# Patient Record
Sex: Female | Born: 1980 | Race: White | Hispanic: No | Marital: Married | Smoking: Former smoker
Health system: Southern US, Community
[De-identification: ages and names within clinical notes are randomized; demographics above are authoritative.]

## PROBLEM LIST (undated history)

## (undated) DIAGNOSIS — D649 Anemia, unspecified: Secondary | ICD-10-CM

## (undated) DIAGNOSIS — I2699 Other pulmonary embolism without acute cor pulmonale: Secondary | ICD-10-CM

## (undated) DIAGNOSIS — K7460 Unspecified cirrhosis of liver: Secondary | ICD-10-CM

## (undated) DIAGNOSIS — Z9889 Other specified postprocedural states: Secondary | ICD-10-CM

## (undated) DIAGNOSIS — R112 Nausea with vomiting, unspecified: Secondary | ICD-10-CM

## (undated) DIAGNOSIS — I82409 Acute embolism and thrombosis of unspecified deep veins of unspecified lower extremity: Secondary | ICD-10-CM

## (undated) DIAGNOSIS — G629 Polyneuropathy, unspecified: Secondary | ICD-10-CM

## (undated) DIAGNOSIS — F410 Panic disorder [episodic paroxysmal anxiety] without agoraphobia: Secondary | ICD-10-CM

## (undated) DIAGNOSIS — E611 Iron deficiency: Secondary | ICD-10-CM

## (undated) HISTORY — PX: SINUS IRRIGATION: SHX2411

## (undated) HISTORY — PX: ABDOMINAL HYSTERECTOMY: SHX81

## (undated) HISTORY — PX: CHOLECYSTECTOMY: SHX55

## (undated) HISTORY — PX: OTHER SURGICAL HISTORY: SHX169

## (undated) HISTORY — PX: TONSILLECTOMY: SUR1361

## (undated) HISTORY — PX: TUBAL LIGATION: SHX77

## (undated) HISTORY — PX: NASAL SINUS SURGERY: SHX719

---

## 2007-04-07 IMAGING — CR DG CHEST 2V
2 series · 2 of 2 positions shown · non-contrast
Comparison: None

CLINICAL DATA: Short of breath, cough, posterior chest pain for 2
days, smoking history

CHEST - 2 VIEW

[view not recorded (1 of 2)]
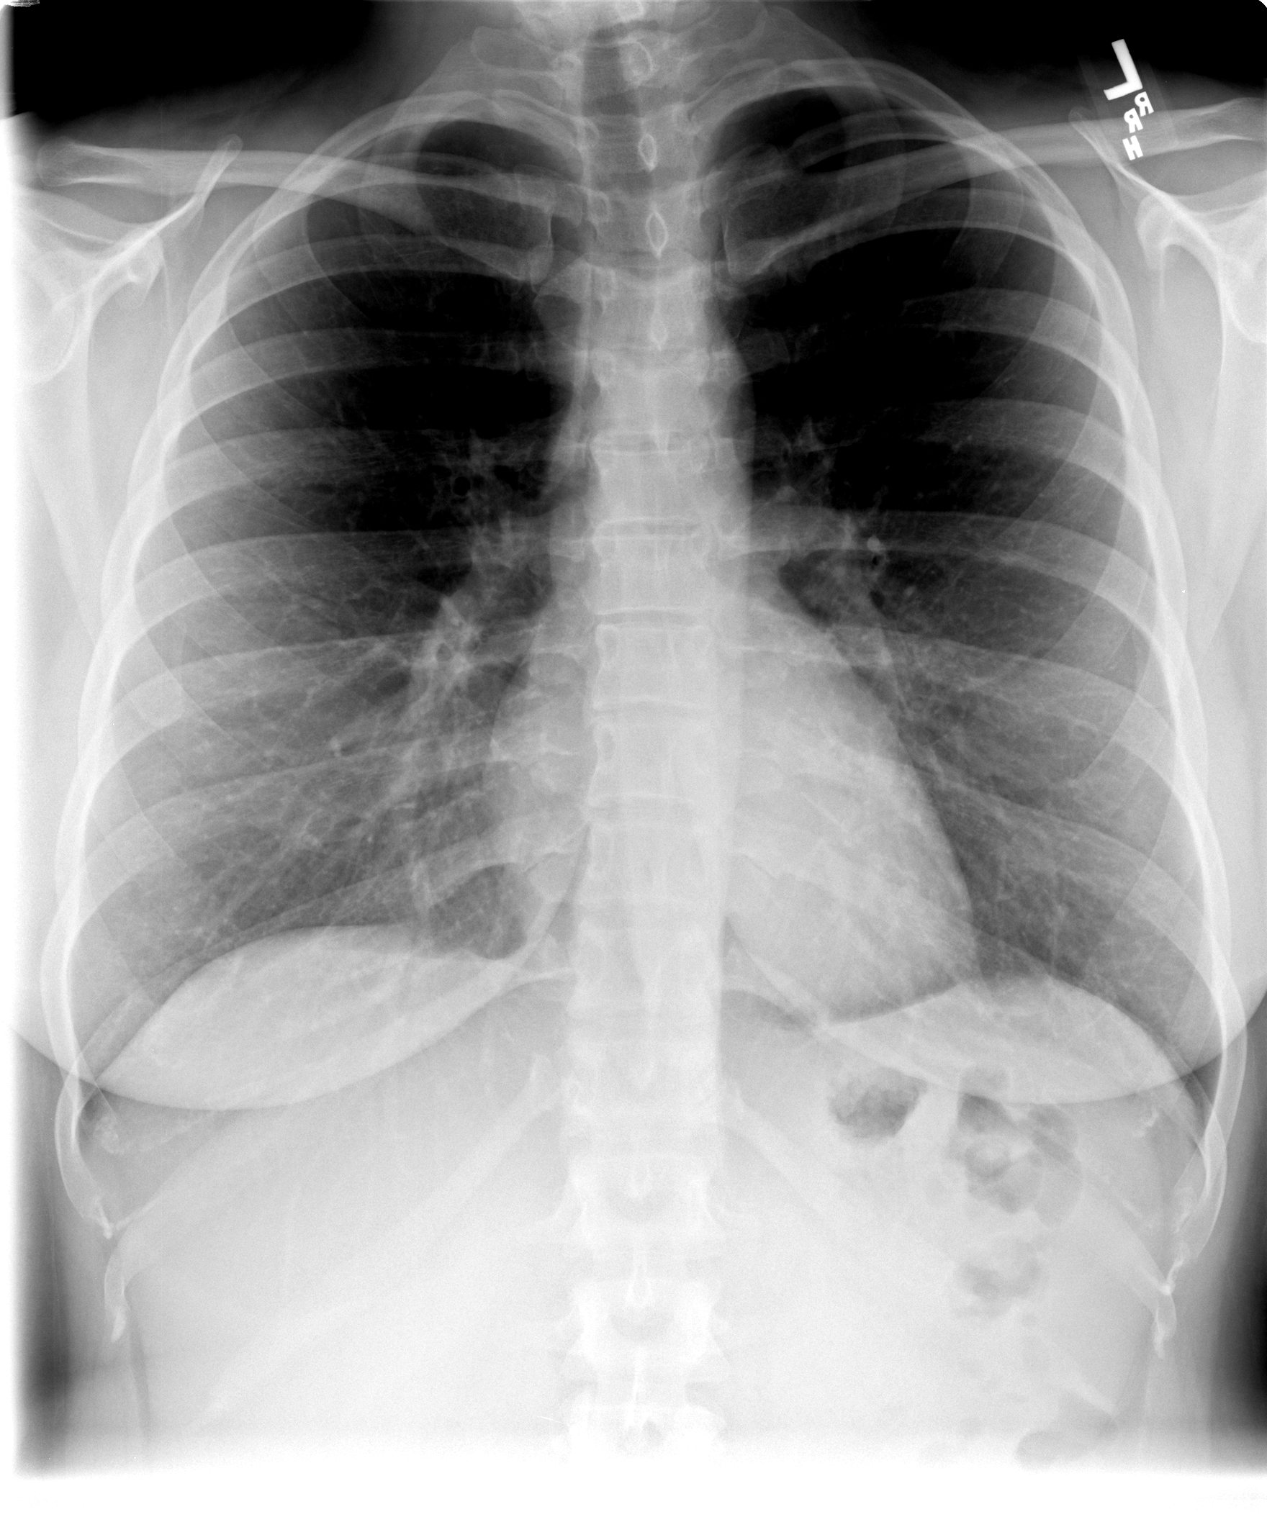

[view not recorded (2 of 2)]
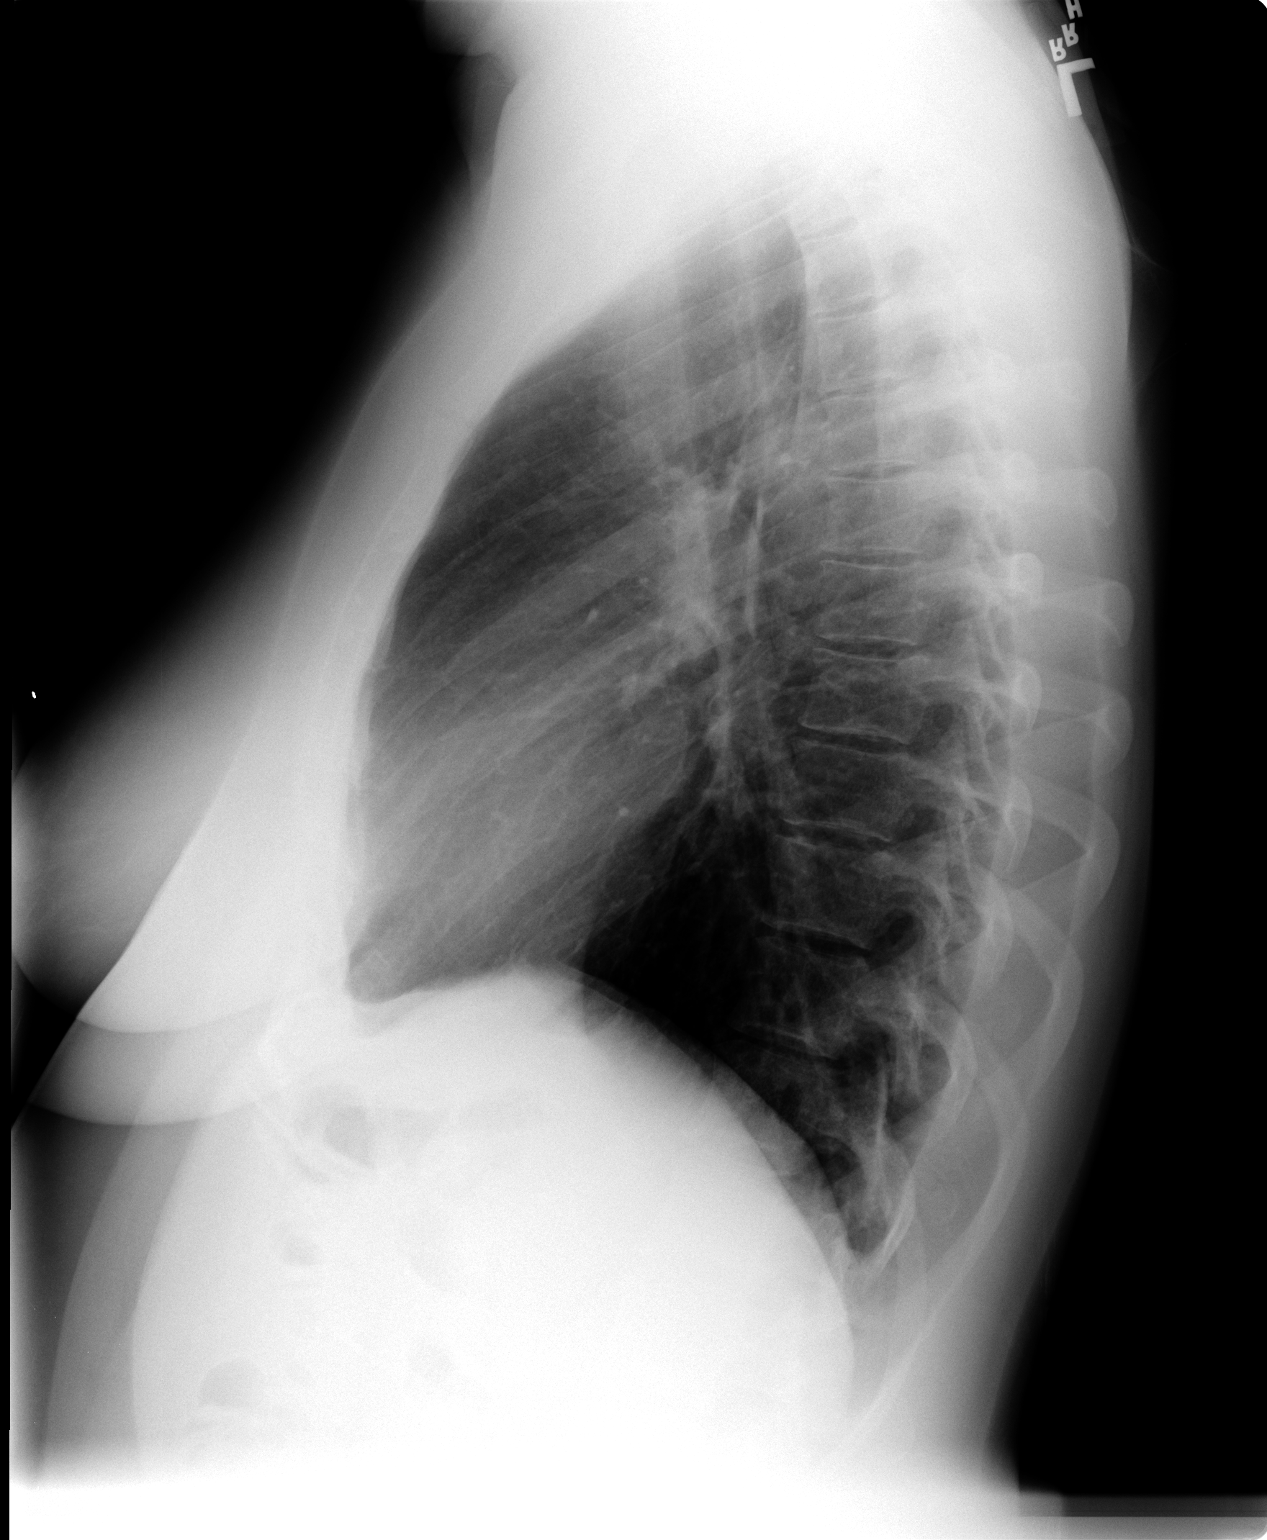

[2 of 2 positions shown; findings below may reference images not displayed]

FINDINGS: The lungs are clear.  The heart is within normal limits
in size.  No bony abnormality is seen.
IMPRESSION: No active lung disease.

## 2007-11-11 ENCOUNTER — Emergency Department (HOSPITAL_COMMUNITY): Admission: EM | Admit: 2007-11-11 | Discharge: 2007-11-11 | Payer: Self-pay | Admitting: Emergency Medicine

## 2008-07-26 ENCOUNTER — Emergency Department (HOSPITAL_COMMUNITY): Admission: EM | Admit: 2008-07-26 | Discharge: 2008-07-26 | Payer: Self-pay | Admitting: Emergency Medicine

## 2008-11-09 ENCOUNTER — Emergency Department (HOSPITAL_COMMUNITY): Admission: EM | Admit: 2008-11-09 | Discharge: 2008-11-10 | Payer: Self-pay | Admitting: Emergency Medicine
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
## 2008-11-23 ENCOUNTER — Ambulatory Visit (HOSPITAL_BASED_OUTPATIENT_CLINIC_OR_DEPARTMENT_OTHER): Admission: RE | Admit: 2008-11-23 | Discharge: 2008-11-23 | Payer: Self-pay | Admitting: Orthopedic Surgery

## 2008-11-29 ENCOUNTER — Encounter: Admission: RE | Admit: 2008-11-29 | Discharge: 2009-01-25 | Payer: Self-pay | Admitting: Orthopedic Surgery

## 2008-12-02 ENCOUNTER — Ambulatory Visit: Admission: RE | Admit: 2008-12-02 | Discharge: 2008-12-02 | Payer: Self-pay | Admitting: Orthopedic Surgery

## 2010-05-03 LAB — CBC
Hemoglobin: 16.1 g/dL — ABNORMAL HIGH (ref 12.0–15.0)
Platelets: 327 10*3/uL (ref 150–400)
RDW: 12.6 % (ref 11.5–15.5)

## 2010-05-03 LAB — DIFFERENTIAL
Basophils Absolute: 0.1 10*3/uL (ref 0.0–0.1)
Basophils Relative: 1 % (ref 0–1)
Eosinophils Absolute: 0.1 10*3/uL (ref 0.0–0.7)
Eosinophils Relative: 1 % (ref 0–5)
Lymphocytes Relative: 23 % (ref 12–46)
Lymphs Abs: 2.6 10*3/uL (ref 0.7–4.0)
Monocytes Absolute: 0.5 10*3/uL (ref 0.1–1.0)
Monocytes Relative: 5 % (ref 3–12)
Neutro Abs: 7.8 10*3/uL — ABNORMAL HIGH (ref 1.7–7.7)
Neutrophils Relative %: 71 % (ref 43–77)

## 2010-05-03 LAB — RAPID URINE DRUG SCREEN, HOSP PERFORMED
Amphetamines: NOT DETECTED
Barbiturates: NOT DETECTED
Benzodiazepines: POSITIVE — AB
Cocaine: NOT DETECTED
Opiates: POSITIVE — AB
Tetrahydrocannabinol: POSITIVE — AB

## 2010-05-03 LAB — COMPREHENSIVE METABOLIC PANEL
ALT: 26 U/L (ref 0–35)
AST: 32 U/L (ref 0–37)
Albumin: 4.9 g/dL (ref 3.5–5.2)
Alkaline Phosphatase: 51 U/L (ref 39–117)
BUN: 3 mg/dL — ABNORMAL LOW (ref 6–23)
CO2: 27 mEq/L (ref 19–32)
Calcium: 9.3 mg/dL (ref 8.4–10.5)
Chloride: 108 mEq/L (ref 96–112)
Creatinine, Ser: 0.67 mg/dL (ref 0.4–1.2)
GFR calc Af Amer: >60 mL/min (ref >60)
GFR calc non Af Amer: >60 mL/min (ref >60)
Glucose, Bld: 109 mg/dL — ABNORMAL HIGH (ref 70–99)
Potassium: 3.5 mEq/L (ref 3.5–5.1)
Sodium: 142 mEq/L (ref 135–145)
Total Bilirubin: 0.7 mg/dL (ref 0.3–1.2)
Total Protein: 7.7 g/dL (ref 6.0–8.3)

## 2010-05-03 LAB — URINALYSIS, ROUTINE W REFLEX MICROSCOPIC
Nitrite: NEGATIVE
Protein, ur: NEGATIVE mg/dL
Specific Gravity, Urine: 1.01 (ref 1.005–1.030)
pH: 7 (ref 5.0–8.0)

## 2010-05-03 LAB — POCT HEMOGLOBIN-HEMACUE: Hemoglobin: 14.4 g/dL (ref 12.0–15.0)

## 2010-05-03 LAB — PREGNANCY, URINE: Preg Test, Ur: NEGATIVE

## 2010-05-03 LAB — ETHANOL: Alcohol, Ethyl (B): 201 mg/dL — ABNORMAL HIGH (ref 0–10)

## 2010-05-07 LAB — RAPID STREP SCREEN (MED CTR MEBANE ONLY): Streptococcus, Group A Screen (Direct): NEGATIVE

## 2010-10-12 ENCOUNTER — Emergency Department (HOSPITAL_COMMUNITY)
Admission: EM | Admit: 2010-10-12 | Discharge: 2010-10-12 | Disposition: A | Discharge status: Home / Self Care | Payer: Medicaid Other | Attending: Emergency Medicine | Admitting: Emergency Medicine

## 2010-10-12 ENCOUNTER — Encounter: Payer: Self-pay | Admitting: Emergency Medicine

## 2010-10-12 DIAGNOSIS — F17200 Nicotine dependence, unspecified, uncomplicated: Secondary | ICD-10-CM | POA: Insufficient documentation

## 2010-10-12 DIAGNOSIS — J40 Bronchitis, not specified as acute or chronic: Secondary | ICD-10-CM | POA: Insufficient documentation

## 2010-10-12 MED ORDER — AZITHROMYCIN 250 MG PO TABS: 250.0000 mg | ORAL_TABLET | Freq: Every day | ORAL | Status: AC

## 2010-10-12 NOTE — ED Notes (Signed)
Patient c/o cough, congestion, irritated throat, and generalized body aches that started yesterday afternoon. Denies any fevers. Nonproductive cough. Per patient yellow-greenish nasal drainage.

## 2010-10-12 NOTE — ED Notes (Signed)
C/o nasal congestion, scratchy throat and body aches onset yesterday

## 2010-10-12 NOTE — ED Provider Notes (Signed)
History   Scribed for Benny Lennert, MD, the patient was seen in room APA19/APA19. This chart was scribed by Clarita Crane. This patient's care was started at 1:36PM.  CSN: [id number] Arrival date & time: 10/12/2010 12:02 PM  Chief Complaint  Patient presents with  . Cough    congestion, generalized body aches   HPI Erica Banks is a 30 y.o. female who presents to the Emergency Department complaining of productive cough with green sputum onset this morning and persistent since with associated nasal congestion and chest tightness which is worse with coughing. Notes cough is aggravated and relieved by nothing. Patient also notes onset of weakness, myalgia and sore throat yesterday which is worse today. Denies sinus pressure, HA, n/v/d, abdominal pain.   HPI ELEMENTS:  Onset: this morning Duration: persistent since onset  Timing: constant  Quality: productive of green sputum   Modifying factors: aggravated and relieved by nothing  Context:  as above  Associated symptoms: +weakness, chest tightness, nasal congestion, myalgia, sore throat  Denies sinus pressure, HA, n/v/d, abdominal pain.   PAST MEDICAL HISTORY:  History reviewed. No pertinent past medical history.  PAST SURGICAL HISTORY:  Past Surgical History  Procedure Date  . Sinus irrigation   . Tonsillectomy   . Tubal ligation   . Nasal sinus surgery     FAMILY HISTORY:  Family History  Problem Relation Age of Onset  . Diabetes Mother   . Hypertension Mother   . COPD Mother   . Diabetes Sister   . Hypertension Sister   . Cancer Other      SOCIAL HISTORY: History   Social History  . Marital Status: Married    Spouse Name: N/A    Number of Children: N/A  . Years of Education: N/A   Social History Main Topics  . Smoking status: Current Everyday Smoker -- 1.0 packs/day for 12 years    Types: Cigarettes  . Smokeless tobacco: None  . Alcohol Use: 0.0 oz/week    4-5 Cans of beer per week  . Drug Use: No  .  Sexually Active: Yes    Birth Control/ Protection: Surgical   Other Topics Concern  . None   Social History Narrative  . None    OB History    Grav Para Term Preterm Abortions TAB SAB Ect Mult Living   2 2 2       2       Review of Systems  Constitutional: Negative for fatigue.  HENT: Positive for congestion and sore throat. Negative for sinus pressure and ear discharge.   Eyes: Negative for discharge.  Respiratory: Positive for cough and chest tightness.   Cardiovascular: Negative for chest pain.  Gastrointestinal: Negative for abdominal pain and diarrhea.  Genitourinary: Negative for frequency and hematuria.  Musculoskeletal: Positive for myalgias. Negative for back pain.  Skin: Negative for rash.  Neurological: Positive for weakness. Negative for seizures and headaches.  Hematological: Negative.   Psychiatric/Behavioral: Negative for hallucinations.    Allergies  Demerol  Home Medications  No current outpatient prescriptions on file.  Physical Exam    BP 124/70  Pulse 84  Temp(Src) 98.6 F (37 C) (Oral)  Resp 18  Ht 5\' 6"  (1.676 m)  Wt 176 lb (79.833 kg)  BMI 28.41 kg/m2  SpO2 100%  LMP 10/12/2010  Physical Exam  Nursing note and vitals reviewed. Constitutional: She is oriented to person, place, and time. She appears well-developed and well-nourished. No distress.  HENT:  Head: Normocephalic  and atraumatic.  Mouth/Throat: Oropharynx is clear and moist.       No sinus tenderness.   Eyes: Conjunctivae and EOM are normal. No scleral icterus.  Neck: Neck supple. No thyromegaly present.  Cardiovascular: Normal rate, regular rhythm and intact distal pulses.  Exam reveals no gallop and no friction rub.   No murmur heard. Pulmonary/Chest: Effort normal and breath sounds normal. She has no wheezes. She has no rales. She exhibits no tenderness.  Abdominal: Soft. Bowel sounds are normal. She exhibits no distension. There is no tenderness. There is no rebound.    Musculoskeletal: Normal range of motion.  Lymphadenopathy:    She has no cervical adenopathy.  Neurological: She is alert and oriented to person, place, and time.  Skin: Skin is warm and dry.  Psychiatric: She has a normal mood and affect. Her behavior is normal.    ED Course  Procedures  OTHER DATA REVIEWED: Nursing notes, vital signs, and past medical records reviewed. Lab results reviewed and considered Imaging results reviewed and considered  DIAGNOSTIC STUDIES: Oxygen Saturation is 100% on room air, normal by my interpretation.    LABS / RADIOLOGY: Results for orders placed during the hospital encounter of 11/23/08  POCT HEMOGLOBIN-HEMACUE      Component Value Range   Hemoglobin 14.4  12.0 - 15.0 (g/dL)   No results found.   ED COURSE / COORDINATION OF CARE: No orders of the defined types were placed in this encounter.    MDM:    PLAN: Discharge Home The patient is to return the emergency department if there is any worsening of symptoms. I have reviewed the discharge instructions with the patient/family  CONDITION ON DISCHARGE: Good  MEDICATIONS GIVEN IN THE E.D. Medications - No data to display    The chart was scribed for me under my direct supervision.  I personally performed the history, physical, and medical decision making and all procedures in the evaluation of this patient.Benny Lennert, MD 10/12/10 1346

## 2011-01-04 ENCOUNTER — Emergency Department (HOSPITAL_COMMUNITY)
Admission: EM | Admit: 2011-01-04 | Discharge: 2011-01-04 | Disposition: A | Discharge status: Home / Self Care | Payer: Medicaid Other | Attending: Emergency Medicine | Admitting: Emergency Medicine

## 2011-01-04 ENCOUNTER — Encounter (HOSPITAL_COMMUNITY): Payer: Self-pay

## 2011-01-04 DIAGNOSIS — IMO0002 Reserved for concepts with insufficient information to code with codable children: Secondary | ICD-10-CM | POA: Insufficient documentation

## 2011-01-04 DIAGNOSIS — L02411 Cutaneous abscess of right axilla: Secondary | ICD-10-CM

## 2011-01-04 DIAGNOSIS — F17200 Nicotine dependence, unspecified, uncomplicated: Secondary | ICD-10-CM | POA: Insufficient documentation

## 2011-01-04 MED ORDER — DOXYCYCLINE HYCLATE 100 MG PO TABS: 100.0000 mg | ORAL_TABLET | Freq: Two times a day (BID) | ORAL | Status: AC

## 2011-01-04 MED ORDER — LIDOCAINE-EPINEPHRINE (PF) 1 %-1:200000 IJ SOLN
INTRAMUSCULAR | Status: AC
  Administered 2011-01-04: 23:00:00
  Filled 2011-01-04: qty 10

## 2011-01-04 NOTE — ED Provider Notes (Signed)
History     CSN: [id number] Arrival date & time: 01/04/2011  8:38 PM   First MD Initiated Contact with Patient 01/04/11 2107      Chief Complaint  Patient presents with  . Wound Check   HPI: Pt is a 30 year old female who has had a right axilla cyst for several months.  She was seen by her regular physician and told just to watch it.  She was also told that if it got bigger or started getting red to come to the ER.  For the past two days she has been having increasing swelling in the right axilla along with some redness.  She denies any systemic symptoms, fevers/chills, emesis, abd pain, or limitation of motion.  History reviewed. No pertinent past medical history.  Past Surgical History  Procedure Date  . Sinus irrigation   . Tonsillectomy   . Tubal ligation   . Nasal sinus surgery     Family History  Problem Relation Age of Onset  . Diabetes Mother   . Hypertension Mother   . COPD Mother   . Diabetes Sister   . Hypertension Sister   . Cancer Other     History  Substance Use Topics  . Smoking status: Current Everyday Smoker -- 1.0 packs/day for 12 years    Types: Cigarettes  . Smokeless tobacco: Not on file  . Alcohol Use: 0.0 oz/week    4-5 Cans of beer per week     occ    OB History    Grav Para Term Preterm Abortions TAB SAB Ect Mult Living   2 2 2       2       Review of Systems  Constitutional: Negative.   HENT: Negative.   Eyes: Negative.   Respiratory: Negative.   Cardiovascular: Negative.   Gastrointestinal: Negative.   Genitourinary: Negative.   Musculoskeletal:       Right axillary pain per HPI  Neurological: Negative.   Hematological: Negative.     Allergies  Demerol  Home Medications  No current outpatient prescriptions on file.  BP 139/80  Pulse 73  Temp(Src) 98.7 F (37.1 C) (Oral)  Resp 20  Ht 5\' 6"  (1.676 m)  Wt 185 lb (83.915 kg)  BMI 29.86 kg/m2  SpO2 100%  LMP 12/23/2010  Physical Exam  Constitutional: She is oriented  to person, place, and time. She appears well-developed and well-nourished. No distress.  HENT:  Head: Normocephalic and atraumatic.  Eyes: Conjunctivae and EOM are normal.  Neck: Normal range of motion. Neck supple.  Cardiovascular: Normal rate and normal heart sounds.   Pulmonary/Chest: Effort normal and breath sounds normal.  Abdominal: Soft. Bowel sounds are normal.  Musculoskeletal: Normal range of motion.       Right axilla has an area of fluctuance approximately 1x2cm.  There is some redness surrounding the area along with several palpable lymph nodes.  No streaking up arm.  No limitation in movement.  Neurological: She is alert and oriented to person, place, and time.  Skin: Skin is warm and dry.    ED Course  INCISION AND DRAINAGE Date/Time: 01/04/2011 11:00 PM Performed by: Majel Homer Authorized by: Majel Homer Consent: Verbal consent obtained. Written consent not obtained. Risks and benefits: risks, benefits and alternatives were discussed Consent given by: patient Patient understanding: patient states understanding of the procedure being performed Patient consent: the patient's understanding of the procedure matches consent given Procedure consent: procedure consent matches procedure scheduled Relevant documents:  relevant documents present and verified Test results: test results available and properly labeled Site marked: the operative site was marked Imaging studies: imaging studies not available Patient identity confirmed: verbally with patient and arm band Time out: Immediately prior to procedure a "time out" was called to verify the correct patient, procedure, equipment, support staff and site/side marked as required. Type: abscess Location: right axilla. Anesthesia: local infiltration Local anesthetic: lidocaine 1% with epinephrine Anesthetic total: 5 ml Patient sedated: no Scalpel size: 11 Needle gauge: 22 Incision type: single straight Drainage:  purulent Drainage amount: moderate Wound treatment: wound left open Packing material: 1/4 in gauze Patient tolerance: Patient tolerated the procedure well with no immediate complications.   (including critical care time)  Labs Reviewed - No data to display No results found.   No diagnosis found.    MDM  I+D of right axillary abscess.  Wound packed.  Will send home with oral Abx for 10 days.        Majel Homer, MD 01/04/11 [phone]

## 2011-01-04 NOTE — ED Provider Notes (Signed)
  I performed a history and physical examination of Erica Banks and discussed her management with Dr. Louanne Belton.  I agree with the history, physical, assessment, and plan of care, with the following exceptions: None  I was present for the following procedures: None Time Spent in Critical Care of the patient: None Time spent in discussions with the patient and family: 5 min.  [name], MD 01/04/11 [phone]

## 2011-01-04 NOTE — ED Notes (Signed)
abcess to right axilla x6 months, more painful and swollen x2 days, has been told needed surgical removal

## 2011-01-04 NOTE — ED Provider Notes (Deleted)
History     CSN: [id number] Arrival date & time: 01/04/2011  8:38 PM   First MD Initiated Contact with Patient 01/04/11 2107      Chief Complaint  Patient presents with  . Wound Check    (Consider location/radiation/quality/duration/timing/severity/associated sxs/prior treatment) HPI  History reviewed. No pertinent past medical history.  Past Surgical History  Procedure Date  . Sinus irrigation   . Tonsillectomy   . Tubal ligation   . Nasal sinus surgery     Family History  Problem Relation Age of Onset  . Diabetes Mother   . Hypertension Mother   . COPD Mother   . Diabetes Sister   . Hypertension Sister   . Cancer Other     History  Substance Use Topics  . Smoking status: Current Everyday Smoker -- 1.0 packs/day for 12 years    Types: Cigarettes  . Smokeless tobacco: Not on file  . Alcohol Use: 0.0 oz/week    4-5 Cans of beer per week     occ    OB History    Grav Para Term Preterm Abortions TAB SAB Ect Mult Living   2 2 2       2       Review of Systems  Allergies  Demerol  Home Medications  No current outpatient prescriptions on file.  BP 139/80  Pulse 73  Temp(Src) 98.7 F (37.1 C) (Oral)  Resp 20  Ht 5\' 6"  (1.676 m)  Wt 185 lb (83.915 kg)  BMI 29.86 kg/m2  SpO2 100%  LMP 12/23/2010  Physical Exam  ED Course  Procedures (including critical care time)  Labs Reviewed - No data to display No results found.   No diagnosis found.    MDM          Felisa Bonier, MD 01/04/11 [phone]

## 2011-01-04 NOTE — ED Notes (Signed)
abcess rt axilla, present 6 mos.  Seen by MD in [place] 2 weeks ago and told that "maybe it would go away"  Pt says area is getting larger and says she feels there are more areas of abscess there.  Alert, NAD

## 2011-01-04 NOTE — ED Notes (Signed)
I and D to rt axilla by Dr Evelena Asa

## 2011-07-22 ENCOUNTER — Encounter (HOSPITAL_COMMUNITY): Payer: Self-pay

## 2011-07-22 ENCOUNTER — Emergency Department (HOSPITAL_COMMUNITY): Payer: Medicaid Other

## 2011-07-22 ENCOUNTER — Emergency Department (HOSPITAL_COMMUNITY)
Admission: EM | Admit: 2011-07-22 | Discharge: 2011-07-22 | Disposition: A | Discharge status: Home / Self Care | Payer: Medicaid Other | Attending: Emergency Medicine | Admitting: Emergency Medicine

## 2011-07-22 DIAGNOSIS — B9789 Other viral agents as the cause of diseases classified elsewhere: Secondary | ICD-10-CM

## 2011-07-22 DIAGNOSIS — J069 Acute upper respiratory infection, unspecified: Secondary | ICD-10-CM | POA: Insufficient documentation

## 2011-07-22 DIAGNOSIS — F17200 Nicotine dependence, unspecified, uncomplicated: Secondary | ICD-10-CM | POA: Insufficient documentation

## 2011-07-22 IMAGING — CR DG CHEST 2V
2 series · 2 of 2 positions shown · non-contrast
Comparison: Chest x-ray [DATE].

CLINICAL DATA: Chest pain, cough and chills.

CHEST - 2 VIEW

[view not recorded (1 of 2)]
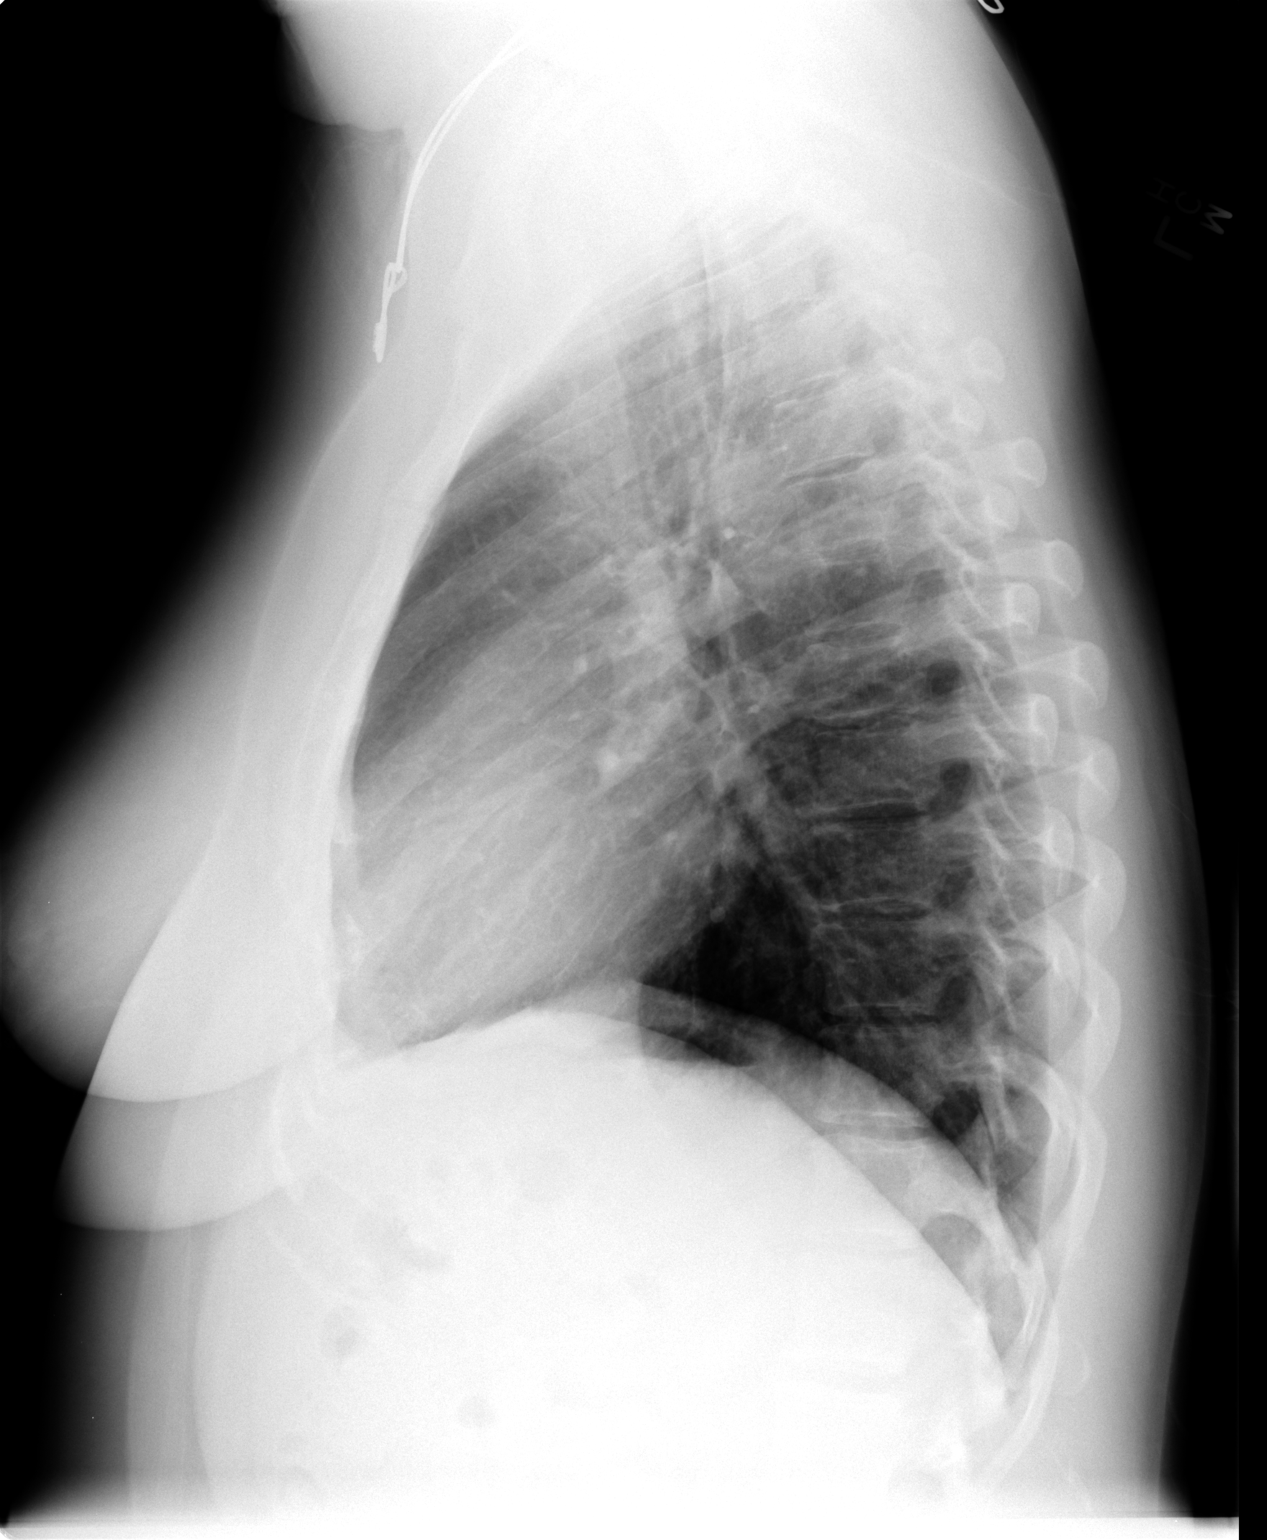

[view not recorded (2 of 2)]
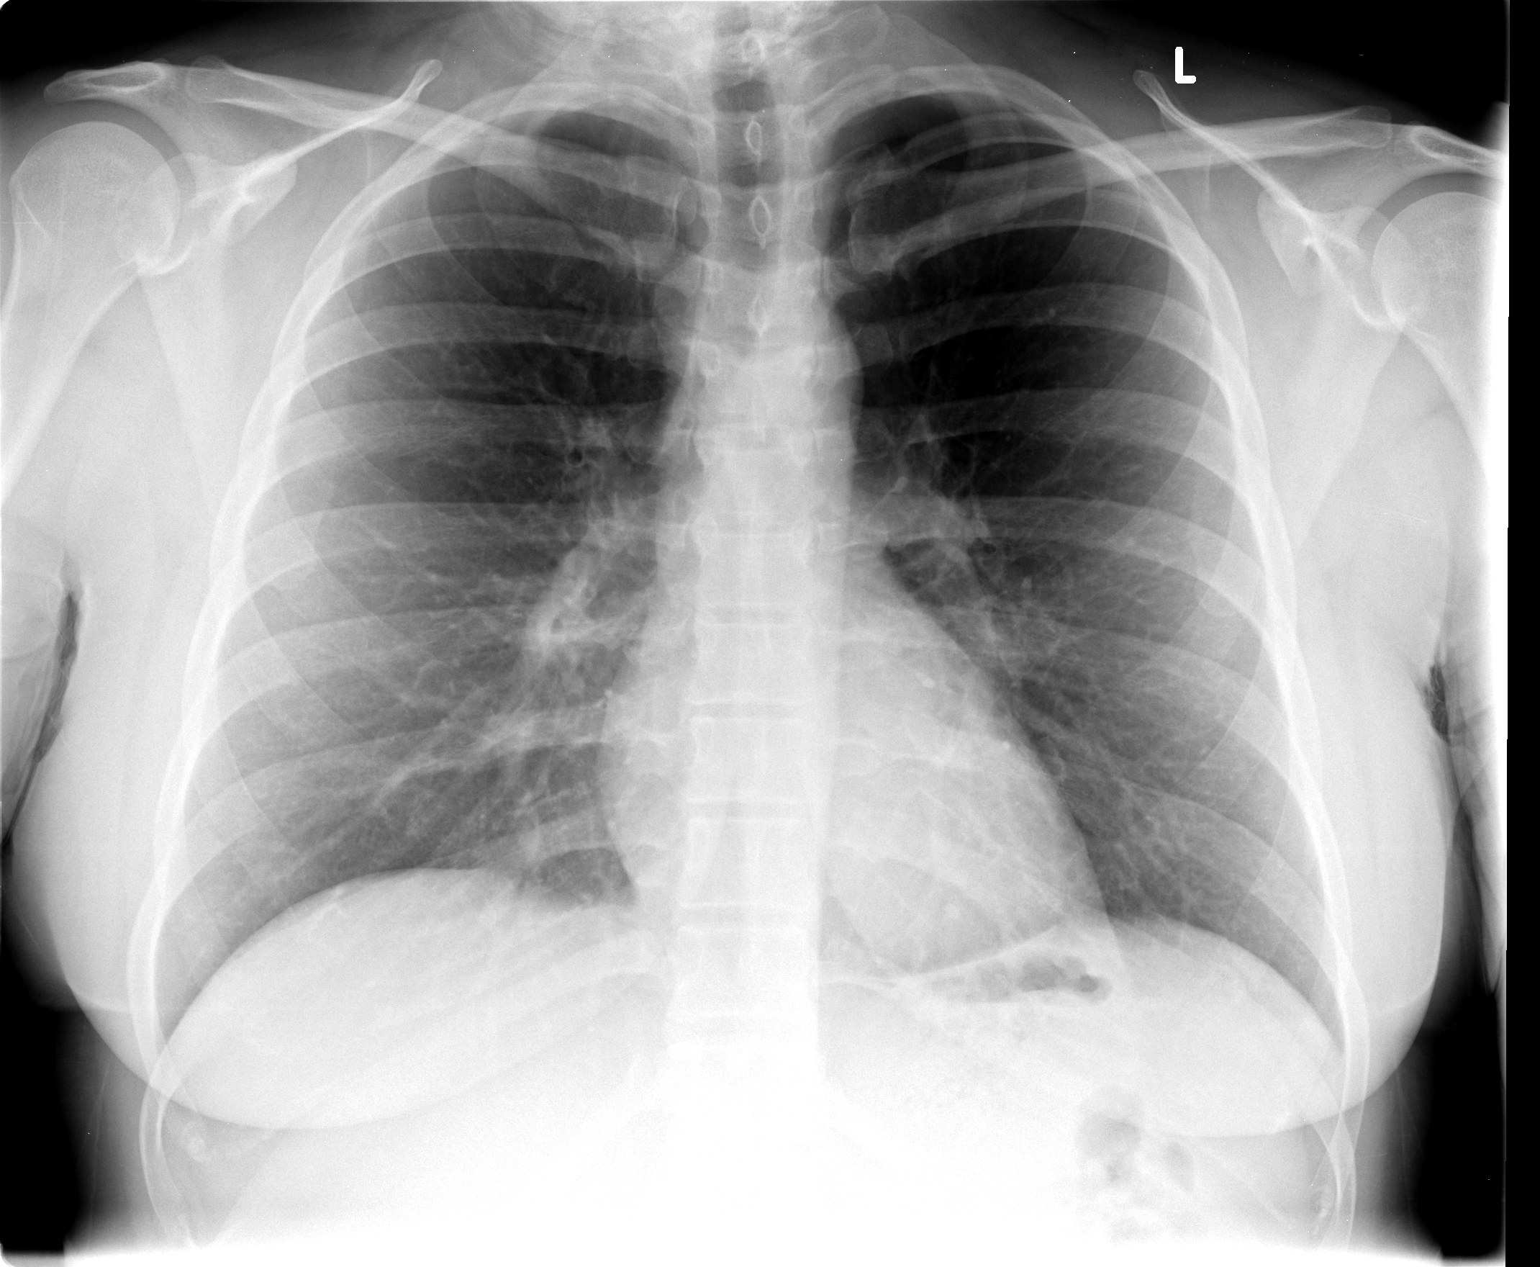

[2 of 2 positions shown; findings below may reference images not displayed]

FINDINGS: Lung volumes are normal.  No consolidative airspace
disease.  No pleural effusions.  No pneumothorax.  No pulmonary
nodule or mass noted.  Pulmonary vasculature and the
cardiomediastinal silhouette are within normal limits.
IMPRESSION: 1. No radiographic evidence of acute cardiopulmonary disease.

## 2011-07-22 MED ORDER — NAPROXEN 500 MG PO TABS: 500.0000 mg | ORAL_TABLET | Freq: Two times a day (BID) | ORAL | Status: DC

## 2011-07-22 MED ORDER — ALBUTEROL SULFATE HFA 108 (90 BASE) MCG/ACT IN AERS: 1.0000 | INHALATION_SPRAY | Freq: Four times a day (QID) | RESPIRATORY_TRACT | Status: DC | PRN

## 2011-07-22 NOTE — ED Notes (Signed)
Pt c/o couch, fever, chills, and body aches that started the previous friday

## 2011-07-22 NOTE — ED Provider Notes (Signed)
History  This chart was scribed for Shelda Jakes, MD by Bennett Scrape. This patient was seen in room APA19/APA19 and the patient's care was started at 7:18AM.  CSN: [id number]  Arrival date & time 07/22/11  0706   First MD Initiated Contact with Patient 07/22/11 [phone]      Chief Complaint  Patient presents with  . Cough  . Chills    Patient is a 31 y.o. female presenting with cough. The history is provided by the patient. No language interpreter was used.  Cough This is a new problem. The current episode started more than 2 days ago. The problem occurs every few minutes. The problem has been gradually worsening. The cough is non-productive. The fever has been present for 1 to 2 days. Associated symptoms include chills, headaches and myalgias. Pertinent negatives include no chest pain, no sore throat and no shortness of breath. She has tried nothing for the symptoms. She is a smoker. Her past medical history is significant for pneumonia.    Erica Banks is a 31 y.o. female who presents to the Emergency Department complaining of 3 days of gradual onset, gradually worsening, constant non-productive cough with associated chills, fever, HA and myalgias. Fever was unmeasured at home. Pt does not have a fever in the ED. She reports that the myalgias are worse with deep breathing and movement. She denies taking OTC medications at home to improve symptoms. She denies having sick contacts with similar symptoms. She reports experiencing prior episodes of similar symptoms when she was diagnosed with PNA. She denies visual disturbance, rash, dysuria, sore throat, nausea, emesis and diarrhea as associated symptoms. She does not have a h/o chronic medical conditions. She is a current everyday smoker and occasional alcohol user.  Dr. Loney Hering is PCP.  History reviewed. No pertinent past medical history.  Past Surgical History  Procedure Date  . Sinus irrigation   . Tonsillectomy   . Tubal  ligation   . Nasal sinus surgery     Family History  Problem Relation Age of Onset  . Diabetes Mother   . Hypertension Mother   . COPD Mother   . Diabetes Sister   . Hypertension Sister   . Cancer Other     History  Substance Use Topics  . Smoking status: Current Everyday Smoker -- 1.0 packs/day for 12 years    Types: Cigarettes  . Smokeless tobacco: Not on file  . Alcohol Use: 0.0 oz/week    4-5 Cans of beer per week     occ    OB History    Grav Para Term Preterm Abortions TAB SAB Ect Mult Living   2 2 2       2       Review of Systems  Constitutional: Positive for fever and chills.  HENT: Negative for congestion, sore throat, neck pain and sinus pressure.   Eyes: Negative for visual disturbance.  Respiratory: Positive for cough. Negative for shortness of breath.   Cardiovascular: Negative for chest pain.  Gastrointestinal: Negative for nausea, vomiting, abdominal pain and diarrhea.  Genitourinary: Negative for dysuria.  Musculoskeletal: Positive for myalgias.  Skin: Negative for rash.  Neurological: Positive for headaches.  Hematological: Does not bruise/bleed easily.  Psychiatric/Behavioral: Negative for confusion.    Allergies  Demerol  Home Medications   Current Outpatient Rx  Name Route Sig Dispense Refill  . ALBUTEROL SULFATE HFA 108 (90 BASE) MCG/ACT IN AERS Inhalation Inhale 1-2 puffs into the lungs every 6 (six) hours  as needed for wheezing. 1 Inhaler 0  . NAPROXEN 500 MG PO TABS Oral Take 1 tablet (500 mg total) by mouth 2 (two) times daily. 14 tablet 0    Triage Vitals: BP 131/78  Pulse 86  Temp 98.2 F (36.8 C) (Oral)  Resp 16  Ht 5\' 6"  (1.676 m)  Wt 170 lb (77.111 kg)  BMI 27.44 kg/m2  SpO2 98%  Physical Exam  Nursing note and vitals reviewed. Constitutional: She is oriented to person, place, and time. She appears well-developed and well-nourished. No distress.  HENT:  Head: Normocephalic and atraumatic.  Mouth/Throat: Oropharynx is  clear and moist.  Eyes: EOM are normal.  Neck: Neck supple. No tracheal deviation present.  Cardiovascular: Normal rate and regular rhythm.   No murmur heard. Pulmonary/Chest: Effort normal and breath sounds normal. No respiratory distress. She has no wheezes.  Abdominal: Soft. Bowel sounds are normal. There is no tenderness.  Musculoskeletal: Normal range of motion.       Pt moves all extremities   Lymphadenopathy:    She has no cervical adenopathy.  Neurological: She is alert and oriented to person, place, and time.  Skin: Skin is warm and dry.  Psychiatric: She has a normal mood and affect. Her behavior is normal.    ED Course  Procedures (including critical care time)  DIAGNOSTIC STUDIES: Oxygen Saturation is 98% on room air, normal by my interpretation.    COORDINATION OF CARE: 7:48AM-Discussed treatment plan which includes a chest x-ray with pt and pt agreed to plan.   Labs Reviewed - No data to display Dg Chest 2 View  07/22/2011  *RADIOLOGY REPORT*  Clinical Data: Chest pain, cough and chills.  CHEST - 2 VIEW  Comparison: Chest x-ray 11/10/2010.  Findings: Lung volumes are normal.  No consolidative airspace disease.  No pleural effusions.  No pneumothorax.  No pulmonary nodule or mass noted.  Pulmonary vasculature and the cardiomediastinal silhouette are within normal limits.  IMPRESSION: 1. No radiographic evidence of acute cardiopulmonary disease.  Original Report Authenticated By: Florencia Reasons, M.D.     1. Viral respiratory illness       MDM  Chest x-ray negative for pneumonia suspect a viral upper respiratory infection. Will treat patient with albuterol inhaler and Naprosyn.   I personally performed the services described in this documentation, which was scribed in my presence. The recorded information has been reviewed and considered.       Shelda Jakes, MD 07/22/11 [phone]

## 2011-07-22 NOTE — Discharge Instructions (Signed)
Cough, Adult  A cough is a reflex. It helps you clear your throat and airways. A cough can help heal your body. A cough can last 2 or 3 weeks (acute) or may last more than 8 weeks (chronic). Some common causes of a cough can include an infection, allergy, or a cold. HOME CARE  Only take medicine as told by your doctor.   If given, take your medicines (antibiotics) as told. Finish them even if you start to feel better.   Use a cold steam vaporizer or humidier in your home. This can help loosen thick spit (secretions).   Sleep so you are almost sitting up (semi-upright). Use pillows to do this. This helps reduce coughing.   Rest as needed.   Stop smoking if you smoke.  GET HELP RIGHT AWAY IF:  You have yellowish-white fluid (pus) in your thick spit.   Your cough gets worse.   Your medicine does not reduce coughing, and you are losing sleep.   You cough up blood.   You have trouble breathing.   Your pain gets worse and medicine does not help.   You have a fever.  MAKE SURE YOU:   Understand these instructions.   Will watch your condition.   Will get help right away if you are not doing well or get worse.  Document Released: 09/27/2010 Document Revised: 01/03/2011 Document Reviewed: 09/27/2010 [facility] Patient Information 2012 [place], [place].  Return for new or worse symptoms. Take anti-inflammatories for the bodyaches. Albuterol inhaler will help you with your cough. Followup with your regular Dr. If not improved in a couple days.

## 2012-03-05 ENCOUNTER — Emergency Department (HOSPITAL_COMMUNITY)
Admission: EM | Admit: 2012-03-05 | Discharge: 2012-03-05 | Disposition: A | Discharge status: Home / Self Care | Payer: MEDICAID | Attending: Emergency Medicine | Admitting: Emergency Medicine

## 2012-03-05 ENCOUNTER — Encounter (HOSPITAL_COMMUNITY): Payer: Self-pay | Admitting: *Deleted

## 2012-03-05 DIAGNOSIS — Z3202 Encounter for pregnancy test, result negative: Secondary | ICD-10-CM | POA: Insufficient documentation

## 2012-03-05 DIAGNOSIS — F1010 Alcohol abuse, uncomplicated: Secondary | ICD-10-CM

## 2012-03-05 DIAGNOSIS — F1210 Cannabis abuse, uncomplicated: Secondary | ICD-10-CM | POA: Insufficient documentation

## 2012-03-05 DIAGNOSIS — F17200 Nicotine dependence, unspecified, uncomplicated: Secondary | ICD-10-CM | POA: Insufficient documentation

## 2012-03-05 DIAGNOSIS — R1110 Vomiting, unspecified: Secondary | ICD-10-CM | POA: Insufficient documentation

## 2012-03-05 LAB — RAPID URINE DRUG SCREEN, HOSP PERFORMED
Barbiturates: NOT DETECTED
Cocaine: POSITIVE — AB
Tetrahydrocannabinol: NOT DETECTED

## 2012-03-05 LAB — CBC WITH DIFFERENTIAL/PLATELET
Basophils Relative: 0 % (ref 0–1)
Eosinophils Absolute: 0 10*3/uL (ref 0.0–0.7)
Eosinophils Relative: 0 % (ref 0–5)
HCT: 43.8 % (ref 36.0–46.0)
MCH: 32.8 pg (ref 26.0–34.0)
Neutrophils Relative %: 65 % (ref 43–77)

## 2012-03-05 LAB — URINALYSIS, ROUTINE W REFLEX MICROSCOPIC
Ketones, ur: NEGATIVE mg/dL
Nitrite: NEGATIVE
Urobilinogen, UA: 0.2 mg/dL (ref 0.0–1.0)

## 2012-03-05 LAB — BASIC METABOLIC PANEL
CO2: 25 mEq/L (ref 19–32)
Calcium: 9.7 mg/dL (ref 8.4–10.5)
GFR calc non Af Amer: >90 mL/min (ref >90)
Potassium: 3.3 mEq/L — ABNORMAL LOW (ref 3.5–5.1)
Sodium: 140 mEq/L (ref 135–145)

## 2012-03-05 LAB — URINE MICROSCOPIC-ADD ON

## 2012-03-05 MED ORDER — IBUPROFEN 400 MG PO TABS: 600.0000 mg | ORAL_TABLET | Freq: Three times a day (TID) | ORAL | Status: DC | PRN

## 2012-03-05 MED ORDER — LORAZEPAM 1 MG PO TABS
2.0000 mg | ORAL_TABLET | Freq: Once | ORAL | Status: AC
  Administered 2012-03-05: 2 mg via ORAL
  Filled 2012-03-05: qty 2

## 2012-03-05 MED ORDER — NICOTINE 21 MG/24HR TD PT24
21.0000 mg | MEDICATED_PATCH | Freq: Every day | TRANSDERMAL | Status: DC
  Administered 2012-03-05: 21 mg via TRANSDERMAL
  Filled 2012-03-05: qty 1

## 2012-03-05 MED ORDER — ONDANSETRON HCL 4 MG PO TABS: 4.0000 mg | ORAL_TABLET | Freq: Three times a day (TID) | ORAL | Status: DC | PRN

## 2012-03-05 MED ORDER — LORAZEPAM 1 MG PO TABS
2.0000 mg | ORAL_TABLET | ORAL | Status: DC | PRN
  Administered 2012-03-05 (×2): 2 mg via ORAL
  Filled 2012-03-05 (×2): qty 2

## 2012-03-05 NOTE — BH Assessment (Addendum)
Assessment Note   Erica Banks is an 32 y.o. female.   Presented voluntarily to the ED for assistance with alcohol detox. Pt states that she has been drinking since age 32 and is now drinking a 12-pack to a case of beer and has been drinking like this for the past 4 years. She can go a day without drinking if she has a Xanax, otherwise she feels extremely shaky inside and anxious.  She has smoked cocaine-laced marijuana during the past 2 weeks at a party, but does particularly like cocaine or marijuana. Denies feeling SI right now, but does admit to having thoughts of SI over the past week because of her inability to stop drinking. 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 She started using marijuana at age 28, and uses it only occasionally. Her first use of cocaine use was at age 68 and she rarely uses it. She admits to drinking 2 beers this morning and 2 Michelob Lemonades. She is cooperative and vested in wanting to be detoxed from alcohol and staying off of it.  During this writers assessment pt was drowsy because she had received 2 Ativan in the ED.  Returned to verify some information and she would open her eyes to being shaken, but would not become alert enough to answer questions. Reported same to Dr. Bebe Shaggy.  Pt has been accepted to [facility] for inpatient detox.   Axis I: Substance Induced Mood Disorder Axis II: Deferred Axis III: History reviewed. No pertinent past medical history. Axis IV: problems related to social environment Axis V: 41-50 serious symptoms  Past Medical History: History reviewed. No pertinent past medical history.  Past Surgical History  Procedure Date  . Sinus irrigation   . Tonsillectomy   . Tubal ligation   . Nasal sinus surgery     Family History:  Family History  Problem Relation Age of Onset  . Diabetes Mother   . Hypertension Mother   . COPD Mother   . Diabetes Sister   . Hypertension Sister   . Cancer Other     Social  History:  reports that she has been smoking Cigarettes.  She has a 12 pack-year smoking history. She does not have any smokeless tobacco history on file. She reports that she drinks alcohol. She reports that she uses illicit drugs (Marijuana).  Additional Social History:     CIWA: CIWA-Ar BP: 127/89 mmHg Pulse Rate: 94  Nausea and Vomiting: no nausea and no vomiting Tactile Disturbances: very mild itching, pins and needles, burning or numbness Tremor: two Auditory Disturbances: very mild harshness or ability to frighten Paroxysmal Sweats: barely perceptible sweating, palms moist Visual Disturbances: not present Anxiety: moderately anxious, or guarded, so anxiety is inferred Headache, Fullness in Head: very mild Agitation: two Orientation and Clouding of Sensorium: oriented and can do serial additions CIWA-Ar Total: 12  COWS:    Allergies:  Allergies  Allergen Reactions  . Demerol Anaphylaxis    Per patient cardiac arrest    Home Medications:  (Not in a hospital admission)  OB/GYN Status:  Patient's last menstrual period was 03/04/2012.  General Assessment Data Location of Assessment: [facility] ACT Assessment: Yes Living Arrangements: Spouse/significant other;Children Can pt return to current living arrangement?: Yes Admission Status: Voluntary Is patient capable of signing voluntary admission?: Yes Transfer from: Home Referral Source: Self/Family/Friend  Education Status Is patient currently in school?: No  Risk to self Suicidal Ideation: No Suicidal Intent: No Is patient at risk  for suicide?: Yes (Had some thoughts this week, but denies it today.) Suicidal Plan?: No What has been your use of drugs/alcohol within the last 12 months?: ETOH today Previous Attempts/Gestures: Yes How many times?: 1  Intentional Self Injurious Behavior: None Family Suicide History: No Recent stressful life event(s): Other (Comment) (none noted) Persecutory voices/beliefs?:  No Depression: Yes Depression Symptoms: Despondent;Tearfulness;Loss of interest in usual pleasures Substance abuse history and/or treatment for substance abuse?: Yes Suicide prevention information given to non-admitted patients: Not applicable  Risk to Others Homicidal Ideation: No Thoughts of Harm to Others: No Current Homicidal Intent: No Current Homicidal Plan: No Access to Homicidal Means: No  Psychosis Hallucinations: None noted Delusions: None noted  Mental Status Report Appear/Hygiene: Body odor (alcohol) Eye Contact: Fair (sleepy after receiving 2 Ativan in the ED) Motor Activity: Freedom of movement Speech: Logical/coherent Level of Consciousness: Drowsy Mood: Depressed Affect: Blunted Anxiety Level: Minimal Thought Processes: Coherent;Relevant Judgement: Unimpaired Orientation: Person;Place;Time;Situation Obsessive Compulsive Thoughts/Behaviors: Moderate  Cognitive Functioning Concentration: Decreased Memory: Recent Intact;Remote Intact IQ: Average Insight: Fair Impulse Control: Poor Appetite: Good Sleep: No Change Total Hours of Sleep: 9  Vegetative Symptoms: None  ADLScreening [facility] Assessment Services) Patient's cognitive ability adequate to safely complete daily activities?: Yes Patient able to express need for assistance with ADLs?: Yes Independently performs ADLs?: Yes (appropriate for developmental age)  Abuse/Neglect [facility]) Physical Abuse: Denies Verbal Abuse: Denies Sexual Abuse: Denies  Prior Inpatient Therapy Prior Inpatient Therapy: Yes Prior Therapy Dates: 2004 Prior Therapy Facilty/Provider(s): [facility] Reason for Treatment: OD     ADL Screening (condition at time of admission) Patient's cognitive ability adequate to safely complete daily activities?: Yes Patient able to express need for assistance with ADLs?: Yes Independently performs ADLs?: Yes (appropriate for developmental age)       Abuse/Neglect Assessment  (Assessment to be complete while patient is alone) Physical Abuse: Denies Verbal Abuse: Denies Sexual Abuse: Denies Values / Beliefs Cultural Requests During Hospitalization: None Spiritual Requests During Hospitalization: None        Additional Information 1:1 In Past 12 Months?: No CIRT Risk: No Elopement Risk: No Does patient have medical clearance?: Yes     Disposition:  Disposition Disposition of Patient: Inpatient treatment program Type of inpatient treatment program: Adult  On Site Evaluation by:   Reviewed with Physician:     Erica Banks 03/05/2012 3:14 PM

## 2012-03-05 NOTE — BHH Counselor (Signed)
Erica Banks submitted Pt for admission to [facility]. Thurman Coyer, [facility] confirmed bed availability. Verne Spurr, PA reviewed clinical information and accepted Pt to the service of H. Daleen Bo, MD, room [id number].  Harlin Rain Patsy [place], LPC, [facility] Assessment Counselor

## 2012-03-05 NOTE — ED Notes (Signed)
Pt was wanded by security.  

## 2012-03-05 NOTE — ED Notes (Signed)
Wants help to  Stop drinking alcohol.  Last drank 1030 am.  Has been taking "nerve pills" also

## 2012-03-05 NOTE — ED Notes (Signed)
Pt left w/ family going to [facility]. [facility] called & advised pt was on the way.

## 2012-03-05 NOTE — ED Notes (Signed)
Norberta Keens [phone] (mom)

## 2012-03-05 NOTE — ED Provider Notes (Signed)
History   This chart was scribed for Joya Gaskins, MD, by Frederik Pear, ER scribe. The patient was seen in room [facility]19/[facility]19 and the patient's care was started at 1333.    CSN: [id number]  Arrival date & time 03/05/12  1223   First MD Initiated Contact with Patient 03/05/12 1333      Chief Complaint  Patient presents with  . V70.1    Patient is a 32 y.o. female presenting with intoxication. The history is provided by the patient. No language interpreter was used.  Alcohol Intoxication This is a chronic problem. The current episode started 3 to 5 hours ago. The problem occurs daily. The problem has been gradually worsening. Treatments tried: xanax. The treatment provided mild relief.    Erica Banks is a 32 y.o. female who presents to the Emergency Department complaining of wanting detox from alcohol. She reports that she is a current, every day drink to the past 4 years and on average drinks 18 beers and 2-3 malt beverages a day. She reports that her last drink was at 1030 and she consumed 3 beers and 2 malt beverages. She denies every trying to stop drinking before. She reports associated emesis that began this morning. She denies any emesis, diarrhea, or seizures. She denies any current SI or HI, but reports previously having SI and having a plan. She reports that her withdrawal symptoms are relieved by taking xanax. She also states that she smoked a joint last week that was laced with cocaine.  She has a family h/o of alcoholism. Drinking improves her symptoms Rest worsens her symptoms  PMH - none  Past Surgical History  Procedure Date  . Sinus irrigation   . Tonsillectomy   . Tubal ligation   . Nasal sinus surgery     Family History  Problem Relation Age of Onset  . Diabetes Mother   . Hypertension Mother   . COPD Mother   . Diabetes Sister   . Hypertension Sister   . Cancer Other     History  Substance Use Topics  . Smoking status: Current Every Day Smoker --  1.0 packs/day for 12 years    Types: Cigarettes  . Smokeless tobacco: Not on file  . Alcohol Use: 0.0 oz/week    4-5 Cans of beer per week     Comment: occ    OB History    Grav Para Term Preterm Abortions TAB SAB Ect Mult Living   2 2 2       2       Review of Systems  Gastrointestinal: Positive for vomiting.  Psychiatric/Behavioral: Negative for suicidal ideas.  All other systems reviewed and are negative.    Allergies  Demerol  Home Medications   Current Outpatient Rx  Name  Route  Sig  Dispense  Refill  . ALBUTEROL SULFATE HFA 108 (90 BASE) MCG/ACT IN AERS   Inhalation   Inhale 1-2 puffs into the lungs every 6 (six) hours as needed for wheezing.   1 Inhaler   0   . NAPROXEN 500 MG PO TABS   Oral   Take 1 tablet (500 mg total) by mouth 2 (two) times daily.   14 tablet   0     BP 127/89  Pulse 94  Temp 98 F (36.7 C) (Oral)  Resp 20  Ht 5\' 6"  (1.676 m)  Wt 176 lb (79.833 kg)  BMI 28.41 kg/m2  SpO2 100%  LMP 03/04/2012  Physical Exam CONSTITUTIONAL:  Well developed/well nourished HEAD AND FACE: Normocephalic/atraumatic EYES: EOMI/PERRL ENMT: Mucous membranes dry NECK: supple no meningeal signs SPINE:entire spine nontender CV: S1/S2 noted, no murmurs/rubs/gallops noted LUNGS: Lungs are clear to auscultation bilaterally, no apparent distress ABDOMEN: soft, nontender, no rebound or guarding GU:no cva tenderness NEURO: Pt is awake/alert, moves all extremitiesx4 EXTREMITIES: pulses normal, full ROM SKIN: warm, color normal PSYCH: anxious ED Course  Procedures   DIAGNOSTIC STUDIES: Oxygen Saturation is 100% on room air, normal by my interpretation.    COORDINATION OF CARE:  13:38- Discussed planned course of treatment with the patient, including blood work, UA, Ativan, and consulting the ACT team,  who is agreeable at this time.  13:45- Medication Orders- Lorazepam (Ativan) tablet 2 mg- once.  2:26 PM- D/w ACT, will see patient for evaluation  and management.  Pt denied active SI to me but told nursing that she would overdose.  She is at high risk for alcohol withdrawal, will start ativan.  Results for orders placed during the hospital encounter of 03/05/12  CBC WITH DIFFERENTIAL      Component Value Range   WBC 11.2 (*) 4.0 - 10.5 K/uL   RBC 4.69  3.87 - 5.11 MIL/uL   Hemoglobin 15.4 (*) 12.0 - 15.0 g/dL   HCT 40.9  81.1 - 91.4 %   MCV 93.4  78.0 - 100.0 fL   MCH 32.8  26.0 - 34.0 pg   MCHC 35.2  30.0 - 36.0 g/dL   RDW 78.2  95.6 - 21.3 %   Platelets 354  150 - 400 K/uL   Neutrophils Relative 65  43 - 77 %   Neutro Abs 7.3  1.7 - 7.7 K/uL   Lymphocytes Relative 28  12 - 46 %   Lymphs Abs 3.1  0.7 - 4.0 K/uL   Monocytes Relative 7  3 - 12 %   Monocytes Absolute 0.8  0.1 - 1.0 K/uL   Eosinophils Relative 0  0 - 5 %   Eosinophils Absolute 0.0  0.0 - 0.7 K/uL   Basophils Relative 0  0 - 1 %   Basophils Absolute 0.1  0.0 - 0.1 K/uL  BASIC METABOLIC PANEL      Component Value Range   Sodium 140  135 - 145 mEq/L   Potassium 3.3 (*) 3.5 - 5.1 mEq/L   Chloride 101  96 - 112 mEq/L   CO2 25  19 - 32 mEq/L   Glucose, Bld 86  70 - 99 mg/dL   BUN 3 (*) 6 - 23 mg/dL   Creatinine, Ser 0.86  0.50 - 1.10 mg/dL   Calcium 9.7  8.4 - 57.8 mg/dL   GFR calc non Af Amer >90  >90 mL/min   GFR calc Af Amer >90  >90 mL/min  ETHANOL      Component Value Range   Alcohol, Ethyl (B) 84 (*) 0 - 11 mg/dL  URINALYSIS, ROUTINE W REFLEX MICROSCOPIC      Component Value Range   Color, Urine YELLOW  YELLOW   APPearance CLEAR  CLEAR   Specific Gravity, Urine 1.010  1.005 - 1.030   pH 6.5  5.0 - 8.0   Glucose, UA NEGATIVE  NEGATIVE mg/dL   Hgb urine dipstick SMALL (*) NEGATIVE   Bilirubin Urine NEGATIVE  NEGATIVE   Ketones, ur NEGATIVE  NEGATIVE mg/dL   Protein, ur NEGATIVE  NEGATIVE mg/dL   Urobilinogen, UA 0.2  0.0 - 1.0 mg/dL   Nitrite NEGATIVE  NEGATIVE  Leukocytes, UA TRACE (*) NEGATIVE  URINE RAPID DRUG SCREEN (HOSP PERFORMED)       Component Value Range   Opiates NONE DETECTED  NONE DETECTED   Cocaine POSITIVE (*) NONE DETECTED   Benzodiazepines NONE DETECTED  NONE DETECTED   Amphetamines NONE DETECTED  NONE DETECTED   Tetrahydrocannabinol NONE DETECTED  NONE DETECTED   Barbiturates NONE DETECTED  NONE DETECTED  POCT PREGNANCY, URINE      Component Value Range   Preg Test, Ur NEGATIVE  NEGATIVE  URINE MICROSCOPIC-ADD ON      Component Value Range   Squamous Epithelial / LPF MANY (*) RARE   WBC, UA 11-20  <3 WBC/hpf   RBC / HPF 0-2  <3 RBC/hpf   Bacteria, UA RARE  RARE      Labs Reviewed  CBC WITH DIFFERENTIAL  BASIC METABOLIC PANEL  ETHANOL  URINALYSIS, ROUTINE W REFLEX MICROSCOPIC  URINE RAPID DRUG SCREEN (HOSP PERFORMED)      MDM  Nursing notes including past medical history and social history reviewed and considered in documentation Labs/vital reviewed and considered    I personally performed the services described in this documentation, which was scribed in my presence. The recorded information has been reviewed and is accurate.           Joya Gaskins, MD 03/05/12 1534

## 2012-03-05 NOTE — ED Notes (Signed)
Patient wanting detox from ETOH. Last drink 1030 today. Patient reports her "normal" day with drinking is 2-3 "Locos" and 12 pack of beer. Patient with passive thoughts of suicide, stating she would overdose. Patient cooperative at present, tearful. Has been admitted before for SI/detox at [facility].

## 2013-04-10 ENCOUNTER — Emergency Department (HOSPITAL_COMMUNITY): Payer: Medicaid Other

## 2013-04-10 ENCOUNTER — Encounter (HOSPITAL_COMMUNITY): Payer: Self-pay | Admitting: Emergency Medicine

## 2013-04-10 ENCOUNTER — Emergency Department (HOSPITAL_COMMUNITY)
Admission: EM | Admit: 2013-04-10 | Discharge: 2013-04-10 | Disposition: A | Discharge status: Home / Self Care | Payer: Medicaid Other | Attending: Emergency Medicine | Admitting: Emergency Medicine

## 2013-04-10 DIAGNOSIS — B9689 Other specified bacterial agents as the cause of diseases classified elsewhere: Secondary | ICD-10-CM

## 2013-04-10 DIAGNOSIS — R509 Fever, unspecified: Secondary | ICD-10-CM | POA: Insufficient documentation

## 2013-04-10 DIAGNOSIS — M545 Low back pain, unspecified: Secondary | ICD-10-CM | POA: Insufficient documentation

## 2013-04-10 DIAGNOSIS — Z3202 Encounter for pregnancy test, result negative: Secondary | ICD-10-CM | POA: Insufficient documentation

## 2013-04-10 DIAGNOSIS — R109 Unspecified abdominal pain: Secondary | ICD-10-CM

## 2013-04-10 DIAGNOSIS — N949 Unspecified condition associated with female genital organs and menstrual cycle: Secondary | ICD-10-CM | POA: Insufficient documentation

## 2013-04-10 DIAGNOSIS — N760 Acute vaginitis: Secondary | ICD-10-CM | POA: Insufficient documentation

## 2013-04-10 DIAGNOSIS — F17200 Nicotine dependence, unspecified, uncomplicated: Secondary | ICD-10-CM | POA: Insufficient documentation

## 2013-04-10 DIAGNOSIS — N939 Abnormal uterine and vaginal bleeding, unspecified: Secondary | ICD-10-CM

## 2013-04-10 DIAGNOSIS — Z9089 Acquired absence of other organs: Secondary | ICD-10-CM | POA: Insufficient documentation

## 2013-04-10 DIAGNOSIS — A499 Bacterial infection, unspecified: Secondary | ICD-10-CM | POA: Insufficient documentation

## 2013-04-10 DIAGNOSIS — R1031 Right lower quadrant pain: Secondary | ICD-10-CM | POA: Insufficient documentation

## 2013-04-10 DIAGNOSIS — R112 Nausea with vomiting, unspecified: Secondary | ICD-10-CM | POA: Insufficient documentation

## 2013-04-10 DIAGNOSIS — Z79899 Other long term (current) drug therapy: Secondary | ICD-10-CM | POA: Insufficient documentation

## 2013-04-10 DIAGNOSIS — F410 Panic disorder [episodic paroxysmal anxiety] without agoraphobia: Secondary | ICD-10-CM | POA: Insufficient documentation

## 2013-04-10 HISTORY — DX: Panic disorder (episodic paroxysmal anxiety): F41.0

## 2013-04-10 LAB — CBC WITH DIFFERENTIAL/PLATELET
BASOS ABS: 0 10*3/uL (ref 0.0–0.1)
Basophils Relative: 0 % (ref 0–1)
EOS ABS: 0.1 10*3/uL (ref 0.0–0.7)
EOS PCT: 1 % (ref 0–5)
HCT: 42.7 % (ref 36.0–46.0)
Hemoglobin: 14.8 g/dL (ref 12.0–15.0)
LYMPHS ABS: 1.8 10*3/uL (ref 0.7–4.0)
Lymphocytes Relative: 16 % (ref 12–46)
MCH: 31.5 pg (ref 26.0–34.0)
MCHC: 34.7 g/dL (ref 30.0–36.0)
MCV: 90.9 fL (ref 78.0–100.0)
Monocytes Absolute: 0.8 10*3/uL (ref 0.1–1.0)
Monocytes Relative: 7 % (ref 3–12)
NEUTROS PCT: 75 % (ref 43–77)
Neutro Abs: 8.5 10*3/uL — ABNORMAL HIGH (ref 1.7–7.7)
PLATELETS: 340 10*3/uL (ref 150–400)
RBC: 4.7 MIL/uL (ref 3.87–5.11)
RDW: 12.8 % (ref 11.5–15.5)
WBC: 11.3 10*3/uL — AB (ref 4.0–10.5)

## 2013-04-10 LAB — COMPREHENSIVE METABOLIC PANEL
ALBUMIN: 4 g/dL (ref 3.5–5.2)
ALK PHOS: 83 U/L (ref 39–117)
ALT: 40 U/L — AB (ref 0–35)
AST: 30 U/L (ref 0–37)
BUN: 7 mg/dL (ref 6–23)
CALCIUM: 9.2 mg/dL (ref 8.4–10.5)
CO2: 27 mEq/L (ref 19–32)
Chloride: 103 mEq/L (ref 96–112)
Creatinine, Ser: 0.85 mg/dL (ref 0.50–1.10)
GFR calc Af Amer: >90 mL/min (ref >90)
GFR calc non Af Amer: >90 mL/min (ref >90)
Glucose, Bld: 120 mg/dL — ABNORMAL HIGH (ref 70–99)
POTASSIUM: 3.7 meq/L (ref 3.7–5.3)
SODIUM: 139 meq/L (ref 137–147)
TOTAL PROTEIN: 7.5 g/dL (ref 6.0–8.3)
Total Bilirubin: 0.6 mg/dL (ref 0.3–1.2)

## 2013-04-10 LAB — URINALYSIS, ROUTINE W REFLEX MICROSCOPIC
BILIRUBIN URINE: NEGATIVE
Glucose, UA: NEGATIVE mg/dL
Hgb urine dipstick: NEGATIVE
KETONES UR: NEGATIVE mg/dL
LEUKOCYTES UA: NEGATIVE
NITRITE: NEGATIVE
PH: 5.5 (ref 5.0–8.0)
Protein, ur: NEGATIVE mg/dL
UROBILINOGEN UA: 0.2 mg/dL (ref 0.0–1.0)

## 2013-04-10 LAB — PREGNANCY, URINE: Preg Test, Ur: NEGATIVE

## 2013-04-10 LAB — WET PREP, GENITAL
Trich, Wet Prep: NONE SEEN
Yeast Wet Prep HPF POC: NONE SEEN

## 2013-04-10 LAB — LIPASE, BLOOD: LIPASE: 26 U/L (ref 11–59)

## 2013-04-10 IMAGING — CT CT ABD-PELV W/ CM
2 of 4 series · 16 of 46 positions shown, 18 images · IV contrast (Omnipaque 300)
Comparison: None.

CLINICAL DATA: Right lower quadrant pain since this morning

EXAM:
CT ABDOMEN AND PELVIS WITH CONTRAST
TECHNIQUE: Multidetector CT imaging of the abdomen and pelvis was performed
using the standard protocol following bolus administration of
intravenous contrast.
CONTRAST:  50mL OMNIPAQUE IOHEXOL 300 MG/ML SOLN, 100mL OMNIPAQUE
IOHEXOL 300 MG/ML SOLN

[Series 2: abd_pel_with 5.0 b40f · axial · 0.73mm/px · z∈[-480,-50]mm · 13 of 96 slices shown, 15 images]
[im 5/96  soft-tissue]
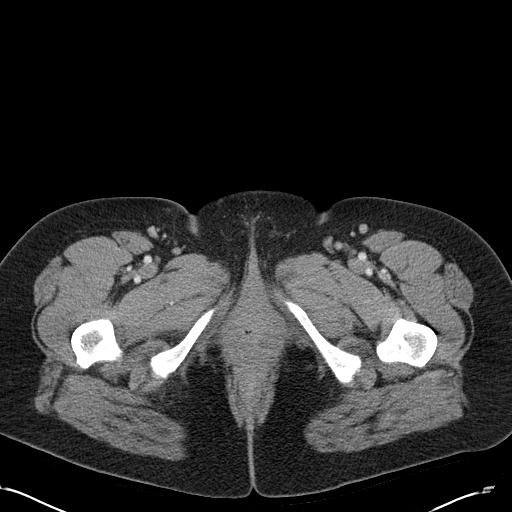
[im 5/96  bone]
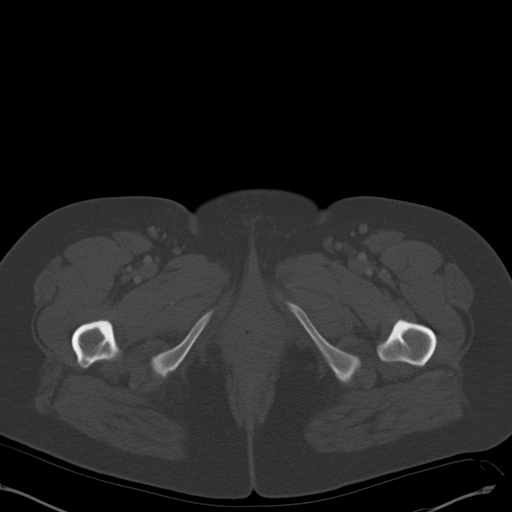
[im 13/96  soft-tissue]
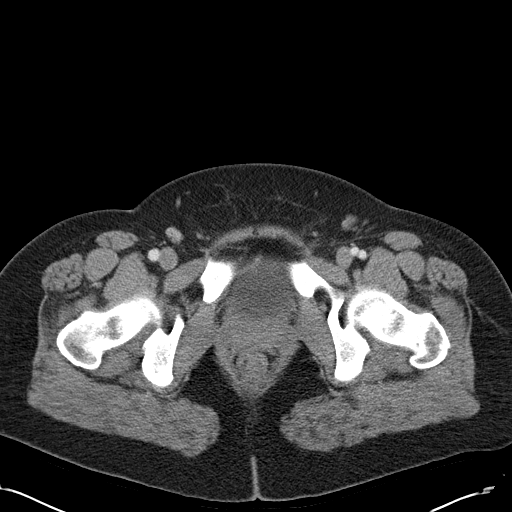
[im 22/96  soft-tissue]
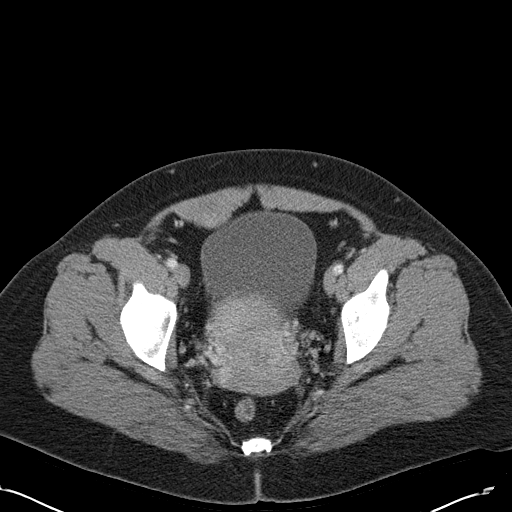
[im 26/96  soft-tissue]
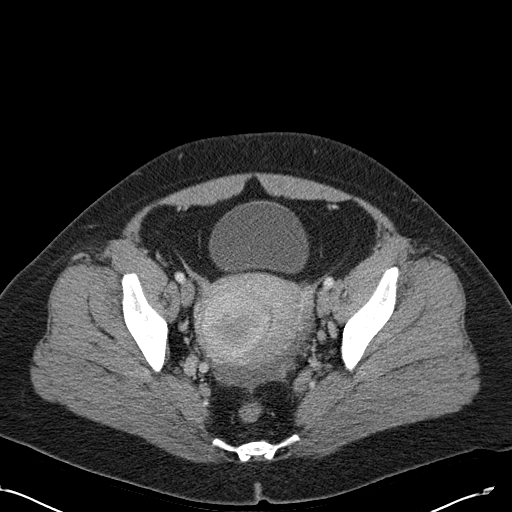
[im 35/96  soft-tissue]
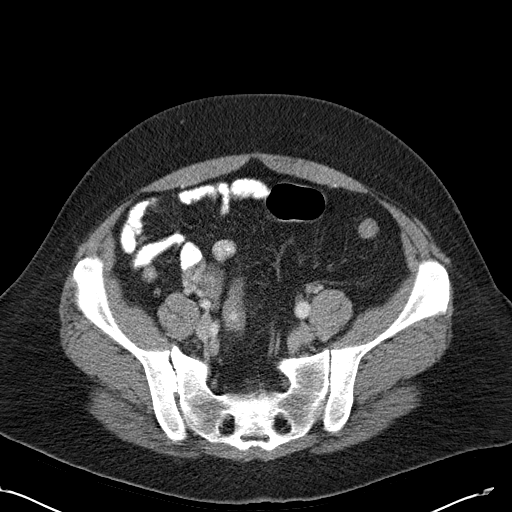
[im 39/96  soft-tissue]
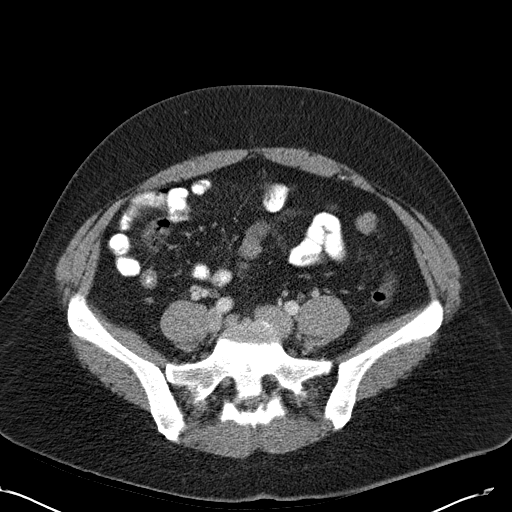
[im 48/96  soft-tissue]
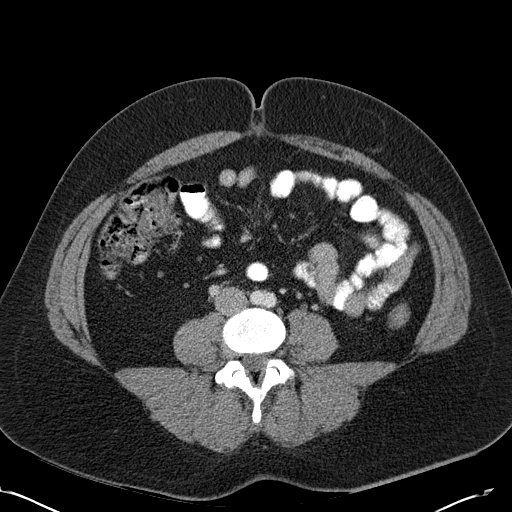
[im 57/96  soft-tissue]
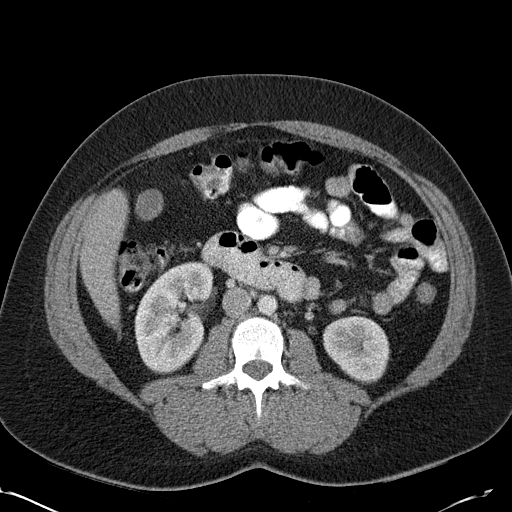
[im 61/96  soft-tissue]
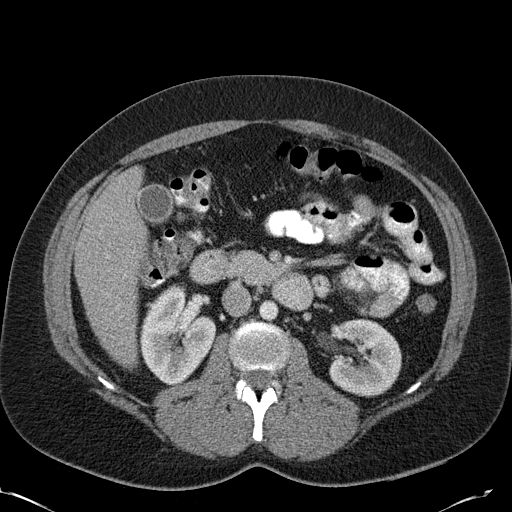
[im 61/96  bone]
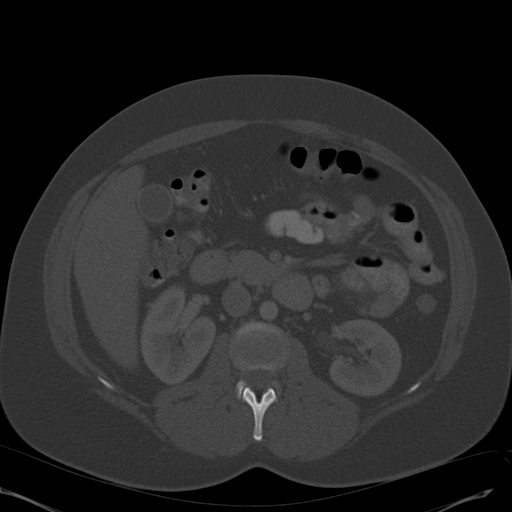
[im 70/96  soft-tissue]
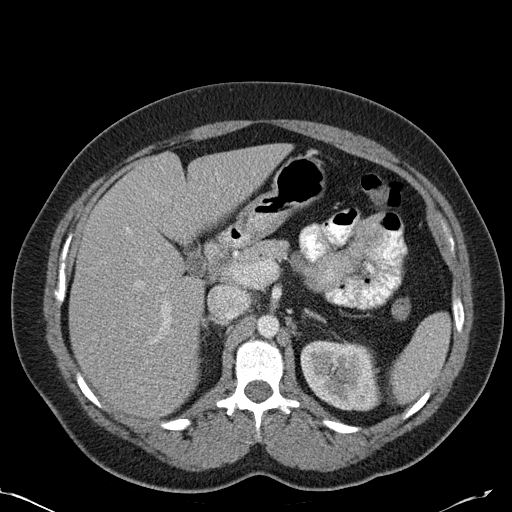
[im 74/96  soft-tissue]
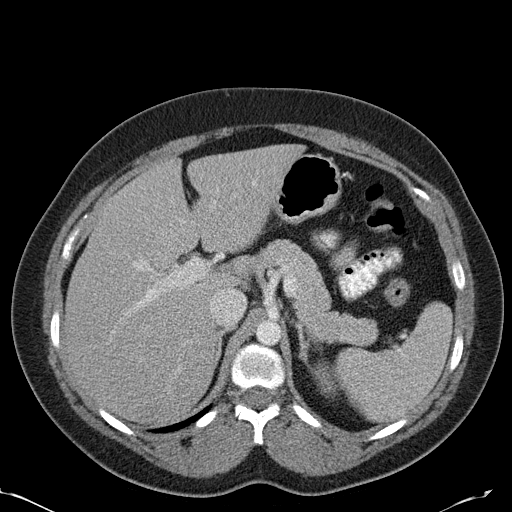
[im 83/96  soft-tissue]
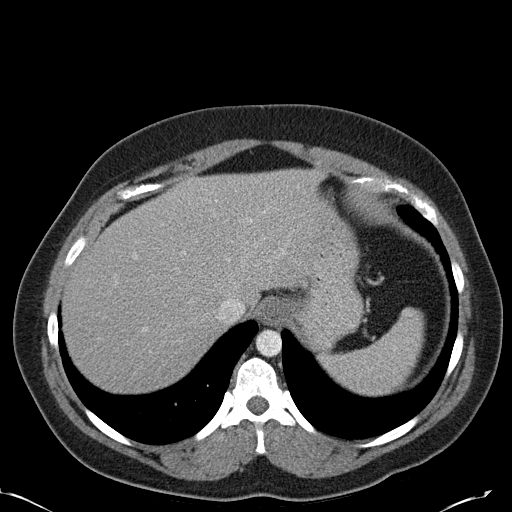
[im 91/96  soft-tissue]
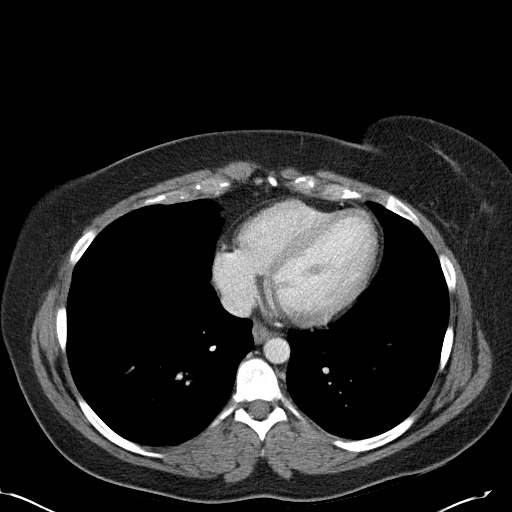

[Series 4: abd_pel_with 3.0 spo cor · coronal · 0.85mm/px · 3 of 108 slices shown]
[im 36/108  soft-tissue]
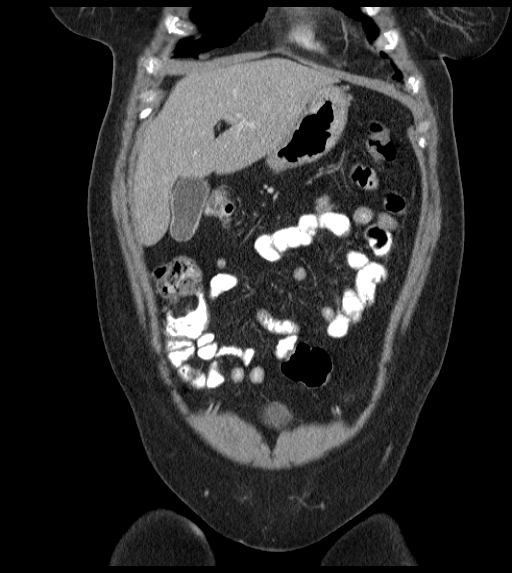
[im 48/108  soft-tissue]
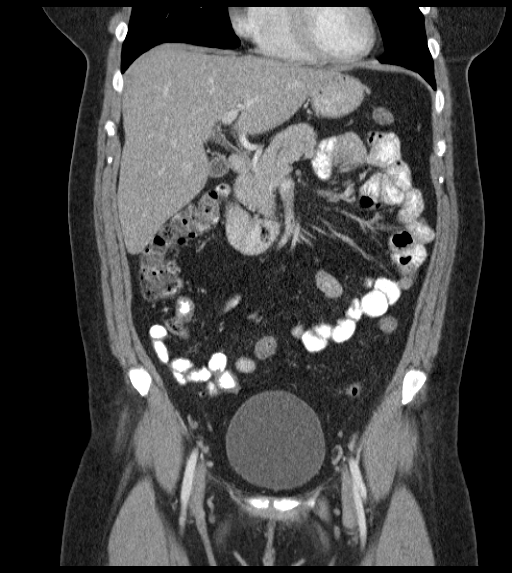
[im 60/108  soft-tissue]
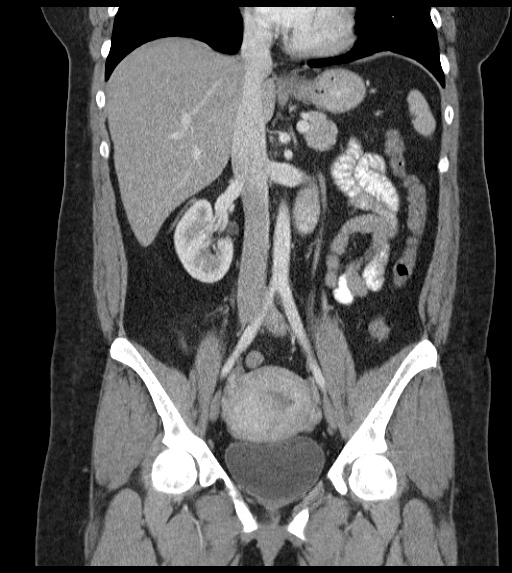

[16 of 46 positions shown; findings below may reference images not displayed]

FINDINGS: Lung bases are clear.

Gallbladder is mildly contracted which may account for concentric
hyperdensity at the fundus without radiopaque gallstone. Hepatic
hypodensity suggests steatosis. Kidneys, adrenal glands, spleen,
pancreas, and adrenal glands are unremarkable. No free air or
lymphadenopathy.

There is short segmental terminal ileal wall thickening to a maximal
thickness of approximately 5 mm, with trace surrounding stranding in
the right lower quadrant. The appendix is specifically normal, image
56. Moderate free pelvic fluid is noted. A few prominent left
inguinal nodes are identified but these are nonspecific and at upper
limits of normal for size. Bladder is unremarkable. 4.8 cm probable
right intramural/ submucosal fibroid noted. Ovaries are
unremarkable. No colonic wall thickening or focal segmental
dilatation. No evidence of bowel obstruction, surrounding fluid
collection or enteroenteric fistula.

No acute osseous abnormality.
IMPRESSION: Short segmental terminal ileal wall thickening with a configuration
most typical for Crohn's disease. Other inflammatory bowel disease,
inflammatory enteritis, or infections such as Yersinia can appear
similar.

The appendix specifically appears unremarkable.

## 2013-04-10 MED ORDER — IOHEXOL 300 MG/ML  SOLN
50.0000 mL | Freq: Once | INTRAMUSCULAR | Status: AC | PRN
  Administered 2013-04-10: 50 mL via ORAL

## 2013-04-10 MED ORDER — HYDROCODONE-ACETAMINOPHEN 5-325 MG PO TABS: 2.0000 | ORAL_TABLET | ORAL | Status: DC | PRN

## 2013-04-10 MED ORDER — SODIUM CHLORIDE 0.9 % IV BOLUS (SEPSIS)
1000.0000 mL | Freq: Once | INTRAVENOUS | Status: AC
  Administered 2013-04-10: 1000 mL via INTRAVENOUS

## 2013-04-10 MED ORDER — ONDANSETRON HCL 4 MG/2ML IJ SOLN
4.0000 mg | Freq: Once | INTRAMUSCULAR | Status: AC
  Administered 2013-04-10: 4 mg via INTRAVENOUS
  Filled 2013-04-10: qty 2

## 2013-04-10 MED ORDER — METRONIDAZOLE 500 MG PO TABS: 500.0000 mg | ORAL_TABLET | Freq: Two times a day (BID) | ORAL | Status: DC

## 2013-04-10 MED ORDER — MORPHINE SULFATE 4 MG/ML IJ SOLN
4.0000 mg | Freq: Once | INTRAMUSCULAR | Status: AC
  Administered 2013-04-10: 4 mg via INTRAVENOUS
  Filled 2013-04-10: qty 1

## 2013-04-10 MED ORDER — ONDANSETRON HCL 4 MG PO TABS: 4.0000 mg | ORAL_TABLET | Freq: Four times a day (QID) | ORAL | Status: DC

## 2013-04-10 MED ORDER — IOHEXOL 300 MG/ML  SOLN
100.0000 mL | Freq: Once | INTRAMUSCULAR | Status: AC | PRN
  Administered 2013-04-10: 100 mL via INTRAVENOUS

## 2013-04-10 NOTE — ED Provider Notes (Addendum)
CSN: [id number]     Arrival date & time 04/10/13  1303 History   This chart was scribed for non-physician practitioner working with Glynn OctaveStephen Pam Vanalstine, MD by Ashley JacobsBrittany Andrews, ED scribe. This patient was seen in room APA05/APA05 and the patient's care was started at 1:39 PM. First MD Initiated Contact with Patient 04/10/13 1328     Chief Complaint  Patient presents with  . Dysmenorrhea     (Consider location/radiation/quality/duration/timing/severity/associated sxs/prior Treatment) The history is provided by the patient and medical records. No language interpreter was used.   HPI Comments: Erica Banks is a 33 y.o. female who presents to the Emergency Department complaining of severe dysmenorrhea onset of 4-5 hours ago. Pt mentions the pain is 10/10 severity and is worse than childbirth. She is three weeks early for her menstrual cycle. She did not try anything for pain. Pt mentions "balling up into a ball" makes the pain better. Pt vomited due to pain. She has the associated symptoms of lower back pain and severe right sided abdominal pain. Denies diarrhea. Nothing seems to resolve her symptoms.  Pt had fever, cold chills, head ache and diaphoresis onset of one week ago. She had a tubal ligation two years ago.  Pt still has denies and appendectomy and cholecystectomy.   Past Medical History  Diagnosis Date  . Panic attacks    Past Surgical History  Procedure Laterality Date  . Sinus irrigation    . Tonsillectomy    . Tubal ligation    . Nasal sinus surgery     Family History  Problem Relation Age of Onset  . Diabetes Mother   . Hypertension Mother   . COPD Mother   . Diabetes Sister   . Hypertension Sister   . Cancer Other    History  Substance Use Topics  . Smoking status: Current Every Day Smoker -- 1.00 packs/day for 12 years    Types: Cigarettes  . Smokeless tobacco: Not on file  . Alcohol Use: 0.0 oz/week    4-5 Cans of beer per week     Comment: occ   OB  History   Grav Para Term Preterm Abortions TAB SAB Ect Mult Living   2 2 2       2      Review of Systems  Constitutional: Positive for fever and chills.  Gastrointestinal: Positive for nausea, vomiting and abdominal pain. Negative for diarrhea.  Genitourinary: Positive for menstrual problem and pelvic pain.  Musculoskeletal: Positive for back pain.  All other systems reviewed and are negative.      Allergies  Demerol  Home Medications   Current Outpatient Rx  Name  Route  Sig  Dispense  Refill  . albuterol (PROVENTIL HFA;VENTOLIN HFA) 108 (90 BASE) MCG/ACT inhaler   Inhalation   Inhale 1 puff into the lungs every 6 (six) hours as needed for wheezing or shortness of breath.         . ALPRAZolam (XANAX) 0.5 MG tablet   Oral   Take 0.5 mg by mouth 2 (two) times daily.         Marland Kitchen. HYDROcodone-acetaminophen (NORCO/VICODIN) 5-325 MG per tablet   Oral   Take 2 tablets by mouth every 4 (four) hours as needed.   10 tablet   0   . metroNIDAZOLE (FLAGYL) 500 MG tablet   Oral   Take 1 tablet (500 mg total) by mouth 2 (two) times daily.   14 tablet   0   . ondansetron (ZOFRAN)  4 MG tablet   Oral   Take 1 tablet (4 mg total) by mouth every 6 (six) hours.   12 tablet   0    BP 123/74  Pulse 76  Temp(Src) 97.9 F (36.6 C) (Oral)  Resp 18  Ht 5\' 6"  (1.676 m)  Wt 180 lb (81.647 kg)  BMI 29.07 kg/m2  SpO2 100%  LMP 04/10/2013 Physical Exam  Nursing note and vitals reviewed. Constitutional: She is oriented to person, place, and time. She appears well-developed and well-nourished.  uncomfortable  HENT:  Head: Normocephalic and atraumatic.  Eyes: EOM are normal. Pupils are equal, round, and reactive to light.  Neck: Normal range of motion. Neck supple. No tracheal deviation present.  Cardiovascular: Normal rate.   Pulmonary/Chest: Effort normal. No respiratory distress.  Abdominal: Soft. She exhibits no distension. There is tenderness. There is guarding. There is no  rebound.  Suprapubic, RLQ pain     Genitourinary:  Normal external genitalia. Dark blood in the vaginal vault. No CMT. No adnexal tenderness. Chaperone present  Musculoskeletal: Normal range of motion. She exhibits no tenderness.  No CVA tenderness  Neurological: She is alert and oriented to person, place, and time.  Skin: Skin is warm and dry.  Psychiatric: She has a normal mood and affect. Her behavior is normal.    ED Course  Procedures (including critical care time) DIAGNOSTIC STUDIES: Oxygen Saturation is 97% on room air, normal by my interpretation.    COORDINATION OF CARE:  1:43 PM Discussed course of care with pt . Pt understands and agrees.    Labs Review Labs Reviewed  WET PREP, GENITAL - Abnormal; Notable for the following:    Clue Cells Wet Prep HPF POC FEW (*)    WBC, Wet Prep HPF POC FEW (*)    All other components within normal limits  URINALYSIS, ROUTINE W REFLEX MICROSCOPIC - Abnormal; Notable for the following:    Specific Gravity, Urine >1.030 (*)    All other components within normal limits  CBC WITH DIFFERENTIAL - Abnormal; Notable for the following:    WBC 11.3 (*)    Neutro Abs 8.5 (*)    All other components within normal limits  COMPREHENSIVE METABOLIC PANEL - Abnormal; Notable for the following:    Glucose, Bld 120 (*)    ALT 40 (*)    All other components within normal limits  GC/CHLAMYDIA PROBE AMP  PREGNANCY, URINE  LIPASE, BLOOD   Imaging Review Ct Abdomen Pelvis W Contrast  04/10/2013   CLINICAL DATA:  Right lower quadrant pain since this morning  EXAM: CT ABDOMEN AND PELVIS WITH CONTRAST  TECHNIQUE: Multidetector CT imaging of the abdomen and pelvis was performed using the standard protocol following bolus administration of intravenous contrast.  CONTRAST:  50mL OMNIPAQUE IOHEXOL 300 MG/ML SOLN, OMNIPAQUE IOHEXOL 300 MG/ML SOLN  COMPARISON:  None.  FINDINGS: Lung bases are clear.  Gallbladder is mildly contracted which may account  for concentric hyperdensity at the fundus without radiopaque gallstone. Hepatic hypodensity suggests steatosis. Kidneys, adrenal glands, spleen, pancreas, and adrenal glands are unremarkable. No free air or lymphadenopathy.  There is short segmental terminal ileal wall thickening to a maximal thickness of approximately 5 mm, with trace surrounding stranding in the right lower quadrant. The appendix is specifically normal, image 56. Moderate free pelvic fluid is noted. A few prominent left inguinal nodes are identified but these are nonspecific and at upper limits of normal for size. Bladder is unremarkable. 4.8 cm probable right  intramural/ submucosal fibroid noted. Ovaries are unremarkable. No colonic wall thickening or focal segmental dilatation. No evidence of bowel obstruction, surrounding fluid collection or enteroenteric fistula.  No acute osseous abnormality.  IMPRESSION: Short segmental terminal ileal wall thickening with a configuration most typical for Crohn's disease. Other inflammatory bowel disease, inflammatory enteritis, or infections such as Yersinia can appear similar.  The appendix specifically appears unremarkable.   Electronically Signed   By: Christiana Pellant M.D.   On: 04/10/2013 16:05     EKG Interpretation None      MDM   Final diagnoses:  Vaginal bleeding  Bacterial vaginosis  Abdominal pain   Acute onset of right-sided lower abdominal pain with vaginal bleeding that started this morning around 10 AM. Pain is constant, sharp and stabbing. Worse than typical menstrual cramps. No nausea or vomiting.  On exam she has significant right-sided lower abdominal pain Pain resolved after medications. On reassessment patient has no pain to palpation. Pelvic exam is benign. Doubt ovarian torsion.  Pregnancy test negative. Hemoglobin stable. Vitals stable.  Appendix is normal on CT. Patient's pain has resolved. CT results discussed with Dr. Jena Gauss of gastroenterology. There is  terminal ileal wall thickening. Patient has a family history of Crohn's disease in her grandmother. She denies any blood in her stool or diarrhea. Dr. Jena Gauss does not recommended antibiotics and will see patient in the office this week. He also recommends concurrent gynecology followup for vaginal bleeding and fibroid.  Tolerating PO in the ED and pain controlled at discharge.  BP 123/74  Pulse 76  Temp(Src) 97.9 F (36.6 C) (Oral)  Resp 18  Ht 5\' 6"  (1.676 m)  Wt 180 lb (81.647 kg)  BMI 29.07 kg/m2  SpO2 100%  LMP 04/10/2013    I personally performed the services described in this documentation, which was scribed in my presence. The recorded information has been reviewed and is accurate.   Glynn Octave, MD 04/10/13 1610  Glynn Octave, MD 04/10/13 [phone]

## 2013-04-10 NOTE — ED Notes (Signed)
Fleet Contras RN in with Dr Manus Gunning to per from pelvic

## 2013-04-10 NOTE — Discharge Instructions (Signed)
Uterine Fibroid Followup with the gynecologist regarding her vaginal bleeding and fibroid. Also followup with your stomach Dr. to evaluate her abdominal pain and risk for Crohn's disease. Return to ED if you develop worsening pain, bleeding, dizziness, lightheadedness or any other concerns. A uterine fibroid is a growth (tumor) that occurs in your uterus. This type of tumor is not cancerous and does not spread out of the uterus. You can have one or many fibroids. Fibroids can vary in size, weight, and where they grow in the uterus. Some can become quite large. Most fibroids do not require medical treatment, but some can cause pain or heavy bleeding during and between periods. CAUSES  A fibroid is the result of a single uterine cell that keeps growing (unregulated), which is different than most cells in the human body. Most cells have a control mechanism that keeps them from reproducing without control.  SIGNS AND SYMPTOMS   Bleeding.  Pelvic pain and pressure.  Bladder problems due to the size of the fibroid.  Infertility and miscarriages depending on the size and location of the fibroid. DIAGNOSIS  Uterine fibroids are diagnosed through a physical exam. Your health care provider may feel the lumpy tumors during a pelvic exam. Ultrasonography may be done to get information regarding size, location, and number of tumors.  TREATMENT   Your health care provider may recommend watchful waiting. This involves getting the fibroid checked by your health care provider to see if it grows or shrinks.   Hormone treatment or an intrauterine device (IUD) may be prescribed.   Surgery may be needed to remove the fibroids (myomectomy) or the uterus (hysterectomy). This depends on your situation. When fibroids interfere with fertility and a woman wants to become pregnant, a health care provider may recommend having the fibroids removed.  HOME CARE INSTRUCTIONS  Home care depends on how you were treated. In  general:   Keep all follow-up appointments with your health care provider.   Only take over-the-counter or prescription medicines as directed by your health care provider. If you were prescribed a hormone treatment, take the hormone medicines exactly as directed. Do not take aspirin. It can cause bleeding.   Talk to your health care provider about taking iron pills.  If your periods are troublesome but not so heavy, lie down with your feet raised slightly above your heart. Place cold packs on your lower abdomen.   If your periods are heavy, write down the number of pads or tampons you use per month. Bring this information to your health care provider.   Include green vegetables in your diet.  SEEK IMMEDIATE MEDICAL CARE IF:  You have pelvic pain or cramps not controlled with medicines.   You have a sudden increase in pelvic pain.   You have an increase in bleeding between and during periods.   You have excessive periods and soak tampons or pads in a half hour or less.  You feel lightheaded or have fainting episodes. Document Released: 01/12/2000 Document Revised: 11/04/2012 Document Reviewed: 08/13/2012 [facility] Patient Information 2014 [employer], [place].

## 2013-04-10 NOTE — ED Notes (Signed)
Pt sitting up in bed, talking on cell phone, given sprite per request, tolerating po fluids at present time

## 2013-04-10 NOTE — ED Notes (Signed)
Pt states she began her menstrual period this morning, 2 weeks early, with extreme pain and cramping to pelvic area. States vomited x 3 due to pain

## 2013-04-12 LAB — GC/CHLAMYDIA PROBE AMP
CT PROBE, AMP APTIMA: NEGATIVE
GC PROBE AMP APTIMA: NEGATIVE

## 2013-04-13 ENCOUNTER — Encounter: Payer: Self-pay | Admitting: *Deleted

## 2013-05-12 ENCOUNTER — Ambulatory Visit: Payer: Medicaid Other | Admitting: Gastroenterology

## 2013-06-08 ENCOUNTER — Encounter (INDEPENDENT_AMBULATORY_CARE_PROVIDER_SITE_OTHER): Payer: Self-pay

## 2013-06-08 ENCOUNTER — Ambulatory Visit (INDEPENDENT_AMBULATORY_CARE_PROVIDER_SITE_OTHER): Payer: Medicaid Other | Admitting: Gastroenterology

## 2013-06-08 ENCOUNTER — Encounter: Payer: Self-pay | Admitting: Gastroenterology

## 2013-06-08 ENCOUNTER — Other Ambulatory Visit: Payer: Self-pay | Admitting: Internal Medicine

## 2013-06-08 VITALS — BP 142/91 | HR 75 | Temp 98.4°F | Ht 66.0 in | Wt 198.6 lb

## 2013-06-08 DIAGNOSIS — K219 Gastro-esophageal reflux disease without esophagitis: Secondary | ICD-10-CM | POA: Insufficient documentation

## 2013-06-08 DIAGNOSIS — R9389 Abnormal findings on diagnostic imaging of other specified body structures: Secondary | ICD-10-CM

## 2013-06-08 DIAGNOSIS — R935 Abnormal findings on diagnostic imaging of other abdominal regions, including retroperitoneum: Secondary | ICD-10-CM | POA: Insufficient documentation

## 2013-06-08 MED ORDER — PEG-KCL-NACL-NASULF-NA ASC-C 100 G PO SOLR: 1.0000 | ORAL | Status: DC

## 2013-06-08 MED ORDER — PANTOPRAZOLE SODIUM 40 MG PO TBEC: 40.0000 mg | DELAYED_RELEASE_TABLET | Freq: Every day | ORAL | Status: DC

## 2013-06-08 NOTE — Progress Notes (Signed)
Primary Care Physician:  Ernestine ConradBLUTH, KIRK, MD Primary Gastroenterologist:  Dr. Jena Gaussourk   Chief Complaint  Patient presents with  . Abdominal Pain    HPI:   Erica Banks presents today at the request of the ED secondary to terminal ileal wall thickening on CT in March 2015. States throughout life hasn't used bathroom regularly. Would have intermittent days of skipping bowel movements but no real constipation. States has significant bloating with eating at times. Used to be on a reflux medication (Prilsoec) from PCP, which hasn't helped. Will drink a bottle of water and notes abdominal burning located in RLQ.  Zantac works well for her now. Can't eat sausage/pork, greasy foods, spaghetti without Zantac. Sausage really does a number on her with reflux. States in March woke up with severe RLQ pain, presented to the ED, CT performed. Intermittent RLQ pain but nothing as severe as March. Sometimes stool will be very loose but not multiple episodes. Sometimes postprandial component. No rectal bleeding. No N/V. No dysphagia. Tried/failed Prilosec. Reflux daily usually but has done well with Zantac.   Past Medical History  Diagnosis Date  . Panic attacks     Past Surgical History  Procedure Laterality Date  . Sinus irrigation    . Tonsillectomy    . Tubal ligation    . Nasal sinus surgery    . Nasal bone surgery      Current Outpatient Prescriptions  Medication Sig Dispense Refill  . albuterol (PROVENTIL HFA;VENTOLIN HFA) 108 (90 BASE) MCG/ACT inhaler Inhale 1 puff into the lungs every 6 (six) hours as needed for wheezing or shortness of breath.      . ALPRAZolam (XANAX) 0.5 MG tablet Take 0.5 mg by mouth 2 (two) times daily.      . ranitidine (ZANTAC) 150 MG capsule Take 150 mg by mouth as needed for heartburn.       No current facility-administered medications for this visit.    Allergies as of 06/08/2013 - Review Complete 06/08/2013  Allergen Reaction Noted  . Demerol Anaphylaxis  10/12/2010    Family History  Problem Relation Age of Onset  . Diabetes Mother   . Hypertension Mother   . COPD Mother   . Diabetes Sister   . Hypertension Sister   . Cancer Other   . Crohn's disease Maternal Grandmother   . Colon cancer Neg Hx     History   Social History  . Marital Status: Married    Spouse Name: N/A    Number of Children: N/A  . Years of Education: N/A   Occupational History  . Unemployed     wants to go to nursing school in fall 2015   Social History Main Topics  . Smoking status: Current Every Day Smoker -- 1.00 packs/day for 12 years    Types: Cigarettes  . Smokeless tobacco: Not on file  . Alcohol Use: 0.0 oz/week    4-5 Cans of beer per week     Comment: occ  . Drug Use: 1.00 per week    Special: Marijuana  . Sexual Activity: Yes    Birth Control/ Protection: Surgical   Other Topics Concern  . Not on file   Social History Narrative  . No narrative on file    Review of Systems: As mentioned in HPI  Physical Exam: BP 142/91  Pulse 75  Temp(Src) 98.4 F (36.9 C) (Oral)  Ht 5\' 6"  (1.676 m)  Wt 198 lb 9.6 oz (90.084 kg)  BMI 32.07 kg/m2  LMP 05/17/2013 General:   Alert and oriented. Pleasant and cooperative. Well-nourished and well-developed.  Head:  Normocephalic and atraumatic. Eyes:  Without icterus, sclera clear and conjunctiva pink.  Ears:  Normal auditory acuity. Nose:  No deformity, discharge,  or lesions. Mouth:  No deformity or lesions, oral mucosa pink.  Lungs:  Clear to auscultation bilaterally. No wheezes, rales, or rhonchi. No distress.  Heart:  S1, S2 present without murmurs appreciated.  Abdomen:  +BS, soft, non-tender and non-distended. No HSM noted. No guarding or rebound. No masses appreciated.  Msk:  Symmetrical without gross deformities. Normal posture. Extremities:  Without clubbing or edema. Neurologic:  Alert and  oriented x4;  grossly normal neurologically. Skin:  Intact without significant lesions or  rashes. Psych:  Alert and cooperative. Normal mood and affect.   CT WITH CONTRAST MARCH 2015 IMPRESSION:  Short segmental terminal ileal wall thickening with a configuration  most typical for Crohn's disease. Other inflammatory bowel disease,  inflammatory enteritis, or infections such as Yersinia can appear  similar.

## 2013-06-08 NOTE — Assessment & Plan Note (Signed)
33 year old female with intermittent RLQ discomfort and CT in March 2015 revealing short segment terminal ileal wall thickening with question of Crohn's disease, no significant diarrhea or rectal bleeding. Overall improved after ED visit in March 2015. Intermittent bloating with certain types of food (greasy, spicy). Needs lower GI evaluation via colonoscopy with terminal ileum biopsies to assess for possible IBD.   Proceed with TCS with TI biopsies with Dr. Jena Gauss in near future: the risks, benefits, and alternatives have been discussed with the patient in detail. The patient states understanding and desires to proceed. ALLERGIC TO DEMEROL  Phenergan 25 mg IV on call due to hx of marijuana

## 2013-06-08 NOTE — Patient Instructions (Signed)
I have sent Protonix to your pharmacy to take once each morning, 30 minutes before breakfast.   We have scheduled you for a colonoscopy with Dr. Jena Gauss in the near future. Further recommendations to follow.

## 2013-06-08 NOTE — Progress Notes (Signed)
Cc'd to pcp 

## 2013-06-08 NOTE — Assessment & Plan Note (Signed)
Without alarm features. Zantac daily. Will trial PPI. Tried/failed Prilosec. Start Protonix once daily.

## 2013-06-09 ENCOUNTER — Encounter (HOSPITAL_COMMUNITY): Payer: Self-pay | Admitting: Pharmacy Technician

## 2013-06-24 ENCOUNTER — Encounter (HOSPITAL_COMMUNITY)
Admission: RE | Disposition: A | Discharge status: Home / Self Care | Payer: Self-pay | Source: Ambulatory Visit | Attending: Internal Medicine

## 2013-06-24 ENCOUNTER — Ambulatory Visit (HOSPITAL_COMMUNITY)
Admission: RE | Admit: 2013-06-24 | Discharge: 2013-06-24 | Disposition: A | Discharge status: Home / Self Care | Payer: Medicaid Other | Source: Ambulatory Visit | Attending: Internal Medicine | Admitting: Internal Medicine

## 2013-06-24 ENCOUNTER — Encounter (HOSPITAL_COMMUNITY): Payer: Self-pay

## 2013-06-24 DIAGNOSIS — K644 Residual hemorrhoidal skin tags: Secondary | ICD-10-CM | POA: Insufficient documentation

## 2013-06-24 DIAGNOSIS — Z885 Allergy status to narcotic agent status: Secondary | ICD-10-CM | POA: Insufficient documentation

## 2013-06-24 DIAGNOSIS — Z79899 Other long term (current) drug therapy: Secondary | ICD-10-CM | POA: Insufficient documentation

## 2013-06-24 DIAGNOSIS — R9389 Abnormal findings on diagnostic imaging of other specified body structures: Secondary | ICD-10-CM

## 2013-06-24 DIAGNOSIS — Z800 Family history of malignant neoplasm of digestive organs: Secondary | ICD-10-CM | POA: Insufficient documentation

## 2013-06-24 DIAGNOSIS — F17200 Nicotine dependence, unspecified, uncomplicated: Secondary | ICD-10-CM | POA: Insufficient documentation

## 2013-06-24 DIAGNOSIS — R1031 Right lower quadrant pain: Secondary | ICD-10-CM | POA: Insufficient documentation

## 2013-06-24 DIAGNOSIS — R933 Abnormal findings on diagnostic imaging of other parts of digestive tract: Secondary | ICD-10-CM

## 2013-06-24 DIAGNOSIS — K219 Gastro-esophageal reflux disease without esophagitis: Secondary | ICD-10-CM | POA: Insufficient documentation

## 2013-06-24 HISTORY — PX: BIOPSY: SHX5522

## 2013-06-24 HISTORY — PX: COLONOSCOPY: SHX5424

## 2013-06-24 SURGERY — COLONOSCOPY
Anesthesia: Moderate Sedation

## 2013-06-24 MED ORDER — PROMETHAZINE HCL 25 MG/ML IJ SOLN
INTRAMUSCULAR | Status: AC
  Filled 2013-06-24: qty 1

## 2013-06-24 MED ORDER — MIDAZOLAM HCL 5 MG/5ML IJ SOLN
INTRAMUSCULAR | Status: DC | PRN
  Administered 2013-06-24 (×2): 2 mg via INTRAVENOUS
  Administered 2013-06-24 (×4): 1 mg via INTRAVENOUS
  Administered 2013-06-24: 2 mg via INTRAVENOUS
  Administered 2013-06-24: 1 mg via INTRAVENOUS
  Administered 2013-06-24 (×2): 2 mg via INTRAVENOUS

## 2013-06-24 MED ORDER — PROMETHAZINE HCL 25 MG/ML IJ SOLN
25.0000 mg | Freq: Once | INTRAMUSCULAR | Status: AC
  Administered 2013-06-24: 25 mg via INTRAVENOUS

## 2013-06-24 MED ORDER — FENTANYL CITRATE 0.05 MG/ML IJ SOLN
INTRAMUSCULAR | Status: AC
  Filled 2013-06-24: qty 4

## 2013-06-24 MED ORDER — SODIUM CHLORIDE 0.9 % IJ SOLN
INTRAMUSCULAR | Status: AC
  Filled 2013-06-24: qty 10

## 2013-06-24 MED ORDER — MIDAZOLAM HCL 5 MG/5ML IJ SOLN
INTRAMUSCULAR | Status: AC
  Filled 2013-06-24: qty 10

## 2013-06-24 MED ORDER — ONDANSETRON HCL 4 MG/2ML IJ SOLN
INTRAMUSCULAR | Status: DC | PRN
  Administered 2013-06-24: 4 mg via INTRAVENOUS

## 2013-06-24 MED ORDER — MIDAZOLAM HCL 5 MG/5ML IJ SOLN
INTRAMUSCULAR | Status: AC
  Filled 2013-06-24: qty 5

## 2013-06-24 MED ORDER — ONDANSETRON HCL 4 MG/2ML IJ SOLN
INTRAMUSCULAR | Status: AC
  Filled 2013-06-24: qty 2

## 2013-06-24 MED ORDER — STERILE WATER FOR IRRIGATION IR SOLN
Status: DC | PRN
  Administered 2013-06-24: 13:00:00

## 2013-06-24 MED ORDER — SODIUM CHLORIDE 0.9 % IV SOLN
INTRAVENOUS | Status: DC
  Administered 2013-06-24: 12:00:00 via INTRAVENOUS

## 2013-06-24 NOTE — Op Note (Signed)
[facility] [address] [place], [address]   COLONOSCOPY PROCEDURE REPORT  PATIENT: Erica Banks, Erica Banks  MR#:         [id number] BIRTHDATE: 10-Dec-1980 , 32  yrs. old GENDER: Female ENDOSCOPIST: R.  Roetta Sessions, MD FACP [facility] REFERRED BY:  Ernestine Conrad, M.D. PROCEDURE DATE:  06/24/2013 PROCEDURE:     Ileocolonoscopy with terminal ileal biopsy  INDICATIONS: Recent right lower quadrant abdominal pain and abnormal TI on CT scan  INFORMED CONSENT:  The risks, benefits, alternatives and imponderables including but not limited to bleeding, perforation as well as the possibility of a missed lesion have been reviewed.  The potential for biopsy, lesion removal, etc. have also been discussed.  Questions have been answered.  All parties agreeable. Please see the history and physical in the medical record for more information.  MEDICATIONS: Versed 15 mg IV in divided doses. Phenergan 12.5 mg IV  DESCRIPTION OF PROCEDURE:  After a digital rectal exam was performed, the EC-[id number] ([id number])  colonoscope was advanced from the anus through the rectum and colon to the area of the cecum, ileocecal valve and appendiceal orifice.  The cecum was deeply intubated.  These structures were well-seen and photographed for the record.  From the level of the cecum and ileocecal valve, the scope was slowly and cautiously withdrawn.  The mucosal surfaces were carefully surveyed utilizing scope tip deflection to facilitate fold flattening as needed.  The scope was pulled down into the rectum where a thorough examination including retroflexion was performed.    FINDINGS:  Adequate preparation. External hemorrhoids; rectum appeared normal. Normal colonic mucosa. The distal 5 cm of terminal ileal mucosa appeared normal. It was difficult to intubate the terminal ileum because of positioning. I eventually was able to intubate the ileum after changing the patient's position to right lateral  decubitus position.   The terminal ileum visualized appeared entirely normal.  THERAPEUTIC / DIAGNOSTIC MANEUVERS PERFORMED:  I elected to biopsy the terminal ileum for histologic study  COMPLICATIONS: none  CECAL WITHDRAWAL TIME:  16 minutes  IMPRESSION:  External hemorrhoids. Normal ileocolonoscopy-status post biopsy of the terminal ileum  RECOMMENDATIONS: Followup on pathology   _______________________________ eSigned:  R. Roetta Sessions, MD FACP [facility] 06/24/2013 2:27 PM   CC:    PATIENT NAME:  Lundon, Brannum MR#: [id number]

## 2013-06-24 NOTE — H&P (View-Only) (Signed)
Cc'd to pcp 

## 2013-06-24 NOTE — H&P (View-Only) (Signed)
    Primary Care Physician:  BLUTH, KIRK, MD Primary Gastroenterologist:  Dr. Rourk   Chief Complaint  Patient presents with  . Abdominal Pain    HPI:   Erica Banks presents today at the request of the ED secondary to terminal ileal wall thickening on CT in March 2015. States throughout life hasn't used bathroom regularly. Would have intermittent days of skipping bowel movements but no real constipation. States has significant bloating with eating at times. Used to be on a reflux medication (Prilsoec) from PCP, which hasn't helped. Will drink a bottle of water and notes abdominal burning located in RLQ.  Zantac works well for her now. Can't eat sausage/pork, greasy foods, spaghetti without Zantac. Sausage really does a number on her with reflux. States in March woke up with severe RLQ pain, presented to the ED, CT performed. Intermittent RLQ pain but nothing as severe as March. Sometimes stool will be very loose but not multiple episodes. Sometimes postprandial component. No rectal bleeding. No N/V. No dysphagia. Tried/failed Prilosec. Reflux daily usually but has done well with Zantac.   Past Medical History  Diagnosis Date  . Panic attacks     Past Surgical History  Procedure Laterality Date  . Sinus irrigation    . Tonsillectomy    . Tubal ligation    . Nasal sinus surgery    . Nasal bone surgery      Current Outpatient Prescriptions  Medication Sig Dispense Refill  . albuterol (PROVENTIL HFA;VENTOLIN HFA) 108 (90 BASE) MCG/ACT inhaler Inhale 1 puff into the lungs every 6 (six) hours as needed for wheezing or shortness of breath.      . ALPRAZolam (XANAX) 0.5 MG tablet Take 0.5 mg by mouth 2 (two) times daily.      . ranitidine (ZANTAC) 150 MG capsule Take 150 mg by mouth as needed for heartburn.       No current facility-administered medications for this visit.    Allergies as of 06/08/2013 - Review Complete 06/08/2013  Allergen Reaction Noted  . Demerol Anaphylaxis  10/12/2010    Family History  Problem Relation Age of Onset  . Diabetes Mother   . Hypertension Mother   . COPD Mother   . Diabetes Sister   . Hypertension Sister   . Cancer Other   . Crohn's disease Maternal Grandmother   . Colon cancer Neg Hx     History   Social History  . Marital Status: Married    Spouse Name: N/A    Number of Children: N/A  . Years of Education: N/A   Occupational History  . Unemployed     wants to go to nursing school in fall 2015   Social History Main Topics  . Smoking status: Current Every Day Smoker -- 1.00 packs/day for 12 years    Types: Cigarettes  . Smokeless tobacco: Not on file  . Alcohol Use: 0.0 oz/week    4-5 Cans of beer per week     Comment: occ  . Drug Use: 1.00 per week    Special: Marijuana  . Sexual Activity: Yes    Birth Control/ Protection: Surgical   Other Topics Concern  . Not on file   Social History Narrative  . No narrative on file    Review of Systems: As mentioned in HPI  Physical Exam: BP 142/91  Pulse 75  Temp(Src) 98.4 F (36.9 C) (Oral)  Ht 5' 6" (1.676 m)  Wt 198 lb 9.6 oz (90.084 kg)    BMI 32.07 kg/m2  LMP 05/17/2013 General:   Alert and oriented. Pleasant and cooperative. Well-nourished and well-developed.  Head:  Normocephalic and atraumatic. Eyes:  Without icterus, sclera clear and conjunctiva pink.  Ears:  Normal auditory acuity. Nose:  No deformity, discharge,  or lesions. Mouth:  No deformity or lesions, oral mucosa pink.  Lungs:  Clear to auscultation bilaterally. No wheezes, rales, or rhonchi. No distress.  Heart:  S1, S2 present without murmurs appreciated.  Abdomen:  +BS, soft, non-tender and non-distended. No HSM noted. No guarding or rebound. No masses appreciated.  Msk:  Symmetrical without gross deformities. Normal posture. Extremities:  Without clubbing or edema. Neurologic:  Alert and  oriented x4;  grossly normal neurologically. Skin:  Intact without significant lesions or  rashes. Psych:  Alert and cooperative. Normal mood and affect.   CT WITH CONTRAST MARCH 2015 IMPRESSION:  Short segmental terminal ileal wall thickening with a configuration  most typical for Crohn's disease. Other inflammatory bowel disease,  inflammatory enteritis, or infections such as Yersinia can appear  similar.

## 2013-06-24 NOTE — Interval H&P Note (Signed)
History and Physical Interval Note:  06/24/2013 1:27 PM  Erica Banks  has presented today for surgery, with the diagnosis of ABNORMAL CT  The various methods of treatment have been discussed with the patient and family. After consideration of risks, benefits and other options for treatment, the patient has consented to  Procedure(s) with comments: COLONOSCOPY (N/A) - 2:00-moved to 1200 Leigh Ann to notify pt BIOPSY TERMINAL ILEUM (N/A) as a surgical intervention .  The patient's history has been reviewed, patient examined, no change in status, stable for surgery.  I have reviewed the patient's chart and labs.  Questions were answered to the patient's satisfaction.     Gerrit Friends [name]

## 2013-06-24 NOTE — Interval H&P Note (Signed)
History and Physical Interval Note:  06/24/2013 1:27 PM  Erica Banks  has presented today for surgery, with the diagnosis of ABNORMAL CT  The various methods of treatment have been discussed with the patient and family. After consideration of risks, benefits and other options for treatment, the patient has consented to  Procedure(s) with comments: COLONOSCOPY (N/A) - 2:00-moved to 1200 Leigh Ann to notify pt BIOPSY TERMINAL ILEUM (N/A) as a surgical intervention .  The patient's history has been reviewed, patient examined, no change in status, stable for surgery.  I have reviewed the patient's chart and labs.  Questions were answered to the patient's satisfaction.      No change. Colonoscopy per plan.The risks, benefits, limitations, alternatives and imponderables have been reviewed with the patient. Questions have been answered. All parties are agreeable.    Gerrit Friends Fredrico Beedle

## 2013-06-24 NOTE — Discharge Instructions (Signed)
°  Colonoscopy Discharge Instructions  Read the instructions outlined below and refer to this sheet in the next few weeks. These discharge instructions provide you with general information on caring for yourself after you leave the hospital. Your doctor may also give you specific instructions. While your treatment has been planned according to the most current medical practices available, unavoidable complications occasionally occur. If you have any problems or questions after discharge, call Dr. Jena Gauss at [phone]. ACTIVITY  You may resume your regular activity, but move at a slower pace for the next 24 hours.   Take frequent rest periods for the next 24 hours.   Walking will help get rid of the air and reduce the bloated feeling in your belly (abdomen).   No driving for 24 hours (because of the medicine (anesthesia) used during the test).    Do not sign any important legal documents or operate any machinery for 24 hours (because of the anesthesia used during the test).  NUTRITION  Drink plenty of fluids.   You may resume your normal diet as instructed by your doctor.   Begin with a light meal and progress to your normal diet. Heavy or fried foods are harder to digest and may make you feel sick to your stomach (nauseated).   Avoid alcoholic beverages for 24 hours or as instructed.  MEDICATIONS  You may resume your normal medications unless your doctor tells you otherwise.  WHAT YOU CAN EXPECT TODAY  Some feelings of bloating in the abdomen.   Passage of more gas than usual.   Spotting of blood in your stool or on the toilet paper.  IF YOU HAD POLYPS REMOVED DURING THE COLONOSCOPY:  No aspirin products for 7 days or as instructed.   No alcohol for 7 days or as instructed.   Eat a soft diet for the next 24 hours.  FINDING OUT THE RESULTS OF YOUR TEST Not all test results are available during your visit. If your test results are not back during the visit, make an appointment  with your caregiver to find out the results. Do not assume everything is normal if you have not heard from your caregiver or the medical facility. It is important for you to follow up on all of your test results.  SEEK IMMEDIATE MEDICAL ATTENTION IF:  You have more than a spotting of blood in your stool.   Your belly is swollen (abdominal distention).   You are nauseated or vomiting.   You have a temperature over 101.   You have abdominal pain or discomfort that is severe or gets worse throughout the day.   Your colonoscopy and the end of your small intestine appeared normal.  Further recommendations to follow once I have the pathology report back for review.

## 2013-06-25 ENCOUNTER — Encounter (HOSPITAL_COMMUNITY): Payer: Self-pay | Admitting: Internal Medicine

## 2013-06-30 ENCOUNTER — Encounter: Payer: Self-pay | Admitting: Internal Medicine

## 2013-08-17 ENCOUNTER — Ambulatory Visit: Payer: Medicaid Other | Admitting: Gastroenterology

## 2013-09-24 ENCOUNTER — Ambulatory Visit: Payer: Medicaid Other | Admitting: Gastroenterology

## 2013-09-24 ENCOUNTER — Telehealth: Payer: Self-pay | Admitting: Gastroenterology

## 2013-09-24 NOTE — Telephone Encounter (Signed)
Pt was a no show

## 2015-01-01 ENCOUNTER — Inpatient Hospital Stay (HOSPITAL_COMMUNITY)
Admission: EM | Admit: 2015-01-01 | Discharge: 2015-01-02 | DRG: 812 | Disposition: A | Discharge status: Home / Self Care | Payer: Medicaid Other | Attending: Internal Medicine | Admitting: Internal Medicine

## 2015-01-01 ENCOUNTER — Encounter (HOSPITAL_COMMUNITY): Payer: Self-pay | Admitting: Emergency Medicine

## 2015-01-01 ENCOUNTER — Emergency Department (HOSPITAL_COMMUNITY): Payer: Medicaid Other

## 2015-01-01 DIAGNOSIS — A084 Viral intestinal infection, unspecified: Secondary | ICD-10-CM | POA: Diagnosis present

## 2015-01-01 DIAGNOSIS — Z825 Family history of asthma and other chronic lower respiratory diseases: Secondary | ICD-10-CM | POA: Diagnosis not present

## 2015-01-01 DIAGNOSIS — F17210 Nicotine dependence, cigarettes, uncomplicated: Secondary | ICD-10-CM | POA: Diagnosis present

## 2015-01-01 DIAGNOSIS — E876 Hypokalemia: Secondary | ICD-10-CM | POA: Diagnosis present

## 2015-01-01 DIAGNOSIS — K529 Noninfective gastroenteritis and colitis, unspecified: Secondary | ICD-10-CM

## 2015-01-01 DIAGNOSIS — K219 Gastro-esophageal reflux disease without esophagitis: Secondary | ICD-10-CM | POA: Diagnosis present

## 2015-01-01 DIAGNOSIS — D638 Anemia in other chronic diseases classified elsewhere: Secondary | ICD-10-CM | POA: Diagnosis present

## 2015-01-01 DIAGNOSIS — Z833 Family history of diabetes mellitus: Secondary | ICD-10-CM

## 2015-01-01 DIAGNOSIS — D500 Iron deficiency anemia secondary to blood loss (chronic): Principal | ICD-10-CM | POA: Diagnosis present

## 2015-01-01 DIAGNOSIS — R509 Fever, unspecified: Secondary | ICD-10-CM | POA: Diagnosis present

## 2015-01-01 DIAGNOSIS — D649 Anemia, unspecified: Secondary | ICD-10-CM | POA: Insufficient documentation

## 2015-01-01 DIAGNOSIS — Z8249 Family history of ischemic heart disease and other diseases of the circulatory system: Secondary | ICD-10-CM

## 2015-01-01 DIAGNOSIS — F410 Panic disorder [episodic paroxysmal anxiety] without agoraphobia: Secondary | ICD-10-CM | POA: Diagnosis present

## 2015-01-01 DIAGNOSIS — N920 Excessive and frequent menstruation with regular cycle: Secondary | ICD-10-CM | POA: Diagnosis present

## 2015-01-01 LAB — COMPREHENSIVE METABOLIC PANEL
ALBUMIN: 3.8 g/dL (ref 3.5–5.0)
ALK PHOS: 52 U/L (ref 38–126)
ALT: 21 U/L (ref 14–54)
AST: 22 U/L (ref 15–41)
Anion gap: 9 (ref 5–15)
BILIRUBIN TOTAL: 0.6 mg/dL (ref 0.3–1.2)
BUN: 5 mg/dL — AB (ref 6–20)
CO2: 24 mmol/L (ref 22–32)
Calcium: 9 mg/dL (ref 8.9–10.3)
Chloride: 101 mmol/L (ref 101–111)
Creatinine, Ser: 0.86 mg/dL (ref 0.44–1.00)
GFR calc Af Amer: >60 mL/min (ref >60)
GFR calc non Af Amer: >60 mL/min (ref >60)
GLUCOSE: 101 mg/dL — AB (ref 65–99)
Potassium: 2.8 mmol/L — ABNORMAL LOW (ref 3.5–5.1)
SODIUM: 134 mmol/L — AB (ref 135–145)
TOTAL PROTEIN: 7.6 g/dL (ref 6.5–8.1)

## 2015-01-01 LAB — RETICULOCYTES
RBC.: 3.75 MIL/uL — AB (ref 3.87–5.11)
Retic Count, Absolute: 78.8 10*3/uL (ref 19.0–186.0)
Retic Ct Pct: 2.1 % (ref 0.4–3.1)

## 2015-01-01 LAB — CBC WITH DIFFERENTIAL/PLATELET
BASOS PCT: 1 %
Basophils Absolute: 0.1 10*3/uL (ref 0.0–0.1)
EOS ABS: 0 10*3/uL (ref 0.0–0.7)
EOS PCT: 0 %
HCT: 27.3 % — ABNORMAL LOW (ref 36.0–46.0)
Hemoglobin: 7.6 g/dL — ABNORMAL LOW (ref 12.0–15.0)
Lymphocytes Relative: 17 %
Lymphs Abs: 1.9 10*3/uL (ref 0.7–4.0)
MCH: 19 pg — AB (ref 26.0–34.0)
MCHC: 27.8 g/dL — AB (ref 30.0–36.0)
MCV: 68.1 fL — ABNORMAL LOW (ref 78.0–100.0)
MONOS PCT: 7 %
Monocytes Absolute: 0.7 10*3/uL (ref 0.1–1.0)
NEUTROS ABS: 8.1 10*3/uL — AB (ref 1.7–7.7)
NEUTROS PCT: 75 %
PLATELETS: 379 10*3/uL (ref 150–400)
RBC: 4.01 MIL/uL (ref 3.87–5.11)
RDW: 16.6 % — AB (ref 11.5–15.5)
WBC: 10.7 10*3/uL — AB (ref 4.0–10.5)

## 2015-01-01 LAB — MAGNESIUM: MAGNESIUM: 1.6 mg/dL — AB (ref 1.7–2.4)

## 2015-01-01 LAB — PREPARE RBC (CROSSMATCH)

## 2015-01-01 LAB — ABO/RH: ABO/RH(D): O POS

## 2015-01-01 IMAGING — DX DG CHEST 2V
2 series · 2 of 2 positions shown · non-contrast
Comparison: Chest radiograph performed [DATE]

CLINICAL DATA: Acute onset of left upper back pain. Vomiting and
fever. Initial encounter.

EXAM:
CHEST  2 VIEW

[chest pa]
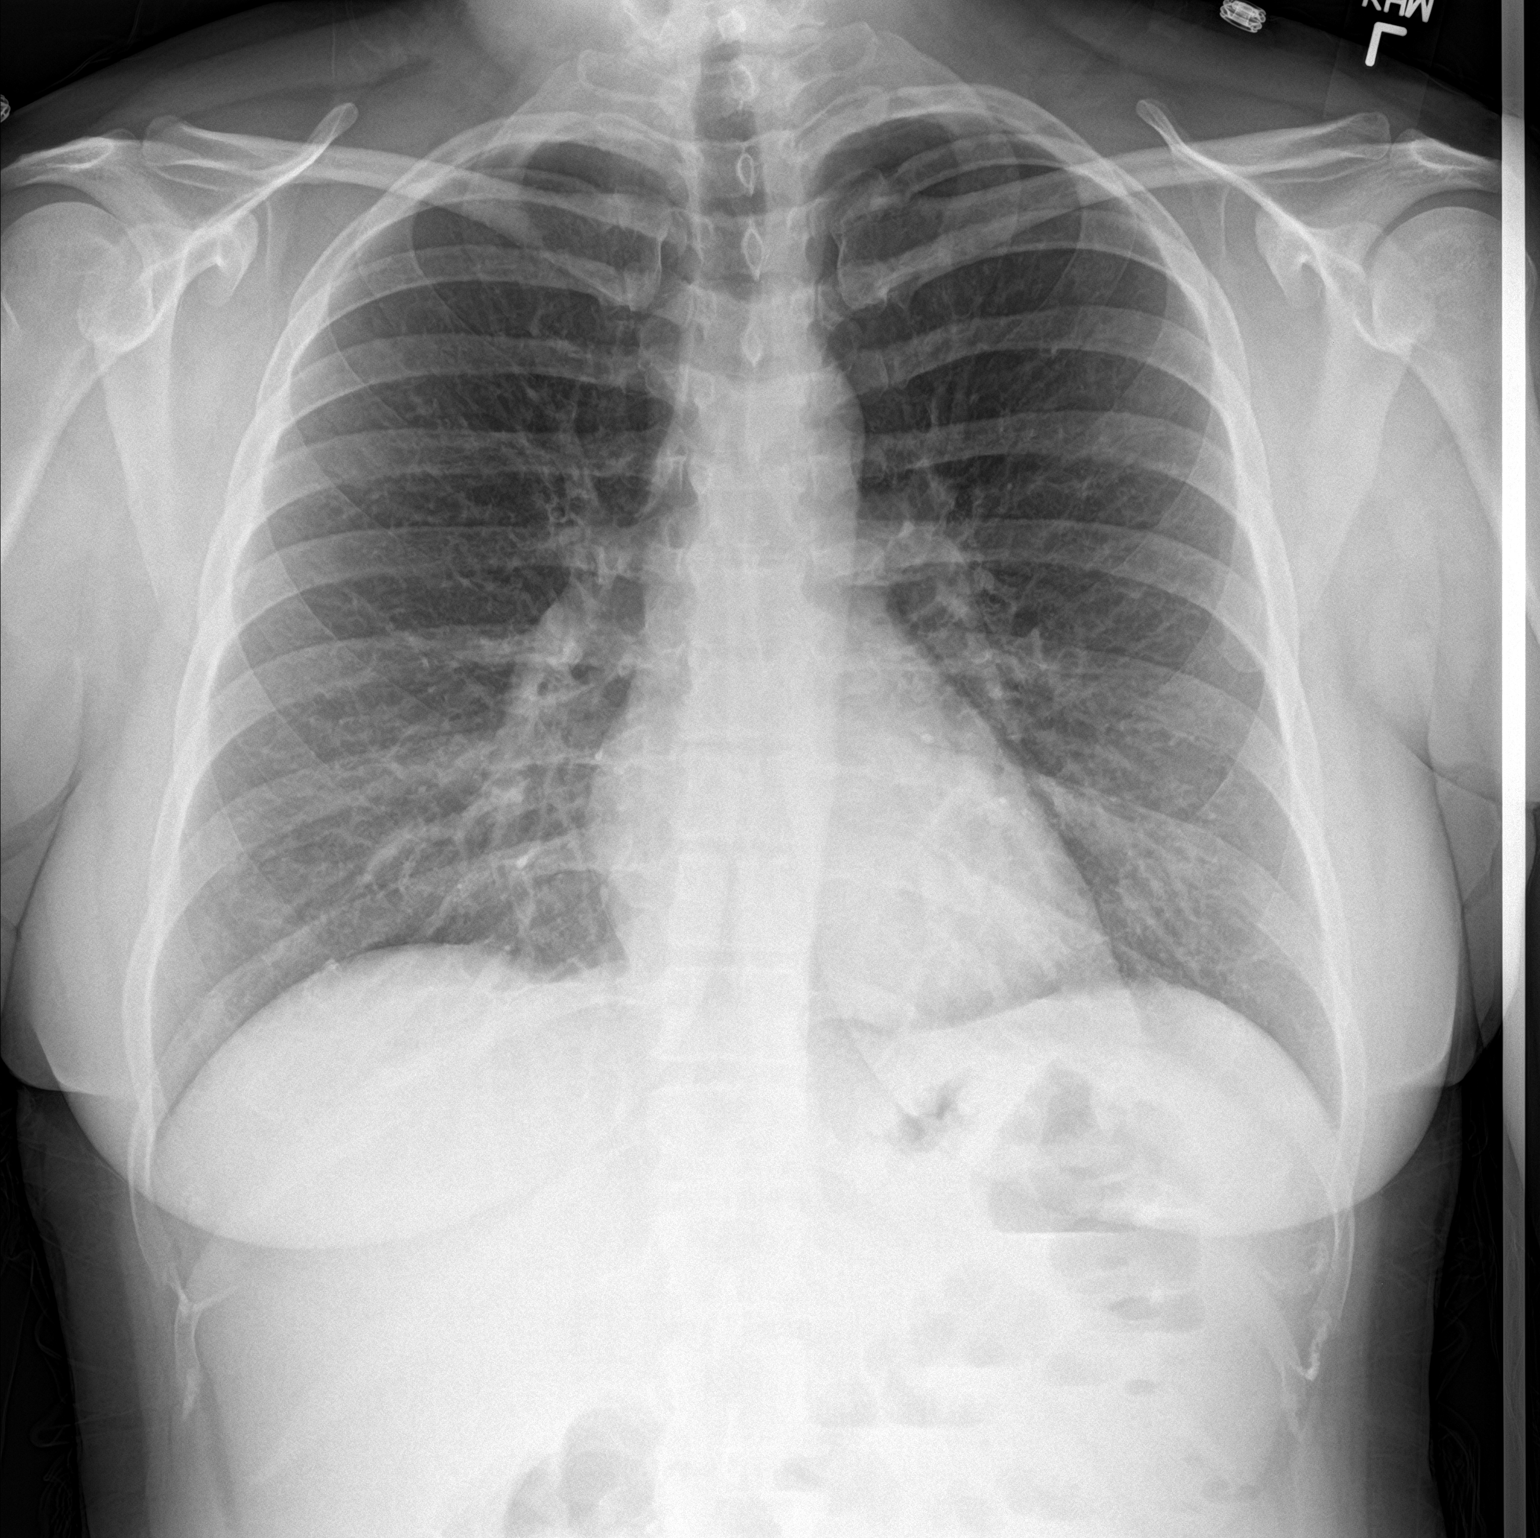

[chest lat]
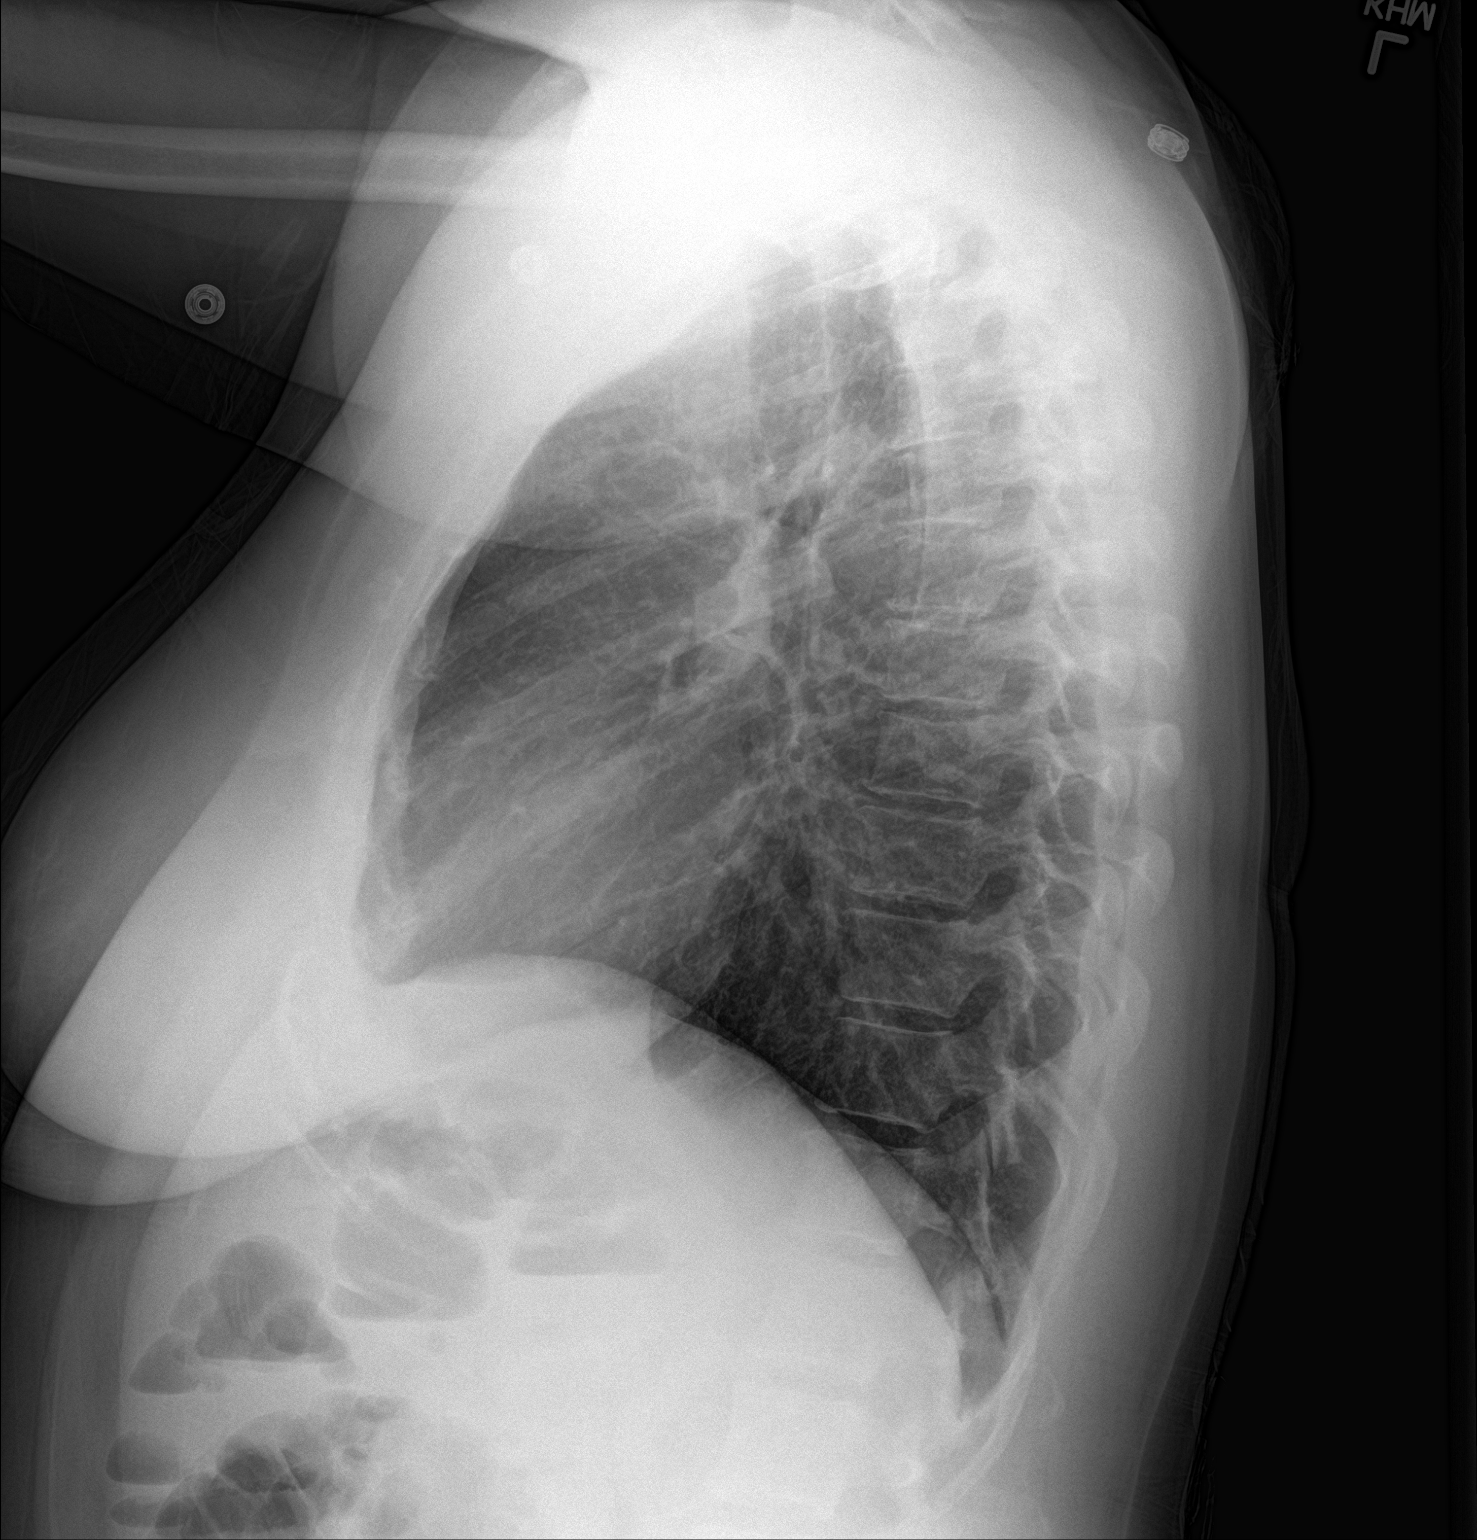

[2 of 2 positions shown; findings below may reference images not displayed]

FINDINGS: The lungs are well-aerated and clear. There is no evidence of focal
opacification, pleural effusion or pneumothorax.

The heart is normal in size; the mediastinal contour is within
normal limits. No acute osseous abnormalities are seen.
IMPRESSION: No acute cardiopulmonary process seen. No displaced rib fractures
identified.

## 2015-01-01 MED ORDER — ALUM & MAG HYDROXIDE-SIMETH 200-200-20 MG/5ML PO SUSP: 30.0000 mL | Freq: Four times a day (QID) | ORAL | Status: DC | PRN

## 2015-01-01 MED ORDER — SODIUM CHLORIDE 0.9 % IV SOLN
INTRAVENOUS | Status: DC
  Administered 2015-01-02: via INTRAVENOUS

## 2015-01-01 MED ORDER — SODIUM CHLORIDE 0.9 % IV SOLN
Freq: Once | INTRAVENOUS | Status: AC
Start: 2015-01-01 — End: 2015-01-02
  Administered 2015-01-02: 02:00:00 via INTRAVENOUS

## 2015-01-01 MED ORDER — OXYCODONE HCL 5 MG PO TABS: 5.0000 mg | ORAL_TABLET | ORAL | Status: DC | PRN

## 2015-01-01 MED ORDER — SODIUM CHLORIDE 0.9 % IV BOLUS (SEPSIS)
1000.0000 mL | Freq: Once | INTRAVENOUS | Status: AC
  Administered 2015-01-01: 1000 mL via INTRAVENOUS

## 2015-01-01 MED ORDER — FERROUS SULFATE 325 (65 FE) MG PO TABS: 325.0000 mg | ORAL_TABLET | Freq: Every day | ORAL | Status: DC

## 2015-01-01 MED ORDER — ONDANSETRON HCL 4 MG/2ML IJ SOLN
4.0000 mg | Freq: Once | INTRAMUSCULAR | Status: AC
  Administered 2015-01-01: 4 mg via INTRAVENOUS
  Filled 2015-01-01: qty 2

## 2015-01-01 MED ORDER — ACETAMINOPHEN 325 MG PO TABS
650.0000 mg | ORAL_TABLET | Freq: Four times a day (QID) | ORAL | Status: DC | PRN
  Administered 2015-01-02 (×2): 650 mg via ORAL
  Filled 2015-01-01 (×2): qty 2

## 2015-01-01 MED ORDER — ONDANSETRON HCL 4 MG/2ML IJ SOLN: 4.0000 mg | Freq: Four times a day (QID) | INTRAMUSCULAR | Status: DC | PRN

## 2015-01-01 MED ORDER — ACETAMINOPHEN 650 MG RE SUPP: 650.0000 mg | Freq: Four times a day (QID) | RECTAL | Status: DC | PRN

## 2015-01-01 MED ORDER — ONDANSETRON HCL 8 MG PO TABS: 8.0000 mg | ORAL_TABLET | ORAL | Status: DC | PRN

## 2015-01-01 MED ORDER — POTASSIUM CHLORIDE 10 MEQ/100ML IV SOLN
10.0000 meq | INTRAVENOUS | Status: AC
  Administered 2015-01-01 – 2015-01-02 (×3): 10 meq via INTRAVENOUS
  Filled 2015-01-01 (×3): qty 100

## 2015-01-01 MED ORDER — SODIUM CHLORIDE 0.9 % IV SOLN: INTRAVENOUS | Status: DC

## 2015-01-01 MED ORDER — HYDROMORPHONE HCL 1 MG/ML IJ SOLN: 0.5000 mg | INTRAMUSCULAR | Status: DC | PRN

## 2015-01-01 MED ORDER — PNEUMOCOCCAL VAC POLYVALENT 25 MCG/0.5ML IJ INJ
0.5000 mL | INJECTION | INTRAMUSCULAR | Status: AC
  Administered 2015-01-02: 0.5 mL via INTRAMUSCULAR
  Filled 2015-01-01: qty 0.5

## 2015-01-01 MED ORDER — POTASSIUM CHLORIDE CRYS ER 20 MEQ PO TBCR: 20.0000 meq | EXTENDED_RELEASE_TABLET | Freq: Two times a day (BID) | ORAL | Status: DC

## 2015-01-01 MED ORDER — ONDANSETRON HCL 4 MG PO TABS: 4.0000 mg | ORAL_TABLET | Freq: Four times a day (QID) | ORAL | Status: DC | PRN

## 2015-01-01 MED ORDER — SACCHAROMYCES BOULARDII 250 MG PO CAPS
250.0000 mg | ORAL_CAPSULE | Freq: Two times a day (BID) | ORAL | Status: DC
  Administered 2015-01-02 (×2): 250 mg via ORAL
  Filled 2015-01-01 (×4): qty 1

## 2015-01-01 MED ORDER — PANTOPRAZOLE SODIUM 40 MG IV SOLR
40.0000 mg | INTRAVENOUS | Status: DC
  Administered 2015-01-02: 40 mg via INTRAVENOUS
  Filled 2015-01-01: qty 40

## 2015-01-01 MED ORDER — POTASSIUM CHLORIDE CRYS ER 20 MEQ PO TBCR
40.0000 meq | EXTENDED_RELEASE_TABLET | Freq: Once | ORAL | Status: AC
  Administered 2015-01-01: 40 meq via ORAL
  Filled 2015-01-01: qty 2

## 2015-01-01 NOTE — ED Provider Notes (Signed)
CSN: [id number]     Arrival date & time 01/01/15  1636 History   First MD Initiated Contact with Patient 01/01/15 1656     Chief Complaint  Patient presents with  . Fever  . Emesis     (Consider location/radiation/quality/duration/timing/severity/associated sxs/prior Treatment) HPI......nausea, vomiting, diarrhea, fever for 3 days.  She reports a hx of heavy menstrual bleeding, but no black stool.  When she stands up, she gets dizzy and lightheaded. She has not been anemic in the past. Severity symptoms as moderate. Nothing makes symptoms better or worse. She has been prescribed Protonix and Zantac in the past  Past Medical History  Diagnosis Date  . Panic attacks    Past Surgical History  Procedure Laterality Date  . Sinus irrigation    . Tonsillectomy    . Tubal ligation    . Nasal sinus surgery    . Nasal bone surgery    . Colonoscopy N/A 06/24/2013    WUJ:[id number] hemorrhoids/otherwise normal   . Esophageal biopsy N/A 06/24/2013    Procedure: BIOPSY TERMINAL ILEUM;  Surgeon: Corbin Ade, MD;  Location: [facility];  Service: Endoscopy;  Laterality: N/A;   Family History  Problem Relation Age of Onset  . Diabetes Mother   . Hypertension Mother   . COPD Mother   . Diabetes Sister   . Hypertension Sister   . Cancer Other   . Crohn's disease Maternal Grandmother   . Colon cancer Neg Hx    Social History  Substance Use Topics  . Smoking status: Current Every Day Smoker -- 1.00 packs/day for 12 years    Types: Cigarettes  . Smokeless tobacco: Never Used  . Alcohol Use: 0.0 oz/week    4-5 Cans of beer per week     Comment: occ   OB History    Gravida Para Term Preterm AB TAB SAB Ectopic Multiple Living   Review of Systems  All other systems reviewed and are negative.     Allergies  Demerol  Home Medications   Prior to Admission medications   Medication Sig Start Date End Date Taking? Authorizing Provider  acetaminophen (TYLENOL) 500  MG tablet Take 500 mg by mouth every 6 (six) hours as needed for mild pain.   Yes Historical Provider, MD  pantoprazole (PROTONIX) 40 MG tablet Take 1 tablet (40 mg total) by mouth daily. 30 minutes before breakfast 06/08/13  Yes Nira Retort, NP  ALPRAZolam Prudy Feeler) 0.5 MG tablet Take 0.5 mg by mouth 2 (two) times daily as needed for anxiety or sleep.     Historical Provider, MD  ferrous sulfate 325 (65 FE) MG tablet Take 1 tablet (325 mg total) by mouth daily. 01/01/15   Donnetta Hutching, MD  ondansetron (ZOFRAN) 8 MG tablet Take 1 tablet (8 mg total) by mouth every 4 (four) hours as needed. 01/01/15   Donnetta Hutching, MD  potassium chloride SA (K-DUR,KLOR-CON) 20 MEQ tablet Take 1 tablet (20 mEq total) by mouth 2 (two) times daily. 01/01/15   Donnetta Hutching, MD  ranitidine (ZANTAC) 150 MG capsule Take 150 mg by mouth as needed for heartburn.    Historical Provider, MD   BP 119/70 mmHg  Pulse 106  Temp(Src) 100.6 F (38.1 C) (Oral)  Resp 16  Ht  (1.676 m)  Wt 185 lb (83.915 kg)  BMI 29.87 kg/m2  SpO2 99%  LMP 01/01/2015 Physical Exam  Constitutional:  She is oriented to person, place, and time. She appears well-developed and well-nourished.  HENT:  Head: Normocephalic and atraumatic.  Eyes: Conjunctivae and EOM are normal. Pupils are equal, round, and reactive to light.  Neck: Normal range of motion. Neck supple.  Cardiovascular: Normal rate and regular rhythm.   Pulmonary/Chest: Effort normal and breath sounds normal.  Abdominal: Soft. Bowel sounds are normal.  Genitourinary:  Rectal exam: No masses, heme-negative  Musculoskeletal: Normal range of motion.  Neurological: She is alert and oriented to person, place, and time.  Skin: Skin is warm and dry.  Psychiatric: She has a normal mood and affect. Her behavior is normal.  Nursing note and vitals reviewed.   ED Course  Procedures (including critical care time) Labs Review Labs Reviewed  CBC WITH DIFFERENTIAL/PLATELET - Abnormal; Notable for  the following:    WBC 10.7 (*)    Hemoglobin 7.6 (*)    HCT 27.3 (*)    MCV 68.1 (*)    MCH 19.0 (*)    MCHC 27.8 (*)    RDW 16.6 (*)    Neutro Abs 8.1 (*)    All other components within normal limits  COMPREHENSIVE METABOLIC PANEL - Abnormal; Notable for the following:    Sodium 134 (*)    Potassium 2.8 (*)    Glucose, Bld 101 (*)    BUN 5 (*)    All other components within normal limits  POC OCCULT BLOOD, ED    Imaging Review Dg Chest 2 View  01/01/2015  CLINICAL DATA:  Acute onset of left upper back pain. Vomiting and fever. Initial encounter. EXAM: CHEST  2 VIEW COMPARISON:  Chest radiograph performed 07/22/2011 FINDINGS: The lungs are well-aerated and clear. There is no evidence of focal opacification, pleural effusion or pneumothorax. The heart is normal in size; the mediastinal contour is within normal limits. No acute osseous abnormalities are seen. IMPRESSION: No acute cardiopulmonary process seen. No displaced rib fractures identified. Electronically Signed   By: Roanna Raider M.D.   On: 01/01/2015 18:21   I have personally reviewed and evaluated these images and lab results as part of my medical decision-making.   EKG Interpretation None      MDM   Final diagnoses:  Gastroenteritis  Anemia, unspecified anemia type  Hypokalemia   Her hemoglobin was normal in March 2015.   It is now 7.6.   She is lightheaded and dizzy upon standing.   Admit for transfusion and hydration.  Anemia secondary to ? heavy menstrual flow.      Donnetta Hutching, MD 01/01/15 2122

## 2015-01-01 NOTE — H&P (Signed)
[facility] Admission History and Physical       AREEJ TAYLER Banks:[id number] DOB: 09-02-1980 DOA: 01/01/2015  Referring physician: EDP PCP: Ernestine Conrad, MD  Specialists:   Chief Complaint: Fevers Chills and Nausea Vomiting and Diarrhea  HPI: Erica Banks is a 34 y.o. female with a history of GERD, and panic Attacks who presents to the ED with complaints of Fevers and Chills and Nausea Vomiting and Diarrhea x 3 days.  She denies any hematemsis, hematochezia or melena.  She does report having Lightheadedness and Dizziness today and feeling near syncopal.   She was evaluated in the ED and found to have a hemoglobin of 7.6, and previously the last recorded hemoglobin was    14.   An FOBT was done in the ED and was HEME Negative.  She was also found to have Hypokalemia ( K+ = 2.8).  She ws referred for admission.       Review of Systems:  Constitutional: No Weight Loss, No Weight Gain, Night Sweats, +Fevers, +Chills, Dizziness, Light Headedness, Fatigue, or Generalized Weakness HEENT: No Headaches, Difficulty Swallowing,Tooth/Dental Problems,Sore Throat,  No Sneezing, Rhinitis, Ear Ache, Nasal Congestion, or Post Nasal Drip,  Cardio-vascular:  No Chest pain, Orthopnea, PND, Edema in Lower Extremities, Anasarca, Dizziness, Palpitations  Resp: No Dyspnea, No DOE, No Productive Cough, No Non-Productive Cough, No Hemoptysis, No Wheezing.    GI: No Heartburn, Indigestion, +Abdominal Pain, +Nausea, +Vomiting, +Diarrhea, Constipation, Hematemesis, Hematochezia, Melena, Change in Bowel Habits,  Loss of Appetite  GU: No Dysuria, No Change in Color of Urine, No Urgency or Urinary Frequency, No Flank pain, +Menorrhagia.  Musculoskeletal: No Joint Pain or Swelling, No Decreased Range of Motion, No Back Pain.  Neurologic: No Syncope, No Seizures, Muscle Weakness, Paresthesia, Vision Disturbance or Loss, No Diplopia, No Vertigo, No Difficulty Walking,  Skin: No Rash or Lesions. Psych: No  Change in Mood or Affect, No Depression or Anxiety, No Memory loss, No Confusion, or Hallucinations   Past Medical History  Diagnosis Date  . Panic attacks        GERD   Past Surgical History  Procedure Laterality Date  . Sinus irrigation    . Tonsillectomy    . Tubal ligation    . Nasal sinus surgery    . Nasal bone surgery    . Colonoscopy N/A 06/24/2013    [id number] hemorrhoids/otherwise normal   . Esophageal biopsy N/A 06/24/2013    Procedure: BIOPSY TERMINAL ILEUM;  Surgeon: Corbin Ade, MD;  Location: [facility];  Service: Endoscopy;  Laterality: N/A;      Prior to Admission medications   Medication Sig Start Date End Date Taking? Authorizing Provider  acetaminophen (TYLENOL) 500 MG tablet Take 500 mg by mouth every 6 (six) hours as needed for mild pain.   Yes Historical Provider, MD  pantoprazole (PROTONIX) 40 MG tablet Take 1 tablet (40 mg total) by mouth daily. 30 minutes before breakfast 06/08/13  Yes Nira Retort, NP  ALPRAZolam Prudy Feeler) 0.5 MG tablet Take 0.5 mg by mouth 2 (two) times daily as needed for anxiety or sleep.     Historical Provider, MD  ferrous sulfate 325 (65 FE) MG tablet Take 1 tablet (325 mg total) by mouth daily. 01/01/15   Donnetta Hutching, MD  ondansetron (ZOFRAN) 8 MG tablet Take 1 tablet (8 mg total) by mouth every 4 (four) hours as needed. 01/01/15   Donnetta Hutching, MD  potassium chloride SA (K-DUR,KLOR-CON) 20 MEQ tablet Take 1 tablet (  20 mEq total) by mouth 2 (two) times daily. 01/01/15   Donnetta Hutching, MD  ranitidine (ZANTAC) 150 MG capsule Take 150 mg by mouth as needed for heartburn.    Historical Provider, MD     Allergies  Allergen Reactions  . Demerol Anaphylaxis    Per patient cardiac arrest    Social History:  reports that she has been smoking Cigarettes.  She has a 12 pack-year smoking history. She has never used smokeless tobacco. She reports that she drinks alcohol. She reports that she does not use illicit drugs.    Family History    Problem Relation Age of Onset  . Diabetes Mother   . Hypertension Mother   . COPD Mother   . Diabetes Sister   . Hypertension Sister   . Cancer Other   . Crohn's disease Maternal Grandmother   . Colon cancer Neg Hx        Physical Exam:  GEN:  Pleasant Overweight 34 y.o. Caucasian female examined and in no acute distress; cooperative with exam Filed Vitals:   01/01/15 1642 01/01/15 2059  BP: 119/70 129/75  Pulse: 106 87  Temp: 100.6 F (38.1 C)   TempSrc: Oral   Resp: 16 18  Height: 5\' 6"  (1.676 m)   Weight: 83.915 kg (185 lb)   SpO2: 99% 99%   Blood pressure 129/75, pulse 87, temperature 100.6 F (38.1 C), temperature source Oral, resp. rate 18, height 5\' 6"  (1.676 m), weight 83.915 kg (185 lb), last menstrual period 01/01/2015, SpO2 99 %. PSYCH: She is alert and oriented x4; does not appear anxious does not appear depressed; affect is normal HEENT: Normocephalic and Atraumatic, Mucous membranes pink; PERRLA; EOM intact; Fundi:  Benign;  No scleral icterus, Nares: Patent, Oropharynx: Clear, Fair Dentition,    Neck:  FROM, No Cervical Lymphadenopathy nor Thyromegaly or Carotid Bruit; No JVD; Breasts:: Not examined CHEST WALL: No tenderness CHEST: Normal respiration, clear to auscultation bilaterally HEART: Regular rate and rhythm; no murmurs rubs or gallops BACK: No kyphosis or scoliosis; No CVA tenderness ABDOMEN: Positive Bowel Sounds, Soft Non-Tender, No Rebound or Guarding; No Masses, No Organomegaly. Rectal Exam: Not done EXTREMITIES: No Cyanosis, Clubbing, or Edema; No Ulcerations. Genitalia: not examined PULSES: 2+ and symmetric SKIN: Normal hydration no rash or ulceration CNS:  Alert and Oriented x 4, No Focal Deficits Vascular: pulses palpable throughout    Labs on Admission:  Basic Metabolic Panel:  Recent Labs Lab 01/01/15 1710  NA 134*  K 2.8*  CL 101  CO2 24  GLUCOSE 101*  BUN 5*  CREATININE 0.86  CALCIUM 9.0   Liver Function  Tests:  Recent Labs Lab 01/01/15 1710  AST 22  ALT 21  ALKPHOS 52  BILITOT 0.6  PROT 7.6  ALBUMIN 3.8   No results for input(s): LIPASE, AMYLASE in the last 168 hours. No results for input(s): AMMONIA in the last 168 hours. CBC:  Recent Labs Lab 01/01/15 1710  WBC 10.7*  NEUTROABS 8.1*  HGB 7.6*  HCT 27.3*  MCV 68.1*  PLT 379   Cardiac Enzymes: No results for input(s): CKTOTAL, CKMB, CKMBINDEX, TROPONINI in the last 168 hours.  BNP (last 3 results) No results for input(s): BNP in the last 8760 hours.  ProBNP (last 3 results) No results for input(s): PROBNP in the last 8760 hours.  CBG: No results for input(s): GLUCAP in the last 168 hours.  Radiological Exams on Admission: Dg Chest 2 View  01/01/2015  CLINICAL DATA:  Acute  onset of left upper back pain. Vomiting and fever. Initial encounter. EXAM: CHEST  2 VIEW COMPARISON:  Chest radiograph performed 07/22/2011 FINDINGS: The lungs are well-aerated and clear. There is no evidence of focal opacification, pleural effusion or pneumothorax. The heart is normal in size; the mediastinal contour is within normal limits. No acute osseous abnormalities are seen. IMPRESSION: No acute cardiopulmonary process seen. No displaced rib fractures identified. Electronically Signed   By: Roanna Raider M.D.   On: 01/01/2015 18:21      Assessment/Plan:      34 y.o. female with  Principal Problem:   1.     Symptomatic anemia- FOBT : HEME Negative, Most Likely due to Menorrhagia   Transfuse    Monitor H/Hs   Anemia panel ordered   Active Problems:    2.    Hypokalemia- due to N+V+D.      Replete K+   Check Magnesium     3.    Acute gastroenteritis   Supportive Care   IVFs     4.    GERD (gastroesophageal reflux disease)   Protonix     5.    DVT Prophylaxis   SCDs    Code Status:     FULL CODE     Family Communication:   Family at Bedside   Disposition Plan:    Inpatient Status        Time spent:    69 Minutes       Ron Parker [facility] Pager [phone]   If 7AM -7PM Please Contact the Day Rounding Team MD for [facility]  If 7PM-7AM, Please Contact Night-Floor Coverage  [email or web address] Password [email or web address] 01/01/2015, 9:25 PM     ADDENDUM:   Patient was seen and examined on 01/01/2015

## 2015-01-01 NOTE — ED Notes (Signed)
Blood consent form signed by patient.

## 2015-01-01 NOTE — ED Notes (Signed)
Stool hemocult negative

## 2015-01-01 NOTE — Discharge Instructions (Signed)
You're very anemic. Hemoglobin was 7.6. You will need to get a primary care physician. Also your potassium is low. Prescriptions for iron, potassium, nausea medicine.  Increase fluids.

## 2015-01-01 NOTE — ED Notes (Signed)
Per patient started with fever Friday of 103. Saturday nausea, vomiting and diarrhea started with fevers 100. Patient denies any vomiting since Saturday but still reports diarrhea and fevers. Patient now states that she becomes dizzy with position changes.

## 2015-01-01 NOTE — ED Notes (Signed)
Report given to Tandra RN 

## 2015-01-02 DIAGNOSIS — K529 Noninfective gastroenteritis and colitis, unspecified: Secondary | ICD-10-CM

## 2015-01-02 DIAGNOSIS — E876 Hypokalemia: Secondary | ICD-10-CM

## 2015-01-02 DIAGNOSIS — D649 Anemia, unspecified: Secondary | ICD-10-CM

## 2015-01-02 DIAGNOSIS — D500 Iron deficiency anemia secondary to blood loss (chronic): Principal | ICD-10-CM

## 2015-01-02 LAB — BASIC METABOLIC PANEL
Anion gap: 7 (ref 5–15)
BUN: 5 mg/dL — AB (ref 6–20)
CALCIUM: 8.3 mg/dL — AB (ref 8.9–10.3)
CHLORIDE: 111 mmol/L (ref 101–111)
CO2: 20 mmol/L — AB (ref 22–32)
CREATININE: 0.72 mg/dL (ref 0.44–1.00)
GFR calc non Af Amer: >60 mL/min (ref >60)
GLUCOSE: 119 mg/dL — AB (ref 65–99)
Potassium: 3.4 mmol/L — ABNORMAL LOW (ref 3.5–5.1)
Sodium: 138 mmol/L (ref 135–145)

## 2015-01-02 LAB — HEMOGLOBIN AND HEMATOCRIT, BLOOD
HCT: 29 % — ABNORMAL LOW (ref 36.0–46.0)
HEMOGLOBIN: 8.4 g/dL — AB (ref 12.0–15.0)

## 2015-01-02 LAB — FERRITIN: FERRITIN: 10 ng/mL — AB (ref 11–307)

## 2015-01-02 LAB — CBC
HCT: 29.7 % — ABNORMAL LOW (ref 36.0–46.0)
Hemoglobin: 8.7 g/dL — ABNORMAL LOW (ref 12.0–15.0)
MCH: 21.2 pg — AB (ref 26.0–34.0)
MCHC: 29.3 g/dL — ABNORMAL LOW (ref 30.0–36.0)
MCV: 72.4 fL — AB (ref 78.0–100.0)
PLATELETS: 291 10*3/uL (ref 150–400)
RBC: 4.1 MIL/uL (ref 3.87–5.11)
RDW: 19.2 % — AB (ref 11.5–15.5)
WBC: 7.6 10*3/uL (ref 4.0–10.5)

## 2015-01-02 LAB — IRON AND TIBC
Iron: 10 ug/dL — ABNORMAL LOW (ref 28–170)
SATURATION RATIOS: 2 % — AB (ref 10.4–31.8)
TIBC: 455 ug/dL — AB (ref 250–450)
UIBC: 445 ug/dL

## 2015-01-02 LAB — OCCULT BLOOD, POC DEVICE: Fecal Occult Bld: NEGATIVE

## 2015-01-02 LAB — FOLATE: Folate: 8.8 ng/mL (ref >5.9)

## 2015-01-02 LAB — VITAMIN B12: Vitamin B-12: 932 pg/mL — ABNORMAL HIGH (ref 180–914)

## 2015-01-02 MED ORDER — MAGNESIUM SULFATE 2 GM/50ML IV SOLN
2.0000 g | Freq: Once | INTRAVENOUS | Status: AC
  Administered 2015-01-02: 2 g via INTRAVENOUS
  Filled 2015-01-02: qty 50

## 2015-01-02 MED ORDER — FERROUS GLUCONATE 324 (38 FE) MG PO TABS: 324.0000 mg | ORAL_TABLET | Freq: Two times a day (BID) | ORAL | Status: DC

## 2015-01-02 MED ORDER — FERROUS GLUCONATE 324 (38 FE) MG PO TABS
324.0000 mg | ORAL_TABLET | Freq: Two times a day (BID) | ORAL | Status: DC
  Administered 2015-01-02: 324 mg via ORAL
  Filled 2015-01-02 (×4): qty 1

## 2015-01-02 MED ORDER — POTASSIUM CHLORIDE CRYS ER 20 MEQ PO TBCR
40.0000 meq | EXTENDED_RELEASE_TABLET | Freq: Once | ORAL | Status: AC
  Administered 2015-01-02: 40 meq via ORAL
  Filled 2015-01-02: qty 2

## 2015-01-02 NOTE — Progress Notes (Signed)
Left room via wheelchair for discharge home in care of husband.  Out of work note given patient.  Stable at discharge.

## 2015-01-02 NOTE — Progress Notes (Signed)
Central telemetry notified patient being discharged home.  IV access removed.  Discharge instructions reviewed with patient, questions answered.

## 2015-01-02 NOTE — Discharge Summary (Addendum)
Physician Discharge Summary  Erica Banks ZOX:[id number] DOB: 06-05-1980 DOA: 01/01/2015  PCP: Ernestine Conrad, MD  Admit date: 01/01/2015 Discharge date: 01/02/2015  Time spent: 15 minutes  Recommendations for Outpatient Follow-up:  1. Follow up with PCP within 1 week for resolution of anemia and acute gastroenteritis.  2. Follow up with gynecology scheduled for 12/6 for further evaluation of menorrhagia     Discharge Diagnoses:  Principal Problem:   Symptomatic anemia Active Problems:   GERD (gastroesophageal reflux disease)   Anemia   Hypokalemia   Acute gastroenteritis  Discharge Condition: Improved   Diet recommendation: Heart healthy   Filed Weights   01/01/15 1642 01/01/15 2318  Weight: 83.915 kg (185 lb) 84.369 kg (186 lb)    History of present illness:  40 yof with past medical history of GERD presented with complaints of fever, chills, nausea, vomiting, and diarrhea persistent for 3 days. Evaluation in the ED revealed an Hgb of 7.4 and she was found to hypokalemic. Admitted for further treatment of anemia and hypokalemia.   Hospital Course:  Microcytic anemia likely related to chronic blood loss. Patient denies history of GI bleeding and stool for occult blood was negative. She does admit to menorrhagia. She was transfused 2units PRBC on admission with improvement in her symptoms. Will plan on discharging with iron supplements. Will also plan outpatient GYN referral.   Patient also had significant NVD likely related to viral gastroenteritis. Vomiting appears to have resolved and she continues to have some diarrhea. Will treat supportively with Imodium. Will discontinue PPI in light of diarrhea and continue treatment of GERD with H2 blockers  Hypokalemia, related to GI losses. This has been replaced.   Procedures:  None  Consultations:  None  Discharge Exam: Filed Vitals:   01/02/15 0632 01/02/15 1439  BP: 112/64 122/68  Pulse: 77 79  Temp: 98.6 F (37 C)  98.4 F (36.9 C)  Resp: 16 18    General: NAD, looks comfortable  Cardiovascular: RRR, S1, S2   Respiratory: clear bilaterally, No wheezing, rales or rhonchi  Abdomen: soft, non tender, no distention , bowel sounds normal  Musculoskeletal: No edema b/l   Discharge Instructions   Discharge Instructions    Diet - low sodium heart healthy    Complete by:  As directed      Increase activity slowly    Complete by:  As directed           Current Discharge Medication List    START taking these medications   Details  ferrous gluconate (FERGON) 324 MG tablet Take 1 tablet (324 mg total) by mouth 2 (two) times daily with a meal. Qty: 60 tablet, Refills: 3    ondansetron (ZOFRAN) 8 MG tablet Take 1 tablet (8 mg total) by mouth every 4 (four) hours as needed. Qty: 10 tablet, Refills: 0      CONTINUE these medications which have NOT CHANGED   Details  acetaminophen (TYLENOL) 500 MG tablet Take 500 mg by mouth every 6 (six) hours as needed for mild pain.    ALPRAZolam (XANAX) 0.5 MG tablet Take 0.5 mg by mouth 2 (two) times daily as needed for anxiety or sleep.     ranitidine (ZANTAC) 150 MG capsule Take 150 mg by mouth as needed for heartburn.      STOP taking these medications     pantoprazole (PROTONIX) 40 MG tablet        Allergies  Allergen Reactions  . Demerol Anaphylaxis  Per patient cardiac arrest   Follow-up Information    Follow up with Lazaro Arms, MD On 01/03/2015.   Specialties:  Obstetrics and Gynecology, Radiology   Why:  3:15pm   Contact information:   [address] [place] [address] [phone]       Follow up with Ernestine Conrad, MD. Schedule an appointment as soon as possible for a visit in 2 weeks.   Specialty:  Family Medicine   Why:  repeat CBC on follow up   Contact information:   [address] [place] [address] [phone]        The results of significant diagnostics from this hospitalization (including  imaging, microbiology, ancillary and laboratory) are listed below for reference.    Significant Diagnostic Studies: Dg Chest 2 View  01/01/2015  CLINICAL DATA:  Acute onset of left upper back pain. Vomiting and fever. Initial encounter. EXAM: CHEST  2 VIEW COMPARISON:  Chest radiograph performed 07/22/2011 FINDINGS: The lungs are well-aerated and clear. There is no evidence of focal opacification, pleural effusion or pneumothorax. The heart is normal in size; the mediastinal contour is within normal limits. No acute osseous abnormalities are seen. IMPRESSION: No acute cardiopulmonary process seen. No displaced rib fractures identified. Electronically Signed   By: Roanna Raider M.D.   On: 01/01/2015 18:21    Microbiology: No results found for this or any previous visit (from the past 240 hour(s)).   Labs: Basic Metabolic Panel:  Recent Labs Lab 01/01/15 1710 01/01/15 2140 01/02/15 0847  NA 134*  --  138  K 2.8*  --  3.4*  CL 101  --  111  CO2 24  --  20*  GLUCOSE 101*  --  119*  BUN 5*  --  5*  CREATININE 0.86  --  0.72  CALCIUM 9.0  --  8.3*  MG  --  1.6*  --    Liver Function Tests:  Recent Labs Lab 01/01/15 1710  AST 22  ALT 21  ALKPHOS 52  BILITOT 0.6  PROT 7.6  ALBUMIN 3.8  CBC:  Recent Labs Lab 01/01/15 1710 01/02/15 0847  WBC 10.7* 7.6  NEUTROABS 8.1*  --   HGB 7.6* 8.7*  HCT 27.3* 29.7*  MCV 68.1* 72.4*  PLT 379 291    Signed:  Erick Blinks, MD  [facility] 01/02/2015, 3:48 PM   By signing my name below, I, Zadie Cleverly, attest that this documentation has been prepared under the direction and in the presence of Erick Blinks, MD. Electronically signed: Zadie Cleverly, Scribe. 01/02/2015 11:20 am   I, Dr. Erick Blinks, personally performed the services described in this documentaiton. All medical record entries made by the scribe were at my direction and in my presence. I have reviewed the chart and agree that the record reflects  my personal performance and is accurate and complete  Erick Blinks, MD, 01/02/2015 3:48 PM

## 2015-01-03 ENCOUNTER — Encounter: Payer: Self-pay | Admitting: Obstetrics & Gynecology

## 2015-01-03 LAB — TYPE AND SCREEN
ABO/RH(D): O POS
ANTIBODY SCREEN: NEGATIVE
UNIT DIVISION: 0
Unit division: 0

## 2016-05-26 IMAGING — US US EXTREM LOW VENOUS*L*
1 series · 13 of 24 positions shown · non-contrast
Comparison: CT Abdomen and Pelvis [DATE]

CLINICAL DATA: 36-year-old female with left leg pain and edema for
several days. No known INJURY.



[Series 1: us extrem low venous*left* · 0.07mm/px · 13 of 35 slices shown]
[im 1/35]
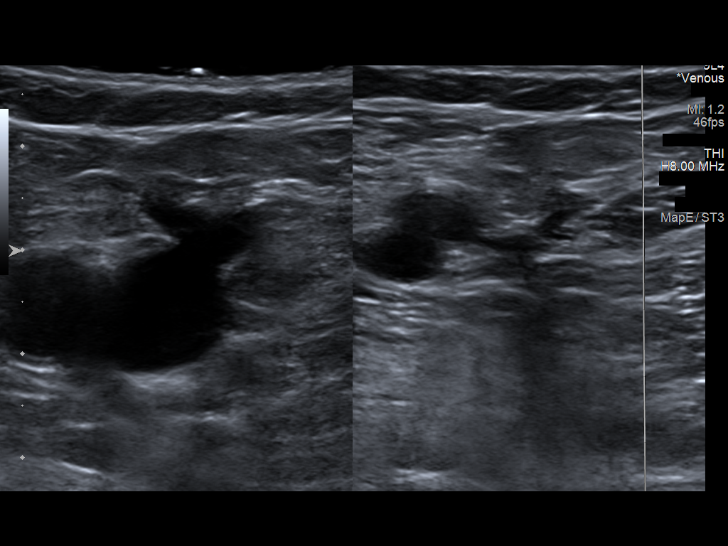
[im 3/35]
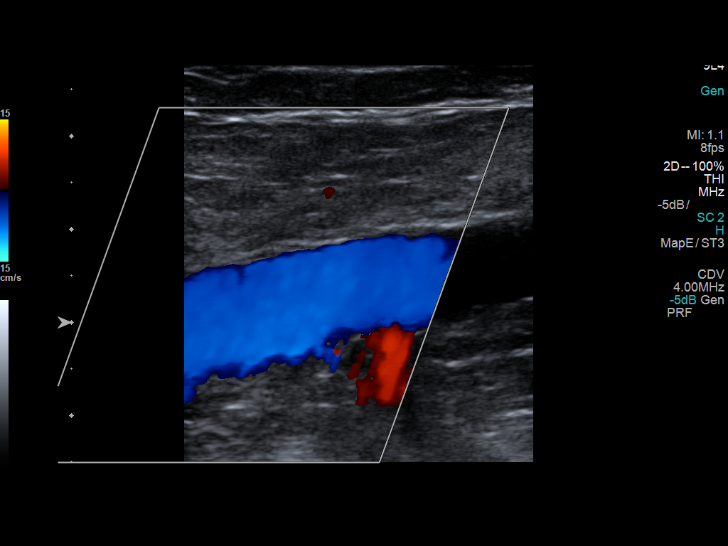
[im 6/35]
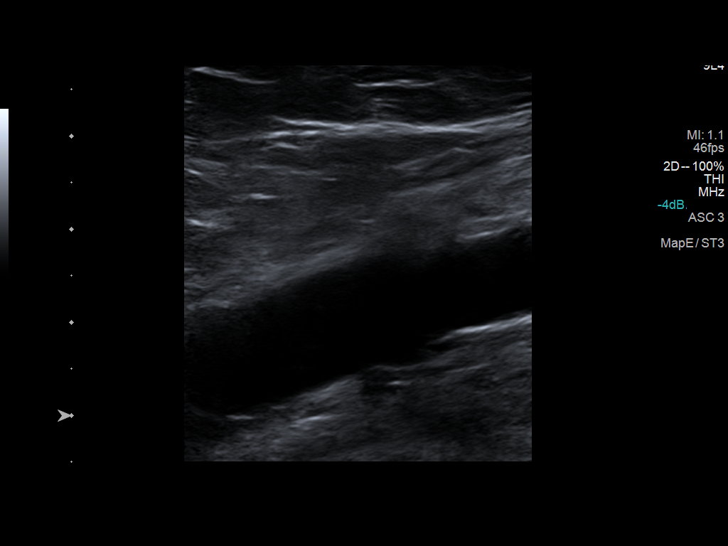
[im 9/35]
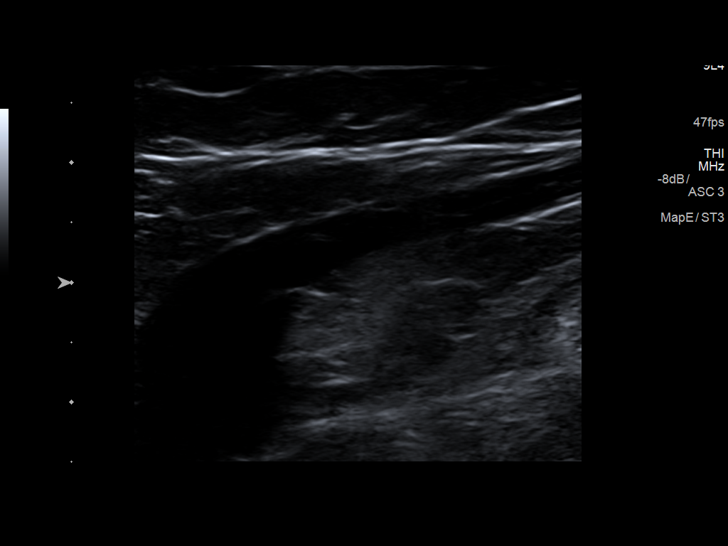
[im 12/35]
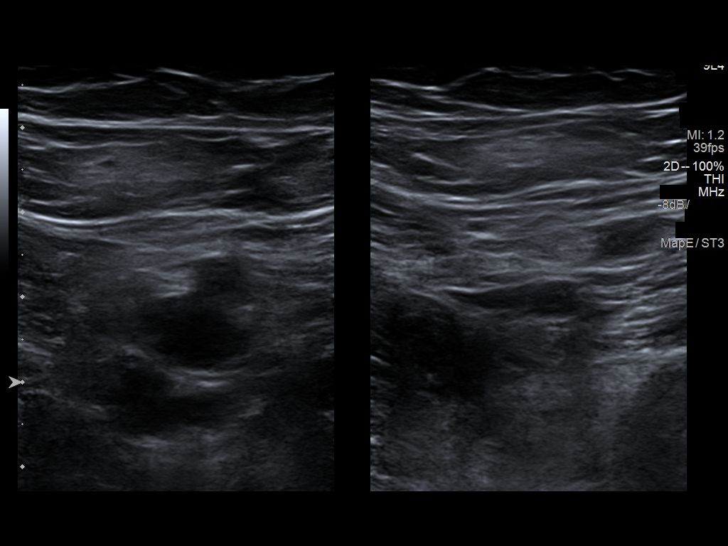
[im 15/35]
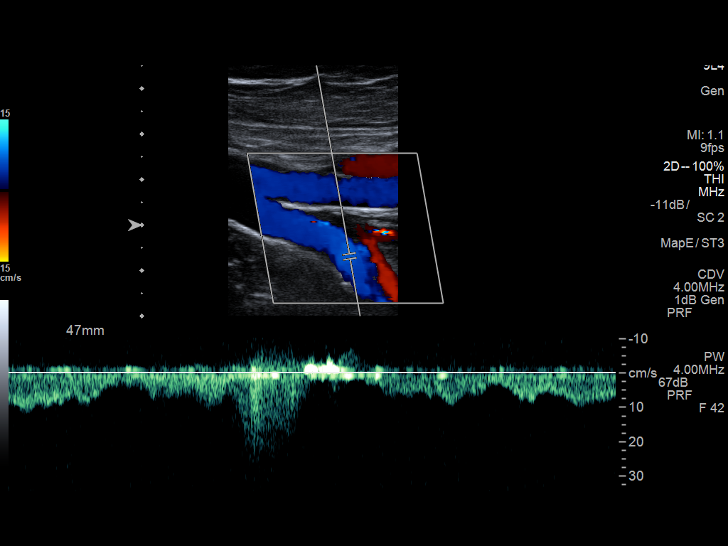
[im 18/35]
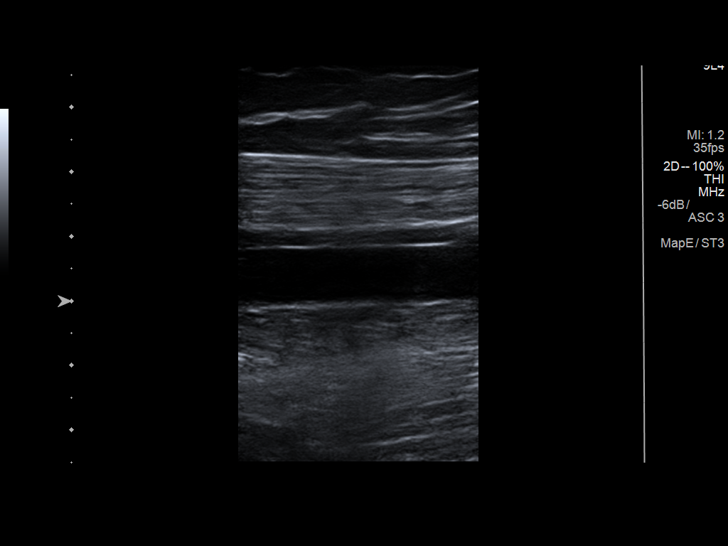
[im 20/35]
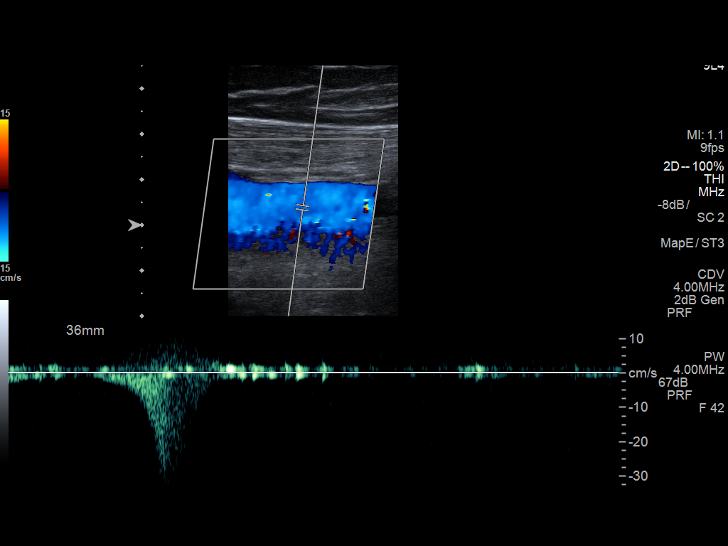
[im 23/35]
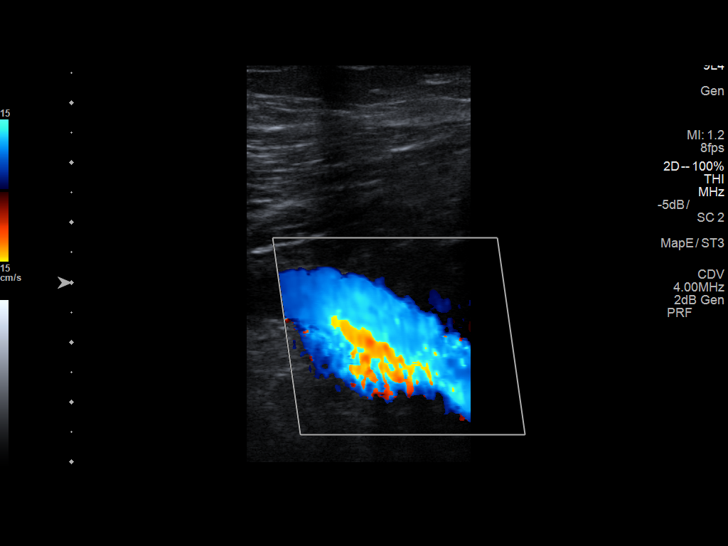
[im 26/35]
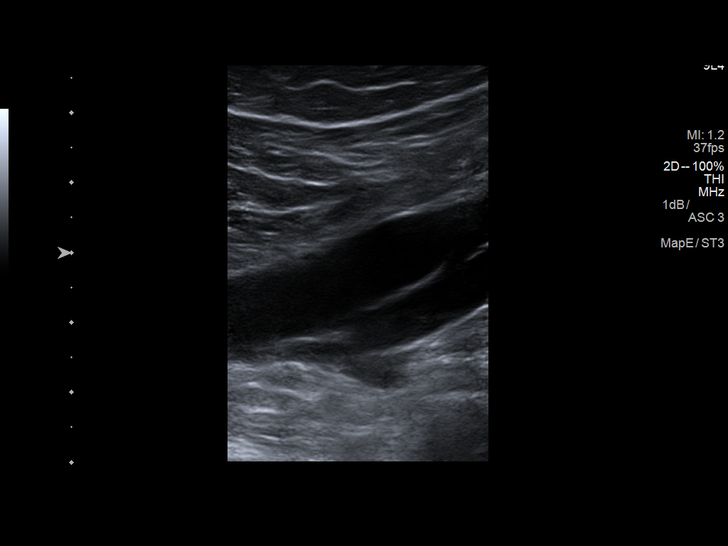
[im 29/35]
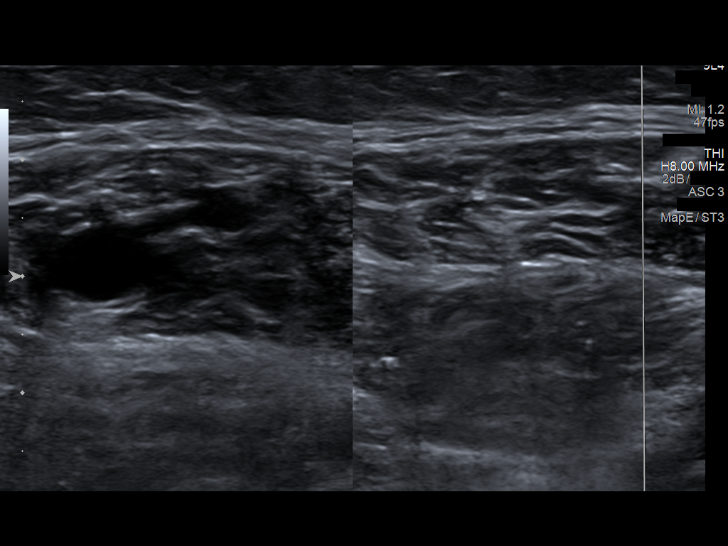
[im 32/35]
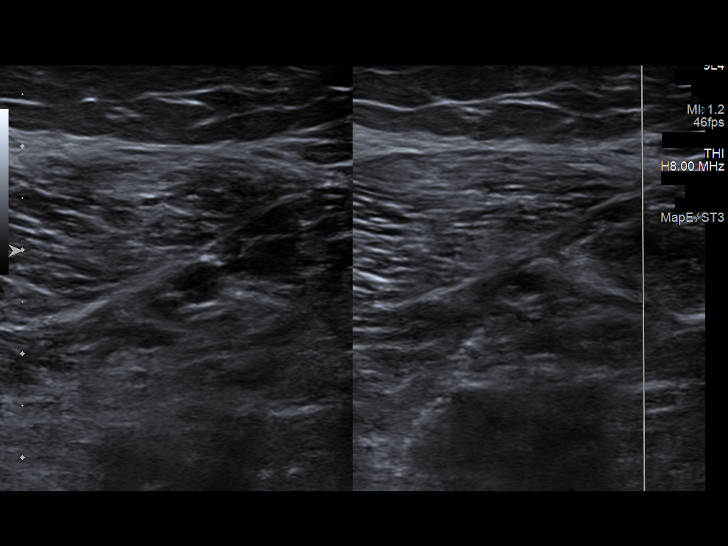
[im 35/35]
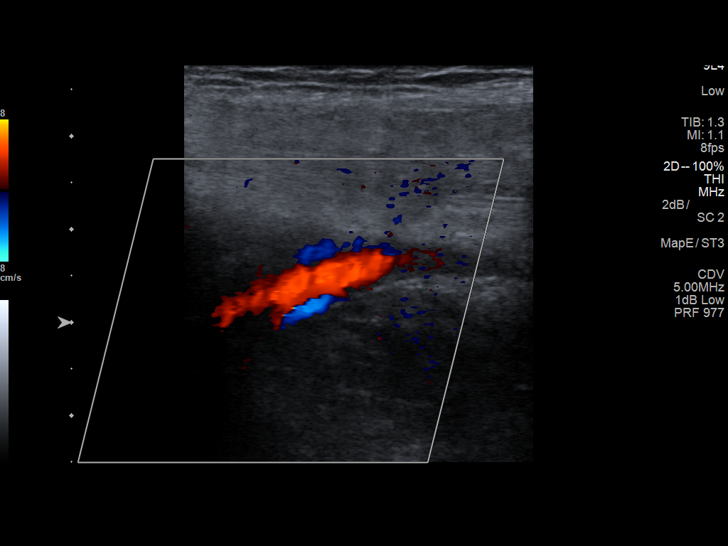

[13 of 24 positions shown; findings below may reference images not displayed]

FINDINGS: Contralateral Common Femoral Vein: Respiratory phasicity is normal
and symmetric with the symptomatic side. No evidence of thrombus.
Normal compressibility.

Common Femoral Vein: No evidence of thrombus. Normal
compressibility, respiratory phasicity and response to augmentation.

Saphenofemoral Junction: No evidence of thrombus. Normal
compressibility and flow on color Doppler imaging.

Profunda Femoral Vein: No evidence of thrombus. Normal
compressibility and flow on color Doppler imaging.

Femoral Vein: No evidence of thrombus. Normal compressibility,
respiratory phasicity and response to augmentation.

Popliteal Vein: No evidence of thrombus. Normal compressibility,
respiratory phasicity and response to augmentation.

Calf Veins: No evidence of thrombus. Normal compressibility and flow
on color Doppler imaging.

Superficial Great Saphenous Vein: No evidence of thrombus. Normal
compressibility.

Venous Reflux:  None.

Other Findings:  None.
IMPRESSION: No evidence of left lower extremity deep venous thrombosis.

## 2016-12-30 ENCOUNTER — Encounter (HOSPITAL_COMMUNITY): Payer: Self-pay | Admitting: Emergency Medicine

## 2016-12-30 ENCOUNTER — Other Ambulatory Visit: Payer: Self-pay

## 2016-12-30 ENCOUNTER — Emergency Department (HOSPITAL_COMMUNITY): Payer: Self-pay

## 2016-12-30 ENCOUNTER — Emergency Department (HOSPITAL_COMMUNITY)
Admission: EM | Admit: 2016-12-30 | Discharge: 2016-12-30 | Disposition: A | Discharge status: Home / Self Care | Payer: Self-pay | Attending: Emergency Medicine | Admitting: Emergency Medicine

## 2016-12-30 DIAGNOSIS — F17210 Nicotine dependence, cigarettes, uncomplicated: Secondary | ICD-10-CM | POA: Insufficient documentation

## 2016-12-30 DIAGNOSIS — E876 Hypokalemia: Secondary | ICD-10-CM | POA: Insufficient documentation

## 2016-12-30 DIAGNOSIS — Z79899 Other long term (current) drug therapy: Secondary | ICD-10-CM | POA: Insufficient documentation

## 2016-12-30 DIAGNOSIS — R52 Pain, unspecified: Secondary | ICD-10-CM

## 2016-12-30 DIAGNOSIS — D500 Iron deficiency anemia secondary to blood loss (chronic): Secondary | ICD-10-CM | POA: Insufficient documentation

## 2016-12-30 DIAGNOSIS — M79662 Pain in left lower leg: Secondary | ICD-10-CM | POA: Insufficient documentation

## 2016-12-30 LAB — COMPREHENSIVE METABOLIC PANEL
ALK PHOS: 59 U/L (ref 38–126)
ALT: 20 U/L (ref 14–54)
ANION GAP: 8 (ref 5–15)
AST: 27 U/L (ref 15–41)
Albumin: 3.9 g/dL (ref 3.5–5.0)
BILIRUBIN TOTAL: 0.5 mg/dL (ref 0.3–1.2)
BUN: <5 mg/dL — ABNORMAL LOW (ref 6–20)
CALCIUM: 8.9 mg/dL (ref 8.9–10.3)
CO2: 27 mmol/L (ref 22–32)
Chloride: 104 mmol/L (ref 101–111)
Creatinine, Ser: 0.6 mg/dL (ref 0.44–1.00)
GFR calc non Af Amer: >60 mL/min (ref >60)
Glucose, Bld: 107 mg/dL — ABNORMAL HIGH (ref 65–99)
Potassium: 2.9 mmol/L — ABNORMAL LOW (ref 3.5–5.1)
SODIUM: 139 mmol/L (ref 135–145)
TOTAL PROTEIN: 7.3 g/dL (ref 6.5–8.1)

## 2016-12-30 LAB — URINALYSIS, ROUTINE W REFLEX MICROSCOPIC
BILIRUBIN URINE: NEGATIVE
Glucose, UA: NEGATIVE mg/dL
Ketones, ur: NEGATIVE mg/dL
NITRITE: NEGATIVE
PH: 7 (ref 5.0–8.0)
Protein, ur: NEGATIVE mg/dL
SPECIFIC GRAVITY, URINE: 1.005 (ref 1.005–1.030)

## 2016-12-30 LAB — CBC WITH DIFFERENTIAL/PLATELET
Basophils Absolute: 0.1 10*3/uL (ref 0.0–0.1)
Basophils Relative: 1 %
EOS ABS: 0.1 10*3/uL (ref 0.0–0.7)
Eosinophils Relative: 1 %
HEMATOCRIT: 26.2 % — AB (ref 36.0–46.0)
HEMOGLOBIN: 6.8 g/dL — AB (ref 12.0–15.0)
LYMPHS ABS: 2.4 10*3/uL (ref 0.7–4.0)
Lymphocytes Relative: 25 %
MCH: 17.5 pg — AB (ref 26.0–34.0)
MCHC: 26 g/dL — AB (ref 30.0–36.0)
MCV: 67.5 fL — ABNORMAL LOW (ref 78.0–100.0)
MONO ABS: 0.6 10*3/uL (ref 0.1–1.0)
Monocytes Relative: 6 %
NEUTROS PCT: 67 %
Neutro Abs: 6.4 10*3/uL (ref 1.7–7.7)
Platelets: 386 10*3/uL (ref 150–400)
RBC: 3.88 MIL/uL (ref 3.87–5.11)
RDW: 18.3 % — ABNORMAL HIGH (ref 11.5–15.5)
WBC: 9.5 10*3/uL (ref 4.0–10.5)

## 2016-12-30 LAB — POC URINE PREG, ED: Preg Test, Ur: NEGATIVE

## 2016-12-30 LAB — POC OCCULT BLOOD, ED: Fecal Occult Bld: NEGATIVE

## 2016-12-30 IMAGING — DX DG CHEST 2V
2 series · 2 of 2 positions shown · non-contrast
Comparison: [DATE]

CLINICAL DATA: Worsening dyspnea with cough and clear phlegm.

EXAM:
CHEST  2 VIEW

[chest pa]
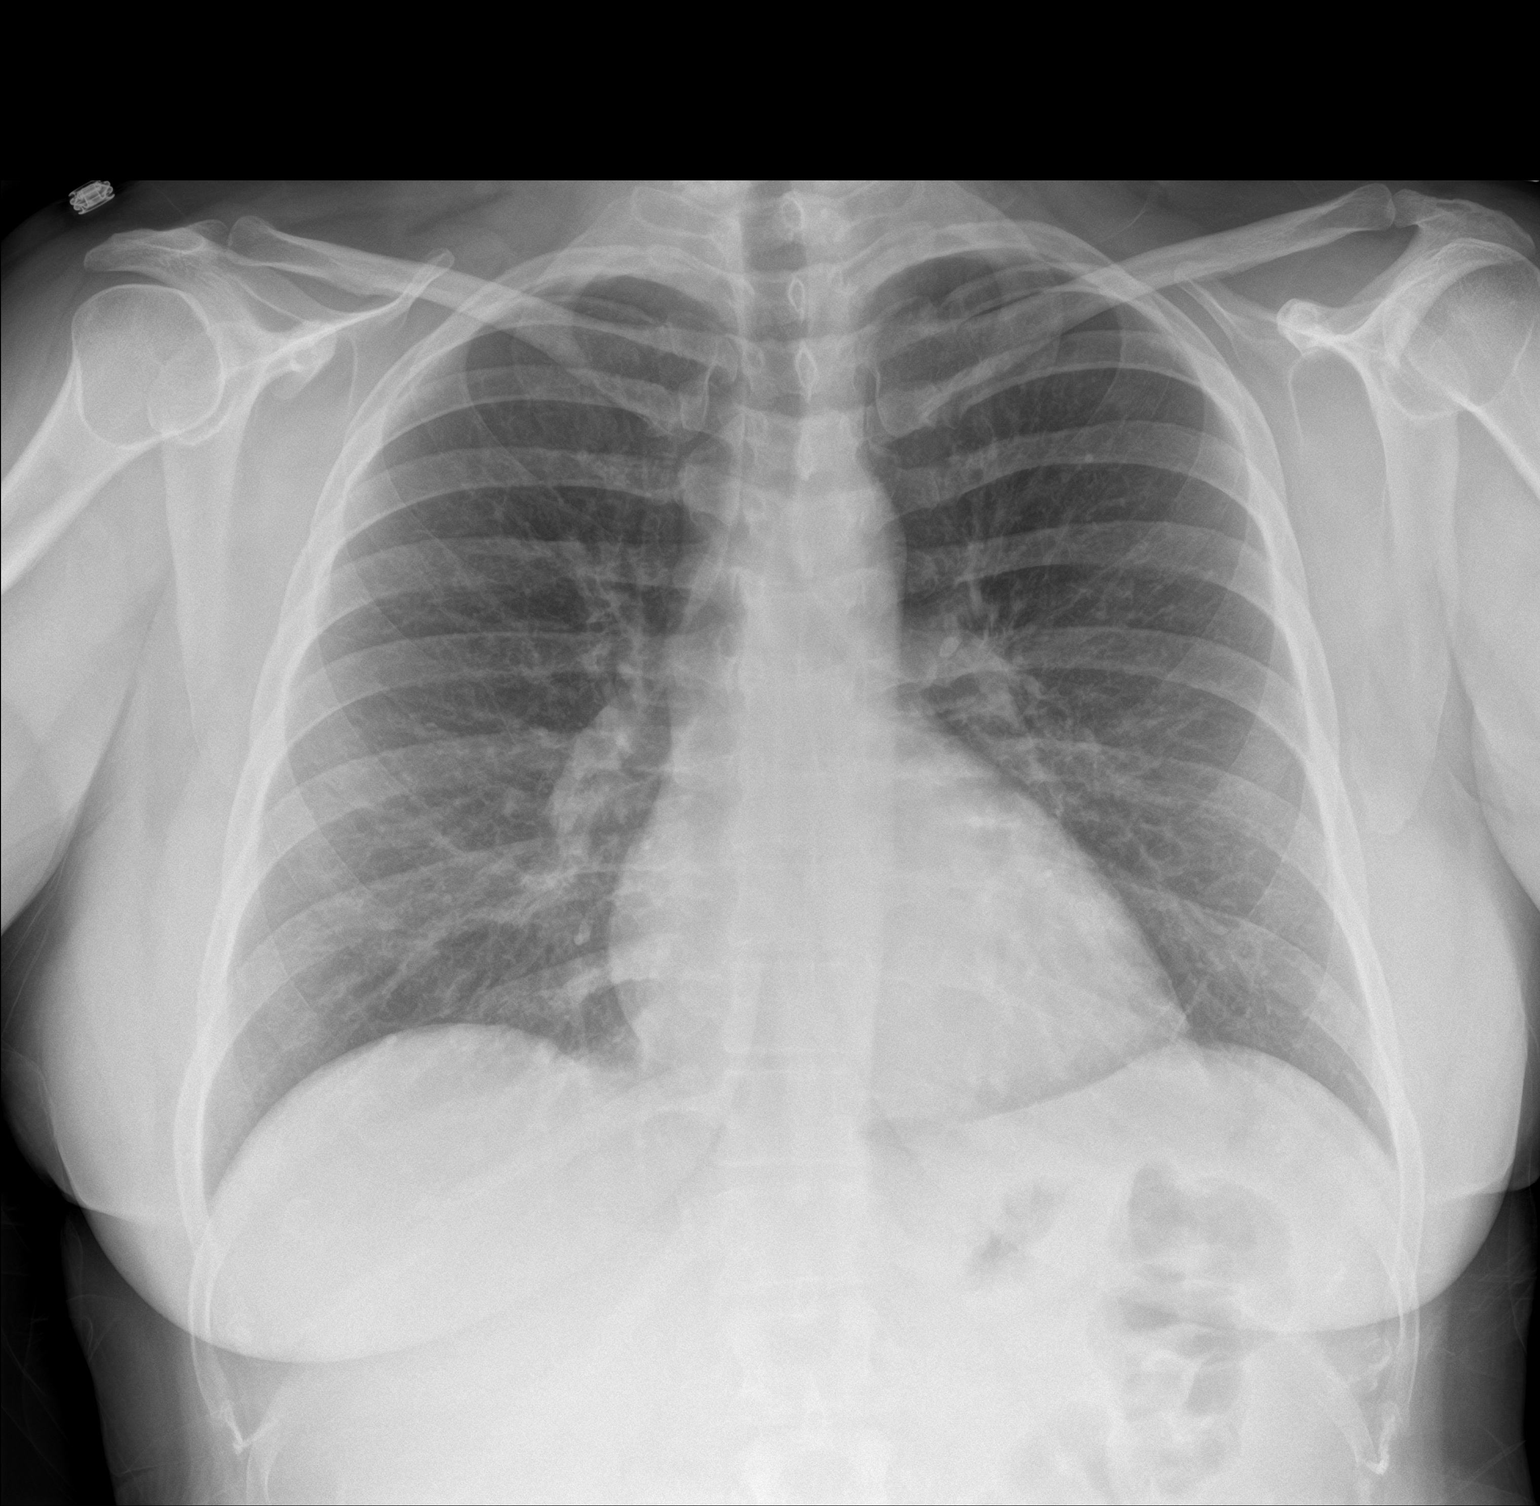

[chest lat]
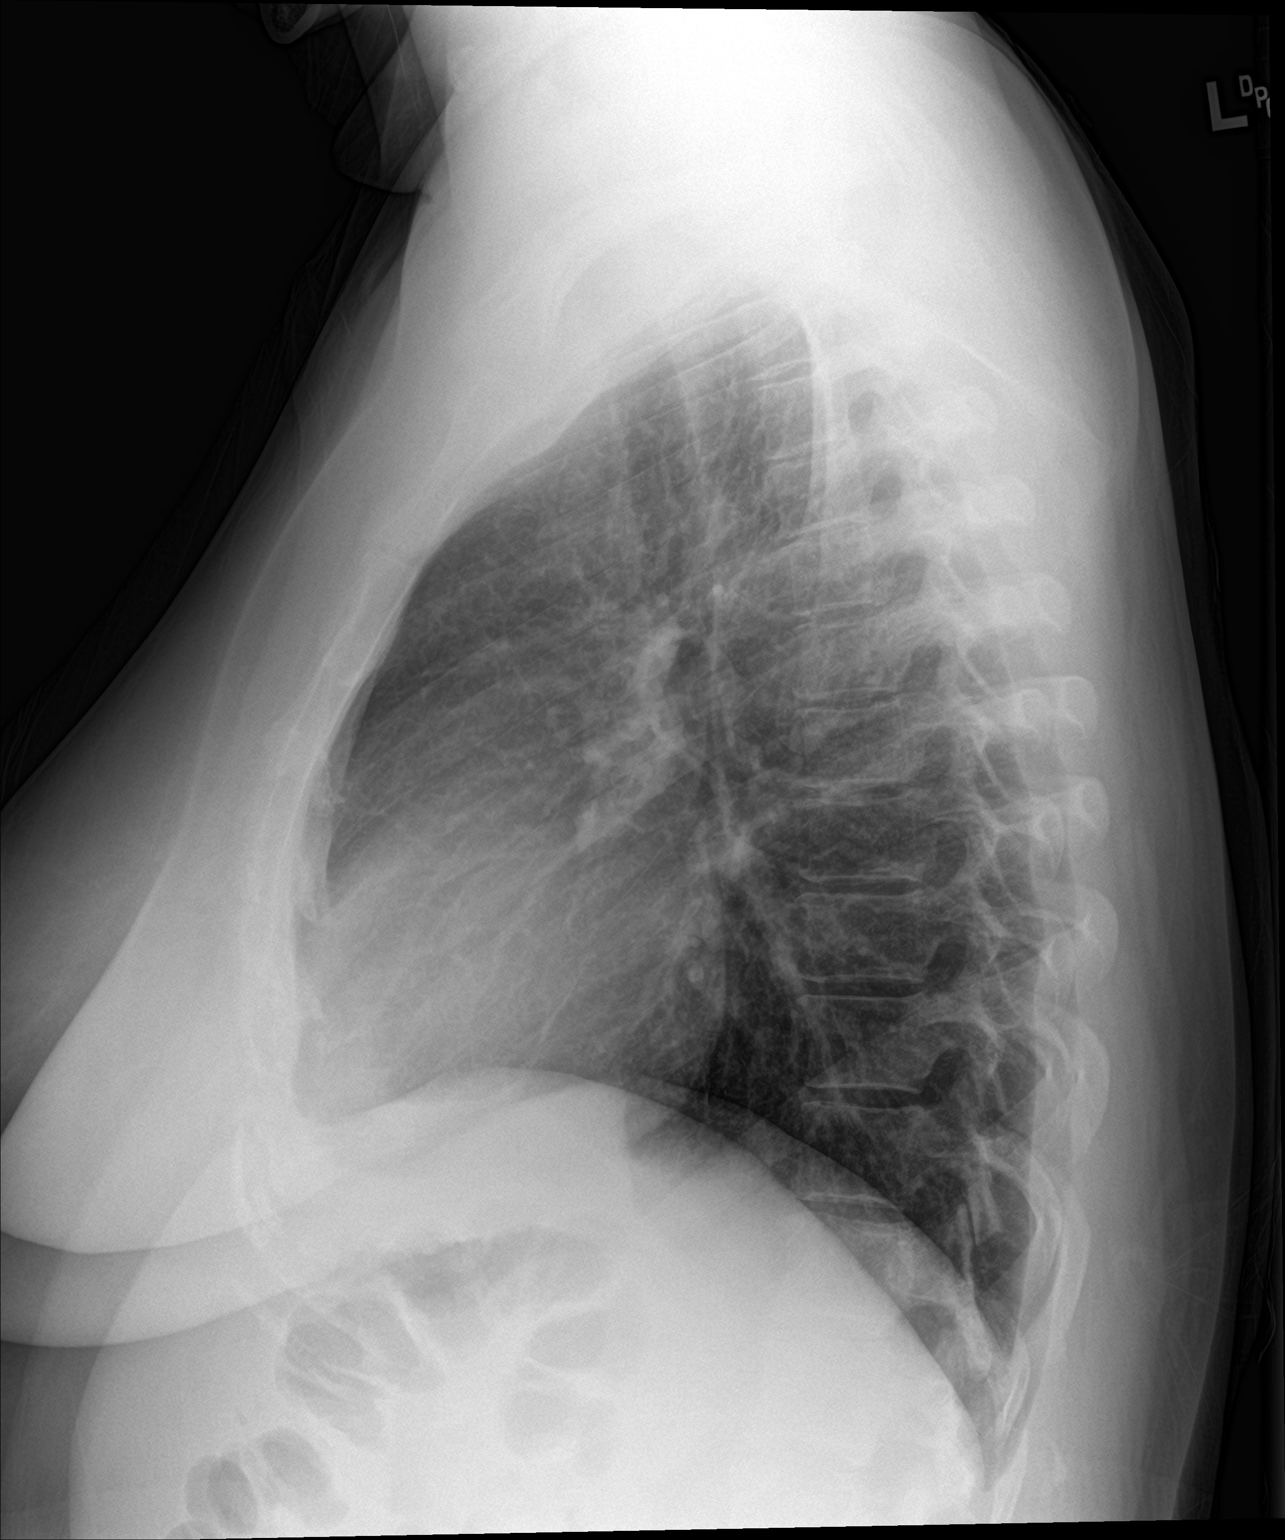

[2 of 2 positions shown; findings below may reference images not displayed]

FINDINGS: The heart size and mediastinal contours are within normal limits.
Both lungs are clear. The visualized skeletal structures are
unremarkable.
IMPRESSION: No active cardiopulmonary disease.

## 2016-12-30 MED ORDER — ONDANSETRON HCL 4 MG PO TABS
4.0000 mg | ORAL_TABLET | Freq: Once | ORAL | Status: AC
  Administered 2016-12-30: 4 mg via ORAL
  Filled 2016-12-30: qty 1

## 2016-12-30 MED ORDER — ACETAMINOPHEN 325 MG PO TABS
650.0000 mg | ORAL_TABLET | Freq: Once | ORAL | Status: AC
  Administered 2016-12-30: 650 mg via ORAL
  Filled 2016-12-30: qty 2

## 2016-12-30 MED ORDER — IBUPROFEN 800 MG PO TABS
800.0000 mg | ORAL_TABLET | Freq: Once | ORAL | Status: AC
  Administered 2016-12-30: 800 mg via ORAL
  Filled 2016-12-30: qty 1

## 2016-12-30 MED ORDER — CYCLOBENZAPRINE HCL 10 MG PO TABS: 10.0000 mg | ORAL_TABLET | Freq: Three times a day (TID) | ORAL | 0 refills | Status: DC

## 2016-12-30 MED ORDER — POTASSIUM CHLORIDE CRYS ER 20 MEQ PO TBCR
40.0000 meq | EXTENDED_RELEASE_TABLET | Freq: Once | ORAL | Status: AC
  Administered 2016-12-30: 40 meq via ORAL
  Filled 2016-12-30: qty 2

## 2016-12-30 MED ORDER — PROMETHAZINE HCL 25 MG/ML IJ SOLN: 12.5000 mg | Freq: Once | INTRAMUSCULAR | Status: DC

## 2016-12-30 NOTE — ED Notes (Signed)
CRITICAL VALUE ALERT  Critical Value:  Hgb 6.8  Date & Time Notied:  12/30/16  Provider Notified: Ivery QualeHobson Bryant, PA  Orders Received/Actions taken: PA will put necessary orders in.

## 2016-12-30 NOTE — ED Provider Notes (Signed)
[facility] EMERGENCY DEPARTMENT Provider Note   CSN: [id number] Arrival date & time: 12/30/16  1630     History   Chief Complaint Chief Complaint  Patient presents with  . Claudication    HPI Erica Banks is a 36 y.o. female.  Patient is a 36 year old female who presents to the emergency department with a complaint of pain in the left calf.  The patient states that over the last 5-7 days she has been having increasing pain in her left calf.  It goes behind her knee and into her thigh.  At times it causes her to have soreness and pain involving her ankle and foot.  The pain is better when she is walking around.  The pain is worse at night.  It wakes her up at times in the middle of the night.  There is she has not had any recent injury or trauma to the area.  Is been no recent operations.  She has a history of anemia and hypokalemia, but no other medical problems related to her lower extremity.  Patient states that she is anemic, she is on iron tablets.  She has not seen her primary physician for several months because her physician died.  She is concerned about her anemia status.  She denies recent nausea.  She denies dizziness.  She has not had any blood in her urine or in her stools.  She does state that she has heavy menstrual cycles, and has had this problem for quite some time. The patient also complains of problem with being short of breath especially when going up just a few steps of her porch.  She states she is a smoker, has not had any recent hospital admissions for lung problems, and is not had any operations or procedures involving her lungs.   The history is provided by the patient.    Past Medical History:  Diagnosis Date  . Panic attacks     Patient Active Problem List   Diagnosis Date Noted  . Anemia 01/01/2015  . Symptomatic anemia 01/01/2015  . Hypokalemia 01/01/2015  . Acute gastroenteritis 01/01/2015  . Abnormal CT of the abdomen 06/08/2013  . GERD  (gastroesophageal reflux disease) 06/08/2013    Past Surgical History:  Procedure Laterality Date  . BIOPSY N/A 06/24/2013   Procedure: BIOPSY TERMINAL ILEUM;  Surgeon: Corbin Adeobert M Rourk, MD;  Location: [facility];  Service: Endoscopy;  Laterality: N/A;  . COLONOSCOPY N/A 06/24/2013   JXB:[id number]:External hemorrhoids/otherwise normal   . nasal bone surgery    . NASAL SINUS SURGERY    . SINUS IRRIGATION    . TONSILLECTOMY    . TUBAL LIGATION      OB History    Gravida Para Term Preterm AB Living   2 2 2     2    SAB TAB Ectopic Multiple Live Births                   Home Medications    Prior to Admission medications   Medication Sig Start Date End Date Taking? Authorizing Provider  acetaminophen (TYLENOL) 500 MG tablet Take 500 mg by mouth every 6 (six) hours as needed for mild pain.    [provider]  ALPRAZolam Prudy Feeler(XANAX) 0.5 MG tablet Take 0.5 mg by mouth 2 (two) times daily as needed for anxiety or sleep.     [provider]  ferrous gluconate (FERGON) 324 MG tablet Take 1 tablet (324 mg total) by mouth 2 (  two) times daily with a meal. 01/02/15   Erick Blinks, MD  ondansetron (ZOFRAN) 8 MG tablet Take 1 tablet (8 mg total) by mouth every 4 (four) hours as needed. 01/01/15   Donnetta Hutching, MD  ranitidine (ZANTAC) 150 MG capsule Take 150 mg by mouth as needed for heartburn.    [provider]    Family History Family History  Problem Relation Age of Onset  . Diabetes Mother   . Hypertension Mother   . COPD Mother   . Diabetes Sister   . Hypertension Sister   . Cancer Other   . Crohn's disease Maternal Grandmother   . Colon cancer Neg Hx     Social History Social History   Tobacco Use  . Smoking status: Current Every Day Smoker    Packs/day: 1.00    Years: 12.00    Pack years: 12.00    Types: Cigarettes  . Smokeless tobacco: Never Used  Substance Use Topics  . Alcohol use: Yes    Alcohol/week: 0.0 oz    Types: 4 - 5 Cans of beer per week     Comment: occ  . Drug use: No     Allergies   Demerol   Review of Systems Review of Systems  Constitutional: Negative for activity change.       All ROS Neg except as noted in HPI  HENT: Negative for nosebleeds.   Eyes: Negative for photophobia and discharge.  Respiratory: Negative for cough, shortness of breath and wheezing.   Cardiovascular: Negative for chest pain and palpitations.  Gastrointestinal: Negative for abdominal pain and blood in stool.  Genitourinary: Positive for menstrual problem. Negative for dysuria, frequency and hematuria.  Musculoskeletal: Positive for myalgias. Negative for arthralgias, back pain and neck pain.  Skin: Negative.   Neurological: Negative for dizziness, seizures and speech difficulty.  Psychiatric/Behavioral: Negative for confusion and hallucinations.     Physical Exam Updated Vital Signs BP (!) 148/77 (BP Location: Left Arm)   Pulse 84   Temp 98.6 F (37 C) (Oral)   Resp 18   Ht 5\' 8"  (1.727 m)   Wt 86.2 kg (190 lb)   LMP 10/24/2016   SpO2 100%   BMI 28.89 kg/m   Physical Exam  Constitutional: She is oriented to person, place, and time. She appears well-developed and well-nourished.  Non-toxic appearance.  HENT:  Head: Normocephalic.  Right Ear: Tympanic membrane and external ear normal.  Left Ear: Tympanic membrane and external ear normal.  Eyes: EOM and lids are normal. Pupils are equal, round, and reactive to light.  Neck: Normal range of motion. Neck supple. Carotid bruit is not present.  Cardiovascular: Normal rate, regular rhythm, normal heart sounds, intact distal pulses and normal pulses.  Pulmonary/Chest: Breath sounds normal. No respiratory distress.  Abdominal: Soft. Bowel sounds are normal. There is no tenderness. There is no guarding.  Musculoskeletal: Normal range of motion.  There is pain involving the left calf.  There is pain of the posterior left thigh.  The dorsalis pedis pulses 2+.  The capillary refill is  less than 2 seconds.  No pain involving the right lower extremity.  There are no temperature changes, with the right compared to the left.  Lymphadenopathy:       Head (right side): No submandibular adenopathy present.       Head (left side): No submandibular adenopathy present.    She has no cervical adenopathy.  Neurological: She is alert and oriented to person, place, and  time. She has normal strength. No cranial nerve deficit or sensory deficit.  Skin: Skin is warm and dry.  Psychiatric: She has a normal mood and affect. Her speech is normal.  Nursing note and vitals reviewed.    ED Treatments / Results  Labs (all labs ordered are listed, but only abnormal results are displayed) Labs Reviewed  CBC WITH DIFFERENTIAL/PLATELET - Abnormal; Notable for the following components:      Result Value   Hemoglobin 6.8 (*)    HCT 26.2 (*)    MCV 67.5 (*)    MCH 17.5 (*)    MCHC 26.0 (*)    RDW 18.3 (*)    All other components within normal limits  URINALYSIS, ROUTINE W REFLEX MICROSCOPIC - Abnormal; Notable for the following components:   Color, Urine STRAW (*)    APPearance HAZY (*)    Hgb urine dipstick SMALL (*)    Leukocytes, UA SMALL (*)    Bacteria, UA RARE (*)    Squamous Epithelial / LPF 0-5 (*)    All other components within normal limits  COMPREHENSIVE METABOLIC PANEL  POC URINE PREG, ED    EKG  EKG Interpretation None       Radiology Dg Chest 2 View  Result Date: 12/30/2016 CLINICAL DATA:  Worsening dyspnea with cough and clear phlegm. EXAM: CHEST  2 VIEW COMPARISON:  01/01/2015 FINDINGS: The heart size and mediastinal contours are within normal limits. Both lungs are clear. The visualized skeletal structures are unremarkable. IMPRESSION: No active cardiopulmonary disease. Electronically Signed   By: Tollie Eth M.D.   On: 12/30/2016 19:43   US Venous Img Lower Unilateral Left  Result Date: 12/30/2016 CLINICAL DATA:  36 year old female with left leg pain and  edema for several days. No known INJURY. EXAM: LEFT LOWER EXTREMITY VENOUS DOPPLER ULTRASOUND TECHNIQUE: Gray-scale sonography with graded compression, as well as color Doppler and duplex ultrasound were performed to evaluate the lower extremity deep venous systems from the level of the common femoral vein and including the common femoral, femoral, profunda femoral, popliteal and calf veins including the posterior tibial, peroneal and gastrocnemius veins when visible. The superficial great saphenous vein was also interrogated. Spectral Doppler was utilized to evaluate flow at rest and with distal augmentation maneuvers in the common femoral, femoral and popliteal veins. COMPARISON:  CT Abdomen and Pelvis 04/10/2013 FINDINGS: Contralateral Common Femoral Vein: Respiratory phasicity is normal and symmetric with the symptomatic side. No evidence of thrombus. Normal compressibility. Common Femoral Vein: No evidence of thrombus. Normal compressibility, respiratory phasicity and response to augmentation. Saphenofemoral Junction: No evidence of thrombus. Normal compressibility and flow on color Doppler imaging. Profunda Femoral Vein: No evidence of thrombus. Normal compressibility and flow on color Doppler imaging. Femoral Vein: No evidence of thrombus. Normal compressibility, respiratory phasicity and response to augmentation. Popliteal Vein: No evidence of thrombus. Normal compressibility, respiratory phasicity and response to augmentation. Calf Veins: No evidence of thrombus. Normal compressibility and flow on color Doppler imaging. Superficial Great Saphenous Vein: No evidence of thrombus. Normal compressibility. Venous Reflux:  None. Other Findings:  None. IMPRESSION: No evidence of left lower extremity deep venous thrombosis. Electronically Signed   By: Odessa Fleming M.D.   On: 12/30/2016 17:39    Procedures Procedures (including critical care time)  Medications Ordered in ED Medications - No data to  display   Initial Impression / Assessment and Plan / ED Course  I have reviewed the triage vital signs and the nursing notes.  Pertinent  labs & imaging results that were available during my care of the patient were reviewed by me and considered in my medical decision making (see chart for details).       Final Clinical Impressions(s) / ED Diagnoses MDM Vital signs within normal limits.  Ultrasound of the lower extremity is negative for DVT.  Patient is ambulatory in the room.  She states the pain feels better when she is walking.  It is worse at night when she goes to bed.  Urine pregnancy test is negative.  Complete blood count shows the hemoglobin to be 6.8, which is low.  The hematocrit is also low at 26.2.  The MCV is low at 67.5 consistent with a microcytic anemia.  Conference of metabolic panel shows the potassium to be low at 2.9.  Hepatic function studies are all normal.  Urine analysis shows a small leukocyte esterase with 6-30 WBCs and rare bacteria.  A culture will be sent to the lab.  Patient states that she has shortness of breath.  A chest x-ray was obtained but is found to be negative.    Final diagnoses:  Pain    ED Discharge Orders    None       Ivery Quale, PA-C 12/30/16 2110    Ivery Quale, PA-C 12/30/16 2200    Doug Sou, MD 12/31/16 [phone]

## 2016-12-30 NOTE — ED Triage Notes (Signed)
Pt reports left calf pain that has extended to left thigh/posterior knee over last several days. Pt reports increased pain with ambulation and swelling of LLE. Pt denies any known injury.

## 2016-12-30 NOTE — Discharge Instructions (Signed)
Your ultrasound and Doppler were negative for deep vein thrombosis or blood clots in the leg.  Your potassium is low.  Please increase foods rich in potassium.  I have included some of these in your discharge instructions.  Your hemoglobin and hematocrit are also low.  In review of your records show that you were anemic the last time you had to be admitted to this facility.  Please consider using time release iron since you have complications with regular iron.  Please use foods high and rich in iron.  Please see the [facility] for assistance with obtaining a primary physician to help monitor your multiple medical issues. [facility]  [address] [place], [place] [address] [phone]  Services [facility] offers a variety of basic health services.  Services include but are not limited to: Blood pressure checks  Heart rate checks  Blood sugar checks  Urine analysis  Rapid strep tests  Pregnancy tests.  Health education and referrals  People needing more complex services will be directed to a physician online. Using these virtual visits, doctors can evaluate and prescribe medicine and treatments. There will be no medication on-site, though [facility] will help patients fill their prescriptions at little to no cost.   For More information please go to: [email or web address]

## 2016-12-30 NOTE — ED Provider Notes (Signed)
Complains right calf pain for the past 2 months.  Denies injury.  Pain is improved with walking worse with lying supine no other complaint.  No lightheadedness no shortness of breath.  Patient noted to be anemic.  She is instructed to take 2 iron tablets daily.  Takes iron every other day due to nausea .  History of heavy menses.  No chest pain no shortness of breath no lightheadedness.  Patient noted to be anemic.  Does not require transfusion.  Has long-standing microcytic anemia.  On exam alert no distress lungs clear to auscultation heart regular rate and rhythm left lower extremity no swelling no redness she is tender at the calf.  DP and PT pulses 2+.  Good capillary refill left leg pain is chronic.  No evidence of DVT.  No evidence of claudication   Doug SouJacubowitz, Mycah Mcdougall, MD 12/30/16 2111

## 2017-01-28 DIAGNOSIS — I2699 Other pulmonary embolism without acute cor pulmonale: Secondary | ICD-10-CM

## 2017-01-28 DIAGNOSIS — I82409 Acute embolism and thrombosis of unspecified deep veins of unspecified lower extremity: Secondary | ICD-10-CM

## 2017-01-28 HISTORY — DX: Other pulmonary embolism without acute cor pulmonale: I26.99

## 2017-01-28 HISTORY — DX: Acute embolism and thrombosis of unspecified deep veins of unspecified lower extremity: I82.409

## 2017-10-23 DIAGNOSIS — Z86711 Personal history of pulmonary embolism: Secondary | ICD-10-CM | POA: Insufficient documentation

## 2017-10-23 DIAGNOSIS — N920 Excessive and frequent menstruation with regular cycle: Secondary | ICD-10-CM | POA: Insufficient documentation

## 2018-02-03 DIAGNOSIS — N12 Tubulo-interstitial nephritis, not specified as acute or chronic: Secondary | ICD-10-CM | POA: Insufficient documentation

## 2018-03-04 DIAGNOSIS — K642 Third degree hemorrhoids: Secondary | ICD-10-CM | POA: Insufficient documentation

## 2018-08-17 DIAGNOSIS — K625 Hemorrhage of anus and rectum: Secondary | ICD-10-CM | POA: Insufficient documentation

## 2018-11-26 ENCOUNTER — Other Ambulatory Visit: Payer: Self-pay | Admitting: *Deleted

## 2018-11-26 DIAGNOSIS — Z20822 Contact with and (suspected) exposure to covid-19: Secondary | ICD-10-CM

## 2018-11-27 ENCOUNTER — Telehealth: Payer: Self-pay

## 2018-11-27 NOTE — Telephone Encounter (Signed)
Patient called and she was told her COVID-19 test had not resulted.  She will call later.

## 2018-11-28 LAB — NOVEL CORONAVIRUS, NAA: SARS-CoV-2, NAA: DETECTED — AB

## 2018-12-28 DIAGNOSIS — Z7901 Long term (current) use of anticoagulants: Secondary | ICD-10-CM | POA: Insufficient documentation

## 2019-07-19 ENCOUNTER — Encounter (HOSPITAL_COMMUNITY): Payer: Self-pay | Admitting: Emergency Medicine

## 2019-07-19 ENCOUNTER — Other Ambulatory Visit: Payer: Self-pay

## 2019-07-19 DIAGNOSIS — R112 Nausea with vomiting, unspecified: Secondary | ICD-10-CM | POA: Insufficient documentation

## 2019-07-19 DIAGNOSIS — R42 Dizziness and giddiness: Secondary | ICD-10-CM | POA: Insufficient documentation

## 2019-07-19 DIAGNOSIS — E872 Acidosis: Secondary | ICD-10-CM | POA: Insufficient documentation

## 2019-07-19 DIAGNOSIS — N390 Urinary tract infection, site not specified: Secondary | ICD-10-CM | POA: Diagnosis not present

## 2019-07-19 DIAGNOSIS — F17210 Nicotine dependence, cigarettes, uncomplicated: Secondary | ICD-10-CM | POA: Diagnosis not present

## 2019-07-19 DIAGNOSIS — E860 Dehydration: Secondary | ICD-10-CM | POA: Diagnosis not present

## 2019-07-19 DIAGNOSIS — E876 Hypokalemia: Secondary | ICD-10-CM | POA: Diagnosis not present

## 2019-07-19 DIAGNOSIS — K8020 Calculus of gallbladder without cholecystitis without obstruction: Secondary | ICD-10-CM | POA: Diagnosis not present

## 2019-07-19 DIAGNOSIS — Z20822 Contact with and (suspected) exposure to covid-19: Secondary | ICD-10-CM | POA: Diagnosis not present

## 2019-07-19 DIAGNOSIS — R1011 Right upper quadrant pain: Secondary | ICD-10-CM | POA: Diagnosis present

## 2019-07-19 LAB — BASIC METABOLIC PANEL
Anion gap: 17 — ABNORMAL HIGH (ref 5–15)
BUN: <5 mg/dL — ABNORMAL LOW (ref 6–20)
CO2: 19 mmol/L — ABNORMAL LOW (ref 22–32)
Calcium: 9.3 mg/dL (ref 8.9–10.3)
Chloride: 97 mmol/L — ABNORMAL LOW (ref 98–111)
Creatinine, Ser: 0.62 mg/dL (ref 0.44–1.00)
GFR calc Af Amer: >60 mL/min (ref >60)
GFR calc non Af Amer: >60 mL/min (ref >60)
Glucose, Bld: 122 mg/dL — ABNORMAL HIGH (ref 70–99)
Potassium: 2.9 mmol/L — ABNORMAL LOW (ref 3.5–5.1)
Sodium: 133 mmol/L — ABNORMAL LOW (ref 135–145)

## 2019-07-19 LAB — CBC
HCT: 42.7 % (ref 36.0–46.0)
Hemoglobin: 14.4 g/dL (ref 12.0–15.0)
MCH: 28.3 pg (ref 26.0–34.0)
MCHC: 33.7 g/dL (ref 30.0–36.0)
MCV: 84.1 fL (ref 80.0–100.0)
Platelets: 522 10*3/uL — ABNORMAL HIGH (ref 150–400)
RBC: 5.08 MIL/uL (ref 3.87–5.11)
RDW: 16 % — ABNORMAL HIGH (ref 11.5–15.5)
WBC: 8.3 10*3/uL (ref 4.0–10.5)
nRBC: 0 % (ref 0.0–0.2)

## 2019-07-19 LAB — LIPASE, BLOOD: Lipase: 24 U/L (ref 11–51)

## 2019-07-19 NOTE — ED Triage Notes (Signed)
Patient states nausea and vomiting x 2 weeks. Patient was seen at [facility] Thursday  for the same issue and treated for low potassium along with nausea and vomiting.

## 2019-07-19 NOTE — ED Notes (Signed)
Patient also treated for a UTI at [facility].

## 2019-07-20 ENCOUNTER — Telehealth: Payer: Self-pay | Admitting: Gastroenterology

## 2019-07-20 ENCOUNTER — Observation Stay (HOSPITAL_COMMUNITY): Payer: Medicaid Other

## 2019-07-20 ENCOUNTER — Observation Stay (HOSPITAL_COMMUNITY)
Admission: EM | Admit: 2019-07-20 | Discharge: 2019-07-20 | Disposition: A | Discharge status: Home / Self Care | Payer: Medicaid Other | Attending: Family Medicine | Admitting: Family Medicine

## 2019-07-20 ENCOUNTER — Encounter (HOSPITAL_COMMUNITY): Payer: Self-pay | Admitting: Emergency Medicine

## 2019-07-20 DIAGNOSIS — E872 Acidosis, unspecified: Secondary | ICD-10-CM

## 2019-07-20 DIAGNOSIS — N390 Urinary tract infection, site not specified: Secondary | ICD-10-CM

## 2019-07-20 DIAGNOSIS — K8000 Calculus of gallbladder with acute cholecystitis without obstruction: Secondary | ICD-10-CM | POA: Insufficient documentation

## 2019-07-20 DIAGNOSIS — R1011 Right upper quadrant pain: Secondary | ICD-10-CM

## 2019-07-20 DIAGNOSIS — R112 Nausea with vomiting, unspecified: Secondary | ICD-10-CM | POA: Diagnosis not present

## 2019-07-20 DIAGNOSIS — K8020 Calculus of gallbladder without cholecystitis without obstruction: Secondary | ICD-10-CM | POA: Diagnosis present

## 2019-07-20 DIAGNOSIS — E876 Hypokalemia: Secondary | ICD-10-CM | POA: Diagnosis present

## 2019-07-20 DIAGNOSIS — K219 Gastro-esophageal reflux disease without esophagitis: Secondary | ICD-10-CM | POA: Diagnosis present

## 2019-07-20 DIAGNOSIS — E860 Dehydration: Secondary | ICD-10-CM

## 2019-07-20 DIAGNOSIS — R9431 Abnormal electrocardiogram [ECG] [EKG]: Secondary | ICD-10-CM

## 2019-07-20 LAB — URINALYSIS, ROUTINE W REFLEX MICROSCOPIC
Bacteria, UA: NONE SEEN
Glucose, UA: NEGATIVE mg/dL
Hgb urine dipstick: NEGATIVE
Ketones, ur: 5 mg/dL — AB
Leukocytes,Ua: NEGATIVE
Nitrite: NEGATIVE
Protein, ur: 100 mg/dL — AB
Specific Gravity, Urine: 1.026 (ref 1.005–1.030)
Squamous Epithelial / LPF: >50 — ABNORMAL HIGH (ref 0–5)
pH: 5 (ref 5.0–8.0)

## 2019-07-20 LAB — HEPATIC FUNCTION PANEL
ALT: 102 U/L — ABNORMAL HIGH (ref 0–44)
AST: 172 U/L — ABNORMAL HIGH (ref 15–41)
Albumin: 3.4 g/dL — ABNORMAL LOW (ref 3.5–5.0)
Alkaline Phosphatase: 74 U/L (ref 38–126)
Bilirubin, Direct: 0.5 mg/dL — ABNORMAL HIGH (ref 0.0–0.2)
Indirect Bilirubin: 0.9 mg/dL (ref 0.3–0.9)
Total Bilirubin: 1.4 mg/dL — ABNORMAL HIGH (ref 0.3–1.2)
Total Protein: 6.8 g/dL (ref 6.5–8.1)

## 2019-07-20 LAB — MAGNESIUM: Magnesium: 1.5 mg/dL — ABNORMAL LOW (ref 1.7–2.4)

## 2019-07-20 LAB — SARS CORONAVIRUS 2 BY RT PCR (HOSPITAL ORDER, PERFORMED IN ~~LOC~~ HOSPITAL LAB): SARS Coronavirus 2: NEGATIVE

## 2019-07-20 LAB — LACTIC ACID, PLASMA
Lactic Acid, Venous: 2.2 mmol/L (ref 0.5–1.9)
Lactic Acid, Venous: 1.7 mmol/L (ref 0.5–1.9)

## 2019-07-20 IMAGING — US US ABDOMEN LIMITED
1 series · 14 of 25 positions shown · non-contrast
Comparison: None.

CLINICAL DATA: Right upper quadrant pain, nausea, vomiting for 2
weeks

EXAM:
ULTRASOUND ABDOMEN LIMITED RIGHT UPPER QUADRANT

[Series 1: us abdomen limited ruq · 14 of 64 slices shown]
[im 1/64]
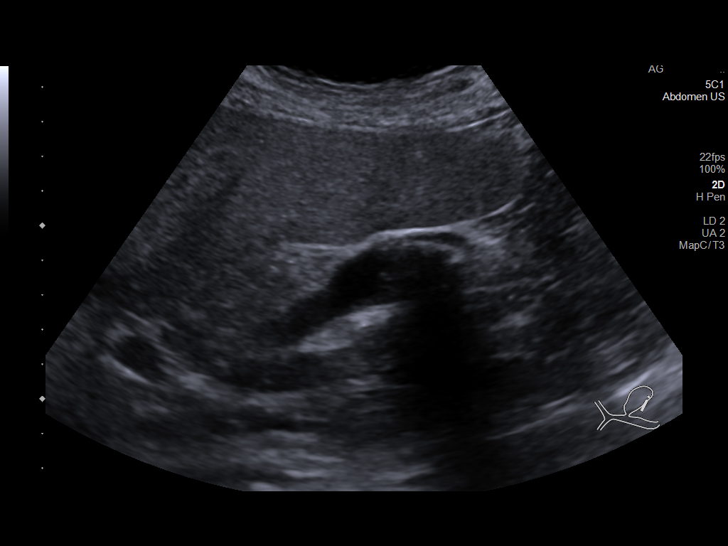
[im 6/64]
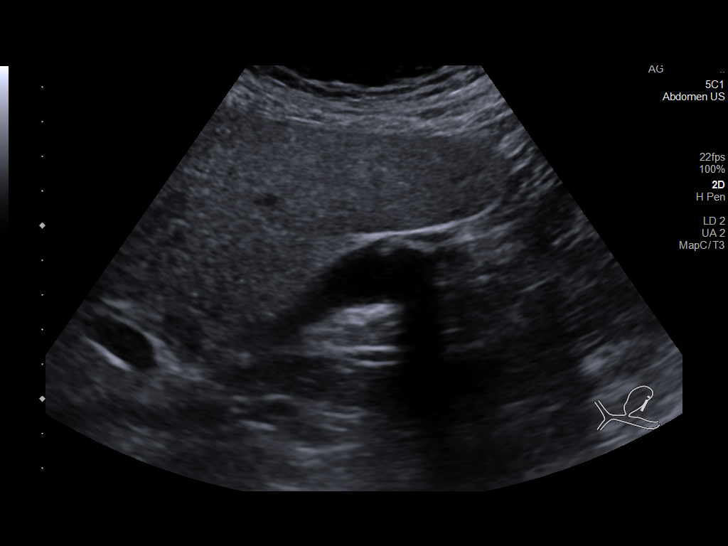
[im 11/64]
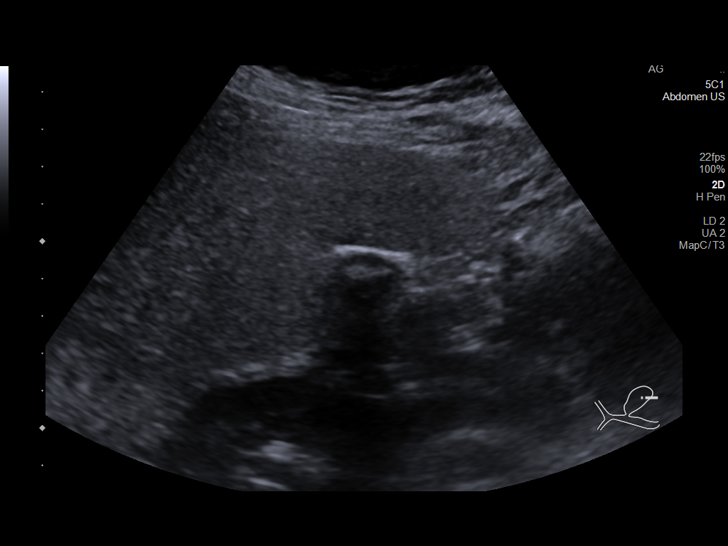
[im 16/64]
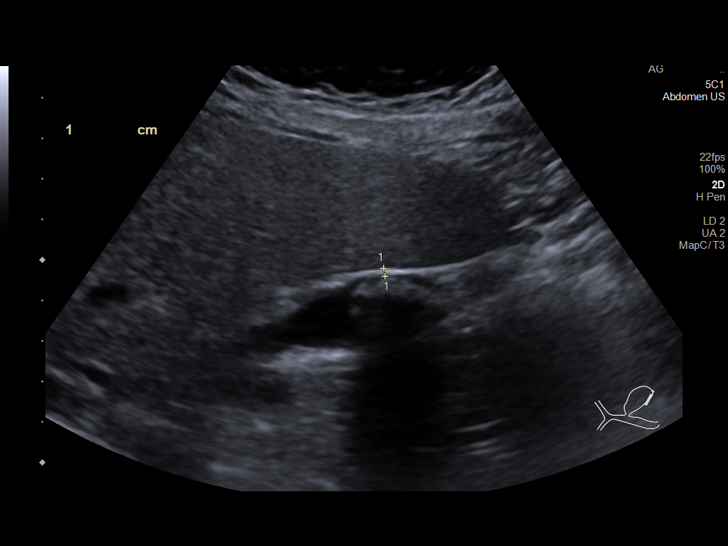
[im 22/64]
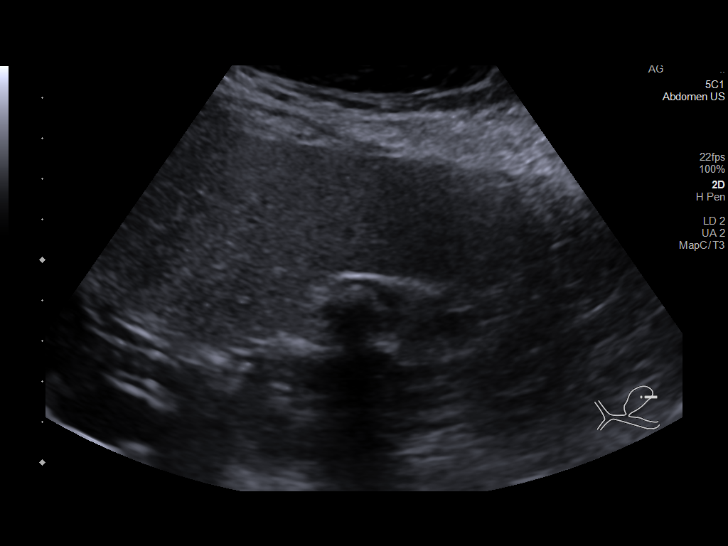
[im 24/64]
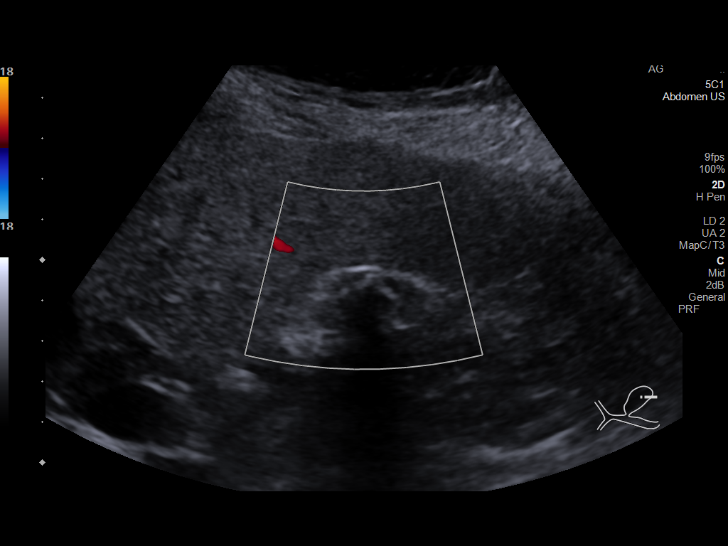
[im 29/64]
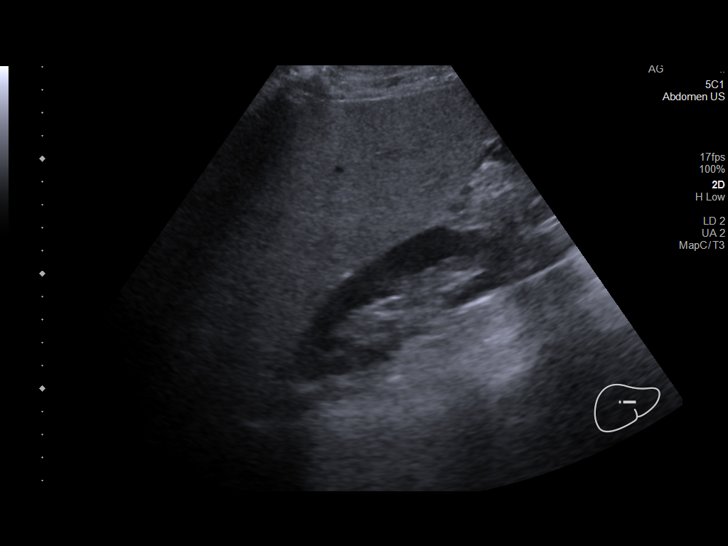
[im 35/64]
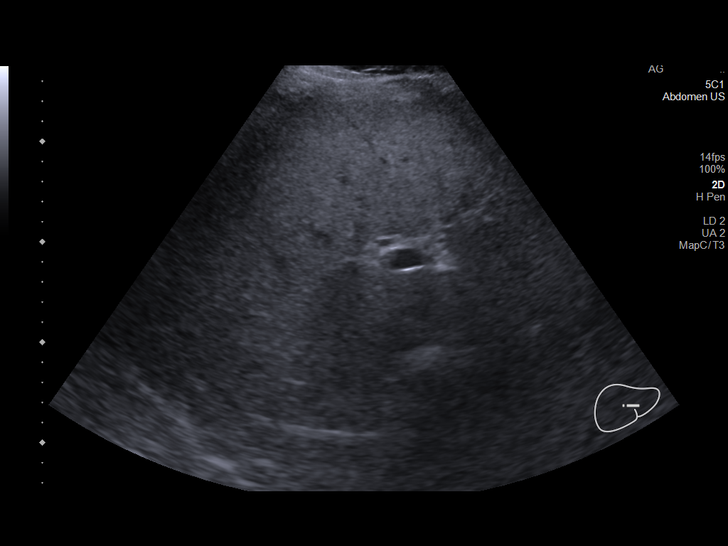
[im 40/64]
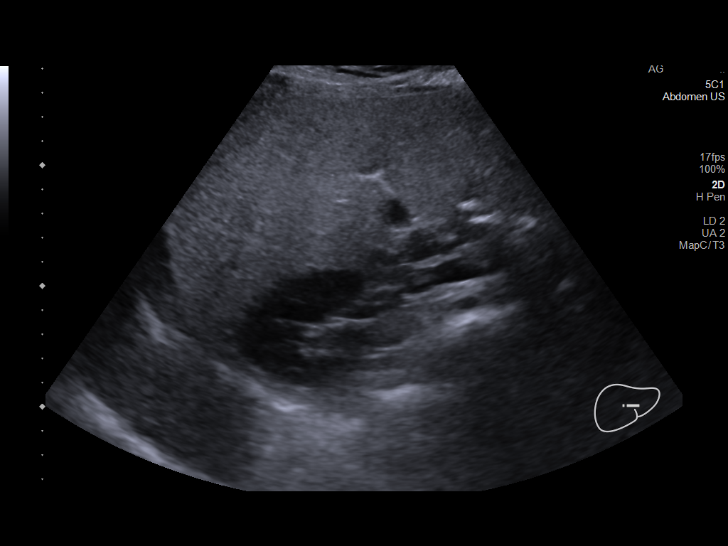
[im 43/64]
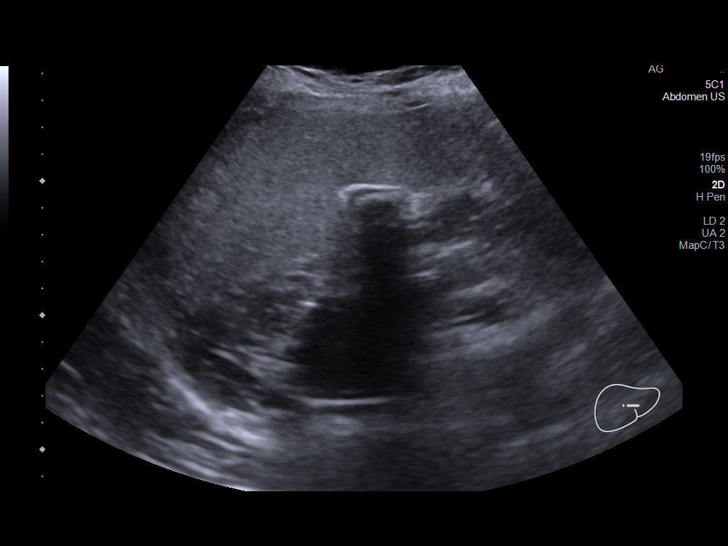
[im 48/64]
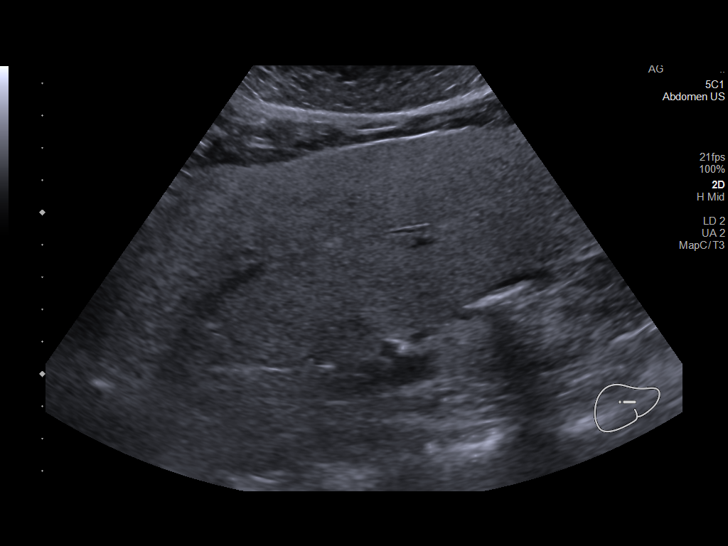
[im 53/64]
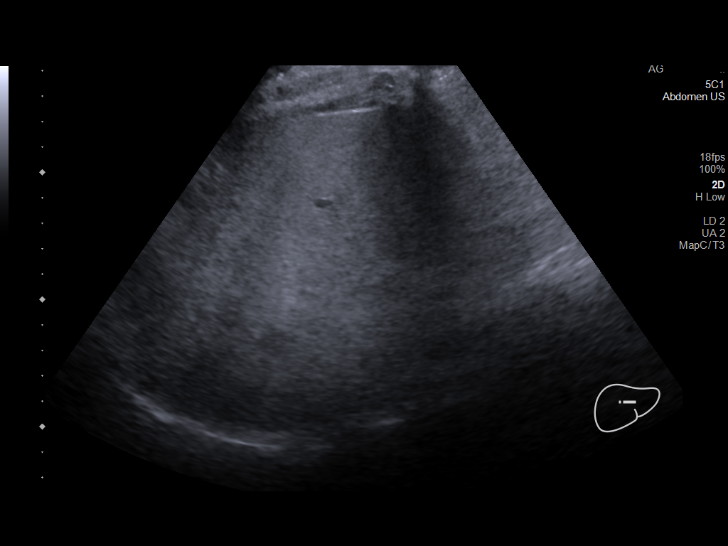
[im 58/64]
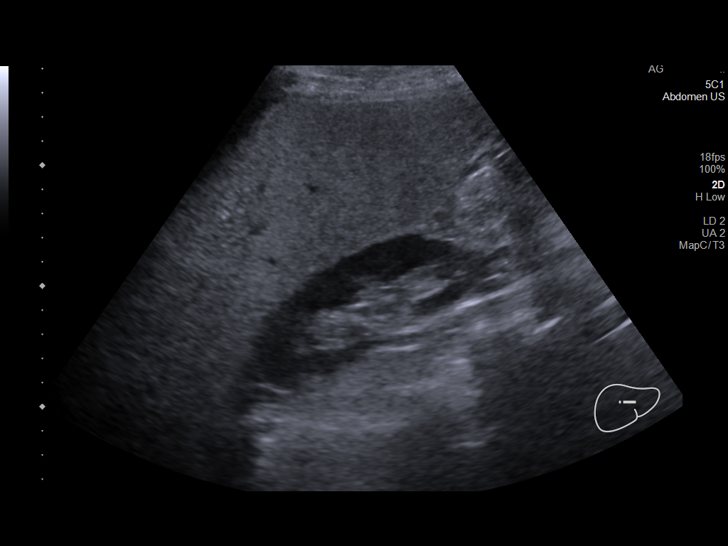
[im 64/64]
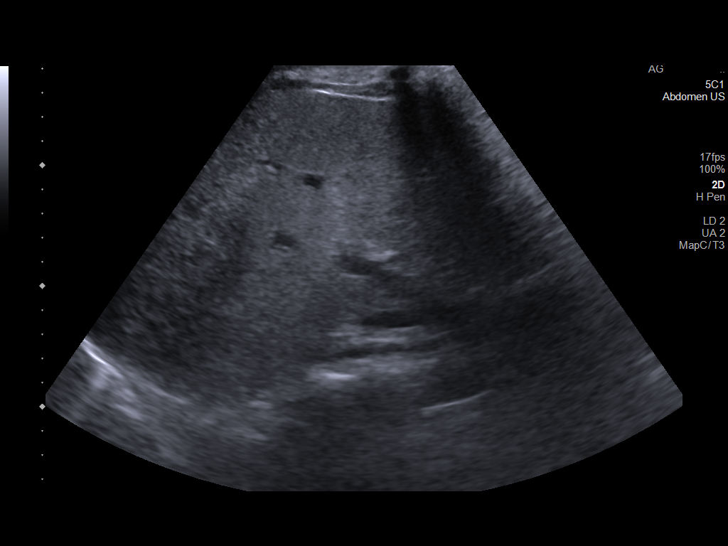

[14 of 25 positions shown; findings below may reference images not displayed]

FINDINGS: Gallbladder:

Cholelithiasis without gallbladder wall thickening or
pericholecystic fluid. Negative sonographic Murphy sign.

Common bile duct:

Diameter: 2.1 mm

Liver:

No focal lesion identified. Increased hepatic parenchymal
echogenicity. Portal vein is patent on color Doppler imaging with
normal direction of blood flow towards the liver.

Other: None.
IMPRESSION: 1. Cholelithiasis without sonographic evidence of acute
cholecystitis.
2. Increased hepatic echogenicity as can be seen with hepatic
steatosis.

## 2019-07-20 IMAGING — CT CT ABD-PELV W/ CM
2 of 4 series · 16 of 46 positions shown, 18 images · IV contrast (omnipaque)
Comparison: [DATE]

CLINICAL DATA: Fatigue and abdominal pain.

EXAM:
CT ABDOMEN AND PELVIS WITH CONTRAST
TECHNIQUE: Multidetector CT imaging of the abdomen and pelvis was performed
using the standard protocol following bolus administration of
intravenous contrast.
CONTRAST:  100mL OMNIPAQUE IOHEXOL 300 MG/ML  SOLN

[Series 2: axial st · axial · 0.71mm/px · z∈[-446,+19]mm · 13 of 103 slices shown, 15 images]
[im 5/103  soft-tissue]
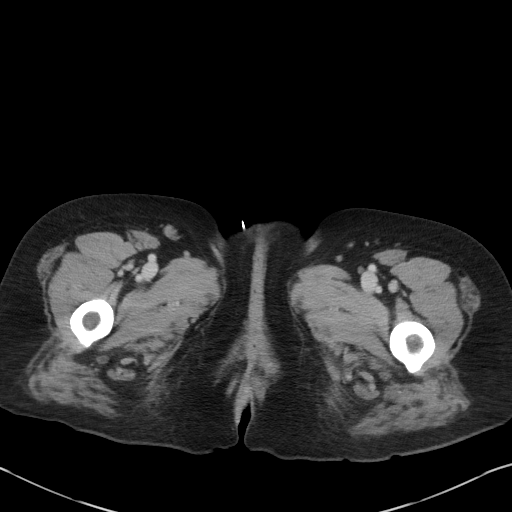
[im 5/103  bone]
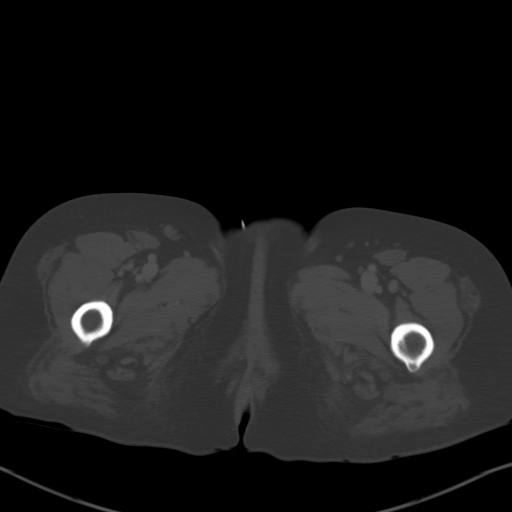
[im 13/103  soft-tissue]
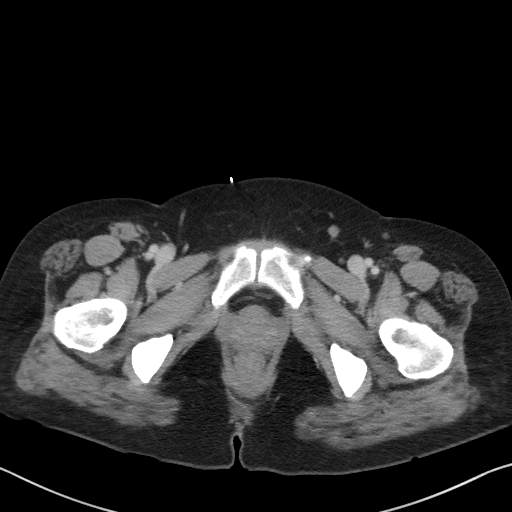
[im 21/103  soft-tissue]
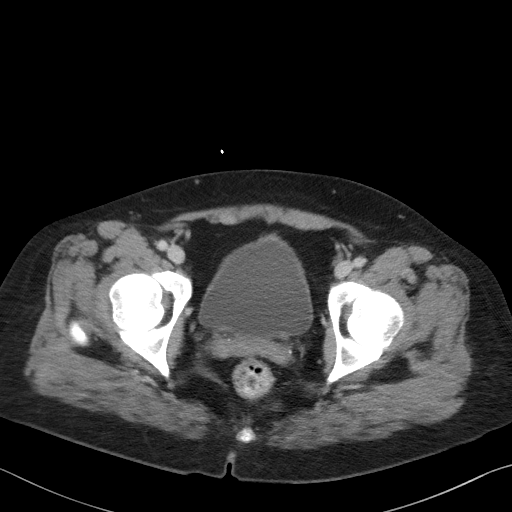
[im 29/103  soft-tissue]
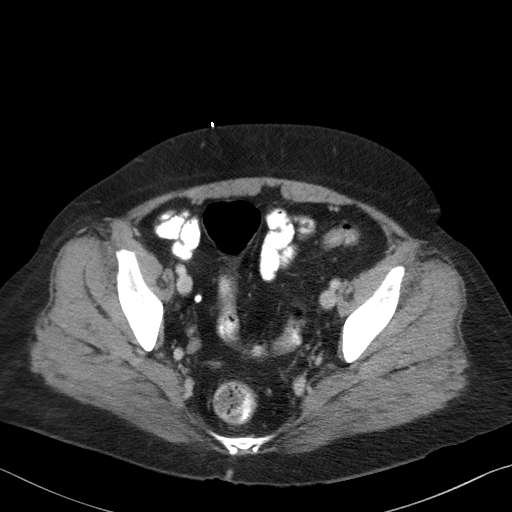
[im 37/103  soft-tissue]
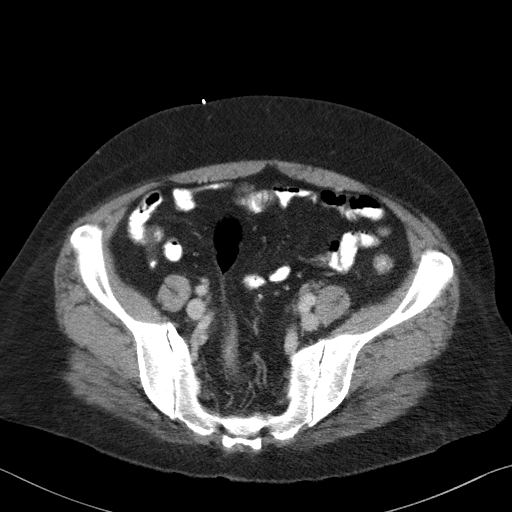
[im 45/103  soft-tissue]
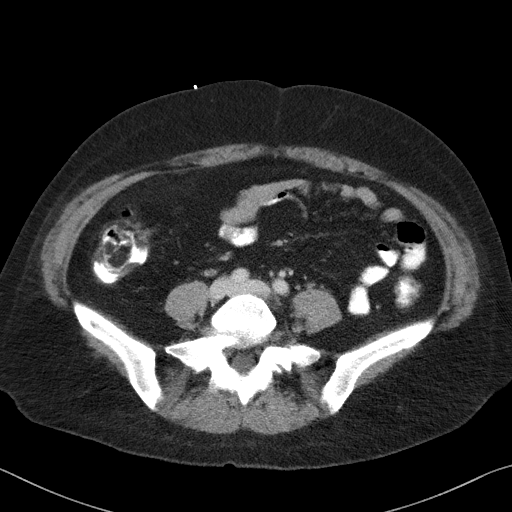
[im 54/103  soft-tissue]
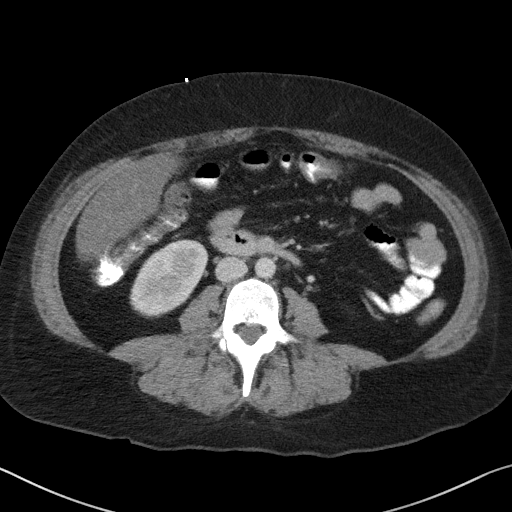
[im 58/103  soft-tissue]
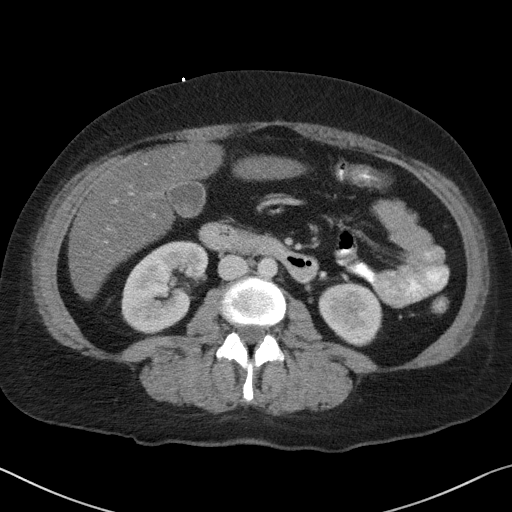
[im 66/103  soft-tissue]
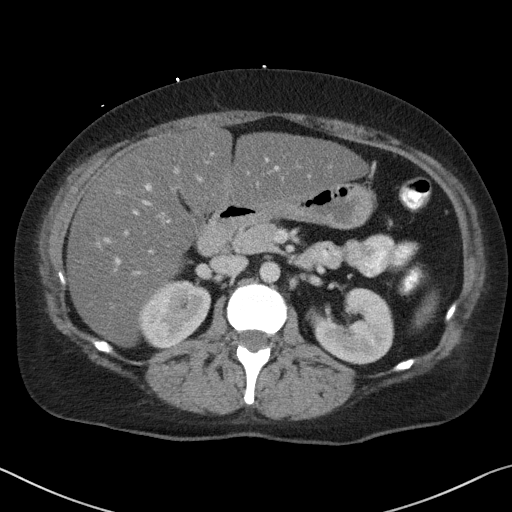
[im 66/103  bone]
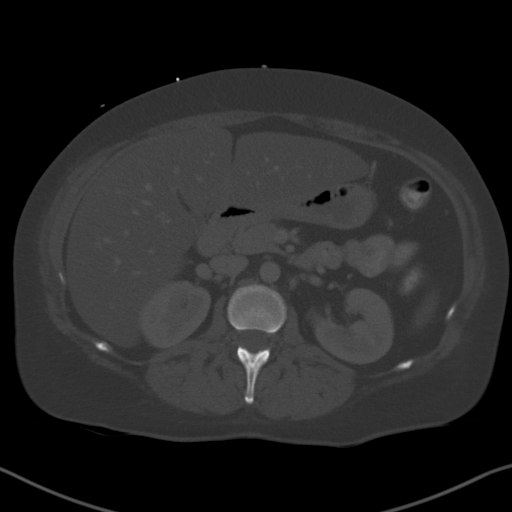
[im 74/103  soft-tissue]
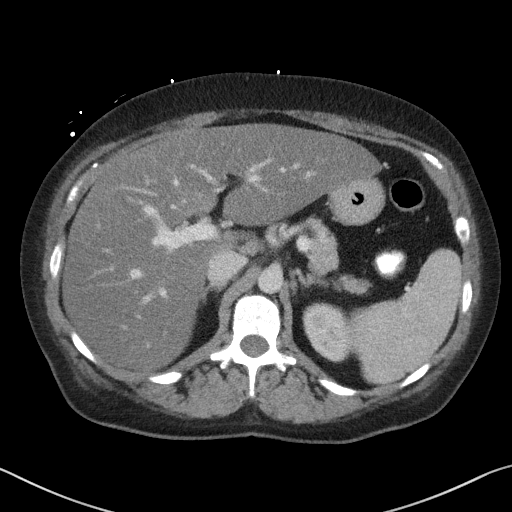
[im 82/103  soft-tissue]
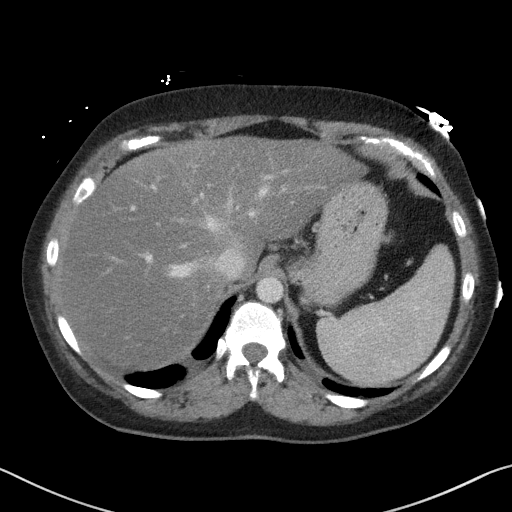
[im 90/103  soft-tissue]
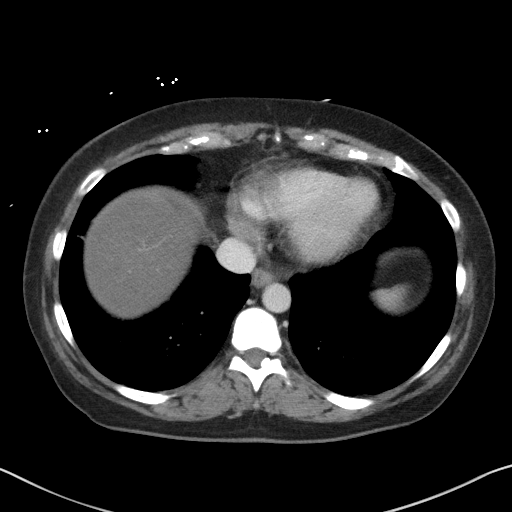
[im 98/103  soft-tissue]
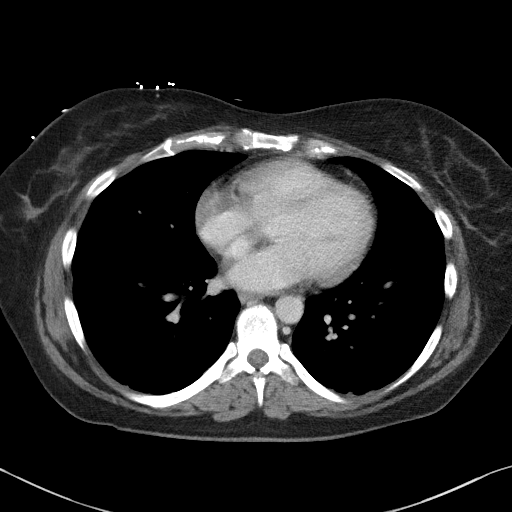

[Series 5: coronal st · coronal · 0.73mm/px · 3 of 106 slices shown]
[im 36/106  soft-tissue]
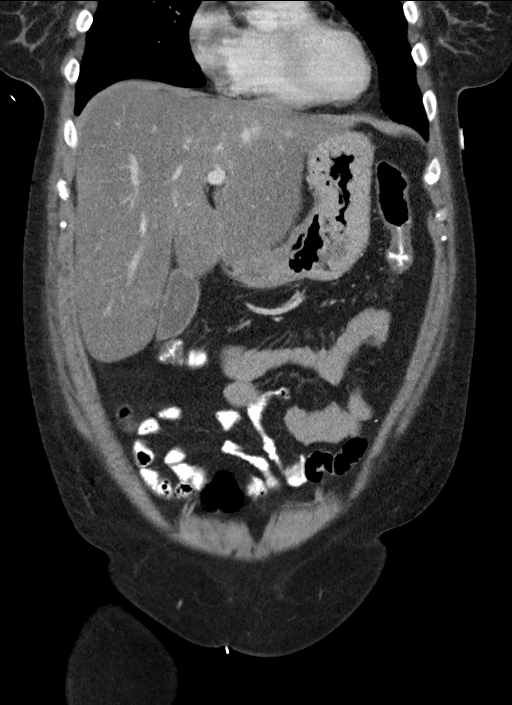
[im 47/106  soft-tissue]
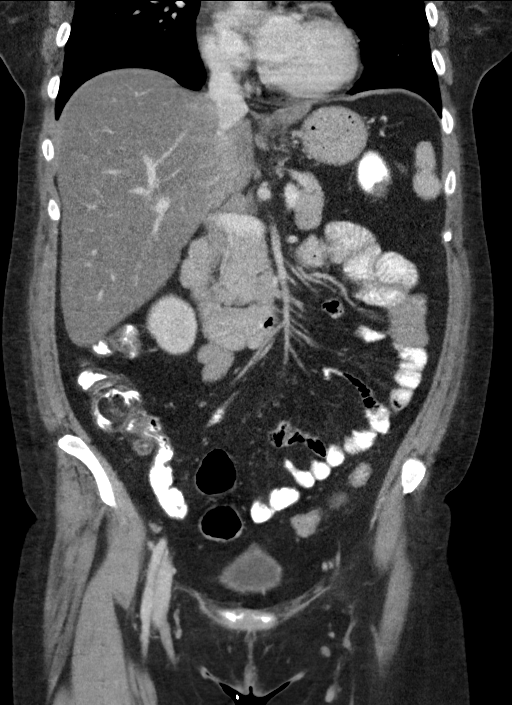
[im 59/106  soft-tissue]
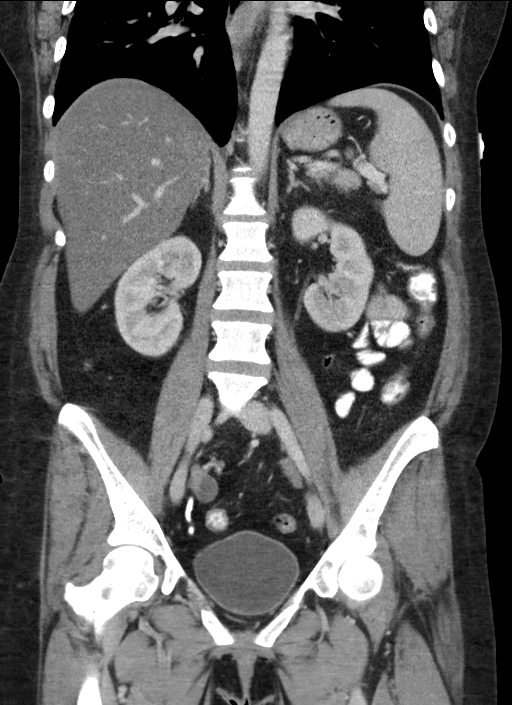

[16 of 46 positions shown; findings below may reference images not displayed]

FINDINGS: Lower chest: Very mild atelectasis is seen within the right lung
base.

Hepatobiliary: There is diffuse fatty infiltration of the liver
parenchyma. No focal liver abnormality is seen. Tiny ill-defined
gallstones are suspected without evidence of gallbladder wall
thickening or biliary dilatation.

Pancreas: Unremarkable. No pancreatic ductal dilatation or
surrounding inflammatory changes.

Spleen: Normal in size without focal abnormality.

Adrenals/Urinary Tract: Adrenal glands are unremarkable. Kidneys are
normal, without renal calculi, focal lesion, or hydronephrosis.
Bladder is unremarkable.

Stomach/Bowel: Stomach is within normal limits. Appendix appears
normal. No evidence of bowel wall thickening, distention, or
inflammatory changes.

Vascular/Lymphatic: No significant vascular findings are present. No
enlarged abdominal or pelvic lymph nodes.

Reproductive: Status post hysterectomy. Subcentimeter cysts are seen
within the bilateral adnexa.

Other: No abdominal wall hernia or abnormality. No abdominopelvic
ascites.

Musculoskeletal: No acute or significant osseous findings.
IMPRESSION: 1. Hepatic steatosis.
2. Cholelithiasis without evidence of acute cholecystitis.
3. Subcentimeter bilateral ovarian cysts, likely ovarian in origin.

## 2019-07-20 MED ORDER — CEFTRIAXONE SODIUM 2 G IJ SOLR
2.0000 g | Freq: Once | INTRAMUSCULAR | Status: AC
  Administered 2019-07-20: 2 g via INTRAVENOUS
  Filled 2019-07-20: qty 20

## 2019-07-20 MED ORDER — ONDANSETRON HCL 4 MG PO TABS: 4.0000 mg | ORAL_TABLET | Freq: Four times a day (QID) | ORAL | Status: DC | PRN

## 2019-07-20 MED ORDER — PROCHLORPERAZINE EDISYLATE 10 MG/2ML IJ SOLN
5.0000 mg | INTRAMUSCULAR | Status: DC | PRN
  Administered 2019-07-20: 5 mg via INTRAVENOUS
  Filled 2019-07-20: qty 2

## 2019-07-20 MED ORDER — POTASSIUM CHLORIDE IN NACL 40-0.9 MEQ/L-% IV SOLN
INTRAVENOUS | Status: DC
  Filled 2019-07-20 (×4): qty 1000

## 2019-07-20 MED ORDER — CYCLOBENZAPRINE HCL 10 MG PO TABS
10.0000 mg | ORAL_TABLET | Freq: Three times a day (TID) | ORAL | 0 refills | Status: DC
Start: 2019-07-20 — End: 2021-03-20

## 2019-07-20 MED ORDER — POTASSIUM CHLORIDE 10 MEQ/100ML IV SOLN
10.0000 meq | INTRAVENOUS | Status: AC
  Administered 2019-07-20 (×4): 10 meq via INTRAVENOUS
  Filled 2019-07-20 (×4): qty 100

## 2019-07-20 MED ORDER — SODIUM CHLORIDE 0.9 % IV BOLUS
1000.0000 mL | Freq: Once | INTRAVENOUS | Status: AC
  Administered 2019-07-20: 1000 mL via INTRAVENOUS

## 2019-07-20 MED ORDER — IOHEXOL 300 MG/ML  SOLN
100.0000 mL | Freq: Once | INTRAMUSCULAR | Status: AC | PRN
  Administered 2019-07-20: 100 mL via INTRAVENOUS

## 2019-07-20 MED ORDER — PANTOPRAZOLE SODIUM 40 MG PO TBEC: 40.0000 mg | DELAYED_RELEASE_TABLET | Freq: Two times a day (BID) | ORAL | 3 refills | Status: DC

## 2019-07-20 MED ORDER — MAGNESIUM SULFATE 2 GM/50ML IV SOLN
2.0000 g | Freq: Once | INTRAVENOUS | Status: AC
  Administered 2019-07-20: 2 g via INTRAVENOUS
  Filled 2019-07-20: qty 50

## 2019-07-20 MED ORDER — PANTOPRAZOLE SODIUM 40 MG IV SOLR
40.0000 mg | INTRAVENOUS | Status: DC
  Administered 2019-07-20: 40 mg via INTRAVENOUS
  Filled 2019-07-20: qty 40

## 2019-07-20 MED ORDER — ONDANSETRON 4 MG PO TBDP
4.0000 mg | ORAL_TABLET | Freq: Three times a day (TID) | ORAL | 0 refills | Status: DC | PRN
Start: 2019-07-20 — End: 2021-03-20

## 2019-07-20 MED ORDER — ONDANSETRON HCL 4 MG/2ML IJ SOLN: 4.0000 mg | Freq: Four times a day (QID) | INTRAMUSCULAR | Status: DC | PRN

## 2019-07-20 MED ORDER — IOHEXOL 9 MG/ML PO SOLN
ORAL | Status: AC
  Filled 2019-07-20: qty 1000

## 2019-07-20 MED ORDER — PROMETHAZINE HCL 25 MG/ML IJ SOLN
25.0000 mg | Freq: Once | INTRAMUSCULAR | Status: AC
  Administered 2019-07-20: 25 mg via INTRAMUSCULAR
  Filled 2019-07-20: qty 1

## 2019-07-20 NOTE — Progress Notes (Signed)
Ate all of sandwich and no nausea or vomiting.  Did have diarhhea .  IV removed and discharge instructions reviewed.  To F/U with GI outpatient

## 2019-07-20 NOTE — ED Notes (Signed)
Pt reports N/V x 2 weeks   hse reports she has been seen at [facility] for same given fluids and potassium and comes here today for same   In past, she reports she has been dx'd w UTI  She also reports she has been told in past she had a gall bladder problem

## 2019-07-20 NOTE — ED Provider Notes (Addendum)
[facility] EMERGENCY DEPARTMENT Provider Note   CSN: [id number] Arrival date & time: 07/19/19  2058   Time seen 2:39 AM  History Chief Complaint  Patient presents with  . Emesis    Erica Banks is a 39 y.o. female.  HPI   Patient states she started having nausea and vomiting over 2 weeks ago.  She states she is vomiting 5-6 times a day without blood.  She denies abdominal pain but later during the course of her conversation she indicates she has pain in her right upper quadrant that feels like a rock sitting on her abdomen.  She denies diarrhea and then states maybe she had some mild diarrhea.  She has decreased appetite and is unable to eat or drink for the past 2 weeks.  She denies fever.  She states she has a dry mouth and decreased urinary output.  She states she feels dizzy and lightheaded especially when she stands up.  She was seen at [facility] on June 17 and was given IV fluids.  They diagnosed her for a urinary tract infection and gave her IV Rocephin and discharged her home with oral antibiotics which she has been unable to take.  She states she felt better when she left the ED however a few hours later she started feeling bad again.  She states she had a similar episode in April when she was admitted to the hospital for 2 days.  They diagnosed her with gallstones.  She was supposed to see a Careers adviser but did not.  She states she has lost 40 pounds since that admission.  She states Zofran never works for her nausea.  PCP Adam Phenix, MD  Past Medical History:  Diagnosis Date  . Panic attacks     Patient Active Problem List   Diagnosis Date Noted  . Intractable nausea and vomiting 07/20/2019  . Anemia 01/01/2015  . Symptomatic anemia 01/01/2015  . Hypokalemia 01/01/2015  . Acute gastroenteritis 01/01/2015  . Abnormal CT of the abdomen 06/08/2013  . GERD (gastroesophageal reflux disease) 06/08/2013    Past Surgical History:  Procedure Laterality Date  .  BIOPSY N/A 06/24/2013   Procedure: BIOPSY TERMINAL ILEUM;  Surgeon: Corbin Ade, MD;  [facility];  Service: Endoscopy;  Laterality: N/A;  . COLONOSCOPY N/A 06/24/2013   WUJ:[id number] hemorrhoids/otherwise normal   . nasal bone surgery    . NASAL SINUS SURGERY    . SINUS IRRIGATION    . TONSILLECTOMY    . TUBAL LIGATION       OB History    Gravida  2   Para  2   Term  2   Preterm      AB      Living  2     SAB      TAB      Ectopic      Multiple      Live Births              Family History  Problem Relation Age of Onset  . Diabetes Mother   . Hypertension Mother   . COPD Mother   . Diabetes Sister   . Hypertension Sister   . Cancer Other   . Crohn's disease Maternal Grandmother   . Colon cancer Neg Hx     Social History   Tobacco Use  . Smoking status: Current Every Day Smoker    Packs/day: 1.00    Years: 12.00    Pack  years: 12.00    Types: Cigarettes  . Smokeless tobacco: Never Used  Substance Use Topics  . Alcohol use: Yes    Alcohol/week: 4.0 - 5.0 standard drinks    Types: 4 - 5 Cans of beer per week    Comment: occ  . Drug use: No  lives at home Lives with spouse  Home Medications Prior to Admission medications   Medication Sig Start Date End Date Taking? Authorizing Provider  cyclobenzaprine (FLEXERIL) 10 MG tablet Take 1 tablet (10 mg total) by mouth 3 (three) times daily. 12/30/16   Ivery Quale, PA-C  ferrous gluconate (FERGON) 324 MG tablet Take 1 tablet (324 mg total) by mouth 2 (two) times daily with a meal. Patient taking differently: Take 324 mg by mouth every other day.  01/02/15   Erick Blinks, MD  pantoprazole (PROTONIX) 40 MG tablet Take 40 mg by mouth daily.    [provider]    Allergies    Demerol  Review of Systems   Review of Systems  All other systems reviewed and are negative.   Physical Exam Updated Vital Signs BP 128/87 (BP Location: Right Arm)   Pulse 82   Temp 98.4 F  (36.9 C) (Oral)   Resp 19   Ht 5\' 6"  (1.676 m)   Wt 72.6 kg   LMP  (LMP Unknown)   SpO2 100%   BMI 25.82 kg/m   Physical Exam Vitals and nursing note reviewed.  Constitutional:      Appearance: Normal appearance. She is normal weight.  HENT:     Head: Normocephalic and atraumatic.     Right Ear: External ear normal.     Left Ear: External ear normal.     Nose: Nose normal.     Mouth/Throat:     Mouth: Mucous membranes are dry.  Eyes:     Extraocular Movements: Extraocular movements intact.     Conjunctiva/sclera: Conjunctivae normal.     Pupils: Pupils are equal, round, and reactive to light.  Cardiovascular:     Rate and Rhythm: Normal rate and regular rhythm.     Pulses: Normal pulses.     Heart sounds: Normal heart sounds.  Pulmonary:     Effort: Pulmonary effort is normal. No respiratory distress.     Breath sounds: Normal breath sounds.  Abdominal:     General: Bowel sounds are normal.     Palpations: Abdomen is soft.     Tenderness: There is abdominal tenderness in the right lower quadrant.    Musculoskeletal:        General: Normal range of motion.     Cervical back: Normal range of motion.  Skin:    General: Skin is warm and dry.  Neurological:     General: No focal deficit present.     Mental Status: She is alert and oriented to person, place, and time.     Cranial Nerves: No cranial nerve deficit.  Psychiatric:        Mood and Affect: Affect is flat.        Speech: Speech is delayed.        Behavior: Behavior is slowed.     ED Results / Procedures / Treatments   Labs (all labs ordered are listed, but only abnormal results are displayed) Results for orders placed or performed during the hospital encounter of 07/20/19  CBC  Result Value Ref Range   WBC 8.3 4.0 - 10.5 K/uL   RBC 5.08 3.87 - 5.11  MIL/uL   Hemoglobin 14.4 12.0 - 15.0 g/dL   HCT 42.7 36 - 46 %   MCV 84.1 80.0 - 100.0 fL   MCH 28.3 26.0 - 34.0 pg   MCHC 33.7 30.0 - 36.0 g/dL   RDW  16.0 (H) 11.5 - 15.5 %   Platelets 522 (H) 150 - 400 K/uL   nRBC 0.0 0.0 - 0.2 %  Basic metabolic panel  Result Value Ref Range   Sodium 133 (L) 135 - 145 mmol/L   Potassium 2.9 (L) 3.5 - 5.1 mmol/L   Chloride 97 (L) 98 - 111 mmol/L   CO2 19 (L) 22 - 32 mmol/L   Glucose, Bld 122 (H) 70 - 99 mg/dL   BUN <5 (L) 6 - 20 mg/dL   Creatinine, Ser 0.62 0.44 - 1.00 mg/dL   Calcium 9.3 8.9 - 10.3 mg/dL   GFR calc non Af Amer >60 >60 mL/min   GFR calc Af Amer >60 >60 mL/min   Anion gap 17 (H) 5 - 15  Lipase, blood  Result Value Ref Range   Lipase 24 11 - 51 U/L  Urinalysis, Routine w reflex microscopic  Result Value Ref Range   Color, Urine AMBER (A) YELLOW   APPearance CLOUDY (A) CLEAR   Specific Gravity, Urine 1.026 1.005 - 1.030   pH 5.0 5.0 - 8.0   Glucose, UA NEGATIVE NEGATIVE mg/dL   Hgb urine dipstick NEGATIVE NEGATIVE   Bilirubin Urine MODERATE (A) NEGATIVE   Ketones, ur 5 (A) NEGATIVE mg/dL   Protein, ur 100 (A) NEGATIVE mg/dL   Nitrite NEGATIVE NEGATIVE   Leukocytes,Ua NEGATIVE NEGATIVE   RBC / HPF 0-5 0 - 5 RBC/hpf   WBC, UA 6-10 0 - 5 WBC/hpf   Bacteria, UA NONE SEEN NONE SEEN   Squamous Epithelial / LPF >50 (H) 0 - 5   Mucus PRESENT    Non Squamous Epithelial 0-5 (A) NONE SEEN  Lactic acid, plasma  Result Value Ref Range   Lactic Acid, Venous 2.2 (HH) 0.5 - 1.9 mmol/L  Hepatic function panel  Result Value Ref Range   Total Protein 6.8 6.5 - 8.1 g/dL   Albumin 3.4 (L) 3.5 - 5.0 g/dL   AST 172 (H) 15 - 41 U/L   ALT 102 (H) 0 - 44 U/L   Alkaline Phosphatase 74 38 - 126 U/L   Total Bilirubin 1.4 (H) 0.3 - 1.2 mg/dL   Bilirubin, Direct 0.5 (H) 0.0 - 0.2 mg/dL   Indirect Bilirubin 0.9 0.3 - 0.9 mg/dL   Laboratory interpretation all normal except hyponatremia, hypokalemia, low chloride, all consistent with dehydration, low bicarb with elevated anion gap, nonfasting hyperglycemia, contaminated urine but she does have positive urine bilirubin,    EKG EKG  Interpretation  Date/Time:  Tuesday July 20 2019 04:38:01 EDT Ventricular Rate:  85 PR Interval:    QRS Duration: 86 QT Interval:  460 QTC Calculation: 548 R Axis:   1 Text Interpretation: Sinus rhythm Nonspecific T abnormalities, anterior leads Prolonged QT interval No significant change since last tracing 09 Nov 2008 Confirmed by Rolland Porter [phone]) on 07/20/2019 4:54:48 AM   Radiology No results found.  Procedures Procedures (including critical care time)       Medications Ordered in ED Medications  cefTRIAXone (ROCEPHIN) 2 g in sodium chloride 0.9 % 100 mL IVPB (2 g Intravenous New Bag/Given 07/20/19 0430)  potassium chloride 10 mEq in 100 mL IVPB (has no administration in time range)  sodium chloride 0.9 %  bolus 1,000 mL (1,000 mLs Intravenous New Bag/Given 07/20/19 0350)  sodium chloride 0.9 % bolus 1,000 mL (0 mLs Intravenous Stopped 07/20/19 0349)  promethazine (PHENERGAN) injection 25 mg (25 mg Intramuscular Given 07/20/19 0308)    ED Course  I have reviewed the triage vital signs and the nursing notes.  Pertinent labs & imaging results that were available during my care of the patient were reviewed by me and considered in my medical decision making (see chart for details).    MDM Rules/Calculators/A&P                          Patient appears to be very dehydrated.  She states Zofran does not help with her nausea.  She was given Phenergan IM.  I went into the radiology files and was able to see her CT reports and ultrasound reports from [facility].  On April 22 she had an ultrasound showing multiple gallstones without cholecystitis.  On April 27 she had a CT of the abdomen and pelvis again showing gallstones without cholecystitis.  From what I can tell she has not had a nuclear medicine study done.  Her hypokalemia was treated with IV potassium for a total of 40 mEq.  Her lactic acidosis was treated with IV fluids.  Patient presents with 40 pound weight loss and inability  to eat or drink for over 2 weeks.  She has known gallstones and has right upper quadrant pain.  She has elevation of her LFTs tonight and she does have a metabolic acidosis and hypokalemia from her nausea and vomiting.  Her urine has bilirubin in it.  She also may have a urinary tract infection, however the sample was contaminated with lots of squamous cells.  She was given Rocephin last week and then discharged with an oral antibiotic that she has been unable to take due to vomiting.  She was given Rocephin 2 g IV which will cover her urinary tract infection and also cholecystitis.  I will talk to the hospitalist about admission.  4:24 AM Dr. Robb Matar, hospitalist will admit.  Final Clinical Impression(s) / ED Diagnoses Final diagnoses:  RUQ pain  Gallstones  Dehydration  Intractable vomiting with nausea, unspecified vomiting type  Lactic acidosis  Metabolic acidosis  Hypokalemia  Urinary tract infection without hematuria, site unspecified    Rx / DC Orders  Plan admission  Devoria Albe, MD, Concha Pyo, MD 07/20/19 9476    Devoria Albe, MD 07/20/19 [phone]

## 2019-07-20 NOTE — ED Notes (Signed)
Date and time results received: 07/20/19 0352 (use smartphrase ".now" to insert current time)  Test: lactic acid Critical Value: 2.2  Name of Provider Notified: dr Lynelle Doctor  Orders Received? Or Actions Taken?: Actions Taken: no orders received

## 2019-07-20 NOTE — Telephone Encounter (Signed)
Erica Banks, Dr. Mariea Clonts ([facility]) is requesting GI follow-up for patient. We did not see her here at the hospital. She is getting discharged. She needs OV for further evaluation of nausea/vomiting/weight loss. She would be a new patient.

## 2019-07-20 NOTE — Progress Notes (Signed)
Has had no diarrhea or bm since Sunday.  Since admission to floor earlier has had no vomiting.  Drank two bottles of contrast and was given compazine iv and did not vomit.  After returning from scan wanted to eat food that friend had brought earlier.

## 2019-07-20 NOTE — H&P (Signed)
4        History and Physical    Erica Banks WCH:[id number] DOB: [birth year] DOA: 07/20/2019  PCP: Adam Phenix, MD  Patient coming from: Home.  I have personally briefly reviewed patient's old medical records in [facility]  Chief Complaint: Abdominal pain, nausea and vomiting.  HPI: Erica Banks is a 39 y.o. female with medical history significant of panic attacks, hysterectomy due to bleeding fibroid and symptomatic anemia in 2020+ below past surgical history who is coming to the emergency department with complaints of a 2-week history of abdominal pain associated with nausea and multiple episodes of emesis every day.  She denies diarrhea, constipation, melena or hematochezia.  No flank pain, dysuria, frequency or hematuria.  She denies fever, chills, sore throat or rhinorrhea.  No wheezing, dyspnea or hemoptysis.  Denies chest pain, palpitations, diaphoresis, PND, orthopnea, but she mentions that last month she had transient lower extremity edema.  She denies polyuria, polydipsia, polyphagia or blurred vision.  The patient mentions that she was admitted for 2 days at [facility].  During this hospitalization she had a CT abdomen/pelvis with contrast done, which showed cholelithiasis.  ED Course: Initial vital signs were 98.9 F, pulse 102, respiration 18, blood pressure 120/72 mmHg and O2 sat 100% on room air.  The patient received 2000 mL of NS bolus, 25 mg of Phenergan IVPB and 2 g of ceftriaxone IVPB.  Her urinalysis had an amber color with cloudy appearance.  Moderate bilirubin.  Ketonuria 05 and proteinuria 100 mg/dL.  CBC shows white count 8.3, hemoglobin 14.4 g/dL and platelets 353.  Lipase was 24.  Sodium 133, potassium 2.9, chloride 97 and CO2 19 mmol/L.  Anion gap was 17.  Lactic acid 2.2 and then 1.7 mmol/L.  Renal function was normal.  Total protein 6.8, albumin 3.4 g/dL.  AST 172, ALT 102 and alkaline phosphatase 74.  Total bilirubin was 1.4 and direct bilirubin 0.5  mg/dL.  Review of Systems: As per HPI otherwise all other systems reviewed and are negative.  Past Medical History:  Diagnosis Date  . Panic attacks    Past Surgical History:  Procedure Laterality Date  . BIOPSY N/A 06/24/2013   Procedure: BIOPSY TERMINAL ILEUM;  Surgeon: Corbin Ade, MD;  Location[facility];  Service: Endoscopy;  Laterality: N/A;  . COLONOSCOPY N/A 06/24/2013   [id number] hemorrhoids/otherwise normal   . nasal bone surgery    . NASAL SINUS SURGERY    . SINUS IRRIGATION    . TONSILLECTOMY    . TUBAL LIGATION     Social History  reports that she has been smoking cigarettes. She has a 12.00 pack-year smoking history. She has never used smokeless tobacco. She reports current alcohol use of about 4.0 - 5.0 standard drinks of alcohol per week. She reports that she does not use drugs.  Allergies  Allergen Reactions  . Demerol Anaphylaxis    Per patient cardiac arrest   Family History  Problem Relation Age of Onset  . Diabetes Mother   . Hypertension Mother   . COPD Mother   . Diabetes Sister   . Hypertension Sister   . Cancer Other   . Crohn's disease Maternal Grandmother   . Colon cancer Neg Hx    Prior to Admission medications   Medication Sig Start Date End Date Taking? Authorizing Provider  cyclobenzaprine (FLEXERIL) 10 MG tablet Take 1 tablet (10 mg total) by mouth 3 (three) times daily. 12/30/16  Lily Kocher, PA-C  ferrous gluconate (FERGON) 324 MG tablet Take 1 tablet (324 mg total) by mouth 2 (two) times daily with a meal. Patient taking differently: Take 324 mg by mouth every other day.  01/02/15   Kathie Dike, MD  pantoprazole (PROTONIX) 40 MG tablet Take 40 mg by mouth daily.    [provider]   Physical Exam: Vitals:   07/20/19 0534 07/20/19 0535 07/20/19 0536 07/20/19 0537  BP:      Pulse: 94 92 89 91  Resp: 16 (!) 25 11 19   Temp:      TempSrc:      SpO2: 100% 100% 100% 100%  Weight:      Height:        Constitutional: NAD, calm, comfortable Eyes: PERRL, lids and conjunctivae normal ENMT: Mucous membranes are dry. Posterior pharynx clear of any exudate or lesions. Neck: normal, supple, no masses, no thyromegaly Respiratory: Decreased breath sounds in bases, otherwise clear to auscultation bilaterally, no wheezing, no crackles. Normal respiratory effort. No accessory muscle use.  Cardiovascular: Regular rate and rhythm, no murmurs / rubs / gallops. No extremity edema. 2+ pedal pulses. No carotid bruits.  Abdomen: Nondistended.  BS positive.  Soft, positive RUQ tenderness, no guarding or rebound, no masses palpated. No hepatosplenomegaly. Bowel sounds positive.  Musculoskeletal: no clubbing / cyanosis. Good ROM, no contractures. Normal muscle tone.  Skin: no rashes, lesions, ulcers on very limited dermatological examination. Neurologic: CN 2-12 grossly intact. Sensation intact, DTR normal. Strength 5/5 in all 4.  Psychiatric: Normal judgment and insight. Alert and oriented x 3. Normal mood.   Labs on Admission: I have personally reviewed following labs and imaging studies  CBC: Recent Labs  Lab 07/19/19 2209  WBC 8.3  HGB 14.4  HCT 42.7  MCV 84.1  PLT 865*   Basic Metabolic Panel: Recent Labs  Lab 07/19/19 2209  NA 133*  K 2.9*  CL 97*  CO2 19*  GLUCOSE 122*  BUN <5*  CREATININE 0.62  CALCIUM 9.3   GFR: Estimated Creatinine Clearance: 98.2 mL/min (by C-G formula based on SCr of 0.62 mg/dL).  Liver Function Tests: Recent Labs  Lab 07/19/19 2209  AST 172*  ALT 102*  ALKPHOS 74  BILITOT 1.4*  PROT 6.8  ALBUMIN 3.4*   Urine analysis:    Component Value Date/Time   COLORURINE AMBER (A) 07/19/2019 2115   APPEARANCEUR CLOUDY (A) 07/19/2019 2115   LABSPEC 1.026 07/19/2019 2115   PHURINE 5.0 07/19/2019 2115   GLUCOSEU NEGATIVE 07/19/2019 2115   HGBUR NEGATIVE 07/19/2019 2115   BILIRUBINUR MODERATE (A) 07/19/2019 2115   KETONESUR 5 (A) 07/19/2019 2115    PROTEINUR 100 (A) 07/19/2019 2115   UROBILINOGEN 0.2 04/10/2013 1432   NITRITE NEGATIVE 07/19/2019 2115   LEUKOCYTESUR NEGATIVE 07/19/2019 2115   Radiological Exams on Admission: No results found.  EKG: Independently reviewed.  Vent. rate 85 BPM PR interval * ms QRS duration 86 ms QT/QTc 460/548 ms P-R-T axes 36 1 -15 Sinus rhythm Nonspecific T abnormalities, anterior leads Prolonged QT interval  Assessment/Plan Principal Problem:   Intractable nausea and vomiting Observation/telemetry. Keep n.p.o. Continue IV fluids. Analgesics as needed. Antiemetics as needed. Protonix 40 mg IVP every 24 hours. Check RUQ ultrasound. Consult general surgery.  Active Problems:   Cholelithiasis Check RUQ ultrasound. Follow-up liver function test. Consult general surgery.    Hypokalemia Replacing. Follow-up potassium level.    Prolonged QT interval Correct hypokalemia. Magnesium was supplemented. Avoid medications that may prolong QT  interval.    GERD (gastroesophageal reflux disease) Protonix 40 mg IVP every 24 hours.   DVT prophylaxis: SCDs.   Code Status:   Full code. Family Communication: Disposition Plan:   Patient is from:  Home.  Anticipated DC to:  Home.  Anticipated DC date:  07/21/2019.  Anticipated DC barriers: Clinical improvement. Consults called:  Routine general surgery consult. Admission status:  Observation/telemetry.  Severity of Illness:  Medium to high severity.  Bobette Mo MD [facility]  How to contact the [facility] Attending or Consulting provider 7A - 7P or covering provider during after hours 7P -7A, for this patient?   1. Check the care team in [facility] and look for a) attending/consulting [facility] provider listed and b) the [facility] team listed 2. Log into [email or web address] and use 's universal password to access. If you do not have the password, please contact the hospital operator. 3. Locate the [facility] provider you are looking for under  [facility] and page to a number that you can be directly reached. 4. If you still have difficulty reaching the provider, please page the [facility] (Director on Call) for the Hospitalists listed on amion for assistance.  07/20/2019, 6:37 AM   This document was prepared using Dragon voice recognition software and may contain some unintended transcription errors.

## 2019-07-20 NOTE — Discharge Summary (Addendum)
Erica Banks, is a 39 y.o. female  DOB 24-Dec-1980  MRN [id number].  Admission date:  07/20/2019  Admitting Physician  Reubin Milan, MD  Discharge Date:  07/20/2019   Primary MD  Shelbie Ammons, MD  Recommendations for primary care physician for things to follow:   1)Avoid ibuprofen/Advil/Aleve/Motrin/Goody Powders/Naproxen/BC powders/Meloxicam/Diclofenac/Indomethacin and other Nonsteroidal anti-inflammatory medications as these will make you more likely to bleed and can cause stomach ulcers, can also cause Kidney problems.   2) complete abstinence from alcohol strongly advised  3) follow-up with gastroenterologist for possible upper endoscopy/EGD test possible HIDA scan strongly advised----you will get a call from the gastroenterology clinic in [place] within the next several days to set up your appointment  Admission Diagnosis  Dehydration [E86.0] Hypokalemia [E87.6] Lactic acidosis [D40.8] Metabolic acidosis [X44.8] Gallstones [K80.20] RUQ abdominal pain [R10.11] RUQ pain [R10.11] Intractable nausea and vomiting [R11.2] Urinary tract infection without hematuria, site unspecified [N39.0] Intractable vomiting with nausea, unspecified vomiting type [R11.2]   Discharge Diagnosis  Dehydration [E86.0] Hypokalemia [E87.6] Lactic acidosis [J85.6] Metabolic acidosis [D14.9] Gallstones [K80.20] RUQ abdominal pain [R10.11] RUQ pain [R10.11] Intractable nausea and vomiting [R11.2] Urinary tract infection without hematuria, site unspecified [N39.0] Intractable vomiting with nausea, unspecified vomiting type [R11.2]    Principal Problem:   Intractable nausea and vomiting Active Problems:   GERD (gastroesophageal reflux disease)   Hypokalemia   Cholelithiasis   Prolonged QT interval      Past Medical History:  Diagnosis Date  . Panic attacks     Past Surgical History:    Procedure Laterality Date  . BIOPSY N/A 06/24/2013   Procedure: BIOPSY TERMINAL ILEUM;  Surgeon: Daneil Dolin, MD;  [facility];  Service: Endoscopy;  Laterality: N/A;  . COLONOSCOPY N/A 06/24/2013   FWY:OVZCHYIF hemorrhoids/otherwise normal   . nasal bone surgery    . NASAL SINUS SURGERY    . SINUS IRRIGATION    . TONSILLECTOMY    . TUBAL LIGATION       HPI  from the history and physical done on the day of admission:    Chief Complaint: Abdominal pain, nausea and vomiting.  HPI: Erica Banks is a 39 y.o. female with medical history significant of panic attacks, hysterectomy due to bleeding fibroid and symptomatic anemia in 2020+ below past surgical history who is coming to the emergency department with complaints of a 2-week history of abdominal pain associated with nausea and multiple episodes of emesis every day.  She denies diarrhea, constipation, melena or hematochezia.  No flank pain, dysuria, frequency or hematuria.  She denies fever, chills, sore throat or rhinorrhea.  No wheezing, dyspnea or hemoptysis.  Denies chest pain, palpitations, diaphoresis, PND, orthopnea, but she mentions that last month she had transient lower extremity edema.  She denies polyuria, polydipsia, polyphagia or blurred vision.  The patient mentions that she was admitted for 2 days at [facility].  During this hospitalization she had a CT abdomen/pelvis with contrast done, which showed cholelithiasis.  ED Course:  Initial vital signs were 98.9 F, pulse 102, respiration 18, blood pressure 120/72 mmHg and O2 sat 100% on room air.  The patient received 2000 mL of NS bolus, 25 mg of Phenergan IVPB and 2 g of ceftriaxone IVPB.  Her urinalysis had an amber color with cloudy appearance.  Moderate bilirubin.  Ketonuria 05 and proteinuria 100 mg/dL.  CBC shows white count 8.3, hemoglobin 14.4 g/dL and platelets 580.  Lipase was 24.  Sodium 133, potassium 2.9, chloride 97 and CO2 19 mmol/L.  Anion gap was 17.   Lactic acid 2.2 and then 1.7 mmol/L.  Renal function was normal.  Total protein 6.8, albumin 3.4 g/dL.  AST 172, ALT 102 and alkaline phosphatase 74.  Total bilirubin was 1.4 and direct bilirubin 0.5 mg/dL.  Review of Systems: As per HPI otherwise all other systems reviewed and are negative.   Hospital Course:     1)Persistent Nausea and Vomiting----now resolved -Abdominal ultrasound with cholelithiasis without acute cholecystitis -CT abdomen and pelvis with contrast without acute findings -Curbside conversations with Dr. Lovell Sheehan the general surgeon -Patient with no further emesis, no diarrhea, epigastric discomfort has improved significantly, patient eating and drinking well patient desires to be discharged home -Discussed with GI service they will give patient outpatient GI appointment to discuss EGD and possible HIDA scan due to recurrent episodes of nausea and vomiting with concerns about weight loss --CBC WNL without leukocytosis, patient is afebrile, lipase is not elevated, =-Lactic acid improved with IV fluids  2)Hypokalemia/Hypomagnesemia with prolonged QT interval --- due to GI losses, replaced  3)Cholelithiasis without acute Cholecystitis--- please see #1 above  4)GERD--- Protonix as prescribed, GI follow-up as above #1  5) alcoholic hepatitis--- abdominal imaging suggesting fatty liver, LFTs elevated with AST higher than ALT consistent with alcoholic liver disease----complete abstinence from alcohol advised -OutPatient follow-up with GI advised  Discharge Condition: stable  Follow UP   Follow-up Information    Letta Median, PA-C. Schedule an appointment as soon as possible for a visit in 1 week(s).   Specialty: Gastroenterology Contact information: [address] [place] [address] [phone]               Consults obtained -curbside conversations with general surgeon and GI service  Diet and Activity recommendation:  As advised  Discharge  Instructions    Discharge Instructions    Call MD for:  difficulty breathing, headache or visual disturbances   Complete by: As directed    Call MD for:  persistant dizziness or light-headedness   Complete by: As directed    Call MD for:  persistant nausea and vomiting   Complete by: As directed    Call MD for:  severe uncontrolled pain   Complete by: As directed    Call MD for:  temperature >100.4   Complete by: As directed    Diet - low sodium heart healthy   Complete by: As directed    Discharge instructions   Complete by: As directed    1)Avoid ibuprofen/Advil/Aleve/Motrin/Goody Powders/Naproxen/BC powders/Meloxicam/Diclofenac/Indomethacin and other Nonsteroidal anti-inflammatory medications as these will make you more likely to bleed and can cause stomach ulcers, can also cause Kidney problems.   2) complete abstinence from alcohol strongly advised  3) follow-up with gastroenterologist for possible upper endoscopy/EGD test possible HIDA scan strongly advised----you will get a call from the gastroenterology clinic in [place] within the next several days to set up your appointment   Increase activity slowly   Complete by: As directed  Discharge Medications     Allergies as of 07/20/2019      Reactions   Demerol Anaphylaxis   Per patient cardiac arrest      Medication List    STOP taking these medications   ferrous gluconate 324 MG tablet Commonly known as: FERGON     TAKE these medications   cyclobenzaprine 10 MG tablet Commonly known as: FLEXERIL Take 1 tablet (10 mg total) by mouth 3 (three) times daily.   ondansetron 4 MG disintegrating tablet Commonly known as: Zofran ODT Take 1 tablet (4 mg total) by mouth every 8 (eight) hours as needed for nausea or vomiting.   pantoprazole 40 MG tablet Commonly known as: PROTONIX Take 1 tablet (40 mg total) by mouth 2 (two) times daily before a meal. What changed: when to take this       Major procedures  and Radiology Reports - PLEASE review detailed and final reports for all details, in brief -     CT ABDOMEN PELVIS W CONTRAST  Result Date: 07/20/2019 CLINICAL DATA:  Fatigue and abdominal pain. EXAM: CT ABDOMEN AND PELVIS WITH CONTRAST TECHNIQUE: Multidetector CT imaging of the abdomen and pelvis was performed using the standard protocol following bolus administration of intravenous contrast. CONTRAST:  OMNIPAQUE IOHEXOL 300 MG/ML  SOLN COMPARISON:  May 25, 2019 FINDINGS: Lower chest: Very mild atelectasis is seen within the right lung base. Hepatobiliary: There is diffuse fatty infiltration of the liver parenchyma. No focal liver abnormality is seen. Tiny ill-defined gallstones are suspected without evidence of gallbladder wall thickening or biliary dilatation. Pancreas: Unremarkable. No pancreatic ductal dilatation or surrounding inflammatory changes. Spleen: Normal in size without focal abnormality. Adrenals/Urinary Tract: Adrenal glands are unremarkable. Kidneys are normal, without renal calculi, focal lesion, or hydronephrosis. Bladder is unremarkable. Stomach/Bowel: Stomach is within normal limits. Appendix appears normal. No evidence of bowel wall thickening, distention, or inflammatory changes. Vascular/Lymphatic: No significant vascular findings are present. No enlarged abdominal or pelvic lymph nodes. Reproductive: Status post hysterectomy. Subcentimeter cysts are seen within the bilateral adnexa. Other: No abdominal wall hernia or abnormality. No abdominopelvic ascites. Musculoskeletal: No acute or significant osseous findings. IMPRESSION: 1. Hepatic steatosis. 2. Cholelithiasis without evidence of acute cholecystitis. 3. Subcentimeter bilateral ovarian cysts, likely ovarian in origin. Electronically Signed   By: Aram Candela M.D.   On: 07/20/2019 17:00   US Abdomen Limited RUQ  Result Date: 07/20/2019 CLINICAL DATA:  Right upper quadrant pain, nausea, vomiting for 2 weeks EXAM:  ULTRASOUND ABDOMEN LIMITED RIGHT UPPER QUADRANT COMPARISON:  None. FINDINGS: Gallbladder: Cholelithiasis without gallbladder wall thickening or pericholecystic fluid. Negative sonographic Murphy sign. Common bile duct: Diameter: 2.1 mm Liver: No focal lesion identified. Increased hepatic parenchymal echogenicity. Portal vein is patent on color Doppler imaging with normal direction of blood flow towards the liver. Other: None. IMPRESSION: 1. Cholelithiasis without sonographic evidence of acute cholecystitis. 2. Increased hepatic echogenicity as can be seen with hepatic steatosis. Electronically Signed   By: Elige Ko   On: 07/20/2019 09:00    Micro Results    Recent Results (from the past 240 hour(s))  SARS Coronavirus 2 by RT PCR (hospital order, performed in [facility] hospital lab) Nasopharyngeal Nasopharyngeal Swab     Status: None   Collection Time: 07/20/19  4:09 AM   Specimen: Nasopharyngeal Swab  Result Value Ref Range Status   SARS Coronavirus 2 NEGATIVE NEGATIVE Final    Comment: (NOTE) SARS-CoV-2 target nucleic acids are NOT DETECTED.  The SARS-CoV-2 RNA is  generally detectable in upper and lower respiratory specimens during the acute phase of infection. The lowest concentration of SARS-CoV-2 viral copies this assay can detect is 250 copies / mL. A negative result does not preclude SARS-CoV-2 infection and should not be used as the sole basis for treatment or other patient management decisions.  A negative result may occur with improper specimen collection / handling, submission of specimen other than nasopharyngeal swab, presence of viral mutation(s) within the areas targeted by this assay, and inadequate number of viral copies (<250 copies / mL). A negative result must be combined with clinical observations, patient history, and epidemiological information.  Fact Sheet for Patients:   BoilerBrush.com.cy  Fact Sheet for Healthcare  Providers: https://pope.com/  This test is not yet approved or  cleared by the [place] FDA and has been authorized for detection and/or diagnosis of SARS-CoV-2 by FDA under an Emergency Use Authorization (EUA).  This EUA will remain in effect (meaning this test can be used) for the duration of the COVID-19 declaration under Section 564(b)(1) of the Act, 21 U.S.C. section 360bbb-3(b)(1), unless the authorization is terminated or revoked sooner.  Performed at [facility], [address], [place], [place] [address]        Today   Subjective    Jeny Wisdom today has no new complaints -Please see progress note documented by RN confirming that patient is eating and drinking okay, no further emesis,          Patient has been seen and examined prior to discharge   Objective   Blood pressure 124/82, pulse 85, temperature 98.3 F (36.8 C), temperature source Oral, resp. rate 20, height 5\' 6"  (1.676 m), weight 72.6 kg, SpO2 100 %.  No intake or output data in the 24 hours ending 07/20/19 1821  Exam Gen:- Awake Alert, no acute distress  HEENT:- [place].AT, No sclera icterus Neck-Supple Neck,No JVD,.  Lungs-  CTAB , good air movement bilaterally  CV- S1, S2 normal, regular Abd-  +ve B.Sounds, Abd Soft, No significant epigastric or abdominal tenderness at this time Extremity/Skin:- No  edema,   good pulses Psych-affect is appropriate, oriented x3 Neuro-no new focal deficits, no tremors    Data Review   CBC w Diff:  Lab Results  Component Value Date   WBC 8.3 07/19/2019   HGB 14.4 07/19/2019   HCT 42.7 07/19/2019   PLT 522 (H) 07/19/2019   LYMPHOPCT 25 12/30/2016   MONOPCT 6 12/30/2016   EOSPCT 1 12/30/2016   BASOPCT 1 12/30/2016    CMP:  Lab Results  Component Value Date   NA 133 (L) 07/19/2019   K 2.9 (L) 07/19/2019   CL 97 (L) 07/19/2019   CO2 19 (L) 07/19/2019   BUN <5 (L) 07/19/2019   CREATININE 0.62 07/19/2019   PROT 6.8  07/19/2019   ALBUMIN 3.4 (L) 07/19/2019   BILITOT 1.4 (H) 07/19/2019   ALKPHOS 74 07/19/2019   AST 172 (H) 07/19/2019   ALT 102 (H) 07/19/2019  .   Total Discharge time is about 33 minutes  07/21/2019 M.D on 07/20/2019 at 6:21 PM  Go to [email or web address] -  for contact info  [facility] - Office  [phone]

## 2019-07-20 NOTE — Discharge Instructions (Signed)
1)Avoid ibuprofen/Advil/Aleve/Motrin/Goody Powders/Naproxen/BC powders/Meloxicam/Diclofenac/Indomethacin and other Nonsteroidal anti-inflammatory medications as these will make you more likely to bleed and can cause stomach ulcers, can also cause Kidney problems.   2) complete abstinence from alcohol strongly advised  3) follow-up with gastroenterologist for possible upper endoscopy/EGD test possible HIDA scan strongly advised----you will get a call from the gastroenterology clinic in [place] within the next several days to set up your appointment

## 2019-07-21 LAB — URINE CULTURE: Culture: >=100000 — AB

## 2019-07-23 ENCOUNTER — Encounter: Payer: Self-pay | Admitting: Internal Medicine

## 2019-09-16 NOTE — Progress Notes (Deleted)
Referring Provider: Shelbie Ammons, MD Primary Care Physician:  Shelbie Ammons, MD Primary GI Physician: Dr. Gala Romney  No chief complaint on file.   HPI:    Erica Banks is a 39 y.o. female presenting today for hospital follow-up at the request of Dr. Denton Brick.  History of GERD. Colonoscopy 06/24/2013 due to RLQ abdominal pain and abnormal TI on CT. external hemorrhoids, normal-appearing TI s/p biopsy (benign), normal-appearing colon.  Patient saw [facility] [place] in April 2021 for possible hemorrhoid banding.  Patient was admitted very briefly (17 hours) on 07/20/2019.  She presented to the emergency room with complaints of 2 weeks of abdominal pain with associated nausea and vomiting.  WBC, hemoglobin, lipase within normal limits.  Sodium low at 133, potassium low at 2.9, magnesium low 1.5.  Lactic acid 2.2 then 1.7.  Kidney function within normal limits.  AST 172, ALT 102, alk phos 74, total bilirubin 1.4.  Evidence of UTI on urine culture. Abdominal ultrasound with hepatic steatosis, cholelithiasis without cholecystitis. CT A/P with contrast which revealed cholelithiasis.  She was given IV fluids, potassium and magnesium replaced, antiemetics, and IV Protonix.  Symptoms improved significantly and patient desired to go home.  She was advised to follow-up with GI, continue PPI BID, and due to elevated LFTs with AST greater than ALT and history of alcohol use, she was advised to completely abstain from alcohol.  Today:      Update HFP, was she treated for UTI, question HIDA/EGD      Past Medical History:  Diagnosis Date   Panic attacks     Past Surgical History:  Procedure Laterality Date   BIOPSY N/A 06/24/2013   Procedure: BIOPSY TERMINAL ILEUM;  Surgeon: Daneil Dolin, MD;  [facility];  Service: Endoscopy;  Laterality: N/A;   COLONOSCOPY N/A 06/24/2013   LFY:[id number] hemorrhoids/otherwise normal    nasal bone surgery     NASAL SINUS SURGERY      SINUS IRRIGATION     TONSILLECTOMY     TUBAL LIGATION      Current Outpatient Medications  Medication Sig Dispense Refill   cyclobenzaprine (FLEXERIL) 10 MG tablet Take 1 tablet (10 mg total) by mouth 3 (three) times daily. 20 tablet 0   ondansetron (ZOFRAN ODT) 4 MG disintegrating tablet Take 1 tablet (4 mg total) by mouth every 8 (eight) hours as needed for nausea or vomiting. 20 tablet 0   pantoprazole (PROTONIX) 40 MG tablet Take 1 tablet (40 mg total) by mouth 2 (two) times daily before a meal. 60 tablet 3   No current facility-administered medications for this visit.    Allergies as of 09/17/2019 - Review Complete 07/20/2019  Allergen Reaction Noted   Demerol Anaphylaxis 10/12/2010    Family History  Problem Relation Age of Onset   Diabetes Mother    Hypertension Mother    COPD Mother    Diabetes Sister    Hypertension Sister    Cancer Other    Crohn's disease Maternal Grandmother    Colon cancer Neg Hx     Social History   Socioeconomic History   Marital status: Married    Spouse name: Not on file   Number of children: Not on file   Years of education: Not on file   Highest education level: Not on file  Occupational History   Occupation: Unemployed    Comment: wants to go to nursing school in fall 2015  Tobacco Use   Smoking status:  Current Every Day Smoker    Packs/day: 1.00    Years: 12.00    Pack years: 12.00    Types: Cigarettes   Smokeless tobacco: Never Used  Substance and Sexual Activity   Alcohol use: Yes    Alcohol/week: 4.0 - 5.0 standard drinks    Types: 4 - 5 Cans of beer per week    Comment: occ   Drug use: No   Sexual activity: Yes    Birth control/protection: Surgical  Other Topics Concern   Not on file  Social History Narrative   Not on file   Social Determinants of Health   Financial Resource Strain:    Difficulty of Paying Living Expenses: Not on file  Food Insecurity:    Worried About Paediatric nurse in the Last Year: Not on file   [employer] of Food in the Last Year: Not on file  Transportation Needs:    Lack of Transportation (Medical): Not on file   Lack of Transportation (Non-Medical): Not on file  Physical Activity:    Days of Exercise per Week: Not on file   Minutes of Exercise per Session: Not on file  Stress:    Feeling of Stress : Not on file  Social Connections:    Frequency of Communication with Friends and Family: Not on file   Frequency of Social Gatherings with Friends and Family: Not on file   Attends Religious Services: Not on file   Active Member of Clubs or Organizations: Not on file   Attends Archivist Meetings: Not on file   Marital Status: Not on file    Review of Systems: Gen: Denies fever, chills, anorexia. Denies fatigue, weakness, weight loss.  CV: Denies chest pain, palpitations, syncope, peripheral edema, and claudication. Resp: Denies dyspnea at rest, cough, wheezing, coughing up blood, and pleurisy. GI: Denies vomiting blood, jaundice, and fecal incontinence.   Denies dysphagia or odynophagia. Derm: Denies rash, itching, dry skin Psych: Denies depression, anxiety, memory loss, confusion. No homicidal or suicidal ideation.  Heme: Denies bruising, bleeding, and enlarged lymph nodes.  Physical Exam: LMP  (LMP Unknown)  General:   Alert and oriented. No distress noted. Pleasant and cooperative.  Head:  Normocephalic and atraumatic. Eyes:  Conjuctiva clear without scleral icterus. Mouth:  Oral mucosa pink and moist. Good dentition. No lesions. Heart:  S1, S2 present without murmurs appreciated. Lungs:  Clear to auscultation bilaterally. No wheezes, rales, or rhonchi. No distress.  Abdomen:  +BS, soft, non-tender and non-distended. No rebound or guarding. No HSM or masses noted. Msk:  Symmetrical without gross deformities. Normal posture. Extremities:  Without edema. Neurologic:  Alert and  oriented x4 Psych:  Alert  and cooperative. Normal mood and affect.

## 2019-09-17 ENCOUNTER — Ambulatory Visit: Payer: Medicaid Other | Admitting: Gastroenterology

## 2019-09-17 ENCOUNTER — Encounter: Payer: Self-pay | Admitting: Internal Medicine

## 2020-04-12 DIAGNOSIS — Z91199 Patient's noncompliance with other medical treatment and regimen due to unspecified reason: Secondary | ICD-10-CM | POA: Insufficient documentation

## 2020-08-05 DIAGNOSIS — R160 Hepatomegaly, not elsewhere classified: Secondary | ICD-10-CM | POA: Insufficient documentation

## 2020-08-18 ENCOUNTER — Other Ambulatory Visit: Payer: Self-pay

## 2020-08-18 ENCOUNTER — Emergency Department (HOSPITAL_COMMUNITY)
Admission: EM | Admit: 2020-08-18 | Discharge: 2020-08-18 | Disposition: A | Discharge status: Home / Self Care | Payer: Medicaid Other | Attending: Emergency Medicine | Admitting: Emergency Medicine

## 2020-08-18 ENCOUNTER — Encounter (HOSPITAL_COMMUNITY): Payer: Self-pay | Admitting: *Deleted

## 2020-08-18 DIAGNOSIS — F17210 Nicotine dependence, cigarettes, uncomplicated: Secondary | ICD-10-CM | POA: Insufficient documentation

## 2020-08-18 DIAGNOSIS — D509 Iron deficiency anemia, unspecified: Secondary | ICD-10-CM | POA: Insufficient documentation

## 2020-08-18 DIAGNOSIS — D649 Anemia, unspecified: Secondary | ICD-10-CM

## 2020-08-18 DIAGNOSIS — Y900 Blood alcohol level of less than 20 mg/100 ml: Secondary | ICD-10-CM | POA: Insufficient documentation

## 2020-08-18 DIAGNOSIS — E876 Hypokalemia: Secondary | ICD-10-CM | POA: Insufficient documentation

## 2020-08-18 DIAGNOSIS — Z20822 Contact with and (suspected) exposure to covid-19: Secondary | ICD-10-CM | POA: Diagnosis not present

## 2020-08-18 DIAGNOSIS — Z79899 Other long term (current) drug therapy: Secondary | ICD-10-CM | POA: Diagnosis not present

## 2020-08-18 DIAGNOSIS — R531 Weakness: Secondary | ICD-10-CM | POA: Diagnosis present

## 2020-08-18 DIAGNOSIS — E611 Iron deficiency: Secondary | ICD-10-CM

## 2020-08-18 HISTORY — DX: Iron deficiency: E61.1

## 2020-08-18 HISTORY — DX: Other pulmonary embolism without acute cor pulmonale: I26.99

## 2020-08-18 HISTORY — DX: Acute embolism and thrombosis of unspecified deep veins of unspecified lower extremity: I82.409

## 2020-08-18 LAB — URINALYSIS, ROUTINE W REFLEX MICROSCOPIC
Glucose, UA: NEGATIVE mg/dL
Hgb urine dipstick: NEGATIVE
Ketones, ur: 5 mg/dL — AB
Nitrite: POSITIVE — AB
Protein, ur: 100 mg/dL — AB
Specific Gravity, Urine: 1.032 — ABNORMAL HIGH (ref 1.005–1.030)
WBC, UA: >50 WBC/hpf — ABNORMAL HIGH (ref 0–5)
pH: 5 (ref 5.0–8.0)

## 2020-08-18 LAB — RAPID URINE DRUG SCREEN, HOSP PERFORMED
Amphetamines: NOT DETECTED
Barbiturates: NOT DETECTED
Benzodiazepines: NOT DETECTED
Cocaine: NOT DETECTED
Opiates: NOT DETECTED
Tetrahydrocannabinol: POSITIVE — AB

## 2020-08-18 LAB — ETHANOL: Alcohol, Ethyl (B): <10 mg/dL (ref <10)

## 2020-08-18 LAB — RESP PANEL BY RT-PCR (FLU A&B, COVID) ARPGX2
Influenza A by PCR: NEGATIVE
Influenza B by PCR: NEGATIVE
SARS Coronavirus 2 by RT PCR: NEGATIVE

## 2020-08-18 LAB — IRON AND TIBC
Iron: 6 ug/dL — ABNORMAL LOW (ref 28–170)
Saturation Ratios: 3 % — ABNORMAL LOW (ref 10.4–31.8)
TIBC: 220 ug/dL — ABNORMAL LOW (ref 250–450)
UIBC: 214 ug/dL

## 2020-08-18 LAB — COMPREHENSIVE METABOLIC PANEL
ALT: 37 U/L (ref 0–44)
AST: 106 U/L — ABNORMAL HIGH (ref 15–41)
Albumin: 2.6 g/dL — ABNORMAL LOW (ref 3.5–5.0)
Alkaline Phosphatase: 85 U/L (ref 38–126)
Anion gap: 13 (ref 5–15)
BUN: <5 mg/dL — ABNORMAL LOW (ref 6–20)
CO2: 23 mmol/L (ref 22–32)
Calcium: 8.5 mg/dL — ABNORMAL LOW (ref 8.9–10.3)
Chloride: 98 mmol/L (ref 98–111)
Creatinine, Ser: 0.57 mg/dL (ref 0.44–1.00)
GFR, Estimated: >60 mL/min (ref >60)
Glucose, Bld: 141 mg/dL — ABNORMAL HIGH (ref 70–99)
Potassium: 3 mmol/L — ABNORMAL LOW (ref 3.5–5.1)
Sodium: 134 mmol/L — ABNORMAL LOW (ref 135–145)
Total Bilirubin: 1.2 mg/dL (ref 0.3–1.2)
Total Protein: 6 g/dL — ABNORMAL LOW (ref 6.5–8.1)

## 2020-08-18 LAB — CBC WITH DIFFERENTIAL/PLATELET
Abs Immature Granulocytes: 0.02 10*3/uL (ref 0.00–0.07)
Basophils Absolute: 0 10*3/uL (ref 0.0–0.1)
Basophils Relative: 1 %
Eosinophils Absolute: 0 10*3/uL (ref 0.0–0.5)
Eosinophils Relative: 0 %
HCT: 28.2 % — ABNORMAL LOW (ref 36.0–46.0)
Hemoglobin: 9.1 g/dL — ABNORMAL LOW (ref 12.0–15.0)
Immature Granulocytes: 0 %
Lymphocytes Relative: 17 %
Lymphs Abs: 1.4 10*3/uL (ref 0.7–4.0)
MCH: 30.4 pg (ref 26.0–34.0)
MCHC: 32.3 g/dL (ref 30.0–36.0)
MCV: 94.3 fL (ref 80.0–100.0)
Monocytes Absolute: 0.6 10*3/uL (ref 0.1–1.0)
Monocytes Relative: 8 %
Neutro Abs: 5.9 10*3/uL (ref 1.7–7.7)
Neutrophils Relative %: 74 %
Platelets: 355 10*3/uL (ref 150–400)
RBC: 2.99 MIL/uL — ABNORMAL LOW (ref 3.87–5.11)
RDW: 19.2 % — ABNORMAL HIGH (ref 11.5–15.5)
WBC: 8 10*3/uL (ref 4.0–10.5)
nRBC: 0 % (ref 0.0–0.2)

## 2020-08-18 LAB — VITAMIN B12: Vitamin B-12: 238 pg/mL (ref 180–914)

## 2020-08-18 LAB — RETICULOCYTES
Immature Retic Fract: 26.3 % — ABNORMAL HIGH (ref 2.3–15.9)
RBC.: 2.85 MIL/uL — ABNORMAL LOW (ref 3.87–5.11)
Retic Count, Absolute: 79.5 10*3/uL (ref 19.0–186.0)
Retic Ct Pct: 2.8 % (ref 0.4–3.1)

## 2020-08-18 LAB — FOLATE: Folate: 4.3 ng/mL — ABNORMAL LOW (ref >5.9)

## 2020-08-18 LAB — MAGNESIUM: Magnesium: 1.4 mg/dL — ABNORMAL LOW (ref 1.7–2.4)

## 2020-08-18 LAB — FERRITIN: Ferritin: 11 ng/mL (ref 11–307)

## 2020-08-18 MED ORDER — MAGNESIUM OXIDE -MG SUPPLEMENT 200 MG PO TABS: 1.0000 | ORAL_TABLET | Freq: Two times a day (BID) | ORAL | 0 refills | Status: DC

## 2020-08-18 MED ORDER — LACTATED RINGERS IV BOLUS
3000.0000 mL | Freq: Once | INTRAVENOUS | Status: AC
  Administered 2020-08-18: 3000 mL via INTRAVENOUS

## 2020-08-18 MED ORDER — MAGNESIUM SULFATE 2 GM/50ML IV SOLN
2.0000 g | Freq: Once | INTRAVENOUS | Status: AC
  Administered 2020-08-18: 2 g via INTRAVENOUS
  Filled 2020-08-18: qty 50

## 2020-08-18 MED ORDER — POTASSIUM CHLORIDE CRYS ER 20 MEQ PO TBCR
40.0000 meq | EXTENDED_RELEASE_TABLET | Freq: Once | ORAL | Status: AC
  Administered 2020-08-18: 40 meq via ORAL
  Filled 2020-08-18: qty 2

## 2020-08-18 MED ORDER — POTASSIUM CHLORIDE ER 20 MEQ PO TBCR: 20.0000 meq | EXTENDED_RELEASE_TABLET | Freq: Two times a day (BID) | ORAL | 0 refills | Status: DC

## 2020-08-18 NOTE — ED Notes (Signed)
Dr. Wentz at bedside. 

## 2020-08-18 NOTE — ED Notes (Signed)
Pt removed bp cuff and pulse ox

## 2020-08-18 NOTE — Discharge Instructions (Addendum)
We sent prescriptions for potassium magnesium to your pharmacy.  Call your PCP for an appointment for follow-up on these deficiencies next week.  Call your hematologist to schedule iron infusion as soon as possible.

## 2020-08-18 NOTE — ED Triage Notes (Signed)
Pt c/o generalized weakness that started yesterday. Pt reports she had a lap chole a week and a half ago and has been weak since, but yesterday the weakness became severe. She reports she has been unable to get up out of bed and off the toilet. Denies fever, drainage from surgical sites, n/v.

## 2020-08-18 NOTE — ED Provider Notes (Signed)
[facility] EMERGENCY DEPARTMENT Provider Note   CSN: [id number] Arrival date & time: 08/18/20  0755     History Chief Complaint  Patient presents with   Weakness    Erica Banks is a 40 y.o. female.  HPI She presents for evaluation of generalized weakness characterized by "cannot get off the toilet, and my husband had to help me get out of bed."  She denies nausea, vomiting, diarrhea, anorexia, abdominal pain, focal weakness or paresthesia.  She had a cholecystectomy, 2 weeks ago for prolonged symptoms that resulted with "infected gallbladder."  She states that before the cholecystectomy she was "in bed for 1-1/2 months."  She apparently had persistent vomiting during this period of time.     Past Medical History:  Diagnosis Date   DVT (deep venous thrombosis) (HCC)    Low iron    Panic attacks    Pulmonary embolus [facility])     Patient Active Problem List   Diagnosis Date Noted   Intractable nausea and vomiting 07/20/2019   Cholelithiasis 07/20/2019   Prolonged QT interval 07/20/2019   Anemia 01/01/2015   Symptomatic anemia 01/01/2015   Hypokalemia 01/01/2015   Acute gastroenteritis 01/01/2015   Abnormal CT of the abdomen 06/08/2013   GERD (gastroesophageal reflux disease) 06/08/2013    Past Surgical History:  Procedure Laterality Date   ABDOMINAL HYSTERECTOMY     BIOPSY N/A 06/24/2013   Procedure: BIOPSY TERMINAL ILEUM;  Surgeon: Corbin Ade, MD;  Location: [facility] ENDO SUITE;  Service: Endoscopy;  Laterality: N/A;   CHOLECYSTECTOMY     COLONOSCOPY N/A 06/24/2013   GHW:[id number] hemorrhoids/otherwise normal    nasal bone surgery     NASAL SINUS SURGERY     SINUS IRRIGATION     TONSILLECTOMY     TUBAL LIGATION       OB History     Gravida  2   Para  2   Term  2   Preterm      AB      Living  2      SAB      IAB      Ectopic      Multiple      Live Births              Family History  Problem Relation Age of Onset   Diabetes  Mother    Hypertension Mother    COPD Mother    Diabetes Sister    Hypertension Sister    Cancer Other    Crohn's disease Maternal Grandmother    Colon cancer Neg Hx     Social History   Tobacco Use   Smoking status: Every Day    Packs/day: 1.00    Years: 12.00    Pack years: 12.00    Types: Cigarettes   Smokeless tobacco: Never  Vaping Use   Vaping Use: Never used  Substance Use Topics   Alcohol use: Yes    Alcohol/week: 4.0 - 5.0 standard drinks    Types: 4 - 5 Cans of beer per week    Comment: occ   Drug use: No    Home Medications Prior to Admission medications   Medication Sig Start Date End Date Taking? Authorizing Provider  Magnesium Oxide 200 MG TABS Take 1 tablet (200 mg total) by mouth 2 (two) times daily. 08/18/20  Yes Mancel Bale, MD  pantoprazole (PROTONIX) 40 MG tablet Take 1 tablet (40 mg total) by mouth 2 (two) times daily before  a meal. Patient taking differently: Take 40 mg by mouth daily. 07/20/19  Yes Emokpae, Courage, MD  Potassium Chloride ER 20 MEQ TBCR Take 20 mEq by mouth 2 (two) times daily. 08/18/20  Yes Mancel Bale, MD  cyclobenzaprine (FLEXERIL) 10 MG tablet Take 1 tablet (10 mg total) by mouth 3 (three) times daily. Patient not taking: No sig reported 07/20/19   Shon Hale, MD  ondansetron (ZOFRAN ODT) 4 MG disintegrating tablet Take 1 tablet (4 mg total) by mouth every 8 (eight) hours as needed for nausea or vomiting. Patient not taking: No sig reported 07/20/19   Shon Hale, MD    Allergies    Demerol  Review of Systems   Review of Systems  All other systems reviewed and are negative.  Physical Exam Updated Vital Signs BP 118/65   Pulse (!) 102   Temp 98 F (36.7 C) (Oral)   Resp 16   Ht 5\' 6"  (1.676 m)   Wt 77.1 kg   LMP  (LMP Unknown)   SpO2 100%   BMI 27.44 kg/m   Physical Exam Vitals and nursing note reviewed.  Constitutional:      Appearance: She is well-developed. She is not ill-appearing.  HENT:      Head: Normocephalic and atraumatic.     Right Ear: External ear normal.     Left Ear: External ear normal.  Eyes:     Conjunctiva/sclera: Conjunctivae normal.     Pupils: Pupils are equal, round, and reactive to light.  Neck:     Trachea: Phonation normal.  Cardiovascular:     Rate and Rhythm: Normal rate and regular rhythm.     Heart sounds: Normal heart sounds.  Pulmonary:     Effort: Pulmonary effort is normal.     Breath sounds: Normal breath sounds.  Abdominal:     General: There is no distension.     Palpations: Abdomen is soft.     Tenderness: There is no abdominal tenderness.     Comments: Well-healed laparoscopic scars of the upper abdomen.  No localized signs of infection or dehiscence.  Musculoskeletal:        General: Normal range of motion.     Cervical back: Normal range of motion and neck supple.     Comments: Patient able to sit up on stretcher, without assistance.  She has difficulty raising the left leg off the stretcher but is able to lift them up against gravity.  It appears that they are may be poor effort with this attempt.  She does not appear to be in pain with this effort.  Skin:    General: Skin is warm and dry.  Neurological:     Mental Status: She is alert and oriented to person, place, and time.     Cranial Nerves: No cranial nerve deficit.     Sensory: No sensory deficit.     Motor: No abnormal muscle tone.     Coordination: Coordination normal.  Psychiatric:        Mood and Affect: Mood normal.        Behavior: Behavior normal.        Thought Content: Thought content normal.        Judgment: Judgment normal.    ED Results / Procedures / Treatments   Labs (all labs ordered are listed, but only abnormal results are displayed) Labs Reviewed  COMPREHENSIVE METABOLIC PANEL - Abnormal; Notable for the following components:      Result Value  Sodium 134 (*)    Potassium 3.0 (*)    Glucose, Bld 141 (*)    BUN <5 (*)    Calcium 8.5 (*)    Total  Protein 6.0 (*)    Albumin 2.6 (*)    AST 106 (*)    All other components within normal limits  CBC WITH DIFFERENTIAL/PLATELET - Abnormal; Notable for the following components:   RBC 2.99 (*)    Hemoglobin 9.1 (*)    HCT 28.2 (*)    RDW 19.2 (*)    All other components within normal limits  URINALYSIS, ROUTINE W REFLEX MICROSCOPIC - Abnormal; Notable for the following components:   Color, Urine AMBER (*)    APPearance CLOUDY (*)    Specific Gravity, Urine 1.032 (*)    Bilirubin Urine MODERATE (*)    Ketones, ur 5 (*)    Protein, ur 100 (*)    Nitrite POSITIVE (*)    Leukocytes,Ua MODERATE (*)    WBC, UA >50 (*)    Bacteria, UA RARE (*)    Non Squamous Epithelial 0-5 (*)    All other components within normal limits  RAPID URINE DRUG SCREEN, HOSP PERFORMED - Abnormal; Notable for the following components:   Tetrahydrocannabinol POSITIVE (*)    All other components within normal limits  MAGNESIUM - Abnormal; Notable for the following components:   Magnesium 1.4 (*)    All other components within normal limits  FOLATE - Abnormal; Notable for the following components:   Folate 4.3 (*)    All other components within normal limits  IRON AND TIBC - Abnormal; Notable for the following components:   Iron 6 (*)    TIBC 220 (*)    Saturation Ratios 3 (*)    All other components within normal limits  RETICULOCYTES - Abnormal; Notable for the following components:   RBC. 2.85 (*)    Immature Retic Fract 26.3 (*)    All other components within normal limits  RESP PANEL BY RT-PCR (FLU A&B, COVID) ARPGX2  SARS CORONAVIRUS 2 (TAT 6-24 HRS)  ETHANOL  VITAMIN B12  FERRITIN    EKG None  Radiology No results found.  Procedures Procedures   Medications Ordered in ED Medications  lactated ringers bolus 3,000 mL (0 mLs Intravenous Stopped 08/18/20 1226)  potassium chloride SA (KLOR-CON) CR tablet 40 mEq (40 mEq Oral Given 08/18/20 1246)  magnesium sulfate IVPB 2 g 50 mL (0 g  Intravenous Stopped 08/18/20 1510)    ED Course  I have reviewed the triage vital signs and the nursing notes.  Pertinent labs & imaging results that were available during my care of the patient were reviewed by me and considered in my medical decision making (see chart for details).  Clinical Course as of 08/18/20 1832  Fri Aug 18, 2020  1410 Iron(!): 6 [EW]  1421 Patient was offered admission for treatment, with iron infusion treatments.  She prefers to go home.  She will follow-up with her hematologist on Monday for arrangements for outpatient iron infusion.  We will complete magnesium infusion and discharge. [EW]    Clinical Course User Index [EW] Mancel BaleWentz, Nuel Dejaynes, MD   MDM Rules/Calculators/A&P                            Patient Vitals for the past 24 hrs:  BP Temp Temp src Pulse Resp SpO2 Height Weight  08/18/20 1512 118/65 -- -- (!) 102 16 100 % -- --  08/18/20 1300 111/61 -- -- (!) 104 16 100 % -- --  08/18/20 1234 (!) 108/54 -- -- (!) 105 18 100 % -- --  08/18/20 1201 (!) 108/54 -- -- -- 18 -- -- --  08/18/20 1200 (!) 111/55 -- -- (!) 103 18 100 % -- --  08/18/20 1100 (!) 114/57 -- -- (!) 102 16 99 % -- --  08/18/20 1030 (!) 110/59 -- -- (!) 101 16 100 % -- --  08/18/20 0930 (!) 107/51 -- -- (!) 103 16 100 % -- --  08/18/20 0840 118/66 98 F (36.7 C) Oral (!) 102 18 100 % -- --  08/18/20 0836 -- -- -- -- -- -- 5\' 6"  (1.676 m) 77.1 kg    At time of discharge reevaluation with update and discussion. After initial assessment and treatment, an updated evaluation reveals she is comfortable has no further complaints.  Findings discussed and questions answered.   Medical Decision Making:  This patient is presenting for evaluation of general weakness, which does require a range of treatment options, and is a complaint that involves a high risk of morbidity and mortality. The differential diagnoses include worsening anemia, iron deficiency, acute infection,  metabolic disorder. I decided to review old records, and in summary middle-aged female presenting with nonspecific weakness, and history of iron deficiency requiring blood transfusions and iron infusions.  I did not require additional historical information from anyone.  Clinical Laboratory Tests Ordered, included CBC, Metabolic panel, and anemia panel, COVID test . Review indicates labs returned at time of discharge normal except iron low, TIBC low, saturation ratio low, folate low, RBC low, hemoglobin low, potassium low, sodium low, glucose high, calcium low, total protein low, albumin low, AST high, urinalysis abnormal.   Critical Interventions-clinical evaluation, laboratory testing, medication treatment, IV fluids, observation and reassessment  After These Interventions, the Patient was reevaluated and was found improved, still uncomfortable, but ready to go home.  She was offered admission for further evaluation.  She was treated for hypokalemia and hypomagnesemia as well as suspected volume depletion.  Urinalysis abnormal however she has no urinary tract infections.  This was not a catheter sample.  Expectant management is indicated for that.  Patient prefers to be discharged and follow-up with her hematologist for further care and treatment.  Doubt severe sepsis or impending vascular collapse.  Patient's weakness is likely secondary to iron deficiency which is moderately severe.  She has access to follow-up care and treatment by her hematologist.  CRITICAL CARE-yes Performed by: Mancel Bale  Nursing Notes Reviewed/ Care Coordinated Applicable Imaging Reviewed Interpretation of Laboratory Data incorporated into ED treatment  The patient appears reasonably screened and/or stabilized for discharge and I doubt any other medical condition or other [facility] requiring further screening, evaluation, or treatment in the ED at this time prior to discharge.  Plan: Home Medications-continue usual; Home  Treatments-gradual advance activity; return here if the recommended treatment, does not improve the symptoms; Recommended follow up-PCP, as needed.  Hematology for further care and treatment of anemia, and iron deficiency.     Final Clinical Impression(s) / ED Diagnoses Final diagnoses:  Weakness  Iron deficiency  Hypomagnesemia  Hypokalemia  Anemia, unspecified type    Rx / DC Orders ED Discharge Orders          Ordered    Potassium Chloride ER 20 MEQ TBCR  2 times daily        08/18/20 1521    Magnesium Oxide 200 MG  TABS  2 times daily        08/18/20 1521             Mancel Bale, MD 08/18/20 [phone]

## 2020-08-24 ENCOUNTER — Emergency Department (HOSPITAL_COMMUNITY)
Admission: EM | Admit: 2020-08-24 | Discharge: 2020-08-24 | Disposition: A | Discharge status: Home / Self Care | Payer: Medicaid Other | Attending: Emergency Medicine | Admitting: Emergency Medicine

## 2020-08-24 ENCOUNTER — Encounter (HOSPITAL_COMMUNITY): Payer: Self-pay | Admitting: *Deleted

## 2020-08-24 ENCOUNTER — Emergency Department (HOSPITAL_COMMUNITY): Payer: Medicaid Other

## 2020-08-24 ENCOUNTER — Other Ambulatory Visit: Payer: Self-pay

## 2020-08-24 DIAGNOSIS — F17210 Nicotine dependence, cigarettes, uncomplicated: Secondary | ICD-10-CM | POA: Diagnosis not present

## 2020-08-24 DIAGNOSIS — R531 Weakness: Secondary | ICD-10-CM | POA: Insufficient documentation

## 2020-08-24 DIAGNOSIS — R260 Ataxic gait: Secondary | ICD-10-CM | POA: Diagnosis not present

## 2020-08-24 DIAGNOSIS — R600 Localized edema: Secondary | ICD-10-CM | POA: Diagnosis not present

## 2020-08-24 DIAGNOSIS — N3000 Acute cystitis without hematuria: Secondary | ICD-10-CM

## 2020-08-24 DIAGNOSIS — R109 Unspecified abdominal pain: Secondary | ICD-10-CM | POA: Insufficient documentation

## 2020-08-24 LAB — CBC
HCT: 27.4 % — ABNORMAL LOW (ref 36.0–46.0)
Hemoglobin: 8.5 g/dL — ABNORMAL LOW (ref 12.0–15.0)
MCH: 30 pg (ref 26.0–34.0)
MCHC: 31 g/dL (ref 30.0–36.0)
MCV: 96.8 fL (ref 80.0–100.0)
Platelets: 435 10*3/uL — ABNORMAL HIGH (ref 150–400)
RBC: 2.83 MIL/uL — ABNORMAL LOW (ref 3.87–5.11)
RDW: 18.7 % — ABNORMAL HIGH (ref 11.5–15.5)
WBC: 6.2 10*3/uL (ref 4.0–10.5)
nRBC: 0 % (ref 0.0–0.2)

## 2020-08-24 LAB — BASIC METABOLIC PANEL
Anion gap: 8 (ref 5–15)
BUN: 7 mg/dL (ref 6–20)
CO2: 25 mmol/L (ref 22–32)
Calcium: 8.6 mg/dL — ABNORMAL LOW (ref 8.9–10.3)
Chloride: 104 mmol/L (ref 98–111)
Creatinine, Ser: 0.43 mg/dL — ABNORMAL LOW (ref 0.44–1.00)
GFR, Estimated: >60 mL/min (ref >60)
Glucose, Bld: 127 mg/dL — ABNORMAL HIGH (ref 70–99)
Potassium: 3.8 mmol/L (ref 3.5–5.1)
Sodium: 137 mmol/L (ref 135–145)

## 2020-08-24 LAB — URINALYSIS, ROUTINE W REFLEX MICROSCOPIC
Bilirubin Urine: NEGATIVE
Glucose, UA: NEGATIVE mg/dL
Hgb urine dipstick: NEGATIVE
Ketones, ur: NEGATIVE mg/dL
Nitrite: POSITIVE — AB
Protein, ur: 30 mg/dL — AB
Specific Gravity, Urine: 1.025 (ref 1.005–1.030)
pH: 5 (ref 5.0–8.0)

## 2020-08-24 LAB — HEPATIC FUNCTION PANEL
ALT: 36 U/L (ref 0–44)
AST: 128 U/L — ABNORMAL HIGH (ref 15–41)
Albumin: 2.7 g/dL — ABNORMAL LOW (ref 3.5–5.0)
Alkaline Phosphatase: 183 U/L — ABNORMAL HIGH (ref 38–126)
Bilirubin, Direct: 0.3 mg/dL — ABNORMAL HIGH (ref 0.0–0.2)
Indirect Bilirubin: 0.6 mg/dL (ref 0.3–0.9)
Total Bilirubin: 0.9 mg/dL (ref 0.3–1.2)
Total Protein: 6.7 g/dL (ref 6.5–8.1)

## 2020-08-24 LAB — TROPONIN I (HIGH SENSITIVITY)
Troponin I (High Sensitivity): 4 ng/L (ref <18)
Troponin I (High Sensitivity): 4 ng/L (ref <18)

## 2020-08-24 LAB — CK: Total CK: 18 U/L — ABNORMAL LOW (ref 38–234)

## 2020-08-24 LAB — MAGNESIUM: Magnesium: 1.9 mg/dL (ref 1.7–2.4)

## 2020-08-24 LAB — D-DIMER, QUANTITATIVE: D-Dimer, Quant: 0.86 ug/mL-FEU — ABNORMAL HIGH (ref 0.00–0.50)

## 2020-08-24 LAB — VITAMIN B12: Vitamin B-12: 257 pg/mL (ref 180–914)

## 2020-08-24 LAB — BRAIN NATRIURETIC PEPTIDE: B Natriuretic Peptide: 43 pg/mL (ref 0.0–100.0)

## 2020-08-24 IMAGING — US US EXTREM LOW VENOUS*R*
1 series · 13 of 24 positions shown · non-contrast
Comparison: None.

CLINICAL DATA: Right lower extremity pain and edema. History of DVT
and pulmonary embolism. Evaluate for acute or chronic DVT.



[Series 1: us extrem low venous*right* · 0.05mm/px · 13 of 39 slices shown]
[im 1/39]
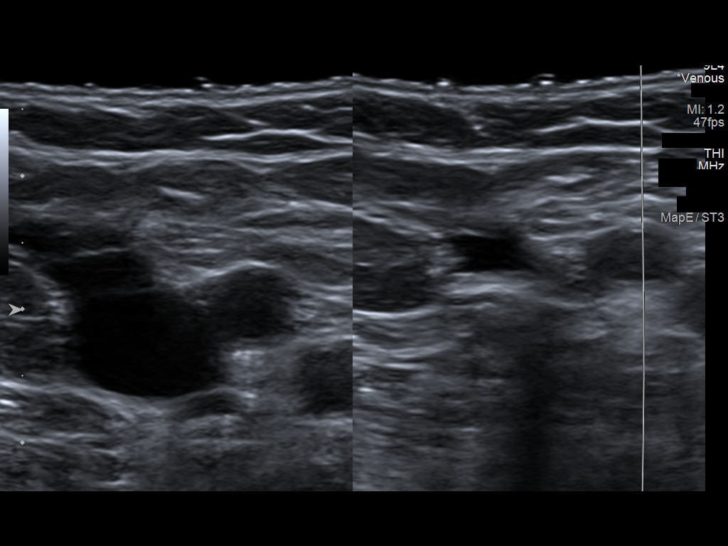
[im 4/39]
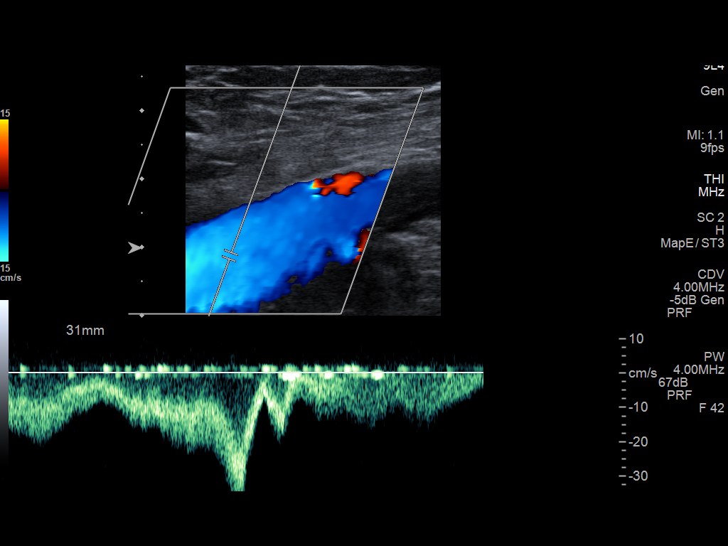
[im 7/39]
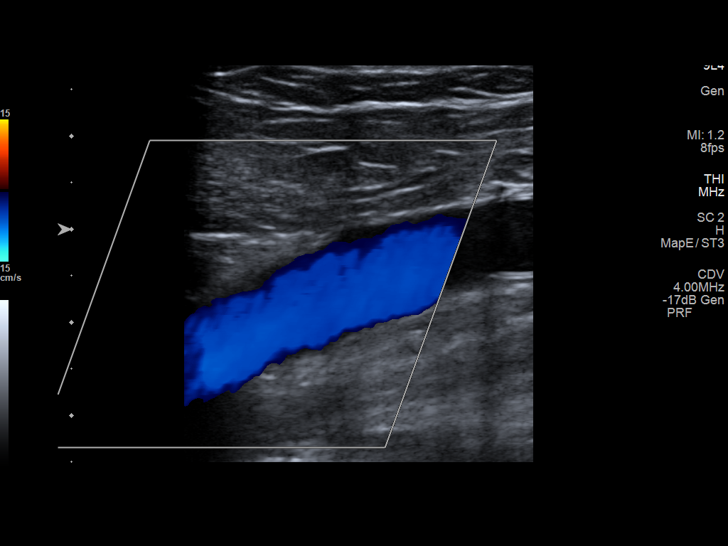
[im 10/39]
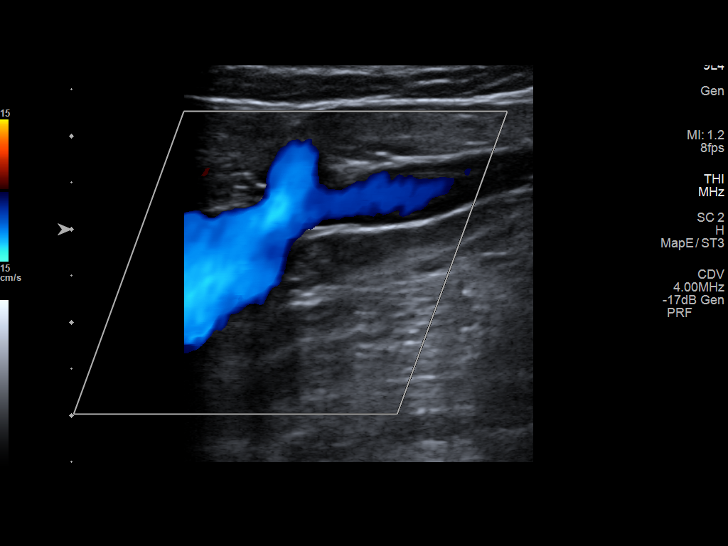
[im 14/39]
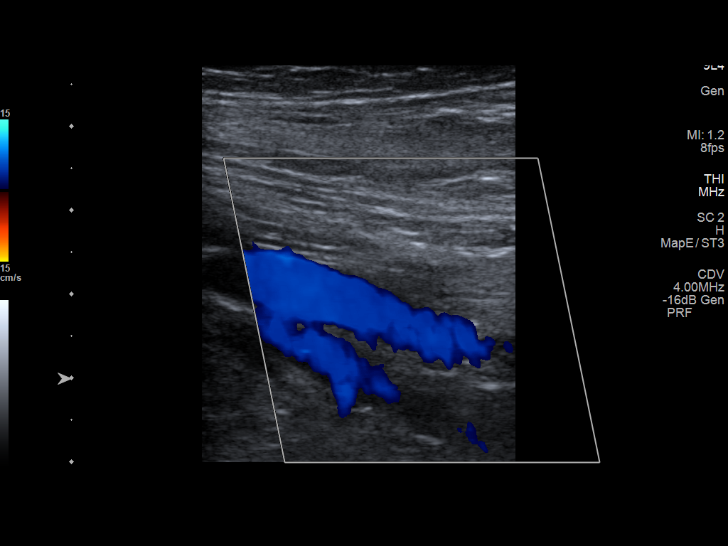
[im 17/39]
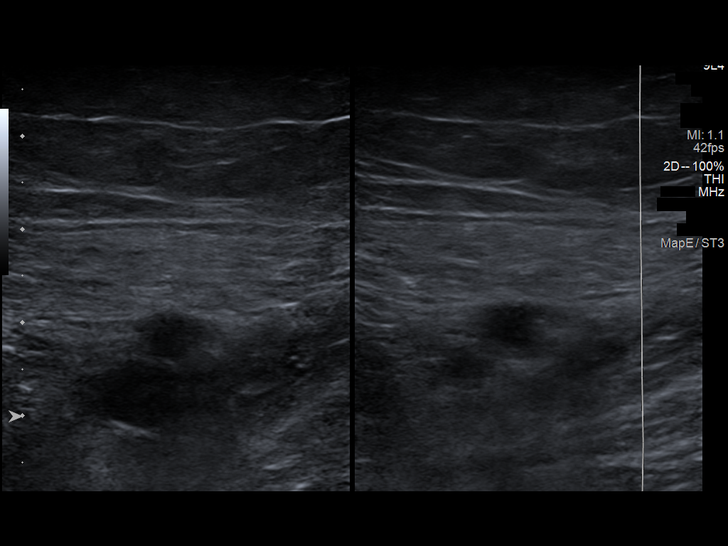
[im 20/39]
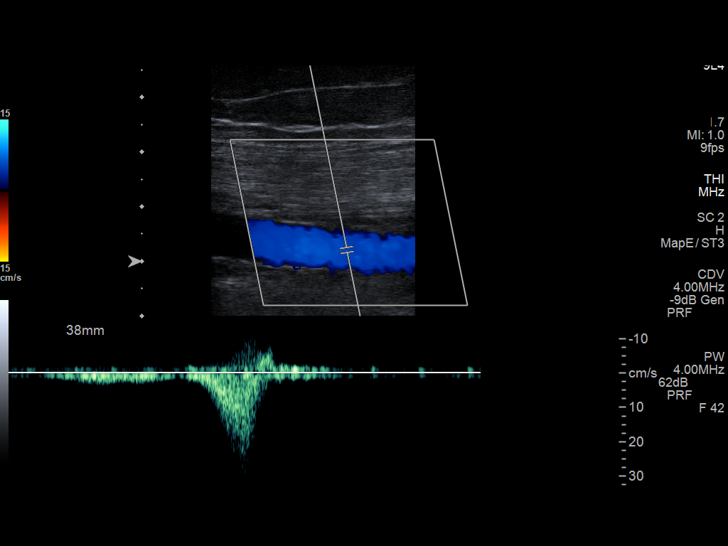
[im 22/39]
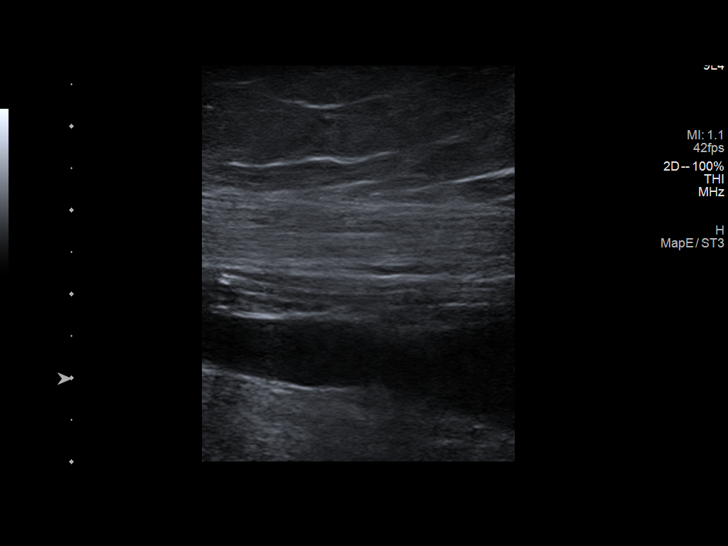
[im 25/39]
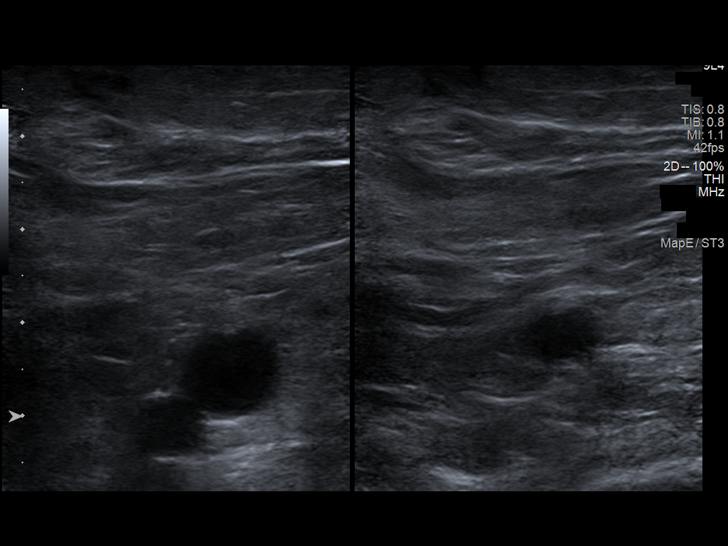
[im 29/39]
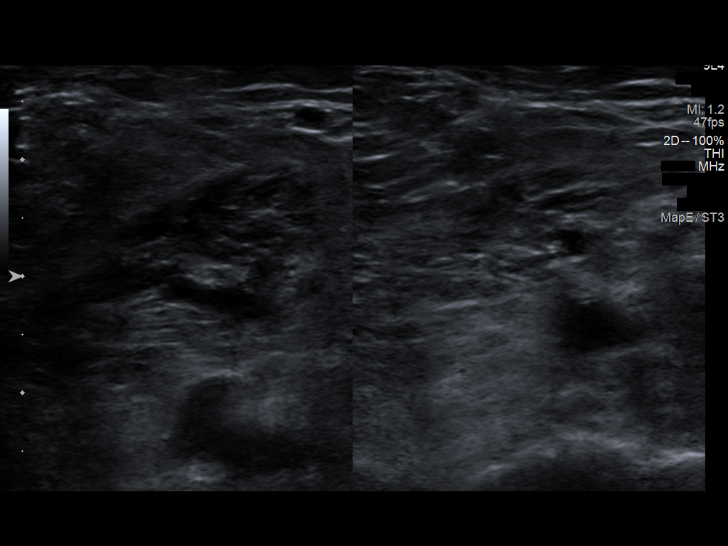
[im 32/39]
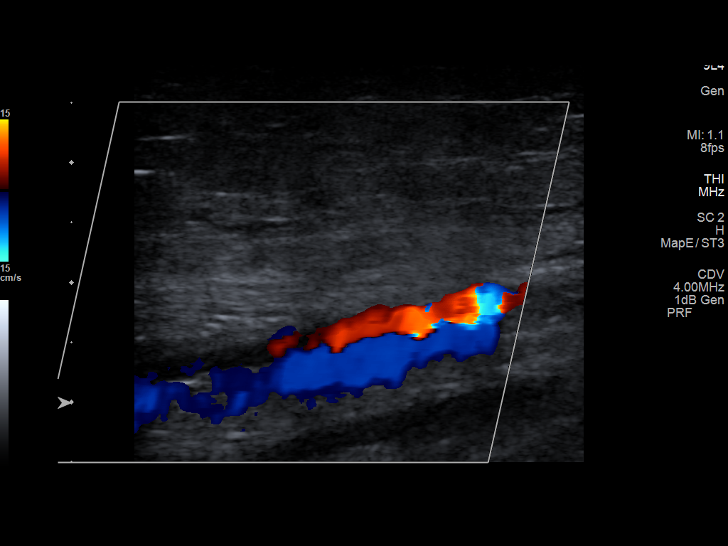
[im 35/39]
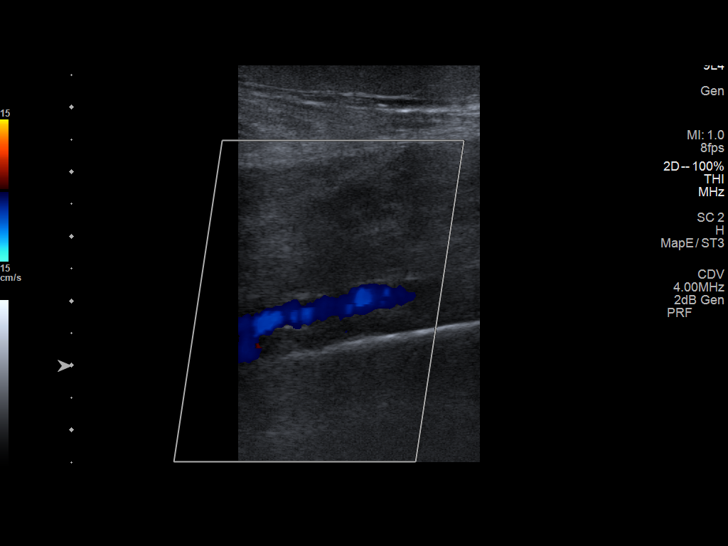
[im 39/39]
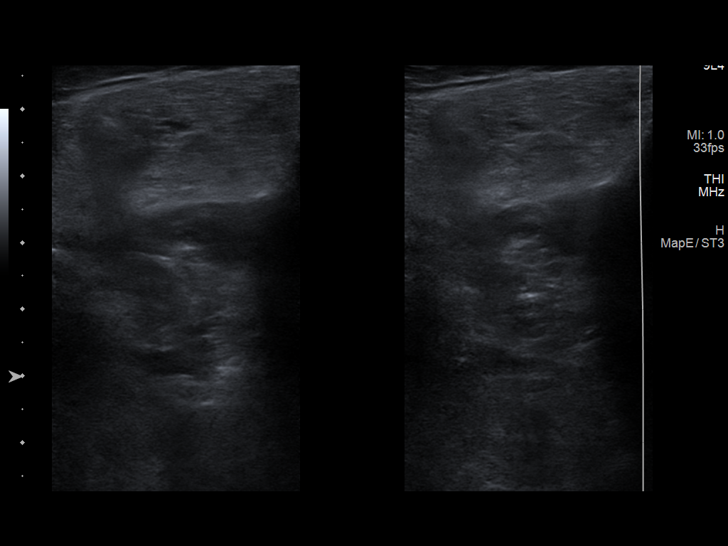

[13 of 24 positions shown; findings below may reference images not displayed]

FINDINGS: Contralateral Common Femoral Vein: Respiratory phasicity is normal
and symmetric with the symptomatic side. No evidence of thrombus.
Normal compressibility.

Common Femoral Vein: No evidence of thrombus. Normal
compressibility, respiratory phasicity and response to augmentation.

Saphenofemoral Junction: No evidence of thrombus. Normal
compressibility and flow on color Doppler imaging.

Profunda Femoral Vein: No evidence of thrombus. Normal
compressibility and flow on color Doppler imaging.

Femoral Vein: No evidence of thrombus. Normal compressibility,
respiratory phasicity and response to augmentation.

Popliteal Vein: No evidence of thrombus. Normal compressibility,
respiratory phasicity and response to augmentation.

Calf Veins: No evidence of thrombus. Normal compressibility and flow
on color Doppler imaging.

Superficial Great Saphenous Vein: No evidence of thrombus. Normal
compressibility.

Venous Reflux:  None.

Other Findings: There is a minimal amount of subcutaneous edema at
the level of the right calf.
IMPRESSION: No evidence of acute or chronic DVT within the right lower
extremity.

## 2020-08-24 IMAGING — MR MR HEAD W/O CM
11 of 12 series · 40 of 48 positions shown · non-contrast
Comparison: Maxillofacial CT [DATE].

CLINICAL DATA: Ataxia, nontraumatic, stroke excluded. Additional
history provided: Patient reports generalized weakness. Patient
reports laparoscopic cholecystectomy 1.5 weeks ago, weakness since
that time, weakness became severe yesterday.

EXAM:
MRI HEAD WITHOUT CONTRAST
TECHNIQUE: Multiplanar, multiecho pulse sequences of the brain and surrounding
structures were obtained without intravenous contrast.

[Series 5: DWI · axial · 4.0mm · 0.88mm/px · z∈[-104,+34]mm · 4 of 36 slices shown (1 of 6)]
[im 1/36]
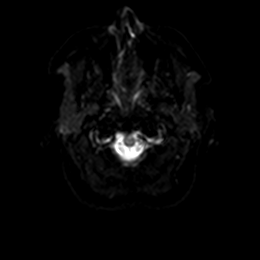
[im 12/36]
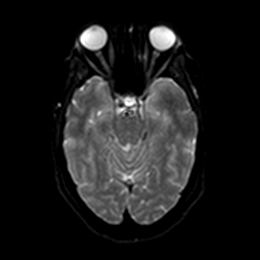
[im 24/36]
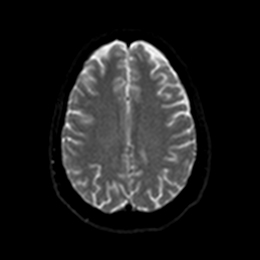
[im 36/36]
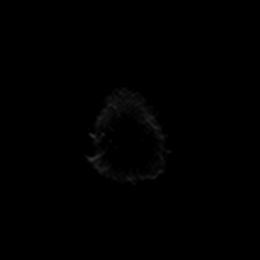

[Series 5: DWI · axial · 4.0mm · 0.88mm/px · z∈[-104,+34]mm · 4 of 36 slices shown (2 of 6)]
[im 1/36]
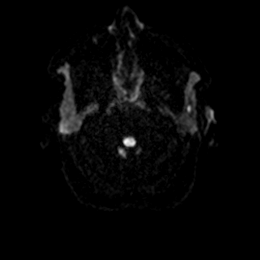
[im 12/36]
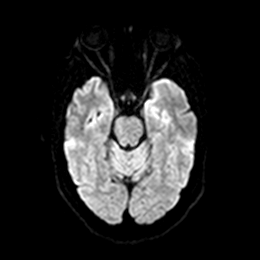
[im 24/36]
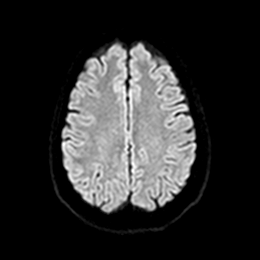
[im 36/36]
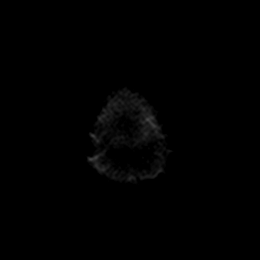

[Series 6: DWI · axial · 4.0mm · 0.88mm/px · z∈[-104,+34]mm · 4 of 36 slices shown (3 of 6)]
[im 1/36]
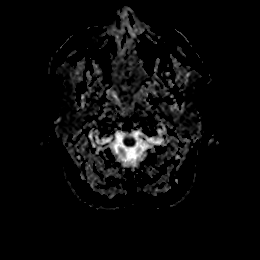
[im 12/36]
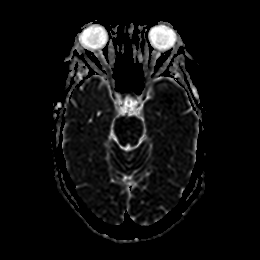
[im 24/36]
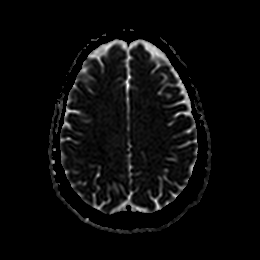
[im 36/36]
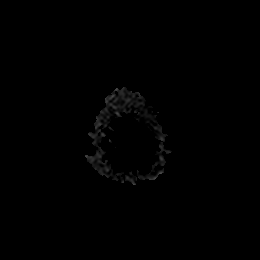

[Series 7: DWI · coronal · 5.0mm · 0.88mm/px · 3 of 28 slices shown (4 of 6)]
[im 1/28]
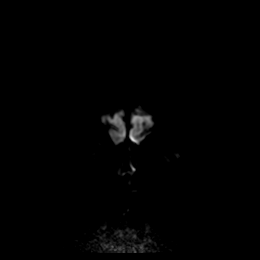
[im 14/28]
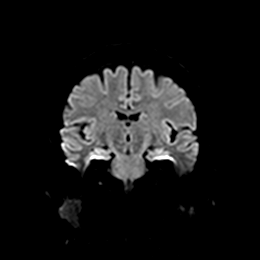
[im 28/28]
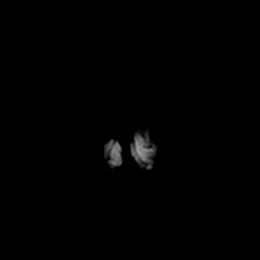

[Series 7: DWI · coronal · 5.0mm · 0.88mm/px · 4 of 28 slices shown (5 of 6)]
[im 1/28]
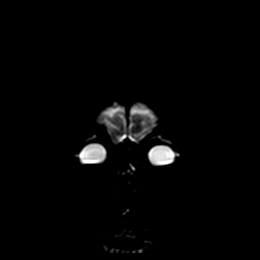
[im 10/28]
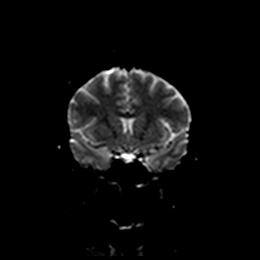
[im 19/28]
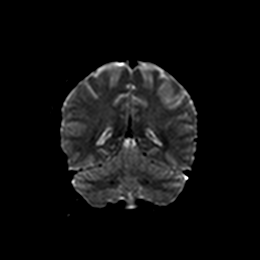
[im 28/28]
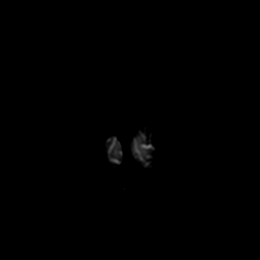

[Series 8: DWI · coronal · 5.0mm · 0.88mm/px · 4 of 28 slices shown (6 of 6)]
[im 1/28]
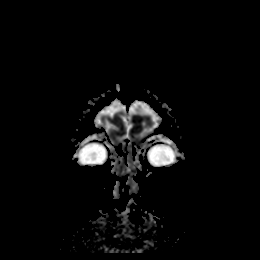
[im 10/28]
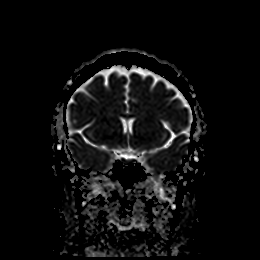
[im 19/28]
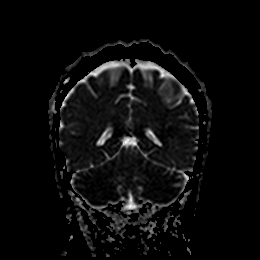
[im 28/28]
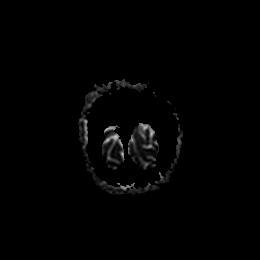

[Series 9: T1 · sagittal · 5.0mm · 0.94mm/px · 3 of 25 slices shown]
[im 1/25]
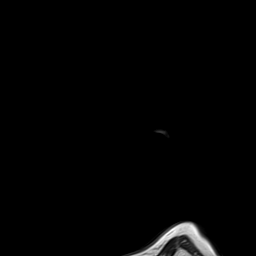
[im 13/25]
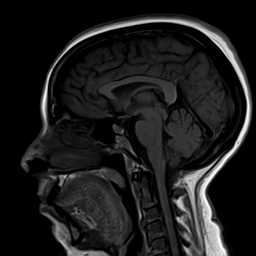
[im 25/25]
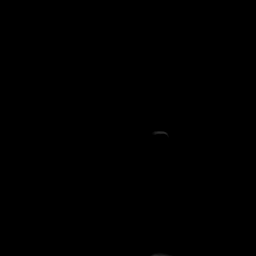

[Series 10: T2 · axial · 5.0mm · 0.72mm/px · z∈[-100,+31]mm · 3 of 20 slices shown (1 of 2)]
[im 1/20]
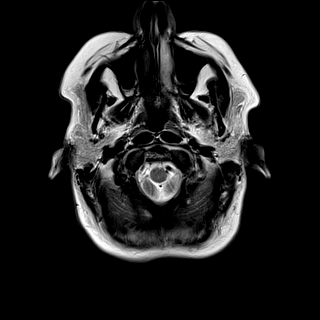
[im 10/20]
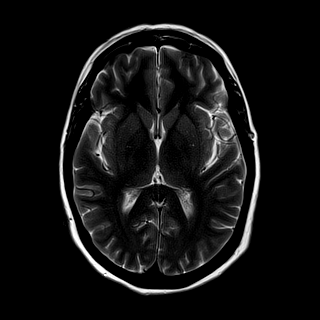
[im 20/20]
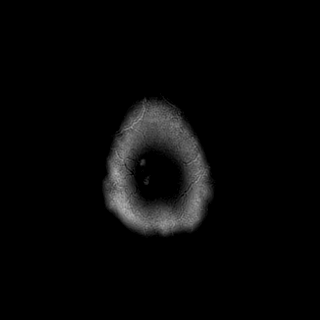

[Series 11: ax hemo · axial · 5.0mm · 0.86mm/px · z∈[-103,+39]mm · 3 of 25 slices shown]
[im 1/25]
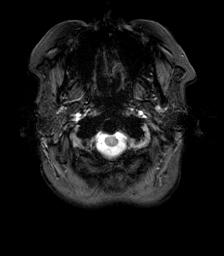
[im 13/25]
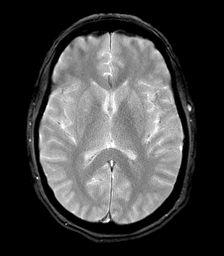
[im 25/25]
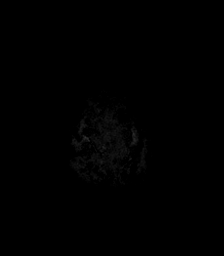

[Series 12: FLAIR · axial · 4.0mm · 0.43mm/px · z∈[-93,+29]mm · 4 of 32 slices shown]
[im 1/32]
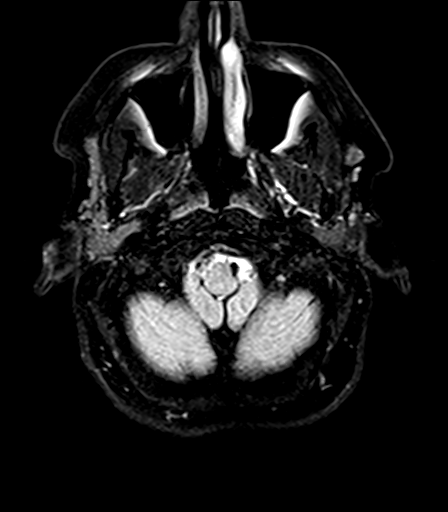
[im 11/32]
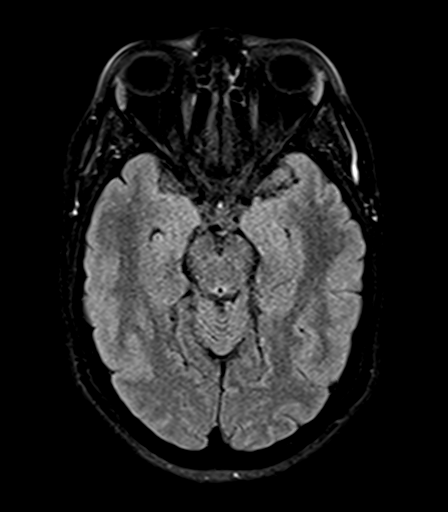
[im 21/32]
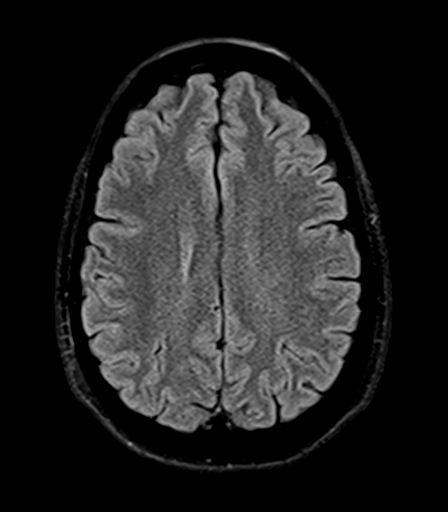
[im 32/32]
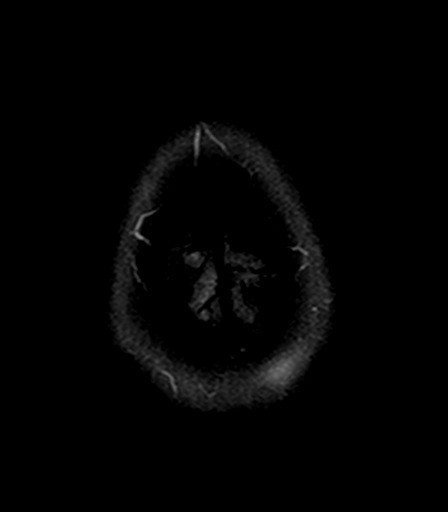

[Series 14: T2 · coronal · 5.0mm · 0.72mm/px · 4 of 28 slices shown (2 of 2)]
[im 1/28]
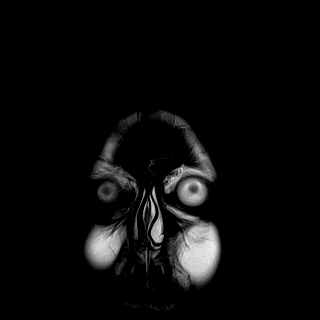
[im 10/28]
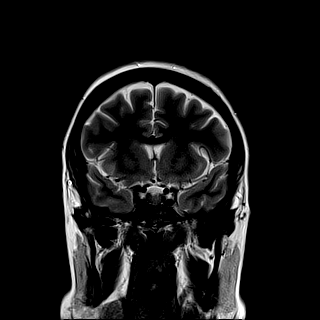
[im 19/28]
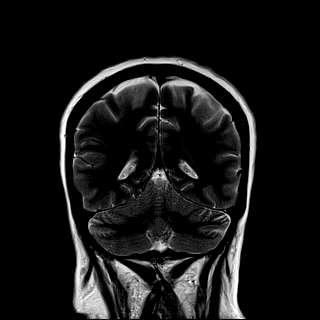
[im 28/28]
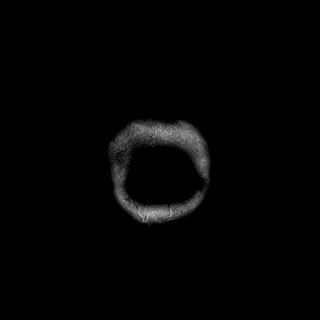

[40 of 48 positions shown; findings below may reference images not displayed]

FINDINGS: Brain:

Mild intermittent motion degradation.

Cerebral volume is normal.

No cortical encephalomalacia is identified. No significant cerebral
white matter disease.

There is no acute infarct.

No evidence of an intracranial mass.

No chronic intracranial blood products.

No extra-axial fluid collection.

No midline shift.

Vascular: Expected proximal arterial flow voids.

Skull and upper cervical spine: No focal marrow lesion. T1
hypointense marrow signal within the calvarium and visualized
cervical spine.

Sinuses/Orbits: Visualized orbits show no acute finding. Chronic
fracture deformity of the left orbital floor. No significant
paranasal sinus disease.
IMPRESSION: Unremarkable non-contrast MRI appearance of the brain. No evidence
of acute intracranial abnormality.

T1 hypointense marrow signal within the calvarium and visualized
cervical spine. While this finding can reflect a marrow infiltrative
process, the most common causes include chronic anemia, smoking and
obesity.

## 2020-08-24 MED ORDER — OXYCODONE-ACETAMINOPHEN 5-325 MG PO TABS
1.0000 | ORAL_TABLET | Freq: Once | ORAL | Status: AC
  Administered 2020-08-24: 1 via ORAL
  Filled 2020-08-24: qty 1

## 2020-08-24 MED ORDER — SODIUM CHLORIDE 0.9 % IV SOLN
1.0000 g | Freq: Once | INTRAVENOUS | Status: AC
  Administered 2020-08-24: 1 g via INTRAVENOUS
  Filled 2020-08-24: qty 10

## 2020-08-24 MED ORDER — CEPHALEXIN 500 MG PO CAPS: 500.0000 mg | ORAL_CAPSULE | Freq: Four times a day (QID) | ORAL | 0 refills | Status: DC

## 2020-08-24 MED ORDER — THIAMINE HCL 100 MG/ML IJ SOLN
100.0000 mg | Freq: Every day | INTRAMUSCULAR | Status: DC
  Administered 2020-08-24: 100 mg via INTRAVENOUS
  Filled 2020-08-24: qty 2

## 2020-08-24 MED ORDER — SODIUM CHLORIDE 0.9 % IV BOLUS
1000.0000 mL | Freq: Once | INTRAVENOUS | Status: AC
  Administered 2020-08-24: 1000 mL via INTRAVENOUS

## 2020-08-24 NOTE — Discharge Instructions (Addendum)
You have a UTI, the first dose of antibiotics was given in the ED.  Please take Keflex 4 times daily for the next 5 days.  This will clear it up.  Continue taking all of your other medications as prescribed.  I also recommend increasing the amount of protein in your diet as you did have some low protein in the ED visit.  As we discussed, this is likely due to to being weak in general.  You did have swelling in your legs.  Please use compression stockings, you can order these on [employer].  I also recommend ambulating throughout the day to help prevent fluid from building up.  Please schedule a follow-up appointment with your primary care doctor for next week.  I suspect you will need an echocardiogram done to evaluate your heart for possible alternative cause of the increase in swelling in your legs.  Please schedule an appointment for neurologic follow up.  They should be calling you to set up an appointment, but if you do not hear from them I want you to call them tomorrow morning just to make sure you have an appointment.  If things worsen, change, please return back to the ED for further evaluation as we discussed.  Home health will be calling you to set up time to visit your home for physical therapy. Please answer their call when you receive it.

## 2020-08-24 NOTE — ED Provider Notes (Signed)
[facility] EMERGENCY DEPARTMENT Provider Note   CSN: [id number] Arrival date & time: 08/24/20  1228     History No chief complaint on file.   Erica Banks is a 40 y.o. female.  HPI  Patient presents with weakness.  Weakness has been ongoing for 3 weeks, has been relatively constant.  Has been progressing recently including having issues getting off the toilet without assistance, having to walk with a walker, having difficulty use raising her self from bed.  She feels unsteady on her feet, she also endorses body aches that are significant and worse to her arms.  She was seen last week in the emergency department for and given fluids as well as electrolyte correction for hypomagnesia and hypokalemia.  She had her gallbladder removed 3 weeks ago and the surgery was apparently successful, but she has been feeling weak ever since.  She is not having any specific abdominal pain, more just endorsing generalized weakness since the procedure.  She also endorses swelling in her legs bilaterally, although it is worse on the right.  She is not having any chest pain or shortness of breath.  Past Medical History:  Diagnosis Date   DVT (deep venous thrombosis) (HCC)    Low iron    Panic attacks    Pulmonary embolus [facility])     Patient Active Problem List   Diagnosis Date Noted   Intractable nausea and vomiting 07/20/2019   Cholelithiasis 07/20/2019   Prolonged QT interval 07/20/2019   Anemia 01/01/2015   Symptomatic anemia 01/01/2015   Hypokalemia 01/01/2015   Acute gastroenteritis 01/01/2015   Abnormal CT of the abdomen 06/08/2013   GERD (gastroesophageal reflux disease) 06/08/2013    Past Surgical History:  Procedure Laterality Date   ABDOMINAL HYSTERECTOMY     BIOPSY N/A 06/24/2013   Procedure: BIOPSY TERMINAL ILEUM;  Surgeon: Corbin Ade, MD;  Location: [facility];  Service: Endoscopy;  Laterality: N/A;   CHOLECYSTECTOMY     COLONOSCOPY N/A 06/24/2013   [id number]  hemorrhoids/otherwise normal    nasal bone surgery     NASAL SINUS SURGERY     SINUS IRRIGATION     TONSILLECTOMY     TUBAL LIGATION       OB History     Gravida  2   Para  2   Term  2   Preterm      AB      Living  2      SAB      IAB      Ectopic      Multiple      Live Births              Family History  Problem Relation Age of Onset   Diabetes Mother    Hypertension Mother    COPD Mother    Diabetes Sister    Hypertension Sister    Cancer Other    Crohn's disease Maternal Grandmother    Colon cancer Neg Hx     Social History   Tobacco Use   Smoking status: Every Day    Packs/day: 1.00    Years: 12.00    Pack years: 12.00    Types: Cigarettes   Smokeless tobacco: Never  Vaping Use   Vaping Use: Never used  Substance Use Topics   Alcohol use: Yes    Alcohol/week: 4.0 - 5.0 standard drinks    Types: 4 - 5 Cans of beer per week    Comment:  occ   Drug use: No    Home Medications Prior to Admission medications   Medication Sig Start Date End Date Taking? Authorizing Provider  cyclobenzaprine (FLEXERIL) 10 MG tablet Take 1 tablet (10 mg total) by mouth 3 (three) times daily. Patient not taking: No sig reported 07/20/19   Shon HaleEmokpae, Courage, MD  Magnesium Oxide 200 MG TABS Take 1 tablet (200 mg total) by mouth 2 (two) times daily. 08/18/20   Mancel BaleWentz, Elliott, MD  ondansetron (ZOFRAN ODT) 4 MG disintegrating tablet Take 1 tablet (4 mg total) by mouth every 8 (eight) hours as needed for nausea or vomiting. Patient not taking: No sig reported 07/20/19   Shon HaleEmokpae, Courage, MD  pantoprazole (PROTONIX) 40 MG tablet Take 1 tablet (40 mg total) by mouth 2 (two) times daily before a meal. Patient taking differently: Take 40 mg by mouth daily. 07/20/19   Shon HaleEmokpae, Courage, MD  Potassium Chloride ER 20 MEQ TBCR Take 20 mEq by mouth 2 (two) times daily. 08/18/20   Mancel BaleWentz, Elliott, MD    Allergies    Demerol  Review of Systems   Review of Systems   Constitutional:  Negative for chills and fever.  HENT:  Negative for ear pain and sore throat.   Eyes:  Negative for pain and visual disturbance.  Respiratory:  Negative for cough, chest tightness and shortness of breath.   Cardiovascular:  Negative for chest pain and palpitations.  Gastrointestinal:  Positive for constipation. Negative for abdominal pain, diarrhea and vomiting.  Genitourinary:  Negative for dysuria and hematuria.  Musculoskeletal:  Positive for arthralgias and myalgias. Negative for back pain.  Skin:  Negative for color change and rash.  Neurological:  Positive for weakness and numbness. Negative for dizziness, seizures and syncope.  All other systems reviewed and are negative.  Physical Exam Updated Vital Signs BP (!) 121/92   Pulse (!) 105   Temp 97.8 F (36.6 C) (Oral)   Resp 20   Ht 5\' 6"  (1.676 m)   Wt 90 kg   LMP  (LMP Unknown)   SpO2 96%   BMI 32.02 kg/m   Physical Exam Vitals and nursing note reviewed. Exam conducted with a chaperone present.  Constitutional:      Appearance: Normal appearance.     Comments: Patient is tearful  HENT:     Head: Normocephalic and atraumatic.  Eyes:     General: No scleral icterus.       Right eye: No discharge.        Left eye: No discharge.     Extraocular Movements: Extraocular movements intact.     Pupils: Pupils are equal, round, and reactive to light.  Cardiovascular:     Rate and Rhythm: Normal rate and regular rhythm.     Pulses: Normal pulses.     Heart sounds: Normal heart sounds. No murmur heard.   No friction rub. No gallop.  Pulmonary:     Effort: Pulmonary effort is normal. No respiratory distress.     Breath sounds: Normal breath sounds.  Abdominal:     General: Abdomen is flat. Bowel sounds are normal. There is no distension.     Palpations: Abdomen is soft.     Tenderness: There is no abdominal tenderness.     Comments: No abdominal tenderness, surgical incisions appear well no surrounding  erythema or signs of dehiscence  Musculoskeletal:     Right lower leg: Edema present.     Left lower leg: Edema present.  Comments: Patient has 2+ pitting edema up to the knee.  Legs are roughly symmetric, she did have some tenderness to the posterior calf palpation to the right.  She does have objective weakness to the lower extremities, 3/5.  She is able to ambulate and raise, although there is some weakness.  Her upper extremities are strong (4/5), no significant differences between the right and light left sides or UE or LE  Skin:    General: Skin is warm and dry.     Coloration: Skin is not jaundiced.  Neurological:     Mental Status: She is alert. Mental status is at baseline.     Cranial Nerves: No cranial nerve deficit.     Sensory: No sensory deficit.     Motor: Weakness present.     Coordination: Coordination normal.     Comments: Cranial nerves III through XII are grossly intact.  Grip strength is equal bilaterally.  Lower extremity strength is weaker, although she has sensation grossly intact to both.  Lower extremity reflexes patellar and plantar are 1+.  Upper extremity weak reflexes are 2+ to the triceps and brachioradialis.    ED Results / Procedures / Treatments   Labs (all labs ordered are listed, but only abnormal results are displayed) Labs Reviewed  BASIC METABOLIC PANEL - Abnormal; Notable for the following components:      Result Value   Glucose, Bld 127 (*)    Creatinine, Ser 0.43 (*)    Calcium 8.6 (*)    All other components within normal limits  CBC - Abnormal; Notable for the following components:   RBC 2.83 (*)    Hemoglobin 8.5 (*)    HCT 27.4 (*)    RDW 18.7 (*)    Platelets 435 (*)    All other components within normal limits  URINALYSIS, ROUTINE W REFLEX MICROSCOPIC - Abnormal; Notable for the following components:   Color, Urine AMBER (*)    APPearance HAZY (*)    Protein, ur 30 (*)    Nitrite POSITIVE (*)    Leukocytes,Ua SMALL (*)     Bacteria, UA MANY (*)    All other components within normal limits  D-DIMER, QUANTITATIVE - Abnormal; Notable for the following components:   D-Dimer, Quant 0.86 (*)    All other components within normal limits  CK - Abnormal; Notable for the following components:   Total CK 18 (*)    All other components within normal limits  HEPATIC FUNCTION PANEL - Abnormal; Notable for the following components:   Albumin 2.7 (*)    AST 128 (*)    Alkaline Phosphatase 183 (*)    Bilirubin, Direct 0.3 (*)    All other components within normal limits  MAGNESIUM  BRAIN NATRIURETIC PEPTIDE  CBG MONITORING, ED  POC URINE PREG, ED  TROPONIN I (HIGH SENSITIVITY)    EKG None  Radiology US Venous Img Lower Unilateral Right  Result Date: 08/24/2020 CLINICAL DATA:  Right lower extremity pain and edema. History of DVT and pulmonary embolism. Evaluate for acute or chronic DVT. EXAM: RIGHT LOWER EXTREMITY VENOUS DOPPLER ULTRASOUND TECHNIQUE: Gray-scale sonography with graded compression, as well as color Doppler and duplex ultrasound were performed to evaluate the lower extremity deep venous systems from the level of the common femoral vein and including the common femoral, femoral, profunda femoral, popliteal and calf veins including the posterior tibial, peroneal and gastrocnemius veins when visible. The superficial great saphenous vein was also interrogated. Spectral Doppler was utilized to  evaluate flow at rest and with distal augmentation maneuvers in the common femoral, femoral and popliteal veins. COMPARISON:  None. FINDINGS: Contralateral Common Femoral Vein: Respiratory phasicity is normal and symmetric with the symptomatic side. No evidence of thrombus. Normal compressibility. Common Femoral Vein: No evidence of thrombus. Normal compressibility, respiratory phasicity and response to augmentation. Saphenofemoral Junction: No evidence of thrombus. Normal compressibility and flow on color Doppler imaging.  Profunda Femoral Vein: No evidence of thrombus. Normal compressibility and flow on color Doppler imaging. Femoral Vein: No evidence of thrombus. Normal compressibility, respiratory phasicity and response to augmentation. Popliteal Vein: No evidence of thrombus. Normal compressibility, respiratory phasicity and response to augmentation. Calf Veins: No evidence of thrombus. Normal compressibility and flow on color Doppler imaging. Superficial Great Saphenous Vein: No evidence of thrombus. Normal compressibility. Venous Reflux:  None. Other Findings: There is a minimal amount of subcutaneous edema at the level of the right calf. IMPRESSION: No evidence of acute or chronic DVT within the right lower extremity. Electronically Signed   By: Simonne Come M.D.   On: 08/24/2020 14:55    Procedures Procedures   Medications Ordered in ED Medications  oxyCODONE-acetaminophen (PERCOCET/ROXICET) 5-325 MG per tablet 1 tablet (has no administration in time range)  sodium chloride 0.9 % bolus 1,000 mL (has no administration in time range)    ED Course  I have reviewed the triage vital signs and the nursing notes.  Pertinent labs & imaging results that were available during my care of the patient were reviewed by me and considered in my medical decision making (see chart for details).  Clinical Course as of 08/24/20 1827  Thu Aug 24, 2020  1723 Urinalysis, Routine w reflex microscopic Urine, Clean Catch(!) Urine is notable for a urinary tract infection. [HS]  1723 MR BRAIN WO CONTRAST Does not explain sudden onset of ataxia, overall reassuring. [HS]  1724 Hemoglobin(!): 8.5 Patient is anemic, but not grossly decreased from her baseline or from 6 days ago.  She does not report any bloody stools or black tarry stool.  There is no abdominal pain on exam. [HS]  1748 Hepatic function panel(!) Low albumin, similar to 6 days ago.  [HS]  1749 Troponin I (High Sensitivity) Doubt ACS [HS]  1749 CK(!) Doubt Rhabdo  [HS]  1749 Basic metabolic panel(!) No electrolyte derangement  [HS]  1749 CBC(!) No leukocytosis, anemic but not grossly changed from recent baseline [HS]    Clinical Course User Index [HS] Theron Arista, PA-C   MDM Rules/Calculators/A&P                           Please see ED course for some interpretation of labs and imaging.  Ordered an MRI to assess for possible causes of the weakness and ataxia.  That was not revealing.  Ordered DVT study to assess for possible DVT given she has a history and the D-dimer was elevated.  Suspect the D-dimer elevation was due to surgery and the DVT study was negative.  I do not suspect PE given there is no chest pain or shortness of breath.  Do not think a CTA is needed at this time.  I am somewhat perplexed by her physical exam, specifically the weakness neurologically without explanation for the MRI.  I will consult with neurology and see if they have any recommendations.    She does have a UTI which we are treating with ceftriaxone outpatient Keflex.  We will do home health  assistance for general deconditioning from the surgery.    Suspect she will need physical therapy and assistance to regain strength.  Her protein is low we discussed dietary changes.  Decided in an ambulatory referral for home health aide.  Hopefully physical therapy will help her regain her strength, I suspect a lot of her weakness is due to decompensation from the procedure and laying around in bed.  She is somewhat anemic, advised taking iron supplements as she is already continuing to do.  Do not think this requires blood transfusions at this time, although it may be contributing to her weakness somewhat. Doubt GIB, suspect due to recent surgery. She was also hemoconcentrated last ED visit so I suspect this may contribute to the decrease from last week.   I consulted with S. 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  She also advised getting an MRI of the C-spine and T-spine as well as checking thiamine level and B12.  We discussed that MRI is not to be an option much longer [place], she is unsure if the patient needs to be admitted for this given that this is a curbside consult.  I will discuss with my attending and the patient about possible admission given MRI is not available at this time.    Engaged in shared decision-making with the patient.  She is adamant she does not want to be admitted to the hospital.  We discussed risks and benefits of staying versus leaving.  Patient is aware of the risk and verbalized understanding of strict return precautions.  I have ordered B12 and B1, and of the thiamine lab takes longer to result so we will be available hopefully by her neurology follow-up appointment.  I have given her an ambulatory referral as well as instructions to call and follow-up with them.  Very strict return precautions were discussed with the patient who verbalized understanding.  Patient will be discharged with strict return precautions.  Discussed HPI, physical exam and plan of care for this patient with attending Alona Bene. The attending physician evaluated this patient as part of a shared visit and agrees with plan of care.    Final Clinical Impression(s) / ED Diagnoses Final diagnoses:  None    Rx / DC Orders ED Discharge Orders     None        Theron Arista, Cordelia Poche 08/24/20 Tora Perches, MD 08/28/20 1257

## 2020-08-28 LAB — POC URINE PREG, ED: Preg Test, Ur: NEGATIVE

## 2020-08-29 LAB — VITAMIN B1: Vitamin B1 (Thiamine): 78 nmol/L (ref 66.5–200.0)

## 2020-09-14 ENCOUNTER — Encounter: Payer: Self-pay | Admitting: Neurology

## 2020-09-15 DIAGNOSIS — Z7189 Other specified counseling: Secondary | ICD-10-CM | POA: Insufficient documentation

## 2020-09-15 DIAGNOSIS — G621 Alcoholic polyneuropathy: Secondary | ICD-10-CM | POA: Insufficient documentation

## 2020-11-13 ENCOUNTER — Ambulatory Visit: Payer: Medicaid Other | Admitting: Neurology

## 2021-03-15 ENCOUNTER — Other Ambulatory Visit: Payer: Self-pay | Admitting: *Deleted

## 2021-03-15 DIAGNOSIS — K432 Incisional hernia without obstruction or gangrene: Secondary | ICD-10-CM

## 2021-03-20 ENCOUNTER — Encounter: Payer: Self-pay | Admitting: Surgery

## 2021-03-20 ENCOUNTER — Other Ambulatory Visit: Payer: Self-pay

## 2021-03-20 ENCOUNTER — Ambulatory Visit (INDEPENDENT_AMBULATORY_CARE_PROVIDER_SITE_OTHER): Payer: Medicaid Other | Admitting: Surgery

## 2021-03-20 VITALS — BP 143/76 | HR 101 | Temp 97.9°F | Resp 14 | Ht 66.0 in | Wt 155.0 lb

## 2021-03-20 DIAGNOSIS — K439 Ventral hernia without obstruction or gangrene: Secondary | ICD-10-CM | POA: Diagnosis not present

## 2021-03-20 NOTE — Progress Notes (Signed)
[facility] History and Physical  Reason for Referral: Incisional hernia Referring Physician: Stoney Bang, MD  Chief Complaint   New Patient (Initial Visit)     Erica Banks is a 41 y.o. female.  HPI: Patient presents for evaluation of incisional hernia.  She states that she underwent cholecystectomy in July 2022 at [facility], and that she was very sick at that time.  After her surgery, she did note an improvement in her abdominal symptoms.  About a month after her surgery, she noticed a lump at her epigastric incision site, and has noted that it is increased in size since that time.  She has pain associated with this hernia, and she feels that it is causing issues with her eating.  She has noted increased abdominal distention over the last month.  She does feel that when she belches or passes gas, that her abdominal distention improves.  She has been taking Gas-X, which has helped with her distention.  She has occasional nausea without episodes of vomiting.  She has been moving her bowels, but describes them as black and loose.  She does have a history of hemorrhoids.  Her surgical history is significant for laparoscopic cholecystectomy and hysterectomy.  She was previously on blood thinning medications for history of PE, however she is no longer taking those blood thinners.  She smokes half a pack per day, has occasional alcohol use, and occasionally will smoke marijuana.  Past Medical History:  Diagnosis Date   DVT (deep venous thrombosis) (HCC)    Low iron    Panic attacks    Pulmonary embolus [facility])     Past Surgical History:  Procedure Laterality Date   ABDOMINAL HYSTERECTOMY     BIOPSY N/A 06/24/2013   Procedure: BIOPSY TERMINAL ILEUM;  Surgeon: Daneil Dolin, MD;  Location[facility];  Service: Endoscopy;  Laterality: N/A;   CHOLECYSTECTOMY     COLONOSCOPY N/A 06/24/2013   [id number] hemorrhoids/otherwise normal    nasal bone surgery     NASAL SINUS  SURGERY     SINUS IRRIGATION     TONSILLECTOMY     TUBAL LIGATION      Family History  Problem Relation Age of Onset   Diabetes Mother    Hypertension Mother    COPD Mother    Diabetes Sister    Hypertension Sister    Cancer Other    Crohn's disease Maternal Grandmother    Colon cancer Neg Hx     Social History   Tobacco Use   Smoking status: Every Day    Packs/day: 1.00    Years: 12.00    Pack years: 12.00    Types: Cigarettes   Smokeless tobacco: Never  Vaping Use   Vaping Use: Never used  Substance Use Topics   Alcohol use: Yes    Alcohol/week: 4.0 - 5.0 standard drinks    Types: 4 - 5 Cans of beer per week    Comment: occ   Drug use: Yes    Types: Marijuana    Comment: occasional    Medications: I have reviewed the patient's current medications. Allergies as of 03/20/2021       Reactions   Demerol Anaphylaxis   Per patient cardiac arrest        Medication List        Accurate as of March 20, 2021  1:37 PM. If you have any questions, ask your nurse or doctor.  STOP taking these medications    cephALEXin 500 MG capsule Commonly known as: KEFLEX Stopped by: Chessica Audia A Lamont Glasscock, DO   cyclobenzaprine 10 MG tablet Commonly known as: FLEXERIL Stopped by: Wayburn Shaler A Advik Weatherspoon, DO   Magnesium Oxide 200 MG Tabs Stopped by: Louay Myrie A Kensleigh Gates, DO   ondansetron 4 MG disintegrating tablet Commonly known as: Zofran ODT Stopped by: Shean Gerding A Kynadi Dragos, DO   Potassium Chloride ER 20 MEQ Tbcr Stopped by: Verenis Nicosia A Analisse Randle, DO       TAKE these medications    gabapentin 300 MG capsule Commonly known as: NEURONTIN Take 300 mg by mouth 3 (three) times daily.   pantoprazole 40 MG tablet Commonly known as: PROTONIX Take 1 tablet (40 mg total) by mouth 2 (two) times daily before a meal. What changed: when to take this         ROS:  Constitutional: negative for chills, fatigue, and fevers Eyes: negative for  visual disturbance and pain Ears, nose, mouth, throat, and face: negative for ear drainage, sore throat, and sinus problems Respiratory: negative for cough, wheezing, and shortness of breath Cardiovascular: negative for chest pain and palpitations Gastrointestinal: positive for abdominal pain and nausea, negative for reflux symptoms and vomiting Genitourinary:negative for dysuria, frequency, and urinary retention Integument/breast: negative for dryness and rash Hematologic/lymphatic: negative for bleeding and lymphadenopathy Musculoskeletal:negative for back pain, neck pain, and joint pain Neurological: negative for dizziness, tremors, and numbness Endocrine: negative for temperature intolerance  Blood pressure (!) 143/76, pulse (!) 101, temperature 97.9 F (36.6 C), temperature source Other (Comment), resp. rate 14, height 5\' 6"  (1.676 m), weight 155 lb (70.3 kg), SpO2 98 %. Physical Exam Vitals reviewed.  Constitutional:      Appearance: Normal appearance.  HENT:     Head: Normocephalic and atraumatic.  Eyes:     Extraocular Movements: Extraocular movements intact.     Pupils: Pupils are equal, round, and reactive to light.  Cardiovascular:     Rate and Rhythm: Normal rate and regular rhythm.  Pulmonary:     Effort: Pulmonary effort is normal.     Breath sounds: Normal breath sounds.  Abdominal:     Comments: Abdomen soft, significantly distended with fluid wave, no percussion tenderness, nontender to palpation, epigastric incisional hernia defect palpable, soft and reducible, nontender, no rigidity, guarding, rebound tenderness  Musculoskeletal:        General: Normal range of motion.     Cervical back: Normal range of motion.  Skin:    General: Skin is warm and dry.  Neurological:     General: No focal deficit present.     Mental Status: She is alert and oriented to person, place, and time.  Psychiatric:        Mood and Affect: Mood normal.        Behavior: Behavior  normal.    Results: No results found for this or any previous visit (from the past 48 hour(s)).  No results found.   Assessment & Plan:  Erica Banks is a 41 y.o. female who presents for evaluation of her epigastric incisional hernia after laparoscopic cholecystectomy in July 2022.  -Patient denied any history of known liver disease or previous fluid in her abdomen, however her abdomen is significantly distended with fluid wave -Given her physical exam findings, I am concerned that she may have undiagnosed liver disease -Prior to any surgical intervention, will obtain CT abdomen and pelvis with IV and oral contrast to evaluate her epigastric hernia in addition  to evaluate liver and for possible ascites -We did discuss that she has an incisional hernia, which is currently soft and reducible.  We discussed surgical options the risk and benefits of open epigastric/incisional hernia repair with mesh placement, including but not limited to bleeding, infection, injury to surrounding structures, hernia recurrence, and need for additional procedure.  It was further discussed, that if she does have fluid in her abdomen, it puts her at increased risk for any surgical sites not healing, and hernia recurrence. -Given all of the above information, the patient agreed with plan to obtain additional imaging prior to surgical intervention -Will call patient after CT abdomen and pelvis performed to discuss results -Patient was advised to present more urgently if she develops worsening pain at her hernia site, incarceration of the hernia, overlying skin changes, nausea, vomiting, and obstipation  All questions were answered to the satisfaction of the patient.   Graciella Freer, DO [facility] [address] [place], [place] [address] [phone] (office)

## 2021-03-21 ENCOUNTER — Other Ambulatory Visit: Payer: Self-pay | Admitting: *Deleted

## 2021-03-21 ENCOUNTER — Ambulatory Visit (HOSPITAL_COMMUNITY)
Admission: RE | Admit: 2021-03-21 | Discharge: 2021-03-21 | Disposition: A | Discharge status: Home / Self Care | Payer: Medicaid Other | Source: Ambulatory Visit | Attending: Surgery | Admitting: Surgery

## 2021-03-21 DIAGNOSIS — K439 Ventral hernia without obstruction or gangrene: Secondary | ICD-10-CM | POA: Insufficient documentation

## 2021-03-21 DIAGNOSIS — K432 Incisional hernia without obstruction or gangrene: Secondary | ICD-10-CM | POA: Insufficient documentation

## 2021-03-21 IMAGING — CT CT ABD-PELV W/ CM
2 of 4 series · 15 of 46 positions shown, 17 images · IV contrast (Omnipaque or Isovue)
Comparison: [DATE]

CLINICAL DATA: Abdominal pain, epigastric pain, bloating, upper
abdominal discomfort for 2 months, prior hysterectomy,
cholecystectomy. Weight loss of 60 pounds in 5 weeks leading up to
gallbladder surgery. History GERD, smoking

EXAM:
CT ABDOMEN AND PELVIS WITH CONTRAST
TECHNIQUE: Multidetector CT imaging of the abdomen and pelvis was performed
using the standard protocol following bolus administration of
intravenous contrast.

[Series 2: axial st · axial · 0.85mm/px · z∈[+801,+1291]mm · 12 of 108 slices shown, 14 images]
[im 5/108  soft-tissue]
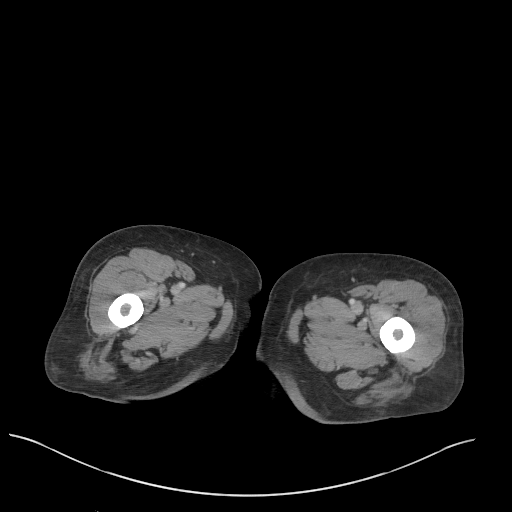
[im 5/108  bone]
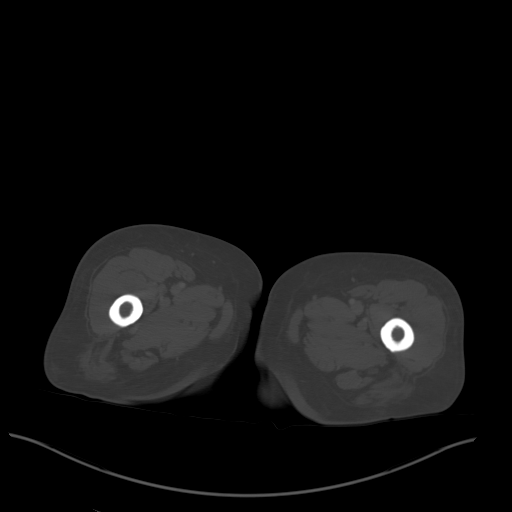
[im 14/108  soft-tissue]
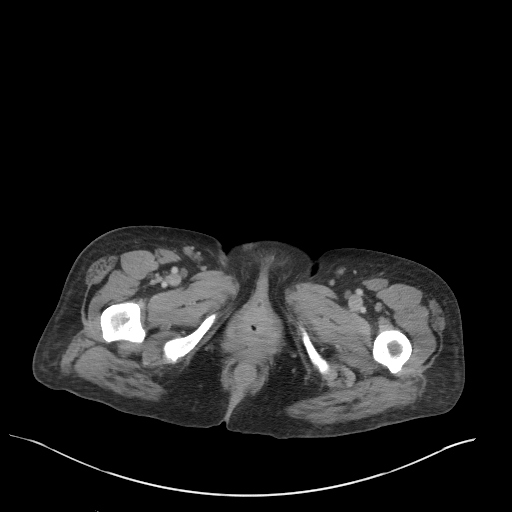
[im 24/108  soft-tissue]
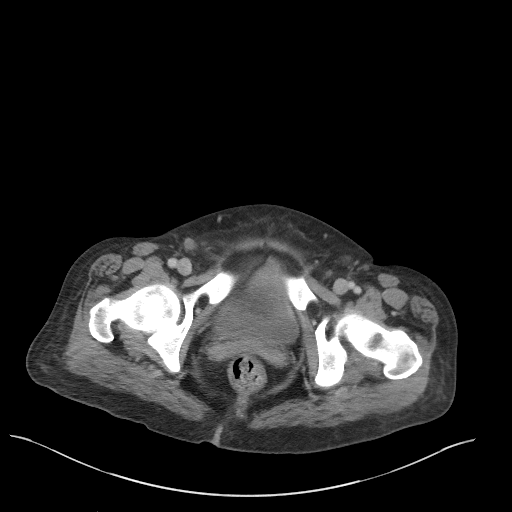
[im 33/108  soft-tissue]
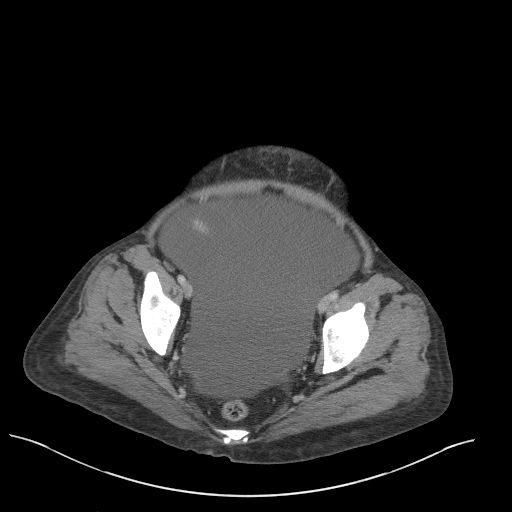
[im 42/108  soft-tissue]
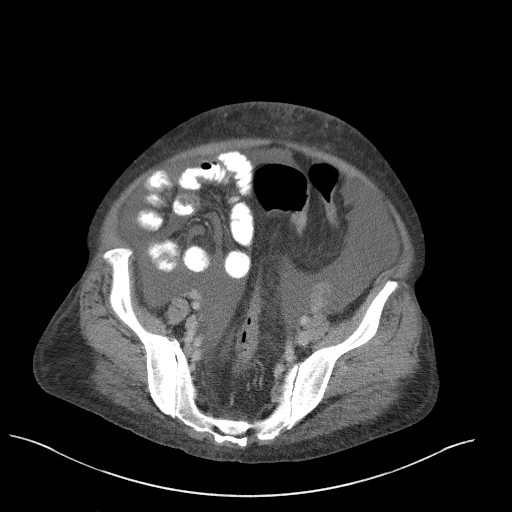
[im 52/108  soft-tissue]
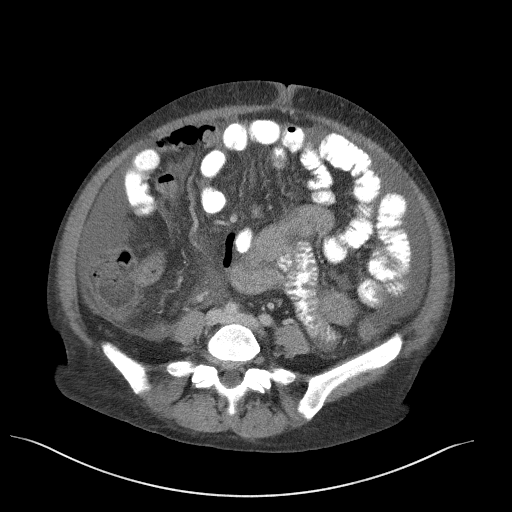
[im 56/108  soft-tissue]
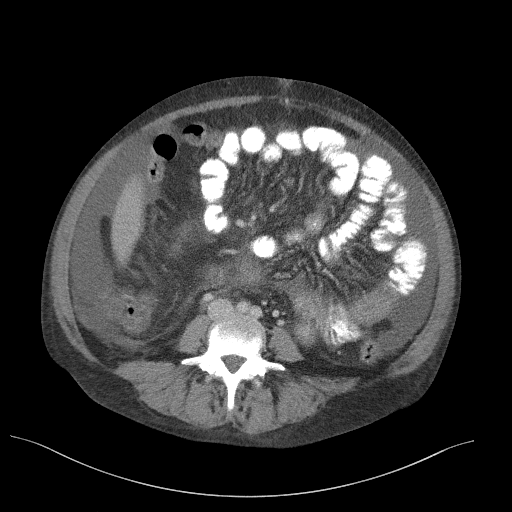
[im 66/108  soft-tissue]
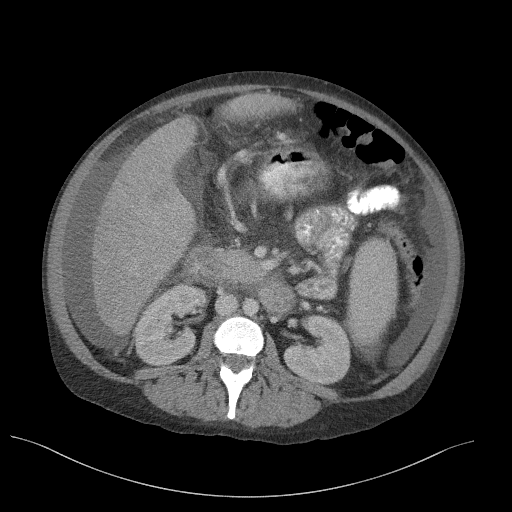
[im 75/108  soft-tissue]
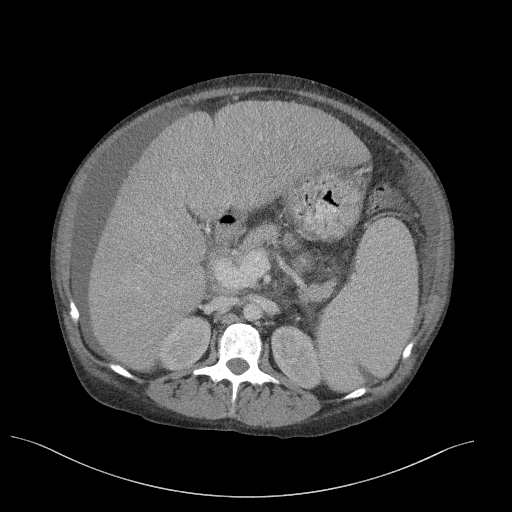
[im 75/108  bone]
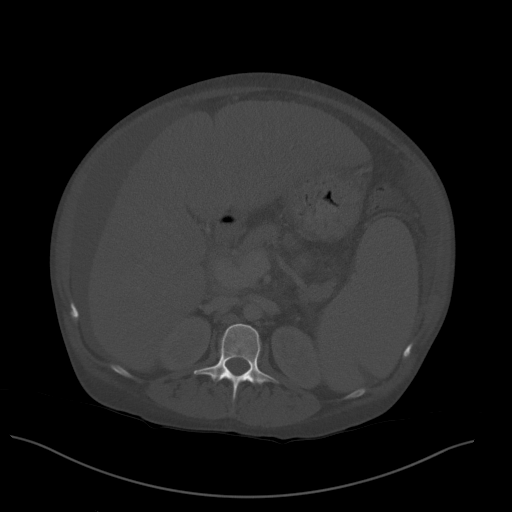
[im 84/108  soft-tissue]
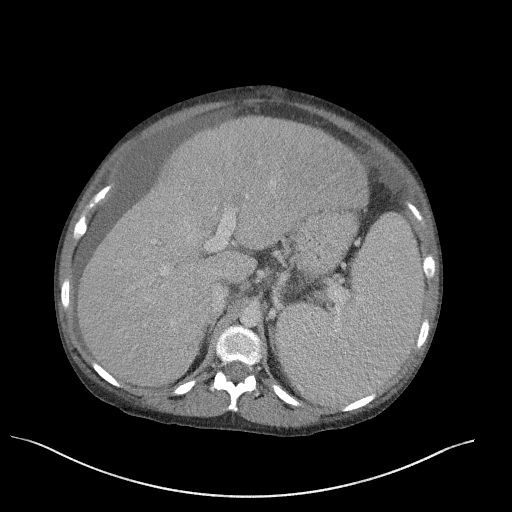
[im 94/108  soft-tissue]
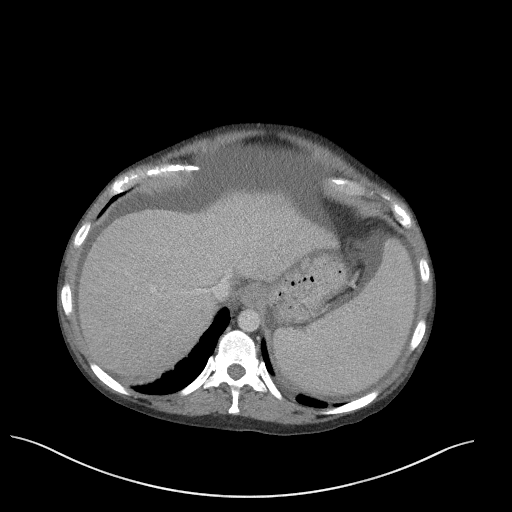
[im 103/108  soft-tissue]
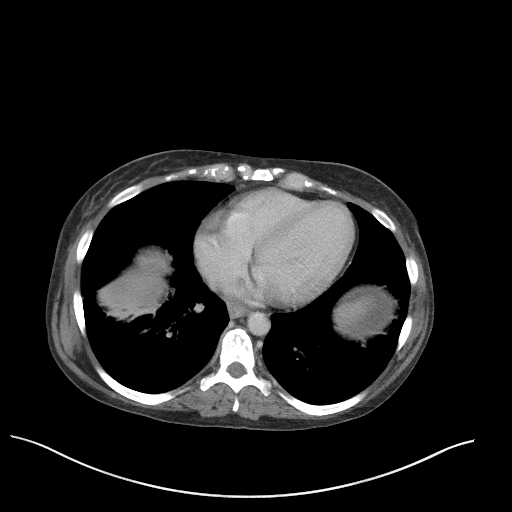

[Series 5: coronal st · coronal · 0.78mm/px · 3 of 117 slices shown]
[im 39/117  soft-tissue]
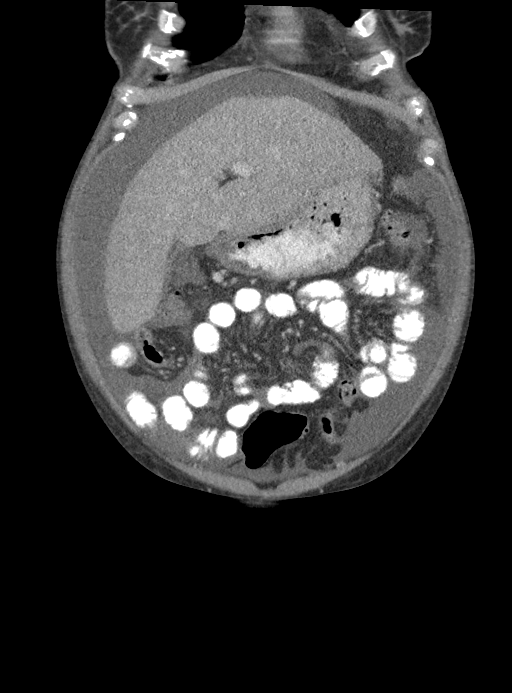
[im 52/117  soft-tissue]
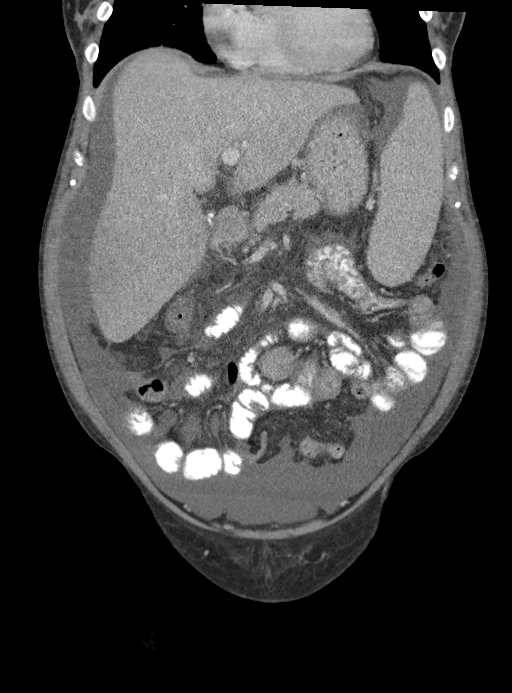
[im 65/117  soft-tissue]
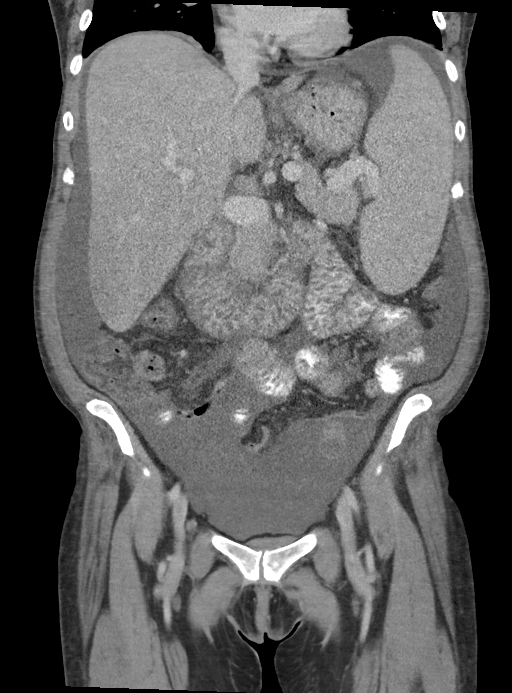

[15 of 46 positions shown; findings below may reference images not displayed]

RADIATION DOSE REDUCTION: This exam was performed according to the
departmental dose-optimization program which includes automated
exposure control, adjustment of the mA and/or kV according to
patient size and/or use of iterative reconstruction technique.

CONTRAST:  100mL OMNIPAQUE IOHEXOL 300 MG/ML SOLN IV. Dilute oral
contrast.
FINDINGS: Lower chest: Minimal bibasilar atelectasis

Hepatobiliary: Gallbladder surgically absent. Small hypervascular
focus RIGHT lobe liver inferiorly 6 mm diameter image 48, not seen
previously. Additionally, new foci of low attenuation are seen
medially RIGHT lobe 2.2 x 1.5 cm image 42 and 13 x 8 mm image 29.
Interval enlargement of lateral segment LEFT lobe liver.

Pancreas: Normal appearance

Spleen: Newly enlarged since previous study, measuring 17.5 x 6.9 x
19.8 cm (volume = [ZJ] cm^3). No focal abnormalities.

Adrenals/Urinary Tract: Adrenal glands, kidneys, ureters, and
bladder normal appearance

Stomach/Bowel: Normal appendix. Large and small bowel loops
unremarkable. Stomach normal appearance

Vascular/Lymphatic: Vascular structures patent.  No adenopathy.

Reproductive: Uterus surgically absent. Small cyst LEFT ovary 19 x
18 mm image 70. Small cyst RIGHT ovary 3.0 x 2.7 cm image 71. No
follow-up imaging recommended. Note: This recommendation does not
apply to premenarchal patients and to those with increased risk
(genetic, family history, elevated tumor markers or other high-risk
factors) of ovarian cancer. Reference: JACR [DATE]):[id number]

Other: Moderate ascites. No free air. Subcutaneous infiltration in
anterior abdominal wall subcutaneous fat as well as within
mesentery.

Musculoskeletal: Unremarkable
IMPRESSION: New enlargement of lateral segment LEFT lobe liver, moderate
ascites, and splenomegaly, findings highly suggestive of cirrhosis.

Nonspecific hypervascular and hypoechoic lesions within the RIGHT
lobe of the liver, new versus previous study, uncertain etiology;
follow-up non emergent characterization of these lesions by MR
imaging with and without contrast recommended to exclude tumor.

## 2021-03-21 MED ORDER — IOHEXOL 9 MG/ML PO SOLN
ORAL | Status: AC
  Filled 2021-03-21: qty 1000

## 2021-03-21 MED ORDER — IOHEXOL 300 MG/ML  SOLN
100.0000 mL | Freq: Once | INTRAMUSCULAR | Status: AC | PRN
  Administered 2021-03-21: 100 mL via INTRAVENOUS

## 2021-03-21 NOTE — Addendum Note (Signed)
Addended by: Phillips Odor on: 03/21/2021 02:32 PM   Modules accepted: Orders

## 2021-03-22 ENCOUNTER — Other Ambulatory Visit: Payer: Self-pay | Admitting: *Deleted

## 2021-03-22 DIAGNOSIS — R109 Unspecified abdominal pain: Secondary | ICD-10-CM

## 2021-03-22 DIAGNOSIS — R935 Abnormal findings on diagnostic imaging of other abdominal regions, including retroperitoneum: Secondary | ICD-10-CM

## 2021-03-23 ENCOUNTER — Telehealth: Payer: Self-pay | Admitting: Surgery

## 2021-03-23 DIAGNOSIS — K769 Liver disease, unspecified: Secondary | ICD-10-CM

## 2021-03-23 NOTE — Telephone Encounter (Signed)
Called to update the patient regarding the findings of her CT scan.  It was explained that she does have a moderate amount of ascites with other changes in her abdomen concerning for liver cirrhosis.  I further explained that I have reached out to one of my GI colleagues, who will see her in office, and will likely need to do more work-up to evaluate the extent of her liver disease.  It was further explained that until her liver disease is adequately controlled, it would be unsafe for me to perform hernia repair given that the repair will likely break down and not heal appropriately.  My office has sent the referral over to the GI office, and she will be called once her appointment has been scheduled.  All of her questions were answered to her expressed satisfaction.  Theophilus Kinds, DO [facility] [address] [place], [place] [address] [phone] (office)

## 2021-03-27 ENCOUNTER — Encounter (HOSPITAL_COMMUNITY): Payer: Self-pay | Admitting: Emergency Medicine

## 2021-03-27 ENCOUNTER — Inpatient Hospital Stay (HOSPITAL_COMMUNITY)
Admission: EM | Admit: 2021-03-27 | Discharge: 2021-03-30 | DRG: 432 | Disposition: A | Discharge status: Home / Self Care | Payer: Medicaid Other | Attending: Internal Medicine | Admitting: Internal Medicine

## 2021-03-27 ENCOUNTER — Inpatient Hospital Stay (HOSPITAL_COMMUNITY): Payer: Medicaid Other

## 2021-03-27 ENCOUNTER — Other Ambulatory Visit: Payer: Self-pay

## 2021-03-27 ENCOUNTER — Emergency Department (HOSPITAL_COMMUNITY): Payer: Medicaid Other

## 2021-03-27 DIAGNOSIS — Z8379 Family history of other diseases of the digestive system: Secondary | ICD-10-CM

## 2021-03-27 DIAGNOSIS — Z8249 Family history of ischemic heart disease and other diseases of the circulatory system: Secondary | ICD-10-CM

## 2021-03-27 DIAGNOSIS — Z825 Family history of asthma and other chronic lower respiratory diseases: Secondary | ICD-10-CM

## 2021-03-27 DIAGNOSIS — F419 Anxiety disorder, unspecified: Secondary | ICD-10-CM | POA: Diagnosis present

## 2021-03-27 DIAGNOSIS — Z86711 Personal history of pulmonary embolism: Secondary | ICD-10-CM

## 2021-03-27 DIAGNOSIS — E876 Hypokalemia: Secondary | ICD-10-CM | POA: Diagnosis present

## 2021-03-27 DIAGNOSIS — R161 Splenomegaly, not elsewhere classified: Secondary | ICD-10-CM | POA: Diagnosis present

## 2021-03-27 DIAGNOSIS — D62 Acute posthemorrhagic anemia: Secondary | ICD-10-CM | POA: Diagnosis present

## 2021-03-27 DIAGNOSIS — Z833 Family history of diabetes mellitus: Secondary | ICD-10-CM

## 2021-03-27 DIAGNOSIS — G629 Polyneuropathy, unspecified: Secondary | ICD-10-CM | POA: Diagnosis present

## 2021-03-27 DIAGNOSIS — D509 Iron deficiency anemia, unspecified: Secondary | ICD-10-CM | POA: Diagnosis present

## 2021-03-27 DIAGNOSIS — F1010 Alcohol abuse, uncomplicated: Secondary | ICD-10-CM | POA: Diagnosis present

## 2021-03-27 DIAGNOSIS — K7031 Alcoholic cirrhosis of liver with ascites: Secondary | ICD-10-CM | POA: Diagnosis present

## 2021-03-27 DIAGNOSIS — D649 Anemia, unspecified: Secondary | ICD-10-CM | POA: Diagnosis not present

## 2021-03-27 DIAGNOSIS — Z79899 Other long term (current) drug therapy: Secondary | ICD-10-CM

## 2021-03-27 DIAGNOSIS — K769 Liver disease, unspecified: Secondary | ICD-10-CM | POA: Diagnosis not present

## 2021-03-27 DIAGNOSIS — E538 Deficiency of other specified B group vitamins: Secondary | ICD-10-CM

## 2021-03-27 DIAGNOSIS — R188 Other ascites: Secondary | ICD-10-CM | POA: Diagnosis not present

## 2021-03-27 DIAGNOSIS — I8510 Secondary esophageal varices without bleeding: Secondary | ICD-10-CM | POA: Diagnosis not present

## 2021-03-27 DIAGNOSIS — Z9851 Tubal ligation status: Secondary | ICD-10-CM

## 2021-03-27 DIAGNOSIS — K644 Residual hemorrhoidal skin tags: Secondary | ICD-10-CM | POA: Diagnosis present

## 2021-03-27 DIAGNOSIS — K7460 Unspecified cirrhosis of liver: Secondary | ICD-10-CM | POA: Diagnosis not present

## 2021-03-27 DIAGNOSIS — K766 Portal hypertension: Secondary | ICD-10-CM | POA: Diagnosis present

## 2021-03-27 DIAGNOSIS — D508 Other iron deficiency anemias: Secondary | ICD-10-CM

## 2021-03-27 DIAGNOSIS — F410 Panic disorder [episodic paroxysmal anxiety] without agoraphobia: Secondary | ICD-10-CM | POA: Diagnosis present

## 2021-03-27 DIAGNOSIS — E871 Hypo-osmolality and hyponatremia: Secondary | ICD-10-CM | POA: Diagnosis present

## 2021-03-27 DIAGNOSIS — K3189 Other diseases of stomach and duodenum: Secondary | ICD-10-CM | POA: Diagnosis present

## 2021-03-27 DIAGNOSIS — Z86718 Personal history of other venous thrombosis and embolism: Secondary | ICD-10-CM

## 2021-03-27 DIAGNOSIS — K432 Incisional hernia without obstruction or gangrene: Secondary | ICD-10-CM | POA: Diagnosis present

## 2021-03-27 DIAGNOSIS — Z560 Unemployment, unspecified: Secondary | ICD-10-CM

## 2021-03-27 DIAGNOSIS — I8511 Secondary esophageal varices with bleeding: Secondary | ICD-10-CM | POA: Diagnosis present

## 2021-03-27 DIAGNOSIS — K7011 Alcoholic hepatitis with ascites: Secondary | ICD-10-CM

## 2021-03-27 DIAGNOSIS — Z20822 Contact with and (suspected) exposure to covid-19: Secondary | ICD-10-CM | POA: Diagnosis present

## 2021-03-27 DIAGNOSIS — K921 Melena: Secondary | ICD-10-CM | POA: Diagnosis not present

## 2021-03-27 DIAGNOSIS — F17210 Nicotine dependence, cigarettes, uncomplicated: Secondary | ICD-10-CM | POA: Diagnosis present

## 2021-03-27 DIAGNOSIS — K219 Gastro-esophageal reflux disease without esophagitis: Secondary | ICD-10-CM | POA: Diagnosis present

## 2021-03-27 DIAGNOSIS — Z888 Allergy status to other drugs, medicaments and biological substances status: Secondary | ICD-10-CM | POA: Diagnosis not present

## 2021-03-27 HISTORY — DX: Anemia, unspecified: D64.9

## 2021-03-27 HISTORY — DX: Polyneuropathy, unspecified: G62.9

## 2021-03-27 LAB — CBC WITH DIFFERENTIAL/PLATELET
Abs Immature Granulocytes: 0.02 10*3/uL (ref 0.00–0.07)
Basophils Absolute: 0 10*3/uL (ref 0.0–0.1)
Basophils Relative: 1 %
Eosinophils Absolute: 0.1 10*3/uL (ref 0.0–0.5)
Eosinophils Relative: 1 %
HCT: 21.3 % — ABNORMAL LOW (ref 36.0–46.0)
Hemoglobin: 6.5 g/dL — CL (ref 12.0–15.0)
Immature Granulocytes: 0 %
Lymphocytes Relative: 27 %
Lymphs Abs: 1.5 10*3/uL (ref 0.7–4.0)
MCH: 29.3 pg (ref 26.0–34.0)
MCHC: 30.5 g/dL (ref 30.0–36.0)
MCV: 95.9 fL (ref 80.0–100.0)
Monocytes Absolute: 0.5 10*3/uL (ref 0.1–1.0)
Monocytes Relative: 9 %
Neutro Abs: 3.3 10*3/uL (ref 1.7–7.7)
Neutrophils Relative %: 62 %
Platelets: 221 10*3/uL (ref 150–400)
RBC: 2.22 MIL/uL — ABNORMAL LOW (ref 3.87–5.11)
RDW: 16.8 % — ABNORMAL HIGH (ref 11.5–15.5)
WBC: 5.4 10*3/uL (ref 4.0–10.5)
nRBC: 0 % (ref 0.0–0.2)

## 2021-03-27 LAB — RETICULOCYTES
Immature Retic Fract: 20.6 % — ABNORMAL HIGH (ref 2.3–15.9)
RBC.: 2.18 MIL/uL — ABNORMAL LOW (ref 3.87–5.11)
Retic Count, Absolute: 88.3 10*3/uL (ref 19.0–186.0)
Retic Ct Pct: 4.1 % — ABNORMAL HIGH (ref 0.4–3.1)

## 2021-03-27 LAB — ALBUMIN, PLEURAL OR PERITONEAL FLUID: Albumin, Fluid: <1.5 g/dL

## 2021-03-27 LAB — COMPREHENSIVE METABOLIC PANEL
ALT: 15 U/L (ref 0–44)
AST: 83 U/L — ABNORMAL HIGH (ref 15–41)
Albumin: 2.2 g/dL — ABNORMAL LOW (ref 3.5–5.0)
Alkaline Phosphatase: 125 U/L (ref 38–126)
Anion gap: 9 (ref 5–15)
BUN: 6 mg/dL (ref 6–20)
CO2: 24 mmol/L (ref 22–32)
Calcium: 8 mg/dL — ABNORMAL LOW (ref 8.9–10.3)
Chloride: 100 mmol/L (ref 98–111)
Creatinine, Ser: 0.68 mg/dL (ref 0.44–1.00)
GFR, Estimated: >60 mL/min (ref >60)
Glucose, Bld: 114 mg/dL — ABNORMAL HIGH (ref 70–99)
Potassium: 3.1 mmol/L — ABNORMAL LOW (ref 3.5–5.1)
Sodium: 133 mmol/L — ABNORMAL LOW (ref 135–145)
Total Bilirubin: 3.6 mg/dL — ABNORMAL HIGH (ref 0.3–1.2)
Total Protein: 6.2 g/dL — ABNORMAL LOW (ref 6.5–8.1)

## 2021-03-27 LAB — PROTIME-INR
INR: 1.7 — ABNORMAL HIGH (ref 0.8–1.2)
Prothrombin Time: 20.1 seconds — ABNORMAL HIGH (ref 11.4–15.2)

## 2021-03-27 LAB — GLUCOSE, PLEURAL OR PERITONEAL FLUID: Glucose, Fluid: 118 mg/dL

## 2021-03-27 LAB — BODY FLUID CELL COUNT WITH DIFFERENTIAL
Eos, Fluid: 0 %
Lymphs, Fluid: 50 %
Monocyte-Macrophage-Serous Fluid: 49 % — ABNORMAL LOW (ref 50–90)
Neutrophil Count, Fluid: 1 % (ref 0–25)
Total Nucleated Cell Count, Fluid: 42 cu mm (ref 0–1000)

## 2021-03-27 LAB — PREPARE RBC (CROSSMATCH)

## 2021-03-27 LAB — PROTEIN, PLEURAL OR PERITONEAL FLUID: Total protein, fluid: <3 g/dL

## 2021-03-27 LAB — GRAM STAIN

## 2021-03-27 LAB — IRON AND TIBC
Iron: 27 ug/dL — ABNORMAL LOW (ref 28–170)
Saturation Ratios: 16 % (ref 10.4–31.8)
TIBC: 172 ug/dL — ABNORMAL LOW (ref 250–450)
UIBC: 145 ug/dL

## 2021-03-27 LAB — LACTATE DEHYDROGENASE, PLEURAL OR PERITONEAL FLUID: LD, Fluid: 17 U/L (ref 3–23)

## 2021-03-27 LAB — VITAMIN B12: Vitamin B-12: 1026 pg/mL — ABNORMAL HIGH (ref 180–914)

## 2021-03-27 LAB — RESP PANEL BY RT-PCR (FLU A&B, COVID) ARPGX2
Influenza A by PCR: NEGATIVE
Influenza B by PCR: NEGATIVE
SARS Coronavirus 2 by RT PCR: NEGATIVE

## 2021-03-27 LAB — FOLATE: Folate: 4.1 ng/mL — ABNORMAL LOW (ref >5.9)

## 2021-03-27 LAB — LIPASE, BLOOD: Lipase: 25 U/L (ref 11–51)

## 2021-03-27 LAB — POC OCCULT BLOOD, ED: Fecal Occult Bld: POSITIVE — AB

## 2021-03-27 LAB — FERRITIN: Ferritin: 8 ng/mL — ABNORMAL LOW (ref 11–307)

## 2021-03-27 LAB — HIV ANTIBODY (ROUTINE TESTING W REFLEX): HIV Screen 4th Generation wRfx: NONREACTIVE

## 2021-03-27 IMAGING — US US ABDOMEN COMPLETE
1 series · 14 of 25 positions shown · non-contrast
Comparison: CT [DATE]

CLINICAL DATA: Cirrhosis and ascites.

EXAM:
ABDOMEN ULTRASOUND COMPLETE

[Series 1: us abdomen complete · 14 of 135 slices shown]
[im 1/135]
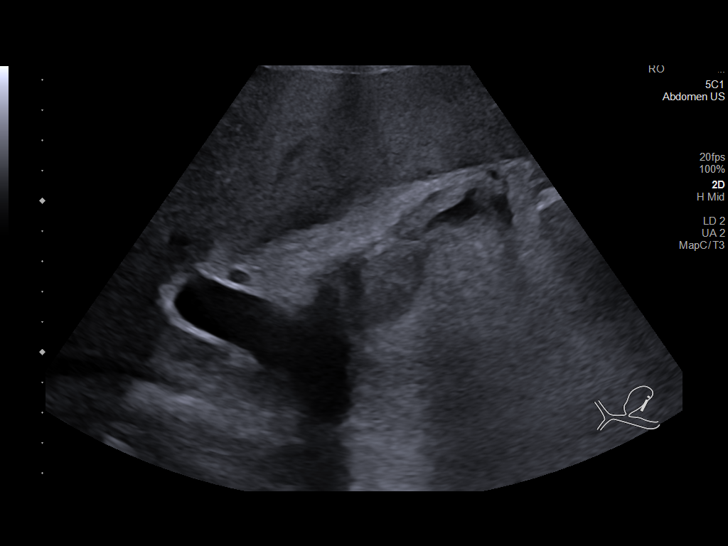
[im 12/135]
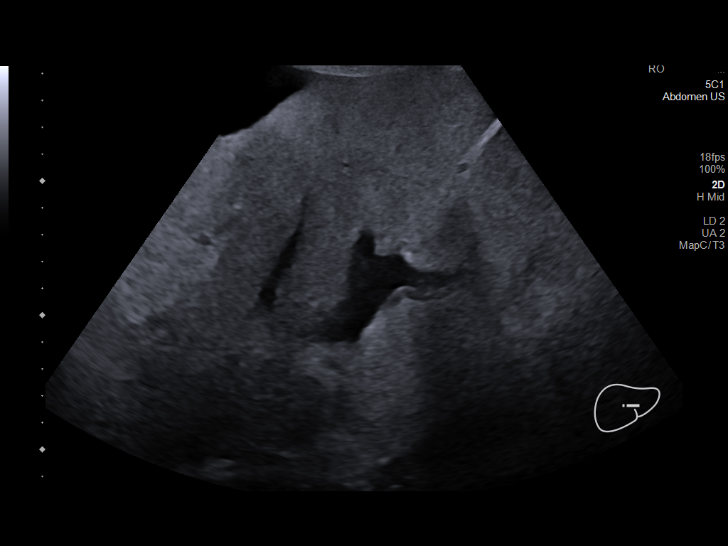
[im 23/135]
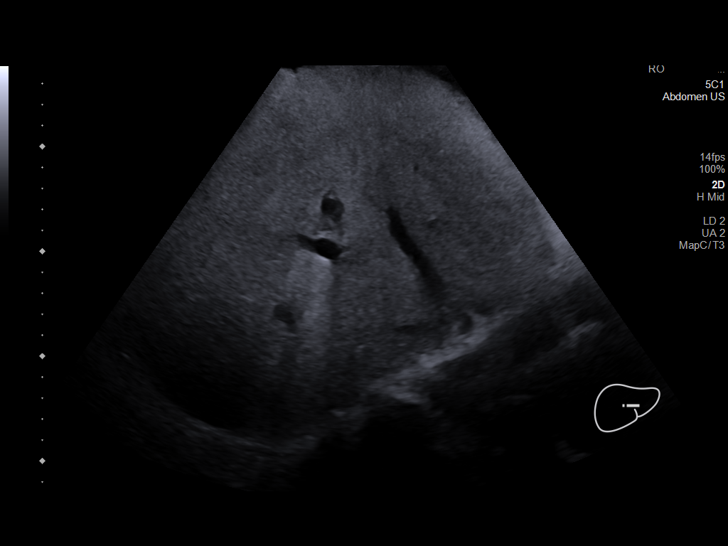
[im 34/135]
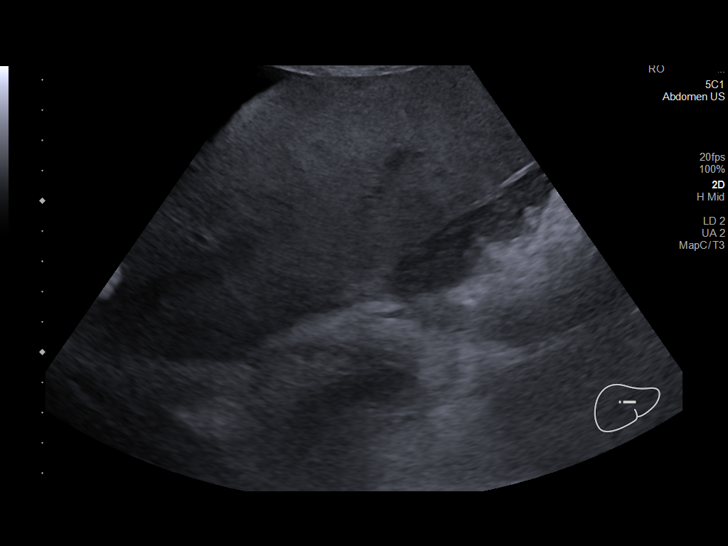
[im 45/135]
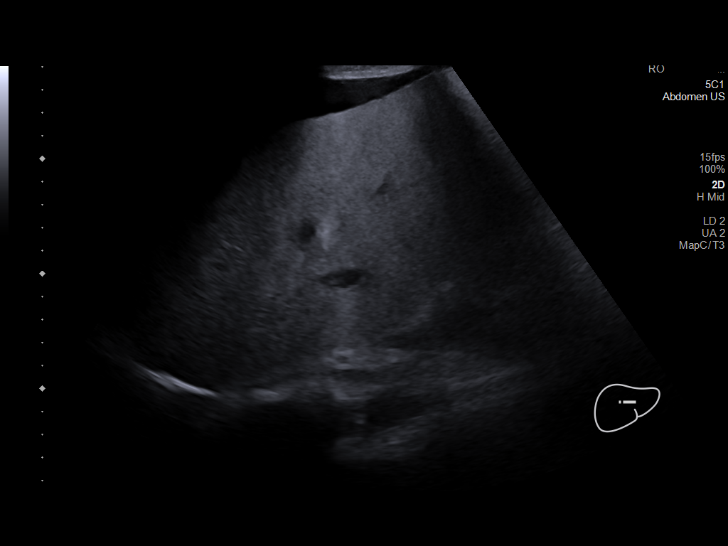
[im 51/135]
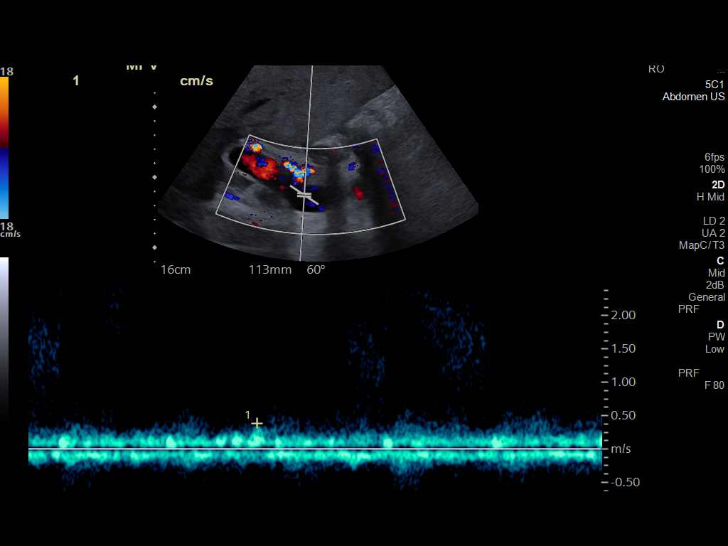
[im 62/135]
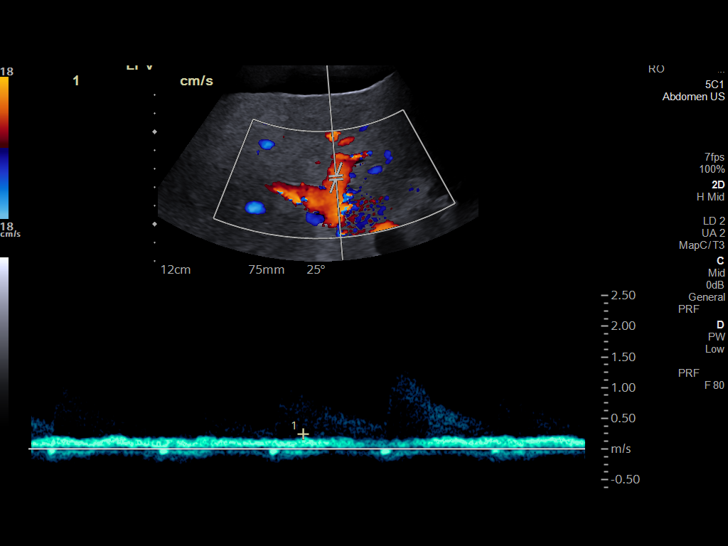
[im 73/135]
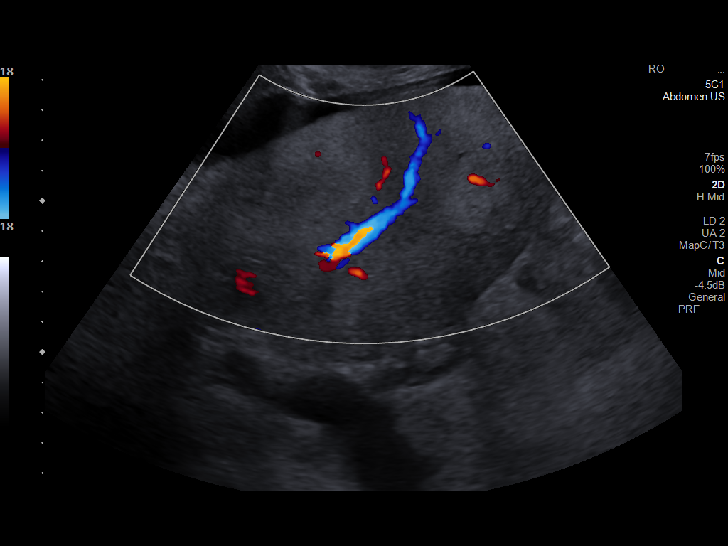
[im 84/135]
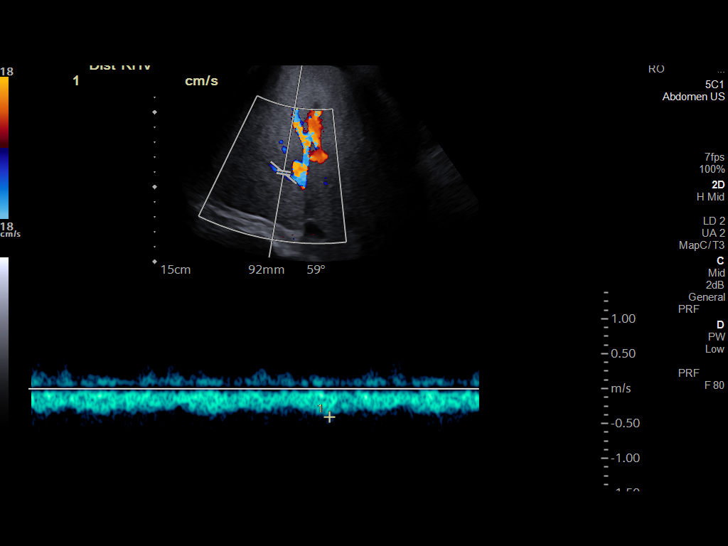
[im 90/135]
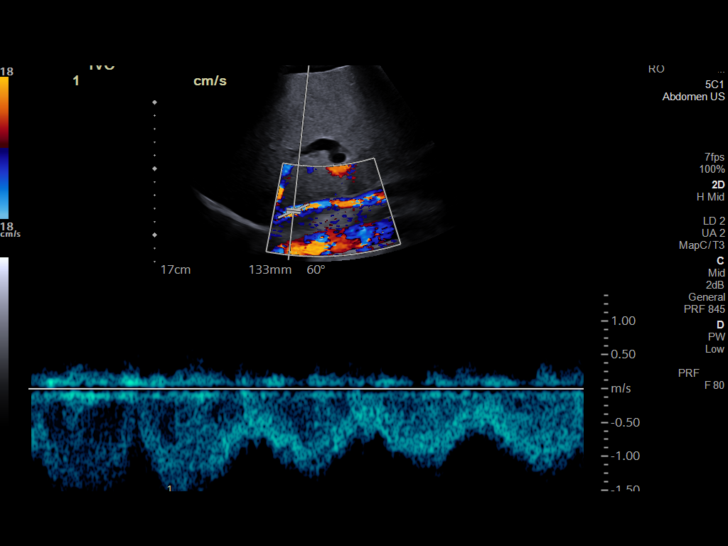
[im 101/135]
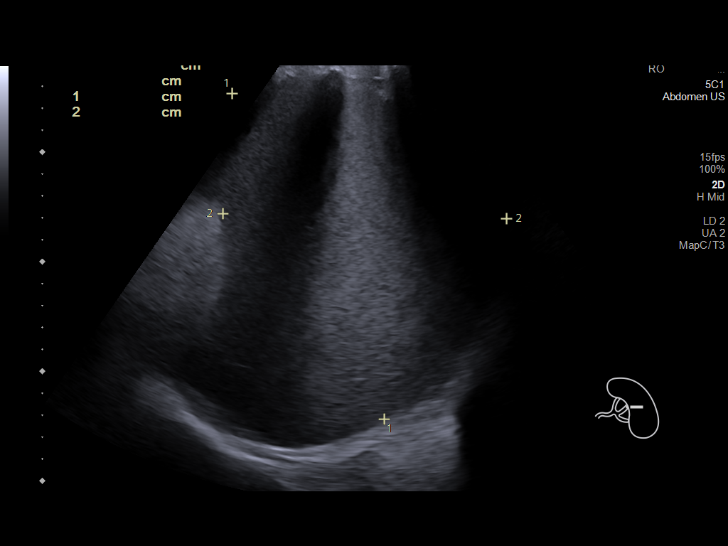
[im 112/135]
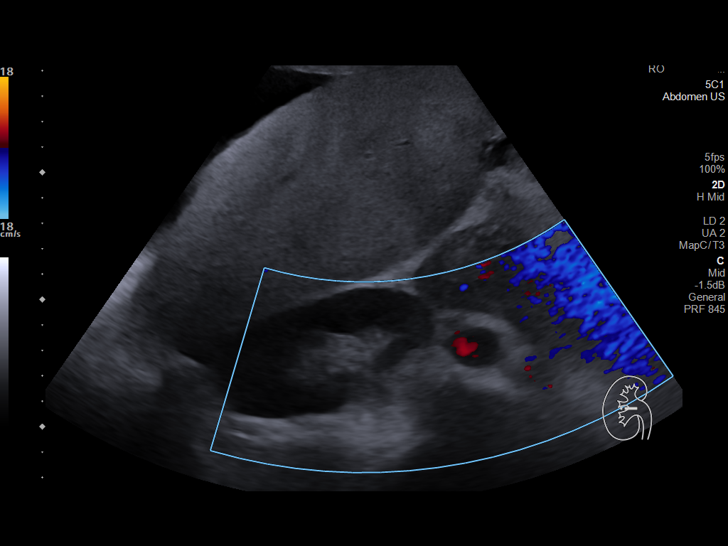
[im 123/135]
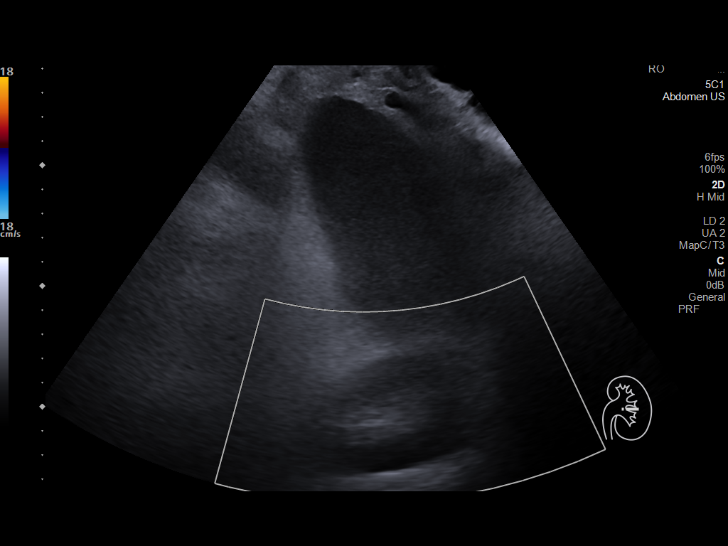
[im 135/135]
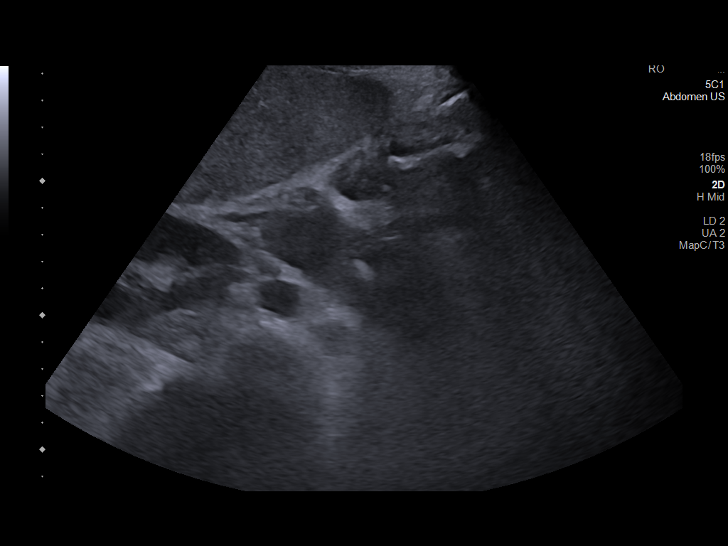

[14 of 25 positions shown; findings below may reference images not displayed]

FINDINGS: Gallbladder: Cholecystectomy.

Common bile duct: Diameter: 3 mm

Liver: Liver has a mildly nodular contour and suggestive for
cirrhosis. Small amount of perihepatic ascites. Increased
echogenicity in the liver. No discrete liver lesion identified.
Portal vein is patent on color Doppler imaging with normal direction
of blood flow towards the liver.

IVC: No abnormality visualized.

Pancreas: Limited evaluation

Spleen: Splenomegaly. Spleen measures 19.0 x 16.4 x 12.9, calculated
volume is [1F] mL.

Right Kidney: Length: 11.7 cm. Echogenicity within normal limits. No
mass or hydronephrosis visualized.

Left Kidney: Length: 10.7 cm. Echogenicity within normal limits. No
mass or hydronephrosis visualized.

Abdominal aorta: No aneurysm visualized.

Other findings: Perihepatic ascites.
IMPRESSION: 1. Cirrhosis. Liver has a nodular contour with increased
echogenicity. No discrete liver lesions but this could be better
characterized with MRI.
2. Ascites and splenomegaly. Findings are compatible with portal
hypertension.

## 2021-03-27 IMAGING — US US PARACENTESIS
1 series · 2 of 2 positions shown · non-contrast
Comparison: none

INDICATION: Ascites

[Series 1: us paracentesis · 0.21mm/px · 2 of 2 slices shown]
[im 1/2]
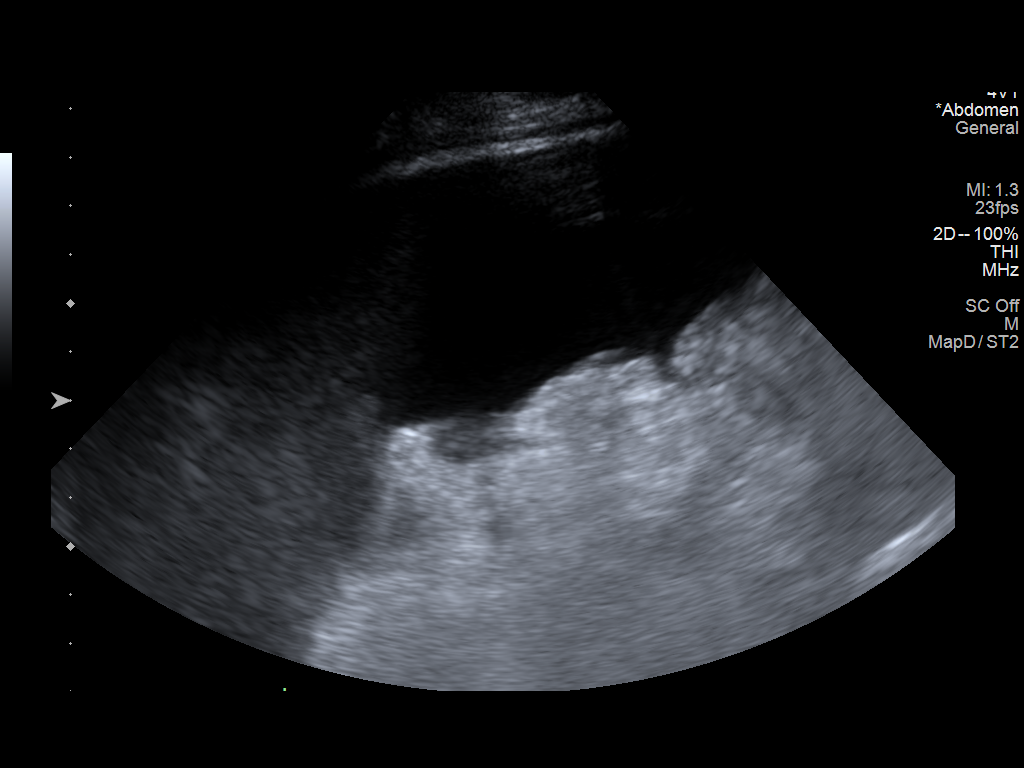
[im 2/2]
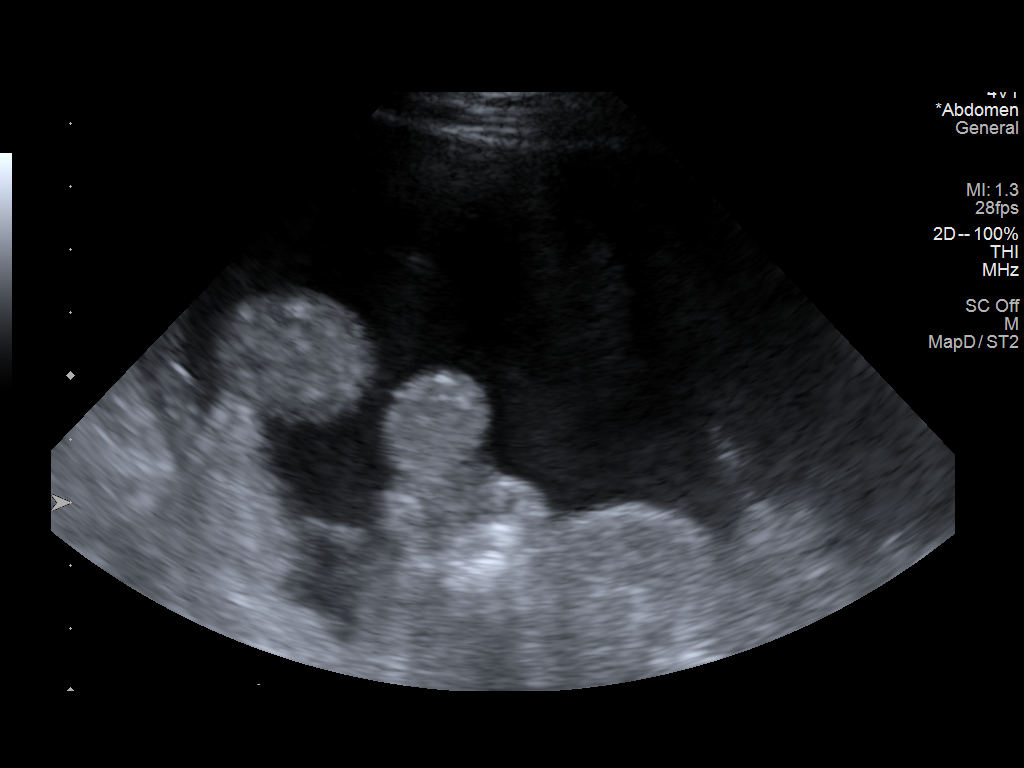

[2 of 2 positions shown; findings below may reference images not displayed]

EXAM:
ULTRASOUND GUIDED DIAGNOSTIC AND THERAPEUTIC PARACENTESIS

MEDICATIONS:
None.

COMPLICATIONS:
None immediate.

PROCEDURE:
Informed written consent was obtained from the patient after a
discussion of the risks, benefits and alternatives to treatment. A
timeout was performed prior to the initiation of the procedure.

Initial ultrasound scanning demonstrates a large amount of ascites
within the right lower abdominal quadrant. The right lower abdomen
was prepped and draped in the usual sterile fashion. 1% lidocaine
was used for local anesthesia.

Following this, a 5 French catheter was introduced. An ultrasound
image was saved for documentation purposes. The paracentesis was
performed. The catheter was removed and a dressing was applied. The
patient tolerated the procedure well without immediate post
procedural complication.
FINDINGS: A total of approximately 3.5 L of yellow ascitic fluid was removed.
Specimens were sent to laboratory for requested analysis.
IMPRESSION: Successful ultrasound-guided paracentesis yielding 3.5 liters of
peritoneal fluid.

## 2021-03-27 MED ORDER — ONDANSETRON HCL 4 MG PO TABS: 4.0000 mg | ORAL_TABLET | Freq: Four times a day (QID) | ORAL | Status: DC | PRN

## 2021-03-27 MED ORDER — ONDANSETRON HCL 4 MG/2ML IJ SOLN
4.0000 mg | Freq: Four times a day (QID) | INTRAMUSCULAR | Status: DC | PRN
Start: 2021-03-27 — End: 2021-03-30
  Administered 2021-03-27: 4 mg via INTRAVENOUS
  Filled 2021-03-27: qty 2

## 2021-03-27 MED ORDER — POTASSIUM CHLORIDE IN NACL 20-0.9 MEQ/L-% IV SOLN: INTRAVENOUS | Status: DC

## 2021-03-27 MED ORDER — HYDROCODONE-ACETAMINOPHEN 5-325 MG PO TABS
1.0000 | ORAL_TABLET | Freq: Four times a day (QID) | ORAL | Status: DC | PRN
  Administered 2021-03-27 – 2021-03-30 (×9): 1 via ORAL
  Filled 2021-03-27 (×9): qty 1

## 2021-03-27 MED ORDER — OCTREOTIDE LOAD VIA INFUSION
50.0000 ug | Freq: Once | INTRAVENOUS | Status: AC
  Administered 2021-03-27: 50 ug via INTRAVENOUS
  Filled 2021-03-27: qty 25

## 2021-03-27 MED ORDER — FOLIC ACID 1 MG PO TABS
1.0000 mg | ORAL_TABLET | Freq: Every day | ORAL | Status: DC
  Administered 2021-03-27 – 2021-03-28 (×2): 1 mg via ORAL
  Filled 2021-03-27 (×4): qty 1

## 2021-03-27 MED ORDER — ALBUMIN HUMAN 25 % IV SOLN
INTRAVENOUS | Status: AC
  Filled 2021-03-27: qty 150

## 2021-03-27 MED ORDER — ALBUMIN HUMAN 25 % IV SOLN
100.0000 g | Freq: Once | INTRAVENOUS | Status: AC
  Administered 2021-03-27: 100 g via INTRAVENOUS
  Filled 2021-03-27: qty 400

## 2021-03-27 MED ORDER — GABAPENTIN 300 MG PO CAPS
300.0000 mg | ORAL_CAPSULE | Freq: Three times a day (TID) | ORAL | Status: DC
  Administered 2021-03-27 – 2021-03-30 (×10): 300 mg via ORAL
  Filled 2021-03-27 (×11): qty 1

## 2021-03-27 MED ORDER — ACETAMINOPHEN 650 MG RE SUPP: 650.0000 mg | Freq: Four times a day (QID) | RECTAL | Status: DC | PRN

## 2021-03-27 MED ORDER — SODIUM CHLORIDE 0.9 % IV SOLN
10.0000 mL/h | Freq: Once | INTRAVENOUS | Status: AC
Start: 2021-03-27 — End: 2021-03-27
  Administered 2021-03-27: 10 mL/h via INTRAVENOUS

## 2021-03-27 MED ORDER — ENOXAPARIN SODIUM 40 MG/0.4ML IJ SOSY
40.0000 mg | PREFILLED_SYRINGE | INTRAMUSCULAR | Status: DC
  Administered 2021-03-27: 40 mg via SUBCUTANEOUS
  Filled 2021-03-27: qty 0.4

## 2021-03-27 MED ORDER — SODIUM CHLORIDE 0.9 % IV SOLN
50.0000 ug/h | INTRAVENOUS | Status: DC
  Administered 2021-03-27 – 2021-03-29 (×3): 50 ug/h via INTRAVENOUS
  Filled 2021-03-27 (×8): qty 1

## 2021-03-27 MED ORDER — ACETAMINOPHEN 325 MG PO TABS: 650.0000 mg | ORAL_TABLET | Freq: Four times a day (QID) | ORAL | Status: DC | PRN

## 2021-03-27 MED ORDER — SODIUM CHLORIDE 0.9 % IV SOLN
2.0000 g | INTRAVENOUS | Status: DC
  Administered 2021-03-28 – 2021-03-30 (×3): 2 g via INTRAVENOUS
  Filled 2021-03-27 (×3): qty 20

## 2021-03-27 MED ORDER — ALBUMIN HUMAN 25 % IV SOLN
INTRAVENOUS | Status: AC
  Filled 2021-03-27: qty 50

## 2021-03-27 MED ORDER — PROCHLORPERAZINE EDISYLATE 10 MG/2ML IJ SOLN
5.0000 mg | Freq: Four times a day (QID) | INTRAMUSCULAR | Status: DC | PRN
  Administered 2021-03-27: 5 mg via INTRAVENOUS
  Filled 2021-03-27: qty 2

## 2021-03-27 MED ORDER — ALBUMIN HUMAN 25 % IV SOLN
INTRAVENOUS | Status: AC
  Filled 2021-03-27: qty 200

## 2021-03-27 MED ORDER — POTASSIUM CHLORIDE CRYS ER 20 MEQ PO TBCR
40.0000 meq | EXTENDED_RELEASE_TABLET | Freq: Once | ORAL | Status: AC
  Administered 2021-03-27: 40 meq via ORAL
  Filled 2021-03-27: qty 2

## 2021-03-27 MED ORDER — PANTOPRAZOLE SODIUM 40 MG IV SOLR
40.0000 mg | Freq: Two times a day (BID) | INTRAVENOUS | Status: DC
  Administered 2021-03-27 – 2021-03-30 (×7): 40 mg via INTRAVENOUS
  Filled 2021-03-27 (×7): qty 10

## 2021-03-27 MED ORDER — SODIUM CHLORIDE 0.9 % IV SOLN
250.0000 mg | Freq: Once | INTRAVENOUS | Status: AC
  Administered 2021-03-27: 250 mg via INTRAVENOUS
  Filled 2021-03-27 (×2): qty 20

## 2021-03-27 MED ORDER — SODIUM CHLORIDE 0.9 % IV SOLN
1.0000 g | INTRAVENOUS | Status: DC
  Administered 2021-03-27: 1 g via INTRAVENOUS
  Filled 2021-03-27: qty 10

## 2021-03-27 NOTE — ED Notes (Signed)
Radiology RN in room with pt, taking pt to radiology for procedure then to room.  Receiving RN aware.

## 2021-03-27 NOTE — H&P (Signed)
History and Physical    Patient: Erica Banks ZDG:[id number] DOB: 1980-02-15 DOA: 03/27/2021 DOS: the patient was seen and examined on 03/27/2021 PCP: Neale Burly, MD  Patient coming from: Home  Chief Complaint:  Chief Complaint  Patient presents with   Ascites    HPI: Erica Banks is a 41 year old female with a history of alcohol abuse, iron deficiency anemia, and anxiety presenting with 3-week history of increasing abdominal distention and pain.  The patient states that she has a history of alcohol usage, but quit alcohol about 6 months prior to this admission.  She used to drink at least 2 bottles of wine on a daily basis for the 10 years.  She denies any illicit drugs.  Because of the abdominal pain and swelling, the patient states that she has been taking 8 ibuprofen on a daily basis.  She has had some subjective fevers and chills.  She states that she has had some melanotic stools as well as some hematochezia.  She feels the hematochezia may be related to her hemorrhoids.  She states that she receives iron infusions every 3 months.  Her last iron infusion was in November 2022.  She has intolerance to oral iron.  She has had some mild dyspnea on exertion but denies any chest pain, hematemesis, hemoptysis.  She denies any headache, neck pain, dysuria, hematuria. In the ED, patient was afebrile hemodynamically stable with oxygen saturation 98% on room air.  WBC 5.4, hemoglobin 6.5, platelets 221,000.  Sodium 133, potassium 3.1, bicarbonate 24, serum creatinine 0.68.  AST 83, ALT 15, alk phosphatase 125, total bilirubin 3.6, INR 1.7.  CT of the abdomen and pelvis on 03/21/2021 showed new enlargement of the left lobe of the liver.  There is moderate ascites and splenomegaly.  There is a nonspecific hypervascular hypoechoic lesion in the right lobe of the liver.  The patient was admitted for symptomatic anemia/acute blood loss anemia and decompensated liver cirrhosis.   Review of Systems:  As mentioned in the history of present illness. All other systems reviewed and are negative. Past Medical History:  Diagnosis Date   Anemia    DVT (deep venous thrombosis) (HCC)    Low iron    Neuropathy    Panic attacks    Pulmonary embolus [facility])    Past Surgical History:  Procedure Laterality Date   ABDOMINAL HYSTERECTOMY     BIOPSY N/A 06/24/2013   Procedure: BIOPSY TERMINAL ILEUM;  Surgeon: Daneil Dolin, MD;  Location: [facility]ITE;  Service: Endoscopy;  Laterality: N/A;   CHOLECYSTECTOMY     COLONOSCOPY N/A 06/24/2013   VZD:GLOVFIEP hemorrhoids/otherwise normal    nasal bone surgery     NASAL SINUS SURGERY     SINUS IRRIGATION     TONSILLECTOMY     TUBAL LIGATION     Social History:  reports that she has been smoking cigarettes. She has a 12.00 pack-year smoking history. She has never used smokeless tobacco. She reports that she does not currently use alcohol after a past usage of about 4.0 - 5.0 standard drinks per week. She reports current drug use. Drug: Marijuana.  Allergies  Allergen Reactions   Demerol Anaphylaxis    Per patient cardiac arrest    Family History  Problem Relation Age of Onset   Diabetes Mother    Hypertension Mother    COPD Mother    Diabetes Sister    Hypertension Sister    Cancer Other    Crohn's disease  Maternal Grandmother    Colon cancer Neg Hx     Prior to Admission medications   Medication Sig Start Date End Date Taking? Authorizing Provider  gabapentin (NEURONTIN) 300 MG capsule Take 300 mg by mouth 3 (three) times daily. 02/25/21   [provider]  pantoprazole (PROTONIX) 40 MG tablet Take 1 tablet (40 mg total) by mouth 2 (two) times daily before a meal. Patient taking differently: Take 40 mg by mouth daily. 07/20/19   Roxan Hockey, MD    Physical Exam: Vitals:   03/27/21 0730 03/27/21 0749 03/27/21 0805 03/27/21 0830  BP: 114/73 115/72 123/68 110/73  Pulse: 97 98 99 96  Resp: 13 18 16 16   Temp:  98.4 F (36.9  C) 98.3 F (36.8 C)   TempSrc:  Oral    SpO2: 93% 98% 97% 95%  Weight:      Height:       GENERAL:  A&O x 3, NAD, well developed, cooperative, follows commands HEENT: /AT, No thrush, No icterus, No oral ulcers Neck:  No neck mass, No meningismus, soft, supple CV: RRR, no S3, no S4, no rub, no JVD Lungs:  fine bibasilar crackles, no wheeze, no rhonchi, good air movement Abd: soft/diffusely tender +BS, +distended Ext: No edema, no lymphangitis, no cyanosis, no rashes Neuro:  CN II-XII intact, strength 4/5 in RUE, RLE, strength 4/5 LUE, LLE; sensation intact bilateral; no dysmetria; babinski equivocal   Data Reviewed: Data reviewed in history  Assessment and Plan: * Acute blood loss anemia- (present on admission) Baseline hemoglobin ~8 Presented with hemoglobin 6.5 Transfused 2 units PRBC Concerned about possible UGI bleed in addition to her hemorrhoidal bleed GI consult PPI twice daily  Folate deficiency Start folic acid  Iron deficiency anemia Patient has intolerance to oral iron Iron saturation 16% Ferritin 8 Nulecit x 1  Hyponatremia Secondary to liver cirrhosis Monitor with serial BMPs  Alcoholic cirrhosis of liver with ascites (HCC) Patient has decompensated liver cirrhosis Request paracentesis Sent for cell count culture Plan to start furosemide and spironolactone once stabilized  Hypokalemia- (present on admission) Replete Check magnesium       Advance Care Planning: FULL CODE  Consults: GI  Family Communication: none present  Severity of Illness: The appropriate patient status for this patient is INPATIENT. Inpatient status is judged to be reasonable and necessary in order to provide the required intensity of service to ensure the patient's safety. The patient's presenting symptoms, physical exam findings, and initial radiographic and laboratory data in the context of their chronic comorbidities is felt to place them at high risk for further  clinical deterioration. Furthermore, it is not anticipated that the patient will be medically stable for discharge from the hospital within 2 midnights of admission.   * I certify that at the point of admission it is my clinical judgment that the patient will require inpatient hospital care spanning beyond 2 midnights from the point of admission due to high intensity of service, high risk for further deterioration and high frequency of surveillance required.*  Author: Orson Eva, MD 03/27/2021 9:02 AM  For on call review [email or web address].

## 2021-03-27 NOTE — Assessment & Plan Note (Addendum)
Baseline hemoglobin ~8 Presented with hemoglobin 6.5 Transfused 3 units PRBC Concerned about possible UGI bleed in addition to her hemorrhoidal bleed GI consult PPI twice daily Posttransfusion CBC 7.7, continue to follow Patient to have EGD today Continue on octreotide for now

## 2021-03-27 NOTE — Assessment & Plan Note (Signed)
Secondary to liver cirrhosis Monitor with serial BMPs

## 2021-03-27 NOTE — Assessment & Plan Note (Addendum)
Replete Magnesium low, will be repleted

## 2021-03-27 NOTE — ED Provider Notes (Signed)
[facility] EMERGENCY DEPARTMENT Provider Note   CSN: [id number] Arrival date & time: 03/27/21  0442     History  Chief Complaint  Patient presents with   Ascites    Erica Banks is a 41 y.o. female.  Patient is a 41 year old female with history of recently diagnosed cirrhosis.  Patient presenting today with complaints of abdominal swelling.  This has been worsening over the past 2 weeks.  The distention is at the point where she is having difficulty breathing.  Her abdomen feels tight, distended, and very uncomfortable.  She denies any bowel or bladder complaints.  She denies any fevers or chills.  Patient does have a strong history of alcohol use, but has recently stopped drinking.  The history is provided by the patient.      Home Medications Prior to Admission medications   Medication Sig Start Date End Date Taking? Authorizing Provider  gabapentin (NEURONTIN) 300 MG capsule Take 300 mg by mouth 3 (three) times daily. 02/25/21   [provider]  pantoprazole (PROTONIX) 40 MG tablet Take 1 tablet (40 mg total) by mouth 2 (two) times daily before a meal. Patient taking differently: Take 40 mg by mouth daily. 07/20/19   Shon Hale, MD      Allergies    Demerol    Review of Systems   Review of Systems  All other systems reviewed and are negative.  Physical Exam Updated Vital Signs BP 130/67    Pulse (!) 102    Temp 97.9 F (36.6 C)    Resp (!) 22    Ht 5\' 6"  (1.676 m)    Wt 70.3 kg    LMP  (LMP Unknown)    SpO2 99%    BMI 25.01 kg/m  Physical Exam Vitals and nursing note reviewed.  Constitutional:      General: She is not in acute distress.    Appearance: She is well-developed. She is not diaphoretic.  HENT:     Head: Normocephalic and atraumatic.  Cardiovascular:     Rate and Rhythm: Normal rate and regular rhythm.     Heart sounds: No murmur heard.   No friction rub. No gallop.  Pulmonary:     Effort: Pulmonary effort is normal. No respiratory  distress.     Breath sounds: Normal breath sounds. No wheezing.  Abdominal:     General: Bowel sounds are normal. There is distension.     Palpations: Abdomen is soft.     Tenderness: There is abdominal tenderness. There is no guarding or rebound.     Comments: There is abdominal distention noted with positive fluid wave.  Musculoskeletal:        General: Normal range of motion.     Cervical back: Normal range of motion and neck supple.  Skin:    General: Skin is warm and dry.  Neurological:     General: No focal deficit present.     Mental Status: She is alert and oriented to person, place, and time.    ED Results / Procedures / Treatments   Labs (all labs ordered are listed, but only abnormal results are displayed) Labs Reviewed - No data to display  EKG None  Radiology No results found.  Procedures Procedures    Medications Ordered in ED Medications - No data to display  ED Course/ Medical Decision Making/ A&P  This patient presents to the ED for concern of abdominal distention, this involves an extensive number of treatment options, and is  a complaint that carries with it a high risk of complications and morbidity.  The differential diagnosis includes ascites, SBP   Co morbidities that complicate the patient evaluation  Recently diagnosed cirrhosis and history of alcohol abuse   Additional history obtained:  No additional history or outside records obtained   Lab Tests:  I Ordered, and personally interpreted labs.  The pertinent results include: CBC showing anemia with hemoglobin of 6.5.  She also has hypoalbuminemia and total bilirubin of 3.6.  INR is 1.7   Imaging Studies ordered:  I ordered imaging studies including ultrasound-guided paracentesis, the results of which are pending   Cardiac Monitoring:  The patient was maintained on a cardiac monitor.  I personally viewed and interpreted the cardiac monitored which showed an underlying rhythm of:  Sinus rhythm   Medicines ordered and prescription drug management:  No medications given I have reviewed the patients home medicines and have made adjustments as needed   Test Considered:  No other test considered   Critical Interventions:  Transfusion of 2 units packed red blood cells and ultrasound-guided paracentesis to be performed by radiology   Consultations Obtained:  I requested consultation with the hospitalist, Dr. Thomes Dinning,  and discussed lab and imaging findings as well as pertinent plan - they recommend: Paracentesis and blood transfusion, but feels as though patient can safely be discharged after these procedures   Problem List / ED Course:  Patient with recently diagnosed cirrhosis, now with large volume ascites.  She is having abdominal distention causing significant discomfort and and difficulty breathing.  On exam, she has fluid wave and some tenderness to palpation throughout. She has no fever and no white count and I doubt peritonitis, but samples will be obtained when paracentesis is performed. Patient will be transfused 2 units of packed red cells as her hemoglobin has dropped to 6.5. Care was discussed with Dr. Thomes Dinning from the hospitalist service.  He feels as though this transfusion and paracentesis can be performed as an ER patient and does not feel as though patient requires admission.  She will be observed here in the ER while receiving her blood and paracentesis.  If all goes well, patient will hopefully go home later this morning.    Reevaluation:  After the interventions noted above, I reevaluated the patient and found that they have : Stayed the same   Social Determinants of Health:  History of alcohol abuse, however reports having stopped drinking recently   Dispostion:  After consideration of the diagnostic results and the patients response to treatment, I feel that the patent would benefit from ultrasound paracentesis and blood  transfusion.  These procedures have been ordered and patient will be signed out to oncoming provider at shift change.  CRITICAL CARE Performed by: Geoffery Lyons Total critical care time: 40 minutes Critical care time was exclusive of separately billable procedures and treating other patients. Critical care was necessary to treat or prevent imminent or life-threatening deterioration. Critical care was time spent personally by me on the following activities: development of treatment plan with patient and/or surrogate as well as nursing, discussions with consultants, evaluation of patient's response to treatment, examination of patient, obtaining history from patient or surrogate, ordering and performing treatments and interventions, ordering and review of laboratory studies, ordering and review of radiographic studies, pulse oximetry and re-evaluation of patient's condition.   Final Clinical Impression(s) / ED Diagnoses Final diagnoses:  None    Rx / DC Orders ED Discharge Orders  None         Geoffery Lyons, MD 03/27/21 [id number]

## 2021-03-27 NOTE — Progress Notes (Signed)
I was called to admit this patient who presents to the emergency department due to abdominal distention and discomfort.  She was recently diagnosed with cirrhosis.  Recent CT done showed ascites which has since progressed and now presents with abdomen which feels tight, distended and uncomfortable.  Patient denies any history of alcohol use, though she recently quit drinking.  Hemoglobin was 6.5. After discussion with the ED physician, it was agreed that IR can be consulted from the ED and that patient could receive blood transfusion in the ED and possibly be discharged home provided that she continues to remain stable, otherwise, the ED physician will resend consult to the flow manager for [facility] admission.

## 2021-03-27 NOTE — ED Triage Notes (Signed)
Pt c/o ascites. States that she was sent for a CT abdomen on 2/22 before hernia repair. CT showed enlarged liver and slight ascites. Pt states she has been waiting for GI to call her after her referral. Pt states that swelling is worse and is starting to make her feel SOB.

## 2021-03-27 NOTE — ED Notes (Signed)
Admitting MD at bedside.

## 2021-03-27 NOTE — H&P (View-Only) (Signed)
_0 @   Referring Provider: [facility] hospitalist  Primary Care Physician:  Neale Burly, MD Primary Gastroenterologist:  Dr. Gala Romney  Date of Admission: 03/27/21 Date of Consultation: 03/27/21  Reason for Consultation: Anemia, melena, decompensated cirrhosis  HPI:  Erica Banks is a 41 y.o. year old female  with a history of alcohol abuse, iron deficiency anemia, and anxiety presenting with 3-week history of increasing abdominal distention and pain.  Also reported melanotic stools and some hematochezia.  ED course: Hemodynamically stable. WBC 5.4, hemoglobin 6.5, platelets 221,000.  Sodium 133, potassium 3.1, bicarbonate 24, serum creatinine 0.68.  AST 83, ALT 15, alk phosphatase 125, total bilirubin 3.6, INR 1.7.   *Prior CT A/P with contrast 03/21/2021 with new enlargement of lateral segment left lobe liver, moderate ascites, splenomegaly, findings highly suggestive of cirrhosis.  Nonspecific hypervascular and hypoechoic lesions within right lobe liver, new compared to prior study, recommended follow-up MRI to exclude tumor.  The patient was admitted for symptomatic anemia/acute blood loss anemia and decompensated liver cirrhosis.  2 units PRBCs ordered.  Started on IV PPI twice daily.  GI consulted for further evaluation and management.   Consult:  Abdominal swelling had been intermittent for the last 2-3 months, but became progressive over the last 3 weeks. Now associated with abdominal pain, hurts to walk and move around. Pain started over the last week. Cold chills, no fever. No nausea or vomiting. Has been taking 8 ibuprofen daily for abdominal pain for the last 1-2 weeks. No mental status changes.   Has had black stools for the last 2 weeks. Bowels move every couple of days since abdominal distension. Loose. Some bright red blood in the toilet water when she has a bowel movement when having to strain.  This is a chronic issue for her.  Reports external hemorrhoids for years.  Last BM  was 2 days ago. Has chronic reflux/heartburn, on Protonix 40 mg daily for years which keeps this well controlled. No dysphagia.   No known history of cirrhosis prior to this. Was told she has fatty liver in the past. Has had elevated liver enzymes.   Quit drinking alcohol about 6 months ago.  Used to drink at least 2 bottles of wine daily for the last 10 years.   Denies illicit drug use. Marijuana occasionally.  Acute hepatitis panel negative in July 2022.  She has had chronic anemia at least since 2017. Was having menorrhagia, but had hysterectomy in 2020. Has had blood transfusions, last was prior to her hysterectomy. Receives IV iron every 3-6 months, last infusion in November 2022.  Intolerant to oral iron.    Had some weight loss over the summer when she was sick from her gallbladder. Weight has been stable since then. Eating well.   TCS in 2015: External hemorrhoids. Normal ileocolonoscopy-status post biopsy of the terminal ileum.  Pathology was superficial intestinal mucosal fragments. Reports TCS since then.  No prior EGD.   Past Medical History:  Diagnosis Date   Anemia    DVT (deep venous thrombosis) (HCC)    Low iron    Neuropathy    Panic attacks    Pulmonary embolus [facility])     Past Surgical History:  Procedure Laterality Date   ABDOMINAL HYSTERECTOMY     BIOPSY N/A 06/24/2013   Procedure: BIOPSY TERMINAL ILEUM;  Surgeon: Daneil Dolin, MD;  Location: [facility];  Service: Endoscopy;  Laterality: N/A;   CHOLECYSTECTOMY     COLONOSCOPY N/A 06/24/2013   [id number]  hemorrhoids/otherwise normal    nasal bone surgery     NASAL SINUS SURGERY     SINUS IRRIGATION     TONSILLECTOMY     TUBAL LIGATION      Prior to Admission medications   Medication Sig Start Date End Date Taking? Authorizing Provider  gabapentin (NEURONTIN) 300 MG capsule Take 300 mg by mouth 3 (three) times daily. 02/25/21   [provider]  pantoprazole (PROTONIX) 40 MG tablet Take 1  tablet (40 mg total) by mouth 2 (two) times daily before a meal. Patient taking differently: Take 40 mg by mouth daily. 07/20/19   Roxan Hockey, MD    Current Facility-Administered Medications  Medication Dose Route Frequency Provider Last Rate Last Admin   0.9 %  sodium chloride infusion  10 mL/hr Intravenous Once Veryl Speak, MD       0.9 % NaCl with KCl 20 mEq/ L  infusion   Intravenous Continuous Tat, Shanon Brow, MD       acetaminophen (TYLENOL) tablet 650 mg  650 mg Oral Q6H PRN Tat, Shanon Brow, MD       Or   acetaminophen (TYLENOL) suppository 650 mg  650 mg Rectal Q6H PRN Tat, Shanon Brow, MD       enoxaparin (LOVENOX) injection 40 mg  40 mg Subcutaneous Q24H Tat, David, MD       ferric gluconate (FERRLECIT) 250 mg in sodium chloride 0.9 % 250 mL IVPB  250 mg Intravenous Once Tat, Shanon Brow, MD       folic acid (FOLVITE) tablet 1 mg  1 mg Oral Daily Tat, Shanon Brow, MD       gabapentin (NEURONTIN) capsule 300 mg  300 mg Oral TID Tat, Shanon Brow, MD       ondansetron [facility]) tablet 4 mg  4 mg Oral Q6H PRN Tat, Shanon Brow, MD       Or   ondansetron (ZOFRAN) injection 4 mg  4 mg Intravenous Q6H PRN Tat, Shanon Brow, MD       pantoprazole (PROTONIX) injection 40 mg  40 mg Intravenous Q12H Tat, Shanon Brow, MD       potassium chloride SA (KLOR-CON M) CR tablet 40 mEq  40 mEq Oral Once Tat, Shanon Brow, MD       prochlorperazine (COMPAZINE) injection 5 mg  5 mg Intravenous Q6H PRN Tat, Shanon Brow, MD       Current Outpatient Medications  Medication Sig Dispense Refill   gabapentin (NEURONTIN) 300 MG capsule Take 300 mg by mouth 3 (three) times daily.     pantoprazole (PROTONIX) 40 MG tablet Take 1 tablet (40 mg total) by mouth 2 (two) times daily before a meal. (Patient taking differently: Take 40 mg by mouth daily.) 60 tablet 3    Allergies as of 03/27/2021 - Review Complete 03/27/2021  Allergen Reaction Noted   Demerol Anaphylaxis 10/12/2010    Family History  Problem Relation Age of Onset   Diabetes Mother    Hypertension  Mother    COPD Mother    Diabetes Sister    Hypertension Sister    Cancer Other    Crohn's disease Maternal Grandmother    Colon cancer Neg Hx     Social History   Socioeconomic History   Marital status: Married    Spouse name: Not on file   Number of children: Not on file   Years of education: Not on file   Highest education level: Not on file  Occupational History   Occupation: Unemployed    Comment: wants to go  to nursing school in fall 2015  Tobacco Use   Smoking status: Every Day    Packs/day: 1.00    Years: 12.00    Pack years: 12.00    Types: Cigarettes   Smokeless tobacco: Never  Vaping Use   Vaping Use: Never used  Substance and Sexual Activity   Alcohol use: Not Currently    Alcohol/week: 4.0 - 5.0 standard drinks    Types: 4 - 5 Cans of beer per week    Comment: occ   Drug use: Yes    Types: Marijuana    Comment: occasional   Sexual activity: Yes    Birth control/protection: Surgical  Other Topics Concern   Not on file  Social History Narrative   Not on file   Social Determinants of Health   Financial Resource Strain: Not on file  Food Insecurity: Not on file  Transportation Needs: Not on file  Physical Activity: Not on file  Stress: Not on file  Social Connections: Not on file  Intimate Partner Violence: Not on file    Review of Systems: Gen: Denies presyncope or syncope. CV: Denies chest pain, heart palpitations. Resp: Admits to mild shortness of breath.  No cough. GI: See HPI GU : Denies urinary burning, urinary frequency, urinary incontinence.  MS: Denies joint pain. Derm: Denies rash. Psych: Denies depression, anxiety. Heme: See HPI  Physical Exam: Vital signs in last 24 hours: Temp:  [97.9 F (36.6 C)-98.4 F (36.9 C)] 98.3 F (36.8 C) (02/28 0805) Pulse Rate:  [95-102] 99 (02/28 0900) Resp:  [13-22] 20 (02/28 0900) BP: (105-130)/(67-75) 120/71 (02/28 0900) SpO2:  [91 %-99 %] 95 % (02/28 0900) Weight:  [70.3 kg] 70.3 kg  (02/28 0455)   General:   Alert,  Well-developed, well-nourished, pleasant and cooperative in NAD Head:  Normocephalic and atraumatic. Eyes:  Sclera clear, no icterus.   Conjunctiva pink. Ears:  Normal auditory acuity. Lungs:  Clear throughout to auscultation.   No wheezes, crackles, or rhonchi. No acute distress. Heart:  Regular rate and rhythm; no murmurs, clicks, rubs,  or gallops. Abdomen: Distended with tense ascites.  Mild generalized tenderness to palpation.  Difficult to assess for hepatosplenomegaly.  Small, soft, reducible hernia in the epigastric area.  Normal bowel sounds.  Without guarding, and without rebound.   Rectal: Significant external hemorrhoids.  No masses appreciated internally.  No melena.  Scant red blood on gloved exam finger.  Hemoccult positive. Msk:  Symmetrical without gross deformities. Normal posture. Extremities:  Without edema. Neurologic:  Alert and  oriented x4;  grossly normal neurologically. Skin:  Intact without significant lesions or rashes. Psych:  Normal mood and affect.  Intake/Output from previous day: No intake/output data recorded. Intake/Output this shift: No intake/output data recorded.  Lab Results: Recent Labs    03/27/21 0516  WBC 5.4  HGB 6.5*  HCT 21.3*  PLT 221   BMET Recent Labs    03/27/21 0516  NA 133*  K 3.1*  CL 100  CO2 24  GLUCOSE 114*  BUN 6  CREATININE 0.68  CALCIUM 8.0*   LFT Recent Labs    03/27/21 0516  PROT 6.2*  ALBUMIN 2.2*  AST 83*  ALT 15  ALKPHOS 125  BILITOT 3.6*   PT/INR Recent Labs    03/27/21 0516  LABPROT 20.1*  INR 1.7*   Impression: 41 year old female with history of alcohol abuse, chronic anemia receiving iron infusions per hematology, anxiety, who presented with 3-week history of increasing abdominal distention, abdominal  pain, and also reported melanotic stools and some hematochezia.  Recent CT on 03/21/2021 with findings highly suggestive of cirrhosis with moderate ascites  and splenomegaly, new enlargement of lateral segment left lobe liver.  Also with new, nonspecific hypervascular hypoechoic lesions within right lobe liver.  In the ED, she was found to have a hemoglobin of 6.5, elevated AST, bilirubin, INR, admitted with acute on chronic anemia and decompensated cirrhosis, GI consulted for further evaluation and management.  Decompensated Cirrhosis:  New diagnosis. Previously with hepatic steatosis on imaging in 2021. MELD Na 20.  Decompensated with tense ascites with associated abdominal pain.  No encephalopathy. Associated elevated AST (83) and bilirubin (3.6). Etiology not entirely clear.  She does have history of chronic alcohol abuse, but quit drinking about 6 months ago.  No history of IV or intranasal drug use. Recent CT 2/22 with nonspecific hypervascular hypoechoic lesions within right lobe of uncertain etiology with recommendations for MRI. No prior EGD. She will get paracentesis today with fluid analysis. Will need to start diuretics once anemia is corrected (addressed below) and soft BPs improve.  We will also complete additional serologic work-up to evaluate etiology of cirrhosis, check AFP, update [place] and obtain doppler study due to acute decompensation with ascites.  Ultimately, she will need MRI with liver protocol, possibly outpatient if AFP is normal.   Acute on chronic anemia:  Chronic IDA with hemoglobin typically in the 8-10 range, receiving IV iron with hematology.  Previously with significant menorrhagia s/p hysterectomy in 2020.  Hemoglobin 9.5 in January 2023, 6.5 on admission. 2 units PRBCs have been ordered. Reports black stools for the last 2 weeks, with last episode 2 days ago.  Also with chronic intermittent bright red blood per rectum in the setting of external hemorrhoids.  Has been taking 8 ibuprofen daily for abdominal pain for the last couple of weeks. Iron panel with ferritin 8, iron 27, saturation 16%.  Folate low at 4.1.  B12 elevated.   Last colonoscopy on file in 2015 with external hemorrhoids, normal TI.  No prior EGD.  She will need EGD to evaluate for esophageal varices/variceal bleed, gastric varices, portal gastropathy, PUD, gastritis, duodenitis.  Consider updating a colonoscopy if EGD is unrevealing.  We will start octreotide infusion, continue IV PPI twice daily, and start Rocephin for SBP prophylaxis.   Generalized abdominal pain:  Likely secondary to ascites, unable to rule out SBP. May also be influenced by possible gastritis, or PUD in the setting of dark stools and daily ibuprofen recently.    Plan: Agree with transfusing 2 units prbcs.  Continue to monitor H/H and transfuse for Hgb less than 7.  Needs EGD with propofol, likely tomorrow. Will discuss with Dr. Jenetta Downer.  Can consider updating colonoscopy if EGD is unrevealing.  US abdomen complete with liver doppler.  Octreotide drip Continue IV PPI BID Agree with paracentesis today. Follow-up on fluid analysis.  We will need to start diuretics.  Holding off for now in the setting of anemia and soft BP. Rocephin 1 g daily for SBP prophylaxis.  AFP, ANA, AMA, ASMA, immunoglobulins, acute hepatitis panel, ceruloplasmin. Will need MRI with liver protocol, possibly outpatient.  Continue folate supplementation.     LOS: 0 days    03/27/2021, 9:32 AM   Aliene Altes, PA-C [facility]

## 2021-03-27 NOTE — Procedures (Signed)
PreOperative Dx: ascites Postoperative Dx: ascites Procedure:   US guided paracentesis Radiologist:  Tyron Russell Anesthesia:  10 ml of1% lidocaine Specimen:  3.5 L of yellow ascitic fluid EBL:   < 1 ml Complications: none

## 2021-03-27 NOTE — Assessment & Plan Note (Signed)
Patient has intolerance to oral iron Iron saturation 16% Ferritin 8 Nulecit x 1

## 2021-03-27 NOTE — Progress Notes (Signed)
PT tolerated left sided paracentesis procedure well today and 3.2 Liters of clear yellow fluid removed with labs collected and sent for processing. PT verbalized understanding of post procedure instructions and transported to inpatient bed assignment at this time with NAD.

## 2021-03-27 NOTE — Assessment & Plan Note (Addendum)
Patient has decompensated liver cirrhosis She had paracentesis on 2/28 with removal of 3.5L fluid Culture has not shown any specific growth, although Gram stain does comment on gram-positive cocci She has been started empirically on ceftriaxone for SBP coverage, pending final culture results Now started on furosemide and aldactone Can consider starting NSBB Will consider having repeat paracentesis tomorrow if abd continues to have increased distention

## 2021-03-27 NOTE — Hospital Course (Signed)
41 year old female with a history of alcohol abuse, iron deficiency anemia, and anxiety presenting with 3-week history of increasing abdominal distention and pain.  The patient states that she has a history of alcohol usage, but quit alcohol about 6 months prior to this admission.  She used to drink at least 2 bottles of wine on a daily basis for the 10 years.  She denies any illicit drugs.  Because of the abdominal pain and swelling, the patient states that she has been taking 8 ibuprofen on a daily basis.  She has had some subjective fevers and chills.  She states that she has had some melanotic stools as well as some hematochezia.  She feels the hematochezia may be related to her hemorrhoids.  She states that she receives iron infusions every 3 months.  Her last iron infusion was in November 2022.  She has intolerance to oral iron.  She has had some mild dyspnea on exertion but denies any chest pain, hematemesis, hemoptysis.  She denies any headache, neck pain, dysuria, hematuria. In the ED, patient was afebrile hemodynamically stable with oxygen saturation 98% on room air.  WBC 5.4, hemoglobin 6.5, platelets 221,000.  Sodium 133, potassium 3.1, bicarbonate 24, serum creatinine 0.68.  AST 83, ALT 15, alk phosphatase 125, total bilirubin 3.6, INR 1.7.  CT of the abdomen and pelvis on 03/21/2021 showed new enlargement of the left lobe of the liver.  There is moderate ascites and splenomegaly.  There is a nonspecific hypervascular hypoechoic lesion in the right lobe of the liver.  The patient was admitted for symptomatic anemia/acute blood loss anemia and decompensated liver cirrhosis.

## 2021-03-27 NOTE — Assessment & Plan Note (Signed)
Start  folic acid

## 2021-03-27 NOTE — ED Notes (Signed)
Pt gone to US

## 2021-03-27 NOTE — Consult Note (Signed)
@LOGO@ ° ° °Referring Provider: [facility] hospital[facility]  °Primary Care Physician:  Hasanaj, Xaje A, MD °Primary Gastroenterologist:  Dr. Rourk ° °Date of Admission: 03/27/21 °Date of Consultation: 03/27/21 ° °Reason for Consultation: Anemia, melena, decompensated cirrhosis ° °HPI:  °Erica Banks is a 40 y.o. year old female  with a history of alcohol abuse, iron deficiency anemia, and anxiety presenting with 3-week history of increasing abdominal distention and pain.  Also reported melanotic stools and some hematochezia. ° °ED course: °Hemodynamically stable. °WBC 5.4, hemoglobin 6.5, platelets 221,000.  Sodium 133, potassium 3.1, bicarbonate 24, serum creatinine 0.68.  AST 83, ALT 15, alk phosphatase 125, total bilirubin 3.6, INR 1.7.   °*Prior CT A/P with contrast 03/21/2021 with new enlargement of lateral segment left lobe liver, moderate ascites, splenomegaly, findings highly suggestive of cirrhosis.  Nonspecific hypervascular and hypoechoic lesions within right lobe liver, new compared to prior study, recommended follow-up MRI to exclude tumor. ° °The patient was admitted for symptomatic anemia/acute blood loss anemia and decompensated liver cirrhosis.  2 units PRBCs ordered.  Started on IV PPI twice daily.  GI consulted for further evaluation and management.  ° °Consult:  °Abdominal swelling had been intermittent for the last 2-3 months, but became progressive over the last 3 weeks. Now associated with abdominal pain, hurts to walk and move around. Pain started over the last week. Cold chills, no fever. No nausea or vomiting. Has been taking 8 ibuprofen daily for abdominal pain for the last 1-2 weeks. No mental status changes.  ° °Has had black stools for the last 2 weeks. Bowels move every couple of days since abdominal distension. Loose. Some bright red blood in the toilet water when she has a bowel movement when having to strain.  This is a chronic issue for her.  Reports external hemorrhoids for years.  Last BM  was 2 days ago. Has chronic reflux/heartburn, on Protonix 40 mg daily for years which keeps this well controlled. No dysphagia.  ° °No known history of cirrhosis prior to this. Was told she has fatty liver in the past. Has had elevated liver enzymes.  ° °Quit drinking alcohol about 6 months ago.  Used to drink at least 2 bottles of wine daily for the last 10 years.   °Denies illicit drug use. Marijuana occasionally.  °Acute hepatitis panel negative in July 2022. ° °She has had chronic anemia at least since 2017. Was having menorrhagia, but had hysterectomy in 2020. Has had blood transfusions, last was prior to her hysterectomy. Receives IV iron every 3-6 months, last infusion in November 2022.  Intolerant to oral iron.   ° °Had some weight loss over the summer when she was sick from her gallbladder. Weight has been stable since then. Eating well.  ° °TCS in 2015: External hemorrhoids. Normal ileocolonoscopy-status post biopsy of the terminal ileum.  Pathology was superficial intestinal mucosal fragments. °Reports TCS since then.  °No prior EGD.  ° °Past Medical History:  °Diagnosis Date  ° Anemia   ° DVT (deep venous thrombosis) (HCC)   ° Low iron   ° Neuropathy   ° Panic attacks   ° Pulmonary embolus (HCC)   ° ° °Past Surgical History:  °Procedure Laterality Date  ° ABDOMINAL HYSTERECTOMY    ° BIOPSY N/A 06/24/2013  ° Procedure: BIOPSY TERMINAL ILEUM;  Surgeon: Robert M Rourk, MD;  Location: [facility];  Service: Endoscopy;  Laterality: N/A;  ° CHOLECYSTECTOMY    ° COLONOSCOPY N/A 06/24/2013  ° RMR:External   hemorrhoids/otherwise normal   ° nasal bone surgery    ° NASAL SINUS SURGERY    ° SINUS IRRIGATION    ° TONSILLECTOMY    ° TUBAL LIGATION    ° ° °Prior to Admission medications   °Medication Sig Start Date End Date Taking? Authorizing Provider  °gabapentin (NEURONTIN) 300 MG capsule Take 300 mg by mouth 3 (three) times daily. 02/25/21   [provider]  °pantoprazole (PROTONIX) 40 MG tablet Take 1  tablet (40 mg total) by mouth 2 (two) times daily before a meal. °Patient taking differently: Take 40 mg by mouth daily. 07/20/19   Emokpae, Courage, MD  ° ° °Current Facility-Administered Medications  °Medication Dose Route Frequency Provider Last Rate Last Admin  ° 0.9 %  sodium chloride infusion  10 mL/hr Intravenous Once Delo, Douglas, MD      ° 0.9 % NaCl with KCl 20 mEq/ L  infusion   Intravenous Continuous Tat, David, MD      ° acetaminophen (TYLENOL) tablet 650 mg  650 mg Oral Q6H PRN Tat, David, MD      ° Or  ° acetaminophen (TYLENOL) suppository 650 mg  650 mg Rectal Q6H PRN Tat, David, MD      ° enoxaparin (LOVENOX) injection 40 mg  40 mg Subcutaneous Q24H Tat, David, MD      ° ferric gluconate (FERRLECIT) 250 mg in sodium chloride 0.9 % 250 mL IVPB  250 mg Intravenous Once Tat, David, MD      ° folic acid (FOLVITE) tablet 1 mg  1 mg Oral Daily Tat, David, MD      ° gabapentin (NEURONTIN) capsule 300 mg  300 mg Oral TID Tat, David, MD      ° ondansetron (ZOFRAN) tablet 4 mg  4 mg Oral Q6H PRN Tat, David, MD      ° Or  ° ondansetron (ZOFRAN) injection 4 mg  4 mg Intravenous Q6H PRN Tat, David, MD      ° pantoprazole (PROTONIX) injection 40 mg  40 mg Intravenous Q12H Tat, David, MD      ° potassium chloride SA (KLOR-CON M) CR tablet 40 mEq  40 mEq Oral Once Tat, David, MD      ° prochlorperazine (COMPAZINE) injection 5 mg  5 mg Intravenous Q6H PRN Tat, David, MD      ° °Current Outpatient Medications  °Medication Sig Dispense Refill  ° gabapentin (NEURONTIN) 300 MG capsule Take 300 mg by mouth 3 (three) times daily.    ° pantoprazole (PROTONIX) 40 MG tablet Take 1 tablet (40 mg total) by mouth 2 (two) times daily before a meal. (Patient taking differently: Take 40 mg by mouth daily.) 60 tablet 3  ° ° °Allergies as of 03/27/2021 - Review Complete 03/27/2021  °Allergen Reaction Noted  ° Demerol Anaphylaxis 10/12/2010  ° ° °Family History  °Problem Relation Age of Onset  ° Diabetes Mother   ° Hypertension  Mother   ° COPD Mother   ° Diabetes Sister   ° Hypertension Sister   ° Cancer Other   ° Crohn's disease Maternal Grandmother   ° Colon cancer Neg Hx   ° ° °Social History  ° °Socioeconomic History  ° Marital status: Married  °  Spouse name: Not on file  ° Number of children: Not on file  ° Years of education: Not on file  ° Highest education level: Not on file  °Occupational History  ° Occupation: Unemployed  °  Comment: wants to go   to nursing school in fall 2015  °Tobacco Use  ° Smoking status: Every Day  °  Packs/day: 1.00  °  Years: 12.00  °  Pack years: 12.00  °  Types: Cigarettes  ° Smokeless tobacco: Never  °Vaping Use  ° Vaping Use: Never used  °Substance and Sexual Activity  ° Alcohol use: Not Currently  °  Alcohol/week: 4.0 - 5.0 standard drinks  °  Types: 4 - 5 Cans of beer per week  °  Comment: occ  ° Drug use: Yes  °  Types: Marijuana  °  Comment: occasional  ° Sexual activity: Yes  °  Birth control/protection: Surgical  °Other Topics Concern  ° Not on file  °Social History Narrative  ° Not on file  ° °Social Determinants of Health  ° °Financial Resource Strain: Not on file  °Food Insecurity: Not on file  °Transportation Needs: Not on file  °Physical Activity: Not on file  °Stress: Not on file  °Social Connections: Not on file  °Intimate Partner Violence: Not on file  ° ° °Review of Systems: °Gen: Denies presyncope or syncope. °CV: Denies chest pain, heart palpitations. °Resp: Admits to mild shortness of breath.  No cough. °GI: See HPI °GU : Denies urinary burning, urinary frequency, urinary incontinence.  °MS: Denies joint pain. °Derm: Denies rash. °Psych: Denies depression, anxiety. °Heme: See HPI ° °Physical Exam: °Vital signs in last 24 hours: °Temp:  [97.9 °F (36.6 °C)-98.4 °F (36.9 °C)] 98.3 °F (36.8 °C) (02/28 0805) °Pulse Rate:  [95-102] 99 (02/28 0900) °Resp:  [13-22] 20 (02/28 0900) °BP: (105-130)/(67-75) 120/71 (02/28 0900) °SpO2:  [91 %-99 %] 95 % (02/28 0900) °Weight:  [70.3 kg] 70.3 kg  (02/28 0455) °  °General:   Alert,  Well-developed, well-nourished, pleasant and cooperative in NAD °Head:  Normocephalic and atraumatic. °Eyes:  Sclera clear, no icterus.   Conjunctiva pink. °Ears:  Normal auditory acuity. °Lungs:  Clear throughout to auscultation.   No wheezes, crackles, or rhonchi. No acute distress. °Heart:  Regular rate and rhythm; no murmurs, clicks, rubs,  or gallops. °Abdomen: Distended with tense ascites.  Mild generalized tenderness to palpation.  Difficult to assess for hepatosplenomegaly.  Small, soft, reducible hernia in the epigastric area.  Normal bowel sounds.  Without guarding, and without rebound.   °Rectal: Significant external hemorrhoids.  No masses appreciated internally.  No melena.  Scant red blood on gloved exam finger.  Hemoccult positive. °Msk:  Symmetrical without gross deformities. Normal posture. °Extremities:  Without edema. °Neurologic:  Alert and  oriented x4;  grossly normal neurologically. °Skin:  Intact without significant lesions or rashes. °Psych:  Normal mood and affect. ° °Intake/Output from previous day: °No intake/output data recorded. °Intake/Output this shift: °No intake/output data recorded. ° °Lab Results: °Recent Labs  °  03/27/21 °0516  °WBC 5.4  °HGB 6.5*  °HCT 21.3*  °PLT 221  ° °BMET °Recent Labs  °  03/27/21 °0516  °NA 133*  °K 3.1*  °CL 100  °CO2 24  °GLUCOSE 114*  °BUN 6  °CREATININE 0.68  °CALCIUM 8.0*  ° °LFT °Recent Labs  °  03/27/21 °0516  °PROT 6.2*  °ALBUMIN 2.2*  °AST 83*  °ALT 15  °ALKPHOS 125  °BILITOT 3.6*  ° °PT/INR °Recent Labs  °  03/27/21 °0516  °LABPROT 20.1*  °INR 1.7*  ° °Impression: °40-year-old female with history of alcohol abuse, chronic anemia receiving iron infusions per hematology, anxiety, who presented with 3-week history of increasing abdominal distention, abdominal   pain, and also reported melanotic stools and some hematochezia.  Recent CT on 03/21/2021 with findings highly suggestive of cirrhosis with moderate ascites  and splenomegaly, new enlargement of lateral segment left lobe liver.  Also with new, nonspecific hypervascular hypoechoic lesions within right lobe liver.  In the ED, she was found to have a hemoglobin of 6.5, elevated AST, bilirubin, INR, admitted with acute on chronic anemia and decompensated cirrhosis, GI consulted for further evaluation and management. ° °Decompensated Cirrhosis:  °New diagnosis. Previously with hepatic steatosis on imaging in 2021. MELD Na 20.  Decompensated with tense ascites with associated abdominal pain.  No encephalopathy. Associated elevated AST (83) and bilirubin (3.6). Etiology not entirely clear.  She does have history of chronic alcohol abuse, but quit drinking about 6 months ago.  No history of IV or intranasal drug use. Recent CT 2/22 with nonspecific hypervascular hypoechoic lesions within right lobe of uncertain etiology with recommendations for MRI. No prior EGD. She will get paracentesis today with fluid analysis. Will need to start diuretics once anemia is corrected (addressed below) and soft BPs improve.  We will also complete additional serologic work-up to evaluate etiology of cirrhosis, check AFP, update US and obtain doppler study due to acute decompensation with ascites.  Ultimately, she will need MRI with liver protocol, possibly outpatient if AFP is normal.  ° °Acute on chronic anemia:  °Chronic IDA with hemoglobin typically in the 8-10 range, receiving IV iron with hematology.  Previously with significant menorrhagia s/p hysterectomy in 2020.  Hemoglobin 9.5 in January 2023, 6.5 on admission. 2 units PRBCs have been ordered. Reports black stools for the last 2 weeks, with last episode 2 days ago.  Also with chronic intermittent bright red blood per rectum in the setting of external hemorrhoids.  Has been taking 8 ibuprofen daily for abdominal pain for the last couple of weeks. Iron panel with ferritin 8, iron 27, saturation 16%.  Folate low at 4.1.  B12 elevated.   Last colonoscopy on file in 2015 with external hemorrhoids, normal TI.  No prior EGD. ° °She will need EGD to evaluate for esophageal varices/variceal bleed, gastric varices, portal gastropathy, PUD, gastritis, duodenitis.  Consider updating a colonoscopy if EGD is unrevealing.  We will start octreotide infusion, continue IV PPI twice daily, and start Rocephin for SBP prophylaxis.  ° °Generalized abdominal pain:  °Likely secondary to ascites, unable to rule out SBP. May also be influenced by possible gastritis, or PUD in the setting of dark stools and daily ibuprofen recently.  ° ° °Plan: °Agree with transfusing 2 units prbcs.  °Continue to monitor H/H and transfuse for Hgb less than 7.  °Needs EGD with propofol, likely tomorrow. Will discuss with Dr. Castaneda.  °Can consider updating colonoscopy if EGD is unrevealing.  °US abdomen complete with liver doppler.  °Octreotide drip °Continue IV PPI BID °Agree with paracentesis today. Follow-up on fluid analysis.  °We will need to start diuretics.  Holding off for now in the setting of anemia and soft BP. °Rocephin 1 g daily for SBP prophylaxis.  °AFP, ANA, AMA, ASMA, immunoglobulins, acute hepatitis panel, ceruloplasmin. °Will need MRI with liver protocol, possibly outpatient.  °Continue folate supplementation.  ° ° ° LOS: 0 days  ° ° 03/27/2021, 9:32 AM ° ° °Vedanshi Massaro, PA-C °[facility]   °

## 2021-03-28 ENCOUNTER — Encounter: Payer: Self-pay | Admitting: Internal Medicine

## 2021-03-28 ENCOUNTER — Inpatient Hospital Stay (HOSPITAL_COMMUNITY): Payer: Medicaid Other | Admitting: Anesthesiology

## 2021-03-28 ENCOUNTER — Encounter (HOSPITAL_COMMUNITY)
Admission: EM | Disposition: A | Discharge status: Home / Self Care | Payer: Self-pay | Source: Home / Self Care | Attending: Internal Medicine

## 2021-03-28 LAB — CBC
HCT: 22.3 % — ABNORMAL LOW (ref 36.0–46.0)
Hemoglobin: 6.9 g/dL — CL (ref 12.0–15.0)
MCH: 29.4 pg (ref 26.0–34.0)
MCHC: 30.9 g/dL (ref 30.0–36.0)
MCV: 94.9 fL (ref 80.0–100.0)
Platelets: 165 10*3/uL (ref 150–400)
RBC: 2.35 MIL/uL — ABNORMAL LOW (ref 3.87–5.11)
RDW: 17.2 % — ABNORMAL HIGH (ref 11.5–15.5)
WBC: 4.4 10*3/uL (ref 4.0–10.5)
nRBC: 0 % (ref 0.0–0.2)

## 2021-03-28 LAB — HEMOGLOBIN AND HEMATOCRIT, BLOOD
HCT: 28.3 % — ABNORMAL LOW (ref 36.0–46.0)
Hemoglobin: 8.7 g/dL — ABNORMAL LOW (ref 12.0–15.0)

## 2021-03-28 LAB — COMPREHENSIVE METABOLIC PANEL
ALT: 13 U/L (ref 0–44)
AST: 87 U/L — ABNORMAL HIGH (ref 15–41)
Albumin: 2.8 g/dL — ABNORMAL LOW (ref 3.5–5.0)
Alkaline Phosphatase: 83 U/L (ref 38–126)
Anion gap: 7 (ref 5–15)
BUN: <5 mg/dL — ABNORMAL LOW (ref 6–20)
CO2: 24 mmol/L (ref 22–32)
Calcium: 7.7 mg/dL — ABNORMAL LOW (ref 8.9–10.3)
Chloride: 102 mmol/L (ref 98–111)
Creatinine, Ser: 0.6 mg/dL (ref 0.44–1.00)
GFR, Estimated: >60 mL/min (ref >60)
Glucose, Bld: 125 mg/dL — ABNORMAL HIGH (ref 70–99)
Potassium: 4.2 mmol/L (ref 3.5–5.1)
Sodium: 133 mmol/L — ABNORMAL LOW (ref 135–145)
Total Bilirubin: 4.7 mg/dL — ABNORMAL HIGH (ref 0.3–1.2)
Total Protein: 5.4 g/dL — ABNORMAL LOW (ref 6.5–8.1)

## 2021-03-28 LAB — PREPARE RBC (CROSSMATCH)

## 2021-03-28 LAB — AFP TUMOR MARKER: AFP, Serum, Tumor Marker: 3.1 ng/mL (ref 0.0–6.4)

## 2021-03-28 LAB — MAGNESIUM: Magnesium: 1.6 mg/dL — ABNORMAL LOW (ref 1.7–2.4)

## 2021-03-28 LAB — PROTIME-INR
INR: 1.9 — ABNORMAL HIGH (ref 0.8–1.2)
Prothrombin Time: 22.1 seconds — ABNORMAL HIGH (ref 11.4–15.2)

## 2021-03-28 SURGERY — ESOPHAGOGASTRODUODENOSCOPY (EGD) WITH PROPOFOL
Anesthesia: Monitor Anesthesia Care

## 2021-03-28 MED ORDER — MAGNESIUM SULFATE 2 GM/50ML IV SOLN
2.0000 g | Freq: Once | INTRAVENOUS | Status: AC
  Administered 2021-03-28: 2 g via INTRAVENOUS
  Filled 2021-03-28: qty 50

## 2021-03-28 MED ORDER — SODIUM CHLORIDE 0.9% IV SOLUTION: Freq: Once | INTRAVENOUS | Status: DC

## 2021-03-28 NOTE — Progress Notes (Signed)
Subjective: States she is having some abdominal discomfort from the fluid/swelling to her abdomen, feels that ascites may be about 50% improved today vs when she arrives to hospital, very mild edema in LEs. Denies nausea or vomiting. Soft, very small BM this morning. She states that she has hx of bad hemorrhoids that tend to bleed at times. Feels that the rectal exam yesterday irritated her hemorrhoids as she notes some rectal pressure and discomfort. She does note some BRB in the toilet when she strains to have a BM. No melena   Objective: Vital signs in last 24 hours: Temp:  [97.5 F (36.4 C)-98.9 F (37.2 C)] 98.9 F (37.2 C) (03/01 0544) Pulse Rate:  [84-97] 97 (03/01 0544) Resp:  [16-20] 19 (03/01 0544) BP: (105-138)/(61-73) 112/73 (03/01 0544) SpO2:  [92 %-100 %] 92 % (03/01 0544) Weight:  [68.9 kg] 68.9 kg (02/28 1200)   General:   Alert and oriented, pleasant Head:  Normocephalic and atraumatic. Eyes:  sclera mildly icteric Mouth:  Without lesions, mucosa pink and moist.  Heart:  S1, S2 present, no murmurs noted.  Lungs: Clear to auscultation bilaterally, without wheezing, rales, or rhonchi.  Abdomen:  Bowel sounds present but hypoactive, abdomen remains very full but soft, no tautness. Ventral hernia noted. No rebound or guarding. No masses appreciated  Msk:  Symmetrical without gross deformities. Normal posture. Pulses:  Normal pulses noted. Extremities:  Without clubbing, mild non pitting edema to bilateral LEs Neurologic:  Alert and  oriented x4;  grossly normal neurologically. No asterixis Skin:  Warm and dry, intact without significant lesions. jaundice Psych:  Alert and cooperative. Normal mood, tearful at times when discussing her condition.  Intake/Output from previous day: 02/28 0701 - 03/01 0700 In: 1327.1 [P.O.:240; I.V.:314.7; Blood:772.3] Out: -  Intake/Output this shift: No intake/output data recorded.  Lab Results: Recent Labs    03/27/21 0516  03/28/21 1010  WBC 5.4 4.4  HGB 6.5* 6.9*  HCT 21.3* 22.3*  PLT 221 165   BMET Recent Labs    03/27/21 0516 03/28/21 1010  NA 133* 133*  K 3.1* 4.2  CL 100 102  CO2 24 24  GLUCOSE 114* 125*  BUN 6 <5*  CREATININE 0.68 0.60  CALCIUM 8.0* 7.7*   LFT Recent Labs    03/27/21 0516 03/28/21 1010  PROT 6.2* 5.4*  ALBUMIN 2.2* 2.8*  AST 83* 87*  ALT 15 13  ALKPHOS 125 83  BILITOT 3.6* 4.7*   PT/INR Recent Labs    03/27/21 0516  LABPROT 20.1*  INR 1.7*    Studies/Results: US Abdomen Complete  Result Date: 03/27/2021 CLINICAL DATA:  Cirrhosis and ascites. EXAM: ABDOMEN ULTRASOUND COMPLETE COMPARISON:  CT 03/21/2021 FINDINGS: Gallbladder: Cholecystectomy. Common bile duct: Diameter: 3 mm Liver: Liver has a mildly nodular contour and suggestive for cirrhosis. Small amount of perihepatic ascites. Increased echogenicity in the liver. No discrete liver lesion identified. Portal vein is patent on color Doppler imaging with normal direction of blood flow towards the liver. IVC: No abnormality visualized. Pancreas: Limited evaluation Spleen: Splenomegaly. Spleen measures 19.0 x 16.4 x 12.9, calculated volume is 2114 mL. Right Kidney: Length: 11.7 cm. Echogenicity within normal limits. No mass or hydronephrosis visualized. Left Kidney: Length: 10.7 cm. Echogenicity within normal limits. No mass or hydronephrosis visualized. Abdominal aorta: No aneurysm visualized. Other findings: Perihepatic ascites. IMPRESSION: 1. Cirrhosis. Liver has a nodular contour with increased echogenicity. No discrete liver lesions but this could be better characterized with MRI. 2. Ascites and splenomegaly. Findings  are compatible with portal hypertension. Electronically Signed   By: Richarda Overlie M.D.   On: 03/27/2021 13:46   US Paracentesis  Result Date: 03/27/2021 Ulyses Southward, MD     03/27/2021  1:25 PM PreOperative Dx: ascites Postoperative Dx: ascites Procedure:   US guided paracentesis Radiologist:  Tyron Russell  Anesthesia:  10 ml of1% lidocaine Specimen:  3.5 L of yellow ascitic fluid EBL:   < 1 ml Complications: none    US LIVER DOPPLER  Result Date: 03/27/2021 CLINICAL DATA:  Cirrhosis. EXAM: DUPLEX ULTRASOUND OF LIVER TECHNIQUE: Color and duplex Doppler ultrasound was performed to evaluate the hepatic in-flow and out-flow vessels. COMPARISON:  Abdominal ultrasound 03/27/2021 and CT abdomen 03/21/2021 FINDINGS: Liver: Nodular contour with increased echogenicity. Perihepatic ascites. No discrete liver lesion. Main Portal Vein size: 1.8 cm Portal Vein Velocities Main Prox:  38 cm/sec Main Mid: 48 cm/sec Main Dist:  49 cm/sec Right: 53 cm/sec Left: 27 cm/sec Hepatic Vein Velocities Right:  74 cm/sec Middle:  71 cm/sec Left:  82 cm/sec IVC: Present and patent with normal respiratory phasicity. Hepatic Artery Velocity:  252 cm/sec Splenic Vein Velocity:  23 cm/sec Spleen: 19 cm x 16.4 cm x 12.4 cm with a total volume of 2114 cm^3 (411 cm^3 is upper limit normal) Portal Vein Occlusion/Thrombus: No Splenic Vein Occlusion/Thrombus: No Ascites: Present Varices: None Normal hepatopetal flow in the portal veins. Normal hepatofugal flow in the hepatic veins. IMPRESSION: 1. Cirrhosis with evidence of portal hypertension demonstrated by ascites and splenomegaly. 2. Portal venous system is patent with normal direction of flow. Electronically Signed   By: Richarda Overlie M.D.   On: 03/27/2021 13:53    Assessment: Erica Banks is a 41 year old female with history of alcohol abuse, chronic anemia receiving iron infusions per hematology, anxiety, who presented with 3-week history of increasing abdominal distention, abdominal pain, and also reported melanotic stools and some hematochezia.  Recent CT on 03/21/2021 with findings highly suggestive of cirrhosis with moderate ascites and splenomegaly, new enlargement of lateral segment left lobe liver.  Also with new, nonspecific hypervascular hypoechoic lesions within right lobe liver.   In the ED, she was found to have a hemoglobin of 6.5, elevated AST, bilirubin, INR, admitted with acute on chronic anemia and decompensated cirrhosis, GI consulted for further evaluation and management.  Cirrhosis:  Previous heavy ETOH, wine atleast 5/7 days each week, maybe 2 regular sized bottles per day. Rare intake of liquor. Reports frequent ETOH over the past 10 years with last drink about 2.5 months ago. Denies IVDU. States that she began having swelling and discomfort in her abdomen about 3 weeks ago. She tells me that she did go through 2 entire bottles of tylenol over the summer due to pain in her abdomen after cholecystectomy. Unaware of presence of cirrhosis and reports being told she had fatty liver. 3.5 L of fluid removed during paracentesis yesterday with cell count 42, gram stain positive coccci, body fluid culture without growth on preliminary result. Acute Hep and AIH Serologies pending Recent CT 2/22 with nonspecific hypervascular hypoechoic lesions within right lobe of uncertain etiology Will need MRI liver protocol outpatient if AFP normal. MELD 20 yesterday, 21 today, though no updated INR, will order repeat.   Acute on chronic anemia: baseline hgb 8-10 range, receiving IV iron with hematology. Hgb 9.5 in January 2023, 6.5 on admission 2 units PRBCs yesterday. Hgb remains low today at 6.9. received 1 unit PRBC, 3 total this admission. endorses hx of hemorrhoids with some  BRB noted in the toilet upon straining. Also endorses pain and discomfort of hemorrhoids after rectal exam yesterday. Patient previously reported ibuprofen use for the past few weeks for abdominal pain. Last colonoscopy 2015 with external hemorrhoids, no previous EGD. EGD postponed today due to ongoing anemia, will plan to proceed tomorrow if hgb improves as she needs evaluation for esophageal varices, gastric varices, portal gastropathy, PUD, gastritis, duodenitis. Continued on octreotide infusion curently and PPI IV BID  as well as rocephin for SBP prophylaxis.   Abdominal pain: generalized, in presence of ascites. Could be influenced by possible gastritits, PUD, in setting of dark stools and previously reported Ibuprofen use.   Plan: EGD possible tomorrow if hgb improves Consider colonoscopy if EGD unremarkable Continue IV PPI BID Octreotide drip Rocephin 1 g daily for SBP prophylaxis MRI w liver protocol, likely outpatient Monitor H&H, transfuse <7 2g sodium diet today, NPO at midnight for EGD tmrw  Trend LFTs and INR daily Monitor for signs of HE   LOS: 1 day    03/28/2021, 11:40 AM   Erica Gaal L. Jeanmarie Hubert, MSN, APRN, AGNP-C Adult-Gerontology Nurse Practitioner [facility]

## 2021-03-28 NOTE — TOC Progression Note (Signed)
Transition of Care (TOC) - Progression Note  ? ? ?Patient Details  ?Name: Erica Banks ?MRN: [id number] ?Date of Birth: 01-24-1981 ? ?Transition of Care (TOC) CM/SW Contact  ?Salome Arnt, LCSW ?Phone Number: ?03/28/2021, 10:46 AM ? ?Clinical Narrative:   ?Transition of Care (TOC) Screening Note ? ? ?Patient Details  ?Name: Erica Banks ?Date of Birth: 1980/03/18 ? ? ?Transition of Care (TOC) CM/SW Contact:    ?Salome Arnt, LCSW ?Phone Number: ?03/28/2021, 10:46 AM ? ? ? ?Transition of Care Department [facility]) has reviewed patient and no TOC needs have been identified at this time. We will continue to monitor patient advancement through interdisciplinary progression rounds. If new patient transition needs arise, please place a TOC consult. ?  ? ? ? ?  ?Barriers to Discharge: Continued Medical Work up ? ?Expected Discharge Plan and Services ?  ?  ?  ?  ?  ?                ?  ?  ?  ?  ?  ?  ?  ?  ?  ?  ? ? ?Social Determinants of Health (SDOH) Interventions ?  ? ?Readmission Risk Interventions ?No flowsheet data found. ? ?

## 2021-03-28 NOTE — Plan of Care (Signed)

## 2021-03-28 NOTE — Progress Notes (Signed)
?Progress Note ? ? ?PatientSTACI Banks Banks DOB: 1980/11/19 DOA: 03/27/2021     1 ?DOS: the patient was seen and examined on 03/28/2021 ?  ?Brief hospital course: ?41 year old female with a history of alcohol abuse, iron deficiency anemia, and anxiety presenting with 3-week history of increasing abdominal distention and pain.  The patient states that she has a history of alcohol usage, but quit alcohol about 6 months prior to this admission.  She used to drink at least 2 bottles of wine on a daily basis for the 10 years.  She denies any illicit drugs.  Because of the abdominal pain and swelling, the patient states that she has been taking 8 ibuprofen on a daily basis.  She has had some subjective fevers and chills.  She states that she has had some melanotic stools as well as some hematochezia.  She feels the hematochezia may be related to her hemorrhoids.  She states that she receives iron infusions every 3 months.  Her last iron infusion was in November 2022.  She has intolerance to oral iron.  She has had some mild dyspnea on exertion but denies any chest pain, hematemesis, hemoptysis.  She denies any headache, neck pain, dysuria, hematuria. ?In the ED, patient was afebrile hemodynamically stable with oxygen saturation 98% on room air.  WBC 5.4, hemoglobin 6.5, platelets 221,000.  Sodium 133, potassium 3.1, bicarbonate 24, serum creatinine 0.68.  AST 83, ALT 15, alk phosphatase 125, total bilirubin 3.6, INR 1.7.  CT of the abdomen and pelvis on 03/21/2021 showed new enlargement of the left lobe of the liver.  There is moderate ascites and splenomegaly.  There is a nonspecific hypervascular hypoechoic lesion in the right lobe of the liver.  The patient was admitted for symptomatic anemia/acute blood loss anemia and decompensated liver cirrhosis. ? ?Assessment and Plan: ?* Acute blood loss anemia- (present on admission) ?Baseline hemoglobin ~8 ?Presented with hemoglobin 6.5 ?Transfused 2 units  PRBC ?Concerned about possible UGI bleed in addition to her hemorrhoidal bleed ?GI consult ?PPI twice daily ?Posttransfusion CBC remains low at 6.9. ?Transfuse 1/3 unit of PRBC on 3/1 ?Plans are for EGD on 3/2 ? ?Ascites ?Status post paracentesis on 2/28 with removal of 3.5 L of fluid ? ?Folate deficiency ?Start folic acid ? ?Iron deficiency anemia ?Patient has intolerance to oral iron ?Iron saturation 16% ?Ferritin 8 ?Nulecit x 1 ? ?Hyponatremia ?Secondary to liver cirrhosis ?Monitor with serial BMPs ? ?Alcoholic cirrhosis of liver with ascites ([place])- (present on admission) ?Patient has decompensated liver cirrhosis ?Request paracentesis ?Sent for cell count culture ?Culture has not shown any specific growth, although Gram stain does comment on gram-positive cocci ?She has been started empirically on ceftriaxone for SBP coverage, pending final culture results ?Plan to start furosemide and spironolactone once stabilized ? ?Hypokalemia- (present on admission) ?Replete ?Magnesium low, will be repleted ? ? ? ? ? ? ? ?Subjective: Continues to have some abdominal discomfort, but feels as though the swelling is better than it was on admission ? ?Physical Exam: ?Vitals:  ? 03/28/21 1245 03/28/21 1439 03/28/21 1454 03/28/21 2054  ?BP: 132/76 104/69 107/72 111/77  ?Pulse: 87 87 87 86  ?Resp: 12 15 16 14   ?Temp: 97.8 ?F (36.6 ?C) 97.8 ?F (36.6 ?C) 98 ?F (36.7 ?C) 98.4 ?F (36.9 ?C)  ?TempSrc: Oral Oral Oral   ?SpO2: 98% 99% 97% 100%  ?Weight:      ?Height:      ? ?General exam: Alert, awake, oriented x 3 ?  Respiratory system: Clear to auscultation. Respiratory effort normal. ?Cardiovascular system:RRR. No murmurs, rubs, gallops. ?Gastrointestinal system: Abdomen is diffusely tender distended, soft and nontender. No organomegaly or masses felt. Normal bowel sounds heard. ?Central nervous system: Alert and oriented. No focal neurological deficits. ?Extremities: No C/C/E, +pedal pulses ?Skin: No rashes, lesions or  ulcers ?Psychiatry: Judgement and insight appear normal. Mood & affect appropriate.  ? ? ?Data Reviewed: ? ?Reviewed CBC, chemistry and electrolytes ? ?Family Communication: Discussed with patient ? ?Disposition: ?Status is: Inpatient ?Remains inpatient appropriate because: Patient needs EGD for concern for upper GI bleeding, continue to monitor hemoglobin, continue antibiotics for possible SBP ? ? ? ? ? ? ? ? ? Planned Discharge Destination: Home ? ? ? ? ?Time spent: 35 minutes ? ?Author: ?Kathie Dike, MD ?03/28/2021 9:24 PM ? ?For on call review [email or web address].  ? ?

## 2021-03-28 NOTE — Assessment & Plan Note (Signed)
Status post paracentesis on 2/28 with removal of 3.5 L of fluid ?

## 2021-03-29 ENCOUNTER — Inpatient Hospital Stay (HOSPITAL_COMMUNITY): Payer: Medicaid Other | Admitting: Anesthesiology

## 2021-03-29 ENCOUNTER — Encounter (HOSPITAL_COMMUNITY)
Admission: EM | Disposition: A | Discharge status: Home / Self Care | Payer: Self-pay | Source: Home / Self Care | Attending: Internal Medicine

## 2021-03-29 ENCOUNTER — Telehealth: Payer: Self-pay | Admitting: Gastroenterology

## 2021-03-29 DIAGNOSIS — K3189 Other diseases of stomach and duodenum: Secondary | ICD-10-CM

## 2021-03-29 DIAGNOSIS — I8511 Secondary esophageal varices with bleeding: Secondary | ICD-10-CM

## 2021-03-29 DIAGNOSIS — K766 Portal hypertension: Secondary | ICD-10-CM

## 2021-03-29 DIAGNOSIS — I8510 Secondary esophageal varices without bleeding: Secondary | ICD-10-CM

## 2021-03-29 DIAGNOSIS — K921 Melena: Secondary | ICD-10-CM

## 2021-03-29 DIAGNOSIS — K7460 Unspecified cirrhosis of liver: Secondary | ICD-10-CM

## 2021-03-29 DIAGNOSIS — D62 Acute posthemorrhagic anemia: Secondary | ICD-10-CM

## 2021-03-29 HISTORY — PX: ESOPHAGOGASTRODUODENOSCOPY (EGD) WITH PROPOFOL: SHX5813

## 2021-03-29 LAB — HEPATITIS PANEL, ACUTE

## 2021-03-29 LAB — CBC
HCT: 25.9 % — ABNORMAL LOW (ref 36.0–46.0)
Hemoglobin: 7.7 g/dL — ABNORMAL LOW (ref 12.0–15.0)
MCH: 28.3 pg (ref 26.0–34.0)
MCHC: 29.7 g/dL — ABNORMAL LOW (ref 30.0–36.0)
MCV: 95.2 fL (ref 80.0–100.0)
Platelets: 165 10*3/uL (ref 150–400)
RBC: 2.72 MIL/uL — ABNORMAL LOW (ref 3.87–5.11)
RDW: 17 % — ABNORMAL HIGH (ref 11.5–15.5)
WBC: 4.8 10*3/uL (ref 4.0–10.5)
nRBC: 0 % (ref 0.0–0.2)

## 2021-03-29 LAB — BPAM RBC
Blood Product Expiration Date: 202304052359
Blood Product Expiration Date: 202304052359
Blood Product Expiration Date: 202304052359
ISSUE DATE / TIME: 202302280743
ISSUE DATE / TIME: 202302281327
ISSUE DATE / TIME: 202303011151
Unit Type and Rh: 5100
Unit Type and Rh: 5100
Unit Type and Rh: 5100

## 2021-03-29 LAB — IGG, IGA, IGM
IgA: 429 mg/dL — ABNORMAL HIGH (ref 87–352)
IgG (Immunoglobin G), Serum: 1175 mg/dL (ref 586–1602)
IgM (Immunoglobulin M), Srm: 85 mg/dL (ref 26–217)

## 2021-03-29 LAB — TYPE AND SCREEN
ABO/RH(D): O POS
Antibody Screen: NEGATIVE
Unit division: 0
Unit division: 0
Unit division: 0

## 2021-03-29 LAB — PROTIME-INR
INR: 1.8 — ABNORMAL HIGH (ref 0.8–1.2)
Prothrombin Time: 21 seconds — ABNORMAL HIGH (ref 11.4–15.2)

## 2021-03-29 LAB — MAGNESIUM: Magnesium: 1.8 mg/dL (ref 1.7–2.4)

## 2021-03-29 LAB — HEPATIC FUNCTION PANEL
ALT: 15 U/L (ref 0–44)
AST: 84 U/L — ABNORMAL HIGH (ref 15–41)
Albumin: 2.6 g/dL — ABNORMAL LOW (ref 3.5–5.0)
Alkaline Phosphatase: 90 U/L (ref 38–126)
Bilirubin, Direct: 1.7 mg/dL — ABNORMAL HIGH (ref 0.0–0.2)
Indirect Bilirubin: 2.2 mg/dL — ABNORMAL HIGH (ref 0.3–0.9)
Total Bilirubin: 3.9 mg/dL — ABNORMAL HIGH (ref 0.3–1.2)
Total Protein: 5.4 g/dL — ABNORMAL LOW (ref 6.5–8.1)

## 2021-03-29 LAB — BASIC METABOLIC PANEL
Anion gap: 7 (ref 5–15)
BUN: <5 mg/dL — ABNORMAL LOW (ref 6–20)
CO2: 24 mmol/L (ref 22–32)
Calcium: 7.7 mg/dL — ABNORMAL LOW (ref 8.9–10.3)
Chloride: 104 mmol/L (ref 98–111)
Creatinine, Ser: 0.57 mg/dL (ref 0.44–1.00)
GFR, Estimated: >60 mL/min (ref >60)
Glucose, Bld: 120 mg/dL — ABNORMAL HIGH (ref 70–99)
Potassium: 3.7 mmol/L (ref 3.5–5.1)
Sodium: 135 mmol/L (ref 135–145)

## 2021-03-29 LAB — CYTOLOGY - NON PAP

## 2021-03-29 LAB — HCV INTERPRETATION

## 2021-03-29 LAB — CERULOPLASMIN: Ceruloplasmin: 11.5 mg/dL — ABNORMAL LOW (ref 19.0–39.0)

## 2021-03-29 LAB — ANTI-SMOOTH MUSCLE ANTIBODY, IGG: F-Actin IgG: 9 Units (ref 0–19)

## 2021-03-29 LAB — MITOCHONDRIAL ANTIBODIES: Mitochondrial M2 Ab, IgG: <20 Units (ref 0.0–20.0)

## 2021-03-29 LAB — ANA W/REFLEX IF POSITIVE: Anti Nuclear Antibody (ANA): NEGATIVE

## 2021-03-29 SURGERY — ESOPHAGOGASTRODUODENOSCOPY (EGD) WITH PROPOFOL
Anesthesia: General

## 2021-03-29 MED ORDER — FUROSEMIDE 20 MG PO TABS
20.0000 mg | ORAL_TABLET | Freq: Every day | ORAL | Status: AC
  Administered 2021-03-29 – 2021-03-30 (×2): 20 mg via ORAL
  Filled 2021-03-29 (×2): qty 1

## 2021-03-29 MED ORDER — LIDOCAINE HCL (CARDIAC) PF 100 MG/5ML IV SOSY
PREFILLED_SYRINGE | INTRAVENOUS | Status: DC | PRN
  Administered 2021-03-29: 50 mg via INTRAVENOUS

## 2021-03-29 MED ORDER — MIDAZOLAM HCL 2 MG/2ML IJ SOLN
INTRAMUSCULAR | Status: AC
  Filled 2021-03-29: qty 2

## 2021-03-29 MED ORDER — POTASSIUM CHLORIDE CRYS ER 20 MEQ PO TBCR
40.0000 meq | EXTENDED_RELEASE_TABLET | Freq: Once | ORAL | Status: AC
  Administered 2021-03-29: 40 meq via ORAL
  Filled 2021-03-29: qty 2

## 2021-03-29 MED ORDER — SODIUM CHLORIDE 0.9 % IV SOLN
INTRAVENOUS | Status: DC
Start: 2021-03-29 — End: 2021-03-29

## 2021-03-29 MED ORDER — PHENYLEPHRINE 40 MCG/ML (10ML) SYRINGE FOR IV PUSH (FOR BLOOD PRESSURE SUPPORT)
PREFILLED_SYRINGE | INTRAVENOUS | Status: DC | PRN
  Administered 2021-03-29: 80 ug via INTRAVENOUS

## 2021-03-29 MED ORDER — LACTATED RINGERS IV SOLN: INTRAVENOUS | Status: DC

## 2021-03-29 MED ORDER — OXYCODONE HCL 5 MG PO TABS
5.0000 mg | ORAL_TABLET | Freq: Once | ORAL | Status: AC
  Administered 2021-03-29: 5 mg via ORAL
  Filled 2021-03-29: qty 1

## 2021-03-29 MED ORDER — SPIRONOLACTONE 25 MG PO TABS
50.0000 mg | ORAL_TABLET | Freq: Every day | ORAL | Status: AC
  Administered 2021-03-29 – 2021-03-30 (×2): 50 mg via ORAL
  Filled 2021-03-29 (×2): qty 2

## 2021-03-29 MED ORDER — PROPOFOL 10 MG/ML IV BOLUS
INTRAVENOUS | Status: DC | PRN
Start: 2021-03-29 — End: 2021-03-29
  Administered 2021-03-29: 100 mg via INTRAVENOUS

## 2021-03-29 MED ORDER — MIDAZOLAM HCL 2 MG/2ML IJ SOLN
INTRAMUSCULAR | Status: DC | PRN
  Administered 2021-03-29: 2 mg via INTRAVENOUS

## 2021-03-29 NOTE — Op Note (Signed)
[facility] ?Patient Name: Erica Banks ?Procedure Date: 03/29/2021 11:48 AM ?MRN: [id number] ?Date of Birth: [birth year] ?Attending MD: Elon Alas. Abbey Chatters , DO ?CSN: [id number] ?Age: 41 ?Admit Type: Inpatient ?Procedure:                Upper GI endoscopy ?Indications:              Acute post hemorrhagic anemia, Melena ?Providers:                Elon Alas. Abbey Chatters, DO, Hughie Closs RN, RN, Cathi Roan  ?                          Gloriann Loan, RN, Raphael Gibney, Technician ?Referring MD:              ?Medicines:                See the Anesthesia note for documentation of the  ?                          administered medications ?Complications:            No immediate complications. ?Estimated Blood Loss:     Estimated blood loss was minimal. ?Procedure:                Pre-Anesthesia Assessment: ?                          - The anesthesia plan was to use monitored  ?                          anesthesia care (MAC). ?                          After obtaining informed consent, the endoscope was  ?                          passed under direct vision. Throughout the  ?                          procedure, the patient's blood pressure, pulse, and  ?                          oxygen saturations were monitored continuously. The  ?                          GIF-H190 ([id number]) scope was introduced through the  ?                          mouth, and advanced to the second part of duodenum.  ?                          The upper GI endoscopy was accomplished without  ?                          difficulty. The patient tolerated the procedure  ?  well. ?Scope In: 12:03:31 PM ?Scope Out: 12:06:25 PM ?Total Procedure Duration: 0 hours 2 minutes 54 seconds  ?Findings: ?     Grade I, small (< 5 mm) varices were found in the lower third of the  ?     esophagus. ?     Portal hypertensive gastropathy was found in the entire examined  ?     stomach. Friable tissue with spontaneous ooze by passing scope. ?     The duodenal bulb,  first portion of the duodenum and second portion of  ?     the duodenum were normal. 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 ?                          - Portal hypertensive gastropathy. ?                          - Normal duodenal bulb, first portion of the  ?                          duodenum and second portion of the duodenum. ?                          - No specimens collected. ?Moderate Sedation: ?     Per Anesthesia Care ?Recommendation:           - Return patient to hospital ward for ongoing care. ?                          - Okay to DC cotreotide once bag empty. ?                          - Continue IV protonix 40 mg BID ?                          - Consider NSBB from primary ppx for varices. ?                          - GI bleed possibly due to friable PHG. Other  ?                          etiologies in small bowel and/or colon also  ?                          possible. Recommended colonoscopy. Patient would  ?                          like to hold off and do as outpatient if possible. ?                          - Okay to start on low sodium diet. ?Procedure Code(s):        --- Professional --- ?                          [phone], Esophagogastroduodenoscopy,  flexible,  ?                          transoral; diagnostic, including collection of  ?                          specimen(s) by brushing or washing, when performed  ?                          (separate procedure) ?Diagnosis Code(s):        --- Professional --- ?                          I85.00, Esophageal varices without bleeding ?                          K76.6, Portal hypertension ?                          K31.89, Other diseases of stomach and duodenum ?                          D62, Acute posthemorrhagic anemia ?                          K92.1, Melena (includes Hematochezia) ?CPT copyright 2019 American Medical Association. All rights reserved. ?The codes documented in this report are preliminary and upon coder  review may  ?be revised to meet current compliance requirements. ?Elon Alas. Abbey Chatters, DO ?Elon Alas. McFarland, DO ?03/29/2021 1:33:10 PM ?This report has been signed electronically. ?Number of Addenda: 0 ?

## 2021-03-29 NOTE — Telephone Encounter (Addendum)
Patient will need hospital follow up in 2-3 weeks with Baxter Hire NP. To schedule colonoscopy for IDA, follow up liver lesion with MRI, f/u cirrhosis, low ceruloplasmin (consider 24 hour urinary copper). ?

## 2021-03-29 NOTE — Anesthesia Procedure Notes (Signed)
Date/Time: 03/29/2021 12:05 PM ?Performed by: Orlie Dakin, CRNA ?Pre-anesthesia Checklist: Patient identified, Emergency Drugs available, Suction available and Patient being monitored ?Patient Re-evaluated:Patient Re-evaluated prior to induction ?Oxygen Delivery Method: Nasal cannula ?Induction Type: IV induction ?Placement Confirmation: positive ETCO2 ? ? ? ? ?

## 2021-03-29 NOTE — Anesthesia Postprocedure Evaluation (Signed)
Anesthesia Post Note ? ?Patient: CEONA CONTORNO ? ?Procedure(s) Performed: ESOPHAGOGASTRODUODENOSCOPY (EGD) WITH PROPOFOL ? ?Patient location during evaluation: PACU ?Anesthesia Type: General ?Level of consciousness: awake and alert and oriented ?Pain management: pain level controlled ?Vital Signs Assessment: post-procedure vital signs reviewed and stable ?Respiratory status: spontaneous breathing, nonlabored ventilation and respiratory function stable ?Cardiovascular status: blood pressure returned to baseline and stable ?Postop Assessment: no apparent nausea or vomiting ?Anesthetic complications: no ? ? ?No notable events documented. ? ? ?Last Vitals:  ?Vitals:  ? 03/29/21 1249 03/29/21 1327  ?BP: 124/83 122/85  ?Pulse:  77  ?Resp: 18 20  ?Temp: (!) 36.4 ?C 36.7 ?C  ?SpO2: 99% 100%  ?  ?Last Pain:  ?Vitals:  ? 03/29/21 1249  ?TempSrc:   ?PainSc: 0-No pain  ? ? ?  ?  ?  ?  ?  ?  ? ?Tariya Morrissette C Jamielyn Petrucci ? ? ? ? ?

## 2021-03-29 NOTE — Progress Notes (Signed)
?Progress Note ? ? ?PatientYOLINDA Banks TRR:[id number] DOB: September 01, 1980 DOA: 03/27/2021     2 ?DOS: the patient was seen and examined on 03/29/2021 ?  ?Brief hospital course: ?41 year old female with a history of alcohol abuse, iron deficiency anemia, and anxiety presenting with 3-week history of increasing abdominal distention and pain.  The patient states that she has a history of alcohol usage, but quit alcohol about 6 months prior to this admission.  She used to drink at least 2 bottles of wine on a daily basis for the 10 years.  She denies any illicit drugs.  Because of the abdominal pain and swelling, the patient states that she has been taking 8 ibuprofen on a daily basis.  She has had some subjective fevers and chills.  She states that she has had some melanotic stools as well as some hematochezia.  She feels the hematochezia may be related to her hemorrhoids.  She states that she receives iron infusions every 3 months.  Her last iron infusion was in November 2022.  She has intolerance to oral iron.  She has had some mild dyspnea on exertion but denies any chest pain, hematemesis, hemoptysis.  She denies any headache, neck pain, dysuria, hematuria. ?In the ED, patient was afebrile hemodynamically stable with oxygen saturation 98% on room air.  WBC 5.4, hemoglobin 6.5, platelets 221,000.  Sodium 133, potassium 3.1, bicarbonate 24, serum creatinine 0.68.  AST 83, ALT 15, alk phosphatase 125, total bilirubin 3.6, INR 1.7.  CT of the abdomen and pelvis on 03/21/2021 showed new enlargement of the left lobe of the liver.  There is moderate ascites and splenomegaly.  There is a nonspecific hypervascular hypoechoic lesion in the right lobe of the liver.  The patient was admitted for symptomatic anemia/acute blood loss anemia and decompensated liver cirrhosis. ? ?Assessment and Plan: ?* Acute blood loss anemia ?Baseline hemoglobin ~8 ?Presented with hemoglobin 6.5 ?Transfused 3 units PRBC ?Concerned about possible  UGI bleed in addition to her hemorrhoidal bleed ?GI consult ?PPI twice daily ?Posttransfusion CBC 7.7, continue to follow ?Patient to have EGD today ?Continue on octreotide for now ? ?Ascites ?Status post paracentesis on 2/28 with removal of 3.5 L of fluid ? ?Folate deficiency ?Start folic acid ? ?Iron deficiency anemia ?Patient has intolerance to oral iron ?Iron saturation 16% ?Ferritin 8 ?Nulecit x 1 ? ?Hyponatremia ?Secondary to liver cirrhosis ?Monitor with serial BMPs ? ?Alcoholic cirrhosis of liver with ascites ([place]) ?Patient has decompensated liver cirrhosis ?She had paracentesis on 2/28 with removal of 3.5L fluid ?Culture has not shown any specific growth, although Gram stain does comment on gram-positive cocci ?She has been started empirically on ceftriaxone for SBP coverage, pending final culture results ?Now started on furosemide and aldactone ?Can consider starting NSBB ?Will consider having repeat paracentesis tomorrow if abd continues to have increased distention ? ?Hypokalemia ?Replete ?Magnesium low, will be repleted ? ? ? ? ? ? ? ?Subjective: continues to have abdominal discomfort. Feels that her abdomen may be getting more distended ? ?Physical Exam: ?Vitals:  ? 03/29/21 1211 03/29/21 1215 03/29/21 1249 03/29/21 1327  ?BP: (!) 103/54 (!) 100/49 124/83 122/85  ?Pulse: 78 78  77  ?Resp: 16 17 18 20   ?Temp: 98.4 ?F (36.9 ?C)  (!) 97.5 ?F (36.4 ?C) 98.1 ?F (36.7 ?C)  ?TempSrc:      ?SpO2: 100% 100% 99% 100%  ?Weight:      ?Height:      ? ?General exam: Alert, awake, oriented x  3 ?Respiratory system: Clear to auscultation. Respiratory effort normal. ?Cardiovascular system:RRR. No murmurs, rubs, gallops. ?Gastrointestinal system: Abdomen is diffusely tender distended, soft and nontender. No organomegaly or masses felt. Normal bowel sounds heard. ?Central nervous system: Alert and oriented. No focal neurological deficits. ?Extremities: Does have lower extremity ?Skin: No rashes, lesions or  ulcers ?Psychiatry: Judgement and insight appear normal. Mood & affect appropriate.  ? ? ?Data Reviewed: ? ?Reviewed CBC, chemistry and electrolytes ? ?Family Communication: Discussed with patient ? ?Disposition: ?Status is: Inpatient ?Remains inpatient appropriate because: Follow-up to ensure stability ? ? ? ? ? ? ? ? Planned Discharge Destination: Home ? ? ? ? ?Time spent: 35 minutes ? ?Author: ?Kathie Dike, MD ?03/29/2021 8:28 PM ? ?For on call review [email or web address].  ? ?

## 2021-03-29 NOTE — Anesthesia Preprocedure Evaluation (Signed)
Anesthesia Evaluation  ?Patient identified by MRN, date of birth, ID band ?Patient awake ? ? ? ?Reviewed: ?Allergy & Precautions, NPO status , Patient's Chart, lab work & pertinent test results ? ?Airway ?Mallampati: II ? ?TM Distance: >3 FB ?Neck ROM: Full ? ? ? Dental ? ?(+) Dental Advisory Given, Teeth Intact ?  ?Pulmonary ?Current Smoker and Patient abstained from smoking., PE ?  ?+ rhonchi ? ? ? ? ? ? ? Cardiovascular ?negative cardio ROS ?Normal cardiovascular exam ?Rhythm:Regular Rate:Normal ? ?24-Aug-2020 14:02:32 [facility]-ED ROUTINE RECORD ?1980-03-02 (39 yr) ?Female Caucasian ?Test QJJ:HERDEYC mental status ?Vent. rate 106 BPM ?PR interval 112 ms ?QRS duration 68 ms ?QT/QTcB 362/480 ms ?P-R-T axes 16 -6 38 ?Sinus tachycardia ?Minimal voltage criteria for LVH, may be normal variant ( R in aVL ) ?Anterolateral infarct , age undetermined ?Abnormal ECG ?No acute changes ?No significant change since last tracing ?Confirmed by Derwood Kaplan [phone]) on 08/25/2020 4:25:20 PM ?  ?Neuro/Psych ?PSYCHIATRIC DISORDERS Anxiety negative neurological ROS ?   ? GI/Hepatic ?GERD  Medicated,(+) Cirrhosis  ? ascites ? substance abuse ? alcohol use and marijuana use,   ?Endo/Other  ?negative endocrine ROS ? Renal/GU ?negative Renal ROS  ?negative genitourinary ?  ?Musculoskeletal ?negative musculoskeletal ROS ?(+)  ? Abdominal ?  ?Peds ?negative pediatric ROS ?(+)  Hematology ? ?(+) Blood dyscrasia, anemia ,   ?Anesthesia Other Findings ? ? Reproductive/Obstetrics ?negative OB ROS ? ?  ? ? ? ? ? ? ? ? ? ? ? ? ? ?  ?  ? ? ? ? ? ? ? ?Anesthesia Physical ?Anesthesia Plan ? ?ASA: 4 ? ?Anesthesia Plan: General  ? ?Post-op Pain Management: Minimal or no pain anticipated  ? ?Induction: Intravenous ? ?PONV Risk Score and Plan: TIVA ? ?Airway Management Planned: Nasal Cannula and Natural Airway ? ?Additional Equipment:  ? ?Intra-op Plan:  ? ?Post-operative Plan: Possible Post-op  intubation/ventilation ? ?Informed Consent: I have reviewed the patients History and Physical, chart, labs and discussed the procedure including the risks, benefits and alternatives for the proposed anesthesia with the patient or authorized representative who has indicated his/her understanding and acceptance.  ? ? ? ?Dental advisory given ? ?Plan Discussed with: CRNA and Surgeon ? ?Anesthesia Plan Comments:   ? ? ? ? ? ?Anesthesia Quick Evaluation ? ?

## 2021-03-29 NOTE — Transfer of Care (Signed)
Immediate Anesthesia Transfer of Care Note ? ?Patient: Erica Banks ? ?Procedure(s) Performed: ESOPHAGOGASTRODUODENOSCOPY (EGD) WITH PROPOFOL ? ?Patient Location: PACU ? ?Anesthesia Type:General ? ?Level of Consciousness: drowsy ? ?Airway & Oxygen Therapy: Patient Spontanous Breathing and Patient connected to nasal cannula oxygen ? ?Post-op Assessment: Report given to RN and Post -op Vital signs reviewed and stable ? ?Post vital signs: Reviewed and stable ? ?Last Vitals:  ?Vitals Value Taken Time  ?BP    ?Temp    ?Pulse 77 03/29/21 1211  ?Resp 16 03/29/21 1211  ?SpO2 100 % 03/29/21 1211  ?Vitals shown include unvalidated device data. ? ?Last Pain:  ?Vitals:  ? 03/29/21 1158  ?TempSrc:   ?PainSc: 6   ?   ? ?Patients Stated Pain Goal: 5 (03/29/21 1112) ? ?Complications: No notable events documented. ?

## 2021-03-29 NOTE — Interval H&P Note (Signed)
History and Physical Interval Note: ? ?03/29/2021 ?11:27 AM ? ?Erica Banks  has presented today for surgery, with the diagnosis of ida, melena, decompensated cirrhosis.  The various methods of treatment have been discussed with the patient and family. After consideration of risks, benefits and other options for treatment, the patient has consented to  Procedure(s): ?ESOPHAGOGASTRODUODENOSCOPY (EGD) WITH PROPOFOL (N/A) as a surgical intervention.  The patient's history has been reviewed, patient examined, no change in status, stable for surgery.  I have reviewed the patient's chart and labs.  Questions were answered to the patient's satisfaction.   ? ? ?Lanelle Bal ? ? ?

## 2021-03-30 ENCOUNTER — Encounter (HOSPITAL_COMMUNITY): Payer: Self-pay | Admitting: Internal Medicine

## 2021-03-30 ENCOUNTER — Inpatient Hospital Stay (HOSPITAL_COMMUNITY): Payer: Medicaid Other

## 2021-03-30 DIAGNOSIS — K7460 Unspecified cirrhosis of liver: Secondary | ICD-10-CM

## 2021-03-30 DIAGNOSIS — R188 Other ascites: Secondary | ICD-10-CM

## 2021-03-30 DIAGNOSIS — K769 Liver disease, unspecified: Secondary | ICD-10-CM

## 2021-03-30 DIAGNOSIS — K7011 Alcoholic hepatitis with ascites: Secondary | ICD-10-CM

## 2021-03-30 LAB — BODY FLUID CELL COUNT WITH DIFFERENTIAL
Eos, Fluid: 0 %
Lymphs, Fluid: 31 %
Monocyte-Macrophage-Serous Fluid: 62 % (ref 50–90)
Neutrophil Count, Fluid: 7 % (ref 0–25)
Total Nucleated Cell Count, Fluid: 151 cu mm (ref 0–1000)

## 2021-03-30 LAB — GRAM STAIN

## 2021-03-30 LAB — COMPREHENSIVE METABOLIC PANEL
ALT: 14 U/L (ref 0–44)
AST: 73 U/L — ABNORMAL HIGH (ref 15–41)
Albumin: 2.5 g/dL — ABNORMAL LOW (ref 3.5–5.0)
Alkaline Phosphatase: 90 U/L (ref 38–126)
Anion gap: 5 (ref 5–15)
BUN: <5 mg/dL — ABNORMAL LOW (ref 6–20)
CO2: 26 mmol/L (ref 22–32)
Calcium: 8 mg/dL — ABNORMAL LOW (ref 8.9–10.3)
Chloride: 104 mmol/L (ref 98–111)
Creatinine, Ser: 0.61 mg/dL (ref 0.44–1.00)
GFR, Estimated: >60 mL/min (ref >60)
Glucose, Bld: 108 mg/dL — ABNORMAL HIGH (ref 70–99)
Potassium: 4.3 mmol/L (ref 3.5–5.1)
Sodium: 135 mmol/L (ref 135–145)
Total Bilirubin: 2.4 mg/dL — ABNORMAL HIGH (ref 0.3–1.2)
Total Protein: 5.4 g/dL — ABNORMAL LOW (ref 6.5–8.1)

## 2021-03-30 LAB — PROTIME-INR
INR: 1.6 — ABNORMAL HIGH (ref 0.8–1.2)
Prothrombin Time: 19.2 seconds — ABNORMAL HIGH (ref 11.4–15.2)

## 2021-03-30 LAB — CBC
HCT: 27.3 % — ABNORMAL LOW (ref 36.0–46.0)
Hemoglobin: 8.2 g/dL — ABNORMAL LOW (ref 12.0–15.0)
MCH: 29 pg (ref 26.0–34.0)
MCHC: 30 g/dL (ref 30.0–36.0)
MCV: 96.5 fL (ref 80.0–100.0)
Platelets: 158 10*3/uL (ref 150–400)
RBC: 2.83 MIL/uL — ABNORMAL LOW (ref 3.87–5.11)
RDW: 17 % — ABNORMAL HIGH (ref 11.5–15.5)
WBC: 5.5 10*3/uL (ref 4.0–10.5)
nRBC: 0 % (ref 0.0–0.2)

## 2021-03-30 IMAGING — US US PARACENTESIS
1 series · 2 of 2 positions shown · non-contrast
Comparison: none

INDICATION: Anemia, ascites,, cirrhosis

[Series 1: us paracentesis · 0.27mm/px · 2 of 2 slices shown]
[im 1/2]
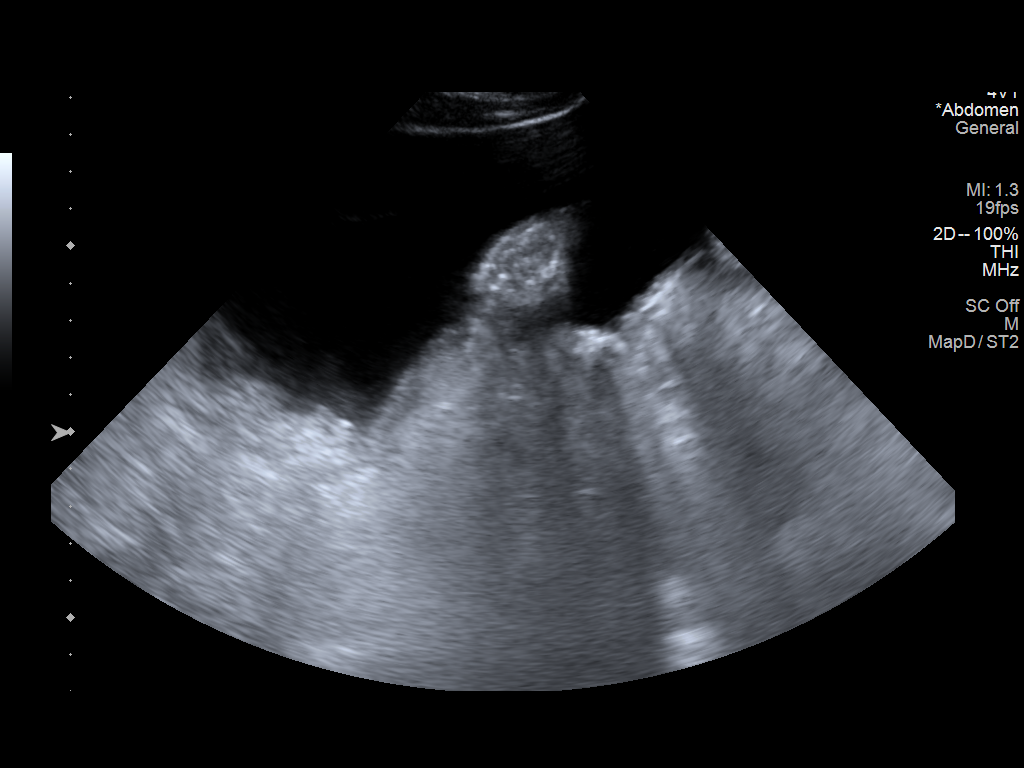
[im 2/2]
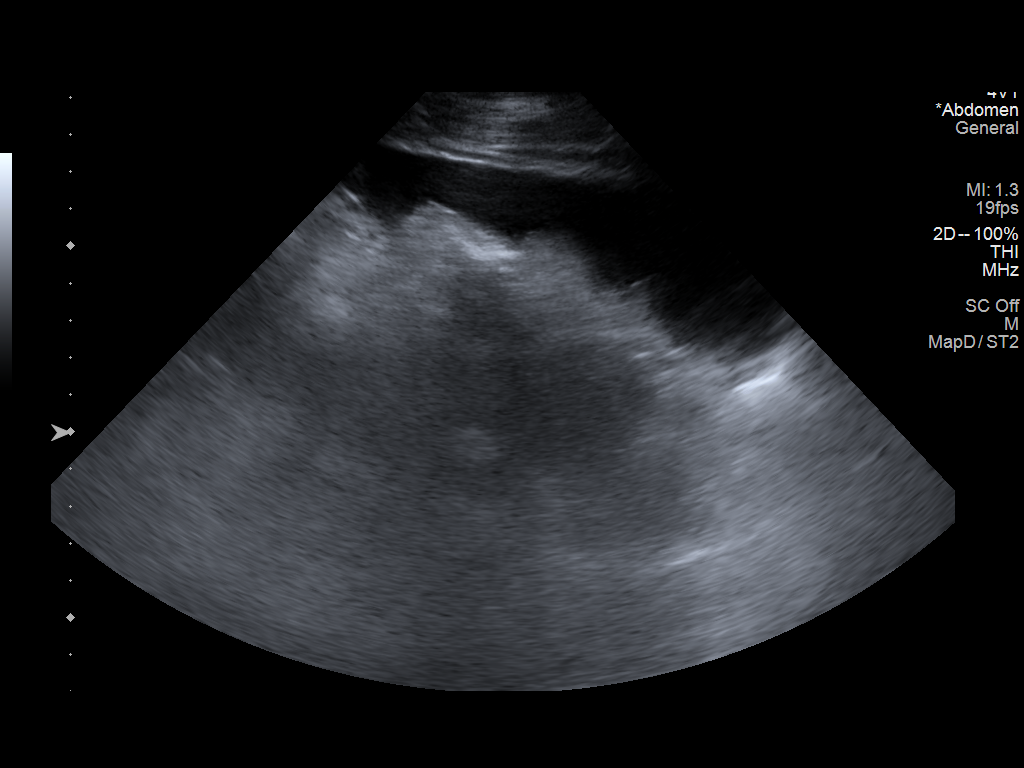

[2 of 2 positions shown; findings below may reference images not displayed]

EXAM:
ULTRASOUND GUIDED DIAGNOSTIC AND THERAPEUTIC PARACENTESIS

MEDICATIONS:
None.

COMPLICATIONS:
None immediate.

PROCEDURE:
Informed written consent was obtained from the patient after a
discussion of the risks, benefits and alternatives to treatment. A
timeout was performed prior to the initiation of the procedure.

Initial ultrasound scanning demonstrates a large amount of ascites
within the right lower abdominal quadrant. The right lower abdomen
was prepped and draped in the usual sterile fashion. 1% lidocaine
was used for local anesthesia.

Following this, a 5 French Yueh catheter was introduced. An
ultrasound image was saved for documentation purposes. The
paracentesis was performed. The catheter was removed and a dressing
was applied. The patient tolerated the procedure well without
immediate post procedural complication.
FINDINGS: A total of approximately 3.2 L of yellow fluid was removed. Samples
were sent to the laboratory as requested by the clinical team.
IMPRESSION: Successful ultrasound-guided paracentesis yielding 3.2 liters of
peritoneal fluid.

## 2021-03-30 MED ORDER — FUROSEMIDE 40 MG PO TABS: 40.0000 mg | ORAL_TABLET | Freq: Every day | ORAL | Status: DC

## 2021-03-30 MED ORDER — FUROSEMIDE 40 MG PO TABS: 40.0000 mg | ORAL_TABLET | Freq: Every day | ORAL | 1 refills | Status: DC

## 2021-03-30 MED ORDER — FUROSEMIDE 20 MG PO TABS
20.0000 mg | ORAL_TABLET | Freq: Once | ORAL | Status: AC
  Administered 2021-03-30: 20 mg via ORAL
  Filled 2021-03-30: qty 1

## 2021-03-30 MED ORDER — SPIRONOLACTONE 25 MG PO TABS: 100.0000 mg | ORAL_TABLET | Freq: Every day | ORAL | Status: DC

## 2021-03-30 MED ORDER — PANTOPRAZOLE SODIUM 40 MG PO TBEC: 40.0000 mg | DELAYED_RELEASE_TABLET | Freq: Two times a day (BID) | ORAL | 3 refills | Status: DC

## 2021-03-30 MED ORDER — TRAMADOL HCL 50 MG PO TABS: 50.0000 mg | ORAL_TABLET | Freq: Four times a day (QID) | ORAL | 0 refills | Status: DC | PRN

## 2021-03-30 MED ORDER — SPIRONOLACTONE 100 MG PO TABS: 100.0000 mg | ORAL_TABLET | Freq: Every day | ORAL | 1 refills | Status: DC

## 2021-03-30 MED ORDER — SPIRONOLACTONE 25 MG PO TABS
50.0000 mg | ORAL_TABLET | Freq: Once | ORAL | Status: AC
  Administered 2021-03-30: 50 mg via ORAL
  Filled 2021-03-30: qty 2

## 2021-03-30 NOTE — Discharge Summary (Signed)
Physician Discharge Summary  Erica Banks EAV:[id number] DOB: [birth year] DOA: 03/27/2021  PCP: Neale Burly, MD  Admit date: 03/27/2021 Discharge date: 03/30/2021  Admitted From: home Disposition:  home  Recommendations for Outpatient Follow-up:  Follow up GI as outpatient Will need outpatient 24hr urine for copper for further eval of Wilson disease Will need MRI of liver with and withour contrast to evaluate liver lesions  Home Health: Equipment/Devices:    Discharge Condition:stable CODE STATUS:full code Diet recommendation: low salt  Brief/Interim Summary: 41 year old female with a history of alcohol abuse, iron deficiency anemia, and anxiety presenting with 3-week history of increasing abdominal distention and pain.  The patient states that she has a history of alcohol usage, but quit alcohol about 6 months prior to this admission.  She used to drink at least 2 bottles of wine on a daily basis for the 10 years.  She denies any illicit drugs.  Because of the abdominal pain and swelling, the patient states that she has been taking 8 ibuprofen on a daily basis.  She has had some subjective fevers and chills.  She states that she has had some melanotic stools as well as some hematochezia.  She feels the hematochezia may be related to her hemorrhoids.  She states that she receives iron infusions every 3 months.  Her last iron infusion was in November 2022.  She has intolerance to oral iron.  She has had some mild dyspnea on exertion but denies any chest pain, hematemesis, hemoptysis.  She denies any headache, neck pain, dysuria, hematuria. In the ED, patient was afebrile hemodynamically stable with oxygen saturation 98% on room air.  WBC 5.4, hemoglobin 6.5, platelets 221,000.  Sodium 133, potassium 3.1, bicarbonate 24, serum creatinine 0.68.  AST 83, ALT 15, alk phosphatase 125, total bilirubin 3.6, INR 1.7.  CT of the abdomen and pelvis on 03/21/2021 showed new enlargement of the left lobe  of the liver.  There is moderate ascites and splenomegaly.  There is a nonspecific hypervascular hypoechoic lesion in the right lobe of the liver.  The patient was admitted for symptomatic anemia/acute blood loss anemia and decompensated liver cirrhosis.  Discharge Diagnoses:  Principal Problem:   Acute blood loss anemia Active Problems:   Hypokalemia   Alcoholic cirrhosis of liver with ascites (HCC)   Hyponatremia   Iron deficiency anemia   Folate deficiency   Ascites   Cirrhosis of liver with ascites (HCC)   Liver lesion  Acute blood loss anemia Baseline hemoglobin ~8 Presented with hemoglobin 6.5 Transfused 3 units PRBC Concerned about possible UGI bleed in addition to her hemorrhoidal bleed GI consulted PPI twice daily Posttransfusion hemoglobin stable at 8.2 3/3 EGD: - Grade I and small (< 5 mm) esophageal varices.                           - Portal hypertensive gastropathy.                           - Normal duodenal bulb, first portion of the                            duodenum and second portion of the duodenum.                           - No specimens collected.  Ascites Status post paracentesis on 2/28 with removal of 3.5 L of fluid Subsequent paracentesis on 3/3 with removal of 3.2 L   Folate deficiency Start folic acid   Iron deficiency anemia Patient has intolerance to oral iron Iron saturation 16% Ferritin 8 Nulecit x 1   Hyponatremia Secondary to liver cirrhosis Monitor with serial BMPs   Alcoholic cirrhosis of liver with ascites ([place]) Patient has decompensated liver cirrhosis She had paracentesis on 2/28 with removal of 3.5L fluid Culture has not shown any specific growth, although Gram stain does comment on gram-positive cocci (suspected contaminant) She had been started empirically on ceftriaxone for SBP coverage Paracentesis repeated on 3/3 and cell count did not support SBP (which was similar to first para) Now started on furosemide and  aldactone Can consider starting NSBB as outpatient   Hypokalemia Replete Magnesium low, will be repleted  Discharge Instructions  Discharge Instructions     Diet - low sodium heart healthy   Complete by: As directed    Increase activity slowly   Complete by: As directed       Allergies as of 03/30/2021       Reactions   Demerol Anaphylaxis   Per patient cardiac arrest        Medication List     TAKE these medications    furosemide 40 MG tablet Commonly known as: LASIX Take 1 tablet (40 mg total) by mouth daily. Start taking on: March 31, 2021   gabapentin 300 MG capsule Commonly known as: NEURONTIN Take 300 mg by mouth 3 (three) times daily.   pantoprazole 40 MG tablet Commonly known as: PROTONIX Take 1 tablet (40 mg total) by mouth 2 (two) times daily before a meal. What changed: when to take this   spironolactone 100 MG tablet Commonly known as: ALDACTONE Take 1 tablet (100 mg total) by mouth daily. Start taking on: March 31, 2021   traMADol 50 MG tablet Commonly known as: Ultram Take 1 tablet (50 mg total) by mouth every 6 (six) hours as needed.        Follow-up Information     Montez Morita, Quillian Quince, MD. Schedule an appointment as soon as possible for a visit in 2 week(s).   Specialty: Gastroenterology Contact information: [address] [place] [address] [phone]                Allergies  Allergen Reactions   Demerol Anaphylaxis    Per patient cardiac arrest    Consultations: GI   Procedures/Studies: US Abdomen Complete  Result Date: 03/27/2021 CLINICAL DATA:  Cirrhosis and ascites. EXAM: ABDOMEN ULTRASOUND COMPLETE COMPARISON:  CT 03/21/2021 FINDINGS: Gallbladder: Cholecystectomy. Common bile duct: Diameter: 3 mm Liver: Liver has a mildly nodular contour and suggestive for cirrhosis. Small amount of perihepatic ascites. Increased echogenicity in the liver. No discrete liver lesion identified. Portal  vein is patent on color Doppler imaging with normal direction of blood flow towards the liver. IVC: No abnormality visualized. Pancreas: Limited evaluation Spleen: Splenomegaly. Spleen measures 19.0 x 16.4 x 12.9, calculated volume is 2114 mL. Right Kidney: Length: 11.7 cm. Echogenicity within normal limits. No mass or hydronephrosis visualized. Left Kidney: Length: 10.7 cm. Echogenicity within normal limits. No mass or hydronephrosis visualized. Abdominal aorta: No aneurysm visualized. Other findings: Perihepatic ascites. IMPRESSION: 1. Cirrhosis. Liver has a nodular contour with increased echogenicity. No discrete liver lesions but this could be better characterized with MRI. 2. Ascites and splenomegaly. Findings are compatible with portal hypertension.  Electronically Signed   By: Markus Daft M.D.   On: 03/27/2021 13:46   CT Abdomen Pelvis W Contrast  Result Date: 03/21/2021 CLINICAL DATA:  Abdominal pain, epigastric pain, bloating, upper abdominal discomfort for 2 months, prior hysterectomy, cholecystectomy. Weight loss of 60 pounds in 5 weeks leading up to gallbladder surgery. History GERD, smoking EXAM: CT ABDOMEN AND PELVIS WITH CONTRAST TECHNIQUE: Multidetector CT imaging of the abdomen and pelvis was performed using the standard protocol following bolus administration of intravenous contrast. RADIATION DOSE REDUCTION: This exam was performed according to the departmental dose-optimization program which includes automated exposure control, adjustment of the mA and/or kV according to patient size and/or use of iterative reconstruction technique. CONTRAST:  13m OMNIPAQUE IOHEXOL 300 MG/ML SOLN IV. Dilute oral contrast. COMPARISON:  07/20/2019 FINDINGS: Lower chest: Minimal bibasilar atelectasis Hepatobiliary: Gallbladder surgically absent. Small hypervascular focus RIGHT lobe liver inferiorly 6 mm diameter image 48, not seen previously. Additionally, new foci of low attenuation are seen medially RIGHT lobe  2.2 x 1.5 cm image 42 and 13 x 8 mm image 29. Interval enlargement of lateral segment LEFT lobe liver. Pancreas: Normal appearance Spleen: Newly enlarged since previous study, measuring 17.5 x 6.9 x 19.8 cm (volume = 1300 cm^3). No focal abnormalities. Adrenals/Urinary Tract: Adrenal glands, kidneys, ureters, and bladder normal appearance Stomach/Bowel: Normal appendix. Large and small bowel loops unremarkable. Stomach normal appearance Vascular/Lymphatic: Vascular structures patent.  No adenopathy. Reproductive: Uterus surgically absent. Small cyst LEFT ovary 19 x 18 mm image 70. Small cyst RIGHT ovary 3.0 x 2.7 cm image 71. No follow-up imaging recommended. Note: This recommendation does not apply to premenarchal patients and to those with increased risk (genetic, family history, elevated tumor markers or other high-risk factors) of ovarian cancer. Reference: JACR 2020 Feb; 17(2):248-254 Other: Moderate ascites. No free air. Subcutaneous infiltration in anterior abdominal wall subcutaneous fat as well as within mesentery. Musculoskeletal: Unremarkable IMPRESSION: New enlargement of lateral segment LEFT lobe liver, moderate ascites, and splenomegaly, findings highly suggestive of cirrhosis. Nonspecific hypervascular and hypoechoic lesions within the RIGHT lobe of the liver, new versus previous study, uncertain etiology; follow-up non emergent characterization of these lesions by MR imaging with and without contrast recommended to exclude tumor. Electronically Signed   By: MLavonia [place]M.D.   On: 03/21/2021 18:24   UKoreaParacentesis  Result Date: 03/30/2021 BLavonia [place] MD     03/30/2021  2:47 PM PreOperative Dx: Cirrhosis, ascites Postoperative Dx: Cirrhosis, ascites Procedure:   UKoreaguided paracentesis Radiologist:  BThornton PapasAnesthesia:  10 ml of1% lidocaine Specimen:  3.2 L of yellow ascitic fluid EBL:   < 1 ml Complications: None    UKoreaParacentesis  Result Date: 03/27/2021 INDICATION: Ascites EXAM: ULTRASOUND  GUIDED DIAGNOSTIC AND THERAPEUTIC PARACENTESIS MEDICATIONS: None. COMPLICATIONS: None immediate. PROCEDURE: Informed written consent was obtained from the patient after a discussion of the risks, benefits and alternatives to treatment. A timeout was performed prior to the initiation of the procedure. Initial ultrasound scanning demonstrates a large amount of ascites within the right lower abdominal quadrant. The right lower abdomen was prepped and draped in the usual sterile fashion. 1% lidocaine was used for local anesthesia. Following this, a 5 French catheter was introduced. An ultrasound image was saved for documentation purposes. The paracentesis was performed. The catheter was removed and a dressing was applied. The patient tolerated the procedure well without immediate post procedural complication. FINDINGS: A total of approximately 3.5 L of yellow ascitic fluid was removed. Specimens were sent to  laboratory for requested analysis. IMPRESSION: Successful ultrasound-guided paracentesis yielding 3.5 liters of peritoneal fluid. Electronically Signed   By: Lavonia [place] M.D.   On: 03/27/2021 13:26   US LIVER DOPPLER  Result Date: 03/27/2021 CLINICAL DATA:  Cirrhosis. EXAM: DUPLEX ULTRASOUND OF LIVER TECHNIQUE: Color and duplex Doppler ultrasound was performed to evaluate the hepatic in-flow and out-flow vessels. COMPARISON:  Abdominal ultrasound 03/27/2021 and CT abdomen 03/21/2021 FINDINGS: Liver: Nodular contour with increased echogenicity. Perihepatic ascites. No discrete liver lesion. Main Portal Vein size: 1.8 cm Portal Vein Velocities Main Prox:  38 cm/sec Main Mid: 48 cm/sec Main Dist:  49 cm/sec Right: 53 cm/sec Left: 27 cm/sec Hepatic Vein Velocities Right:  74 cm/sec Middle:  71 cm/sec Left:  82 cm/sec IVC: Present and patent with normal respiratory phasicity. Hepatic Artery Velocity:  252 cm/sec Splenic Vein Velocity:  23 cm/sec Spleen: 19 cm x 16.4 cm x 12.4 cm with a total volume of 2114 cm^3 (411  cm^3 is upper limit normal) Portal Vein Occlusion/Thrombus: No Splenic Vein Occlusion/Thrombus: No Ascites: Present Varices: None Normal hepatopetal flow in the portal veins. Normal hepatofugal flow in the hepatic veins. IMPRESSION: 1. Cirrhosis with evidence of portal hypertension demonstrated by ascites and splenomegaly. 2. Portal venous system is patent with normal direction of flow. Electronically Signed   By: Markus Daft M.D.   On: 03/27/2021 13:53      Subjective: She is feeling better today. Eager to discharge home  Discharge Exam: Vitals:   03/30/21 1310 03/30/21 1323 03/30/21 1333 03/30/21 1353  BP: 133/83 125/71 128/75 129/82  Pulse: 83 79 86 84  Resp: 18 18 18 18   Temp: 98.4 F (36.9 C)     TempSrc: Oral     SpO2: 99% 96% 99% 100%  Weight:      Height:        General: Pt is alert, awake, not in acute distress Cardiovascular: RRR, S1/S2 +, no rubs, no gallops Respiratory: CTA bilaterally, no wheezing, no rhonchi Abdominal: Soft, NT, ND, bowel sounds + Extremities: no edema, no cyanosis    The results of significant diagnostics from this hospitalization (including imaging, microbiology, ancillary and laboratory) are listed below for reference.     Microbiology: Recent Results (from the past 240 hour(s))  Resp Panel by RT-PCR (Flu A&B, Covid) Nasopharyngeal Swab     Status: None   Collection Time: 03/27/21  8:42 AM   Specimen: Nasopharyngeal Swab; Nasopharyngeal(NP) swabs in vial transport medium  Result Value Ref Range Status   SARS Coronavirus 2 by RT PCR NEGATIVE NEGATIVE Final    Comment: (NOTE) SARS-CoV-2 target nucleic acids are NOT DETECTED.  The SARS-CoV-2 RNA is generally detectable in upper respiratory specimens during the acute phase of infection. The lowest concentration of SARS-CoV-2 viral copies this assay can detect is 138 copies/mL. A negative result does not preclude SARS-Cov-2 infection and should not be used as the sole basis for treatment  or other patient management decisions. A negative result may occur with  improper specimen collection/handling, submission of specimen other than nasopharyngeal swab, presence of viral mutation(s) within the areas targeted by this assay, and inadequate number of viral copies(<138 copies/mL). A negative result must be combined with clinical observations, patient history, and epidemiological information. The expected result is Negative.  Fact Sheet for Patients:  EntrepreneurPulse.com.au  Fact Sheet for Healthcare Providers:  IncredibleEmployment.be  This test is no t yet approved or cleared by the [place] FDA and  has been authorized for detection and/or  diagnosis of SARS-CoV-2 by FDA under an Emergency Use Authorization (EUA). This EUA will remain  in effect (meaning this test can be used) for the duration of the COVID-19 declaration under Section 564(b)(1) of the Act, 21 U.S.C.section 360bbb-3(b)(1), unless the authorization is terminated  or revoked sooner.       Influenza A by PCR NEGATIVE NEGATIVE Final   Influenza B by PCR NEGATIVE NEGATIVE Final    Comment: (NOTE) The Xpert Xpress SARS-CoV-2/FLU/RSV plus assay is intended as an aid in the diagnosis of influenza from Nasopharyngeal swab specimens and should not be used as a sole basis for treatment. Nasal washings and aspirates are unacceptable for Xpert Xpress SARS-CoV-2/FLU/RSV testing.  Fact Sheet for Patients: EntrepreneurPulse.com.au  Fact Sheet for Healthcare Providers: IncredibleEmployment.be  This test is not yet approved or cleared by the [place] FDA and has been authorized for detection and/or diagnosis of SARS-CoV-2 by FDA under an Emergency Use Authorization (EUA). This EUA will remain in effect (meaning this test can be used) for the duration of the COVID-19 declaration under Section 564(b)(1) of the Act, 21 U.S.C. section  360bbb-3(b)(1), unless the authorization is terminated or revoked.  Performed at [facility], [address] [place], [place] [address]   Gram stain     Status: None   Collection Time: 03/27/21 11:15 AM   Specimen: Peritoneal Washings  Result Value Ref Range Status   Specimen Description PERITONEAL  Final   Special Requests NONE  Final   Gram Stain   Final    GRAM POSITIVE COCCI WBC PRESENT, PREDOMINANTLY MONONUCLEAR CYTOSPIN SMEAR Gram Stain Report Called to,Read Back By and Verified With: A CANTER UR [id number] K FORSYTH Performed at [facility], [address], [place], [place] [address]    Report Status 03/27/2021 FINAL  Final  Culture, body fluid w Gram Stain-bottle     Status: None (Preliminary result)   Collection Time: 03/27/21 11:15 AM   Specimen: Peritoneal Washings  Result Value Ref Range Status   Specimen Description PERITONEAL  Final   Special Requests   Final    BOTTLES DRAWN AEROBIC AND ANAEROBIC Blood Culture adequate volume   Culture   Final    NO GROWTH 3 DAYS Performed at [facility], [address], [place], [place] [address]    Report Status PENDING  Incomplete  Gram stain     Status: None   Collection Time: 03/30/21  1:20 PM   Specimen: Peritoneal Washings  Result Value Ref Range Status   Specimen Description PERITONEAL  Final   Special Requests NONE  Final   Gram Stain   Final    CYTOSPIN SMEAR NO ORGANISMS SEEN WBC PRESENT, PREDOMINANTLY MONONUCLEAR Performed at [facility], [address] [place], [place] [address]    Report Status 03/30/2021 FINAL  Final     Labs: BNP (last 3 results) Recent Labs    08/24/20 1424  BNP 62.6   Basic Metabolic Panel: Recent Labs  Lab 03/27/21 0516 03/28/21 1010 03/29/21 0435 03/30/21 0519  NA 133* 133* 135 135  K 3.1* 4.2 3.7 4.3  CL 100 102 104 104  CO2 24 24 24 26   GLUCOSE 114* 125* 120* 108*  BUN 6 <5* <5* <5*  CREATININE 0.68 0.60 0.57 0.61  CALCIUM 8.0* 7.7* 7.7* 8.0*  MG  --  1.6* 1.8   --    Liver Function Tests: Recent Labs  Lab 03/27/21 0516 03/28/21 1010 03/29/21 0927 03/30/21 0519  AST 83* 87* 84* 73*  ALT 15 13 15 14   ALKPHOS 125 83 90 90  BILITOT 3.6* 4.7* 3.9* 2.4*  PROT 6.2* 5.4* 5.4* 5.4*  ALBUMIN 2.2* 2.8* 2.6* 2.5*   Recent Labs  Lab 03/27/21 0516  LIPASE 25   No results for input(s): AMMONIA in the last 168 hours. CBC: Recent Labs  Lab 03/27/21 0516 03/28/21 1010 03/28/21 1715 03/29/21 0435 03/30/21 0519  WBC 5.4 4.4  --  4.8 5.5  NEUTROABS 3.3  --   --   --   --   HGB 6.5* 6.9* 8.7* 7.7* 8.2*  HCT 21.3* 22.3* 28.3* 25.9* 27.3*  MCV 95.9 94.9  --  95.2 96.5  PLT 221 165  --  165 158   Cardiac Enzymes: No results for input(s): CKTOTAL, CKMB, CKMBINDEX, TROPONINI in the last 168 hours. BNP: Invalid input(s): POCBNP CBG: No results for input(s): GLUCAP in the last 168 hours. D-Dimer No results for input(s): DDIMER in the last 72 hours. Hgb A1c No results for input(s): HGBA1C in the last 72 hours. Lipid Profile No results for input(s): CHOL, HDL, LDLCALC, TRIG, CHOLHDL, LDLDIRECT in the last 72 hours. Thyroid function studies No results for input(s): TSH, T4TOTAL, T3FREE, THYROIDAB in the last 72 hours.  Invalid input(s): FREET3 Anemia work up No results for input(s): VITAMINB12, FOLATE, FERRITIN, TIBC, IRON, RETICCTPCT in the last 72 hours. Urinalysis    Component Value Date/Time   COLORURINE AMBER (A) 08/24/2020 1552   APPEARANCEUR HAZY (A) 08/24/2020 1552   LABSPEC 1.025 08/24/2020 1552   PHURINE 5.0 08/24/2020 1552   GLUCOSEU NEGATIVE 08/24/2020 1552   HGBUR NEGATIVE 08/24/2020 1552   BILIRUBINUR NEGATIVE 08/24/2020 1552   KETONESUR NEGATIVE 08/24/2020 1552   PROTEINUR 30 (A) 08/24/2020 1552   UROBILINOGEN 0.2 04/10/2013 1432   NITRITE POSITIVE (A) 08/24/2020 1552   LEUKOCYTESUR SMALL (A) 08/24/2020 1552   Sepsis Labs Invalid input(s): PROCALCITONIN,  WBC,  LACTICIDVEN Microbiology Recent Results (from the past  240 hour(s))  Resp Panel by RT-PCR (Flu A&B, Covid) Nasopharyngeal Swab     Status: None   Collection Time: 03/27/21  8:42 AM   Specimen: Nasopharyngeal Swab; Nasopharyngeal(NP) swabs in vial transport medium  Result Value Ref Range Status   SARS Coronavirus 2 by RT PCR NEGATIVE NEGATIVE Final    Comment: (NOTE) SARS-CoV-2 target nucleic acids are NOT DETECTED.  The SARS-CoV-2 RNA is generally detectable in upper respiratory specimens during the acute phase of infection. The lowest concentration of SARS-CoV-2 viral copies this assay can detect is 138 copies/mL. A negative result does not preclude SARS-Cov-2 infection and should not be used as the sole basis for treatment or other patient management decisions. A negative result may occur with  improper specimen collection/handling, submission of specimen other than nasopharyngeal swab, presence of viral mutation(s) within the areas targeted by this assay, and inadequate number of viral copies(<138 copies/mL). A negative result must be combined with clinical observations, patient history, and epidemiological information. The expected result is Negative.  Fact Sheet for Patients:  EntrepreneurPulse.com.au  Fact Sheet for Healthcare Providers:  IncredibleEmployment.be  This test is no t yet approved or cleared by the [place] FDA and  has been authorized for detection and/or diagnosis of SARS-CoV-2 by FDA under an Emergency Use Authorization (EUA). This EUA will remain  in effect (meaning this test can be used) for the duration of the COVID-19 declaration under Section 564(b)(1) of the Act, 21 U.S.C.section 360bbb-3(b)(1), unless the authorization is terminated  or revoked sooner.  Influenza A by PCR NEGATIVE NEGATIVE Final   Influenza B by PCR NEGATIVE NEGATIVE Final    Comment: (NOTE) The Xpert Xpress SARS-CoV-2/FLU/RSV plus assay is intended as an aid in the diagnosis of influenza  from Nasopharyngeal swab specimens and should not be used as a sole basis for treatment. Nasal washings and aspirates are unacceptable for Xpert Xpress SARS-CoV-2/FLU/RSV testing.  Fact Sheet for Patients: EntrepreneurPulse.com.au  Fact Sheet for Healthcare Providers: IncredibleEmployment.be  This test is not yet approved or cleared by the [place] FDA and has been authorized for detection and/or diagnosis of SARS-CoV-2 by FDA under an Emergency Use Authorization (EUA). This EUA will remain in effect (meaning this test can be used) for the duration of the COVID-19 declaration under Section 564(b)(1) of the Act, 21 U.S.C. section 360bbb-3(b)(1), unless the authorization is terminated or revoked.  Performed at [facility], [address] [place], [place] [address]   Gram stain     Status: None   Collection Time: 03/27/21 11:15 AM   Specimen: Peritoneal Washings  Result Value Ref Range Status   Specimen Description PERITONEAL  Final   Special Requests NONE  Final   Gram Stain   Final    GRAM POSITIVE COCCI WBC PRESENT, PREDOMINANTLY MONONUCLEAR CYTOSPIN SMEAR Gram Stain Report Called to,Read Back By and Verified With: A CANTER PV [id number] K FORSYTH Performed at [facility], [address], [place], [place] [address]    Report Status 03/27/2021 FINAL  Final  Culture, body fluid w Gram Stain-bottle     Status: None (Preliminary result)   Collection Time: 03/27/21 11:15 AM   Specimen: Peritoneal Washings  Result Value Ref Range Status   Specimen Description PERITONEAL  Final   Special Requests   Final    BOTTLES DRAWN AEROBIC AND ANAEROBIC Blood Culture adequate volume   Culture   Final    NO GROWTH 3 DAYS Performed at [facility], [address] [place], [place] [address]    Report Status PENDING  Incomplete  Gram stain     Status: None   Collection Time: 03/30/21  1:20 PM   Specimen: Peritoneal Washings  Result Value Ref Range  Status   Specimen Description PERITONEAL  Final   Special Requests NONE  Final   Gram Stain   Final    CYTOSPIN SMEAR NO ORGANISMS SEEN WBC PRESENT, PREDOMINANTLY MONONUCLEAR Performed at [facility], [address] [place], [place] [address]    Report Status 03/30/2021 FINAL  Final     Time coordinating discharge: 34mns  SIGNED:   JKathie Dike MD  [facility] 03/30/2021, 10:26 PM   If 7PM-7AM, please contact night-coverage [email or web address]

## 2021-03-30 NOTE — Procedures (Signed)
PreOperative Dx: Cirrhosis, ascites Postoperative Dx: Cirrhosis, ascites Procedure:   US guided paracentesis Radiologist:  Syrita Dovel Anesthesia:  10 ml of1% lidocaine Specimen:  3.2 L of yellow ascitic fluid EBL:   < 1 ml Complications: None  

## 2021-03-30 NOTE — Progress Notes (Signed)
PT tolerated right sided paracentesis procedure well today and 3.2 Liters of clear yellow fluid removed with labs collected and sent for processing. PT transported via wheelchair back to inpatient bed assignment at this time with no acute distress noted and verbalized understanding of discharge instructions.  ?

## 2021-03-30 NOTE — Progress Notes (Signed)
? ? ?Subjective: ?Abdominal distention better compared to admission s/p paracentesis on 2/28.  Felt her abdominal distention started to increase slightly yesterday prior to starting fluid pills.  Also feel her abdomen gets a little distended after she eats.  Abdominal pain is improving.  Felt better after paracentesis.  About 5/10 in severity. ? ?Had a BM this morning. No black stool. Little blood in her stool. Best BM she has had in a while.  ? ?Eating well, no nausea or vomiting.  ?Wants to have colonoscopy outpatient. ?Asking about life expectancy. ? ?Objective: ?Vital signs in last 24 hours: ?Temp:  [97.5 ?F (36.4 ?C)-98.4 ?F (36.9 ?C)] 98.2 ?F (36.8 ?C) (03/02 2150) ?Pulse Rate:  [77-83] 83 (03/02 2150) ?Resp:  [16-20] 16 (03/02 2150) ?BP: (100-126)/(49-88) 124/83 (03/02 2150) ?SpO2:  [97 %-100 %] 100 % (03/02 2150) ?Weight:  [68.9 kg] 68.9 kg (03/02 1112) ?  ?General:   Alert and oriented, pleasant, no acute distress. ?Head:  Normocephalic and atraumatic. ?Eyes: Slight scleral icterus.Marland Kitchen  ?Abdomen: Distended, but nontense and nontender. Palpable firm liver well below right costal margin. Normoactive bowel sounds.  Small, soft, reducible epigastric incisional hernia.   ?Msk:  Symmetrical without gross deformities. Normal posture. ?Extremities:  With slight ankle edema in RLE.  ?Neurologic:  Alert and  oriented x4;  grossly normal neurologically. ?Skin:  Warm and dry, intact without significant lesions.  ?Psych: Normal mood and affect. ? ?Intake/Output from previous day: ?03/02 0701 - 03/03 0700 ?In: 820 [P.O.:720; I.V.:100] ?Out: -  ?Intake/Output this shift: ?No intake/output data recorded. ? ?Lab Results: ?Recent Labs  ?  03/28/21 ?1010 03/28/21 ?1715 03/29/21 ?0435 03/30/21 ?3382  ?WBC 4.4  --  4.8 5.5  ?HGB 6.9* 8.7* 7.7* 8.2*  ?HCT 22.3* 28.3* 25.9* 27.3*  ?PLT 165  --  165 158  ? ?BMET ?Recent Labs  ?  03/28/21 ?1010 03/29/21 ?0435 03/30/21 ?5053  ?NA 133* 135 135  ?K 4.2 3.7 4.3  ?CL 102 104 104  ?CO2 24  24 26   ?GLUCOSE 125* 120* 108*  ?BUN <5* <5* <5*  ?CREATININE 0.60 0.57 0.61  ?CALCIUM 7.7* 7.7* 8.0*  ? ?LFT ?Recent Labs  ?  03/28/21 ?1010 03/29/21 ?0927 03/30/21 ?05/30/21  ?PROT 5.4* 5.4* 5.4*  ?ALBUMIN 2.8* 2.6* 2.5*  ?AST 87* 84* 73*  ?ALT 13 15 14   ?ALKPHOS 83 90 90  ?BILITOT 4.7* 3.9* 2.4*  ?BILIDIR  --  1.7*  --   ?IBILI  --  2.2*  --   ? ?PT/INR ?Recent Labs  ?  03/28/21 ?1756 03/29/21 ?0927  ?LABPROT 22.1* 21.0*  ?INR 1.9* 1.8*  ? ?Hepatitis Panel ?Recent Labs  ?  03/28/21 ?0434  ?HEPBSAG See Scanned report in [place] Link*  ?HCVAB See Scanned report in [place] Link*  ?HEPAIGM See Scanned report in [place] Link*  ?HEPBIGM See Scanned report in [place] Link*  ? ? ?Assessment: ?41 year old female with history of alcohol abuse, chronic anemia receiving iron infusions per hematology, anxiety, who presented with 3-week history of increasing abdominal distention, abdominal pain, and also reported melanotic stools and some hematochezia.  Recent CT on 03/21/2021 with findings highly suggestive of cirrhosis with moderate ascites and splenomegaly, new enlargement of lateral segment left lobe liver.  Also with new, nonspecific hypervascular hypoechoic lesions within right lobe liver.  In the ED, she was found to have a hemoglobin of 6.5, elevated AST, bilirubin, INR, admitted with acute on chronic anemia and decompensated cirrhosis, GI consulted for further  evaluation and management. ? ?Cirrhosis: ?New diagnosis. Previously with hepatic steatosis on imaging in 2021. MELD Na 20.  Decompensated with tense ascites with associated abdominal pain.  No encephalopathy. Underwent paracentesis associated elevated AST (83) and bilirubin (3.6). Etiology not entirely clear.  She does have history of chronic alcohol abuse, but quit drinking about 6 months ago.  No history of IV or intranasal drug use.  Extensive serologic work-up ordered with AIH serologies negative, acute hepatitis panel negative without immunity to  hep A or hep B, ceruloplasmin low at 11.5.  AFP 3.1.  She underwent paracentesis on 2/28 yielding 3.5 L, Gram stain with gram-positive cocci suspected to be a contaminant as culture has been negative x3 days and PMN's 0.42.  She has been on Rocephin, but we will discontinue this as Gram stain is likely contaminant.  She was started on spironolactone 50 mg and Lasix 20 mg yesterday.  Ascites improving, but still with large volume ascites on exam, but abdomen is nontense.  Abdominal pain improving and likely secondary to her ascites.  We will plan for repeat paracentesis today and increase of diuretics as her blood pressure, electrolytes, and kidney function have remained stable.  She will need 24-hour urine copper to follow-up on low ceruloplasmin. ? ?Child Pugh C ?MELD: 19 ? ?Liver lesion: ?Recent CT 2/22 with nonspecific hypervascular hypoechoic lesions within right lobe of uncertain etiology with recommendations for MRI.  AFP 3.1 this admission.  Recommend outpatient MRI liver with and without contrast. ? ?Acute on chronic anemia:  ?Chronic IDA with hemoglobin typically in the 8-10 range, receiving IV iron with hematology.  Previously with significant menorrhagia s/p hysterectomy in 2020. Hemoglobin 9.5 in January 2023, 6.5 on admission.  She reported black stools for the last 2 weeks with last episode 2 days prior to admission along with chronic intermittent hematochezia in the setting of known external hemorrhoids.  Also with daily ibuprofen recently for abdominal pain.   Iron panel with ferritin 8, iron 27, saturation 16%.  Folate low at 4.1, now receiving supplementation.  B12 elevated. Received 3 units PRBCs this admission, hemoglobin stable/slightly improved today at 8.2, up from 7.7 yesterday.  EGD yesterday with grade 1 esophageal varices, portal hypertensive gastropathy with friable tissue with spontaneous ooze bypassing the scope, normal examined duodenum.  Recommended colonoscopy, but patient requested  to pursue this outpatient.  Her last colonoscopy was in 2015 with external hemorrhoids, normal TI.  ? ? ? ?Plan: ?INR ?Repeat paracentesis today.  Max 8 L.  Will obtain labs to reevaluate for SBP though less likely. ?Stop Rocephin. ?Increase spironolactone to 100 mg daily and Lasix to 40 mg daily. ?Consider NSBB for primary prophylaxis for varices.  We will need to discuss with Dr. Levon Hedger.  Likely consider starting outpatient as diuretics are being increased today.  Want to ensure no hypotension. ?Continue PPI twice daily. ?Outpatient colonoscopy. ?2 g sodium diet. ?Follow-up with hematology for ongoing IV iron supplementation. ?Continue folate supplementation. ?Monitor H&H and for overt GI bleeding. ?Avoid NSAIDs. ?From a GI standpoint, would be okay to DC home this evening after paracentesis and additional diuretics are given if blood pressure continues to remain stable.   ?She will need close follow-up outpatient and repeat labs next week, 24 hour urine copper, MRI liver with and without contrast.  ? ? ? ? LOS: 3 days  ? ? 03/30/2021, 7:34 AM ? ? ?Ermalinda Memos, PA-C ?[facility]  ?

## 2021-04-01 NOTE — Telephone Encounter (Signed)
Patient was discharged from the hospital on 03/30/21.  ? ?Courtney:  ?Please arrange BMP for 04/05/21. Dx: Cirrhosis.  ?Please also arrange 24 hour urine copper. Dx: Low ceruloplasmin, cirrhosis.  ? ? ?Stacey:  ?If I do not have any openings, would be appropriate to schedule her with any APP or Dr. Jena Gauss.  ?

## 2021-04-02 ENCOUNTER — Other Ambulatory Visit: Payer: Self-pay | Admitting: *Deleted

## 2021-04-02 DIAGNOSIS — K7460 Unspecified cirrhosis of liver: Secondary | ICD-10-CM

## 2021-04-02 DIAGNOSIS — R188 Other ascites: Secondary | ICD-10-CM

## 2021-04-02 LAB — CULTURE, BODY FLUID W GRAM STAIN -BOTTLE
Culture: NO GROWTH
Special Requests: ADEQUATE

## 2021-04-02 NOTE — Telephone Encounter (Signed)
Spoke to pt, informed her to have labs drawn this week. Pt voiced understanding. Labs entered into Epic.  ?

## 2021-04-02 NOTE — Progress Notes (Signed)
error 

## 2021-04-02 NOTE — Telephone Encounter (Signed)
Noted  

## 2021-04-03 ENCOUNTER — Encounter (HOSPITAL_COMMUNITY): Payer: Self-pay | Admitting: Internal Medicine

## 2021-04-04 LAB — CULTURE, BODY FLUID W GRAM STAIN -BOTTLE: Culture: NO GROWTH

## 2021-04-05 ENCOUNTER — Other Ambulatory Visit (HOSPITAL_COMMUNITY)
Admission: RE | Admit: 2021-04-05 | Discharge: 2021-04-05 | Disposition: A | Discharge status: Home / Self Care | Payer: Medicaid Other | Source: Ambulatory Visit | Attending: Gastroenterology | Admitting: Gastroenterology

## 2021-04-05 DIAGNOSIS — R188 Other ascites: Secondary | ICD-10-CM | POA: Diagnosis present

## 2021-04-05 DIAGNOSIS — K7460 Unspecified cirrhosis of liver: Secondary | ICD-10-CM | POA: Diagnosis not present

## 2021-04-05 LAB — BASIC METABOLIC PANEL
Anion gap: 7 (ref 5–15)
BUN: 6 mg/dL (ref 6–20)
CO2: 29 mmol/L (ref 22–32)
Calcium: 8 mg/dL — ABNORMAL LOW (ref 8.9–10.3)
Chloride: 96 mmol/L — ABNORMAL LOW (ref 98–111)
Creatinine, Ser: 0.51 mg/dL (ref 0.44–1.00)
GFR, Estimated: >60 mL/min (ref >60)
Glucose, Bld: 124 mg/dL — ABNORMAL HIGH (ref 70–99)
Potassium: 3.5 mmol/L (ref 3.5–5.1)
Sodium: 132 mmol/L — ABNORMAL LOW (ref 135–145)

## 2021-04-05 LAB — PATHOLOGIST SMEAR REVIEW

## 2021-04-06 ENCOUNTER — Other Ambulatory Visit: Payer: Self-pay | Admitting: Gastroenterology

## 2021-04-06 DIAGNOSIS — K7460 Unspecified cirrhosis of liver: Secondary | ICD-10-CM

## 2021-04-06 DIAGNOSIS — E871 Hypo-osmolality and hyponatremia: Secondary | ICD-10-CM

## 2021-04-06 DIAGNOSIS — R188 Other ascites: Secondary | ICD-10-CM

## 2021-04-07 LAB — COPPER, URINE - RANDOM OR 24 HOUR
Copper / Creatinine Ratio: 13 ug/g creat (ref 0–49)
Copper, Ur: 20 ug/L
Creatinine(Crt),U: 1.57 g/L (ref 0.30–3.00)

## 2021-04-09 ENCOUNTER — Other Ambulatory Visit (HOSPITAL_COMMUNITY)
Admission: RE | Admit: 2021-04-09 | Discharge: 2021-04-09 | Disposition: A | Discharge status: Home / Self Care | Payer: Medicaid Other | Source: Ambulatory Visit | Attending: Gastroenterology | Admitting: Gastroenterology

## 2021-04-09 DIAGNOSIS — K7460 Unspecified cirrhosis of liver: Secondary | ICD-10-CM | POA: Diagnosis present

## 2021-04-09 DIAGNOSIS — R188 Other ascites: Secondary | ICD-10-CM | POA: Insufficient documentation

## 2021-04-09 LAB — BASIC METABOLIC PANEL
Anion gap: 8 (ref 5–15)
BUN: 7 mg/dL (ref 6–20)
CO2: 27 mmol/L (ref 22–32)
Calcium: 8.3 mg/dL — ABNORMAL LOW (ref 8.9–10.3)
Chloride: 97 mmol/L — ABNORMAL LOW (ref 98–111)
Creatinine, Ser: 0.59 mg/dL (ref 0.44–1.00)
GFR, Estimated: >60 mL/min (ref >60)
Glucose, Bld: 122 mg/dL — ABNORMAL HIGH (ref 70–99)
Potassium: 3.5 mmol/L (ref 3.5–5.1)
Sodium: 132 mmol/L — ABNORMAL LOW (ref 135–145)

## 2021-04-17 ENCOUNTER — Encounter: Payer: Self-pay | Admitting: *Deleted

## 2021-04-17 ENCOUNTER — Other Ambulatory Visit: Payer: Self-pay

## 2021-04-17 ENCOUNTER — Encounter: Payer: Self-pay | Admitting: Internal Medicine

## 2021-04-17 ENCOUNTER — Telehealth: Payer: Self-pay | Admitting: *Deleted

## 2021-04-17 ENCOUNTER — Ambulatory Visit: Payer: Medicaid Other | Admitting: Internal Medicine

## 2021-04-17 VITALS — BP 138/64 | HR 77 | Temp 100.0°F | Ht 66.5 in | Wt 140.0 lb

## 2021-04-17 DIAGNOSIS — R935 Abnormal findings on diagnostic imaging of other abdominal regions, including retroperitoneum: Secondary | ICD-10-CM

## 2021-04-17 DIAGNOSIS — K7460 Unspecified cirrhosis of liver: Secondary | ICD-10-CM

## 2021-04-17 DIAGNOSIS — E871 Hypo-osmolality and hyponatremia: Secondary | ICD-10-CM

## 2021-04-17 NOTE — Progress Notes (Signed)
? ? ?Primary Care Physician:  Toma Deiters, MD ?Primary Gastroenterologist:  Dr. Jena Gauss ? ?Pre-Procedure History & Physical: ?HPI:  Erica Banks is a 41 y.o. female here for follow-up hospitalization for decompensated chronic liver disease.  Admitted to the hospital last month with GI bleed (hematochezia); required 3 unit transfusion packed RBCs.  Also, ascites; LVP x2.  Culture and white count negative for SBP.  Gram stain positive.  Treated with antibiotics.  EGD demonstrated minimal varices and portal gastropathy.  2 hypervascular lesions on CT not present on CT 1 year ago.  Doppler shows patent portal venous system. ?Work-up for autoimmune viral hepatitis negative.  Ceruloplasmin was low.  Father had EtOH related cirrhosis no other history of liver disease in the family. ? ?Patient endorses heavy alcohol use starting at age 76.  She has not consumed any since February 14.  She is not on a nonselective beta-blocker at this time.  She is taking Lasix spironolactone. ? ?MELD calculated to be 20 during her hospitalization. ? ?Patient says no EtOH since February 14.  Wants to achieve long-term sobriety.  Not getting any third-party help as of yet. ? ?Based on record review, was not concluded the patient had acutely bled from her upper GI tract.  Possibly multifactorial in etiology.  Has known significant hemorrhoids. ? ?Colonoscopy 2015-hemorrhoids. ? ? ?Past Medical History:  ?Diagnosis Date  ? Anemia   ? DVT (deep venous thrombosis) (HCC)   ? Low iron   ? Neuropathy   ? Panic attacks   ? Pulmonary embolus (HCC)   ? ? ?Past Surgical History:  ?Procedure Laterality Date  ? ABDOMINAL HYSTERECTOMY    ? BIOPSY N/A 06/24/2013  ? Procedure: BIOPSY TERMINAL ILEUM;  Surgeon: Corbin Ade, MD;  Location: [facility];  Service: Endoscopy;  Laterality: N/A;  ? CHOLECYSTECTOMY    ? COLONOSCOPY N/A 06/24/2013  ? DPT:ELMRAJHH hemorrhoids/otherwise normal   ? ESOPHAGOGASTRODUODENOSCOPY (EGD) WITH PROPOFOL N/A 03/29/2021   ? Procedure: ESOPHAGOGASTRODUODENOSCOPY (EGD) WITH PROPOFOL;  Surgeon: Lanelle Bal, DO;  Location: [facility];  Service: Endoscopy;  Laterality: N/A;  ? nasal bone surgery    ? NASAL SINUS SURGERY    ? SINUS IRRIGATION    ? TONSILLECTOMY    ? TUBAL LIGATION    ? ? ?Prior to Admission medications   ?Medication Sig Start Date End Date Taking? Authorizing Provider  ?furosemide (LASIX) 40 MG tablet Take 1 tablet (40 mg total) by mouth daily. 03/31/21  Yes Erick Blinks, MD  ?gabapentin (NEURONTIN) 300 MG capsule Take 300 mg by mouth 3 (three) times daily. 02/25/21  Yes [provider]  ?pantoprazole (PROTONIX) 40 MG tablet Take 1 tablet (40 mg total) by mouth 2 (two) times daily before a meal. 03/30/21  Yes Erick Blinks, MD  ?spironolactone (ALDACTONE) 100 MG tablet Take 1 tablet (100 mg total) by mouth daily. 03/31/21  Yes Erick Blinks, MD  ?traMADol (ULTRAM) 50 MG tablet Take 1 tablet (50 mg total) by mouth every 6 (six) hours as needed. ?Patient not taking: Reported on 04/17/2021 03/30/21 03/30/22  Erick Blinks, MD  ? ? ?Allergies as of 04/17/2021 - Review Complete 04/17/2021  ?Allergen Reaction Noted  ? Demerol Anaphylaxis 10/12/2010  ? ? ?Family History  ?Problem Relation Age of Onset  ? Diabetes Mother   ? Hypertension Mother   ? COPD Mother   ? Cirrhosis Father   ?     ETOH  ? Diabetes Sister   ? Hypertension Sister   ?  Crohn's disease Maternal Grandmother   ? Cancer Other   ? Colon cancer Neg Hx   ? Autoimmune disease Neg Hx   ? ? ?Social History  ? ?Socioeconomic History  ? Marital status: Married  ?  Spouse name: Not on file  ? Number of children: Not on file  ? Years of education: Not on file  ? Highest education level: Not on file  ?Occupational History  ? Occupation: Unemployed  ?  Comment: wants to go to nursing school in fall 2015  ?Tobacco Use  ? Smoking status: Every Day  ?  Packs/day: 0.50  ?  Years: 12.00  ?  Pack years: 6.00  ?  Types: Cigarettes  ? Smokeless tobacco: Never  ?Vaping  Use  ? Vaping Use: Never used  ?Substance and Sexual Activity  ? Alcohol use: Not Currently  ?  Alcohol/week: 4.0 - 5.0 standard drinks  ?  Types: 4 - 5 Cans of beer per week  ?  Comment: occ  ? Drug use: Yes  ?  Types: Marijuana  ?  Comment: occasional  ? Sexual activity: Yes  ?  Birth control/protection: Surgical  ?Other Topics Concern  ? Not on file  ?Social History Narrative  ? Not on file  ? ?Social Determinants of Health  ? ?Financial Resource Strain: Not on file  ?Food Insecurity: Not on file  ?Transportation Needs: Not on file  ?Physical Activity: Not on file  ?Stress: Not on file  ?Social Connections: Not on file  ?Intimate Partner Violence: Not on file  ? ? ?Review of Systems: ?See HPI, otherwise negative ROS ? ?Physical Exam: ?BP 138/64 (BP Location: Right Arm, Patient Position: Sitting, Cuff Size: Normal)   Pulse 77   Temp 100 ?F (37.8 ?C) (Temporal)   Ht 5' 6.5" (1.689 m)   Wt 140 lb (63.5 kg)   LMP  (LMP Unknown)   SpO2 100%   BMI 22.26 kg/m?  ?General:   Alert, tearful and anxious.  Pleasant and cooperative in NAD ?Tattoo present. ?Eyes:  Sclera clear, no icterus.   Conjunctiva pink. ?Ears:  Normal auditory acuity. ?Nose:  No deformity, discharge,  or lesions. ?Mouth:  No deformity or lesions. ?Neck:  Supple; no masses or thyromegaly. No significant cervical adenopathy. ?Lungs:  Clear throughout to auscultation.   No wheezes, crackles, or rhonchi. No acute distress. ?Heart:  Regular rate and rhythm; no murmurs, clicks, rubs,  or gallops. ?Abdomen: Abdomen full.  Mild shifting dullness fluid wave.  Left lobe the liver palpable below left costal margin.  Bulge mid abdomen.  Focally tender.  Spleen is ballotable. ?Pulses:  Normal pulses noted. ?Extremities:  Without clubbing or edema. ?Rectal: Generous grade 3 hemorrhoids.  No mass in the rectal vault.  Rectal vault is empty. ? ?Impression/Plan: 41 year old lady with decompensated cirrhosis recently admitted with GI bleed and ascites.  Labs  negative for SBP.  She was treated empirically. ?Portal gastropathy and small varices on EGD.  Evidence of portal hypertension on Doppler/CT.  Suspicious lesion seen on cross-sectional imaging. ?Ceruloplasmin low.  Viral, autoimmune markers negative. ?In large part, I suspect her advanced liver disease is secondary to longstanding heavy alcohol use. ?Discussed the importance of alcohol cessation moving forward.  The possibility of eventual liver transplant also reviewed. ?She may benefit from going on a nonselective beta-blocker. ?Clinically, appears close to euvolemic today. ? ? ?Recommendations: ? ?2 g sodium diet. ? ?Chem-12, CBC, AFP, INR, repeat serum ceruloplasmin ? ?Twin Rx to protect against hepatitis  A and B ? ?No change in medication regimen for the time being. ? ?We will schedule a both a repeat EGD and a diagnostic colonoscopy-recent GI bleed in a cirrhotic.  ASA 3. ? ?We will decide about NSSB in the near future ? ?Third-party help for EtOH abstinence recommended. ? ?Avoid NSAIDs; okay to take acetaminophen; no more than 2 g daily. ? ?MRI of her liver to follow-up on previously noted lesions on CT. ? ?Office visit here in 2 months ? ? ? ?Notice: This dictation was prepared with Dragon dictation along with smaller phrase technology. Any transcriptional errors that result from this process are unintentional and may not be corrected upon review.  ?

## 2021-04-17 NOTE — Patient Instructions (Signed)
It was good to see you again today!. ? ?No more alcohol for the rest of your life!Marland Kitchen  Your life depends on it! ? ?2 g sodium diet. ? ?Chem-12, CBC, AFP, INR, serum ceruloplasmin ? ?Twin Rx to protect against hepatitis a and B ? ?No change in medication regimen for the time being. ? ?We will schedule a both a repeat EGD and a diagnostic colonoscopy-recent GI bleed in a cirrhotic.  ASA 3. ? ?Third-party help for EtOH abstinence recommended ? ?MRI of her liver to follow-up on previously noted lesions on CT. ? ?Office visit here in 3 months ? ?

## 2021-04-17 NOTE — Telephone Encounter (Signed)
PA submitted via radmd. Tracking # [id number] ?

## 2021-04-18 LAB — AFP TUMOR MARKER: AFP-Tumor Marker: 4.6 ng/mL

## 2021-04-18 LAB — COMPREHENSIVE METABOLIC PANEL
AG Ratio: 0.9 (calc) — ABNORMAL LOW (ref 1.0–2.5)
ALT: 18 U/L (ref 6–29)
AST: 73 U/L — ABNORMAL HIGH (ref 10–30)
Albumin: 2.9 g/dL — ABNORMAL LOW (ref 3.6–5.1)
Alkaline phosphatase (APISO): 103 U/L (ref 31–125)
BUN: 7 mg/dL (ref 7–25)
CO2: 28 mmol/L (ref 20–32)
Calcium: 8.5 mg/dL — ABNORMAL LOW (ref 8.6–10.2)
Chloride: 102 mmol/L (ref 98–110)
Creat: 0.57 mg/dL (ref 0.50–0.97)
Globulin: 3.3 g/dL (calc) (ref 1.9–3.7)
Glucose, Bld: 97 mg/dL (ref 65–99)
Potassium: 3.8 mmol/L (ref 3.5–5.3)
Sodium: 136 mmol/L (ref 135–146)
Total Bilirubin: 2.7 mg/dL — ABNORMAL HIGH (ref 0.2–1.2)
Total Protein: 6.2 g/dL (ref 6.1–8.1)

## 2021-04-18 LAB — CBC WITH DIFFERENTIAL/PLATELET
Absolute Monocytes: 290 cells/uL (ref 200–950)
Basophils Absolute: 30 cells/uL (ref 0–200)
Basophils Relative: 0.9 %
Eosinophils Absolute: 50 cells/uL (ref 15–500)
Eosinophils Relative: 1.5 %
HCT: 25.1 % — ABNORMAL LOW (ref 35.0–45.0)
Hemoglobin: 8.4 g/dL — ABNORMAL LOW (ref 11.7–15.5)
Lymphs Abs: 917 cells/uL (ref 850–3900)
MCH: 29.8 pg (ref 27.0–33.0)
MCHC: 33.5 g/dL (ref 32.0–36.0)
MCV: 89 fL (ref 80.0–100.0)
MPV: 10.5 fL (ref 7.5–12.5)
Monocytes Relative: 8.8 %
Neutro Abs: 2013 cells/uL (ref 1500–7800)
Neutrophils Relative %: 61 %
Platelets: 222 10*3/uL (ref 140–400)
RBC: 2.82 10*6/uL — ABNORMAL LOW (ref 3.80–5.10)
RDW: 15.6 % — ABNORMAL HIGH (ref 11.0–15.0)
Total Lymphocyte: 27.8 %
WBC: 3.3 10*3/uL — ABNORMAL LOW (ref 3.8–10.8)

## 2021-04-18 LAB — PROTIME-INR
INR: 1.3 — ABNORMAL HIGH
Prothrombin Time: 13 s — ABNORMAL HIGH (ref 9.0–11.5)

## 2021-04-18 LAB — CERULOPLASMIN: Ceruloplasmin: 20 mg/dL (ref 18–53)

## 2021-04-19 ENCOUNTER — Other Ambulatory Visit: Payer: Self-pay

## 2021-04-19 DIAGNOSIS — K7460 Unspecified cirrhosis of liver: Secondary | ICD-10-CM

## 2021-04-19 NOTE — Telephone Encounter (Signed)
PA approved. [id number], DOS: 04/17/2021-05/17/2021 ?

## 2021-04-24 NOTE — Progress Notes (Signed)
Spoke with pt and she is going today to pick up the container.  ?

## 2021-04-28 LAB — COPPER, URINE, 24 HOUR
Copper,Urine (24 Hr): <5 mcg/24 h — ABNORMAL LOW (ref 15–60)
Total Volume: 1000 mL

## 2021-05-02 ENCOUNTER — Encounter: Payer: Self-pay | Admitting: *Deleted

## 2021-05-02 ENCOUNTER — Telehealth: Payer: Self-pay | Admitting: *Deleted

## 2021-05-02 MED ORDER — CLENPIQ 10-3.5-12 MG-GM -GM/160ML PO SOLN
1.0000 | Freq: Once | ORAL | 0 refills | Status: AC
Start: 2021-05-02 — End: 2021-05-02

## 2021-05-02 NOTE — Telephone Encounter (Signed)
Called pt. She has been scheduled for TCS/EGD w/ propofol asa 3 on 5/4 at 12:30pm. Aware will mail prep instructions/pre-op. Will send rx to pharmacy.  ?

## 2021-05-03 ENCOUNTER — Ambulatory Visit: Payer: Medicaid Other | Admitting: Internal Medicine

## 2021-05-03 ENCOUNTER — Ambulatory Visit (HOSPITAL_COMMUNITY)
Admission: RE | Admit: 2021-05-03 | Discharge: 2021-05-03 | Disposition: A | Discharge status: Home / Self Care | Payer: Medicaid Other | Source: Ambulatory Visit | Attending: Internal Medicine | Admitting: Internal Medicine

## 2021-05-03 DIAGNOSIS — R935 Abnormal findings on diagnostic imaging of other abdominal regions, including retroperitoneum: Secondary | ICD-10-CM | POA: Insufficient documentation

## 2021-05-03 IMAGING — MR MR ABDOMEN WO/W CM
20 series · 48 of 48 positions shown · IV contrast (Gadavist)
Comparison: Multiple priors including CT [DATE].

CLINICAL DATA: Follow-up possible lesion seen on prior CT.

EXAM:
MRI ABDOMEN WITHOUT AND WITH CONTRAST
TECHNIQUE: Multiplanar multisequence MR imaging of the abdomen was performed
both before and after the administration of intravenous contrast.
CONTRAST:  7mL GADAVIST GADOBUTROL 1 MMOL/ML IV SOLN

[Series 3: cor haste · coronal · 6.0mm · 1.25mm/px · 1 of 32 slices shown]
[im 1/32]
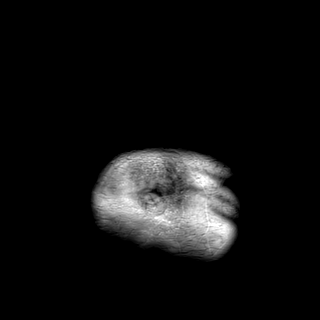

[Series 4: ax haste · axial · 6.0mm · 1.19mm/px · 1 of 44 slices shown]
[im 1/44]
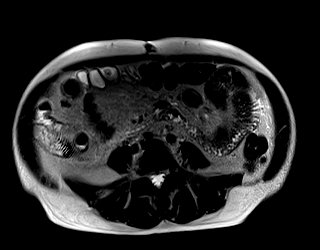

[Series 5: T2 fat-sat · axial · 6.0mm · 1.19mm/px · 1 of 42 slices shown]
[im 1/42]
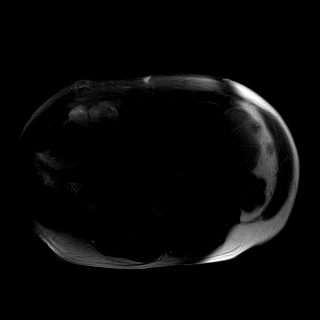

[Series 6: DWI · axial · 6.0mm · 1.42mm/px · z∈[-92,+261]mm · 2 of 50 slices shown (1 of 4)]
[im 1/50]
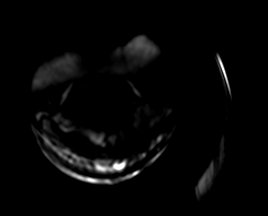
[im 50/50]
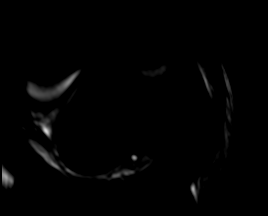

[Series 6: DWI · axial · 6.0mm · 1.42mm/px · z∈[-92,+261]mm · 2 of 50 slices shown (2 of 4)]
[im 1/50]
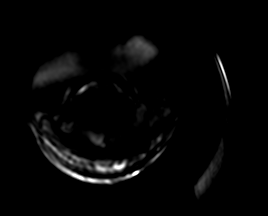
[im 50/50]
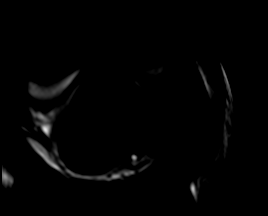

[Series 6: DWI · axial · 6.0mm · 1.42mm/px · z∈[-92,+261]mm · 2 of 50 slices shown (3 of 4)]
[im 1/50]
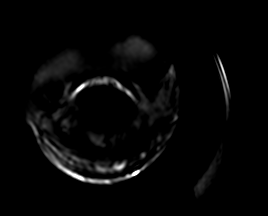
[im 50/50]
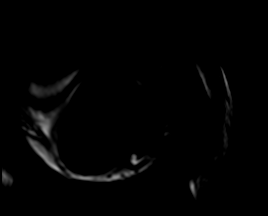

[Series 7: DWI · axial · 6.0mm · 1.42mm/px · z∈[-92,+261]mm · 2 of 50 slices shown (4 of 4)]
[im 1/50]
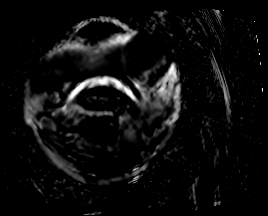
[im 50/50]
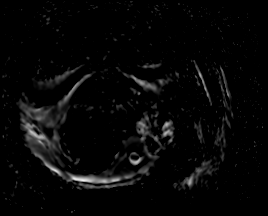

[Series 8: bSSFP · axial · 6.0mm · 0.74mm/px · z∈[-62,+232]mm · 2 of 50 slices shown]
[im 1/50]
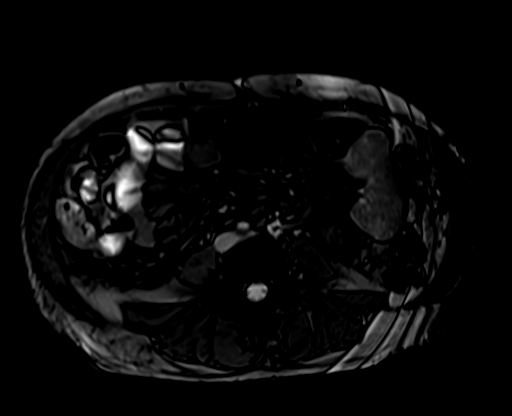
[im 50/50]
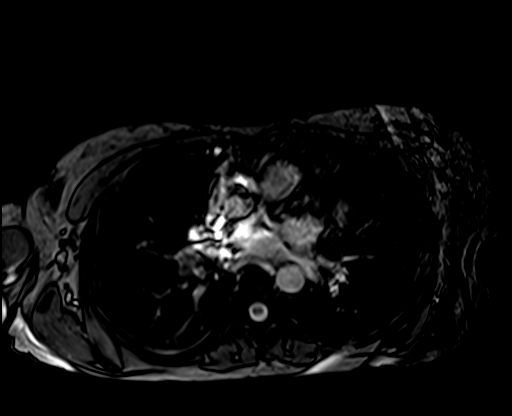

[Series 9: ax in and · axial · 3.0mm · 1.19mm/px · z∈[-69,+240]mm · 3 of 104 slices shown (1 of 2)]
[im 1/104]
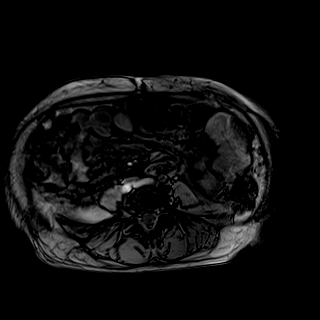
[im 52/104]
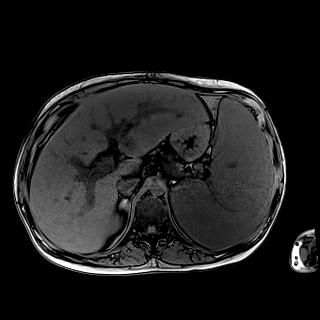
[im 104/104]
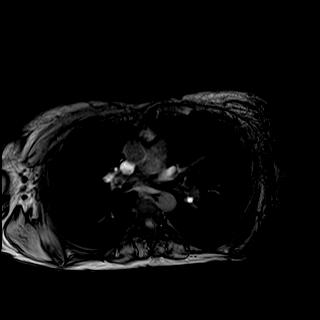

[Series 10: ax in and · axial · 3.0mm · 1.19mm/px · z∈[-69,+240]mm · 3 of 104 slices shown (2 of 2)]
[im 1/104]
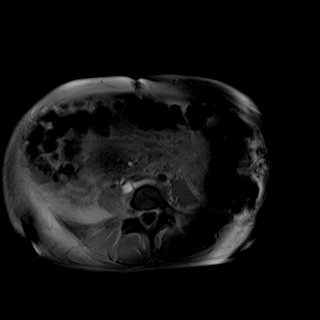
[im 52/104]
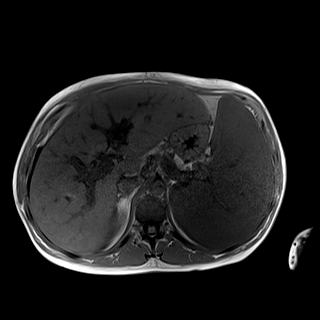
[im 104/104]
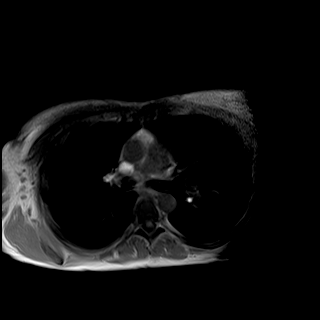

[Series 11: T1 dynamic · axial · non-contrast · 3.0mm · 1.19mm/px · z∈[-69,+240]mm · 3 of 104 slices shown (1 of 4)]
[im 1/104]
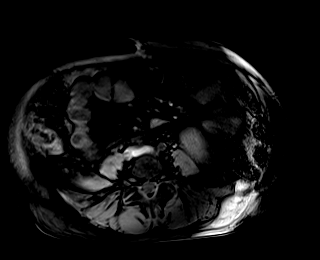
[im 52/104]
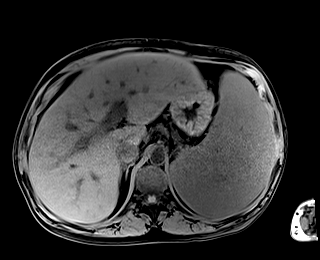
[im 104/104]
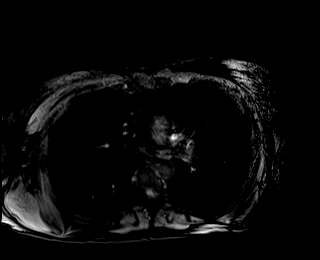

[Series 13: T1 dynamic post-contrast · axial · 3.0mm · 1.19mm/px · z∈[-69,+240]mm · 3 of 104 slices shown (1 of 6)]
[im 1/104]
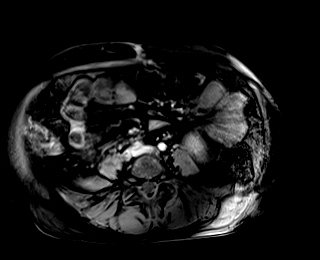
[im 52/104]
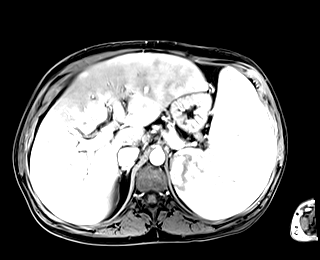
[im 104/104]
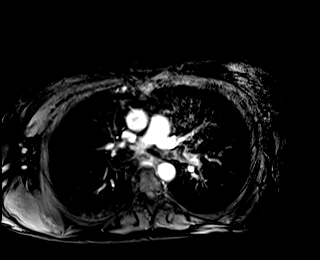

[Series 14: T1 dynamic · axial · 3.0mm · 1.19mm/px · z∈[-69,+240]mm · 3 of 104 slices shown (2 of 4)]
[im 1/104]
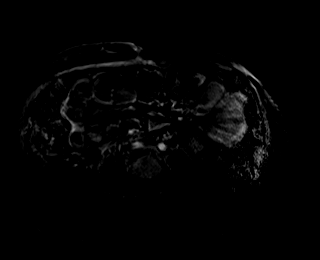
[im 52/104]
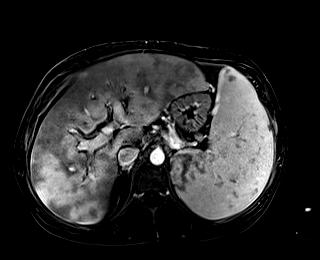
[im 104/104]
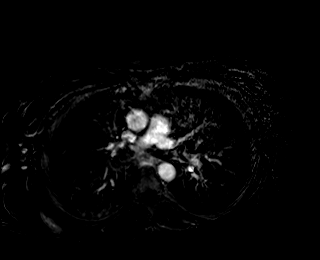

[Series 15: T1 dynamic post-contrast · axial · 3.0mm · 1.19mm/px · z∈[-69,+240]mm · 3 of 104 slices shown (2 of 6)]
[im 1/104]
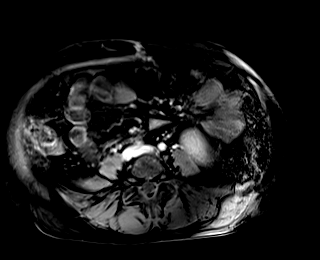
[im 52/104]
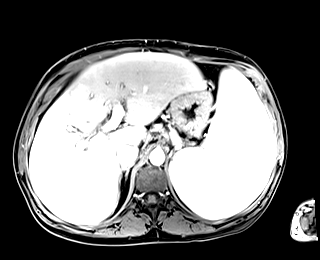
[im 104/104]
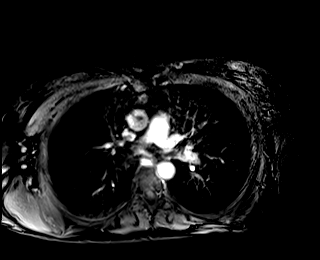

[Series 16: T1 dynamic · axial · 3.0mm · 1.19mm/px · z∈[-69,+240]mm · 3 of 104 slices shown (3 of 4)]
[im 1/104]
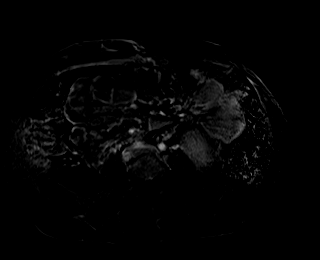
[im 52/104]
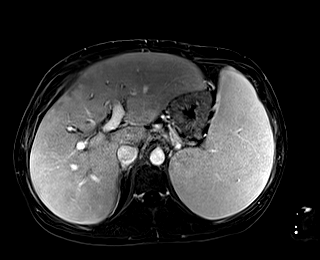
[im 104/104]
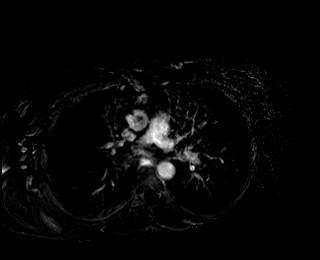

[Series 17: T1 dynamic post-contrast · axial · 3.0mm · 1.19mm/px · z∈[-69,+240]mm · 3 of 104 slices shown (3 of 6)]
[im 1/104]
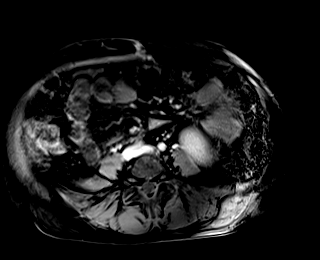
[im 52/104]
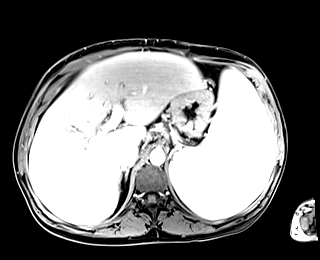
[im 104/104]
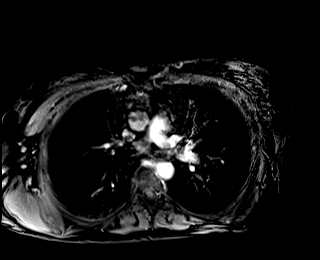

[Series 18: T1 dynamic · axial · 3.0mm · 1.19mm/px · z∈[-69,+240]mm · 3 of 104 slices shown (4 of 4)]
[im 1/104]
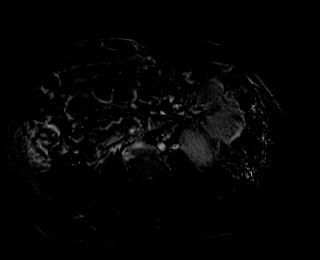
[im 52/104]
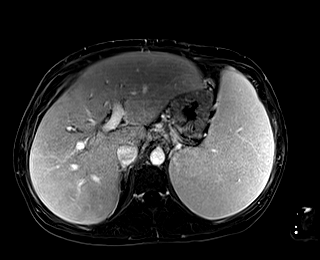
[im 104/104]
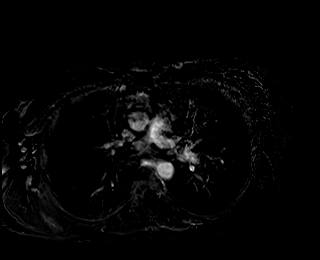

[Series 19: T1 dynamic post-contrast · coronal · 3.0mm · 1.19mm/px · 2 of 80 slices shown (4 of 6)]
[im 1/80]
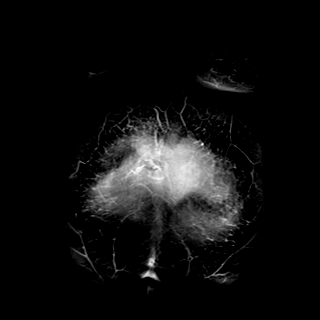
[im 80/80]
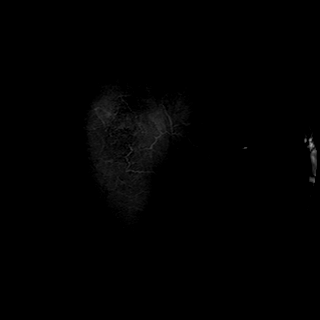

[Series 20: T1 dynamic post-contrast · axial · 3.0mm · 1.19mm/px · z∈[-69,+240]mm · 3 of 104 slices shown (5 of 6)]
[im 1/104]
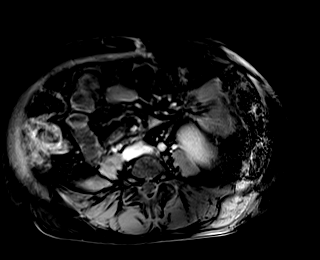
[im 52/104]
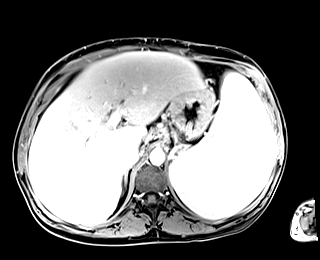
[im 104/104]
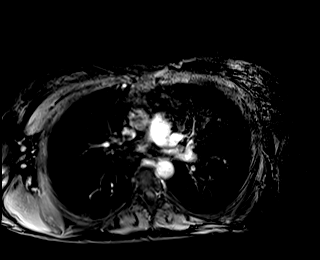

[Series 21: T1 dynamic post-contrast · axial · 3.0mm · 1.19mm/px · z∈[-69,+240]mm · 3 of 104 slices shown (6 of 6)]
[im 1/104]
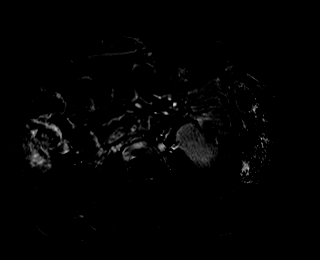
[im 52/104]
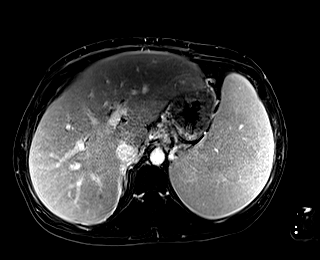
[im 104/104]
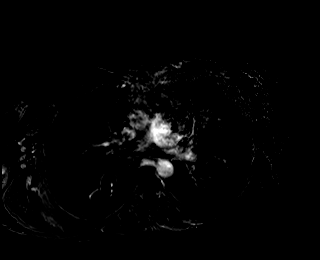

[48 of 48 positions shown; findings below may reference images not displayed]

FINDINGS: Lower chest: Hypoventilatory change in the dependent lungs with
trace bilateral pleural effusions.

Hepatobiliary: Hepatomegaly measuring 22.3 cm in maximum
craniocaudal dimension. Heterogeneous arterial enhancement of the
hepatic parenchyma postcontrast administration which equilibrates
over time with homogeneous appearance on delayed imaging. Arterially
enhancing 6 mm mm focus in the inferior right lobe of the liver on
image 84/13 which does not demonstrate washout and persists on
delayed phase of contrast with intensity greater than that of
background liver on 88/20.

Gallbladder surgically absent.  No biliary ductal dilation.

Pancreas: No pancreatic ductal dilation or evidence of acute
inflammation.

Spleen: Increased splenomegaly measuring 20.9 x 18.3 x 8.7 cm
(volume = [XP] cm^3) previously with a volume of [XP] cc.

Adrenals/Urinary Tract: Bilateral adrenal glands appear normal. No
hydronephrosis. No solid enhancing renal mass.

Stomach/Bowel: Mild ascending/hepatic flexure colonic wall
thickening is similar prior and likely reflects portal colopathy. No
pathologic dilation of small or large bowel. Stomach is unremarkable
for degree of distension.

Vascular/Lymphatic: The portal, splenic and superior mesenteric
veins are patent. Normal caliber abdominal aorta. Smooth IVC
contours. No pathologically enlarged abdominal lymph nodes.

Other:  Trace abdominal free fluid is decreased from prior.

Musculoskeletal: No suspicious bone lesions identified.
IMPRESSION: 1. Arterially enhancing 6 mm focus in the inferior right lobe of the
liver does not demonstrate washout and persists on delayed phase of
contrast in the setting of cirrhosis this would be consistent with MITSUHARU
MITSUHARU category 3 hepatic lesion (intermediate probability of
malignancy) for which a follow-up hepatic protocol abdominal MRI in
3-6 months with and without contrast is suggested.
2. Hepatomegaly with hepatic morphologic changes and abdominopelvic
findings suggestive of cirrhosis.
3. Increased splenomegaly with a splenic volume of [XP] cc
4. Mild colonic wall thickening is similar prior and may reflect
portal colopathy.
5. Trace abdominal ascites is decreased from prior.

## 2021-05-03 MED ORDER — GADOBUTROL 1 MMOL/ML IV SOLN
7.0000 mL | Freq: Once | INTRAVENOUS | Status: AC | PRN
  Administered 2021-05-03: 7 mL via INTRAVENOUS

## 2021-05-28 ENCOUNTER — Telehealth: Payer: Self-pay | Admitting: Internal Medicine

## 2021-05-28 NOTE — Telephone Encounter (Signed)
Pt was made aware and verbalized understanding.  

## 2021-05-28 NOTE — Telephone Encounter (Signed)
Pt was prescribed furosemide 40 mg and spironolactone 100 mg. Do you want to manage these? ?

## 2021-05-28 NOTE — Patient Instructions (Signed)
? ? ? ? ? ? ? ? Erica Banks ? 05/28/2021  ?  ? @PREFPERIOPPHARMACY @ ? ? Your procedure is scheduled on  05/31/2021. ? ? Report to 07/31/2021 at  1100  A.M. ? ? Call this number if you have problems the morning of surgery: ? [phone] ? ? Remember: ? Follow the diet and prep instructions given to you by the office. ?  ? Take these medicines the morning of surgery with A SIP OF WATER  ? ?                             gabapentin, protonix. ?  ? ? Do not wear jewelry, make-up or nail polish. ? Do not wear lotions, powders, or perfumes, or deodorant. ? Do not shave 48 hours prior to surgery.  Men may shave face and neck. ? Do not bring valuables to the hospital. ? [facility] is not responsible for any belongings or valuables. ? ?Contacts, dentures or bridgework may not be worn into surgery.  Leave your suitcase in the car.  After surgery it may be brought to your room. ? ?For patients admitted to the hospital, discharge time will be determined by your treatment team. ? ?Patients discharged the day of surgery will not be allowed to drive home and must have someone with them for 24 hours.  ? ? ?Special instructions:   DO NOT smoke tobacco or vape for 24 hours before your procedure. ? ?Please read over the following fact sheets that you were given. ?Anesthesia Post-op Instructions and Care and Recovery After Surgery ?  ? ? ? Upper Endoscopy, Adult, Care After ?This sheet gives you information about how to care for yourself after your procedure. Your health care provider may also give you more specific instructions. If you have problems or questions, contact your health care provider. ?What can I expect after the procedure? ?After the procedure, it is common to have: ?A sore throat. ?Mild stomach pain or discomfort. ?Bloating. ?Nausea. ?Follow these instructions at home: ? ?Follow instructions from your health care provider about what to eat or drink after your procedure. ?Return to your normal activities as told by  your health care provider. Ask your health care provider what activities are safe for you. ?Take over-the-counter and prescription medicines only as told by your health care provider. ?If you were given a sedative during the procedure, it can affect you for several hours. Do not drive or operate machinery until your health care provider says that it is safe. ?Keep all follow-up visits as told by your health care provider. This is important. ?Contact a health care provider if you have: ?A sore throat that lasts longer than one day. ?Trouble swallowing. ?Get help right away if: ?You vomit blood or your vomit looks like coffee grounds. ?You have: ?A fever. ?Bloody, black, or tarry stools. ?A severe sore throat or you cannot swallow. ?Difficulty breathing. ?Severe pain in your chest or abdomen. ?Summary ?After the procedure, it is common to have a sore throat, mild stomach discomfort, bloating, and nausea. ?If you were given a sedative during the procedure, it can affect you for several hours. Do not drive or operate machinery until your health care provider says that it is safe. ?Follow instructions from your health care provider about what to eat or drink after your procedure. ?Return to your normal activities as told by your health care provider. ?This information is not intended  to replace advice given to you by your health care provider. Make sure you discuss any questions you have with your health care provider. ?Document Revised: 11/20/2018 Document Reviewed: 06/16/2017 ?Elsevier Patient Education ? 2023 [employer] ?Colonoscopy, Adult, Care After ?The following information offers guidance on how to care for yourself after your procedure. Your health care provider may also give you more specific instructions. If you have problems or questions, contact your health care provider. ?What can I expect after the procedure? ?After the procedure, it is common to have: ?A small amount of blood in your stool for 24 hours  after the procedure. ?Some gas. ?Mild cramping or bloating of your abdomen. ?Follow these instructions at home: ?Eating and drinking ? ?Drink enough fluid to keep your urine pale yellow. ?Follow instructions from your health care provider about eating or drinking restrictions. ?Resume your normal diet as told by your health care provider. Avoid heavy or fried foods that are hard to digest. ?Activity ?Rest as told by your health care provider. ?Avoid sitting for a long time without moving. Get up to take short walks every 1-2 hours. This is important to improve blood flow and breathing. Ask for help if you feel weak or unsteady. ?Return to your normal activities as told by your health care provider. Ask your health care provider what activities are safe for you. ?Managing cramping and bloating ? ?Try walking around when you have cramps or feel bloated. ?If directed, apply heat to your abdomen as told by your health care provider. Use the heat source that your health care provider recommends, such as a moist heat pack or a heating pad. ?Place a towel between your skin and the heat source. ?Leave the heat on for 20-30 minutes. ?Remove the heat if your skin turns bright red. This is especially important if you are unable to feel pain, heat, or cold. You have a greater risk of getting burned. ?General instructions ?If you were given a sedative during the procedure, it can affect you for several hours. Do not drive or operate machinery until your health care provider says that it is safe. ?For the first 24 hours after the procedure: ?Do not sign important documents. ?Do not drink alcohol. ?Do your regular daily activities at a slower pace than normal. ?Eat soft foods that are easy to digest. ?Take over-the-counter and prescription medicines only as told by your health care provider. ?Keep all follow-up visits. This is important. ?Contact a health care provider if: ?You have blood in your stool 2-3 days after the  procedure. ?Get help right away if: ?You have more than a small spotting of blood in your stool. ?You have large blood clots in your stool. ?You have swelling of your abdomen. ?You have nausea or vomiting. ?You have a fever. ?You have increasing pain in your abdomen that is not relieved with medicine. ?These symptoms may be an emergency. Get help right away. Call 911. ?Do not wait to see if the symptoms will go away. ?Do not drive yourself to the hospital. ?Summary ?After the procedure, it is common to have a small amount of blood in your stool. You may also have mild cramping and bloating of your abdomen. ?If you were given a sedative during the procedure, it can affect you for several hours. Do not drive or operate machinery until your health care provider says that it is safe. ?Get help right away if you have a lot of blood in your stool, nausea or vomiting, a  fever, or increased pain in your abdomen. ?This information is not intended to replace advice given to you by your health care provider. Make sure you discuss any questions you have with your health care provider. ?Document Revised: 09/06/2020 Document Reviewed: 09/06/2020 ?Elsevier Patient Education ? 2023 [employer] Inc[employer] ?Monitored Anesthesia Care, Care After ?This sheet gives you information about how to care for yourself after your procedure. Your health care provider may also give you more specific instructions. If you have problems or questions, contact your health care provider. ?What can I expect after the procedure? ?After the procedure, it is common to have: ?Tiredness. ?Forgetfulness about what happened after the procedure. ?Impaired judgment for important decisions. ?Nausea or vomiting. ?Some difficulty with balance. ?Follow these instructions at home: ?For the time period you were told by your health care provider: ? ?  ? ?Rest as needed. ?Do not participate in activities where you could fall or become injured. ?Do not drive or use machinery. ?Do  not drink alcohol. ?Do not take sleeping pills or medicines that cause drowsiness. ?Do not make important decisions or sign legal documents. ?Do not take care of children on your own. ?Eating and drinking ?Foll

## 2021-05-28 NOTE — Telephone Encounter (Signed)
Pt said she was in the hospital recently and was prescribed 2 different fluid pills (doesn't know the name) and she is needing a refill and doesn't know if we would refill them or does her PCP need to. She uses [facility]. [phone] ?

## 2021-05-29 ENCOUNTER — Encounter (HOSPITAL_COMMUNITY)
Admission: RE | Admit: 2021-05-29 | Discharge: 2021-05-29 | Disposition: A | Discharge status: Home / Self Care | Payer: Medicaid Other | Source: Ambulatory Visit | Attending: Internal Medicine | Admitting: Internal Medicine

## 2021-05-29 ENCOUNTER — Encounter (HOSPITAL_COMMUNITY): Payer: Self-pay

## 2021-05-29 VITALS — BP 121/77 | HR 72 | Temp 97.6°F | Resp 18 | Ht 66.0 in | Wt 150.0 lb

## 2021-05-29 DIAGNOSIS — Z01812 Encounter for preprocedural laboratory examination: Secondary | ICD-10-CM | POA: Diagnosis not present

## 2021-05-29 DIAGNOSIS — D508 Other iron deficiency anemias: Secondary | ICD-10-CM | POA: Insufficient documentation

## 2021-05-29 DIAGNOSIS — K7031 Alcoholic cirrhosis of liver with ascites: Secondary | ICD-10-CM

## 2021-05-29 DIAGNOSIS — E871 Hypo-osmolality and hyponatremia: Secondary | ICD-10-CM | POA: Insufficient documentation

## 2021-05-29 DIAGNOSIS — D649 Anemia, unspecified: Secondary | ICD-10-CM

## 2021-05-29 DIAGNOSIS — D500 Iron deficiency anemia secondary to blood loss (chronic): Secondary | ICD-10-CM | POA: Diagnosis not present

## 2021-05-29 DIAGNOSIS — E876 Hypokalemia: Secondary | ICD-10-CM | POA: Diagnosis not present

## 2021-05-29 DIAGNOSIS — K769 Liver disease, unspecified: Secondary | ICD-10-CM

## 2021-05-29 HISTORY — DX: Unspecified cirrhosis of liver: K74.60

## 2021-05-29 LAB — CBC WITH DIFFERENTIAL/PLATELET
Abs Immature Granulocytes: 0.02 10*3/uL (ref 0.00–0.07)
Basophils Absolute: 0 10*3/uL (ref 0.0–0.1)
Basophils Relative: 1 %
Eosinophils Absolute: 0.1 10*3/uL (ref 0.0–0.5)
Eosinophils Relative: 2 %
HCT: 30.9 % — ABNORMAL LOW (ref 36.0–46.0)
Hemoglobin: 9.4 g/dL — ABNORMAL LOW (ref 12.0–15.0)
Immature Granulocytes: 1 %
Lymphocytes Relative: 26 %
Lymphs Abs: 1 10*3/uL (ref 0.7–4.0)
MCH: 28.6 pg (ref 26.0–34.0)
MCHC: 30.4 g/dL (ref 30.0–36.0)
MCV: 93.9 fL (ref 80.0–100.0)
Monocytes Absolute: 0.2 10*3/uL (ref 0.1–1.0)
Monocytes Relative: 6 %
Neutro Abs: 2.5 10*3/uL (ref 1.7–7.7)
Neutrophils Relative %: 64 %
Platelets: 121 10*3/uL — ABNORMAL LOW (ref 150–400)
RBC: 3.29 MIL/uL — ABNORMAL LOW (ref 3.87–5.11)
RDW: 21.1 % — ABNORMAL HIGH (ref 11.5–15.5)
WBC: 3.8 10*3/uL — ABNORMAL LOW (ref 4.0–10.5)
nRBC: 0 % (ref 0.0–0.2)

## 2021-05-29 LAB — BASIC METABOLIC PANEL
Anion gap: 6 (ref 5–15)
BUN: 8 mg/dL (ref 6–20)
CO2: 25 mmol/L (ref 22–32)
Calcium: 8.6 mg/dL — ABNORMAL LOW (ref 8.9–10.3)
Chloride: 105 mmol/L (ref 98–111)
Creatinine, Ser: 0.56 mg/dL (ref 0.44–1.00)
GFR, Estimated: >60 mL/min (ref >60)
Glucose, Bld: 123 mg/dL — ABNORMAL HIGH (ref 70–99)
Potassium: 3.4 mmol/L — ABNORMAL LOW (ref 3.5–5.1)
Sodium: 136 mmol/L (ref 135–145)

## 2021-05-29 LAB — PROTIME-INR
INR: 1.5 — ABNORMAL HIGH (ref 0.8–1.2)
Prothrombin Time: 18 seconds — ABNORMAL HIGH (ref 11.4–15.2)

## 2021-05-30 ENCOUNTER — Encounter: Payer: Self-pay | Admitting: Internal Medicine

## 2021-05-31 ENCOUNTER — Telehealth: Payer: Self-pay | Admitting: Gastroenterology

## 2021-05-31 ENCOUNTER — Ambulatory Visit (HOSPITAL_BASED_OUTPATIENT_CLINIC_OR_DEPARTMENT_OTHER): Payer: Medicaid Other | Admitting: Anesthesiology

## 2021-05-31 ENCOUNTER — Encounter (HOSPITAL_COMMUNITY): Payer: Self-pay | Admitting: Internal Medicine

## 2021-05-31 ENCOUNTER — Ambulatory Visit (HOSPITAL_COMMUNITY): Payer: Medicaid Other | Admitting: Anesthesiology

## 2021-05-31 ENCOUNTER — Ambulatory Visit (HOSPITAL_COMMUNITY)
Admission: RE | Admit: 2021-05-31 | Discharge: 2021-05-31 | Disposition: A | Discharge status: Home / Self Care | Payer: Medicaid Other | Attending: Internal Medicine | Admitting: Internal Medicine

## 2021-05-31 ENCOUNTER — Encounter (HOSPITAL_COMMUNITY)
Admission: RE | Disposition: A | Discharge status: Home / Self Care | Payer: Self-pay | Source: Home / Self Care | Attending: Internal Medicine

## 2021-05-31 DIAGNOSIS — K219 Gastro-esophageal reflux disease without esophagitis: Secondary | ICD-10-CM | POA: Insufficient documentation

## 2021-05-31 DIAGNOSIS — D649 Anemia, unspecified: Secondary | ICD-10-CM

## 2021-05-31 DIAGNOSIS — K7030 Alcoholic cirrhosis of liver without ascites: Secondary | ICD-10-CM | POA: Insufficient documentation

## 2021-05-31 DIAGNOSIS — K3189 Other diseases of stomach and duodenum: Secondary | ICD-10-CM | POA: Diagnosis not present

## 2021-05-31 DIAGNOSIS — K644 Residual hemorrhoidal skin tags: Secondary | ICD-10-CM | POA: Insufficient documentation

## 2021-05-31 DIAGNOSIS — I8500 Esophageal varices without bleeding: Secondary | ICD-10-CM

## 2021-05-31 DIAGNOSIS — F419 Anxiety disorder, unspecified: Secondary | ICD-10-CM | POA: Diagnosis not present

## 2021-05-31 DIAGNOSIS — K766 Portal hypertension: Secondary | ICD-10-CM | POA: Diagnosis not present

## 2021-05-31 DIAGNOSIS — K921 Melena: Secondary | ICD-10-CM | POA: Diagnosis not present

## 2021-05-31 DIAGNOSIS — K642 Third degree hemorrhoids: Secondary | ICD-10-CM | POA: Diagnosis not present

## 2021-05-31 DIAGNOSIS — F17210 Nicotine dependence, cigarettes, uncomplicated: Secondary | ICD-10-CM | POA: Insufficient documentation

## 2021-05-31 DIAGNOSIS — I8510 Secondary esophageal varices without bleeding: Secondary | ICD-10-CM | POA: Insufficient documentation

## 2021-05-31 HISTORY — PX: COLONOSCOPY WITH PROPOFOL: SHX5780

## 2021-05-31 HISTORY — PX: ESOPHAGOGASTRODUODENOSCOPY (EGD) WITH PROPOFOL: SHX5813

## 2021-05-31 SURGERY — COLONOSCOPY WITH PROPOFOL
Anesthesia: General

## 2021-05-31 MED ORDER — LACTATED RINGERS IV SOLN: INTRAVENOUS | Status: DC

## 2021-05-31 MED ORDER — STERILE WATER FOR IRRIGATION IR SOLN
Status: DC | PRN
  Administered 2021-05-31: .6 mL

## 2021-05-31 MED ORDER — LIDOCAINE HCL (CARDIAC) PF 100 MG/5ML IV SOSY
PREFILLED_SYRINGE | INTRAVENOUS | Status: DC | PRN
  Administered 2021-05-31: 60 mg via INTRATRACHEAL

## 2021-05-31 MED ORDER — LACTATED RINGERS IV SOLN
INTRAVENOUS | Status: DC | PRN
Start: 2021-05-31 — End: 2021-05-31

## 2021-05-31 MED ORDER — PROPOFOL 500 MG/50ML IV EMUL
INTRAVENOUS | Status: DC | PRN
  Administered 2021-05-31: 300 ug/kg/min via INTRAVENOUS

## 2021-05-31 MED ORDER — PROPOFOL 10 MG/ML IV BOLUS
INTRAVENOUS | Status: DC | PRN
  Administered 2021-05-31 (×3): 50 mg via INTRAVENOUS
  Administered 2021-05-31: 200 mg via INTRAVENOUS
  Administered 2021-05-31: 100 mg via INTRAVENOUS

## 2021-05-31 NOTE — Telephone Encounter (Addendum)
Received staff message from Dr. Gala Romney following patient's EGD and colonoscopy today.  Stated she has small varices but has very large hemorrhoids.  Stated nonspecific beta-blockade may take the pressure off of these veins.  She was started on nadolol. He recommended 4 to 6-week follow-up in the office.  She is currently scheduled to see me on 7/27. We will try to get this moved up.  ? ?Dr. Gala Romney also noted that her urinary copper content was very low.  Stated she was seeing the eye doctor later this month and will need to rule out [employer] rings.  ? ?He also recommended MRI in 6 months along with AFP.  We can discuss arranging this at her follow-up visit. ? ?Courtney:  ?Please find out what eye doctor patient is going to see. We need to send a request to their office to evaluate for [employer] rings due to liver disease.  ? ?Stacey:  ?Please move patient's follow-up appointment to 4-6 weeks from now.  ? ? ? ?

## 2021-05-31 NOTE — Discharge Instructions (Addendum)
?Colonoscopy ?Discharge Instructions ? ?Read the instructions outlined below and refer to this sheet in the next few weeks. These discharge instructions provide you with general information on caring for yourself after you leave the hospital. Your doctor may also give you specific instructions. While your treatment has been planned according to the most current medical practices available, unavoidable complications occasionally occur. If you have any problems or questions after discharge, call Dr. Gala Romney at [phone]. ?ACTIVITY ?You may resume your regular activity, but move at a slower pace for the next 24 hours.  ?Take frequent rest periods for the next 24 hours.  ?Walking will help get rid of the air and reduce the bloated feeling in your belly (abdomen).  ?No driving for 24 hours (because of the medicine (anesthesia) used during the test).   ?Do not sign any important legal documents or operate any machinery for 24 hours (because of the anesthesia used during the test).  ?NUTRITION ?Drink plenty of fluids.  ?You may resume your normal diet as instructed by your doctor.  ?Begin with a light meal and progress to your normal diet. Heavy or fried foods are harder to digest and may make you feel sick to your stomach (nauseated).  ?Avoid alcoholic beverages for 24 hours or as instructed.  ?MEDICATIONS ?You may resume your normal medications unless your doctor tells you otherwise.  ?WHAT YOU CAN EXPECT TODAY ?Some feelings of bloating in the abdomen.  ?Passage of more gas than usual.  ?Spotting of blood in your stool or on the toilet paper.  ?IF YOU HAD POLYPS REMOVED DURING THE COLONOSCOPY: ?No aspirin products for 7 days or as instructed.  ?No alcohol for 7 days or as instructed.  ?Eat a soft diet for the next 24 hours.  ?FINDING OUT THE RESULTS OF YOUR TEST ?Not all test results are available during your visit. If your test results are not back during the visit, make an appointment with your caregiver to find out the  results. Do not assume everything is normal if you have not heard from your caregiver or the medical facility. It is important for you to follow up on all of your test results.  ?SEEK IMMEDIATE MEDICAL ATTENTION IF: ?You have more than a spotting of blood in your stool.  ?Your belly is swollen (abdominal distention).  ?You are nauseated or vomiting.  ?You have a temperature over 101.  ?You have abdominal pain or discomfort that is severe or gets worse throughout the day.   ?EGD ?Discharge instructions ?Please read the instructions outlined below and refer to this sheet in the next few weeks. These discharge instructions provide you with general information on caring for yourself after you leave the hospital. Your doctor may also give you specific instructions. While your treatment has been planned according to the most current medical practices available, unavoidable complications occasionally occur. If you have any problems or questions after discharge, please call your doctor. ?ACTIVITY ?You may resume your regular activity but move at a slower pace for the next 24 hours.  ?Take frequent rest periods for the next 24 hours.  ?Walking will help expel (get rid of) the air and reduce the bloated feeling in your abdomen.  ?No driving for 24 hours (because of the anesthesia (medicine) used during the test).  ?You may shower.  ?Do not sign any important legal documents or operate any machinery for 24 hours (because of the anesthesia used during the test).  ?NUTRITION ?Drink plenty of fluids.  ?You may  resume your normal diet.  ?Begin with a light meal and progress to your normal diet.  ?Avoid alcoholic beverages for 24 hours or as instructed by your caregiver.  ?MEDICATIONS ?You may resume your normal medications unless your caregiver tells you otherwise.  ?WHAT YOU CAN EXPECT TODAY ?You may experience abdominal discomfort such as a feeling of fullness or ?gas? pains.  ?FOLLOW-UP ?Your doctor will discuss the results of  your test with you.  ?SEEK IMMEDIATE MEDICAL ATTENTION IF ANY OF THE FOLLOWING OCCUR: ?Excessive nausea (feeling sick to your stomach) and/or vomiting.  ?Severe abdominal pain and distention (swelling).  ?Trouble swallowing.  ?Temperature over 101? F (37.8? C).  ?Rectal bleeding or vomiting of blood.   ? ? ?  You have large hemorrhoids but no polyps ? 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 ? ?We will start you on nadolol 40 mg daily -  to decrease the chances of bleeding from either hemorrhoids or varicose veins in the esophagus ? ? office  visit with [place] in 6 to [place] ? ? repeat MRI of the liver in 6 months ? ? at patient request, I called Marquette Old at [phone] findings and recommendations ? ? repeat colonoscopy in 10 years for screening purposes ?

## 2021-05-31 NOTE — Anesthesia Postprocedure Evaluation (Signed)
Anesthesia Post Note ? ?Patient: Erica Banks ? ?Procedure(s) Performed: COLONOSCOPY WITH PROPOFOL ?ESOPHAGOGASTRODUODENOSCOPY (EGD) WITH PROPOFOL ? ?Patient location during evaluation: Phase II ?Anesthesia Type: General ?Level of consciousness: awake ?Pain management: pain level controlled ?Vital Signs Assessment: post-procedure vital signs reviewed and stable ?Respiratory status: spontaneous breathing and respiratory function stable ?Cardiovascular status: blood pressure returned to baseline and stable ?Postop Assessment: no headache and no apparent nausea or vomiting ?Anesthetic complications: no ?Comments: Late entry ? ? ?No notable events documented. ? ? ?Last Vitals:  ?Vitals:  ? 05/31/21 1140 05/31/21 1334  ?BP: 115/72 98/67  ?Pulse: 79 95  ?Resp: 14 14  ?Temp: 36.9 ?C 36.7 ?C  ?SpO2: 100% 100%  ?  ?Last Pain:  ?Vitals:  ? 05/31/21 1334  ?TempSrc: Oral  ?PainSc: 0-No pain  ? ? ?  ?  ?  ?  ?  ?  ? ?Windell [place] ? ? ? ? ?

## 2021-05-31 NOTE — Op Note (Signed)
[facility] ?Patient Name: Erica Banks ?Procedure Date: 05/31/2021 12:42 PM ?MRN: [id number] ?Date of Birth: August 08, 1980 ?Attending MD: Gennette Pac , MD ?CSN: [id number] ?Age: 41 ?Admit Type: Outpatient ?Procedure:                Upper GI endoscopy ?Indications:              Hematochezia ?Providers:                Gennette Pac, MD, Jannett Celestine, RN, Elson Areas  ?                          Onalee Hua, Technician ?Referring MD:              ?Medicines:                Propofol per Anesthesia ?Complications:            No immediate complications. ?Estimated Blood Loss:     Estimated blood loss: none. ?Procedure:                Pre-Anesthesia Assessment: ?                          - Prior to the procedure, a History and Physical  ?                          was performed, and patient medications and  ?                          allergies were reviewed. The patient's tolerance of  ?                          previous anesthesia was also reviewed. The risks  ?                          and benefits of the procedure and the sedation  ?                          options and risks were discussed with the patient.  ?                          All questions were answered, and informed consent  ?                          was obtained. Prior Anticoagulants: The patient has  ?                          taken no previous anticoagulant or antiplatelet  ?                          agents. ASA Grade Assessment: III - A patient with  ?                          severe systemic disease. After reviewing the risks  ?  and benefits, the patient was deemed in  ?                          satisfactory condition to undergo the procedure. ?                          After obtaining informed consent, the endoscope was  ?                          passed under direct vision. Throughout the  ?                          procedure, the patient's blood pressure, pulse, and  ?                          oxygen saturations were  monitored continuously. The  ?                          GIF-H190 ([id number]) scope was introduced through the  ?                          mouth, and advanced to the second part of duodenum.  ?                          The upper GI endoscopy was accomplished without  ?                          difficulty. The patient tolerated the procedure  ?                          well. ?Scope In: 1:00:04 PM ?Scope Out: 1:04:22 PM ?Total Procedure Duration: 0 hours 4 minutes 18 seconds  ?Findings: ?     3 columns grade 1-2 esophageal varices -distal 5 cm of tubular  ?     esophagus. No bleeding stigmata. Marked changes of portal hypertensive  ?     gastropathy diffusely. No ulcer or infiltrating process. No gastric  ?     varices pylorus patent and easily traversed examination bulb and second  ?     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 ?Moderate Sedation: ?     Moderate (conscious) sedation was personally administered by an  ?     anesthesia professional. The following parameters were monitored: oxygen  ?     saturation, heart rate, blood pressure, respiratory rate, EKG, adequacy  ?     of pulmonary ventilation, and response to care. ?Recommendation:           - Patient has a contact number available for  ?  emergencies. The signs and symptoms of potential  ?                          delayed complications were discussed with the  ?                          patient. Return to normal activities tomorrow.  ?                          Written discharge instructions were provided to the  ?                          patient. ?                          - Advance diet as tolerated. Office visit in 6 to 8  ?                          weeks. See colonoscopy report. ?Procedure Code(s):        --- Professional  --- ?                          [phone], Esophagogastroduodenoscopy, flexible,  ?                          transoral; diagnostic, including collection of  ?                          specimen(s) by brushing or washing, when performed  ?                          (separate procedure) ?Diagnosis Code(s):        --- Professional --- ?                          K92.1, Melena (includes Hematochezia) ?CPT copyright 2019 [employer] Medical Association. All rights reserved. ?The codes documented in this report are preliminary and upon coder review may  ?be revised to meet current compliance requirements. ?Gerrit Friends. Pablo Mathurin, MD ?Gennette Pac, MD ?05/31/2021 1:07:57 PM ?This report has been signed electronically. ?Number of Addenda: 0 ?

## 2021-05-31 NOTE — Op Note (Signed)
[facility] ?Patient Name: Erica Banks ?Procedure Date: 05/31/2021 12:41 PM ?MRN: [id number] ?Date of Birth: January 07, 1981 ?Attending MD: Gennette Pac , MD ?CSN: [id number] ?Age: 41 ?Admit Type: Outpatient ?Procedure:                Colonoscopy ?Indications:              Hematochezia ?Providers:                Gennette Pac, MD, Jannett Celestine, RN, Elson Areas  ?                          Onalee Hua, Technician ?Referring MD:              ?Medicines:                Propofol per Anesthesia ?Complications:            No immediate complications. ?Estimated Blood Loss:     Estimated blood loss: none. ?Procedure:                Pre-Anesthesia Assessment: ?                          - Prior to the procedure, a History and Physical  ?                          was performed, and patient medications and  ?                          allergies were reviewed. The patient's tolerance of  ?                          previous anesthesia was also reviewed. The risks  ?                          and benefits of the procedure and the sedation  ?                          options and risks were discussed with the patient.  ?                          All questions were answered, and informed consent  ?                          was obtained. Prior Anticoagulants: The patient has  ?                          taken no previous anticoagulant or antiplatelet  ?                          agents. ASA Grade Assessment: III - A patient with  ?                          severe systemic disease. After reviewing the risks  ?                          and  benefits, the patient was deemed in  ?                          satisfactory condition to undergo the procedure. ?                          After obtaining informed consent, the colonoscope  ?                          was passed under direct vision. Throughout the  ?                          procedure, the patient's blood pressure, pulse, and  ?                          oxygen saturations were  monitored continuously. The  ?                          [phone]) scope was introduced through the  ?                          anus and advanced to the the cecum, identified by  ?                          appendiceal orifice and ileocecal valve. The  ?                          colonoscopy was performed without difficulty. The  ?                          patient tolerated the procedure well. The quality  ?                          of the bowel preparation was adequate. ?Scope In: 1:12:40 PM ?Scope Out: 1:25:25 PM ?Scope Withdrawal Time: 0 hours 6 minutes 16 seconds  ?Total Procedure Duration: 0 hours 12 minutes 45 seconds  ?Findings: ?     The perianal and digital rectal examinations were normal. ?     Non-bleeding external and internal hemorrhoids were found during  ?     retroflexion. The hemorrhoids were severe, large and Grade III (internal  ?     hemorrhoids that prolapse but require manual reduction). Diffusely  ?     congested colonic mucosa. Otherwise, ?     the colon (entire examined portion) appeared normal. ?     The exam was otherwise without abnormality on direct and retroflexion  ?     views. ?Impression:               - Non-bleeding external and internal hemorrhoids  ?                          -very large. ?                          -Portal colopathy. ?                          -  The examination was otherwise normal on direct  ?                          and retroflexion views. ?                          - No specimens collected. ?Moderate Sedation: ?     Moderate (conscious) sedation was personally administered by an  ?     anesthesia professional. The following parameters were monitored: oxygen  ?     saturation, heart rate, blood pressure, respiratory rate, EKG, adequacy  ?     of pulmonary ventilation, and response to care. ?Recommendation:           - Patient has a contact number available for  ?                          emergencies. The signs and symptoms of potential  ?                           delayed complications were discussed with the  ?                          patient. Return to normal activities tomorrow.  ?                          Written discharge instructions were provided to the  ?                          patient. ?                          - Resume previous diet. ?                          - Continue present medications. ?                          - Repeat colonoscopy in 10 years for screening  ?                          purposes. Begin nadolol 40 mg daily. Office visit  ?                          with [place] in 6 weeks. See EGD report ?Procedure Code(s):        --- Professional --- ?                          [phone], Colonoscopy, flexible; diagnostic, including  ?                          collection of specimen(s) by brushing or washing,  ?                          when performed (separate procedure) ?Diagnosis Code(s):        --- Professional --- ?  K64.2, Third degree hemorrhoids ?                          K92.1, Melena (includes Hematochezia) ?CPT copyright 2019 American Medical Association. All rights reserved. ?The codes documented in this report are preliminary and upon coder review may  ?be revised to meet current compliance requirements. ?Gerrit Friends. Leyli Kevorkian, MD ?Gennette Pac, MD ?05/31/2021 1:43:56 PM ?This report has been signed electronically. ?Number of Addenda: 0 ?

## 2021-05-31 NOTE — H&P (Signed)
?@LOGO @ ? ? ?Primary Care Physician:  , MD ?Primary Gastroenterologist:  Dr. Toma Deiters ? ?Pre-Procedure History & Physical: ?HPI:  Erica Banks is a 41 y.o. female here for  for further evaluation of recent multiunit GI bleed via EGD and colonoscopy. ?  74-month sobriety.  History of known grade 1 esophageal varices seen on EGD earlier this year.  Also known significant hemorrhoids. ? ?Past Medical History:  ?Diagnosis Date  ? Anemia   ? Cirrhosis (HCC)   ? DVT (deep venous thrombosis) (HCC)   ? Low iron   ? Neuropathy   ? Neuropathy   ? Panic attacks   ? Pulmonary embolus (HCC) 2019  ? ? ?Past Surgical History:  ?Procedure Laterality Date  ? ABDOMINAL HYSTERECTOMY    ? BIOPSY N/A 06/24/2013  ? Procedure: BIOPSY TERMINAL ILEUM;  Surgeon: 06/26/2013, MD;  [facility];  Service: Endoscopy;  Laterality: N/A;  ? CHOLECYSTECTOMY    ? COLONOSCOPY N/A 06/24/2013  ? 06/26/2013 hemorrhoids/otherwise normal   ? ESOPHAGOGASTRODUODENOSCOPY (EGD) WITH PROPOFOL N/A 03/29/2021  ? Procedure: ESOPHAGOGASTRODUODENOSCOPY (EGD) WITH PROPOFOL;  Surgeon: 05/29/2021, DO;  [facility];  Service: Endoscopy;  Laterality: N/A;  ? nasal bone surgery    ? NASAL SINUS SURGERY    ? SINUS IRRIGATION    ? TONSILLECTOMY    ? TUBAL LIGATION    ? ? ?Prior to Admission medications   ?Medication Sig Start Date End Date Taking? Authorizing Provider  ?furosemide (LASIX) 40 MG tablet Take 1 tablet (40 mg total) by mouth daily. 03/31/21  Yes 05/31/21, MD  ?gabapentin (NEURONTIN) 300 MG capsule Take 300 mg by mouth 3 (three) times daily as needed (nerve pain). 02/25/21  Yes [provider]  ?pantoprazole (PROTONIX) 40 MG tablet Take 1 tablet (40 mg total) by mouth 2 (two) times daily before a meal. 03/30/21  Yes 05/30/21, MD  ?spironolactone (ALDACTONE) 100 MG tablet Take 1 tablet (100 mg total) by mouth daily. 03/31/21  Yes 05/31/21, MD  ?traMADol (ULTRAM) 50 MG tablet Take 1 tablet  (50 mg total) by mouth every 6 (six) hours as needed. ?Patient not taking: Reported on 04/17/2021 03/30/21 03/30/22  05/30/22, MD  ? ? ?Allergies as of 05/02/2021 - Review Complete 04/17/2021  ?Allergen Reaction Noted  ? Demerol Anaphylaxis 10/12/2010  ? ? ?Family History  ?Problem Relation Age of Onset  ? Diabetes Mother   ? Hypertension Mother   ? COPD Mother   ? Cirrhosis Father   ?     ETOH  ? Diabetes Sister   ? Hypertension Sister   ? Crohn's disease Maternal Grandmother   ? Cancer Other   ? Colon cancer Neg Hx   ? Autoimmune disease Neg Hx   ? ? ?Social History  ? ?Socioeconomic History  ? Marital status: Married  ?  Spouse name: Not on file  ? Number of children: Not on file  ? Years of education: Not on file  ? Highest education level: Not on file  ?Occupational History  ? Occupation: Unemployed  ?  Comment: wants to go to nursing school in fall 2015  ?Tobacco Use  ? Smoking status: Every Day  ?  Packs/day: 0.50  ?  Years: 12.00  ?  Pack years: 6.00  ?  Types: Cigarettes  ? Smokeless tobacco: Never  ?Vaping Use  ? Vaping Use: Never used  ?Substance and Sexual Activity  ? Alcohol use: Not Currently  ?  Alcohol/week: 4.0 - 5.0 standard drinks  ?  Types: 4 - 5 Cans of beer per week  ?  Comment: occ  ? Drug use: Yes  ?  Types: Marijuana  ?  Comment: occasional  ? Sexual activity: Yes  ?  Birth control/protection: Surgical  ?Other Topics Concern  ? Not on file  ?Social History Narrative  ? Not on file  ? ?Social Determinants of Health  ? ?Financial Resource Strain: Not on file  ?Food Insecurity: Not on file  ?Transportation Needs: Not on file  ?Physical Activity: Not on file  ?Stress: Not on file  ?Social Connections: Not on file  ?Intimate Partner Violence: Not on file  ? ? ?Review of Systems: ?See HPI, otherwise negative ROS ? ?Physical Exam: ?BP 115/72   Pulse 79   Temp 98.5 ?F (36.9 ?C) (Oral)   Resp 14   Ht 5\' 6"  (1.676 m)   Wt 68 kg   LMP  (LMP Unknown)   SpO2 100%   BMI 24.21 kg/m?  ?General:    Alert,  Well-developed, well-nourished, pleasant and cooperative in NAD ?Skin:  Intact without significant lesions or rashes. ?Neck:  Supple; no masses or thyromegaly. No significant cervical adenopathy. ?Lungs:  Clear throughout to auscultation.   No wheezes, crackles, or rhonchi. No acute distress. ?Heart:  Regular rate and rhythm; no murmurs, clicks, rubs,  or gallops. ?Abdomen: Non-distended, normal bowel sounds.  Soft and nontender without appreciable mass or hepatosplenomegaly.  ?Pulses:  Normal pulses noted. ?Extremities:  Without clubbing or edema. ? ?Impression/Plan:    41 year old EtOH cirrhosis here for further evaluation of multiunit GI bleed.  Known hemorrhoids known hematochezia.  No alcohol in 3 months.  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  She is on to see the ophthalmologist later this month to check on KF rings. ?  No need for genetic markers for liver biopsy at this time. ? ?  Recommendations: Have offered the patient an EGD and a colonoscopy today. ?The risks, benefits, limitations, imponderables and alternatives regarding both EGD and colonoscopy have been reviewed with the patient. Questions have been answered. All parties agreeable.   ? ? ? ? ?Notice: This dictation was prepared with Dragon dictation along with smaller phrase technology. Any transcriptional errors that result from this process are unintentional and may not be corrected upon review.  ?

## 2021-05-31 NOTE — Transfer of Care (Signed)
Immediate Anesthesia Transfer of Care Note ? ?Patient: Erica Banks ? ?Procedure(s) Performed: COLONOSCOPY WITH PROPOFOL ?ESOPHAGOGASTRODUODENOSCOPY (EGD) WITH PROPOFOL ? ?Patient Location: Short Stay ? ?Anesthesia Type:General ? ?Level of Consciousness: awake, alert  and oriented ? ?Airway & Oxygen Therapy: Patient Spontanous Breathing ? ?Post-op Assessment: Report given to RN and Post -op Vital signs reviewed and stable ? ?Post vital signs: Reviewed and stable ? ?Last Vitals:  ?Vitals Value Taken Time  ?BP    ?Temp    ?Pulse    ?Resp    ?SpO2    ? ? ?Last Pain:  ?Vitals:  ? 05/31/21 1334  ?TempSrc: Oral  ?PainSc: 0-No pain  ?   ? ?Patients Stated Pain Goal: 7 (05/31/21 1140) ? ?Complications: No notable events documented. ?

## 2021-05-31 NOTE — Anesthesia Preprocedure Evaluation (Signed)
Anesthesia Evaluation  ?Patient identified by MRN, date of birth, ID band ?Patient awake ? ? ? ?Reviewed: ?Allergy & Precautions, H&P , NPO status , Patient's Chart, lab work & pertinent test results, reviewed documented beta blocker date and time  ? ?Airway ?Mallampati: II ? ?TM Distance: >3 FB ?Neck ROM: full ? ? ? Dental ?no notable dental hx. ? ?  ?Pulmonary ?neg pulmonary ROS, Current Smoker,  ?  ?Pulmonary exam normal ?breath sounds clear to auscultation ? ? ? ? ? ? Cardiovascular ?Exercise Tolerance: Good ?negative cardio ROS ? ? ?Rhythm:regular Rate:Normal ? ? ?  ?Neuro/Psych ?PSYCHIATRIC DISORDERS Anxiety negative neurological ROS ?   ? GI/Hepatic ?GERD  Medicated,(+) Cirrhosis  ? ascites ? substance abuse ? alcohol use,   ?Endo/Other  ?negative endocrine ROS ? Renal/GU ?negative Renal ROS  ?negative genitourinary ?  ?Musculoskeletal ? ? Abdominal ?  ?Peds ? Hematology ? ?(+) Blood dyscrasia, anemia ,   ?Anesthesia Other Findings ? ? Reproductive/Obstetrics ?negative OB ROS ? ?  ? ? ? ? ? ? ? ? ? ? ? ? ? ?  ?  ? ? ? ? ? ? ? ? ?Anesthesia Physical ?Anesthesia Plan ? ?ASA: 3 ? ?Anesthesia Plan: General  ? ?Post-op Pain Management:   ? ?Induction:  ? ?PONV Risk Score and Plan: Propofol infusion ? ?Airway Management Planned:  ? ?Additional Equipment:  ? ?Intra-op Plan:  ? ?Post-operative Plan:  ? ?Informed Consent: I have reviewed the patients History and Physical, chart, labs and discussed the procedure including the risks, benefits and alternatives for the proposed anesthesia with the patient or authorized representative who has indicated his/her understanding and acceptance.  ? ? ? ?Dental Advisory Given ? ?Plan Discussed with: CRNA ? ?Anesthesia Plan Comments:   ? ? ? ? ? ? ?Anesthesia Quick Evaluation ? ?

## 2021-06-04 NOTE — Telephone Encounter (Signed)
Let's see if she can come in on June 2nd. I have a couple spots open there.  ?

## 2021-06-04 NOTE — Telephone Encounter (Signed)
Noted. Please contact their office to let them know we are needing evaluation for Erica Banks due to history of liver disease and low ceruloplasmin.  ? ?We should send written request/referral as well.  ?

## 2021-06-04 NOTE — Telephone Encounter (Signed)
Spoke to pt, she informed me that she has an appointment at [facility] in [place] at the end of the month.  ?

## 2021-06-05 NOTE — Telephone Encounter (Signed)
Fax sent to [facility] at [phone] for referral. Confirmed already has scheduled appt. ?

## 2021-06-06 NOTE — Telephone Encounter (Signed)
Noted. Informed them of needs.  ?

## 2021-06-06 NOTE — Telephone Encounter (Signed)
Noted  

## 2021-06-07 ENCOUNTER — Encounter (HOSPITAL_COMMUNITY): Payer: Self-pay | Admitting: Internal Medicine

## 2021-06-28 NOTE — Progress Notes (Deleted)
Referring Provider: Neale Burly, MD Primary Care Physician:  Neale Burly, MD Primary GI Physician: Dr. Gala Romney  No chief complaint on file.   HPI:   Erica Banks is a 41 y.o. female  with history of alcohol abuse, chronic anemia receiving iron infusions per hematology, anxiety, admitted in February 2023 with newly diagnosed decompensated cirrhosis with tense ascites and coagulopathy and acute on chronic anemia.   For anemia, she received 3 units PRBCs.  EGD with grade 1 esophageal varices, portal hypertensive gastropathy with friable tissue with spontaneous ooze, normal duodenum.  Repeat EGD and colonoscopy in May with grade 1-2 esophageal varices without bleeding stigmata, advanced appearing portal hypertensive gastropathy. Colonoscopy with very large nonbleeding internal and external hemorrhoids, portal colopathy.  Recommended repeat exam in 10 years.  She was to start nadolol 40 mg daily.  She has established with hematology, received 2 Feraheme infusions in April. Most recent hemoglobin improved to 10.0 on 5/22.   For cirrhosis, she required paracentesis x2 during admission and was started on diuretics. Culture and white count negative for SBP.  Gram stain positive.  Treated with antibiotics empirically. Extensive serologic evaluation of cirrhosis with  AIH serologies negative, acute hepatitis panel negative without immunity to hep A or hep B, ceruloplasmin low at 11.5.  Repeat ceruloplasmin in March was 20. 24 hour urine copper <5 making wilson's disease less likely; however, she was to have visit with optometrist for evaluation of [place] rings.   She was found to have liver lesion on CT in February. Follow-up MRI in April with LI-RADS category 3 hepatic lesion (intermediate probability of malignancy) with recommendations for follow-up MRI in 3-6 months with and without contrast. Dr. Gala Romney recommended 6 month repeat. AFP wnl.   Today:   Cirrhosis: Labs: Up to date in  May.  MELD Na 14, improved from 20 during prior admission.  MRI due in October. On recall.  Hep A/B vaccination:  EGD: Up to date.  Variceal prophylaxis: Nadolol? Ascites/peripheral edema:  Diuretics: On Lasix 40 mg daily and spironolactone 100 mg daily. Encephalopathy:   IDA:      Past Medical History:  Diagnosis Date   Anemia    Cirrhosis ([place])    DVT (deep venous thrombosis) (HCC)    Low iron    Neuropathy    Neuropathy    Panic attacks    Pulmonary embolus ([place]) 2019    Past Surgical History:  Procedure Laterality Date   ABDOMINAL HYSTERECTOMY     BIOPSY N/A 06/24/2013   Procedure: BIOPSY TERMINAL ILEUM;  Surgeon: Daneil Dolin, MD;  [facility];  Service: Endoscopy;  Laterality: N/A;   CHOLECYSTECTOMY     COLONOSCOPY N/A 06/24/2013   [id number] hemorrhoids/otherwise normal    COLONOSCOPY WITH PROPOFOL N/A 05/31/2021   Procedure: COLONOSCOPY WITH PROPOFOL;  Surgeon: Daneil Dolin, MD;  [facility];  Service: Endoscopy;  Laterality: N/A;  12:30pm, asa 3   ESOPHAGOGASTRODUODENOSCOPY (EGD) WITH PROPOFOL N/A 03/29/2021   Procedure: ESOPHAGOGASTRODUODENOSCOPY (EGD) WITH PROPOFOL;  Surgeon: Eloise Harman, DO;  [facility];  Service: Endoscopy;  Laterality: N/A;   ESOPHAGOGASTRODUODENOSCOPY (EGD) WITH PROPOFOL N/A 05/31/2021   Procedure: ESOPHAGOGASTRODUODENOSCOPY (EGD) WITH PROPOFOL;  Surgeon: Daneil Dolin, MD;  [facility];  Service: Endoscopy;  Laterality: N/A;   nasal bone surgery     NASAL SINUS SURGERY     SINUS IRRIGATION     TONSILLECTOMY  TUBAL LIGATION      Current Outpatient Medications  Medication Sig Dispense Refill   furosemide (LASIX) 40 MG tablet Take 1 tablet (40 mg total) by mouth daily. 30 tablet 1   gabapentin (NEURONTIN) 300 MG capsule Take 300 mg by mouth 3 (three) times daily as needed (nerve pain).     pantoprazole (PROTONIX) 40 MG tablet Take 1 tablet (40 mg total) by mouth 2 (two) times  daily before a meal. 60 tablet 3   spironolactone (ALDACTONE) 100 MG tablet Take 1 tablet (100 mg total) by mouth daily. 30 tablet 1   traMADol (ULTRAM) 50 MG tablet Take 1 tablet (50 mg total) by mouth every 6 (six) hours as needed. (Patient not taking: Reported on 04/17/2021) 10 tablet 0   No current facility-administered medications for this visit.    Allergies as of 06/29/2021 - Review Complete 05/31/2021  Allergen Reaction Noted   Demerol Anaphylaxis 10/12/2010    Family History  Problem Relation Age of Onset   Diabetes Mother    Hypertension Mother    COPD Mother    Cirrhosis Father        ETOH   Diabetes Sister    Hypertension Sister    Crohn's disease Maternal Grandmother    Cancer Other    Colon cancer Neg Hx    Autoimmune disease Neg Hx     Social History   Socioeconomic History   Marital status: Married    Spouse name: Not on file   Number of children: Not on file   Years of education: Not on file   Highest education level: Not on file  Occupational History   Occupation: Unemployed    Comment: wants to go to nursing school in fall 2015  Tobacco Use   Smoking status: Every Day    Packs/day: 0.50    Years: 12.00    Pack years: 6.00    Types: Cigarettes   Smokeless tobacco: Never  Vaping Use   Vaping Use: Never used  Substance and Sexual Activity   Alcohol use: Not Currently    Alcohol/week: 4.0 - 5.0 standard drinks    Types: 4 - 5 Cans of beer per week    Comment: occ   Drug use: Yes    Types: Marijuana    Comment: occasional   Sexual activity: Yes    Birth control/protection: Surgical  Other Topics Concern   Not on file  Social History Narrative   Not on file   Social Determinants of Health   Financial Resource Strain: Not on file  Food Insecurity: Not on file  Transportation Needs: Not on file  Physical Activity: Not on file  Stress: Not on file  Social Connections: Not on file    Review of Systems: Gen: Denies fever, chills,  *** CV: Denies chest pain, palpitations. Resp: Denies dyspnea, cough.  GI: See HPI Heme: See HPI  Physical Exam: LMP  (LMP Unknown)  General:   Alert and oriented. No distress noted. Pleasant and cooperative.  Head:  Normocephalic and atraumatic. Eyes:  Conjuctiva clear without scleral icterus. Heart:  S1, S2 present without murmurs appreciated. Lungs:  Clear to auscultation bilaterally. No wheezes, rales, or rhonchi. No distress.  Abdomen:  +BS, soft, non-tender and non-distended. No rebound or guarding. No HSM or masses noted. Msk:  Symmetrical without gross deformities. Normal posture. Extremities:  Without edema. Neurologic:  Alert and  oriented x4 Psych:  Normal mood and affect.    Assessment:  Plan:  ***   Aliene Altes, PA-C [facility] 06/29/2021

## 2021-06-29 ENCOUNTER — Ambulatory Visit: Payer: Medicaid Other | Admitting: Gastroenterology

## 2021-08-21 NOTE — Progress Notes (Unsigned)
Referring Provider: Neale Burly, MD Primary Care Physician:  Neale Burly, MD Primary GI Physician: Dr. Gala Romney  No chief complaint on file.   HPI:   Erica Banks is a 41 y.o. female with history of alcohol abuse, chronic anemia receiving iron infusions per hematology, anxiety, admitted in February 2023 with newly diagnosed decompensated cirrhosis with tense ascites and coagulopathy and acute on chronic anemia.    For anemia, she received 3 units PRBCs.  EGD with grade 1 esophageal varices, portal hypertensive gastropathy with friable tissue with spontaneous ooze, normal duodenum.  Repeat EGD and colonoscopy in May with grade 1-2 esophageal varices without bleeding stigmata, advanced appearing portal hypertensive gastropathy. Colonoscopy with very large nonbleeding internal and external hemorrhoids, portal colopathy.  Recommended repeat exam in 10 years.  She was to start nadolol 40 mg daily.  She has established with hematology, received 2 Feraheme infusions in April and 2 infusions in June. Most recent hemoglobin improved to 10.0 on 5/22.    For cirrhosis, she required paracentesis x2 during admission and was started on diuretics. Culture and white count negative for SBP.  Gram stain positive.  Treated with antibiotics empirically. Extensive serologic evaluation of cirrhosis with  AIH serologies negative, acute hepatitis panel negative without immunity to hep A or hep B, ceruloplasmin low at 11.5.  Repeat ceruloplasmin in March was 20. 24 hour urine copper <5 making wilson's disease less likely; however, she was to have visit with optometrist for evaluation of [employer] rings.    She was found to have liver lesion on CT in February. Follow-up MRI in April with LI-RADS category 3 hepatic lesion (intermediate probability of malignancy) with recommendations for follow-up MRI in 3-6 months with and without contrast. Dr. Gala Romney recommended 6 month repeat. AFP wnl.    Today:     Cirrhosis: Labs: Up to date in May.  MELD Na 14, improved from 20 during prior admission.  MRI due in October. On recall.  Hep A/B vaccination:  EGD: Up to date.  Variceal prophylaxis: Nadolol? Ascites/peripheral edema:  Diuretics: On Lasix 40 mg daily and spironolactone 100 mg daily. Encephalopathy:    IDA:  Most recent labs on file from May 22 with hemoglobin 10.0, ferritin 20, iron 27 (L), iron saturation 12%.  Past Medical History:  Diagnosis Date   Anemia    Cirrhosis ([place])    DVT (deep venous thrombosis) (HCC)    Low iron    Neuropathy    Neuropathy    Panic attacks    Pulmonary embolus ([place]) 2019    Past Surgical History:  Procedure Laterality Date   ABDOMINAL HYSTERECTOMY     BIOPSY N/A 06/24/2013   Procedure: BIOPSY TERMINAL ILEUM;  Surgeon: Daneil Dolin, MD;  [facility];  Service: Endoscopy;  Laterality: N/A;   CHOLECYSTECTOMY     COLONOSCOPY N/A 06/24/2013   [id number] hemorrhoids/otherwise normal    COLONOSCOPY WITH PROPOFOL N/A 05/31/2021   Procedure: COLONOSCOPY WITH PROPOFOL;  Surgeon: Daneil Dolin, MD;  [facility];  Service: Endoscopy;  Laterality: N/A;  12:30pm, asa 3   ESOPHAGOGASTRODUODENOSCOPY (EGD) WITH PROPOFOL N/A 03/29/2021   Procedure: ESOPHAGOGASTRODUODENOSCOPY (EGD) WITH PROPOFOL;  Surgeon: Eloise Harman, DO;  [facility];  Service: Endoscopy;  Laterality: N/A;   ESOPHAGOGASTRODUODENOSCOPY (EGD) WITH PROPOFOL N/A 05/31/2021   Procedure: ESOPHAGOGASTRODUODENOSCOPY (EGD) WITH PROPOFOL;  Surgeon: Daneil Dolin, MD;  [facility];  Service: Endoscopy;  Laterality: N/A;  nasal bone surgery     NASAL SINUS SURGERY     SINUS IRRIGATION     TONSILLECTOMY     TUBAL LIGATION      Current Outpatient Medications  Medication Sig Dispense Refill   furosemide (LASIX) 40 MG tablet Take 1 tablet (40 mg total) by mouth daily. 30 tablet 1   gabapentin (NEURONTIN) 300 MG capsule Take 300 mg by mouth 3  (three) times daily as needed (nerve pain).     pantoprazole (PROTONIX) 40 MG tablet Take 1 tablet (40 mg total) by mouth 2 (two) times daily before a meal. 60 tablet 3   spironolactone (ALDACTONE) 100 MG tablet Take 1 tablet (100 mg total) by mouth daily. 30 tablet 1   traMADol (ULTRAM) 50 MG tablet Take 1 tablet (50 mg total) by mouth every 6 (six) hours as needed. (Patient not taking: Reported on 04/17/2021) 10 tablet 0   No current facility-administered medications for this visit.    Allergies as of 08/23/2021 - Review Complete 05/31/2021  Allergen Reaction Noted   Demerol Anaphylaxis 10/12/2010    Family History  Problem Relation Age of Onset   Diabetes Mother    Hypertension Mother    COPD Mother    Cirrhosis Father        ETOH   Diabetes Sister    Hypertension Sister    Crohn's disease Maternal Grandmother    Cancer Other    Colon cancer Neg Hx    Autoimmune disease Neg Hx     Social History   Socioeconomic History   Marital status: Married    Spouse name: Not on file   Number of children: Not on file   Years of education: Not on file   Highest education level: Not on file  Occupational History   Occupation: Unemployed    Comment: wants to go to nursing school in fall 2015  Tobacco Use   Smoking status: Every Day    Packs/day: 0.50    Years: 12.00    Total pack years: 6.00    Types: Cigarettes   Smokeless tobacco: Never  Vaping Use   Vaping Use: Never used  Substance and Sexual Activity   Alcohol use: Not Currently    Alcohol/week: 4.0 - 5.0 standard drinks of alcohol    Types: 4 - 5 Cans of beer per week    Comment: occ   Drug use: Yes    Types: Marijuana    Comment: occasional   Sexual activity: Yes    Birth control/protection: Surgical  Other Topics Concern   Not on file  Social History Narrative   Not on file   Social Determinants of Health   Financial Resource Strain: Not on file  Food Insecurity: Not on file  Transportation Needs: Not on  file  Physical Activity: Not on file  Stress: Not on file  Social Connections: Not on file    Review of Systems: Gen: Denies fever, chills, cold or flulike symptoms, presyncope, syncope. CV: Denies chest pain, palpitations. Resp: Denies dyspnea, cough.  GI: See HPI Heme: See HPI  Physical Exam: LMP  (LMP Unknown)  General:   Alert and oriented. No distress noted. Pleasant and cooperative.  Head:  Normocephalic and atraumatic. Eyes:  Conjuctiva clear without scleral icterus. Heart:  S1, S2 present without murmurs appreciated. Lungs:  Clear to auscultation bilaterally. No wheezes, rales, or rhonchi. No distress.  Abdomen:  +BS, soft, non-tender and non-distended. No rebound or guarding. No HSM or masses noted.  Msk:  Symmetrical without gross deformities. Normal posture. Extremities:  Without edema. Neurologic:  Alert and  oriented x4 Psych:  Normal mood and affect.    Assessment:     Plan:  ***   Ermalinda Memos, PA-C [facility] 08/23/2021

## 2021-08-23 ENCOUNTER — Other Ambulatory Visit: Payer: Self-pay | Admitting: Gastroenterology

## 2021-08-23 ENCOUNTER — Encounter: Payer: Self-pay | Admitting: Gastroenterology

## 2021-08-23 ENCOUNTER — Telehealth: Payer: Self-pay | Admitting: *Deleted

## 2021-08-23 ENCOUNTER — Telehealth: Payer: Self-pay | Admitting: Gastroenterology

## 2021-08-23 ENCOUNTER — Ambulatory Visit: Payer: Medicaid Other | Admitting: Gastroenterology

## 2021-08-23 VITALS — BP 117/71 | HR 74 | Temp 97.7°F | Ht 66.0 in | Wt 151.2 lb

## 2021-08-23 DIAGNOSIS — D509 Iron deficiency anemia, unspecified: Secondary | ICD-10-CM | POA: Diagnosis not present

## 2021-08-23 DIAGNOSIS — I8510 Secondary esophageal varices without bleeding: Secondary | ICD-10-CM

## 2021-08-23 DIAGNOSIS — R188 Other ascites: Secondary | ICD-10-CM

## 2021-08-23 DIAGNOSIS — R12 Heartburn: Secondary | ICD-10-CM | POA: Diagnosis not present

## 2021-08-23 DIAGNOSIS — R110 Nausea: Secondary | ICD-10-CM

## 2021-08-23 DIAGNOSIS — K7460 Unspecified cirrhosis of liver: Secondary | ICD-10-CM

## 2021-08-23 DIAGNOSIS — I8500 Esophageal varices without bleeding: Secondary | ICD-10-CM

## 2021-08-23 MED ORDER — NADOLOL 20 MG PO TABS: 20.0000 mg | ORAL_TABLET | Freq: Every day | ORAL | 0 refills | Status: DC

## 2021-08-23 MED ORDER — PROPRANOLOL HCL 10 MG PO TABS: 10.0000 mg | ORAL_TABLET | Freq: Two times a day (BID) | ORAL | 3 refills | Status: DC

## 2021-08-23 NOTE — Patient Instructions (Addendum)
Please have labs completed at [facility].  Start nadolol 20 mg daily.  This is to prevent bleeding from your known esophageal varices. Monitor your blood pressure and pulse daily for the next week.  Come back to our office in 1 week for nurse visit to have your blood pressure and pulse rechecked.  Please bring your log of blood pressures and pulse checks with you to give to the nurse.  Continue Lasix 20 mg daily and spironolactone 50 mg daily for now.  We will likely adjust these once I have your blood work back.  High-protein diet from a primarily plant-based diet. Avoid red meat.  No raw or undercooked meat, seafood, or shellfish. Low-fat/cholesterol/carbohydrate diet. Limit sodium to no more than 2000 mg/day including everything that you eat and drink. Recommend at least 30 minutes of aerobic and resistance exercise 3 days/week.  Let me know if you have any worsening swelling in your abdomen or development of abdominal pain.  Continue taking Protonix 40 mg twice daily 30 minutes before breakfast and dinner.  Avoid eating within 3 hours of going to bed.  Try eating 4-6 small meals daily rather than 3 large meals.  Avoid fried, fatty, greasy, spicy, citrus foods and carbonated beverages.  We will see you back in the office for routine visit in 2 months.    Ermalinda Memos, PA-C [facility]

## 2021-08-23 NOTE — Telephone Encounter (Signed)
Reviewed.  Looks like propranolol was covered.  Please let patient know nadolol is not covered by her insurance, so we will start her on propranolol 10 mg twice daily.  She should continue with plans to monitor her blood pressure and heart rate daily and keep a log of this for [place].  Return in 1 week for nurse visit for BP and heart rate recheck.  She should bring her log with her to her nurse visit so I can review.

## 2021-08-23 NOTE — Telephone Encounter (Signed)
Received eye examination record from [facility]   Eye exam completed 06/22/2021.  She was found to have no [employer] rings and no copper deposits on anterior limb surface.

## 2021-08-23 NOTE — Telephone Encounter (Signed)
Received denial letter for Nodolol 20 mg. Pt needs to try other medications first. I placed a list on your desk.

## 2021-08-24 ENCOUNTER — Other Ambulatory Visit: Payer: Self-pay | Admitting: *Deleted

## 2021-08-24 ENCOUNTER — Other Ambulatory Visit: Payer: Self-pay | Admitting: Gastroenterology

## 2021-08-24 DIAGNOSIS — R188 Other ascites: Secondary | ICD-10-CM

## 2021-08-24 DIAGNOSIS — R110 Nausea: Secondary | ICD-10-CM

## 2021-08-24 LAB — COMPLETE METABOLIC PANEL WITH GFR
AG Ratio: 1.2 (calc) (ref 1.0–2.5)
ALT: 19 U/L (ref 6–29)
AST: 43 U/L — ABNORMAL HIGH (ref 10–30)
Albumin: 3.5 g/dL — ABNORMAL LOW (ref 3.6–5.1)
Alkaline phosphatase (APISO): 73 U/L (ref 31–125)
BUN: 7 mg/dL (ref 7–25)
CO2: 28 mmol/L (ref 20–32)
Calcium: 9.1 mg/dL (ref 8.6–10.2)
Chloride: 103 mmol/L (ref 98–110)
Creat: 0.69 mg/dL (ref 0.50–0.97)
Globulin: 3 g/dL (calc) (ref 1.9–3.7)
Glucose, Bld: 100 mg/dL — ABNORMAL HIGH (ref 65–99)
Potassium: 4 mmol/L (ref 3.5–5.3)
Sodium: 136 mmol/L (ref 135–146)
Total Bilirubin: 2.5 mg/dL — ABNORMAL HIGH (ref 0.2–1.2)
Total Protein: 6.5 g/dL (ref 6.1–8.1)
eGFR: 113 mL/min/{1.73_m2} (ref >=60)

## 2021-08-24 LAB — CBC WITH DIFFERENTIAL/PLATELET
Absolute Monocytes: 219 cells/uL (ref 200–950)
Basophils Absolute: 30 cells/uL (ref 0–200)
Basophils Relative: 1.1 %
Eosinophils Absolute: 78 cells/uL (ref 15–500)
Eosinophils Relative: 2.9 %
HCT: 28 % — ABNORMAL LOW (ref 35.0–45.0)
Hemoglobin: 9.3 g/dL — ABNORMAL LOW (ref 11.7–15.5)
Lymphs Abs: 923 cells/uL (ref 850–3900)
MCH: 28.9 pg (ref 27.0–33.0)
MCHC: 33.2 g/dL (ref 32.0–36.0)
MCV: 87 fL (ref 80.0–100.0)
MPV: 9.2 fL (ref 7.5–12.5)
Monocytes Relative: 8.1 %
Neutro Abs: 1450 cells/uL — ABNORMAL LOW (ref 1500–7800)
Neutrophils Relative %: 53.7 %
Platelets: 117 10*3/uL — ABNORMAL LOW (ref 140–400)
RBC: 3.22 10*6/uL — ABNORMAL LOW (ref 3.80–5.10)
RDW: 14.8 % (ref 11.0–15.0)
Total Lymphocyte: 34.2 %
WBC: 2.7 10*3/uL — ABNORMAL LOW (ref 3.8–10.8)

## 2021-08-24 LAB — IRON,TIBC AND FERRITIN PANEL
%SAT: 9 % (calc) — ABNORMAL LOW (ref 16–45)
Ferritin: 22 ng/mL (ref 16–154)
Iron: 26 ug/dL — ABNORMAL LOW (ref 40–190)
TIBC: 285 mcg/dL (calc) (ref 250–450)

## 2021-08-24 LAB — PROTIME-INR
INR: 1.4 — ABNORMAL HIGH
Prothrombin Time: 14.6 s — ABNORMAL HIGH (ref 9.0–11.5)

## 2021-08-24 LAB — MAGNESIUM: Magnesium: 1.5 mg/dL (ref 1.5–2.5)

## 2021-08-24 MED ORDER — FUROSEMIDE 40 MG PO TABS: 40.0000 mg | ORAL_TABLET | Freq: Every day | ORAL | 3 refills | Status: DC

## 2021-08-24 MED ORDER — SPIRONOLACTONE 100 MG PO TABS: 100.0000 mg | ORAL_TABLET | Freq: Every day | ORAL | 3 refills | Status: DC

## 2021-08-24 MED ORDER — ONDANSETRON HCL 4 MG PO TABS: 4.0000 mg | ORAL_TABLET | Freq: Three times a day (TID) | ORAL | 0 refills | Status: DC | PRN

## 2021-08-24 NOTE — Telephone Encounter (Signed)
Spoke to to, informed of  recommendations. Pt voiced understanding. Informed to come next week for a BP and heart rate check and please bring log. Informed of medication change also.

## 2021-08-24 NOTE — Telephone Encounter (Signed)
Noted  

## 2021-09-04 ENCOUNTER — Telehealth: Payer: Self-pay | Admitting: *Deleted

## 2021-09-04 NOTE — Telephone Encounter (Signed)
Pt came in and dropped off BP log and I took BP here in the office 116/73. Copy of log placed on providers desk.

## 2021-09-05 LAB — BASIC METABOLIC PANEL
BUN: 12 mg/dL (ref 7–25)
CO2: 28 mmol/L (ref 20–32)
Calcium: 8.5 mg/dL — ABNORMAL LOW (ref 8.6–10.2)
Chloride: 102 mmol/L (ref 98–110)
Creat: 0.73 mg/dL (ref 0.50–0.97)
Glucose, Bld: 99 mg/dL (ref 65–99)
Potassium: 4.4 mmol/L (ref 3.5–5.3)
Sodium: 134 mmol/L — ABNORMAL LOW (ref 135–146)

## 2021-09-05 NOTE — Telephone Encounter (Signed)
Reviewed. July 29-31, HR ranging between 64-79, systolic BP 110-120, diastolic BP 52-64. In August 1-3, HR 62, 63, and 67, systolic BP 110-121, diastolic BP 61-73.   We may need to increase propranolol in the near future as the goal is for her HR to be between 5-60, but I would like to continue her current dose of 10 mg BID for now while I am monitoring her sodium. I would like for her to continue to monitor her BP and HR every day and keep a log for me.   See lab result note regarding mildly low sodium with recommendations to repeat BMP in 2 weeks.

## 2021-09-06 ENCOUNTER — Other Ambulatory Visit: Payer: Self-pay | Admitting: *Deleted

## 2021-09-06 DIAGNOSIS — K7460 Unspecified cirrhosis of liver: Secondary | ICD-10-CM

## 2021-09-06 NOTE — Telephone Encounter (Signed)
Spoke to pt, informed her of results and recommendations. Pt voiced understanding. Informed to continue to keep log of BP and HR and have labs done in 2 weeks. Labs entered into Epic.

## 2021-09-06 NOTE — Telephone Encounter (Signed)
Noted  

## 2021-10-10 LAB — BASIC METABOLIC PANEL
BUN: 10 mg/dL (ref 7–25)
CO2: 28 mmol/L (ref 20–32)
Calcium: 8.7 mg/dL (ref 8.6–10.2)
Chloride: 101 mmol/L (ref 98–110)
Creat: 0.86 mg/dL (ref 0.50–0.99)
Glucose, Bld: 109 mg/dL — ABNORMAL HIGH (ref 65–99)
Potassium: 3.7 mmol/L (ref 3.5–5.3)
Sodium: 136 mmol/L (ref 135–146)

## 2021-10-15 DIAGNOSIS — F1090 Alcohol use, unspecified, uncomplicated: Secondary | ICD-10-CM | POA: Insufficient documentation

## 2021-10-15 DIAGNOSIS — K3189 Other diseases of stomach and duodenum: Secondary | ICD-10-CM | POA: Insufficient documentation

## 2021-10-15 DIAGNOSIS — I8510 Secondary esophageal varices without bleeding: Secondary | ICD-10-CM | POA: Insufficient documentation

## 2021-10-17 ENCOUNTER — Telehealth: Payer: Self-pay | Admitting: *Deleted

## 2021-10-17 NOTE — Telephone Encounter (Signed)
Recall sent 

## 2021-10-17 NOTE — Telephone Encounter (Signed)
On recall for MRI ABD IN 6 MONTHS

## 2021-10-29 NOTE — Progress Notes (Unsigned)
Referring Provider: Neale Burly, MD Primary Care Physician:  Neale Burly, MD Primary GI Physician: Dr. Gala Romney  No chief complaint on file.   HPI:   Erica Banks is a 41 y.o. female with history of alcohol abuse, chronic anemia receiving iron infusions per hematology, anxiety, decompensated cirrhosis that has been complicated by ascites with positive gram stain but culture and wbc count negative/not consistent with SBP in Feb/March 2023, PHG and esophageal varices, and coagulopathy.   Extensive serologic evaluation of cirrhosis with  AIH serologies negative, acute hepatitis panel negative without immunity to hep A or hep B, ceruloplasmin low at 11.5.  Repeat ceruloplasmin in March was 20. 24 hour urine copper <5 making wilson's disease less likely. Follow-up eye examination with ophthalmology which was negative for [employer] rings.  She was also found to have liver lesion on CT in February. Follow-up MRI in April with LI-RADS category 3 hepatic lesion (intermediate probability of malignancy) with recommendations for follow-up MRI in 3-6 months with and without contrast. Dr. Gala Romney recommended 6 month repeat. AFP wnl.   EGD in March 2023 with  grade 1 esophageal varices, portal hypertensive gastropathy with friable tissue with spontaneous ooze, normal duodenum.   EGD and colonoscopy in May 2023 with grade 1-2 esophageal varices without bleeding stigmata, advanced appearing portal hypertensive gastropathy. Colonoscopy with very large nonbleeding internal and external hemorrhoids, portal colopathy.  Recommended repeat colonoscopy in 10 years.    Last seen in our office 08/23/2021.  She had felt her abdomen was getting a bit swollen for the last 3 days, but improved day of office visit.  Occasional abdominal pain, but nothing consistent.  No lower extremity edema.  PCP had decreased Lasix and spironolactone to 20 mg and 50 mg respectively.  Patient states she was told this was a  lot on her kidneys.  Denies any alcohol since hospitalization in February/March.  Reported morning nausea occasional nocturnal nausea for the last couple weeks.  No reflux symptoms on Protonix twice daily.  No vomiting, early satiety, dysphagia.  She was eating with an 3 hours of going to bed.  No overt GI bleeding.  Most recent labs with hemoglobin stable at 10, iron low.  Reported having some fatigue.  Had upcoming appointment with hematology the following week.  Planned to update labs, start nadolol 20 mg daily, increase diuretics pending lab results, continue Protonix twice daily.  Nadolol was denied and she was started on propranolol 10 mg twice daily  Labs showed hemoglobin of 9.3, platelets 117, total bilirubin elevated at 2.5, AST 43, ALT and alk phos within normal limits, INR 1.4, iron 26, iron saturation 9%, ferritin 22.  MELD 16.  She was referred to hepatology clinic in [place] to discuss liver transplant. Recommended increasing Lasix to 40 mg and spironolactone to 100 mg daily.    Patient saw Roosevelt Locks, NP with hepatology clinic 10/15/2021.  Stated MELD is not in a competitive range for transplant.  Recommended intensive outpatient program for alcohol to prevent recidivism inquired whether she may have some improvement in hepatic function with ongoing abstinence.  Noted good rate control on propranolol 10 mg daily and did not recommend further increasing her beta-blocker due to mild hypotension.  Doing well on current diuretic regimen. 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  Plan to screen for alpha-1 antitrypsin deficiency (which was negative), follow-up in  6 months.  Most recent labs 9/27 with hemoglobin 11.7, platelets 112, iron 113, iron saturation 61%, ferritin 93.3, total bilirubin 3.7, AST 82, ALT 42, alk phos 79.   Today:   Ascites/peripheral  edema:  Diuretics:   Encephalopathy:   Variceal prophylaxis:   Labs: MELD Na 16 in July. Bilirubin has increased to 3.7 from 2.5, but both direct and indirect were elevated on 9/18.  Direct bilirubin 1.35, indirect bilirubin 2.35. Imaging:  Needs MRI liver with and without contrast to follow-up on liver lesion. Hepatitis A/B vaccination:  GERD:   Anemia:   Past Medical History:  Diagnosis Date   Anemia    Cirrhosis ([place])    DVT (deep venous thrombosis) (HCC)    Low iron    Neuropathy    Neuropathy    Panic attacks    Pulmonary embolus ([place]) 2019    Past Surgical History:  Procedure Laterality Date   ABDOMINAL HYSTERECTOMY     BIOPSY N/A 06/24/2013   Procedure: BIOPSY TERMINAL ILEUM;  Surgeon: Daneil Dolin, MD;  [facility];  Service: Endoscopy;  Laterality: N/A;   CHOLECYSTECTOMY     COLONOSCOPY N/A 06/24/2013   [id number] hemorrhoids/otherwise normal    COLONOSCOPY WITH PROPOFOL N/A 05/31/2021   Procedure: COLONOSCOPY WITH PROPOFOL;  Surgeon: Daneil Dolin, MD;  [facility];  Service: Endoscopy;  Laterality: N/A;  12:30pm, asa 3   ESOPHAGOGASTRODUODENOSCOPY (EGD) WITH PROPOFOL N/A 03/29/2021   Procedure: ESOPHAGOGASTRODUODENOSCOPY (EGD) WITH PROPOFOL;  Surgeon: Eloise Harman, DO;  [facility];  Service: Endoscopy;  Laterality: N/A;   ESOPHAGOGASTRODUODENOSCOPY (EGD) WITH PROPOFOL N/A 05/31/2021   Procedure: ESOPHAGOGASTRODUODENOSCOPY (EGD) WITH PROPOFOL;  Surgeon: Daneil Dolin, MD;  [facility];  Service: Endoscopy;  Laterality: N/A;   nasal bone surgery     NASAL SINUS SURGERY     SINUS IRRIGATION     TONSILLECTOMY     TUBAL LIGATION      Current Outpatient Medications  Medication Sig Dispense Refill   furosemide (LASIX) 40 MG tablet Take 1 tablet (40 mg total) by mouth daily. 30 tablet 3   gabapentin (NEURONTIN) 300 MG capsule Take 300 mg by mouth 3 (three) times daily as needed (nerve pain).      ondansetron (ZOFRAN) 4 MG tablet Take 1 tablet (4 mg total) by mouth every 8 (eight) hours as needed for nausea or vomiting. 20 tablet 0   pantoprazole (PROTONIX) 40 MG tablet Take 1 tablet (40 mg total) by mouth 2 (two) times daily before a meal. 60 tablet 3   propranolol (INDERAL) 10 MG tablet Take 1 tablet (10 mg total) by mouth 2 (two) times daily. 60 tablet 3   spironolactone (ALDACTONE) 100 MG tablet Take 1 tablet (100 mg total) by mouth daily. 30 tablet 3   No current facility-administered medications for this visit.    Allergies as of 10/31/2021 - Review Complete 08/23/2021  Allergen Reaction Noted   Demerol Anaphylaxis 10/12/2010    Family History  Problem Relation Age of Onset   Diabetes Mother    Hypertension Mother    COPD Mother    Cirrhosis Father        ETOH   Diabetes Sister    Hypertension Sister    Crohn's disease Maternal Grandmother    Cancer Other    Colon cancer Neg Hx    Autoimmune disease Neg Hx     Social History   Socioeconomic History   Marital status:  Married    Spouse name: Not on file   Number of children: Not on file   Years of education: Not on file   Highest education level: Not on file  Occupational History   Occupation: Unemployed    Comment: wants to go to nursing school in fall 2015  Tobacco Use   Smoking status: Every Day    Packs/day: 0.50    Years: 12.00    Total pack years: 6.00    Types: Cigarettes   Smokeless tobacco: Never  Vaping Use   Vaping Use: Never used  Substance and Sexual Activity   Alcohol use: Not Currently    Alcohol/week: 4.0 - 5.0 standard drinks of alcohol    Types: 4 - 5 Cans of beer per week    Comment: None since February 2023.   Drug use: Yes    Types: Marijuana    Comment: occasional   Sexual activity: Yes    Birth control/protection: Surgical  Other Topics Concern   Not on file  Social History Narrative   Not on file   Social Determinants of Health   Financial Resource Strain: Not on file   Food Insecurity: Not on file  Transportation Needs: Not on file  Physical Activity: Not on file  Stress: Not on file  Social Connections: Not on file    Review of Systems: Gen: Denies fever, chills, cold or flulike symptoms, presyncope, syncope. CV: Denies chest pain, palpitations. Resp: Denies dyspnea, cough.  GI: See HPI Heme: See HPI  Physical Exam: LMP  (LMP Unknown)  General:   Alert and oriented. No distress noted. Pleasant and cooperative.  Head:  Normocephalic and atraumatic. Eyes:  Conjuctiva clear without scleral icterus. Heart:  S1, S2 present without murmurs appreciated. Lungs:  Clear to auscultation bilaterally. No wheezes, rales, or rhonchi. No distress.  Abdomen:  +BS, soft, non-tender and non-distended. No rebound or guarding. No HSM or masses noted. Msk:  Symmetrical without gross deformities. Normal posture. Extremities:  Without edema. Neurologic:  Alert and  oriented x4 Psych:  Normal mood and affect.    Assessment:     Plan:  ***   Aliene Altes, PA-C [facility] 10/31/2021

## 2021-10-31 ENCOUNTER — Encounter: Payer: Self-pay | Admitting: Gastroenterology

## 2021-10-31 ENCOUNTER — Telehealth: Payer: Self-pay | Admitting: *Deleted

## 2021-10-31 ENCOUNTER — Ambulatory Visit: Payer: Medicaid Other | Admitting: Gastroenterology

## 2021-10-31 VITALS — BP 92/67 | HR 64 | Temp 98.2°F | Ht 66.0 in | Wt 147.2 lb

## 2021-10-31 DIAGNOSIS — K7460 Unspecified cirrhosis of liver: Secondary | ICD-10-CM | POA: Diagnosis not present

## 2021-10-31 DIAGNOSIS — R188 Other ascites: Secondary | ICD-10-CM

## 2021-10-31 DIAGNOSIS — I8500 Esophageal varices without bleeding: Secondary | ICD-10-CM | POA: Diagnosis not present

## 2021-10-31 DIAGNOSIS — K649 Unspecified hemorrhoids: Secondary | ICD-10-CM

## 2021-10-31 DIAGNOSIS — R110 Nausea: Secondary | ICD-10-CM

## 2021-10-31 DIAGNOSIS — D509 Iron deficiency anemia, unspecified: Secondary | ICD-10-CM | POA: Diagnosis not present

## 2021-10-31 DIAGNOSIS — K769 Liver disease, unspecified: Secondary | ICD-10-CM

## 2021-10-31 DIAGNOSIS — K219 Gastro-esophageal reflux disease without esophagitis: Secondary | ICD-10-CM

## 2021-10-31 MED ORDER — HYDROCORTISONE (PERIANAL) 2.5 % EX CREA: 1.0000 | TOPICAL_CREAM | Freq: Two times a day (BID) | CUTANEOUS | 1 refills | Status: DC

## 2021-10-31 MED ORDER — FAMOTIDINE 40 MG PO TABS: 40.0000 mg | ORAL_TABLET | Freq: Every day | ORAL | 3 refills | Status: DC

## 2021-10-31 NOTE — Patient Instructions (Addendum)
We will arrange to have an MRI of your liver in the near future at [facility].  Increase lactulose to 30 mL twice daily.  You can increase to 3 times daily if needed. The goal is to have 2-3 bowel movements every day.    Continue taking Lasix 40 mg daily and spironolactone 100 mg daily.  Continue propranolol 10 mg daily.  Continue to monitor your heart rate and blood pressure.  If your blood pressure drops any lower than it currently is, please let me know.  Top number need to stay above 90.  Please follow-up with [place] regarding completing your hepatitis B vaccine series.  For reflux: Continue pantoprazole 40 mg twice daily 30 minutes before breakfast and dinner. Follow a GERD diet:  Avoid fried, fatty, greasy, spicy, citrus foods. Avoid caffeine and carbonated beverages. Avoid chocolate. Try eating 4-6 small meals a day rather than 3 large meals. Do not eat within 3 hours of laying down. Prop head of bed up on wood or bricks to create a 6 inch incline.  For nausea:  This may be secondary to reflux occurring during the night. Start famotidine 40 mg at bedtime.  I have sent a prescription to your pharmacy.  For hemorrhoids: Use Anusol rectal cream twice daily for 5-7 days at a time, then again as needed.  Limit toilet time to 2-3 minutes. Avoid straining.   You will be due for routine labs in January.  I will have nursing staff arrange this for you.  We will follow-up with you in 6 months.  Please call sooner if you have any questions or concerns.  Aliene Altes, PA-C [facility]

## 2021-10-31 NOTE — Telephone Encounter (Signed)
PA submitted for MRI, pending review at this time

## 2021-11-01 ENCOUNTER — Other Ambulatory Visit: Payer: Self-pay | Admitting: *Deleted

## 2021-11-01 NOTE — Telephone Encounter (Signed)
Request Date: 10/31/2021 07:08 AM Status: Approved Entry Method: RadMD Validity Dates: 10/31/2021-11/30/2021  Request ID:  [id number]: [id number]

## 2021-11-01 NOTE — Progress Notes (Addendum)
Received letter of denial letter for Pro[employer] 2.5. Pt needs to try something else.

## 2021-11-02 ENCOUNTER — Telehealth: Payer: Self-pay | Admitting: *Deleted

## 2021-11-02 NOTE — Telephone Encounter (Signed)
Pt informed of MRI appointment date ,time and instructions. Verbalized understanding

## 2021-11-02 NOTE — Telephone Encounter (Signed)
Pt informed that MRI is scheduled for 11/26/21 at 11am, arrive at 10;30 am, nothing to eat or drink 4 hours prior to procedure. Verbalized understanding.

## 2021-11-02 NOTE — Telephone Encounter (Signed)
MRI scheduled at [facility] for 11/26/21 at 11:00 am ,arrive at 10:30 am, nothing to eat or drink 4 hours prior to procedure.  [place]

## 2021-11-22 ENCOUNTER — Other Ambulatory Visit: Payer: Self-pay

## 2021-11-22 ENCOUNTER — Emergency Department (HOSPITAL_COMMUNITY): Payer: Medicaid Other

## 2021-11-22 ENCOUNTER — Inpatient Hospital Stay (HOSPITAL_COMMUNITY)
Admission: EM | Admit: 2021-11-22 | Discharge: 2021-11-24 | DRG: 812 | Disposition: A | Discharge status: Home / Self Care | Payer: Medicaid Other | Attending: Internal Medicine | Admitting: Internal Medicine

## 2021-11-22 ENCOUNTER — Encounter (HOSPITAL_COMMUNITY): Payer: Self-pay | Admitting: Emergency Medicine

## 2021-11-22 ENCOUNTER — Telehealth: Payer: Self-pay | Admitting: *Deleted

## 2021-11-22 DIAGNOSIS — Z833 Family history of diabetes mellitus: Secondary | ICD-10-CM

## 2021-11-22 DIAGNOSIS — D638 Anemia in other chronic diseases classified elsewhere: Secondary | ICD-10-CM | POA: Diagnosis present

## 2021-11-22 DIAGNOSIS — Z8249 Family history of ischemic heart disease and other diseases of the circulatory system: Secondary | ICD-10-CM

## 2021-11-22 DIAGNOSIS — K7040 Alcoholic hepatic failure without coma: Secondary | ICD-10-CM | POA: Diagnosis present

## 2021-11-22 DIAGNOSIS — K648 Other hemorrhoids: Secondary | ICD-10-CM | POA: Diagnosis present

## 2021-11-22 DIAGNOSIS — Z79899 Other long term (current) drug therapy: Secondary | ICD-10-CM

## 2021-11-22 DIAGNOSIS — K766 Portal hypertension: Secondary | ICD-10-CM | POA: Diagnosis present

## 2021-11-22 DIAGNOSIS — K219 Gastro-esophageal reflux disease without esophagitis: Secondary | ICD-10-CM | POA: Diagnosis present

## 2021-11-22 DIAGNOSIS — Z23 Encounter for immunization: Secondary | ICD-10-CM

## 2021-11-22 DIAGNOSIS — E8809 Other disorders of plasma-protein metabolism, not elsewhere classified: Secondary | ICD-10-CM | POA: Diagnosis present

## 2021-11-22 DIAGNOSIS — F17200 Nicotine dependence, unspecified, uncomplicated: Secondary | ICD-10-CM

## 2021-11-22 DIAGNOSIS — K7682 Hepatic encephalopathy: Secondary | ICD-10-CM | POA: Diagnosis present

## 2021-11-22 DIAGNOSIS — K7460 Unspecified cirrhosis of liver: Secondary | ICD-10-CM | POA: Diagnosis present

## 2021-11-22 DIAGNOSIS — D649 Anemia, unspecified: Principal | ICD-10-CM

## 2021-11-22 DIAGNOSIS — D509 Iron deficiency anemia, unspecified: Principal | ICD-10-CM | POA: Diagnosis present

## 2021-11-22 DIAGNOSIS — K7031 Alcoholic cirrhosis of liver with ascites: Secondary | ICD-10-CM | POA: Diagnosis present

## 2021-11-22 DIAGNOSIS — Z86718 Personal history of other venous thrombosis and embolism: Secondary | ICD-10-CM

## 2021-11-22 DIAGNOSIS — F17210 Nicotine dependence, cigarettes, uncomplicated: Secondary | ICD-10-CM | POA: Diagnosis present

## 2021-11-22 DIAGNOSIS — Z825 Family history of asthma and other chronic lower respiratory diseases: Secondary | ICD-10-CM

## 2021-11-22 DIAGNOSIS — Z86711 Personal history of pulmonary embolism: Secondary | ICD-10-CM

## 2021-11-22 DIAGNOSIS — R188 Other ascites: Secondary | ICD-10-CM | POA: Diagnosis present

## 2021-11-22 DIAGNOSIS — Z560 Unemployment, unspecified: Secondary | ICD-10-CM

## 2021-11-22 DIAGNOSIS — E876 Hypokalemia: Secondary | ICD-10-CM | POA: Diagnosis present

## 2021-11-22 DIAGNOSIS — Z90710 Acquired absence of both cervix and uterus: Secondary | ICD-10-CM

## 2021-11-22 LAB — COMPREHENSIVE METABOLIC PANEL
ALT: 35 U/L (ref 0–44)
AST: 75 U/L — ABNORMAL HIGH (ref 15–41)
Albumin: 2.9 g/dL — ABNORMAL LOW (ref 3.5–5.0)
Alkaline Phosphatase: 67 U/L (ref 38–126)
Anion gap: 6 (ref 5–15)
BUN: 10 mg/dL (ref 6–20)
CO2: 26 mmol/L (ref 22–32)
Calcium: 7.9 mg/dL — ABNORMAL LOW (ref 8.9–10.3)
Chloride: 99 mmol/L (ref 98–111)
Creatinine, Ser: 0.94 mg/dL (ref 0.44–1.00)
GFR, Estimated: >60 mL/min (ref >60)
Glucose, Bld: 103 mg/dL — ABNORMAL HIGH (ref 70–99)
Potassium: 3.1 mmol/L — ABNORMAL LOW (ref 3.5–5.1)
Sodium: 131 mmol/L — ABNORMAL LOW (ref 135–145)
Total Bilirubin: 3.4 mg/dL — ABNORMAL HIGH (ref 0.3–1.2)
Total Protein: 6.3 g/dL — ABNORMAL LOW (ref 6.5–8.1)

## 2021-11-22 LAB — PROTIME-INR
INR: 1.8 — ABNORMAL HIGH (ref 0.8–1.2)
Prothrombin Time: 20.5 seconds — ABNORMAL HIGH (ref 11.4–15.2)

## 2021-11-22 LAB — CBC WITH DIFFERENTIAL/PLATELET
Abs Immature Granulocytes: 0.01 10*3/uL (ref 0.00–0.07)
Basophils Absolute: 0 10*3/uL (ref 0.0–0.1)
Basophils Relative: 1 %
Eosinophils Absolute: 0.1 10*3/uL (ref 0.0–0.5)
Eosinophils Relative: 2 %
HCT: 22.4 % — ABNORMAL LOW (ref 36.0–46.0)
Hemoglobin: 7.5 g/dL — ABNORMAL LOW (ref 12.0–15.0)
Immature Granulocytes: 0 %
Lymphocytes Relative: 35 %
Lymphs Abs: 1.6 10*3/uL (ref 0.7–4.0)
MCH: 30.5 pg (ref 26.0–34.0)
MCHC: 33.5 g/dL (ref 30.0–36.0)
MCV: 91.1 fL (ref 80.0–100.0)
Monocytes Absolute: 0.5 10*3/uL (ref 0.1–1.0)
Monocytes Relative: 10 %
Neutro Abs: 2.4 10*3/uL (ref 1.7–7.7)
Neutrophils Relative %: 52 %
Platelets: 113 10*3/uL — ABNORMAL LOW (ref 150–400)
RBC: 2.46 MIL/uL — ABNORMAL LOW (ref 3.87–5.11)
RDW: 15.2 % (ref 11.5–15.5)
WBC: 4.6 10*3/uL (ref 4.0–10.5)
nRBC: 0 % (ref 0.0–0.2)

## 2021-11-22 LAB — TROPONIN I (HIGH SENSITIVITY)
Troponin I (High Sensitivity): 4 ng/L (ref <18)
Troponin I (High Sensitivity): 4 ng/L (ref <18)

## 2021-11-22 LAB — PREPARE RBC (CROSSMATCH)

## 2021-11-22 LAB — POC OCCULT BLOOD, ED: Fecal Occult Bld: NEGATIVE

## 2021-11-22 MED ORDER — POTASSIUM CHLORIDE 20 MEQ PO PACK
40.0000 meq | PACK | Freq: Once | ORAL | Status: AC
  Administered 2021-11-22: 40 meq via ORAL
  Filled 2021-11-22: qty 2

## 2021-11-22 MED ORDER — MAGNESIUM SULFATE 2 GM/50ML IV SOLN
2.0000 g | Freq: Once | INTRAVENOUS | Status: AC
  Administered 2021-11-22: 2 g via INTRAVENOUS
  Filled 2021-11-22: qty 50

## 2021-11-22 MED ORDER — LACTATED RINGERS IV BOLUS
1000.0000 mL | Freq: Once | INTRAVENOUS | Status: AC
  Administered 2021-11-22: 1000 mL via INTRAVENOUS

## 2021-11-22 MED ORDER — CALCIUM GLUCONATE-NACL 1-0.675 GM/50ML-% IV SOLN
1.0000 g | INTRAVENOUS | Status: AC
  Administered 2021-11-22 (×2): 1000 mg via INTRAVENOUS

## 2021-11-22 MED ORDER — CALCIUM GLUCONATE-NACL 2-0.675 GM/100ML-% IV SOLN
2.0000 g | Freq: Once | INTRAVENOUS | Status: DC
  Filled 2021-11-22: qty 100

## 2021-11-22 MED ORDER — SODIUM CHLORIDE 0.9 % IV SOLN: 10.0000 mL/h | Freq: Once | INTRAVENOUS | Status: DC

## 2021-11-22 MED ORDER — ACETAMINOPHEN 325 MG PO TABS
650.0000 mg | ORAL_TABLET | Freq: Once | ORAL | Status: AC
  Administered 2021-11-22: 650 mg via ORAL
  Filled 2021-11-22: qty 2

## 2021-11-22 NOTE — Telephone Encounter (Signed)
Pt called and states her heart rate and blood pressure has been low. States she has stop taking the Propranolol 10mg , but wants to know if she can take half a tablet. Please advise.

## 2021-11-22 NOTE — Telephone Encounter (Signed)
What was her BP and HR while taking Propranolol? What is it now that she has stopped taking it?

## 2021-11-22 NOTE — Telephone Encounter (Signed)
Pt states her lowest BP was 98/45 and lowest HR was 58. Now that she has stop taking Propranolol BP is 120/70 and HR 60.

## 2021-11-22 NOTE — ED Triage Notes (Signed)
Patient has history of chronic anemia and was told that her hcg was 8.5 a week ago.  Patient also reports bright red blood in stools since then and was told by her infusion RN to come to ER today.  Patient also endorses chest "heaviness" and shob.

## 2021-11-22 NOTE — Telephone Encounter (Signed)
Spoke to pt, informed her of recommendations. Pt voiced understanding. Informed to call in a week and let me know the BP and HR.

## 2021-11-22 NOTE — ED Provider Notes (Signed)
[facility] EMERGENCY DEPARTMENT Provider Note   CSN: [id number] Arrival date & time: 11/22/21  2009     History  Chief Complaint  Patient presents with   Rectal Bleeding   Shortness of Breath   Chest Pain    Erica Banks is a 41 y.o. female with past medical history significant for chronic anemia, cirrhosis, hemorrhoids, previous PE, DVT, who presents with concern for blood loss, symptomatic anemia, heaviness, shortness of breath, bleeding hemorrhoids.  Patient reports that she does not have any bleeding in between bowel movements but has bleeding hemorrhoids with any straining bowel movement.  She reports no active alcohol use recently, but does have a history of alcoholic cirrhosis.  Patient reports that she began having the symptomatic anemia before the liver disease.  Patient is having significant cramping in the lower extremities at this time.   Rectal Bleeding Shortness of Breath Associated symptoms: chest pain   Chest Pain Associated symptoms: shortness of breath        Home Medications Prior to Admission medications   Medication Sig Start Date End Date Taking? Authorizing Provider  furosemide (LASIX) 40 MG tablet Take 1 tablet (40 mg total) by mouth daily. 08/24/21  Yes Ermalinda Memos S, PA-C  gabapentin (NEURONTIN) 300 MG capsule Take 300 mg by mouth 3 (three) times daily as needed (nerve pain). 02/25/21  Yes [provider]  hydrOXYzine (VISTARIL) 25 MG capsule Take 25 mg by mouth 3 (three) times daily as needed for anxiety. 11/13/21  Yes [provider]  ondansetron (ZOFRAN) 4 MG tablet Take 1 tablet (4 mg total) by mouth every 8 (eight) hours as needed for nausea or vomiting. 08/24/21 08/24/22 Yes Clearance Coots, Gwendalyn Ege, PA-C  pantoprazole (PROTONIX) 40 MG tablet Take 1 tablet (40 mg total) by mouth 2 (two) times daily before a meal. 03/30/21  Yes Erick Blinks, MD  spironolactone (ALDACTONE) 100 MG tablet Take 1 tablet (100 mg total) by mouth daily.  08/24/21  Yes Harper, Kristen S, PA-C  CONSTULOSE 10 GM/15ML solution Take 20 g by mouth 3 (three) times daily. Patient not taking: Reported on 11/23/2021 10/16/21   [provider]  famotidine (PEPCID) 40 MG tablet Take 1 tablet (40 mg total) by mouth at bedtime. Patient not taking: Reported on 11/23/2021 10/31/21   Letta Median, PA-C  hydrocortisone (ANUSOL-HC) 2.5 % rectal cream Place 1 Application rectally 2 (two) times daily. 10/31/21   Letta Median, PA-C      Allergies    Demerol    Review of Systems   Review of Systems  Respiratory:  Positive for chest tightness and shortness of breath.   Cardiovascular:  Positive for chest pain.  Gastrointestinal:  Positive for hematochezia.  All other systems reviewed and are negative.   Physical Exam Updated Vital Signs BP 125/71 (BP Location: Left Arm)   Pulse 69   Temp 97.7 F (36.5 C)   Resp 16   Ht 5\' 6"  (1.676 m)   Wt 68.7 kg   LMP  (LMP Unknown)   SpO2 99%   BMI 24.45 kg/m  Physical Exam Vitals and nursing note reviewed.  Constitutional:      General: She is not in acute distress.    Appearance: Normal appearance. She is ill-appearing.  HENT:     Head: Normocephalic and atraumatic.  Eyes:     General: Scleral icterus present.        Right eye: No discharge.        Left  eye: No discharge.  Cardiovascular:     Rate and Rhythm: Normal rate and regular rhythm.     Heart sounds: No murmur heard.    No friction rub. No gallop.  Pulmonary:     Effort: Pulmonary effort is normal.     Breath sounds: Normal breath sounds.  Abdominal:     General: Bowel sounds are normal.     Palpations: Abdomen is soft.  Genitourinary:    Comments: Severe thrombosed hemorrhoids with no active bleeding at this time, no stool in rectal vault, no evidence of melanotic stool noted Skin:    General: Skin is warm and dry.     Capillary Refill: Capillary refill takes less than 2 seconds.  Neurological:     Mental Status: She is  alert and oriented to person, place, and time.  Psychiatric:        Mood and Affect: Mood normal.        Behavior: Behavior normal.     ED Results / Procedures / Treatments   Labs (all labs ordered are listed, but only abnormal results are displayed) Labs Reviewed  CBC WITH DIFFERENTIAL/PLATELET - Abnormal; Notable for the following components:      Result Value   RBC 2.46 (*)    Hemoglobin 7.5 (*)    HCT 22.4 (*)    Platelets 113 (*)    All other components within normal limits  COMPREHENSIVE METABOLIC PANEL - Abnormal; Notable for the following components:   Sodium 131 (*)    Potassium 3.1 (*)    Glucose, Bld 103 (*)    Calcium 7.9 (*)    Total Protein 6.3 (*)    Albumin 2.9 (*)    AST 75 (*)    Total Bilirubin 3.4 (*)    All other components within normal limits  PROTIME-INR - Abnormal; Notable for the following components:   Prothrombin Time 20.5 (*)    INR 1.8 (*)    All other components within normal limits  COMPREHENSIVE METABOLIC PANEL - Abnormal; Notable for the following components:   Sodium 134 (*)    Glucose, Bld 108 (*)    Calcium 8.4 (*)    Total Protein 5.6 (*)    Albumin 2.7 (*)    AST 66 (*)    Total Bilirubin 2.9 (*)    All other components within normal limits  CBC WITH DIFFERENTIAL/PLATELET - Abnormal; Notable for the following components:   WBC 3.9 (*)    RBC 2.62 (*)    Hemoglobin 7.8 (*)    HCT 23.8 (*)    Platelets 103 (*)    Abs Immature Granulocytes 0.20 (*)    All other components within normal limits  IRON AND TIBC - Abnormal; Notable for the following components:   Iron 19 (*)    TIBC 243 (*)    Saturation Ratios 8 (*)    All other components within normal limits  RETICULOCYTES - Abnormal; Notable for the following components:   RBC. 2.48 (*)    Immature Retic Fract 21.3 (*)    All other components within normal limits  MAGNESIUM  TSH  VITAMIN B12  FOLATE  FERRITIN  POC OCCULT BLOOD, ED  TYPE AND SCREEN  PREPARE RBC  (CROSSMATCH)  PREPARE RBC (CROSSMATCH)  TROPONIN I (HIGH SENSITIVITY)  TROPONIN I (HIGH SENSITIVITY)    EKG EKG Interpretation  Date/Time:  Thursday November 22 2021 20:19:14 EDT Ventricular Rate:  91 PR Interval:  130 QRS Duration: 76 QT Interval:  382 QTC Calculation: 469 R Axis:   33 Text Interpretation: Normal sinus rhythm Cannot rule out Anterior infarct (cited on or before 24-Aug-2020) Abnormal ECG When compared with ECG of 24-Aug-2020 14:02, Nonspecific T wave abnormality no longer evident in Anterolateral leads Confirmed by Delora Fuel ([id number]) on 11/23/2021 1:04:34 AM  Radiology No results found.  Procedures .Critical Care  Performed by: Anselmo Pickler, PA-C Authorized by: Anselmo Pickler, PA-C   Critical care provider statement:    Critical care time (minutes):  33   Critical care was necessary to treat or prevent imminent or life-threatening deterioration of the following conditions:  Circulatory failure (symptomatic anemia requiring transfusion)   Critical care was time spent personally by me on the following activities:  Development of treatment plan with patient or surrogate, discussions with consultants, evaluation of patient's response to treatment, examination of patient, ordering and review of laboratory studies, ordering and review of radiographic studies, ordering and performing treatments and interventions, pulse oximetry, re-evaluation of patient's condition and review of old charts   Care discussed with: admitting provider       Medications Ordered in ED Medications  lactated ringers bolus 1,000 mL (0 mLs Intravenous Stopped 11/22/21 2311)  acetaminophen (TYLENOL) tablet 650 mg (650 mg Oral Given 11/22/21 2202)  potassium chloride (KLOR-CON) packet 40 mEq (40 mEq Oral Given 11/22/21 2202)  magnesium sulfate IVPB 2 g 50 mL (0 g Intravenous Stopped 11/22/21 2323)  calcium gluconate 1 g/ 50 mL sodium chloride IVPB (0 mg Intravenous Stopped  11/23/21 0059)  pneumococcal 23 valent vaccine (PNEUMOVAX-23) injection 0.5 mL (0.5 mLs Intramuscular Given 11/24/21 1109)  furosemide (LASIX) injection 40 mg (40 mg Intravenous Given 11/23/21 0518)  iron sucrose (VENOFER) 500 mg in sodium chloride 0.9 % 250 mL IVPB (0 mg Intravenous Stopped 11/23/21 1718)  furosemide (LASIX) injection 20 mg (20 mg Intravenous Given 11/23/21 1027)  potassium chloride SA (KLOR-CON M) CR tablet 40 mEq (40 mEq Oral Given 11/23/21 1926)    ED Course/ Medical Decision Making/ A&P Clinical Course as of 11/26/21 0756  Thu Nov 22, 2021  2157 Patient with multiple severe prolapsed hemorrhoids but no active bleeding at this time, no stool in rectal vault, no evidence of melena patient reports that bleeding is only present with straining [CP]    Clinical Course User Index [CP] Anselmo Pickler, PA-C                           Medical Decision Making Amount and/or Complexity of Data Reviewed Labs: ordered. Radiology: ordered.  Risk OTC drugs. Prescription drug management. Decision regarding hospitalization.   This patient is a 41 y.o. female who presents to the ED for concern of shortness of breath, heaviness, symptomatic anemia with history of same, this involves an extensive number of treatment options, and is a complaint that carries with it a high risk of complications and morbidity. The emergent differential diagnosis prior to evaluation includes, but is not limited to, new for or lower GI bleeding, bleeding hemorrhoids, or other causes of symptomatic anemia  This is not an exhaustive differential.   Past Medical History / Co-morbidities / Social History: Previous DVT, PE, alcoholic cirrhosis, patient does not currently take any blood thinners, history of chronic anemia, history of hemorrhoids  Additional history: Chart reviewed. Pertinent results include: Reviewed outpatient GI visits, lab work, imaging from recent previous emergency department  visits  Physical Exam: Physical exam performed. The pertinent findings include:  On exam patient has pale mucous membranes, but is otherwise in no acute distress, she does have some significant prolapsed hemorrhoids but with no evidence of thrombosed hemorrhoids, no active bleeding from the rectum, no significant stool in rectal vault at this time  Lab Tests: I ordered, and personally interpreted labs.  The pertinent results include: CBC is notable for hyponatremia, sodium 131, we will replete with fluids, potassium 3.1, we will orally replete.  Her liver enzymes are fairly stable but total bilirubin is elevated at 3.4 today, favor some additional hemoglobin breakdown, she has low total protein and albumin as well as hypocalcemia of 7.9, which is likely responsible for her cramping along with moderate dehydration.  On CBC she has hemoglobin of 7.5 down around one-point from recent current baseline, with gradual progressive decline over the last several weeks to months.   Imaging Studies: I ordered imaging studies including plain film chest x-ray. I independently visualized and interpreted imaging which showed no acute intrathoracic abnormality. I agree with the radiologist interpretation.   Cardiac Monitoring:  The patient was maintained on a cardiac monitor.  My attending physician Dr. Posey Rea viewed and interpreted the cardiac monitored which showed an underlying rhythm of: NSR. I agree with this interpretation.   Medications: I ordered medication including potassium, magnesium, calcium, fluids, Tylenol, and 1 unit packed red blood cells for electrolyte abnormalities, dehydration, and anemia. Reevaluation of the patient after these medicines showed that the patient improved. I have reviewed the patients home medicines and have made adjustments as needed.  Consultations Obtained: I requested consultation with the hospitalist, spoke with Dr. Carren Rang,  and discussed lab and imaging findings as  well as pertinent plan - they recommend: admission   Disposition: After consideration of the diagnostic results and the patients response to treatment, I feel that patient would benefit from admission as discussed above .   I discussed this case with my attending physician Dr. Posey Rea who cosigned this note including patient's presenting symptoms, physical exam, and planned diagnostics and interventions. Attending physician stated agreement with plan or made changes to plan which were implemented.    Final Clinical Impression(s) / ED Diagnoses Final diagnoses:  None    Rx / DC Orders      Olene Floss, PA-C 11/26/21 0757    Glendora Score, MD 11/27/21 [phone]

## 2021-11-22 NOTE — Telephone Encounter (Signed)
Let's have her continue to hold propranolol and continue to monitor her HR and BP. Her HR is at goal right now without propranolol. Ideally want to keep her HR around 55-60 beats per minute.  If her HR becomes persistently elevated at 70 or above, we could consider adding propranolol back at 1/2 dose.

## 2021-11-23 ENCOUNTER — Telehealth: Payer: Self-pay | Admitting: Gastroenterology

## 2021-11-23 DIAGNOSIS — Z560 Unemployment, unspecified: Secondary | ICD-10-CM | POA: Diagnosis not present

## 2021-11-23 DIAGNOSIS — Z79899 Other long term (current) drug therapy: Secondary | ICD-10-CM | POA: Diagnosis not present

## 2021-11-23 DIAGNOSIS — K7682 Hepatic encephalopathy: Secondary | ICD-10-CM | POA: Diagnosis present

## 2021-11-23 DIAGNOSIS — K766 Portal hypertension: Secondary | ICD-10-CM | POA: Diagnosis present

## 2021-11-23 DIAGNOSIS — K7040 Alcoholic hepatic failure without coma: Secondary | ICD-10-CM | POA: Diagnosis present

## 2021-11-23 DIAGNOSIS — E876 Hypokalemia: Secondary | ICD-10-CM | POA: Diagnosis present

## 2021-11-23 DIAGNOSIS — R188 Other ascites: Secondary | ICD-10-CM

## 2021-11-23 DIAGNOSIS — D649 Anemia, unspecified: Secondary | ICD-10-CM | POA: Diagnosis present

## 2021-11-23 DIAGNOSIS — K219 Gastro-esophageal reflux disease without esophagitis: Secondary | ICD-10-CM | POA: Diagnosis present

## 2021-11-23 DIAGNOSIS — Z825 Family history of asthma and other chronic lower respiratory diseases: Secondary | ICD-10-CM | POA: Diagnosis not present

## 2021-11-23 DIAGNOSIS — F17200 Nicotine dependence, unspecified, uncomplicated: Secondary | ICD-10-CM | POA: Diagnosis not present

## 2021-11-23 DIAGNOSIS — E8809 Other disorders of plasma-protein metabolism, not elsewhere classified: Secondary | ICD-10-CM | POA: Diagnosis present

## 2021-11-23 DIAGNOSIS — Z86711 Personal history of pulmonary embolism: Secondary | ICD-10-CM | POA: Diagnosis not present

## 2021-11-23 DIAGNOSIS — D509 Iron deficiency anemia, unspecified: Secondary | ICD-10-CM | POA: Diagnosis present

## 2021-11-23 DIAGNOSIS — K7460 Unspecified cirrhosis of liver: Secondary | ICD-10-CM | POA: Diagnosis not present

## 2021-11-23 DIAGNOSIS — Z90710 Acquired absence of both cervix and uterus: Secondary | ICD-10-CM | POA: Diagnosis not present

## 2021-11-23 DIAGNOSIS — Z8249 Family history of ischemic heart disease and other diseases of the circulatory system: Secondary | ICD-10-CM | POA: Diagnosis not present

## 2021-11-23 DIAGNOSIS — Z23 Encounter for immunization: Secondary | ICD-10-CM | POA: Diagnosis not present

## 2021-11-23 DIAGNOSIS — K648 Other hemorrhoids: Secondary | ICD-10-CM | POA: Diagnosis present

## 2021-11-23 DIAGNOSIS — F17210 Nicotine dependence, cigarettes, uncomplicated: Secondary | ICD-10-CM | POA: Diagnosis present

## 2021-11-23 DIAGNOSIS — K7031 Alcoholic cirrhosis of liver with ascites: Secondary | ICD-10-CM | POA: Diagnosis present

## 2021-11-23 DIAGNOSIS — Z833 Family history of diabetes mellitus: Secondary | ICD-10-CM | POA: Diagnosis not present

## 2021-11-23 DIAGNOSIS — Z86718 Personal history of other venous thrombosis and embolism: Secondary | ICD-10-CM | POA: Diagnosis not present

## 2021-11-23 LAB — CBC WITH DIFFERENTIAL/PLATELET
Abs Immature Granulocytes: 0.2 10*3/uL — ABNORMAL HIGH (ref 0.00–0.07)
Band Neutrophils: 3 %
Basophils Absolute: 0 10*3/uL (ref 0.0–0.1)
Basophils Relative: 0 %
Eosinophils Absolute: 0.1 10*3/uL (ref 0.0–0.5)
Eosinophils Relative: 2 %
HCT: 23.8 % — ABNORMAL LOW (ref 36.0–46.0)
Hemoglobin: 7.8 g/dL — ABNORMAL LOW (ref 12.0–15.0)
Lymphocytes Relative: 39 %
Lymphs Abs: 1.5 10*3/uL (ref 0.7–4.0)
MCH: 29.8 pg (ref 26.0–34.0)
MCHC: 32.8 g/dL (ref 30.0–36.0)
MCV: 90.8 fL (ref 80.0–100.0)
Metamyelocytes Relative: 2 %
Monocytes Absolute: 0.1 10*3/uL (ref 0.1–1.0)
Monocytes Relative: 3 %
Myelocytes: 2 %
Neutro Abs: 2 10*3/uL (ref 1.7–7.7)
Neutrophils Relative %: 49 %
Platelets: 103 10*3/uL — ABNORMAL LOW (ref 150–400)
RBC: 2.62 MIL/uL — ABNORMAL LOW (ref 3.87–5.11)
RDW: 14.9 % (ref 11.5–15.5)
WBC: 3.9 10*3/uL — ABNORMAL LOW (ref 4.0–10.5)
nRBC: 0 % (ref 0.0–0.2)

## 2021-11-23 LAB — COMPREHENSIVE METABOLIC PANEL
ALT: 32 U/L (ref 0–44)
AST: 66 U/L — ABNORMAL HIGH (ref 15–41)
Albumin: 2.7 g/dL — ABNORMAL LOW (ref 3.5–5.0)
Alkaline Phosphatase: 66 U/L (ref 38–126)
Anion gap: 7 (ref 5–15)
BUN: 9 mg/dL (ref 6–20)
CO2: 25 mmol/L (ref 22–32)
Calcium: 8.4 mg/dL — ABNORMAL LOW (ref 8.9–10.3)
Chloride: 102 mmol/L (ref 98–111)
Creatinine, Ser: 0.82 mg/dL (ref 0.44–1.00)
GFR, Estimated: >60 mL/min (ref >60)
Glucose, Bld: 108 mg/dL — ABNORMAL HIGH (ref 70–99)
Potassium: 3.5 mmol/L (ref 3.5–5.1)
Sodium: 134 mmol/L — ABNORMAL LOW (ref 135–145)
Total Bilirubin: 2.9 mg/dL — ABNORMAL HIGH (ref 0.3–1.2)
Total Protein: 5.6 g/dL — ABNORMAL LOW (ref 6.5–8.1)

## 2021-11-23 LAB — RETICULOCYTES
Immature Retic Fract: 21.3 % — ABNORMAL HIGH (ref 2.3–15.9)
RBC.: 2.48 MIL/uL — ABNORMAL LOW (ref 3.87–5.11)
Retic Count, Absolute: 74.9 10*3/uL (ref 19.0–186.0)
Retic Ct Pct: 3 % (ref 0.4–3.1)

## 2021-11-23 LAB — MAGNESIUM: Magnesium: 2.1 mg/dL (ref 1.7–2.4)

## 2021-11-23 LAB — VITAMIN B12: Vitamin B-12: 740 pg/mL (ref 180–914)

## 2021-11-23 LAB — FERRITIN: Ferritin: 15 ng/mL (ref 11–307)

## 2021-11-23 LAB — TSH: TSH: 3.752 u[IU]/mL (ref 0.350–4.500)

## 2021-11-23 LAB — IRON AND TIBC
Iron: 19 ug/dL — ABNORMAL LOW (ref 28–170)
Saturation Ratios: 8 % — ABNORMAL LOW (ref 10.4–31.8)
TIBC: 243 ug/dL — ABNORMAL LOW (ref 250–450)
UIBC: 224 ug/dL

## 2021-11-23 LAB — FOLATE: Folate: 13.3 ng/mL (ref >5.9)

## 2021-11-23 MED ORDER — FUROSEMIDE 10 MG/ML IJ SOLN
40.0000 mg | Freq: Once | INTRAMUSCULAR | Status: AC
  Administered 2021-11-23: 40 mg via INTRAVENOUS
  Filled 2021-11-23: qty 4

## 2021-11-23 MED ORDER — OXYCODONE HCL 5 MG PO TABS
5.0000 mg | ORAL_TABLET | ORAL | Status: DC | PRN
  Administered 2021-11-23: 5 mg via ORAL
  Filled 2021-11-23: qty 1

## 2021-11-23 MED ORDER — IBUPROFEN 400 MG PO TABS: 400.0000 mg | ORAL_TABLET | Freq: Four times a day (QID) | ORAL | Status: DC | PRN

## 2021-11-23 MED ORDER — OXYCODONE HCL 5 MG PO TABS
5.0000 mg | ORAL_TABLET | Freq: Four times a day (QID) | ORAL | Status: DC | PRN
  Administered 2021-11-23 – 2021-11-24 (×3): 5 mg via ORAL
  Filled 2021-11-23 (×5): qty 1

## 2021-11-23 MED ORDER — METHOCARBAMOL 1000 MG/10ML IJ SOLN
INTRAMUSCULAR | Status: AC
  Filled 2021-11-23: qty 10

## 2021-11-23 MED ORDER — SODIUM CHLORIDE 0.9 % IV SOLN
500.0000 mg | Freq: Once | INTRAVENOUS | Status: AC
  Administered 2021-11-23: 500 mg via INTRAVENOUS
  Filled 2021-11-23: qty 25

## 2021-11-23 MED ORDER — FUROSEMIDE 10 MG/ML IJ SOLN
20.0000 mg | Freq: Once | INTRAMUSCULAR | Status: AC
  Administered 2021-11-23: 20 mg via INTRAVENOUS
  Filled 2021-11-23: qty 2

## 2021-11-23 MED ORDER — PANTOPRAZOLE SODIUM 40 MG PO TBEC
40.0000 mg | DELAYED_RELEASE_TABLET | Freq: Two times a day (BID) | ORAL | Status: DC
  Administered 2021-11-23 – 2021-11-24 (×3): 40 mg via ORAL
  Filled 2021-11-23 (×4): qty 1

## 2021-11-23 MED ORDER — POTASSIUM CHLORIDE CRYS ER 20 MEQ PO TBCR
40.0000 meq | EXTENDED_RELEASE_TABLET | Freq: Once | ORAL | Status: AC
  Administered 2021-11-23: 40 meq via ORAL
  Filled 2021-11-23: qty 2

## 2021-11-23 MED ORDER — SODIUM CHLORIDE 0.9% IV SOLUTION: Freq: Once | INTRAVENOUS | Status: DC

## 2021-11-23 MED ORDER — ONDANSETRON HCL 4 MG PO TABS
4.0000 mg | ORAL_TABLET | Freq: Four times a day (QID) | ORAL | Status: DC | PRN
  Administered 2021-11-24: 4 mg via ORAL
  Filled 2021-11-23: qty 1

## 2021-11-23 MED ORDER — ONDANSETRON HCL 4 MG/2ML IJ SOLN: 4.0000 mg | Freq: Four times a day (QID) | INTRAMUSCULAR | Status: DC | PRN

## 2021-11-23 MED ORDER — HYDROCORTISONE (PERIANAL) 2.5 % EX CREA
1.0000 | TOPICAL_CREAM | Freq: Two times a day (BID) | CUTANEOUS | Status: DC
  Administered 2021-11-23 – 2021-11-24 (×2): 1 via RECTAL
  Filled 2021-11-23: qty 28.35

## 2021-11-23 MED ORDER — LACTULOSE 10 GM/15ML PO SOLN
20.0000 g | Freq: Three times a day (TID) | ORAL | Status: DC
  Administered 2021-11-23 – 2021-11-24 (×5): 20 g via ORAL
  Filled 2021-11-23 (×5): qty 30

## 2021-11-23 MED ORDER — SPIRONOLACTONE 25 MG PO TABS
100.0000 mg | ORAL_TABLET | Freq: Every day | ORAL | Status: DC
  Administered 2021-11-23 – 2021-11-24 (×2): 100 mg via ORAL
  Filled 2021-11-23 (×2): qty 4

## 2021-11-23 MED ORDER — PNEUMOCOCCAL VAC POLYVALENT 25 MCG/0.5ML IJ INJ
0.5000 mL | INJECTION | INTRAMUSCULAR | Status: AC
  Administered 2021-11-24: 0.5 mL via INTRAMUSCULAR
  Filled 2021-11-23 (×2): qty 0.5

## 2021-11-23 MED ORDER — PROPRANOLOL HCL 20 MG PO TABS
10.0000 mg | ORAL_TABLET | Freq: Every day | ORAL | Status: DC
  Filled 2021-11-23: qty 1

## 2021-11-23 MED ORDER — FAMOTIDINE 20 MG PO TABS
40.0000 mg | ORAL_TABLET | Freq: Every day | ORAL | Status: DC
  Administered 2021-11-23: 40 mg via ORAL
  Filled 2021-11-23: qty 2

## 2021-11-23 MED ORDER — GABAPENTIN 300 MG PO CAPS
300.0000 mg | ORAL_CAPSULE | Freq: Three times a day (TID) | ORAL | Status: DC | PRN
  Administered 2021-11-23 – 2021-11-24 (×2): 300 mg via ORAL
  Filled 2021-11-23 (×2): qty 1

## 2021-11-23 MED ORDER — METHOCARBAMOL 1000 MG/10ML IJ SOLN
500.0000 mg | Freq: Two times a day (BID) | INTRAVENOUS | Status: DC | PRN
  Administered 2021-11-23 – 2021-11-24 (×2): 500 mg via INTRAVENOUS
  Filled 2021-11-23 (×2): qty 5

## 2021-11-23 MED ORDER — MORPHINE SULFATE (PF) 2 MG/ML IV SOLN: 2.0000 mg | INTRAVENOUS | Status: DC | PRN

## 2021-11-23 NOTE — Assessment & Plan Note (Addendum)
-  mild Ascites present on examination; there was no significant tenderness or tension. -Case discussed with gastroenterology service who has recommended low-sodium diet and adjusted doses of spironolactone and Lasix -Outpatient follow-up for potential paracentesis and further management. -Patient advised to maintain adequate hydration.

## 2021-11-23 NOTE — Progress Notes (Signed)
Patient seen and examined; admitted after midnight secondary to symptomatic anemia.  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  Patient reports since on intermittent bright red blood per rectum and also increased shortness of breath with activity, palpitations and generalized weakness.  Hemoglobin down to 7.5 at time of admission from 9.3 3 months ago.  Please refer to H&P written by Dr. Clearence Ped on 11/23/21 for further info/details on admission.  Plan -Provide IV iron x1 -Transfuse 1 more unit of PRBCs -Follow hemoglobin trend -Follow recommendation by GI service (which has been consulted). -hopefully home in the next 24 hours if not anticipated procedures are plan and patient remains hemodynamically stable.   Barton Dubois MD [phone]

## 2021-11-23 NOTE — Assessment & Plan Note (Signed)
-  Smokes half a pack per day - Declined nicotine patch  -Cessation counseling provided

## 2021-11-23 NOTE — Assessment & Plan Note (Signed)
Patient's potassium is 3.1 - She was given 40 mEq potassium in the ED - Trend in the a.m. along with magnesium

## 2021-11-23 NOTE — H&P (Signed)
History and Physical    Patient: Erica Banks:[id number] DOB: October 25, 1980 DOA: 11/22/2021 DOS: the patient was seen and examined on 11/23/2021 PCP: Neale Burly, MD  Patient coming from: Home  Chief Complaint:  Chief Complaint  Patient presents with   Rectal Bleeding   Shortness of Breath   Chest Pain   HPI: Erica Banks is a 41 y.o. female with medical history significant of anemia, cirrhosis, DVT, neuropathy, pulmonary embolism, and more presents the ED with a chief complaint of dyspnea.  Patient reports that she has had chronic anemia since 2017.  She has been getting iron infusions at the cancer center in [place].  Her last iron infusion was 2 months ago.  Her last blood transfusion was in March.  Patient reports that she started feeling badly over the weekend, approximately 6 days ago.  She reports she has had generalized weakness, dyspnea, chest pain, palpitations at work gradual in onset.  She has not had any dizziness or syncope.  Patient is not sure if her dyspnea is exertional but she does not think so.  When her palpitations are there they last about 1 minute.  Patient reports this feels the same as her previous episodes of acute on chronic anemia.  On review of systems patient admits to chronic nausea without vomiting.  Patient does complain about bleeding hemorrhoids which so far the only source of acute anemia that can be found patient has not had any further complaints at this time.  Apparently in the ER patient was complaining about muscle cramping in both legs.  She reports that since her electrolytes have been repleted she has not had a complaint.  Review of Systems: As mentioned in the history of present illness. All other systems reviewed and are negative. Past Medical History:  Diagnosis Date   Anemia    Cirrhosis ([place])    DVT (deep venous thrombosis) ([facility])    Low iron    Neuropathy    Neuropathy    Panic attacks    Pulmonary embolus ([place]) 2019   Past  Surgical History:  Procedure Laterality Date   ABDOMINAL HYSTERECTOMY     BIOPSY N/A 06/24/2013   Procedure: BIOPSY TERMINAL ILEUM;  Surgeon: Daneil Dolin, MD;  Location: [facility];  Service: Endoscopy;  Laterality: N/A;   CHOLECYSTECTOMY     COLONOSCOPY N/A 06/24/2013   CHY:IFOYDXAJ hemorrhoids/otherwise normal    COLONOSCOPY WITH PROPOFOL N/A 05/31/2021   Surgeon: Daneil Dolin, MD; ery large nonbleeding internal and external hemorrhoids, portal colopathy.  Recommended repeat colonoscopy in 10 years.   ESOPHAGOGASTRODUODENOSCOPY (EGD) WITH PROPOFOL N/A 03/29/2021   Surgeon: Eloise Harman, DO;  grade 1 esophageal varices, portal hypertensive gastropathy with friable tissue with spontaneous ooze, normal duodenum.   ESOPHAGOGASTRODUODENOSCOPY (EGD) WITH PROPOFOL N/A 05/31/2021   Surgeon: Daneil Dolin, MD;  grade 1-2 esophageal varices without bleeding stigmata, advanced appearing portal hypertensive gastropathy.   nasal bone surgery     NASAL SINUS SURGERY     SINUS IRRIGATION     TONSILLECTOMY     TUBAL LIGATION     Social History:  reports that she has been smoking cigarettes. She has a 6.00 pack-year smoking history. She has never used smokeless tobacco. She reports that she does not currently use alcohol after a past usage of about 4.0 - 5.0 standard drinks of alcohol per week. She reports current drug use. Drug: Marijuana.  Allergies  Allergen Reactions   Demerol Anaphylaxis  Per patient cardiac arrest    Family History  Problem Relation Age of Onset   Diabetes Mother    Hypertension Mother    COPD Mother    Cirrhosis Father        ETOH   Diabetes Sister    Hypertension Sister    Crohn's disease Maternal Grandmother    Cancer Other    Colon cancer Neg Hx    Autoimmune disease Neg Hx     Prior to Admission medications   Medication Sig Start Date End Date Taking? Authorizing Provider  CONSTULOSE 10 GM/15ML solution Take 20 g by mouth 3 (three) times  daily. 10/16/21   [provider]  famotidine (PEPCID) 40 MG tablet Take 1 tablet (40 mg total) by mouth at bedtime. 10/31/21   Erenest Rasher, PA-C  furosemide (LASIX) 40 MG tablet Take 1 tablet (40 mg total) by mouth daily. 08/24/21   Erenest Rasher, PA-C  gabapentin (NEURONTIN) 300 MG capsule Take 300 mg by mouth 3 (three) times daily as needed (nerve pain). 02/25/21   [provider]  hydrocortisone (ANUSOL-HC) 2.5 % rectal cream Place 1 Application rectally 2 (two) times daily. 10/31/21   Erenest Rasher, PA-C  ondansetron (ZOFRAN) 4 MG tablet Take 1 tablet (4 mg total) by mouth every 8 (eight) hours as needed for nausea or vomiting. 08/24/21 08/24/22  Erenest Rasher, PA-C  pantoprazole (PROTONIX) 40 MG tablet Take 1 tablet (40 mg total) by mouth 2 (two) times daily before a meal. 03/30/21   Kathie Dike, MD  propranolol (INDERAL) 10 MG tablet Take 1 tablet (10 mg total) by mouth 2 (two) times daily. Patient taking differently: Take 10 mg by mouth daily. 08/23/21   Erenest Rasher, PA-C  spironolactone (ALDACTONE) 100 MG tablet Take 1 tablet (100 mg total) by mouth daily. 08/24/21   Erenest Rasher, PA-C    Physical Exam: Vitals:   11/23/21 0134 11/23/21 0239 11/23/21 0300 11/23/21 0402  BP: (!) 104/52 (!) 94/54 (!) 97/49 95/84  Pulse: 76 82 72 77  Resp: 18 19 14 16   Temp: 98.1 F (36.7 C) 98.3 F (36.8 C)  98.1 F (36.7 C)  TempSrc:  Oral  Oral  SpO2: 98% 99% 97% 100%  Weight:    69 kg  Height:       1.  General: Patient lying supine in bed,  no acute distress   2. Psychiatric: Alert and oriented x 3, mood and behavior normal for situation, pleasant and cooperative with exam   3. Neurologic: Speech and language are normal, face is symmetric, moves all 4 extremities voluntarily, at baseline without acute deficits on limited exam   4. HEENMT:  Head is atraumatic, normocephalic, pupils reactive to light, neck is supple, trachea is midline, mucous  membranes are moist   5. Respiratory : Lungs are clear to auscultation bilaterally without wheezing, rhonchi, rales, no cyanosis, no increase in work of breathing or accessory muscle use   6. Cardiovascular : Heart rate normal, rhythm is regular, no murmurs, rubs or gallops, no peripheral edema, peripheral pulses palpated   7. Gastrointestinal:  Abdomen is soft, distended with ascites, nontender to palpation bowel sounds active, hepatomegaly present   8. Skin:  Skin is warm, dry and intact without rashes, acute lesions, or ulcers on limited exam   9.Musculoskeletal:  No acute deformities or trauma, no asymmetry in tone, no peripheral edema, peripheral pulses palpated, no tenderness to palpation in the extremities  Data Reviewed:  In the ED Temp 98.1, heart rate 88-92, respiratory rate 17-20, blood pressure 109/63-134/75, satting 98-100% No leukocytosis with a white blood cell count of 4.6, hemoglobin 7.5, platelets 113 Hemoglobin has come down a full gram in the last 2 weeks. Hypokalemia 3.1, hypoalbuminemia 2.9,  EKG shows heart rate 91, QTc 469, sinus rhythm Negative fecal occult blood test Chest x-ray shows minimal pleural effusion negative for pneumonia Admission requested for symptomatic anemia  Assessment and Plan: * Symptomatic anemia - Patient's hemoglobin is dropped to 7.5 in 2 weeks - FOBT negative - Follows with a hematologist already and gets iron transfusions and eating - Check anemia panel - Thyroid disease likely contributing - Continue to monitor  Tobacco use disorder - Smokes half a pack per day - Declines nicotine patch at this time  Hypoalbuminemia - Albumin 2.9 - Likely related to liver failure and poor p.o. intake - Encourage nutrient dense food intake - Trend CMP in the a.m.  Hypocalcemia Calcium corrects to 8.8 for albumin - 2 g calcium given in the ED - Patient was apparently symptomatic with leg cramping in the ED - No leg cramping at the  time of exam - Trend in the a.m.  Cirrhosis of liver with ascites ([facility]CC) - Ascites present today with abdominal tenderness - IV Lasix x1 - Consider paracentesis if there is not a adequate response to Lasix - Hold hepatotoxic agents when possible - Continue spironolactone - Continue to monitor  Hypokalemia Patient's potassium is 3.1 - She was given 40 mEq potassium in the ED - Trend in the a.m. along with magnesium  Gastroesophageal reflux disease - Continue Protonix      Advance Care Planning:   Code Status: Full Code   Consults: Gastro  Family Communication: No family at bedside  Severity of Illness: The appropriate patient status for this patient is OBSERVATION. Observation status is judged to be reasonable and necessary in order to provide the required intensity of service to ensure the patient's safety. The patient's presenting symptoms, physical exam findings, and initial radiographic and laboratory data in the context of their medical condition is felt to place them at decreased risk for further clinical deterioration. Furthermore, it is anticipated that the patient will be medically stable for discharge from the hospital within 2 midnights of admission.   Author: Rolla Plate, DO 11/23/2021 5:14 AM  For on call review [email or web address].

## 2021-11-23 NOTE — Assessment & Plan Note (Signed)
-   Albumin 2.9 - Likely related to liver failure -Continue to follow levels as an outpatient -Continue follow-up with gastroenterology.

## 2021-11-23 NOTE — Assessment & Plan Note (Signed)
Calcium corrects to 8.8 for albumin - 2 g calcium given in the ED - Patient was apparently symptomatic with leg cramping in the ED - No leg cramping at the time of exam - Trend in the a.m.

## 2021-11-23 NOTE — Assessment & Plan Note (Addendum)
-  Continue Protonix twice a day and Pepcid nightly. -Lifestyle changes discussed with patient.

## 2021-11-23 NOTE — Progress Notes (Signed)
  Transition of Care [facility]) Screening Note   Patient Details  Name: Erica Banks Date of Birth: 02/08/1980   Transition of Care [facility]) CM/SW Contact:    Iona Beard, [place] Phone Number: 11/23/2021, 11:05 AM    Transition of Care Department [facility]) has reviewed patient and no TOC needs have been identified at this time. We will continue to monitor patient advancement through interdisciplinary progression rounds. If new patient transition needs arise, please place a TOC consult.

## 2021-11-23 NOTE — Telephone Encounter (Signed)
Please make hospital follow-up for patient in 3-4 weeks with any APP  Venetia Night, MSN, APRN, FNP-BC, AGACNP-BC [facility] Gastroenterology at [facility]

## 2021-11-23 NOTE — Consult Note (Signed)
Gastroenterology Consult   Referring Provider: No ref. provider found Primary Care Physician:  Toma Deiters, MD Primary Gastroenterologist:  Dr.  Patient ID: [facility]; [id number]; [birth year]   Admit date: 11/22/2021  LOS: 0 days   Date of Consultation: 11/23/2021  Reason for Consultation:  symptomatic anemia  History of Present Illness   Naydeen NYIESHA BEEVER is a 41 y.o. year old female with history of alcohol abuse, chronic anemia receiving iron infusions per hematology, anxiety, decompensated cirrhosis complicated by ascites and previous positive Gram stain with culture not consistent with SBP in March 2023, pH esophageal varices, coagulopathy, PE and DVT who presented to the ED with worsening dyspnea, weakness, heart palpitations.  Patient reported about 6 days increased weakness, chest pains, dyspnea without syncope, muscle cramping.  Hemoglobin found to be 7.5 which is a one-point drop from 10 days prior and a 4 g drop to the last month.  Hematology scheduled her for iron infusion in, however given patient's symptoms she was advised that she felt bad enough that she should go to the ED for further evaluation.   ED Course: Vital signs stable. WBC 4.6, hemoglobin 7.5, platelets 113, potassium, albumin 2.9, negative stool Hemoccult CXR with minimal pleural effusion negative for pneumonia EKG with normal sinus rhythm   Consult: Within the past year or 2 her anemia is much improved than prior. Usually gets an iron infusion about every 3 months. Had PICA that began about 2 days ago, also gets the urge to drink vinegar and things as well. Had an appointment this morning to get iron this morning. Does have bleeding from her hemorrhoids about once every 2 months. Usually flared by her constipation. Denies any melena at home. Has chronic nausea but no vomiting. Started feeling short of breath 2 days ago along with chest heaviness and feels heart pounding. Taking lactulose 30 ml once  per day due to excessive gas and has 1-2 BM day. Stools are soft and not needing to strain. Thinks within the last 2 weeks she had a little trouble going and it flared her hemorrhoids some and did have 2-3 days of heavy BRBPR from her hemorrhoids and then it cleared up. Had a BM this morning without any blood. Taking pantoprazole twice daily. Reports her weight was up on admission about 10 lbs to what it is at home. Has had good diuresis from lasix thus far. Last drink was 03/13/21.  EGD and colonoscopy in May 2023 with grade 1-2 esophageal varices without bleeding, advanced appearing portal hypertensive gastropathy. Colonoscopy with very large internal and external hemorrhoids, portal colopathy. Repeat recommended in 3 years.   On propranolol for variceal prophylaxis. Lactulose for mild HE (recently increased to 43ml BID for goal 2-3 BM daily). Lasix 40 and spironolactone 100 for diuresis/ascites management.  Stable hgb at 11.7, iron 113, saturation 61%, ferritin 93.3 on September 2023  Most recent labs 11/14/21 with Hgb 8.5, Iron 48, saturation 23%, ferritin 33.9 (did not receive transfusion), presented to the ED for evaluation   Past Medical History:  Diagnosis Date   Anemia    Cirrhosis (HCC)    DVT (deep venous thrombosis) (HCC)    Low iron    Neuropathy    Neuropathy    Panic attacks    Pulmonary embolus (HCC) 2019    Past Surgical History:  Procedure Laterality Date   ABDOMINAL HYSTERECTOMY     BIOPSY N/A 06/24/2013   Procedure: BIOPSY TERMINAL ILEUM;  Surgeon: Corbin Ade, MD;  Location: [facility];  Service: Endoscopy;  Laterality: N/A;   CHOLECYSTECTOMY     COLONOSCOPY N/A 06/24/2013   [id number] hemorrhoids/otherwise normal    COLONOSCOPY WITH PROPOFOL N/A 05/31/2021   Surgeon: Corbin Ade, MD; ery large nonbleeding internal and external hemorrhoids, portal colopathy.  Recommended repeat colonoscopy in 10 years.   ESOPHAGOGASTRODUODENOSCOPY (EGD) WITH PROPOFOL  N/A 03/29/2021   Surgeon: Lanelle Bal, DO;  grade 1 esophageal varices, portal hypertensive gastropathy with friable tissue with spontaneous ooze, normal duodenum.   ESOPHAGOGASTRODUODENOSCOPY (EGD) WITH PROPOFOL N/A 05/31/2021   Surgeon: Corbin Ade, MD;  grade 1-2 esophageal varices without bleeding stigmata, advanced appearing portal hypertensive gastropathy.   nasal bone surgery     NASAL SINUS SURGERY     SINUS IRRIGATION     TONSILLECTOMY     TUBAL LIGATION      Prior to Admission medications   Medication Sig Start Date End Date Taking? Authorizing Provider  furosemide (LASIX) 40 MG tablet Take 1 tablet (40 mg total) by mouth daily. 08/24/21  Yes Ermalinda Memos S, PA-C  gabapentin (NEURONTIN) 300 MG capsule Take 300 mg by mouth 3 (three) times daily as needed (nerve pain). 02/25/21  Yes [provider]  hydrOXYzine (VISTARIL) 25 MG capsule Take 25 mg by mouth 3 (three) times daily as needed for anxiety. 11/13/21  Yes [provider]  ondansetron (ZOFRAN) 4 MG tablet Take 1 tablet (4 mg total) by mouth every 8 (eight) hours as needed for nausea or vomiting. 08/24/21 08/24/22 Yes Clearance Coots, Gwendalyn Ege, PA-C  pantoprazole (PROTONIX) 40 MG tablet Take 1 tablet (40 mg total) by mouth 2 (two) times daily before a meal. 03/30/21  Yes Erick Blinks, MD  spironolactone (ALDACTONE) 100 MG tablet Take 1 tablet (100 mg total) by mouth daily. 08/24/21  Yes Harper, Kristen S, PA-C  CONSTULOSE 10 GM/15ML solution Take 20 g by mouth 3 (three) times daily. Patient not taking: Reported on 11/23/2021 10/16/21   [provider]  famotidine (PEPCID) 40 MG tablet Take 1 tablet (40 mg total) by mouth at bedtime. Patient not taking: Reported on 11/23/2021 10/31/21   Letta Median, PA-C  hydrocortisone (ANUSOL-HC) 2.5 % rectal cream Place 1 Application rectally 2 (two) times daily. 10/31/21   Letta Median, PA-C  propranolol (INDERAL) 10 MG tablet Take 1 tablet (10 mg total) by  mouth 2 (two) times daily. Patient not taking: Reported on 11/23/2021 08/23/21   Letta Median, PA-C    Current Facility-Administered Medications  Medication Dose Route Frequency Provider Last Rate Last Admin   0.9 %  sodium chloride infusion (Manually program via Guardrails IV Fluids)   Intravenous Once Vassie Loll, MD       0.9 %  sodium chloride infusion  10 mL/hr Intravenous Once Zierle-Ghosh, Asia B, DO       famotidine (PEPCID) tablet 40 mg  40 mg Oral QHS Zierle-Ghosh, Asia B, DO       furosemide (LASIX) injection 20 mg  20 mg Intravenous Once Vassie Loll, MD       gabapentin (NEURONTIN) capsule 300 mg  300 mg Oral TID PRN Zierle-Ghosh, Asia B, DO       hydrocortisone (ANUSOL-HC) 2.5 % rectal cream 1 Application  1 Application Rectal BID Zierle-Ghosh, Asia B, DO       iron sucrose (VENOFER) 500 mg in sodium chloride 0.9 % 250 mL IVPB  500 mg Intravenous Once Vassie Loll, MD  lactulose (CHRONULAC) 10 GM/15ML solution 20 g  20 g Oral TID Zierle-Ghosh, Asia B, DO       ondansetron (ZOFRAN) tablet 4 mg  4 mg Oral Q6H PRN Zierle-Ghosh, Asia B, DO       Or   ondansetron (ZOFRAN) injection 4 mg  4 mg Intravenous Q6H PRN Zierle-Ghosh, Asia B, DO       oxyCODONE (Oxy IR/ROXICODONE) immediate release tablet 5 mg  5 mg Oral Q6H PRN Barton Dubois, MD       pantoprazole (PROTONIX) EC tablet 40 mg  40 mg Oral BID AC Zierle-Ghosh, Asia B, DO       [START ON 11/24/2021] pneumococcal 23 valent vaccine (PNEUMOVAX-23) injection 0.5 mL  0.5 mL Intramuscular Tomorrow-1000 Zierle-Ghosh, Asia B, DO       propranolol (INDERAL) tablet 10 mg  10 mg Oral Daily Zierle-Ghosh, Asia B, DO       spironolactone (ALDACTONE) tablet 100 mg  100 mg Oral Daily Zierle-Ghosh, Asia B, DO        Allergies as of 11/22/2021 - Review Complete 11/22/2021  Allergen Reaction Noted   Demerol Anaphylaxis 10/12/2010    Family History  Problem Relation Age of Onset   Diabetes Mother    Hypertension Mother     COPD Mother    Cirrhosis Father        ETOH   Diabetes Sister    Hypertension Sister    Crohn's disease Maternal Grandmother    Cancer Other    Colon cancer Neg Hx    Autoimmune disease Neg Hx     Social History   Socioeconomic History   Marital status: Married    Spouse name: Not on file   Number of children: Not on file   Years of education: Not on file   Highest education level: Not on file  Occupational History   Occupation: Unemployed    Comment: wants to go to nursing school in fall 2015  Tobacco Use   Smoking status: Every Day    Packs/day: 0.50    Years: 12.00    Total pack years: 6.00    Types: Cigarettes   Smokeless tobacco: Never  Vaping Use   Vaping Use: Never used  Substance and Sexual Activity   Alcohol use: Not Currently    Alcohol/week: 4.0 - 5.0 standard drinks of alcohol    Types: 4 - 5 Cans of beer per week    Comment: None since February 2023.   Drug use: Yes    Types: Marijuana    Comment: occasional   Sexual activity: Yes    Birth control/protection: Surgical  Other Topics Concern   Not on file  Social History Narrative   Not on file   Social Determinants of Health   Financial Resource Strain: Not on file  Food Insecurity: No Food Insecurity (11/23/2021)   Hunger Vital Sign    Worried About Running Out of Food in the Last Year: Never true    Ran Out of Food in the Last Year: Never true  Transportation Needs: No Transportation Needs (11/23/2021)   PRAPARE - Hydrologist (Medical): No    Lack of Transportation (Non-Medical): No  Physical Activity: Not on file  Stress: Not on file  Social Connections: Not on file  Intimate Partner Violence: Not At Risk (11/23/2021)   Humiliation, Afraid, Rape, and Kick questionnaire    Fear of Current or Ex-Partner: No    Emotionally Abused: No  Physically Abused: No    Sexually Abused: No     Review of Systems   Gen: Denies any fever, chills, loss of appetite,  change in weight or weight loss CV: Denies chest pain, heart palpitations, syncope, edema  Resp: Denies shortness of breath with rest, cough, wheezing, coughing up blood, and pleurisy. GI: see HPI GU : Denies urinary burning, blood in urine, urinary frequency, and urinary incontinence. MS: Denies joint pain, limitation of movement, swelling, cramps, and atrophy.  Derm: Denies rash, itching, dry skin, hives. Psych: Denies depression, anxiety, memory loss, hallucinations, and confusion. Heme: Denies bruising or bleeding Neuro:  Denies any headaches, dizziness, paresthesias, shaking  Physical Exam   Vital Signs in last 24 hours: Temp:  [97.9 F (36.6 C)-98.3 F (36.8 C)] 98.1 F (36.7 C) (10/27 0402) Pulse Rate:  [72-92] 77 (10/27 0402) Resp:  [14-20] 16 (10/27 0402) BP: (94-134)/(49-84) 95/84 (10/27 0402) SpO2:  [96 %-100 %] 100 % (10/27 0402) Weight:  [66.7 kg-69 kg] 69 kg (10/27 0402) Last BM Date : 11/21/21  General:   Alert,  Well-developed, well-nourished, pleasant and cooperative in NAD Head:  Normocephalic and atraumatic. Eyes:  Sclera clear, no icterus.   Conjunctiva pink. Ears:  Normal auditory acuity. Mouth:  No deformity or lesions, dentition normal. Neck:  Supple; no masses Lungs:  Clear throughout to auscultation.   No wheezes, crackles, or rhonchi. No acute distress. Heart:  Regular rate and rhythm; no murmurs, clicks, rubs,  or gallops. Abdomen:  Soft, nontender and nondistended. Mildly fluid filled. Mild hepatomegaly, able to feel left lobe in th epigastric space. Normal bowel sounds, without guarding, and without rebound.   Rectal: deferred Msk:  Symmetrical without gross deformities. Normal posture. Extremities:  Without clubbing or edema. Neurologic:  Alert and  oriented x4. Skin:  Intact without significant lesions or rashes. Psych:  Alert and cooperative. Normal mood and affect.  Intake/Output from previous day: 10/26 0701 - 10/27 0700 In: 83.2 [IV  Piggyback:83.2] Out: -  Intake/Output this shift: No intake/output data recorded.   Labs/Studies   Recent Labs Recent Labs    11/22/21 2043 11/23/21 0421  WBC 4.6 3.9*  HGB 7.5* 7.8*  HCT 22.4* 23.8*  PLT 113* 103*   BMET Recent Labs    11/22/21 2043 11/23/21 0421  NA 131* 134*  K 3.1* 3.5  CL 99 102  CO2 26 25  GLUCOSE 103* 108*  BUN 10 9  CREATININE 0.94 0.82  CALCIUM 7.9* 8.4*   LFT Recent Labs    11/22/21 2043 11/23/21 0421  PROT 6.3* 5.6*  ALBUMIN 2.9* 2.7*  AST 75* 66*  ALT 35 32  ALKPHOS 67 66  BILITOT 3.4* 2.9*   PT/INR Recent Labs    11/22/21 2043  LABPROT 20.5*  INR 1.8*   Hepatitis Panel No results for input(s): "HEPBSAG", "HCVAB", "HEPAIGM", "HEPBIGM" in the last 72 hours. C-Diff No results for input(s): "CDIFFTOX" in the last 72 hours.  Radiology/Studies DG Chest 2 View  Result Date: 11/22/2021 CLINICAL DATA:  Chest pain EXAM: CHEST - 2 VIEW COMPARISON:  Chest 12/30/2016 FINDINGS: Heart size and vascularity normal. Lungs clear without infiltrate or pneumonia. Minimal pleural effusion on the lateral view not seen previously. IMPRESSION: Minimal pleural effusion.  Negative for pneumonia Electronically Signed   By: Marlan Palau M.D.   On: 11/22/2021 20:37     Assessment   Kenna TERSEA AULDS is a 41 y.o. year old female with history of alcohol abuse, chronic anemia receiving iron  infusions per hematology, anxiety, decompensated cirrhosis complicated by ascites and previous positive Gram stain with culture not consistent with SBP in March 2023, pH esophageal varices, coagulopathy, PE and DVT who presented to the ED with worsening dyspnea, weakness, heart palpitations.  Patient reported about 6 days increased weakness, chest pains, dyspnea without syncope, muscle cramping.  Hemoglobin found to be 7.5 which is a one-point drop from 10 days prior and a 4 g drop to the last month.  Hematology scheduled her for iron infusion in, however given  patient's symptoms she was advised that she felt bad enough that she should go to the ED for further evaluation.  Symptomatic Anemia: Chronic anemia.  Originally diagnosed in 2017 and has been following with hematology.  Reportedly receives IV iron infusions about every 3 months.  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  She has had endoscopic evaluation with EGD x2 and colonoscopy with most recent procedures being in May 2023 which revealed grade 1/2 esophageal varices without bleeding, advanced portal hypertensive gastropathy, portal colopathy, and very large nonbleeding internal/external hemorrhoids.  Some of her symptoms may be exacerbated by her bleeding hemorrhoids.  Hemoglobin was 11.7 with iron 113, saturation 61%, and ferritin 93 in September 2023.  Since this time.  She has had a drop in her hemoglobin.  11/14/2021 her hemoglobin was 8.5, iron 48, saturation 23%, normal ferritin.  She had contacted hematology regarding receiving an iron infusion and was to have an appointment today given her symptoms for the last couple of weeks.  About 2 days ago she began experiencing increased shortness of breath, pica, and heart palpitations and was advised to go to the ED for further evaluation.  Her hemoglobin on arrival was 7.5.  She was admitted and is receiving 1 unit of blood and IV iron today.  She denies any melena, BRBPR, abdominal pain, or hematemesis.  Only reported an episode of 2 days of rectal bleeding with a hemorrhoid flare about 2 weeks ago.  No need for endoscopic evaluation at this time, she will follow-up in the clinic in a few weeks.  Recommend CBC in 1 week after discharge.  She should also follow-up with hematology.  Cirrhosis: MELD in July was 16.  Bilirubin 2.9 at this time and stable from recent labs.  She has been following with transplant hepatology with [facility] in [place].   Etiology likely chronic alcohol use.  Extensive work-up has been unrevealing.  Has remained abstinent from alcohol since March 13, 2021.  She has had complications from her cirrhosis with ascites, coagulopathy, PHG, and grade 1-2 esophageal varices revealed on her most recent EGD in May 2023.  She went paracentesis in February 2023.  Has also experienced some mild hepatic encephalopathy and is currently being maintained on lactulose, taking 30 mL once daily with 1 bowel movement daily.  She is been maintained on Lasix 40 mg daily and spironolactone 100 mg daily for fluid management and on propanolol 10 mg daily for esophageal variceal prophylaxis.  She is immune to hepatitis a and has started hepatitis B vaccination.  Reported a recent 10 pound weight gain, received an extra dose of Lasix while inpatient and has had good response.  We discussed continuing to monitor her weight and notify the office for any significant weight increases.   Plan / Recommendations   Agree with IV iron and blood transfusion Continue to monitor H/H, transfuse hemoglobin less than  7 No need for repeat procedures at this time. Outpatient follow up in 3-4 weeks.      11/23/2021, 8:52 AM  Brooke Bonito, MSN, FNP-BC, AGACNP-BC [facility]

## 2021-11-23 NOTE — Progress Notes (Signed)
Assumed care of this patient at 1500. Pt has been ambulatory in room without assistance,  took shower after supper tonight. Tolerating po food/fluids without n/v. Pt's only complaint is that her abd has increased in size since this am. Abd is distended and tender to touch, bowel sounds active. (I am unable to quantify amount of increase in girth as I have no previous data to compare.) Pt states MD told her this am that she may need, "to have by stomach tapped to remove the fluid."  Pt tolerated IV iron infusion without difficulty or complaint.

## 2021-11-23 NOTE — Assessment & Plan Note (Signed)
-   Patient's hemoglobin is dropped to 7.5 in 2 weeks - FOBT negative - Follows with a hematologist already and gets iron transfusions and eating - Check anemia panel - Thyroid disease likely contributing - Continue to monitor

## 2021-11-24 DIAGNOSIS — K7031 Alcoholic cirrhosis of liver with ascites: Secondary | ICD-10-CM

## 2021-11-24 LAB — TYPE AND SCREEN
ABO/RH(D): O POS
Antibody Screen: NEGATIVE
Unit division: 0
Unit division: 0

## 2021-11-24 LAB — BPAM RBC
Blood Product Expiration Date: 202311202359
Blood Product Expiration Date: 202311262359
ISSUE DATE / TIME: 202310270110
ISSUE DATE / TIME: 202310270922
Unit Type and Rh: 5100
Unit Type and Rh: 5100

## 2021-11-24 NOTE — Progress Notes (Signed)
Patient states she feels much better after receiving iron.  Concerned about the abdominal distention and the need for tap  I have discussed with Dr. Dyann Kief  Vital signs in last 24 hours: Temp:  [97.7 F (36.5 C)-98.1 F (36.7 C)] 97.7 F (36.5 C) (10/28 1326) Pulse Rate:  [69-78] 69 (10/28 1326) BP: (115-125)/(65-71) 125/71 (10/28 1326) SpO2:  [99 %-100 %] 99 % (10/28 1326) Weight:  [68.7 kg] 68.7 kg (10/28 0500) Last BM Date : 11/24/21 General:   Alert,   pleasant and cooperative in NAD Abdomen: Full.  Not tense.  Positive bowel sounds, palpable spleen  soft, nontender.  Equivocal fluid wave Extremities:  Without clubbing or edema.    Intake/Output from previous day: 10/27 0701 - 10/28 0700 In: 1502.7 [P.O.:720; I.V.:500; IV Piggyback:282.7] Out: -  Intake/Output this shift: Total I/O In: 360 [P.O.:360] Out: -   Lab Results: Recent Labs    11/22/21 2043 11/23/21 0421  WBC 4.6 3.9*  HGB 7.5* 7.8*  HCT 22.4* 23.8*  PLT 113* 103*   BMET Recent Labs    11/22/21 2043 11/23/21 0421  NA 131* 134*  K 3.1* 3.5  CL 99 102  CO2 26 25  GLUCOSE 103* 108*  BUN 10 9  CREATININE 0.94 0.82  CALCIUM 7.9* 8.4*   LFT Recent Labs    11/23/21 0421  PROT 5.6*  ALBUMIN 2.7*  AST 66*  ALT 32  ALKPHOS 66  BILITOT 2.9*   PT/INR Recent Labs    11/22/21 2043  LABPROT 20.5*  INR 1.8*   Hepatitis Panel No results for input(s): "HEPBSAG", "HCVAB", "HEPAIGM", "HEPBIGM" in the last 72 hours. C-Diff No results for input(s): "CDIFFTOX" in the last 72 hours.  Studies/Results: DG Chest 2 View  Result Date: 11/22/2021 CLINICAL DATA:  Chest pain EXAM: CHEST - 2 VIEW COMPARISON:  Chest 12/30/2016 FINDINGS: Heart size and vascularity normal. Lungs clear without infiltrate or pneumonia. Minimal pleural effusion on the lateral view not seen previously. IMPRESSION: Minimal pleural effusion.  Negative for pneumonia Electronically Signed   By: Franchot Gallo M.D.   On: 11/22/2021  20:37     Impression: 41 year old lady with alcohol use disorder with secondary decompensated cirrhosis with portal hypertension and recurrent ascites admitted to the hospital with acute on chronic anemia in the setting of iron deficiency for which she has received iron infusions on a regular basis.  Missed her recent outpatient iron infusion. No active GI bleeding this admission.  Extensive GI work-up/evaluation as chronicled in the medical record.  Received her iron infusion this admission.  She is feeling much better.  Discussed with Dr. Dyann Kief -  patient interested in a paracentesis.  She is overall very stable at this time.  Paracentesis will not be accomplished over the weekend.  Recommendations: From a GI standpoint, she could be discharged today with close interval follow-up as is being arranged.  As far as potential paracentesis is concerned, I will arrange expedited ultrasound with paracentesis as feasible/appropriate the first of next week.  GI meds at discharge as follows:  Lactulose 20 g orally 3 times a day -titrate to 3 semiformed bowel movements daily.  Pantoprazole 40 mg orally twice daily before meals  Famotidine 40 mg at bedtime on as-needed basis for reflux.  Aldactone 100 mg daily  Furosemide 40 mg daily  Continue Inderal at an attenuated dose of 10 mg daily.  Zofran as needed.  2 g sodium diet

## 2021-11-24 NOTE — Discharge Summary (Signed)
Physician Discharge Summary   Patient: Erica [place] MRN: [id number] DOB: 1980-05-17  Admit date:     11/22/2021  Discharge date: 11/24/21  Discharge Physician: Barton [place]   PCP: Neale Burly, MD   Recommendations at discharge:  Repeat CBC to follow hemoglobin and platelets trend/stability. Repeat complete metabolic panel to follow electrolytes, liver function and renal function.  Discharge Diagnoses: Principal Problem:   Symptomatic anemia Active Problems:   Gastroesophageal reflux disease   Hypokalemia   Cirrhosis of liver with ascites (HCC)   Hypocalcemia   Hypoalbuminemia   Tobacco use disorder   Hospital Course: As per H&P written by Dr.Zierle-Ghgosh on 11/23/2021 Erica Banks is a 41 y.o. female with medical history significant of anemia, cirrhosis, DVT, neuropathy, pulmonary embolism, and more presents the ED with a chief complaint of dyspnea.  Patient reports that she has had chronic anemia since 2017.  She has been getting iron infusions at the cancer center in [place].  Her last iron infusion was 2 months ago.  Her last blood transfusion was in March.  Patient reports that she started feeling badly over the weekend, approximately 6 days ago.  She reports she has had generalized weakness, dyspnea, chest pain, palpitations at work gradual in onset.  She has not had any dizziness or syncope.  Patient is not sure if her dyspnea is exertional but she does not think so.  When her palpitations are there they last about 1 minute.  Patient reports this feels the same as her previous episodes of acute on chronic anemia.  On review of systems patient admits to chronic nausea without vomiting.  Patient does complain about bleeding hemorrhoids which so far the only source of acute anemia that can be found patient has not had any further complaints at this time.   Apparently in the ER patient was complaining about muscle cramping in both legs.  She reports that since her  electrolytes have been repleted she has not had a complaint.    Assessment and Plan: * Symptomatic anemia - Patient with history of chronic GI bleed in the setting of poor oral gastropathy, esophageal varices and internal hemorrhoids. - FOBT negative - Status post 2 units PRBCs and IV iron infusion throughout hospitalization -Hemoglobin stable around 7.8 at time of discharge. -No overt bleeding appreciated -Case discussed with gastroenterology service who has opted to set up outpatient follow-up for endoscopic evaluation. -Patient expressed feeling much better and asymptomatic at discharge. -Continue the use of PPI and Pepcid.   Tobacco use disorder -Smokes half a pack per day - Declined nicotine patch  -Cessation counseling provided  Hypoalbuminemia - Albumin 2.9 - Likely related to liver failure -Continue to follow levels as an outpatient -Continue follow-up with gastroenterology.  Hypocalcemia -Calcium corrects to 8.8 for albumin - 2 g calcium given -Continue to follow electrolytes trend -No abnormalities appreciated on telemetry.  Cirrhosis of liver with ascites (HCC) -mild Ascites present on examination; there was no significant tenderness or tension. -Case discussed with gastroenterology service who has recommended low-sodium diet and adjusted doses of spironolactone and Lasix -Outpatient follow-up for potential paracentesis and further management. -Patient advised to maintain adequate hydration.   Hypokalemia -Patient's potassium was 3.1 at time of admission -Electrolytes repleted -Follow trend H/H stability with repeat blood work at follow-up visit.  Gastroesophageal reflux disease -Continue Protonix twice a day and Pepcid nightly. -Lifestyle changes discussed with patient.   Consultants: Gastroenterology service. Procedures performed: See below for x-ray reports. Disposition: Home Diet  recommendation: Low-sodium diet.  DISCHARGE MEDICATION: Allergies as  of 11/24/2021       Reactions   Demerol Anaphylaxis   Per patient cardiac arrest        Medication List     STOP taking these medications    propranolol 10 MG tablet Commonly known as: INDERAL       TAKE these medications    Constulose 10 GM/15ML solution Generic drug: lactulose Take 20 g by mouth 3 (three) times daily.   famotidine 40 MG tablet Commonly known as: PEPCID Take 1 tablet (40 mg total) by mouth at bedtime.   furosemide 40 MG tablet Commonly known as: LASIX Take 1 tablet (40 mg total) by mouth daily.   gabapentin 300 MG capsule Commonly known as: NEURONTIN Take 300 mg by mouth 3 (three) times daily as needed (nerve pain).   hydrocortisone 2.5 % rectal cream Commonly known as: ANUSOL-HC Place 1 Application rectally 2 (two) times daily.   hydrOXYzine 25 MG capsule Commonly known as: VISTARIL Take 25 mg by mouth 3 (three) times daily as needed for anxiety.   ondansetron 4 MG tablet Commonly known as: Zofran Take 1 tablet (4 mg total) by mouth every 8 (eight) hours as needed for nausea or vomiting.   pantoprazole 40 MG tablet Commonly known as: PROTONIX Take 1 tablet (40 mg total) by mouth 2 (two) times daily before a meal.   spironolactone 100 MG tablet Commonly known as: ALDACTONE Take 1 tablet (100 mg total) by mouth daily.        Follow-up Information     Neale Burly, MD. Schedule an appointment as soon as possible for a visit in 2 week(s).   Specialty: Internal Medicine Contact information: [address] [place] [id number] [phone]                Discharge Exam: Filed Weights   11/22/21 2016 11/23/21 0402 11/24/21 0500  Weight: 66.7 kg 69 kg 68.7 kg   General exam: Alert, awake, oriented x 3; in no acute distress; currently asymptomatic and feeling ready to go home.  No overt bleeding appreciated. Respiratory system: Clear to auscultation. Respiratory effort normal.  Good saturation on room  air. Cardiovascular system:RRR. No rub or gallops. Gastrointestinal system: Abdomen is mildly distended and not tense with positive fluid wave on exam due to ascites.  Positive bowel sounds.  No guarding.   Central nervous system: Alert and oriented. No focal neurological deficits. Extremities: No cyanosis or clubbing. Skin: No petechiae. Psychiatry: Judgement and insight appear normal. Mood & affect appropriate.    Condition at discharge: Stable and improved.  The results of significant diagnostics from this hospitalization (including imaging, microbiology, ancillary and laboratory) are listed below for reference.   Imaging Studies: DG Chest 2 View  Result Date: 11/22/2021 CLINICAL DATA:  Chest pain EXAM: CHEST - 2 VIEW COMPARISON:  Chest 12/30/2016 FINDINGS: Heart size and vascularity normal. Lungs clear without infiltrate or pneumonia. Minimal pleural effusion on the lateral view not seen previously. IMPRESSION: Minimal pleural effusion.  Negative for pneumonia Electronically Signed   By: Franchot Gallo M.D.   On: 11/22/2021 20:37    Microbiology: Results for orders placed or performed during the hospital encounter of 03/27/21  Resp Panel by RT-PCR (Flu A&B, Covid) Nasopharyngeal Swab     Status: None   Collection Time: 03/27/21  8:42 AM   Specimen: Nasopharyngeal Swab; Nasopharyngeal(NP) swabs in vial transport medium  Result Value Ref Range Status  SARS Coronavirus 2 by RT PCR NEGATIVE NEGATIVE Final    Comment: (NOTE) SARS-CoV-2 target nucleic acids are NOT DETECTED.  The SARS-CoV-2 RNA is generally detectable in upper respiratory specimens during the acute phase of infection. The lowest concentration of SARS-CoV-2 viral copies this assay can detect is 138 copies/mL. A negative result does not preclude SARS-Cov-2 infection and should not be used as the sole basis for treatment or other patient management decisions. A negative result may occur with  improper specimen  collection/handling, submission of specimen other than nasopharyngeal swab, presence of viral mutation(s) within the areas targeted by this assay, and inadequate number of viral copies(<138 copies/mL). A negative result must be combined with clinical observations, patient history, and epidemiological information. The expected result is Negative.  Fact Sheet for Patients:  EntrepreneurPulse.com.au  Fact Sheet for Healthcare Providers:  IncredibleEmployment.be  This test is no t yet approved or cleared by the [place] FDA and  has been authorized for detection and/or diagnosis of SARS-CoV-2 by FDA under an Emergency Use Authorization (EUA). This EUA will remain  in effect (meaning this test can be used) for the duration of the COVID-19 declaration under Section 564(b)(1) of the Act, 21 U.S.C.section 360bbb-3(b)(1), unless the authorization is terminated  or revoked sooner.       Influenza A by PCR NEGATIVE NEGATIVE Final   Influenza B by PCR NEGATIVE NEGATIVE Final    Comment: (NOTE) The Xpert Xpress SARS-CoV-2/FLU/RSV plus assay is intended as an aid in the diagnosis of influenza from Nasopharyngeal swab specimens and should not be used as a sole basis for treatment. Nasal washings and aspirates are unacceptable for Xpert Xpress SARS-CoV-2/FLU/RSV testing.  Fact Sheet for Patients: EntrepreneurPulse.com.au  Fact Sheet for Healthcare Providers: IncredibleEmployment.be  This test is not yet approved or cleared by the [place] FDA and has been authorized for detection and/or diagnosis of SARS-CoV-2 by FDA under an Emergency Use Authorization (EUA). This EUA will remain in effect (meaning this test can be used) for the duration of the COVID-19 declaration under Section 564(b)(1) of the Act, 21 U.S.C. section 360bbb-3(b)(1), unless the authorization is terminated or revoked.  Performed at [facility], [address], [place], [place] [address]   Gram stain     Status: None   Collection Time: 03/27/21 11:15 AM   Specimen: Peritoneal Washings  Result Value Ref Range Status   Specimen Description PERITONEAL  Final   Special Requests NONE  Final   Gram Stain   Final    GRAM POSITIVE COCCI WBC PRESENT, PREDOMINANTLY MONONUCLEAR CYTOSPIN SMEAR Gram Stain Report Called to,Read Back By and Verified With: A CANTER [id number] K FORSYTH Performed at [facility], [address], [place], [place] [address]    Report Status 03/27/2021 FINAL  Final  Culture, body fluid w Gram Stain-bottle     Status: None   Collection Time: 03/27/21 11:15 AM   Specimen: Peritoneal Washings  Result Value Ref Range Status   Specimen Description PERITONEAL  Final   Special Requests   Final    BOTTLES DRAWN AEROBIC AND ANAEROBIC Blood Culture adequate volume   Culture   Final    NO GROWTH 6 DAYS Performed at [facility], [address] [place], [place] [address]    Report Status 04/02/2021 FINAL  Final  Gram stain     Status: None   Collection Time: 03/30/21  1:20 PM   Specimen: Peritoneal Washings  Result Value Ref Range Status   Specimen  Description PERITONEAL  Final   Special Requests NONE  Final   Gram Stain   Final    CYTOSPIN SMEAR NO ORGANISMS SEEN WBC PRESENT, PREDOMINANTLY MONONUCLEAR Performed at [facility], [address] [place], [place] [address]    Report Status 03/30/2021 FINAL  Final  Culture, body fluid w Gram Stain-bottle     Status: None   Collection Time: 03/30/21  1:20 PM   Specimen: Peritoneal Washings  Result Value Ref Range Status   Specimen Description PERITONEAL  Final   Special Requests 10CC  Final   Culture   Final    NO GROWTH 5 DAYS Performed at [facility], [address] [place], [place] [address]    Report Status 04/04/2021 FINAL  Final    Labs: CBC: Recent Labs  Lab 11/22/21 2043 11/23/21 0421  WBC 4.6 3.9*  NEUTROABS 2.4 2.0  HGB 7.5* 7.8*   HCT 22.4* 23.8*  MCV 91.1 90.8  PLT 113* 706*   Basic Metabolic Panel: Recent Labs  Lab 11/22/21 2043 11/23/21 0421  NA 131* 134*  K 3.1* 3.5  CL 99 102  CO2 26 25  GLUCOSE 103* 108*  BUN 10 9  CREATININE 0.94 0.82  CALCIUM 7.9* 8.4*  MG  --  2.1   Liver Function Tests: Recent Labs  Lab 11/22/21 2043 11/23/21 0421  AST 75* 66*  ALT 35 32  ALKPHOS 67 66  BILITOT 3.4* 2.9*  PROT 6.3* 5.6*  ALBUMIN 2.9* 2.7*   CBG: No results for input(s): "GLUCAP" in the last 168 hours.  Discharge time spent: greater than 30 minutes.  Signed: Barton [place], MD [facility] 11/24/2021

## 2021-11-26 ENCOUNTER — Other Ambulatory Visit: Payer: Self-pay | Admitting: Gastroenterology

## 2021-11-26 ENCOUNTER — Telehealth: Payer: Self-pay | Admitting: *Deleted

## 2021-11-26 ENCOUNTER — Ambulatory Visit (HOSPITAL_COMMUNITY)
Admission: RE | Admit: 2021-11-26 | Discharge: 2021-11-26 | Disposition: A | Discharge status: Home / Self Care | Payer: Medicaid Other | Source: Ambulatory Visit | Attending: Gastroenterology | Admitting: Gastroenterology

## 2021-11-26 ENCOUNTER — Other Ambulatory Visit: Payer: Self-pay | Admitting: *Deleted

## 2021-11-26 DIAGNOSIS — K7031 Alcoholic cirrhosis of liver with ascites: Secondary | ICD-10-CM | POA: Insufficient documentation

## 2021-11-26 DIAGNOSIS — K769 Liver disease, unspecified: Secondary | ICD-10-CM | POA: Diagnosis present

## 2021-11-26 MED ORDER — GADOBUTROL 1 MMOL/ML IV SOLN
7.0000 mL | Freq: Once | INTRAVENOUS | Status: AC | PRN
  Administered 2021-11-26: 7 mL via INTRAVENOUS

## 2021-11-26 NOTE — Telephone Encounter (Signed)
Pt informed that US paracentesis is scheduled for 1pm today, check in at 12:30 pm. Pt verbalized understanding

## 2021-11-26 NOTE — Telephone Encounter (Signed)
Loma Sousa can you provide orders for what exactly we need to order?

## 2021-11-26 NOTE — Telephone Encounter (Signed)
-----   Message from Daneil Dolin, MD sent at 11/24/2021  3:36 PM EDT ----- Patient hospitalized over the weekend with acute on chronic anemia.  May benefit from a paracentesis first of next week.  Can you call patient on 30 October and get her in for an ultrasound with possible paracentesis as indicated sometime Monday or Tuesday.  Thanks.

## 2021-12-03 ENCOUNTER — Emergency Department (HOSPITAL_COMMUNITY)
Admission: EM | Admit: 2021-12-03 | Discharge: 2021-12-03 | Disposition: A | Discharge status: Home / Self Care | Payer: Medicaid Other | Attending: Student | Admitting: Student

## 2021-12-03 ENCOUNTER — Other Ambulatory Visit: Payer: Self-pay

## 2021-12-03 ENCOUNTER — Emergency Department (HOSPITAL_COMMUNITY): Payer: Medicaid Other

## 2021-12-03 ENCOUNTER — Encounter (HOSPITAL_COMMUNITY): Payer: Self-pay | Admitting: *Deleted

## 2021-12-03 DIAGNOSIS — F17210 Nicotine dependence, cigarettes, uncomplicated: Secondary | ICD-10-CM | POA: Diagnosis not present

## 2021-12-03 DIAGNOSIS — R110 Nausea: Secondary | ICD-10-CM

## 2021-12-03 DIAGNOSIS — R112 Nausea with vomiting, unspecified: Secondary | ICD-10-CM | POA: Diagnosis present

## 2021-12-03 DIAGNOSIS — R140 Abdominal distension (gaseous): Secondary | ICD-10-CM | POA: Insufficient documentation

## 2021-12-03 LAB — CBC WITH DIFFERENTIAL/PLATELET
Abs Immature Granulocytes: 0.02 10*3/uL (ref 0.00–0.07)
Basophils Absolute: 0 10*3/uL (ref 0.0–0.1)
Basophils Relative: 1 %
Eosinophils Absolute: 0 10*3/uL (ref 0.0–0.5)
Eosinophils Relative: 0 %
HCT: 29.7 % — ABNORMAL LOW (ref 36.0–46.0)
Hemoglobin: 9.6 g/dL — ABNORMAL LOW (ref 12.0–15.0)
Immature Granulocytes: 1 %
Lymphocytes Relative: 24 %
Lymphs Abs: 0.8 10*3/uL (ref 0.7–4.0)
MCH: 29.1 pg (ref 26.0–34.0)
MCHC: 32.3 g/dL (ref 30.0–36.0)
MCV: 90 fL (ref 80.0–100.0)
Monocytes Absolute: 0.2 10*3/uL (ref 0.1–1.0)
Monocytes Relative: 5 %
Neutro Abs: 2.2 10*3/uL (ref 1.7–7.7)
Neutrophils Relative %: 69 %
Platelets: 130 10*3/uL — ABNORMAL LOW (ref 150–400)
RBC: 3.3 MIL/uL — ABNORMAL LOW (ref 3.87–5.11)
RDW: 16.3 % — ABNORMAL HIGH (ref 11.5–15.5)
WBC: 3.2 10*3/uL — ABNORMAL LOW (ref 4.0–10.5)
nRBC: 0 % (ref 0.0–0.2)

## 2021-12-03 LAB — URINALYSIS, ROUTINE W REFLEX MICROSCOPIC
Glucose, UA: NEGATIVE mg/dL
Hgb urine dipstick: NEGATIVE
Ketones, ur: NEGATIVE mg/dL
Leukocytes,Ua: NEGATIVE
Nitrite: NEGATIVE
Protein, ur: 30 mg/dL — AB
Specific Gravity, Urine: 1.025 (ref 1.005–1.030)
pH: 5 (ref 5.0–8.0)

## 2021-12-03 LAB — COMPREHENSIVE METABOLIC PANEL
ALT: 40 U/L (ref 0–44)
AST: 90 U/L — ABNORMAL HIGH (ref 15–41)
Albumin: 3.2 g/dL — ABNORMAL LOW (ref 3.5–5.0)
Alkaline Phosphatase: 85 U/L (ref 38–126)
Anion gap: 10 (ref 5–15)
BUN: 11 mg/dL (ref 6–20)
CO2: 23 mmol/L (ref 22–32)
Calcium: 8.7 mg/dL — ABNORMAL LOW (ref 8.9–10.3)
Chloride: 98 mmol/L (ref 98–111)
Creatinine, Ser: 0.82 mg/dL (ref 0.44–1.00)
GFR, Estimated: >60 mL/min (ref >60)
Glucose, Bld: 138 mg/dL — ABNORMAL HIGH (ref 70–99)
Potassium: 3.7 mmol/L (ref 3.5–5.1)
Sodium: 131 mmol/L — ABNORMAL LOW (ref 135–145)
Total Bilirubin: 4.2 mg/dL — ABNORMAL HIGH (ref 0.3–1.2)
Total Protein: 6.8 g/dL (ref 6.5–8.1)

## 2021-12-03 LAB — TYPE AND SCREEN
ABO/RH(D): O POS
Antibody Screen: NEGATIVE

## 2021-12-03 LAB — LIPASE, BLOOD: Lipase: 42 U/L (ref 11–51)

## 2021-12-03 MED ORDER — METOCLOPRAMIDE HCL 10 MG PO TABS: 10.0000 mg | ORAL_TABLET | Freq: Three times a day (TID) | ORAL | 0 refills | Status: DC | PRN

## 2021-12-03 MED ORDER — LACTATED RINGERS IV BOLUS
1000.0000 mL | Freq: Once | INTRAVENOUS | Status: AC
  Administered 2021-12-03: 1000 mL via INTRAVENOUS

## 2021-12-03 MED ORDER — METOCLOPRAMIDE HCL 5 MG/ML IJ SOLN
10.0000 mg | Freq: Once | INTRAMUSCULAR | Status: AC
  Administered 2021-12-03: 10 mg via INTRAVENOUS
  Filled 2021-12-03: qty 2

## 2021-12-03 MED ORDER — PROCHLORPERAZINE EDISYLATE 10 MG/2ML IJ SOLN
10.0000 mg | Freq: Once | INTRAMUSCULAR | Status: AC
  Administered 2021-12-03: 10 mg via INTRAVENOUS
  Filled 2021-12-03: qty 2

## 2021-12-03 MED ORDER — DIPHENHYDRAMINE HCL 50 MG/ML IJ SOLN
25.0000 mg | Freq: Once | INTRAMUSCULAR | Status: AC
  Administered 2021-12-03: 25 mg via INTRAVENOUS
  Filled 2021-12-03: qty 1

## 2021-12-03 MED ORDER — ONDANSETRON 4 MG PO TBDP: 4.0000 mg | ORAL_TABLET | Freq: Three times a day (TID) | ORAL | 0 refills | Status: DC | PRN

## 2021-12-03 MED ORDER — IOHEXOL 300 MG/ML  SOLN
100.0000 mL | Freq: Once | INTRAMUSCULAR | Status: AC | PRN
  Administered 2021-12-03: 100 mL via INTRAVENOUS

## 2021-12-03 NOTE — ED Notes (Signed)
Pt is resting. She is able to be aroused w/ verbal stimuli and will answer questions, but is quick to fall back asleep. Light food left in the room and pt instructed to eat if she feels able.

## 2021-12-03 NOTE — Discharge Instructions (Signed)
Medications that were sent to your pharmacy are Reglan and Zofran.  Both are for treatment of nausea and vomiting.  Take only as needed.  Return to the emergency department for any worsening severity of symptoms.

## 2021-12-03 NOTE — ED Provider Notes (Signed)
[facility] EMERGENCY DEPARTMENT Provider Note  CSN: [id number] Arrival date & time: 12/03/21 7903  Chief Complaint(s) Emesis  HPI Erica Banks is a 41 y.o. female with PMH cirrhosis, DVT PE, recent hospital admission for symptomatic anemia who presents emergency department for evaluation of emesis.  Patient states that since hospital discharge she has felt some persistent generalized weakness but over the last 12 hours has had multiple episodes of vomiting.  States that vomiting is nonbloody and nonbilious with no coffee grounds.  She endorses increased abdominal distention and states that she was scheduled for a paracentesis but there was not enough fluid for draining.  Denies rectal bleeding, chest pain, shortness of breath, headache, fever or other systemic symptoms.   Past Medical History Past Medical History:  Diagnosis Date   Anemia    Cirrhosis (HCC)    DVT (deep venous thrombosis) (HCC)    Low iron    Neuropathy    Neuropathy    Panic attacks    Pulmonary embolus (HCC) 2019   Patient Active Problem List   Diagnosis Date Noted   Hypocalcemia 11/23/2021   Hypoalbuminemia 11/23/2021   Tobacco use disorder 11/23/2021   Esophageal varices without bleeding (HCC) 10/31/2021   Heartburn 08/23/2021   Nausea without vomiting 08/23/2021   Lesion of liver less than 1 cm in diameter    Acute blood loss anemia 03/27/2021   Alcoholic cirrhosis of liver with ascites (HCC) 03/27/2021   Hyponatremia 03/27/2021   Iron deficiency anemia 03/27/2021   Folate deficiency 03/27/2021   Ascites    Cirrhosis of liver with ascites (HCC)    Intractable nausea and vomiting 07/20/2019   Cholelithiasis 07/20/2019   Prolonged QT interval 07/20/2019   Anemia 01/01/2015   Symptomatic anemia 01/01/2015   Hypokalemia 01/01/2015   Acute gastroenteritis 01/01/2015   Abnormal CT of the abdomen 06/08/2013   Gastroesophageal reflux disease 06/08/2013   Home Medication(s) Prior to Admission  medications   Medication Sig Start Date End Date Taking? Authorizing Provider  CONSTULOSE 10 GM/15ML solution Take 20 g by mouth 3 (three) times daily. Patient not taking: Reported on 11/23/2021 10/16/21   [provider]  famotidine (PEPCID) 40 MG tablet Take 1 tablet (40 mg total) by mouth at bedtime. Patient not taking: Reported on 11/23/2021 10/31/21   Letta Median, PA-C  furosemide (LASIX) 40 MG tablet Take 1 tablet (40 mg total) by mouth daily. 08/24/21   Letta Median, PA-C  gabapentin (NEURONTIN) 300 MG capsule Take 300 mg by mouth 3 (three) times daily as needed (nerve pain). 02/25/21   [provider]  hydrocortisone (ANUSOL-HC) 2.5 % rectal cream Place 1 Application rectally 2 (two) times daily. 10/31/21   Letta Median, PA-C  hydrOXYzine (VISTARIL) 25 MG capsule Take 25 mg by mouth 3 (three) times daily as needed for anxiety. 11/13/21   [provider]  ondansetron (ZOFRAN) 4 MG tablet Take 1 tablet (4 mg total) by mouth every 8 (eight) hours as needed for nausea or vomiting. 08/24/21 08/24/22  Letta Median, PA-C  pantoprazole (PROTONIX) 40 MG tablet Take 1 tablet (40 mg total) by mouth 2 (two) times daily before a meal. 03/30/21   Erick Blinks, MD  spironolactone (ALDACTONE) 100 MG tablet Take 1 tablet (100 mg total) by mouth daily. 08/24/21   Letta Median, PA-C  Past Surgical History Past Surgical History:  Procedure Laterality Date   ABDOMINAL HYSTERECTOMY     BIOPSY N/A 06/24/2013   Procedure: BIOPSY TERMINAL ILEUM;  Surgeon: Corbin Ade, MD;  Location: [facility];  Service: Endoscopy;  Laterality: N/A;   CHOLECYSTECTOMY     COLONOSCOPY N/A 06/24/2013   KXF:[id number] hemorrhoids/otherwise normal    COLONOSCOPY WITH PROPOFOL N/A 05/31/2021   Surgeon: Corbin Ade, MD; ery large nonbleeding internal  and external hemorrhoids, portal colopathy.  Recommended repeat colonoscopy in 10 years.   ESOPHAGOGASTRODUODENOSCOPY (EGD) WITH PROPOFOL N/A 03/29/2021   Surgeon: Lanelle Bal, DO;  grade 1 esophageal varices, portal hypertensive gastropathy with friable tissue with spontaneous ooze, normal duodenum.   ESOPHAGOGASTRODUODENOSCOPY (EGD) WITH PROPOFOL N/A 05/31/2021   Surgeon: Corbin Ade, MD;  grade 1-2 esophageal varices without bleeding stigmata, advanced appearing portal hypertensive gastropathy.   nasal bone surgery     NASAL SINUS SURGERY     SINUS IRRIGATION     TONSILLECTOMY     TUBAL LIGATION     Family History Family History  Problem Relation Age of Onset   Diabetes Mother    Hypertension Mother    COPD Mother    Cirrhosis Father        ETOH   Diabetes Sister    Hypertension Sister    Crohn's disease Maternal Grandmother    Cancer Other    Colon cancer Neg Hx    Autoimmune disease Neg Hx     Social History Social History   Tobacco Use   Smoking status: Every Day    Packs/day: 0.50    Years: 12.00    Total pack years: 6.00    Types: Cigarettes   Smokeless tobacco: Never  Vaping Use   Vaping Use: Never used  Substance Use Topics   Alcohol use: Not Currently    Alcohol/week: 4.0 - 5.0 standard drinks of alcohol    Types: 4 - 5 Cans of beer per week    Comment: None since February 2023.   Drug use: Yes    Types: Marijuana    Comment: occasional   Allergies Demerol  Review of Systems Review of Systems  Constitutional:  Positive for fatigue.  Gastrointestinal:  Positive for nausea and vomiting.    Physical Exam Vital Signs  I have reviewed the triage vital signs BP 116/70 (BP Location: Right Arm)   Pulse 83   Temp 97.8 F (36.6 C) (Oral)   Resp 16   Ht 5\' 6"  (1.676 m)   Wt 68.7 kg   LMP  (LMP Unknown)   SpO2 100%   BMI 24.45 kg/m   Physical Exam Vitals and nursing note reviewed.  Constitutional:      General: She is not in acute  distress.    Appearance: She is well-developed. She is ill-appearing.  HENT:     Head: Normocephalic and atraumatic.  Eyes:     Conjunctiva/sclera: Conjunctivae normal.  Cardiovascular:     Rate and Rhythm: Normal rate and regular rhythm.     Heart sounds: No murmur heard. Pulmonary:     Effort: Pulmonary effort is normal. No respiratory distress.     Breath sounds: Normal breath sounds.  Abdominal:     General: There is distension.     Palpations: Abdomen is soft.     Tenderness: There is no abdominal tenderness.  Musculoskeletal:        General: No swelling.     Cervical back: Neck supple.  Skin:    General: Skin is warm and dry.     Capillary Refill: Capillary refill takes less than 2 seconds.  Neurological:     Mental Status: She is alert.  Psychiatric:        Mood and Affect: Mood normal.     ED Results and Treatments Labs (all labs ordered are listed, but only abnormal results are displayed) Labs Reviewed  CBC WITH DIFFERENTIAL/PLATELET - Abnormal; Notable for the following components:      Result Value   WBC 3.2 (*)    RBC 3.30 (*)    Hemoglobin 9.6 (*)    HCT 29.7 (*)    RDW 16.3 (*)    Platelets 130 (*)    All other components within normal limits  COMPREHENSIVE METABOLIC PANEL  LIPASE, BLOOD  URINALYSIS, ROUTINE W REFLEX MICROSCOPIC  TYPE AND SCREEN                                                                                                                          Radiology No results found.  Pertinent labs & imaging results that were available during my care of the patient were reviewed by me and considered in my medical decision making (see MDM for details).  Medications Ordered in ED Medications  prochlorperazine (COMPAZINE) injection 10 mg (10 mg Intravenous Given 12/03/21 0907)  diphenhydrAMINE (BENADRYL) injection 25 mg (25 mg Intravenous Given 12/03/21 0906)  lactated ringers bolus 1,000 mL (1,000 mLs Intravenous New Bag/Given 12/03/21 5409)                                                                                                                                      Procedures Procedures  (including critical care time)  Medical Decision Making / ED Course   This patient presents to the ED for concern of nausea, vomiting, this involves an extensive number of treatment options, and is a complaint that carries with it a high risk of complications and morbidity.  The differential diagnosis includes gastroenteritis, SBP, UTI, intra-abdominal infection, decompensated cirrhosis  MDM: Patient seen in the emergency room for evaluation of nausea and vomiting.  Physical exam with some abdominal distention and generalized tenderness to palpation but is otherwise unremarkable.  Laboratory evaluation with leukopenia to 3.2, hemoglobin 9.6 and platelet count 130 consistent with bone marrow suppression from alcohol use, mild hyponatremia 131, AST 90, total bili  4.2 which has increased from 10 days ago.  Urinalysis unremarkable.  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  Patient given Compazine and Benadryl and on reevaluation symptoms appear to be improving but patient remains somnolent.  At time of signout, patient is pending p.o. challenge.  Please see provider signout for continuation of work-up.  Anticipate admission if patient unable to tolerate p.o.  Anticipate discharge if symptoms improved.   Additional history obtained: -Additional history obtained from family member -External records from outside source obtained and reviewed including: Chart review including previous notes, labs, imaging, consultation notes   Lab Tests: -I ordered, reviewed, and interpreted labs.   The pertinent results include:   Labs Reviewed  CBC WITH  DIFFERENTIAL/PLATELET - Abnormal; Notable for the following components:      Result Value   WBC 3.2 (*)    RBC 3.30 (*)    Hemoglobin 9.6 (*)    HCT 29.7 (*)    RDW 16.3 (*)    Platelets 130 (*)    All other components within normal limits  COMPREHENSIVE METABOLIC PANEL  LIPASE, BLOOD  URINALYSIS, ROUTINE W REFLEX MICROSCOPIC  TYPE AND SCREEN      Imaging Studies ordered: I ordered imaging studies including CTAP I independently visualized and interpreted imaging. I agree with the radiologist interpretation   Medicines ordered and prescription drug management: Meds ordered this encounter  Medications   prochlorperazine (COMPAZINE) injection 10 mg   diphenhydrAMINE (BENADRYL) injection 25 mg   lactated ringers bolus 1,000 mL    -I have reviewed the patients home medicines and have made adjustments as needed  Critical interventions none    Cardiac Monitoring: The patient was maintained on a cardiac monitor.  I personally viewed and interpreted the cardiac monitored which showed an underlying rhythm of: NSR  Social Determinants of Health:  Factors impacting patients care include: none   Reevaluation: After the interventions noted above, I reevaluated the patient and found that they have :improved  Co morbidities that complicate the patient evaluation  Past Medical History:  Diagnosis Date   Anemia    Cirrhosis (HCC)    DVT (deep venous thrombosis) (HCC)    Low iron    Neuropathy    Neuropathy    Panic attacks    Pulmonary embolus (HCC) 2019      Dispostion: I considered admission for this patient, and disposition pending ability to tolerate p.o.  Please see provider signout for continuation of work-up.     Final Clinical Impression(s) / ED Diagnoses Final diagnoses:  None     @PCDICTATION @    , MD 12/03/21 1525

## 2021-12-03 NOTE — ED Provider Notes (Signed)
Care of patient assumed from Dr. Tinnie Gens at 3:15 PM.  She presented to the ED due to nausea and vomiting.  She has received IV fluids, Compazine, Benadryl.  She will require p.o. challenge.  If p.o. challenge is successful, she can be discharged with Phenergan suppositories. Physical Exam  BP 117/73 (BP Location: Right Arm)   Pulse 96   Temp 98 F (36.7 C) (Oral)   Resp 16   Ht 5\' 6"  (1.676 m)   Wt 68.7 kg   LMP  (LMP Unknown)   SpO2 96%   BMI 24.45 kg/m   Physical Exam Vitals and nursing note reviewed.  Constitutional:      General: She is not in acute distress.    Appearance: Normal appearance. She is well-developed. She is ill-appearing (Chronically). She is not toxic-appearing or diaphoretic.  HENT:     Head: Normocephalic and atraumatic.     Right Ear: External ear normal.     Left Ear: External ear normal.     Nose: Nose normal.     Mouth/Throat:     Mouth: Mucous membranes are moist.     Pharynx: Oropharynx is clear.  Eyes:     Extraocular Movements: Extraocular movements intact.     Conjunctiva/sclera: Conjunctivae normal.  Cardiovascular:     Rate and Rhythm: Normal rate and regular rhythm.  Pulmonary:     Effort: Pulmonary effort is normal. No respiratory distress.  Abdominal:     General: There is distension.     Palpations: Abdomen is soft.     Tenderness: There is no abdominal tenderness.  Musculoskeletal:     Cervical back: Normal range of motion and neck supple.  Skin:    General: Skin is warm and dry.     Capillary Refill: Capillary refill takes less than 2 seconds.     Coloration: Skin is not jaundiced or pale.  Neurological:     General: No focal deficit present.     Mental Status: She is alert and oriented to person, place, and time.  Psychiatric:        Mood and Affect: Mood normal.        Behavior: Behavior normal.        Thought Content: Thought content normal.        Judgment: Judgment normal.     Procedures  Procedures  ED Course / MDM     Medical Decision Making Amount and/or Complexity of Data Reviewed Labs: ordered. Radiology: ordered.  Risk Prescription drug management.   On assessment, patient tolerating sips of water and small mount of graham cracker.  She does endorse continued nausea.  Reglan was ordered.  Patient experienced significant relief from her nausea after Reglan.  Patient requesting discharge home at this time.  Will prescribe Reglan and ODT Zofran as needed.       Godfrey Pick, MD 12/03/21 1810

## 2021-12-03 NOTE — ED Triage Notes (Signed)
Pt states she was discharged from [facility] on Saturday and since then she has been feeling weak but states this am at 2 she started vomiting.  Pt has taken 3 zofran with no relief

## 2021-12-13 ENCOUNTER — Telehealth: Payer: Self-pay | Admitting: *Deleted

## 2021-12-13 NOTE — Telephone Encounter (Signed)
FYI routing to you in absence of Kristen Harper,PA Pt called and states she is swelling with fluid. She states increased abdominal distention and states that she was scheduled for a paracentesis a couple weeks ago,  but there was not enough fluid for draining.  Now she feels that there is enough fluid, because she states she has swelling in her feet, legs and her abdomen. And she feels  pressure is in her chest area.

## 2021-12-14 ENCOUNTER — Encounter (HOSPITAL_COMMUNITY): Payer: Self-pay

## 2021-12-14 ENCOUNTER — Other Ambulatory Visit: Payer: Self-pay | Admitting: *Deleted

## 2021-12-14 ENCOUNTER — Ambulatory Visit (HOSPITAL_COMMUNITY)
Admission: RE | Admit: 2021-12-14 | Discharge: 2021-12-14 | Disposition: A | Discharge status: Home / Self Care | Payer: Medicaid Other | Source: Ambulatory Visit | Attending: Internal Medicine | Admitting: Internal Medicine

## 2021-12-14 VITALS — BP 114/59 | HR 66 | Temp 97.8°F | Resp 18

## 2021-12-14 DIAGNOSIS — K7031 Alcoholic cirrhosis of liver with ascites: Secondary | ICD-10-CM | POA: Diagnosis present

## 2021-12-14 DIAGNOSIS — R188 Other ascites: Secondary | ICD-10-CM

## 2021-12-14 DIAGNOSIS — K7460 Unspecified cirrhosis of liver: Secondary | ICD-10-CM | POA: Diagnosis present

## 2021-12-14 LAB — BODY FLUID CELL COUNT WITH DIFFERENTIAL
Eos, Fluid: 0 %
Lymphs, Fluid: 9 %
Monocyte-Macrophage-Serous Fluid: 17 % — ABNORMAL LOW (ref 50–90)
Neutrophil Count, Fluid: 74 % — ABNORMAL HIGH (ref 0–25)
Total Nucleated Cell Count, Fluid: 782 cu mm (ref 0–1000)

## 2021-12-14 NOTE — Progress Notes (Signed)
Paracentesis complete no signs of distress.  

## 2021-12-14 NOTE — Telephone Encounter (Signed)
Pt informed to be at [facility] by 10 am this morning and they will work her in for the US paracentesis. Pt says she can make it there and is on her way.

## 2021-12-14 NOTE — Telephone Encounter (Signed)
Noted  

## 2021-12-14 NOTE — Procedures (Signed)
PreOperative Dx: Alcoholic cirrhosis, ascites Postoperative Dx: Alcoholic cirrhosis, ascites Procedure:   US guided paracentesis Radiologist:  Tyron Russell Anesthesia:  10 ml of 1% lidocaine Specimen:  3.3 L of yellow ascitic fluid EBL:   < 1 ml Complications:  None

## 2021-12-15 LAB — GRAM STAIN

## 2021-12-16 ENCOUNTER — Other Ambulatory Visit: Payer: Self-pay | Admitting: Gastroenterology

## 2021-12-16 DIAGNOSIS — K7460 Unspecified cirrhosis of liver: Secondary | ICD-10-CM

## 2021-12-17 ENCOUNTER — Other Ambulatory Visit: Payer: Self-pay | Admitting: *Deleted

## 2021-12-17 ENCOUNTER — Telehealth: Payer: Self-pay | Admitting: *Deleted

## 2021-12-17 LAB — PATHOLOGIST SMEAR REVIEW

## 2021-12-17 NOTE — Telephone Encounter (Signed)
Thanks, [employer]. We typically would not want to stop this medication abruptly. However, it was a small dose, and she has been off of it a few days as you said.  We can evaluate in the office resuming this when I see her in about a week.

## 2021-12-17 NOTE — Telephone Encounter (Signed)
Spoke to pt, she took BP while I was on the phone and it was 134/77. She has not taking the Propranolol in a couple day. I transferred her to the front to make in OV.

## 2021-12-17 NOTE — Telephone Encounter (Signed)
Please have her recheck BP. She needs an office visit for further assessment. Recent para.

## 2021-12-17 NOTE — Telephone Encounter (Signed)
Pt called and states she has stop taking Propranolol 10mg  on Friday, because her BP was low and she was not feeling well. She states her lowest BP was 88/47. Informed her that I would let provider know, but to call if she notices swelling.

## 2021-12-17 NOTE — Telephone Encounter (Signed)
Noted. She has an OV with you on 11/29

## 2021-12-18 ENCOUNTER — Inpatient Hospital Stay (HOSPITAL_COMMUNITY)
Admission: EM | Admit: 2021-12-18 | Discharge: 2021-12-22 | DRG: 432 | Discharge status: Against Medical Advice | Payer: Medicaid Other | Attending: Internal Medicine | Admitting: Internal Medicine

## 2021-12-18 ENCOUNTER — Emergency Department (HOSPITAL_COMMUNITY): Payer: Medicaid Other

## 2021-12-18 ENCOUNTER — Other Ambulatory Visit: Payer: Self-pay

## 2021-12-18 ENCOUNTER — Encounter (HOSPITAL_COMMUNITY): Payer: Self-pay | Admitting: *Deleted

## 2021-12-18 DIAGNOSIS — N179 Acute kidney failure, unspecified: Secondary | ICD-10-CM | POA: Diagnosis present

## 2021-12-18 DIAGNOSIS — D500 Iron deficiency anemia secondary to blood loss (chronic): Secondary | ICD-10-CM | POA: Diagnosis not present

## 2021-12-18 DIAGNOSIS — Z5329 Procedure and treatment not carried out because of patient's decision for other reasons: Secondary | ICD-10-CM | POA: Diagnosis present

## 2021-12-18 DIAGNOSIS — D649 Anemia, unspecified: Secondary | ICD-10-CM

## 2021-12-18 DIAGNOSIS — R531 Weakness: Secondary | ICD-10-CM

## 2021-12-18 DIAGNOSIS — Z885 Allergy status to narcotic agent status: Secondary | ICD-10-CM

## 2021-12-18 DIAGNOSIS — K766 Portal hypertension: Secondary | ICD-10-CM | POA: Diagnosis present

## 2021-12-18 DIAGNOSIS — R9431 Abnormal electrocardiogram [ECG] [EKG]: Secondary | ICD-10-CM | POA: Diagnosis present

## 2021-12-18 DIAGNOSIS — D696 Thrombocytopenia, unspecified: Secondary | ICD-10-CM | POA: Diagnosis present

## 2021-12-18 DIAGNOSIS — I8510 Secondary esophageal varices without bleeding: Secondary | ICD-10-CM | POA: Diagnosis present

## 2021-12-18 DIAGNOSIS — R112 Nausea with vomiting, unspecified: Secondary | ICD-10-CM

## 2021-12-18 DIAGNOSIS — G629 Polyneuropathy, unspecified: Secondary | ICD-10-CM | POA: Diagnosis present

## 2021-12-18 DIAGNOSIS — E8809 Other disorders of plasma-protein metabolism, not elsewhere classified: Secondary | ICD-10-CM | POA: Diagnosis present

## 2021-12-18 DIAGNOSIS — Z6823 Body mass index (BMI) 23.0-23.9, adult: Secondary | ICD-10-CM

## 2021-12-18 DIAGNOSIS — K219 Gastro-esophageal reflux disease without esophagitis: Secondary | ICD-10-CM | POA: Diagnosis present

## 2021-12-18 DIAGNOSIS — E876 Hypokalemia: Secondary | ICD-10-CM | POA: Diagnosis present

## 2021-12-18 DIAGNOSIS — Z79899 Other long term (current) drug therapy: Secondary | ICD-10-CM

## 2021-12-18 DIAGNOSIS — Z86711 Personal history of pulmonary embolism: Secondary | ICD-10-CM

## 2021-12-18 DIAGNOSIS — K649 Unspecified hemorrhoids: Secondary | ICD-10-CM | POA: Diagnosis not present

## 2021-12-18 DIAGNOSIS — E7220 Disorder of urea cycle metabolism, unspecified: Secondary | ICD-10-CM | POA: Diagnosis not present

## 2021-12-18 DIAGNOSIS — K652 Spontaneous bacterial peritonitis: Secondary | ICD-10-CM | POA: Diagnosis present

## 2021-12-18 DIAGNOSIS — Z1152 Encounter for screening for COVID-19: Secondary | ICD-10-CM

## 2021-12-18 DIAGNOSIS — E871 Hypo-osmolality and hyponatremia: Secondary | ICD-10-CM | POA: Diagnosis present

## 2021-12-18 DIAGNOSIS — F17210 Nicotine dependence, cigarettes, uncomplicated: Secondary | ICD-10-CM | POA: Diagnosis present

## 2021-12-18 DIAGNOSIS — Z86718 Personal history of other venous thrombosis and embolism: Secondary | ICD-10-CM

## 2021-12-18 DIAGNOSIS — E440 Moderate protein-calorie malnutrition: Secondary | ICD-10-CM | POA: Diagnosis present

## 2021-12-18 DIAGNOSIS — K7682 Hepatic encephalopathy: Secondary | ICD-10-CM | POA: Diagnosis present

## 2021-12-18 DIAGNOSIS — F17200 Nicotine dependence, unspecified, uncomplicated: Secondary | ICD-10-CM | POA: Diagnosis present

## 2021-12-18 DIAGNOSIS — E860 Dehydration: Secondary | ICD-10-CM | POA: Diagnosis present

## 2021-12-18 DIAGNOSIS — Z8249 Family history of ischemic heart disease and other diseases of the circulatory system: Secondary | ICD-10-CM | POA: Diagnosis not present

## 2021-12-18 DIAGNOSIS — K7031 Alcoholic cirrhosis of liver with ascites: Secondary | ICD-10-CM | POA: Diagnosis present

## 2021-12-18 DIAGNOSIS — D509 Iron deficiency anemia, unspecified: Secondary | ICD-10-CM | POA: Diagnosis present

## 2021-12-18 DIAGNOSIS — I8500 Esophageal varices without bleeding: Secondary | ICD-10-CM | POA: Diagnosis not present

## 2021-12-18 DIAGNOSIS — T182XXA Foreign body in stomach, initial encounter: Secondary | ICD-10-CM | POA: Diagnosis not present

## 2021-12-18 LAB — BASIC METABOLIC PANEL
Anion gap: 9 (ref 5–15)
BUN: 24 mg/dL — ABNORMAL HIGH (ref 6–20)
CO2: 19 mmol/L — ABNORMAL LOW (ref 22–32)
Calcium: 8.1 mg/dL — ABNORMAL LOW (ref 8.9–10.3)
Chloride: 101 mmol/L (ref 98–111)
Creatinine, Ser: 1.53 mg/dL — ABNORMAL HIGH (ref 0.44–1.00)
GFR, Estimated: 44 mL/min — ABNORMAL LOW (ref >60)
Glucose, Bld: 136 mg/dL — ABNORMAL HIGH (ref 70–99)
Potassium: 2.8 mmol/L — ABNORMAL LOW (ref 3.5–5.1)
Sodium: 129 mmol/L — ABNORMAL LOW (ref 135–145)

## 2021-12-18 LAB — HEPATIC FUNCTION PANEL
ALT: 41 U/L (ref 0–44)
AST: 100 U/L — ABNORMAL HIGH (ref 15–41)
Albumin: 2.6 g/dL — ABNORMAL LOW (ref 3.5–5.0)
Alkaline Phosphatase: 96 U/L (ref 38–126)
Bilirubin, Direct: 1.1 mg/dL — ABNORMAL HIGH (ref 0.0–0.2)
Indirect Bilirubin: 1.8 mg/dL — ABNORMAL HIGH (ref 0.3–0.9)
Total Bilirubin: 2.9 mg/dL — ABNORMAL HIGH (ref 0.3–1.2)
Total Protein: 5.8 g/dL — ABNORMAL LOW (ref 6.5–8.1)

## 2021-12-18 LAB — CBC
HCT: 25.6 % — ABNORMAL LOW (ref 36.0–46.0)
Hemoglobin: 8.6 g/dL — ABNORMAL LOW (ref 12.0–15.0)
MCH: 29.6 pg (ref 26.0–34.0)
MCHC: 33.6 g/dL (ref 30.0–36.0)
MCV: 88 fL (ref 80.0–100.0)
Platelets: 141 10*3/uL — ABNORMAL LOW (ref 150–400)
RBC: 2.91 MIL/uL — ABNORMAL LOW (ref 3.87–5.11)
RDW: 16.7 % — ABNORMAL HIGH (ref 11.5–15.5)
WBC: 4.7 10*3/uL (ref 4.0–10.5)
nRBC: 0 % (ref 0.0–0.2)

## 2021-12-18 LAB — RESP PANEL BY RT-PCR (FLU A&B, COVID) ARPGX2
Influenza A by PCR: NEGATIVE
Influenza B by PCR: NEGATIVE
SARS Coronavirus 2 by RT PCR: NEGATIVE

## 2021-12-18 LAB — PROTIME-INR
INR: 1.7 — ABNORMAL HIGH (ref 0.8–1.2)
Prothrombin Time: 19.5 seconds — ABNORMAL HIGH (ref 11.4–15.2)

## 2021-12-18 LAB — URINALYSIS, ROUTINE W REFLEX MICROSCOPIC
Bilirubin Urine: NEGATIVE
Glucose, UA: NEGATIVE mg/dL
Hgb urine dipstick: NEGATIVE
Ketones, ur: NEGATIVE mg/dL
Leukocytes,Ua: NEGATIVE
Nitrite: NEGATIVE
Protein, ur: NEGATIVE mg/dL
Specific Gravity, Urine: 1.009 (ref 1.005–1.030)
pH: 6 (ref 5.0–8.0)

## 2021-12-18 LAB — MAGNESIUM: Magnesium: 1.6 mg/dL — ABNORMAL LOW (ref 1.7–2.4)

## 2021-12-18 LAB — AMMONIA: Ammonia: 150 umol/L — ABNORMAL HIGH (ref 9–35)

## 2021-12-18 MED ORDER — LACTULOSE 10 GM/15ML PO SOLN
ORAL | Status: AC
  Filled 2021-12-18: qty 30

## 2021-12-18 MED ORDER — ALBUMIN HUMAN 25 % IV SOLN
50.0000 g | Freq: Three times a day (TID) | INTRAVENOUS | Status: AC
  Administered 2021-12-18 – 2021-12-19 (×2): 50 g via INTRAVENOUS
  Filled 2021-12-18 (×2): qty 200

## 2021-12-18 MED ORDER — LACTULOSE 10 GM/15ML PO SOLN
20.0000 g | Freq: Once | ORAL | Status: AC
  Administered 2021-12-18: 20 g via ORAL

## 2021-12-18 MED ORDER — MAGNESIUM SULFATE 2 GM/50ML IV SOLN
INTRAVENOUS | Status: AC
  Filled 2021-12-18: qty 50

## 2021-12-18 MED ORDER — ALBUMIN HUMAN 25 % IV SOLN
50.0000 g | Freq: Once | INTRAVENOUS | Status: DC
  Filled 2021-12-18: qty 200

## 2021-12-18 MED ORDER — DIPHENHYDRAMINE HCL 50 MG/ML IJ SOLN
25.0000 mg | Freq: Once | INTRAMUSCULAR | Status: AC
  Administered 2021-12-18: 25 mg via INTRAVENOUS

## 2021-12-18 MED ORDER — MAGNESIUM SULFATE 2 GM/50ML IV SOLN
2.0000 g | Freq: Once | INTRAVENOUS | Status: AC
  Administered 2021-12-18: 2 g via INTRAVENOUS

## 2021-12-18 MED ORDER — DIPHENHYDRAMINE HCL 50 MG/ML IJ SOLN
INTRAMUSCULAR | Status: AC
  Filled 2021-12-18: qty 1

## 2021-12-18 MED ORDER — ALBUMIN HUMAN 25 % IV SOLN
INTRAVENOUS | Status: AC
  Filled 2021-12-18: qty 200

## 2021-12-18 MED ORDER — SODIUM CHLORIDE 0.9 % IV BOLUS
500.0000 mL | Freq: Once | INTRAVENOUS | Status: DC
  Administered 2021-12-18: 500 mL via INTRAVENOUS

## 2021-12-18 MED ORDER — POTASSIUM CHLORIDE 10 MEQ/100ML IV SOLN
INTRAVENOUS | Status: AC
  Filled 2021-12-18: qty 100

## 2021-12-18 MED ORDER — POTASSIUM CHLORIDE 10 MEQ/100ML IV SOLN
10.0000 meq | INTRAVENOUS | Status: AC
  Administered 2021-12-18 – 2021-12-19 (×6): 10 meq via INTRAVENOUS
  Filled 2021-12-18: qty 100

## 2021-12-18 NOTE — ED Triage Notes (Signed)
Pt states she recently was discharge from hospital 2 weeks ago and 2 days ago started nausea and vomiting, denies diarrhea, pt states feeling tired and weak, pt has a hx of cirrhosis

## 2021-12-18 NOTE — H&P (Incomplete)
History and Physical    Patient: Erica Banks BJS:[id number] DOB: 1980-10-04 DOA: 12/18/2021 DOS: the patient was seen and examined on 12/19/2021 PCP: Toma Deiters, MD  Patient coming from: Home  Chief Complaint:  Chief Complaint  Patient presents with   Weakness   HPI: Erica Banks is a 41 y.o. female with medical history significant of alcoholic cirrhosis, DVT, neuropathy, pulmonary embolism, GERD, iron deficiency anemia who presents to the emergency department due to weakness, nausea and vomiting.  She endorsed 1 week of nausea on 2 days of nonbloody vomiting, but was unable to provide further history due to feeling sleepy.  Rest of history was obtained from ED PA and ED medical record.  Per report, patient complained of worsening nausea and vomiting after eating and she has not eaten anything since yesterday morning (12/17/2021).  She complained of shortness of breath at rest, she states that last paracentesis was 8 days ago.  Abdomen has become distended within the past 3 days with some right-sided discomfort.  Patient reported that she has been compliant with her medications and has since abstained from drinking alcohol.  She has 25 pack year history of smoking and occasionally uses marijuana (last use was 1.5 weeks ago).  She denies chest pain, fever, lightheadedness, dizziness or diarrhea.   ED Course:  In the emergency department, she was hemodynamically stable.  Workup in the ED showed normocytic anemia, thrombocytopenia, sodium 129, potassium 2.8, chloride 101, bicarb 19, glucose 136, BUN/creatinine 24/1.53 (baseline creatinine at 0.7-0.9), ammonia 150, magnesium 1.6, albumin 2.6, AST 100, ALT 42, ALP 96, total bilirubin 2.9.  Urinalysis was normal.  Influenza A, B, SARS coronavirus 2 was negative. Chest x-ray showed no active disease. Patient was treated with lactulose, Benadryl was given, magnesium and potassium were replenished. Gastroenterology (Dr. Levon Hedger) was  consulted by ED physician and recommended repletion of electrolytes, albumin 50 g every 8 hours for hyponatremia and ascites and ultrasound paracentesis for both therapeutic and diagnostic purposes.  Hospitalist was asked to admit patient for further evaluation and management.  Review of Systems: Review of systems as noted in the HPI. All other systems reviewed and are negative.   Past Medical History:  Diagnosis Date   Anemia    Cirrhosis (HCC)    DVT (deep venous thrombosis) (HCC)    Low iron    Neuropathy    Neuropathy    Panic attacks    Pulmonary embolus (HCC) 2019   Past Surgical History:  Procedure Laterality Date   ABDOMINAL HYSTERECTOMY     BIOPSY N/A 06/24/2013   Procedure: BIOPSY TERMINAL ILEUM;  Surgeon: Corbin Ade, MD;  Location: [facility];  Service: Endoscopy;  Laterality: N/A;   CHOLECYSTECTOMY     COLONOSCOPY N/A 06/24/2013   YWV:[id number] hemorrhoids/otherwise normal    COLONOSCOPY WITH PROPOFOL N/A 05/31/2021   Surgeon: Corbin Ade, MD; ery large nonbleeding internal and external hemorrhoids, portal colopathy.  Recommended repeat colonoscopy in 10 years.   ESOPHAGOGASTRODUODENOSCOPY (EGD) WITH PROPOFOL N/A 03/29/2021   Surgeon: Lanelle Bal, DO;  grade 1 esophageal varices, portal hypertensive gastropathy with friable tissue with spontaneous ooze, normal duodenum.   ESOPHAGOGASTRODUODENOSCOPY (EGD) WITH PROPOFOL N/A 05/31/2021   Surgeon: Corbin Ade, MD;  grade 1-2 esophageal varices without bleeding stigmata, advanced appearing portal hypertensive gastropathy.   nasal bone surgery     NASAL SINUS SURGERY     SINUS IRRIGATION     TONSILLECTOMY     TUBAL LIGATION  Social History:  reports that she has been smoking cigarettes. She has a 6.00 pack-year smoking history. She has never used smokeless tobacco. She reports that she does not currently use alcohol after a past usage of about 4.0 - 5.0 standard drinks of alcohol per week. She  reports current drug use. Drug: Marijuana.   Allergies  Allergen Reactions   Demerol Anaphylaxis    Per patient cardiac arrest    Family History  Problem Relation Age of Onset   Diabetes Mother    Hypertension Mother    COPD Mother    Cirrhosis Father        ETOH   Diabetes Sister    Hypertension Sister    Crohn's disease Maternal Grandmother    Cancer Other    Colon cancer Neg Hx    Autoimmune disease Neg Hx      Prior to Admission medications   Medication Sig Start Date End Date Taking? Authorizing Provider  CONSTULOSE 10 GM/15ML solution Take 20 g by mouth 3 (three) times daily. Patient not taking: Reported on 11/23/2021 10/16/21   [provider]  famotidine (PEPCID) 40 MG tablet Take 1 tablet (40 mg total) by mouth at bedtime. Patient not taking: Reported on 11/23/2021 10/31/21   Letta Median, PA-C  furosemide (LASIX) 40 MG tablet Take 1 tablet (40 mg total) by mouth daily. 08/24/21   Letta Median, PA-C  gabapentin (NEURONTIN) 300 MG capsule Take 300 mg by mouth 3 (three) times daily as needed (nerve pain). 02/25/21   [provider]  hydrocortisone (ANUSOL-HC) 2.5 % rectal cream Place 1 Application rectally 2 (two) times daily. 10/31/21   Letta Median, PA-C  hydrOXYzine (VISTARIL) 25 MG capsule Take 25 mg by mouth 3 (three) times daily as needed for anxiety. 11/13/21   [provider]  metoCLOPramide (REGLAN) 10 MG tablet Take 1 tablet (10 mg total) by mouth every 8 (eight) hours as needed for nausea or vomiting. 12/03/21   Gloris Manchester, MD  ondansetron (ZOFRAN) 4 MG tablet Take 1 tablet (4 mg total) by mouth every 8 (eight) hours as needed for nausea or vomiting. 08/24/21 08/24/22  Letta Median, PA-C  ondansetron (ZOFRAN-ODT) 4 MG disintegrating tablet Take 1 tablet (4 mg total) by mouth every 8 (eight) hours as needed for nausea or vomiting. 12/03/21   Gloris Manchester, MD  pantoprazole (PROTONIX) 40 MG tablet Take 1 tablet (40 mg total) by  mouth 2 (two) times daily before a meal. 03/30/21   Erick Blinks, MD  propranolol (INDERAL) 10 MG tablet Take 10 mg by mouth daily. 11/29/21   [provider]  spironolactone (ALDACTONE) 100 MG tablet Take 1 tablet (100 mg total) by mouth daily. 08/24/21   Letta Median, PA-C    Physical Exam: BP 125/71 (BP Location: Right Arm)   Pulse 72   Temp 98.1 F (36.7 C) (Oral)   Resp 14   Ht 5\' 6"  (1.676 m)   Wt 66.1 kg   LMP  (LMP Unknown)   SpO2 100%   BMI 23.52 kg/m   General: 41 y.o. year-old female well developed well nourished in no acute distress.  Alert and oriented x3. HEENT: NCAT, EOMI, sclera icterus Neck: Supple, trachea medial Cardiovascular: Regular rate and rhythm with no rubs or gallops.  No thyromegaly or JVD noted.  No lower extremity edema. 2/4 pulses in all 4 extremities. Respiratory: Clear to auscultation with no wheezes or rales. Good inspiratory effort. Abdomen: Soft, nontender but  distended with positive fluid wave.   Muskuloskeletal: No cyanosis, clubbing or edema noted bilaterally Neuro: CN II-XII intact, strength 5/5 x 4, sensation, reflexes intact Skin: No ulcerative lesions noted or rashes Psychiatry: Mood is appropriate for condition and setting          Labs on Admission:  Basic Metabolic Panel: Recent Labs  Lab 12/18/21 1830 12/18/21 2118  NA 129*  --   K 2.8*  --   CL 101  --   CO2 19*  --   GLUCOSE 136*  --   BUN 24*  --   CREATININE 1.53*  --   CALCIUM 8.1*  --   MG  --  1.6*   Liver Function Tests: Recent Labs  Lab 12/18/21 2118  AST 100*  ALT 41  ALKPHOS 96  BILITOT 2.9*  PROT 5.8*  ALBUMIN 2.6*   No results for input(s): "LIPASE", "AMYLASE" in the last 168 hours. Recent Labs  Lab 12/18/21 2119  AMMONIA 150*   CBC: Recent Labs  Lab 12/18/21 1830  WBC 4.7  HGB 8.6*  HCT 25.6*  MCV 88.0  PLT 141*   Cardiac Enzymes: No results for input(s): "CKTOTAL", "CKMB", "CKMBINDEX", "TROPONINI" in the last 168  hours.  BNP (last 3 results) No results for input(s): "BNP" in the last 8760 hours.  ProBNP (last 3 results) No results for input(s): "PROBNP" in the last 8760 hours.  CBG: No results for input(s): "GLUCAP" in the last 168 hours.  Radiological Exams on Admission: DG Chest Port 1 View  Result Date: 12/18/2021 CLINICAL DATA:  Shortness of breath EXAM: PORTABLE CHEST 1 VIEW COMPARISON:  Chest x-ray 11/22/2021 FINDINGS: The heart size and mediastinal contours are within normal limits. Both lungs are clear. The visualized skeletal structures are unremarkable. IMPRESSION: No active disease. Electronically Signed   By: Darliss Cheney M.D.   On: 12/18/2021 21:49    EKG: I independently viewed the EKG done and my findings are as followed: Normal sinus rhythm at rate of 76 bpm with QTc 529 ms   Assessment/Plan Present on Admission:  Ascites due to alcoholic cirrhosis (HCC)  Hypoalbuminemia  Hypokalemia  Hyponatremia  Intractable nausea and vomiting  Alcoholic cirrhosis of liver with ascites (HCC)  Prolonged QT interval  Tobacco use disorder  Principal Problem:   Ascites due to alcoholic cirrhosis (HCC) Active Problems:   Hypokalemia   Intractable nausea and vomiting   Prolonged QT interval   Alcoholic cirrhosis of liver with ascites (HCC)   Hyponatremia   Hypoalbuminemia   Tobacco use disorder   Generalized weakness   AKI (acute kidney injury) (HCC)   Hyperammonemia (HCC)   Hypomagnesemia  Ascites due to alcoholic cirrhosis Paracentesis in the morning Continue lactulose Continue albumin  Generalized weakness Continue fall precaution PT/OT eval and treat  Nausea and vomiting Continue Compazine as needed  Hyponatremia Na 129, continue albumin 50 g every 8 hours  Hypokalemia K+ 2.8, this was replenished in the ED  Hypomagnesemia Magnesium 1.6, this was replenished  Prolonged QT interval QTc 529 ms Avoid QT prolonging drugs Magnesium and potassium were  replenished Repeat EKG in the morning  Hyperammonemia Ammonia 150, continue lactulose  Hypoalbuminemia possibly secondary to moderate protein calorie malnutrition Protein supplement will be provided  Thrombocytopenia Platelets 141, continue to monitor platelets with morning labs  Acute kidney injury BUN/creatinine 24/1.53 (baseline creatinine at 0.7-0.9) Renally adjust medications, avoid nephrotoxic agents/dehydration/hypotension  GERD Continue protonix  Tobacco use disorder Patient will be counseled on tobacco use  cessation when more awake  DVT prophylaxis: SCDs   Code Status: Full code   Family Communication: None at bedside   Consults: IR (paracentesis)  Severity of Illness: The appropriate patient status for this patient is INPATIENT. Inpatient status is judged to be reasonable and necessary in order to provide the required intensity of service to ensure the patient's safety. The patient's presenting symptoms, physical exam findings, and initial radiographic and laboratory data in the context of their chronic comorbidities is felt to place them at high risk for further clinical deterioration. Furthermore, it is not anticipated that the patient will be medically stable for discharge from the hospital within 2 midnights of admission.   * I certify that at the point of admission it is my clinical judgment that the patient will require inpatient hospital care spanning beyond 2 midnights from the point of admission due to high intensity of service, high risk for further deterioration and high frequency of surveillance required.*  Author: Bernadette Hoit, DO 12/19/2021 1:10 AM  For on call review [email or web address].

## 2021-12-18 NOTE — ED Provider Notes (Signed)
[facility] EMERGENCY DEPARTMENT Provider Note   CSN: [id number] Arrival date & time: 12/18/21  1711     History  Chief Complaint  Patient presents with   Weakness    Arbell MELEAH DEMEYER is a 41 y.o. female.  With a history of iron deficiency anemia, cirrhosis, neuropathy who presents ED for evaluation of weakness, nausea and vomiting.  She states she woke up yesterday with the symptoms and they have persisted.  Have not changed throughout that time.  She states her nausea and vomiting, only after she tries to eat.  Has not been able to eat anything since yesterday morning.  States she was admitted for similar symptoms twice within the past month.  Also complaining of shortness of breath at rest.  States she frequently gets abdominal ascites secondary to her cirrhosis.  She last had a paracentesis 8 days ago she and was told to call the office if she needed another.  She states her abdomen has become more distended over the past 3 days.  States there is some discomfort in the right side, but no true abdominal pain.  Has not missed any doses of her medications.  Does not drink alcohol.  States she is a half pack-a-day smoker for the past 25 years.  Also smokes marijuana infrequently.  Last use was 1.5 weeks ago.  Denies dizziness, lightheadedness, chest pain, cough, hematemesis, diarrhea, melena, hematochezia, fevers.   Weakness Associated symptoms: shortness of breath        Home Medications Prior to Admission medications   Medication Sig Start Date End Date Taking? Authorizing Provider  CONSTULOSE 10 GM/15ML solution Take 20 g by mouth 3 (three) times daily. Patient not taking: Reported on 11/23/2021 10/16/21   [provider]  famotidine (PEPCID) 40 MG tablet Take 1 tablet (40 mg total) by mouth at bedtime. Patient not taking: Reported on 11/23/2021 10/31/21   Letta Median, PA-C  furosemide (LASIX) 40 MG tablet Take 1 tablet (40 mg total) by mouth daily. 08/24/21   Letta Median, PA-C  gabapentin (NEURONTIN) 300 MG capsule Take 300 mg by mouth 3 (three) times daily as needed (nerve pain). 02/25/21   [provider]  hydrocortisone (ANUSOL-HC) 2.5 % rectal cream Place 1 Application rectally 2 (two) times daily. 10/31/21   Letta Median, PA-C  hydrOXYzine (VISTARIL) 25 MG capsule Take 25 mg by mouth 3 (three) times daily as needed for anxiety. 11/13/21   [provider]  metoCLOPramide (REGLAN) 10 MG tablet Take 1 tablet (10 mg total) by mouth every 8 (eight) hours as needed for nausea or vomiting. 12/03/21   Gloris Manchester, MD  ondansetron (ZOFRAN) 4 MG tablet Take 1 tablet (4 mg total) by mouth every 8 (eight) hours as needed for nausea or vomiting. 08/24/21 08/24/22  Letta Median, PA-C  ondansetron (ZOFRAN-ODT) 4 MG disintegrating tablet Take 1 tablet (4 mg total) by mouth every 8 (eight) hours as needed for nausea or vomiting. 12/03/21   Gloris Manchester, MD  pantoprazole (PROTONIX) 40 MG tablet Take 1 tablet (40 mg total) by mouth 2 (two) times daily before a meal. 03/30/21   Erick Blinks, MD  propranolol (INDERAL) 10 MG tablet Take 10 mg by mouth daily. 11/29/21   [provider]  spironolactone (ALDACTONE) 100 MG tablet Take 1 tablet (100 mg total) by mouth daily. 08/24/21   Letta Median, PA-C      Allergies    Demerol    Review of Systems  Review of Systems  Respiratory:  Positive for shortness of breath.   Gastrointestinal:  Positive for abdominal distention.  Neurological:  Positive for weakness.  All other systems reviewed and are negative.   Physical Exam Updated Vital Signs BP (!) 148/86   Pulse 74   Temp 97.6 F (36.4 C)   Resp 13   Ht 5\' 6"  (1.676 m)   Wt 68.5 kg   LMP  (LMP Unknown)   SpO2 100%   BMI 24.37 kg/m  Physical Exam Vitals and nursing note reviewed.  Constitutional:      General: She is not in acute distress.    Appearance: Normal appearance. She is well-developed.  HENT:     Head:  Normocephalic and atraumatic.  Eyes:     General: Scleral icterus present.     Extraocular Movements: Extraocular movements intact.     Conjunctiva/sclera: Conjunctivae normal.     Pupils: Pupils are equal, round, and reactive to light.  Cardiovascular:     Rate and Rhythm: Normal rate and regular rhythm.     Pulses: Normal pulses.     Heart sounds: No murmur heard. Pulmonary:     Effort: Pulmonary effort is normal. No respiratory distress.     Breath sounds: Normal breath sounds.  Abdominal:     General: Abdomen is protuberant. There is distension.     Palpations: Abdomen is soft. There is fluid wave.     Tenderness: There is no abdominal tenderness.  Musculoskeletal:        General: No swelling.     Cervical back: Normal range of motion and neck supple.  Skin:    General: Skin is warm and dry.     Capillary Refill: Capillary refill takes less than 2 seconds.  Neurological:     Mental Status: She is alert.  Psychiatric:        Mood and Affect: Mood normal.     ED Results / Procedures / Treatments   Labs (all labs ordered are listed, but only abnormal results are displayed) Labs Reviewed  CBC - Abnormal; Notable for the following components:      Result Value   RBC 2.91 (*)    Hemoglobin 8.6 (*)    HCT 25.6 (*)    RDW 16.7 (*)    Platelets 141 (*)    All other components within normal limits  BASIC METABOLIC PANEL - Abnormal; Notable for the following components:   Sodium 129 (*)    Potassium 2.8 (*)    CO2 19 (*)    Glucose, Bld 136 (*)    BUN 24 (*)    Creatinine, Ser 1.53 (*)    Calcium 8.1 (*)    GFR, Estimated 44 (*)    All other components within normal limits  HEPATIC FUNCTION PANEL - Abnormal; Notable for the following components:   Total Protein 5.8 (*)    Albumin 2.6 (*)    AST 100 (*)    Total Bilirubin 2.9 (*)    Bilirubin, Direct 1.1 (*)    Indirect Bilirubin 1.8 (*)    All other components within normal limits  MAGNESIUM - Abnormal; Notable  for the following components:   Magnesium 1.6 (*)    All other components within normal limits  AMMONIA - Abnormal; Notable for the following components:   Ammonia 150 (*)    All other components within normal limits  PROTIME-INR - Abnormal; Notable for the following components:   Prothrombin Time 19.5 (*)  INR 1.7 (*)    All other components within normal limits  RESP PANEL BY RT-PCR (FLU A&B, COVID) [id number]  URINALYSIS, ROUTINE W REFLEX MICROSCOPIC    EKG EKG Interpretation  Date/Time:  Tuesday December 18 2021 18:55:42 EST Ventricular Rate:  76 PR Interval:  143 QRS Duration: 88 QT Interval:  470 QTC Calculation: 529 R Axis:   29 Text Interpretation: Sinus rhythm Borderline T abnormalities, anterior leads Prolonged QT interval New prolonged QT Confirmed by Vanetta Mulders [phone]) on 12/18/2021 6:59:15 PM  Radiology DG Chest Port 1 View  Result Date: 12/18/2021 CLINICAL DATA:  Shortness of breath EXAM: PORTABLE CHEST 1 VIEW COMPARISON:  Chest x-ray 11/22/2021 FINDINGS: The heart size and mediastinal contours are within normal limits. Both lungs are clear. The visualized skeletal structures are unremarkable. IMPRESSION: No active disease. Electronically Signed   By: Darliss Cheney M.D.   On: 12/18/2021 21:49    Procedures Procedures    Medications Ordered in ED Medications  potassium chloride 10 mEq in 100 mL IVPB (10 mEq Intravenous New Bag/Given 12/18/21 2227)  magnesium sulfate IVPB 2 g 50 mL (2 g Intravenous New Bag/Given 12/18/21 2228)  lactulose (CHRONULAC) 10 GM/15ML solution 20 g (has no administration in time range)  albumin human 25 % solution 50 g (has no administration in time range)    ED Course/ Medical Decision Making/ A&P Clinical Course as of 12/18/21 2335  Tue Dec 18, 2021  2226 Spoke with Dr. Levon Hedger with GI, he recommends repletion of electrolytes and albumin 50 g every 8 hours for hyponatremia and ascites.  Also recommends ultrasound-guided  paracentesis for both therapeutic and diagnostic purposes. [AS]  2331 Spoke with hospitalist Dr. Arther Abbott, he will admit [AS]    Clinical Course User Index [AS] Kieran Arreguin, Edsel Petrin, PA-C                           Medical Decision Making Amount and/or Complexity of Data Reviewed Labs: ordered. Radiology: ordered.  This patient presents to the ED for concern of weakness, this involves an extensive number of treatment options, and is a complaint that carries with it a high risk of complications and morbidity.  The differential diagnosis of weakness includes but is not limited to neurologic causes (GBS, myasthenia gravis, CVA, MS, ALS, transverse myelitis, spinal cord injury, CVA, botulism, ) and other causes: ACS, Arrhythmia, syncope, orthostatic hypotension, sepsis, hypoglycemia, electrolyte disturbance, hypothyroidism, respiratory failure, symptomatic anemia, dehydration, heat injury, polypharmacy, malignancy.    Co morbidities that complicate the patient evaluation  iron deficiency anemia, cirrhosis, neuropathy  My initial workup includes   Additional history obtained from: Nursing notes from this visit. Previous records within EMR system ED visit on 12/03/2021 for nausea.  Patient was discharged  I ordered, reviewed and interpreted labs which include: CMP, CBC, INR, respiratory panel, magnesium, ammonia, urinalysis   I ordered imaging studies including chest x-ray I independently visualized and interpreted imaging which showed normal I agree with the radiologist interpretation  Cardiac Monitoring:  The patient was maintained on a cardiac monitor.  I personally viewed and interpreted the cardiac monitored which showed an underlying rhythm of: NSR with QT prolongation  Consultations Obtained:  I requested consultation with the GI Dr. Levon Hedger,  and discussed lab and imaging findings as well as pertinent plan - they recommend: See ED course  Afebrile, hemodynamically stable.   41 year old female presenting to the ED for evaluation of weakness.  On physical exam patient  has a protuberant abdomen with positive fluid wave..  She also has scleral icterus.  These are both secondary to her alcohol induced cirrhosis.  Lab workup significant for hypomagnesemia, hypokalemia, hyponatremia, elevated ammonia.,  Stable anemia, acute kidney injury with creatinine of 1.53 and BUN of 24.Marland Kitchen  Hyponatremia was addressed with albumin, magnesium and potassium were replenished.  Elevated ammonia was addressed with lactulose.  Patient is unable to get IV fluids at this time and we will delay treating the acute kidney injury until after paracentesis per GI.  Patient will be admitted for acute kidney injury, multiple electrolyte derangements, and generalized weakness.  She has no pain at this time.  She was complaining of nausea, however does have a prolonged QT on her EKG.  Gave Benadryl for her nausea due to this.  Stable at the time of admission.  Patient's case discussed with Dr. Deretha Emory who agrees with plan to discharge with follow-up.   Note: Portions of this report may have been transcribed using voice recognition software. Every effort was made to ensure accuracy; however, inadvertent computerized transcription errors may still be present.          Final Clinical Impression(s) / ED Diagnoses Final diagnoses:  Generalized weakness  Ascites due to alcoholic cirrhosis (HCC)  Hyponatremia  Hypomagnesemia  Symptomatic anemia    Rx / DC Orders ED Discharge Orders     None         Michelle Piper, PA-C 12/18/21 2340    Vanetta Mulders, MD 12/19/21 [phone]

## 2021-12-19 ENCOUNTER — Inpatient Hospital Stay (HOSPITAL_COMMUNITY): Payer: Medicaid Other

## 2021-12-19 DIAGNOSIS — E7220 Disorder of urea cycle metabolism, unspecified: Secondary | ICD-10-CM | POA: Insufficient documentation

## 2021-12-19 DIAGNOSIS — E871 Hypo-osmolality and hyponatremia: Secondary | ICD-10-CM | POA: Diagnosis not present

## 2021-12-19 DIAGNOSIS — K7031 Alcoholic cirrhosis of liver with ascites: Secondary | ICD-10-CM | POA: Diagnosis not present

## 2021-12-19 DIAGNOSIS — R531 Weakness: Principal | ICD-10-CM

## 2021-12-19 DIAGNOSIS — E876 Hypokalemia: Secondary | ICD-10-CM | POA: Diagnosis not present

## 2021-12-19 DIAGNOSIS — N179 Acute kidney failure, unspecified: Secondary | ICD-10-CM | POA: Insufficient documentation

## 2021-12-19 LAB — ALBUMIN, PLEURAL OR PERITONEAL FLUID: Albumin, Fluid: <1.5 g/dL

## 2021-12-19 LAB — COMPREHENSIVE METABOLIC PANEL
ALT: 36 U/L (ref 0–44)
AST: 89 U/L — ABNORMAL HIGH (ref 15–41)
Albumin: 2.7 g/dL — ABNORMAL LOW (ref 3.5–5.0)
Alkaline Phosphatase: 85 U/L (ref 38–126)
Anion gap: 6 (ref 5–15)
BUN: 20 mg/dL (ref 6–20)
CO2: 19 mmol/L — ABNORMAL LOW (ref 22–32)
Calcium: 7.9 mg/dL — ABNORMAL LOW (ref 8.9–10.3)
Chloride: 106 mmol/L (ref 98–111)
Creatinine, Ser: 1.33 mg/dL — ABNORMAL HIGH (ref 0.44–1.00)
GFR, Estimated: 52 mL/min — ABNORMAL LOW (ref >60)
Glucose, Bld: 114 mg/dL — ABNORMAL HIGH (ref 70–99)
Potassium: 3.4 mmol/L — ABNORMAL LOW (ref 3.5–5.1)
Sodium: 131 mmol/L — ABNORMAL LOW (ref 135–145)
Total Bilirubin: 2.5 mg/dL — ABNORMAL HIGH (ref 0.3–1.2)
Total Protein: 5.5 g/dL — ABNORMAL LOW (ref 6.5–8.1)

## 2021-12-19 LAB — GRAM STAIN

## 2021-12-19 LAB — BODY FLUID CELL COUNT WITH DIFFERENTIAL
Lymphs, Fluid: 71 %
Monocyte-Macrophage-Serous Fluid: 25 % — ABNORMAL LOW (ref 50–90)
Neutrophil Count, Fluid: 4 % (ref 0–25)
Total Nucleated Cell Count, Fluid: 116 cu mm (ref 0–1000)

## 2021-12-19 LAB — CBC
HCT: 20.8 % — ABNORMAL LOW (ref 36.0–46.0)
Hemoglobin: 7 g/dL — ABNORMAL LOW (ref 12.0–15.0)
MCH: 29.7 pg (ref 26.0–34.0)
MCHC: 33.7 g/dL (ref 30.0–36.0)
MCV: 88.1 fL (ref 80.0–100.0)
Platelets: 107 10*3/uL — ABNORMAL LOW (ref 150–400)
RBC: 2.36 MIL/uL — ABNORMAL LOW (ref 3.87–5.11)
RDW: 16.7 % — ABNORMAL HIGH (ref 11.5–15.5)
WBC: 4.1 10*3/uL (ref 4.0–10.5)
nRBC: 0 % (ref 0.0–0.2)

## 2021-12-19 LAB — PROTEIN, PLEURAL OR PERITONEAL FLUID: Total protein, fluid: <3 g/dL

## 2021-12-19 LAB — LACTATE DEHYDROGENASE, PLEURAL OR PERITONEAL FLUID: LD, Fluid: <25 U/L — ABNORMAL HIGH (ref 3–23)

## 2021-12-19 LAB — MAGNESIUM: Magnesium: 2.3 mg/dL (ref 1.7–2.4)

## 2021-12-19 LAB — PHOSPHORUS: Phosphorus: 2.6 mg/dL (ref 2.5–4.6)

## 2021-12-19 MED ORDER — METOCLOPRAMIDE HCL 5 MG/ML IJ SOLN
10.0000 mg | Freq: Once | INTRAMUSCULAR | Status: AC
  Administered 2021-12-20: 10 mg via INTRAVENOUS
  Filled 2021-12-19: qty 2

## 2021-12-19 MED ORDER — GABAPENTIN 300 MG PO CAPS
300.0000 mg | ORAL_CAPSULE | Freq: Three times a day (TID) | ORAL | Status: DC
  Administered 2021-12-19 – 2021-12-21 (×6): 300 mg via ORAL
  Filled 2021-12-19 (×6): qty 1

## 2021-12-19 MED ORDER — LACTULOSE 10 GM/15ML PO SOLN
20.0000 g | Freq: Three times a day (TID) | ORAL | Status: DC
  Administered 2021-12-19: 20 g via ORAL
  Filled 2021-12-19: qty 30

## 2021-12-19 MED ORDER — OXYCODONE HCL 5 MG PO TABS
5.0000 mg | ORAL_TABLET | Freq: Once | ORAL | Status: AC
  Administered 2021-12-19: 5 mg via ORAL
  Filled 2021-12-19: qty 1

## 2021-12-19 MED ORDER — ALBUMIN HUMAN 25 % IV SOLN
INTRAVENOUS | Status: AC
  Filled 2021-12-19: qty 100

## 2021-12-19 MED ORDER — PANTOPRAZOLE SODIUM 40 MG IV SOLR
40.0000 mg | INTRAVENOUS | Status: DC
  Administered 2021-12-19 – 2021-12-22 (×4): 40 mg via INTRAVENOUS
  Filled 2021-12-19 (×4): qty 10

## 2021-12-19 MED ORDER — METHOCARBAMOL 1000 MG/10ML IJ SOLN
500.0000 mg | Freq: Once | INTRAVENOUS | Status: AC
  Administered 2021-12-20: 500 mg via INTRAVENOUS
  Filled 2021-12-19: qty 5

## 2021-12-19 MED ORDER — TRAMADOL HCL 50 MG PO TABS
50.0000 mg | ORAL_TABLET | Freq: Four times a day (QID) | ORAL | Status: DC | PRN
  Administered 2021-12-19 – 2021-12-21 (×2): 50 mg via ORAL
  Filled 2021-12-19 (×2): qty 1

## 2021-12-19 MED ORDER — SODIUM CHLORIDE 0.9 % IV SOLN
2.0000 g | INTRAVENOUS | Status: DC
  Administered 2021-12-19 – 2021-12-21 (×3): 2 g via INTRAVENOUS
  Filled 2021-12-19 (×3): qty 20

## 2021-12-19 MED ORDER — LACTULOSE 10 GM/15ML PO SOLN
30.0000 g | Freq: Three times a day (TID) | ORAL | Status: DC
  Administered 2021-12-19 – 2021-12-21 (×8): 30 g via ORAL
  Filled 2021-12-19 (×9): qty 60

## 2021-12-19 MED ORDER — POTASSIUM CHLORIDE CRYS ER 20 MEQ PO TBCR
40.0000 meq | EXTENDED_RELEASE_TABLET | ORAL | Status: AC
  Administered 2021-12-19 – 2021-12-20 (×2): 40 meq via ORAL
  Filled 2021-12-19 (×2): qty 2

## 2021-12-19 MED ORDER — PROPRANOLOL HCL 20 MG PO TABS
10.0000 mg | ORAL_TABLET | Freq: Every day | ORAL | Status: DC
  Filled 2021-12-19 (×4): qty 1

## 2021-12-19 MED ORDER — PROCHLORPERAZINE EDISYLATE 10 MG/2ML IJ SOLN
10.0000 mg | Freq: Four times a day (QID) | INTRAMUSCULAR | Status: DC | PRN
  Administered 2021-12-19 (×2): 10 mg via INTRAVENOUS
  Filled 2021-12-19 (×3): qty 2

## 2021-12-19 NOTE — Progress Notes (Signed)
Paracentesis complete no signs of distress.  

## 2021-12-19 NOTE — Evaluation (Signed)
Occupational Therapy Evaluation Patient Details Name: Erica Banks MRN: [id number] DOB: 03-08-1980 Today's Date: 12/19/2021   History of Present Illness Erica Banks is a 41 y.o. female with medical history significant of alcoholic cirrhosis, DVT, neuropathy, pulmonary embolism, GERD, iron deficiency anemia who presents to the emergency department due to weakness, nausea and vomiting.  She endorsed 1 week of nausea on 2 days of nonbloody vomiting, but was unable to provide further history due to feeling sleepy.  Rest of history was obtained from ED PA and ED medical record.  Per report, patient complained of worsening nausea and vomiting after eating and she has not eaten anything since yesterday morning (12/17/2021).  She complained of shortness of breath at rest, she states that last paracentesis was 8 days ago.  Abdomen has become distended within the past 3 days with some right-sided discomfort.  Patient reported that she has been compliant with her medications and has since abstained from drinking alcohol.  She has 25 pack year history of smoking and occasionally uses marijuana (last use was 1.5 weeks ago).  She denies chest pain, fever, lightheadedness, dizziness or diarrhea. (Per DO)   Clinical Impression   Pt agreeable to OT and PT co-evaluation. Pt demonstrates good B UE strength and independent seated ADL tasks. Supervision assist needed initially during standing due to pt being mildly unsteady, but with increased walking the pt demonstrated better and better balance. Pt is not recommended for further acute OT services and will be discharged to care of nursing staff for remaining length of stay.       Recommendations for follow up therapy are one component of a multi-disciplinary discharge planning process, led by the attending physician.  Recommendations may be updated based on patient status, additional functional criteria and insurance authorization.   Follow Up Recommendations   No OT follow up     Assistance Recommended at Discharge PRN  Patient can return home with the following      Functional Status Assessment  Patient has not had a recent decline in their functional status  Equipment Recommendations  None recommended by OT    Recommendations for Other Services       Precautions / Restrictions Precautions Precautions: Fall Restrictions Weight Bearing Restrictions: No      Mobility Bed Mobility Overal bed mobility: Independent                  Transfers Overall transfer level: Needs assistance   Transfers: Sit to/from Stand, Bed to chair/wheelchair/BSC Sit to Stand: Independent     Step pivot transfers: Supervision, Independent     General transfer comment: Mildly unsteady at first during ambulation but improved to indepndent by end of walk in hall with ability to roll IV pole on her own.      Balance Overall balance assessment: Mild deficits observed, not formally tested                                         ADL either performed or assessed with clinical judgement   ADL Overall ADL's : Independent                                       General ADL Comments: Pt able to don socks at EOB without issue. Good functional transfers.  Vision Baseline Vision/History: 1 Wears glasses Ability to See in Adequate Light: 0 Adequate Patient Visual Report: No change from baseline Vision Assessment?: No apparent visual deficits     Perception     Praxis      Pertinent Vitals/Pain Pain Assessment Pain Assessment: 0-10 Pain Score: 4  Pain Location: stomach Pain Descriptors / Indicators: Discomfort Pain Intervention(s): Limited activity within patient's tolerance, Monitored during session, Repositioned     Hand Dominance Right   Extremity/Trunk Assessment Upper Extremity Assessment Upper Extremity Assessment: Overall WFL for tasks assessed   Lower Extremity Assessment Lower Extremity  Assessment: Defer to PT evaluation   Cervical / Trunk Assessment Cervical / Trunk Assessment: Normal   Communication Communication Communication: No difficulties   Cognition Arousal/Alertness: Awake/alert Behavior During Therapy: WFL for tasks assessed/performed Overall Cognitive Status: Within Functional Limits for tasks assessed                                                  Home Living Family/patient expects to be discharged to:: Private residence Living Arrangements: Spouse/significant other;Children Available Help at Discharge: Family;Available PRN/intermittently Type of Home: House Home Access: Stairs to enter [employer] of Steps: 4 to 5 Entrance Stairs-Rails: Can reach both Home Layout: One level     Bathroom Shower/Tub: Chief Strategy Officer: Standard Bathroom Accessibility: Yes How Accessible: Accessible via walker Home Equipment: None          Prior Functioning/Environment Prior Level of Function : Independent/Modified Independent             Mobility Comments: Tourist information centre manager without AD ADLs Comments: Independent                                Co-evaluation PT/OT/SLP Co-Evaluation/Treatment: Yes Reason for Co-Treatment: To address functional/ADL transfers   OT goals addressed during session: ADL's and self-care                       End of Session    Activity Tolerance: Patient tolerated treatment well Patient left: in bed;with call bell/phone within reach  OT Visit Diagnosis: Muscle weakness (generalized) (M62.81)                Time: 1102-1117 OT Time Calculation (min): 13 min Charges:  OT General Charges $OT Visit: 1 Visit OT Evaluation $OT Eval Low Complexity: 1 Low  Gaven Eugene OT, MOT   Danie Chandler 12/19/2021, 9:59 AM

## 2021-12-19 NOTE — TOC Initial Note (Signed)
Transition of Care [facility]) - Initial/Assessment Note    Patient Details  Name: Erica Banks MRN: [id number] Date of Birth: [birth year]  Transition of [facility]) CM/SW Contact:    Villa Herb, LCSWA Phone Number: 12/19/2021, 8:55 AM  Clinical Narrative:                 Pt is high risk for readmission. CSW spoke with pt to complete assessment. Pt lives with her spouse. Pt states that she is independent in completing her ADLs. Pt states if she is unable to provide her own transportation family will assist. Pt does not use any DME in the home. TOC to follow.   Expected Discharge Plan: Home/Self Care Barriers to Discharge: Continued Medical Work up   Patient Goals and CMS Choice Patient states their goals for this hospitalization and ongoing recovery are:: return home CMS Medicare.gov Compare Post Acute Care list provided to:: Patient Choice offered to / list presented to : Patient  Expected Discharge Plan and Services Expected Discharge Plan: Home/Self Care In-house Referral: Clinical Social Work Discharge Planning Services: CM Consult   Living arrangements for the past 2 months: Single Family Home                                      Prior Living Arrangements/Services Living arrangements for the past 2 months: Single Family Home Lives with:: Spouse Patient language and need for interpreter reviewed:: Yes Do you feel safe going back to the place where you live?: Yes      Need for Family Participation in Patient Care: Yes (Comment) Care giver support system in place?: Yes (comment)   Criminal Activity/Legal Involvement Pertinent to Current Situation/Hospitalization: No - Comment as needed  Activities of Daily Living      Permission Sought/Granted                  Emotional Assessment Appearance:: Appears stated age Attitude/Demeanor/Rapport: Engaged Affect (typically observed): Accepting Orientation: : Oriented to Self, Oriented to Place, Oriented  to  Time, Oriented to Situation Alcohol / Substance Use: Not Applicable Psych Involvement: No (comment)  Admission diagnosis:  Hypomagnesemia [E83.42] Hyponatremia [E87.1] Generalized weakness [R53.1] Symptomatic anemia [D64.9] Ascites due to alcoholic cirrhosis (HCC) [K70.31] Patient Active Problem List   Diagnosis Date Noted   Generalized weakness 12/19/2021   AKI (acute kidney injury) (HCC) 12/19/2021   Hyperammonemia (HCC) 12/19/2021   Hypomagnesemia 12/19/2021   Ascites due to alcoholic cirrhosis (HCC) 12/18/2021   Hypocalcemia 11/23/2021   Hypoalbuminemia 11/23/2021   Tobacco use disorder 11/23/2021   Esophageal varices without bleeding (HCC) 10/31/2021   Heartburn 08/23/2021   Nausea without vomiting 08/23/2021   Lesion of liver less than 1 cm in diameter    Acute blood loss anemia 03/27/2021   Alcoholic cirrhosis of liver with ascites (HCC) 03/27/2021   Hyponatremia 03/27/2021   Iron deficiency anemia 03/27/2021   Folate deficiency 03/27/2021   Ascites    Cirrhosis of liver with ascites (HCC)    Intractable nausea and vomiting 07/20/2019   Cholelithiasis 07/20/2019   Prolonged QT interval 07/20/2019   Anemia 01/01/2015   Symptomatic anemia 01/01/2015   Hypokalemia 01/01/2015   Acute gastroenteritis 01/01/2015   Abnormal CT of the abdomen 06/08/2013   Gastroesophageal reflux disease 06/08/2013   PCP:  Toma Deiters, MD Pharmacy:   [facility] [place] - [address] [place] [address] Phone: [phone] Fax: [phone]     Social Determinants of Health (SDOH) Interventions    Readmission Risk Interventions    12/19/2021    8:54 AM  Readmission Risk Prevention Plan  Transportation Screening Complete  HRI or Home Care Consult Complete  Social Work Consult for Recovery Care Planning/Counseling Complete  Palliative Care Screening Not Applicable  Medication Review Oceanographer) Complete

## 2021-12-19 NOTE — Progress Notes (Signed)
PROGRESS NOTE    Erica Banks  PVX:[id number] DOB: 1980/09/30 DOA: 12/18/2021 PCP: Toma Deiters, MD    Brief Narrative:  41 year old female with a history of alcoholic cirrhosis, admitted to the hospital with abdominal distention, electrolyte derangements and acute kidney injury.  She underwent paracentesis.  GI consulted.  Electrolytes are being replaced.  Ammonia level significantly elevated, continued on lactulose.  Started on Rocephin for prophylactic treatment of SBP pending results of paracentesis.   Assessment & Plan:   Principal Problem:   Ascites due to alcoholic cirrhosis (HCC) Active Problems:   Hypokalemia   Intractable nausea and vomiting   Prolonged QT interval   Alcoholic cirrhosis of liver with ascites (HCC)   Hyponatremia   Hypoalbuminemia   Tobacco use disorder   Generalized weakness   AKI (acute kidney injury) (HCC)   Hyperammonemia (HCC)   Hypomagnesemia   Ascites due to alcoholic cirrhosis -Status post paracentesis removal of 1.7 L of fluid -Fluid studies in process -Started on empiric ceftriaxone -Appreciate GI following -Continued on propranolol -Diuretics currently on hold due to AKI  Hyponatremia -Likely related to cirrhosis, poor p.o. intake -Overall improving  Hypokalemia -Improving with replacement  Hypomagnesemia -Replaced  Hyperammonemia -Reports compliance with lactulose at home -Overall doses been increased since admission -Considering initiation of Xifaxan  Acute kidney injury -Creatinine 1.5 on admission, baseline 0.7-0.9 -Mild improvement today, continue to follow  Generalized weakness -Seen by PT/OT with no further follow-up recommended  Anemia -Has been getting iron infusions as an outpatient -Hemoglobin down to 7.0 -Recheck in a.m., if still low will give another unit of PRBC -No reported bleeding -Can consider giving iron infusion in the hospital    DVT prophylaxis: SCDs Start: 12/19/21 0041  Code  Status: Full code Family Communication: No family present Disposition Plan: Status is: Inpatient Remains inpatient appropriate because: Continued workup of ascites and need for IV to biotics     Consultants:  Gastroenterology  Procedures:  Paracentesis 11/22 with removal of 1.7 L of fluid  Antimicrobials:  Ceftriaxone 11/22 >   Subjective: She has some abdominal discomfort.  Currently on room air.  Objective: Vitals:   12/19/21 0858 12/19/21 1200 12/19/21 1242 12/19/21 1255  BP: 138/69 108/62 124/63 121/60  Pulse:  86 76 73  Resp: 18 20 20 16   Temp: 98.1 F (36.7 C)   97.8 F (36.6 C)  TempSrc:    Oral  SpO2: 100% 100% 100% 100%  Weight:      Height:        Intake/Output Summary (Last 24 hours) at 12/19/2021 1732 Last data filed at 12/19/2021 0459 Gross per 24 hour  Intake 520.56 ml  Output --  Net 520.56 ml   Filed Weights   12/18/21 1744 12/19/21 0016  Weight: 68.5 kg 66.1 kg    Examination:  General exam: Appears calm and comfortable  Respiratory system: Clear to auscultation. Respiratory effort normal. Cardiovascular system: S1 & S2 heard, RRR. No JVD, murmurs, rubs, gallops or clicks. No pedal edema. Gastrointestinal system: Abdomen is distended, soft and nontender. No organomegaly or masses felt. Normal bowel sounds heard. Central nervous system: Alert and oriented. No focal neurological deficits. Extremities: Symmetric 5 x 5 power. Skin: No rashes, lesions or ulcers Psychiatry: Judgement and insight appear normal. Mood & affect appropriate.     Data Reviewed: I have personally reviewed following labs and imaging studies  CBC: Recent Labs  Lab 12/18/21 1830 12/19/21 0254  WBC 4.7 4.1  HGB 8.6* 7.0*  HCT 25.6* 20.8*  MCV 88.0 88.1  PLT 141* 107*   Basic Metabolic Panel: Recent Labs  Lab 12/18/21 1830 12/18/21 2118 12/19/21 0254  NA 129*  --  131*  K 2.8*  --  3.4*  CL 101  --  106  CO2 19*  --  19*  GLUCOSE 136*  --  114*  BUN  24*  --  20  CREATININE 1.53*  --  1.33*  CALCIUM 8.1*  --  7.9*  MG  --  1.6* 2.3  PHOS  --   --  2.6   GFR: Estimated Creatinine Clearance: 52.6 mL/min (A) (by C-G formula based on SCr of 1.33 mg/dL (H)). Liver Function Tests: Recent Labs  Lab 12/18/21 2118 12/19/21 0254  AST 100* 89*  ALT 41 36  ALKPHOS 96 85  BILITOT 2.9* 2.5*  PROT 5.8* 5.5*  ALBUMIN 2.6* 2.7*   No results for input(s): "LIPASE", "AMYLASE" in the last 168 hours. Recent Labs  Lab 12/18/21 2119  AMMONIA 150*   Coagulation Profile: Recent Labs  Lab 12/18/21 2118  INR 1.7*   Cardiac Enzymes: No results for input(s): "CKTOTAL", "CKMB", "CKMBINDEX", "TROPONINI" in the last 168 hours. BNP (last 3 results) No results for input(s): "PROBNP" in the last 8760 hours. HbA1C: No results for input(s): "HGBA1C" in the last 72 hours. CBG: No results for input(s): "GLUCAP" in the last 168 hours. Lipid Profile: No results for input(s): "CHOL", "HDL", "LDLCALC", "TRIG", "CHOLHDL", "LDLDIRECT" in the last 72 hours. Thyroid Function Tests: No results for input(s): "TSH", "T4TOTAL", "FREET4", "T3FREE", "THYROIDAB" in the last 72 hours. Anemia Panel: No results for input(s): "VITAMINB12", "FOLATE", "FERRITIN", "TIBC", "IRON", "RETICCTPCT" in the last 72 hours. Sepsis Labs: No results for input(s): "PROCALCITON", "LATICACIDVEN" in the last 168 hours.  Recent Results (from the past 240 hour(s))  Gram stain     Status: None   Collection Time: 12/14/21 10:20 AM   Specimen: Ascitic  Result Value Ref Range Status   Specimen Description ASCITIC  Final   Special Requests NONE  Final   Gram Stain   Final    CYTOSPIN SMEAR NO ORGANISMS SEEN WBC PRESENT,BOTH PMN AND MONONUCLEAR Performed at [facility], [address], [place], [place] [address]    Report Status 12/15/2021 FINAL  Final  Culture, body fluid w Gram Stain-bottle     Status: None (Preliminary result)   Collection Time: 12/14/21 10:20 AM   Specimen:  Ascitic  Result Value Ref Range Status   Specimen Description ASCITIC  Final   Special Requests PERITONEAL BOTTLES DRAWN AEROBIC AND ANAEROBIC  Final   Gram Stain   Final    NO ORGANISMS SEEN CYTOSPIN SMEAR WBC PRESENT,BOTH PMN AND MONONUCLEAR   Culture   Final    NO GROWTH 4 DAYS Performed at [facility], [address] [place], [place] [address]    Report Status PENDING  Incomplete  Resp Panel by RT-PCR (Flu A&B, Covid) Anterior Nasal Swab     Status: None   Collection Time: 12/18/21  9:05 PM   Specimen: Anterior Nasal Swab  Result Value Ref Range Status   SARS Coronavirus 2 by RT PCR NEGATIVE NEGATIVE Final    Comment: (NOTE) SARS-CoV-2 target nucleic acids are NOT DETECTED.  The SARS-CoV-2 RNA is generally detectable in upper respiratory specimens during the acute phase of infection. The lowest concentration of SARS-CoV-2 viral copies this assay can detect is 138 copies/mL. A negative result does not preclude SARS-Cov-2 infection and should not be  used as the sole basis for treatment or other patient management decisions. A negative result may occur with  improper specimen collection/handling, submission of specimen other than nasopharyngeal swab, presence of viral mutation(s) within the areas targeted by this assay, and inadequate number of viral copies(<138 copies/mL). A negative result must be combined with clinical observations, patient history, and epidemiological information. The expected result is Negative.  Fact Sheet for Patients:  BloggerCourse.com  Fact Sheet for Healthcare Providers:  SeriousBroker.it  This test is no t yet approved or cleared by the [place] FDA and  has been authorized for detection and/or diagnosis of SARS-CoV-2 by FDA under an Emergency Use Authorization (EUA). This EUA will remain  in effect (meaning this test can be used) for the duration of the COVID-19 declaration under Section  564(b)(1) of the Act, 21 U.S.C.section 360bbb-3(b)(1), unless the authorization is terminated  or revoked sooner.       Influenza A by PCR NEGATIVE NEGATIVE Final   Influenza B by PCR NEGATIVE NEGATIVE Final    Comment: (NOTE) The Xpert Xpress SARS-CoV-2/FLU/RSV plus assay is intended as an aid in the diagnosis of influenza from Nasopharyngeal swab specimens and should not be used as a sole basis for treatment. Nasal washings and aspirates are unacceptable for Xpert Xpress SARS-CoV-2/FLU/RSV testing.  Fact Sheet for Patients: BloggerCourse.com  Fact Sheet for Healthcare Providers: SeriousBroker.it  This test is not yet approved or cleared by the [place] FDA and has been authorized for detection and/or diagnosis of SARS-CoV-2 by FDA under an Emergency Use Authorization (EUA). This EUA will remain in effect (meaning this test can be used) for the duration of the COVID-19 declaration under Section 564(b)(1) of the Act, 21 U.S.C. section 360bbb-3(b)(1), unless the authorization is terminated or revoked.  Performed at [facility], [address], [place], [place] [address]   Gram stain     Status: None   Collection Time: 12/19/21 12:00 PM   Specimen: Peritoneal Washings  Result Value Ref Range Status   Specimen Description PERITONEAL  Final   Special Requests ASCITIES  Final   Gram Stain   Final    NO ORGANISMS SEEN WBC PRESENT,BOTH PMN AND MONONUCLEAR CYTOSPIN SMEAR Performed at [facility], [address], [place], [place] [address]    Report Status 12/19/2021 FINAL  Final         Radiology Studies: US Paracentesis  Result Date: 12/19/2021 INDICATION: Alcohol Cirrhosis; recurrent ascites EXAM: ULTRASOUND GUIDED RLQ PARACENTESIS MEDICATIONS: 10 cc 1% lidocaine COMPLICATIONS: None immediate. PROCEDURE: Informed written consent was obtained from the patient after a discussion of the risks, benefits and alternatives  to treatment. A timeout was performed prior to the initiation of the procedure. Initial ultrasound scanning demonstrates a large amount of ascites within the right lower abdominal quadrant. The right lower abdomen was prepped and draped in the usual sterile fashion. 1% lidocaine was used for local anesthesia. Following this, a Yueh catheter was introduced. An ultrasound image was saved for documentation purposes. The paracentesis was performed. The catheter was removed and a dressing was applied. The patient tolerated the procedure well without immediate post procedural complication. Patient received post-procedure intravenous albumin; see nursing notes for details. FINDINGS: A total of approximately 1.7 liters of yellow fluid was removed. Samples were sent to the laboratory as requested by the clinical team. IMPRESSION: Successful ultrasound-guided paracentesis yielding 1.7 liters of peritoneal fluid. PLAN: The patient has required >/=2 paracenteses in a 30 day period and a formal evaluation by the  [facility] has been arranged. Read by Robet Leu [facility] Electronically Signed   By: Marliss Coots M.D.   On: 12/19/2021 14:09   DG Chest Port 1 View  Result Date: 12/18/2021 CLINICAL DATA:  Shortness of breath EXAM: PORTABLE CHEST 1 VIEW COMPARISON:  Chest x-ray 11/22/2021 FINDINGS: The heart size and mediastinal contours are within normal limits. Both lungs are clear. The visualized skeletal structures are unremarkable. IMPRESSION: No active disease. Electronically Signed   By: Darliss Cheney M.D.   On: 12/18/2021 21:49        Scheduled Meds:  lactulose  30 g Oral TID   pantoprazole (PROTONIX) IV  40 mg Intravenous Q24H   propranolol  10 mg Oral Daily   Continuous Infusions:  albumin human     cefTRIAXone (ROCEPHIN)  IV 2 g (12/19/21 1640)     LOS: 1 day    Time spent: 35 mins    Erick Blinks, MD [facility]   If 7PM-7AM,  please contact night-coverage [email or web address]  12/19/2021, 5:32 PM

## 2021-12-19 NOTE — Procedures (Signed)
   US guided RLQ paracentesis  1.7 Liters yellow fluid obtained Sent for labs per MD  Tolerated well

## 2021-12-19 NOTE — Consult Note (Addendum)
Gastroenterology Consult   Referring Provider: Montez Morita, [place]* Primary Care Physician:  Neale Burly, MD Primary Gastroenterologist:  Cristopher Estimable.Rourk, MD  Patient ID: Erica Banks; [id number]; Sep 13, 1980   Admit date: 12/18/2021  LOS: 1 day   Date of Consultation: 12/19/2021  Reason for Consultation:  cirrhosis  History of Present Illness   Erica Banks is a 41 y.o. year old female with history of alcoholic cirrhosis, DVT, neuropathy, PE, GERD, IDA on scheduled iron infusions presented to the ED due to weakness, nausea, and vomiting.  She had reported nausea and vomiting for 2 days also with some shortness of breath and abdominal distention.  GI consulted given abdominal distention, elevated LFTs, electrolyte derangements, and AKI in the setting of cirrhosis.   ED Course: Labs -hemoglobin 8.6, MCV 88, platelets 141, magnesium 1.6, sodium 129, potassium 2.8, creatinine 1.53, albumin 2.6, AST 100, ALT 41, alk phos 96, T. bili 2.9, GFR 44, BUN 24 Normal UA Respiratory panel negative Chest x-ray with no active disease Patient given lactulose, Benadryl, magnesium, and potassium supplements. Consult with Dr. Jenetta Downer who recommended repletion of electrolytes, albumin 50 g every 8 hours for hyponatremia and ascites and ultrasound paracentesis.   Consult: Began having worsening nausea Sunday/Monday. Reported she was sick prior to that given the fluid on her stomach. Reports she had a para last week and felt better initially after that. Denies any BRBPR, melena. Has had some abdominal pain that comes and goes. Has been having about 1 BM per day at home. Reports she is taking her lactulose 3 times per day. Does report some confusion at home. Has continued to abstain from alcohol. Has not had any peripheral edema. Reflux has been controlled. States she has changed her diet up and has been avoiding sodium as much as possible (no cheesburgers, hotdogs, etc). Has been struggling  with hydration regarding too muhc vs not enough. Drinks about 2 bottles of water a day and drinks 2-3 sodas per day (ginger ale). That is all she is able to tolreate given her nausea.  Has been taking her Lasix and spironolactone as instructed.  Hospitalized 11/23/2021 for symptomatic anemia.  Hemoglobin 7.5, stool Hemoccult negative.  Also had reported some BRBPR related to recent constipation and likely secondary to hemorrhoids.  She received 1 unit PRBCs and iron infusion.  Follows with hematology regarding her anemia.  Was advised to follow-up in the clinic in 3-4 weeks. Abdominal ultrasound for ascites on 11/26/2021 with insufficient amount of ascites for paracentesis.  EGD and colonoscopy in May 2023 with grade 1-2 esophageal varices without bleeding, advanced appearing portal hypertensive gastropathy. Colonoscopy with very large internal and external hemorrhoids, portal colopathy. Repeat recommended in 3 years.   Paracentesis 12/14/2021: Successful removal of 3.3 L of yellow ascitic fluid. -Total nucleated cell count 782, neutrophil count 74, lymphs count 17 (per cell count she meets criteria for SBP however her cultures have no growth at 4 days)  Past Medical History:  Diagnosis Date   Anemia    Cirrhosis ([place])    DVT (deep venous thrombosis) ([facility])    Low iron    Neuropathy    Neuropathy    Panic attacks    Pulmonary embolus ([place]) 2019    Past Surgical History:  Procedure Laterality Date   ABDOMINAL HYSTERECTOMY     BIOPSY N/A 06/24/2013   Procedure: BIOPSY TERMINAL ILEUM;  Surgeon: Daneil Dolin, MD;  Location: [facility];  Service: Endoscopy;  Laterality: N/A;  CHOLECYSTECTOMY     COLONOSCOPY N/A 06/24/2013   XBW:IOMBTDHR hemorrhoids/otherwise normal    COLONOSCOPY WITH PROPOFOL N/A 05/31/2021   Surgeon: Daneil Dolin, MD; ery large nonbleeding internal and external hemorrhoids, portal colopathy.  Recommended repeat colonoscopy in 10 years.    ESOPHAGOGASTRODUODENOSCOPY (EGD) WITH PROPOFOL N/A 03/29/2021   Surgeon: Eloise Harman, DO;  grade 1 esophageal varices, portal hypertensive gastropathy with friable tissue with spontaneous ooze, normal duodenum.   ESOPHAGOGASTRODUODENOSCOPY (EGD) WITH PROPOFOL N/A 05/31/2021   Surgeon: Daneil Dolin, MD;  grade 1-2 esophageal varices without bleeding stigmata, advanced appearing portal hypertensive gastropathy.   nasal bone surgery     NASAL SINUS SURGERY     SINUS IRRIGATION     TONSILLECTOMY     TUBAL LIGATION      Prior to Admission medications   Medication Sig Start Date End Date Taking? Authorizing Provider  furosemide (LASIX) 40 MG tablet Take 1 tablet (40 mg total) by mouth daily. 08/24/21  Yes Aliene Altes S, PA-C  gabapentin (NEURONTIN) 300 MG capsule Take 300 mg by mouth 3 (three) times daily as needed (nerve pain). 02/25/21  Yes [provider]  hydrOXYzine (VISTARIL) 25 MG capsule Take 25 mg by mouth 3 (three) times daily as needed for anxiety. 11/13/21  Yes [provider]  ibuprofen (ADVIL) 800 MG tablet Take 800 mg by mouth every 8 (eight) hours as needed for fever, headache, mild pain or moderate pain. 11/27/21  Yes [provider]  methocarbamol (ROBAXIN) 500 MG tablet Take 500 mg by mouth at bedtime as needed for muscle spasms. 11/29/21  Yes [provider]  metoCLOPramide (REGLAN) 10 MG tablet Take 1 tablet (10 mg total) by mouth every 8 (eight) hours as needed for nausea or vomiting. 12/03/21  Yes Godfrey Pick, MD  ondansetron (ZOFRAN-ODT) 4 MG disintegrating tablet Take 1 tablet (4 mg total) by mouth every 8 (eight) hours as needed for nausea or vomiting. 12/03/21  Yes Godfrey Pick, MD  pantoprazole (PROTONIX) 40 MG tablet Take 1 tablet (40 mg total) by mouth 2 (two) times daily before a meal. 03/30/21  Yes Kathie Dike, MD  spironolactone (ALDACTONE) 100 MG tablet Take 1 tablet (100 mg total) by mouth daily. 08/24/21  Yes Erenest Rasher, PA-C  propranolol (INDERAL) 10 MG tablet Take 10 mg by mouth daily. Patient not taking: Reported on 12/19/2021 11/29/21   [provider]    Current Facility-Administered Medications  Medication Dose Route Frequency Provider Last Rate Last Admin   albumin human 25 % solution            lactulose (CHRONULAC) 10 GM/15ML solution 20 g  20 g Oral TID Adefeso, Oladapo, DO   20 g at 12/19/21 0854   pantoprazole (PROTONIX) injection 40 mg  40 mg Intravenous Q24H Adefeso, Oladapo, DO   40 mg at 12/19/21 0146   prochlorperazine (COMPAZINE) injection 10 mg  10 mg Intravenous Q6H PRN Adefeso, Oladapo, DO   10 mg at 12/19/21 4163   propranolol (INDERAL) tablet 10 mg  10 mg Oral Daily Adefeso, Oladapo, DO        Allergies as of 12/18/2021 - Review Complete 12/18/2021  Allergen Reaction Noted   Demerol Anaphylaxis 10/12/2010    Family History  Problem Relation Age of Onset   Diabetes Mother    Hypertension Mother    COPD Mother    Cirrhosis Father        ETOH   Diabetes Sister  Hypertension Sister    Crohn's disease Maternal Grandmother    Cancer Other    Colon cancer Neg Hx    Autoimmune disease Neg Hx     Social History   Socioeconomic History   Marital status: Married    Spouse name: Not on file   Number of children: Not on file   Years of education: Not on file   Highest education level: Not on file  Occupational History   Occupation: Unemployed    Comment: wants to go to nursing school in fall 2015  Tobacco Use   Smoking status: Every Day    Packs/day: 0.50    Years: 12.00    Total pack years: 6.00    Types: Cigarettes   Smokeless tobacco: Never  Vaping Use   Vaping Use: Never used  Substance and Sexual Activity   Alcohol use: Not Currently    Alcohol/week: 4.0 - 5.0 standard drinks of alcohol    Types: 4 - 5 Cans of beer per week    Comment: None since February 2023.   Drug use: Yes    Types: Marijuana    Comment: occasional   Sexual  activity: Yes    Birth control/protection: Surgical  Other Topics Concern   Not on file  Social History Narrative   Not on file   Social Determinants of Health   Financial Resource Strain: Not on file  Food Insecurity: No Food Insecurity (11/23/2021)   Hunger Vital Sign    Worried About Running Out of Food in the Last Year: Never true    Ran Out of Food in the Last Year: Never true  Transportation Needs: No Transportation Needs (11/23/2021)   PRAPARE - Hydrologist (Medical): No    Lack of Transportation (Non-Medical): No  Physical Activity: Not on file  Stress: Not on file  Social Connections: Not on file  Intimate Partner Violence: Not At Risk (11/23/2021)   Humiliation, Afraid, Rape, and Kick questionnaire    Fear of Current or Ex-Partner: No    Emotionally Abused: No    Physically Abused: No    Sexually Abused: No     Review of Systems   Gen: + lack of appetite, fatigue. Denies any fever, chills, change in weight or weight loss CV: Denies chest pain, heart palpitations, syncope, edema  Resp: Denies shortness of breath with rest, cough, wheezing, coughing up blood, and pleurisy. GI: see HPI GU : Denies urinary burning, blood in urine, urinary frequency, and urinary incontinence. MS: Denies joint pain, limitation of movement, swelling, cramps, and atrophy.  Derm: Denies rash, itching, dry skin, hives. Psych: Denies depression, anxiety, memory loss, hallucinations, and confusion. Heme: Denies bruising or bleeding Neuro:  + confusion. Denies any headaches, dizziness, paresthesias, shaking  Physical Exam   Vital Signs in last 24 hours: Temp:  [97.6 F (36.4 C)-98.2 F (36.8 C)] 98.1 F (36.7 C) (11/22 0858) Pulse Rate:  [72-94] 94 (11/22 0308) Resp:  [13-20] 18 (11/22 0858) BP: (125-167)/(67-91) 138/69 (11/22 0858) SpO2:  [98 %-100 %] 100 % (11/22 0858) Weight:  [66.1 kg-68.5 kg] 66.1 kg (11/22 0016) Last BM Date : 12/19/21  General:    Alert,  Well-developed, well-nourished, pleasant and cooperative in NAD Head:  Normocephalic and atraumatic. Eyes:  Sclera clear, no icterus.   Conjunctiva pink. Ears:  Normal auditory acuity. Lungs:  Clear throughout to auscultation.   No wheezes, crackles, or rhonchi. No acute distress. Heart:  Regular rate and rhythm; no  murmurs, clicks, rubs,  or gallops. Abdomen:  Soft, nontender and mildly-distended. Mild hepatomegaly. Normal bowel sounds, without guarding, and without rebound.   Rectal: deferred   Msk:  Symmetrical without gross deformities. Normal posture. Extremities:  Without clubbing or edema. Neurologic:  Alert and oriented x4, mild intermittent confusion. No asterixis Psych:  Alert and cooperative. Normal mood and affect.  Intake/Output from previous day: 11/21 0701 - 11/22 0700 In: 520.6 [IV Piggyback:520.6] Out: -  Intake/Output this shift: No intake/output data recorded.   Labs/Studies   Recent Labs Recent Labs    12/18/21 1830 12/19/21 0254  WBC 4.7 4.1  HGB 8.6* 7.0*  HCT 25.6* 20.8*  PLT 141* 107*   BMET Recent Labs    12/18/21 1830 12/19/21 0254  NA 129* 131*  K 2.8* 3.4*  CL 101 106  CO2 19* 19*  GLUCOSE 136* 114*  BUN 24* 20  CREATININE 1.53* 1.33*  CALCIUM 8.1* 7.9*   LFT Recent Labs    12/18/21 2118 12/19/21 0254  PROT 5.8* 5.5*  ALBUMIN 2.6* 2.7*  AST 100* 89*  ALT 41 36  ALKPHOS 96 85  BILITOT 2.9* 2.5*  BILIDIR 1.1*  --   IBILI 1.8*  --    PT/INR Recent Labs    12/18/21 2118  LABPROT 19.5*  INR 1.7*   Hepatitis Panel No results for input(s): "HEPBSAG", "HCVAB", "HEPAIGM", "HEPBIGM" in the last 72 hours. C-Diff No results for input(s): "CDIFFTOX" in the last 72 hours.  Radiology/Studies DG Chest Port 1 View  Result Date: 12/18/2021 CLINICAL DATA:  Shortness of breath EXAM: PORTABLE CHEST 1 VIEW COMPARISON:  Chest x-ray 11/22/2021 FINDINGS: The heart size and mediastinal contours are within normal limits. Both  lungs are clear. The visualized skeletal structures are unremarkable. IMPRESSION: No active disease. Electronically Signed   By: Ronney Asters M.D.   On: 12/18/2021 21:49     Assessment   Erica Banks is a 41 y.o. year old female with history of alcoholic cirrhosis, DVT, neuropathy, PE, GERD, IDA on scheduled iron infusions presented to the ED due to weakness, nausea, and vomiting.  She had reported nausea and vomiting for 2 days also with some shortness of breath and abdominal distention.  GI consulted given abdominal distention, elevated LFTs, electrolyte derangements, and AKI in the setting of cirrhosis.  Alcoholic cirrhosis with ascites: Has remained abstinent from alcohol since February 14.  Prior extensive workup unrevealing.  Her course has been complicated by ascites, coagulopathy, portal hypertensive gastropathy, grade 1/2 esophageal varices maintained on propranolol 10 mg daily.  Hemoglobin 8.6, plts 107, albumin 2.6, AST 100, ALT 41, alk phos 96, T. bili 2.9 (direct 1.1, indirect 1.8), sodium 129, BUN 24, creatinine 1.53 (0.82 two weeks ago). Denies melena, BRBPR, abdominal pain.  Has been experiencing a couple days of nausea and vomiting and generalized weakness.  States she had a paracentesis last week and had relief of her nausea initially.  AKI likely in the setting of dehydration.  She has reported some recent confusion at home, and some mild intermittent confusion this admission, however oriented x 4 during my exam.  Has had extreme fatigue and increased sleepiness.  Ammonia level 150 yesterday.  Has been taking lactulose 20 g 3 times daily at home with only 1 fall bowel movement per day.  Patient reports she is adhering to a 2 g sodium diet.  Currently without any bilateral lower extremity edema today however abdomen is mildly distended.  Currently without any nausea.  Plan  for paracentesis today. May consider starting Xifaxin if unable to improve hepatic encephalopathy with increase in  lactulose.  No asterixis.  No evidence of any leukocytosis.  Per chart review it appears that patient did have elevated cell count consistent with SBP last week (PMN >250), without any culture growth to date.  Will follow-up cell counts and cultures after paracentesis today.  Given some mild confusion, she would benefit from increasing her lactulose to 30 g 3 times daily and titrate for 3 semiformed stools daily.  If unable to meet stool goal, she may benefit from [place].  Given her AKI and ascites she would benefit from administration of albumin. Continue to monitor electrolytes and replace accordingly. May restart diuretics once AKI resolved.   Admission MELD Na: 25 (Would benefit from outpatient liver transplant referral)  Anemia: Was supposed to have outpatient iron infusion outpatient today.  Typically follows with hematology regarding this.  Received 1 unit PRBC as well as iron infusion during her last hospitalization.  Would benefit from iron infusion given Hgb 7 (down from 9.6 two weeks ago).  Recent EGD and colonoscopy performed in May revealing grade 1/2 esophageal varices without bleeding, portal hypertensive gastropathy, portal colopathy, internal and external hemorrhoids.  No reports of melena or BRBPR recently or during this admission.  Plan / Recommendations   Continue PPI  Agree with albumin given hypoalbunemia and ascites.  Increase Lactulose to 30g TID, titrate for 3 semi-formed stools daily. May consider starting Xifaxin  Continue propranolol 10 mg daily.  Hold diuretics in the setting of AKI F/u paracentesis and cell counts If need for paracentesis becomes more frequent despite diuretic regimen, may need to consider evaluation with IR regarding TIPS if MELD < 19.  Would benefit from iron infusion this admission. Would benefit from liver transplant referral outpatient.  Addendum: Patient underwent paracentesis yielding 1.7 L of ascites. Awaiting cell count results. Given her  prior positive cell counts last week we will treat with Rocephin and may need ongoing prophylaxis outpatient.     12/19/2021, 10:02 AM  Venetia Night, MSN, FNP-BC, AGACNP-BC [facility]

## 2021-12-19 NOTE — Evaluation (Signed)
Physical Therapy Evaluation Patient Details Name: Erica Banks MRN: [id number] DOB: 05-Nov-1980 Today's Date: 12/19/2021  History of Present Illness  Aury BESAN KETCHEM is a 41 y.o. female with medical history significant of alcoholic cirrhosis, DVT, neuropathy, pulmonary embolism, GERD, iron deficiency anemia who presents to the emergency department due to weakness, nausea and vomiting.  She endorsed 1 week of nausea on 2 days of nonbloody vomiting, but was unable to provide further history due to feeling sleepy.  Rest of history was obtained from ED PA and ED medical record.  Per report, patient complained of worsening nausea and vomiting after eating and she has not eaten anything since yesterday morning (12/17/2021).  She complained of shortness of breath at rest, she states that last paracentesis was 8 days ago.  Abdomen has become distended within the past 3 days with some right-sided discomfort.  Patient reported that she has been compliant with her medications and has since abstained from drinking alcohol.  She has 25 pack year history of smoking and occasionally uses marijuana (last use was 1.5 weeks ago).  She denies chest pain, fever, lightheadedness, dizziness or diarrhea.   Clinical Impression  Patient functioning near baseline for functional mobility and gait other than occasional drifting/staggering to the left during initial ambulation, but much improvement noted after ambulating for 2-3 minutes and demonstrates fair/good return for pushing IV pole when walking back to room.  Patient encouraged to ambulate with nursing staff as tolerated in hallways and ad lib in room.  Plan:  Patient discharged from physical therapy to care of nursing for ambulation daily as tolerated for length of stay.         Recommendations for follow up therapy are one component of a multi-disciplinary discharge planning process, led by the attending physician.  Recommendations may be updated based on patient  status, additional functional criteria and insurance authorization.  Follow Up Recommendations No PT follow up      Assistance Recommended at Discharge PRN  Patient can return home with the following  A little help with walking and/or transfers;Help with stairs or ramp for entrance    Equipment Recommendations None recommended by PT  Recommendations for Other Services       Functional Status Assessment Patient has not had a recent decline in their functional status     Precautions / Restrictions Precautions Precautions: Fall Restrictions Weight Bearing Restrictions: No      Mobility  Bed Mobility Overal bed mobility: Independent                  Transfers Overall transfer level: Needs assistance Equipment used: None Transfers: Sit to/from Stand, Bed to chair/wheelchair/BSC Sit to Stand: Independent   Step pivot transfers: Modified independent (Device/Increase time)       General transfer comment: slightly labored movement without loss of balance    Ambulation/Gait Ambulation/Gait assistance: Supervision Gait Distance (Feet): 200 Feet Assistive device: IV Pole, None Gait Pattern/deviations: WFL(Within Functional Limits), Drifts right/left, Staggering left Gait velocity: slightly decreased     General Gait Details: grossly WFL other than occasional drifting/staggering to the left without loss of balance, improvement noted after 2-3 minutes of walking and demonstrates fair/good return for pushing IV pole when walking back to room.  Stairs            Wheelchair Mobility    Modified Rankin (Stroke Patients Only)       Balance Overall balance assessment: Mild deficits observed, not formally tested  Pertinent Vitals/Pain Pain Assessment Pain Assessment: 0-10 Pain Score: 4  Pain Location: stomach Pain Descriptors / Indicators: Discomfort Pain Intervention(s): Limited activity within  patient's tolerance, Monitored during session, Repositioned    Home Living Family/patient expects to be discharged to:: Private residence Living Arrangements: Spouse/significant other;Children Available Help at Discharge: Family;Available PRN/intermittently Type of Home: House Home Access: Stairs to enter Entrance Stairs-Rails: Can reach both Entrance Stairs-Number of Steps: 4 to 5   Home Layout: One level Home Equipment: None      Prior Function Prior Level of Function : Independent/Modified Independent             Mobility Comments: Tourist information centre manager without AD ADLs Comments: Independent     Hand Dominance   Dominant Hand: Right    Extremity/Trunk Assessment   Upper Extremity Assessment Upper Extremity Assessment: Defer to OT evaluation    Lower Extremity Assessment Lower Extremity Assessment: Overall WFL for tasks assessed    Cervical / Trunk Assessment Cervical / Trunk Assessment: Normal  Communication   Communication: No difficulties  Cognition Arousal/Alertness: Awake/alert Behavior During Therapy: WFL for tasks assessed/performed Overall Cognitive Status: Within Functional Limits for tasks assessed                                          General Comments      Exercises     Assessment/Plan    PT Assessment Patient does not need any further PT services  PT Problem List         PT Treatment Interventions      PT Goals (Current goals can be found in the Care Plan section)  Acute Rehab PT Goals Patient Stated Goal: return home with family to assist PT Goal Formulation: With patient Time For Goal Achievement: 12/19/21 Potential to Achieve Goals: Good    Frequency       Co-evaluation PT/OT/SLP Co-Evaluation/Treatment: Yes Reason for Co-Treatment: To address functional/ADL transfers PT goals addressed during session: Mobility/safety with mobility;Balance OT goals addressed during session: ADL's and self-care        AM-PAC PT "6 Clicks" Mobility  Outcome Measure Help needed turning from your back to your side while in a flat bed without using bedrails?: None Help needed moving from lying on your back to sitting on the side of a flat bed without using bedrails?: None Help needed moving to and from a bed to a chair (including a wheelchair)?: None Help needed standing up from a chair using your arms (e.g., wheelchair or bedside chair)?: None Help needed to walk in hospital room?: A Little Help needed climbing 3-5 steps with a railing? : A Little 6 Click Score: 22    End of Session   Activity Tolerance: Patient tolerated treatment well Patient left: in chair;with call bell/phone within reach Nurse Communication: Mobility status PT Visit Diagnosis: Unsteadiness on feet (R26.81);Other abnormalities of gait and mobility (R26.89);Muscle weakness (generalized) (M62.81)    Time: 5170-0174 PT Time Calculation (min) (ACUTE ONLY): 14 min   Charges:   PT Evaluation $PT Eval Low Complexity: 1 Low PT Treatments $Gait Training: 8-22 mins        12:03 PM, 12/19/21 Ocie Bob, MPT Physical Therapist with [facility] [phone] office [phone] mobile phone

## 2021-12-19 NOTE — Progress Notes (Signed)
EKG done and given to nurse 

## 2021-12-19 NOTE — Plan of Care (Signed)

## 2021-12-20 DIAGNOSIS — N179 Acute kidney failure, unspecified: Secondary | ICD-10-CM | POA: Diagnosis not present

## 2021-12-20 DIAGNOSIS — K652 Spontaneous bacterial peritonitis: Secondary | ICD-10-CM | POA: Diagnosis not present

## 2021-12-20 DIAGNOSIS — K7031 Alcoholic cirrhosis of liver with ascites: Secondary | ICD-10-CM | POA: Diagnosis not present

## 2021-12-20 LAB — CBC
HCT: 21.7 % — ABNORMAL LOW (ref 36.0–46.0)
Hemoglobin: 7.3 g/dL — ABNORMAL LOW (ref 12.0–15.0)
MCH: 30 pg (ref 26.0–34.0)
MCHC: 33.6 g/dL (ref 30.0–36.0)
MCV: 89.3 fL (ref 80.0–100.0)
Platelets: 115 10*3/uL — ABNORMAL LOW (ref 150–400)
RBC: 2.43 MIL/uL — ABNORMAL LOW (ref 3.87–5.11)
RDW: 16.8 % — ABNORMAL HIGH (ref 11.5–15.5)
WBC: 4.2 10*3/uL (ref 4.0–10.5)
nRBC: 0 % (ref 0.0–0.2)

## 2021-12-20 LAB — COMPREHENSIVE METABOLIC PANEL
ALT: 36 U/L (ref 0–44)
AST: 90 U/L — ABNORMAL HIGH (ref 15–41)
Albumin: 3.1 g/dL — ABNORMAL LOW (ref 3.5–5.0)
Alkaline Phosphatase: 86 U/L (ref 38–126)
Anion gap: 5 (ref 5–15)
BUN: 13 mg/dL (ref 6–20)
CO2: 17 mmol/L — ABNORMAL LOW (ref 22–32)
Calcium: 7.9 mg/dL — ABNORMAL LOW (ref 8.9–10.3)
Chloride: 110 mmol/L (ref 98–111)
Creatinine, Ser: 1 mg/dL (ref 0.44–1.00)
GFR, Estimated: >60 mL/min (ref >60)
Glucose, Bld: 119 mg/dL — ABNORMAL HIGH (ref 70–99)
Potassium: 3.7 mmol/L (ref 3.5–5.1)
Sodium: 132 mmol/L — ABNORMAL LOW (ref 135–145)
Total Bilirubin: 2.4 mg/dL — ABNORMAL HIGH (ref 0.3–1.2)
Total Protein: 5.9 g/dL — ABNORMAL LOW (ref 6.5–8.1)

## 2021-12-20 LAB — CULTURE, BODY FLUID W GRAM STAIN -BOTTLE: Culture: NO GROWTH

## 2021-12-20 LAB — PROTIME-INR
INR: 1.9 — ABNORMAL HIGH (ref 0.8–1.2)
Prothrombin Time: 21.5 seconds — ABNORMAL HIGH (ref 11.4–15.2)

## 2021-12-20 LAB — AMMONIA: Ammonia: 149 umol/L — ABNORMAL HIGH (ref 9–35)

## 2021-12-20 MED ORDER — METHOCARBAMOL 1000 MG/10ML IJ SOLN
INTRAMUSCULAR | Status: AC
  Filled 2021-12-20: qty 10

## 2021-12-20 MED ORDER — ALBUMIN HUMAN 25 % IV SOLN
50.0000 g | Freq: Three times a day (TID) | INTRAVENOUS | Status: AC
  Administered 2021-12-20 (×2): 50 g via INTRAVENOUS
  Filled 2021-12-20 (×2): qty 200

## 2021-12-20 MED ORDER — DIPHENHYDRAMINE HCL 25 MG PO CAPS
50.0000 mg | ORAL_CAPSULE | Freq: Every evening | ORAL | Status: DC | PRN
  Administered 2021-12-20: 50 mg via ORAL
  Filled 2021-12-20: qty 2

## 2021-12-20 NOTE — Progress Notes (Addendum)
Lab called patients cultures come back with gram positive cocci anaerobic bottle. MD Memon made aware.

## 2021-12-20 NOTE — Progress Notes (Signed)
PROGRESS NOTE    Erica Banks  IRJ:[id number] DOB: 06/08/1980 DOA: 12/18/2021 PCP: Toma Deiters, MD    Brief Narrative:  41 year old female with a history of alcoholic cirrhosis, admitted to the hospital with abdominal distention, electrolyte derangements and acute kidney injury.  She underwent paracentesis.  GI consulted.  Electrolytes are being replaced.  Ammonia level significantly elevated, continued on lactulose.  Started on Rocephin for prophylactic treatment of SBP pending results of paracentesis.   Assessment & Plan:   Principal Problem:   Ascites due to alcoholic cirrhosis (HCC) Active Problems:   Hypokalemia   Intractable nausea and vomiting   Prolonged QT interval   Alcoholic cirrhosis of liver with ascites (HCC)   Hyponatremia   Hypoalbuminemia   Tobacco use disorder   Generalized weakness   AKI (acute kidney injury) (HCC)   Hyperammonemia (HCC)   Hypomagnesemia   Ascites due to alcoholic cirrhosis Spontaneous bacterial peritonitis -Status post paracentesis removal of 1.7 L of fluid on 11/22 -Body culture is growing gram-positive cocci -Started on empiric ceftriaxone -Appreciate GI following -Continued on propranolol -Of note, she did have paracentesis on 11/17 that did show elevated WBC count. -Can likely transition to p.o. Vantin on discharge to finish up her antibiotic course, will need long-term SBP prophylaxis with ciprofloxacin. -Diuretics currently on hold due to AKI  Hyponatremia -Likely related to cirrhosis, poor p.o. intake -Overall improving  Hypokalemia -Improving with replacement  Hypomagnesemia -Replaced  Hyperammonemia -Reports compliance with lactulose at home -Overall doses been increased since admission -Although ammonia level remains elevated, clinically she is not encephalopathic, no asterixis -Would continue with current management  Acute kidney injury -Creatinine 1.5 on admission, baseline 0.7-0.9 -She was due to  albumin infusions and overall creatinine has improved to 1.0  Generalized weakness -Seen by PT/OT with no further follow-up recommended  Anemia -Has been getting iron infusions as an outpatient -Hemoglobin d currently 7.3 -Recheck in a.m., if still low will give another unit of PRBC -No reported bleeding -Would defer iron infusion for now since she is being treated for infection   DVT prophylaxis: SCDs Start: 12/19/21 0041  Code Status: Full code Family Communication: No family present Disposition Plan: Status is: Inpatient Remains inpatient appropriate because: Continued workup of ascites and need for IV to biotics     Consultants:  Gastroenterology  Procedures:  Paracentesis 11/22 with removal of 1.7 L of fluid  Antimicrobials:  Ceftriaxone 11/22 >   Subjective: She is feeling better.  Ambulating in the halls.  Eager to discharge home  Objective: Vitals:   12/19/21 0858 12/19/21 1200 12/19/21 1242 12/19/21 1255  BP: 138/69 108/62 124/63 121/60  Pulse:  86 76 73  Resp: 18 20 20 16   Temp: 98.1 F (36.7 C)   97.8 F (36.6 C)  TempSrc:    Oral  SpO2: 100% 100% 100% 100%  Weight:      Height:        Intake/Output Summary (Last 24 hours) at 12/19/2021 1732 Last data filed at 12/19/2021 0459 Gross per 24 hour  Intake 520.56 ml  Output --  Net 520.56 ml   Filed Weights   12/18/21 1744 12/19/21 0016  Weight: 68.5 kg 66.1 kg    Examination:  General exam: Appears calm and comfortable  Respiratory system: Clear to auscultation. Respiratory effort normal. Cardiovascular system: S1 & S2 heard, RRR. No JVD, murmurs, rubs, gallops or clicks. No pedal edema. Gastrointestinal system: Abdomen is distended, soft and nontender. No organomegaly or masses felt.  Normal bowel sounds heard. Central nervous system: Alert and oriented. No focal neurological deficits. Extremities: Symmetric 5 x 5 power. Skin: No rashes, lesions or ulcers Psychiatry: Judgement and insight  appear normal. Mood & affect appropriate.     Data Reviewed: I have personally reviewed following labs and imaging studies  CBC: Recent Labs  Lab 12/18/21 1830 12/19/21 0254  WBC 4.7 4.1  HGB 8.6* 7.0*  HCT 25.6* 20.8*  MCV 88.0 88.1  PLT 141* 107*   Basic Metabolic Panel: Recent Labs  Lab 12/18/21 1830 12/18/21 2118 12/19/21 0254  NA 129*  --  131*  K 2.8*  --  3.4*  CL 101  --  106  CO2 19*  --  19*  GLUCOSE 136*  --  114*  BUN 24*  --  20  CREATININE 1.53*  --  1.33*  CALCIUM 8.1*  --  7.9*  MG  --  1.6* 2.3  PHOS  --   --  2.6   GFR: Estimated Creatinine Clearance: 52.6 mL/min (A) (by C-G formula based on SCr of 1.33 mg/dL (H)). Liver Function Tests: Recent Labs  Lab 12/18/21 2118 12/19/21 0254  AST 100* 89*  ALT 41 36  ALKPHOS 96 85  BILITOT 2.9* 2.5*  PROT 5.8* 5.5*  ALBUMIN 2.6* 2.7*   No results for input(s): "LIPASE", "AMYLASE" in the last 168 hours. Recent Labs  Lab 12/18/21 2119  AMMONIA 150*   Coagulation Profile: Recent Labs  Lab 12/18/21 2118  INR 1.7*   Cardiac Enzymes: No results for input(s): "CKTOTAL", "CKMB", "CKMBINDEX", "TROPONINI" in the last 168 hours. BNP (last 3 results) No results for input(s): "PROBNP" in the last 8760 hours. HbA1C: No results for input(s): "HGBA1C" in the last 72 hours. CBG: No results for input(s): "GLUCAP" in the last 168 hours. Lipid Profile: No results for input(s): "CHOL", "HDL", "LDLCALC", "TRIG", "CHOLHDL", "LDLDIRECT" in the last 72 hours. Thyroid Function Tests: No results for input(s): "TSH", "T4TOTAL", "FREET4", "T3FREE", "THYROIDAB" in the last 72 hours. Anemia Panel: No results for input(s): "VITAMINB12", "FOLATE", "FERRITIN", "TIBC", "IRON", "RETICCTPCT" in the last 72 hours. Sepsis Labs: No results for input(s): "PROCALCITON", "LATICACIDVEN" in the last 168 hours.  Recent Results (from the past 240 hour(s))  Gram stain     Status: None   Collection Time: 12/14/21 10:20 AM    Specimen: Ascitic  Result Value Ref Range Status   Specimen Description ASCITIC  Final   Special Requests NONE  Final   Gram Stain   Final    CYTOSPIN SMEAR NO ORGANISMS SEEN WBC PRESENT,BOTH PMN AND MONONUCLEAR Performed at [facility], [address] [place], [place] [address]    Report Status 12/15/2021 FINAL  Final  Culture, body fluid w Gram Stain-bottle     Status: None (Preliminary result)   Collection Time: 12/14/21 10:20 AM   Specimen: Ascitic  Result Value Ref Range Status   Specimen Description ASCITIC  Final   Special Requests PERITONEAL BOTTLES DRAWN AEROBIC AND ANAEROBIC  Final   Gram Stain   Final    NO ORGANISMS SEEN CYTOSPIN SMEAR WBC PRESENT,BOTH PMN AND MONONUCLEAR   Culture   Final    NO GROWTH 4 DAYS Performed at [facility], [address], [place], [place] [address]    Report Status PENDING  Incomplete  Resp Panel by RT-PCR (Flu A&B, Covid) Anterior Nasal Swab     Status: None   Collection Time: 12/18/21  9:05 PM   Specimen: Anterior Nasal Swab  Result  Value Ref Range Status   SARS Coronavirus 2 by RT PCR NEGATIVE NEGATIVE Final    Comment: (NOTE) SARS-CoV-2 target nucleic acids are NOT DETECTED.  The SARS-CoV-2 RNA is generally detectable in upper respiratory specimens during the acute phase of infection. The lowest concentration of SARS-CoV-2 viral copies this assay can detect is 138 copies/mL. A negative result does not preclude SARS-Cov-2 infection and should not be used as the sole basis for treatment or other patient management decisions. A negative result may occur with  improper specimen collection/handling, submission of specimen other than nasopharyngeal swab, presence of viral mutation(s) within the areas targeted by this assay, and inadequate number of viral copies(<138 copies/mL). A negative result must be combined with clinical observations, patient history, and epidemiological information. The expected result is Negative.  Fact Sheet  for Patients:  BloggerCourse.com  Fact Sheet for Healthcare Providers:  SeriousBroker.it  This test is no t yet approved or cleared by the [place] FDA and  has been authorized for detection and/or diagnosis of SARS-CoV-2 by FDA under an Emergency Use Authorization (EUA). This EUA will remain  in effect (meaning this test can be used) for the duration of the COVID-19 declaration under Section 564(b)(1) of the Act, 21 U.S.C.section 360bbb-3(b)(1), unless the authorization is terminated  or revoked sooner.       Influenza A by PCR NEGATIVE NEGATIVE Final   Influenza B by PCR NEGATIVE NEGATIVE Final    Comment: (NOTE) The Xpert Xpress SARS-CoV-2/FLU/RSV plus assay is intended as an aid in the diagnosis of influenza from Nasopharyngeal swab specimens and should not be used as a sole basis for treatment. Nasal washings and aspirates are unacceptable for Xpert Xpress SARS-CoV-2/FLU/RSV testing.  Fact Sheet for Patients: BloggerCourse.com  Fact Sheet for Healthcare Providers: SeriousBroker.it  This test is not yet approved or cleared by the [place] FDA and has been authorized for detection and/or diagnosis of SARS-CoV-2 by FDA under an Emergency Use Authorization (EUA). This EUA will remain in effect (meaning this test can be used) for the duration of the COVID-19 declaration under Section 564(b)(1) of the Act, 21 U.S.C. section 360bbb-3(b)(1), unless the authorization is terminated or revoked.  Performed at [facility], [address], [place], [place] [address]   Gram stain     Status: None   Collection Time: 12/19/21 12:00 PM   Specimen: Peritoneal Washings  Result Value Ref Range Status   Specimen Description PERITONEAL  Final   Special Requests ASCITIES  Final   Gram Stain   Final    NO ORGANISMS SEEN WBC PRESENT,BOTH PMN AND MONONUCLEAR CYTOSPIN SMEAR Performed  at [facility], [address], [place], [place] [address]    Report Status 12/19/2021 FINAL  Final         Radiology Studies: US Paracentesis  Result Date: 12/19/2021 INDICATION: Alcohol Cirrhosis; recurrent ascites EXAM: ULTRASOUND GUIDED RLQ PARACENTESIS MEDICATIONS: 10 cc 1% lidocaine COMPLICATIONS: None immediate. PROCEDURE: Informed written consent was obtained from the patient after a discussion of the risks, benefits and alternatives to treatment. A timeout was performed prior to the initiation of the procedure. Initial ultrasound scanning demonstrates a large amount of ascites within the right lower abdominal quadrant. The right lower abdomen was prepped and draped in the usual sterile fashion. 1% lidocaine was used for local anesthesia. Following this, a Yueh catheter was introduced. An ultrasound image was saved for documentation purposes. The paracentesis was performed. The catheter was removed and a dressing was applied. The patient tolerated  the procedure well without immediate post procedural complication. Patient received post-procedure intravenous albumin; see nursing notes for details. FINDINGS: A total of approximately 1.7 liters of yellow fluid was removed. Samples were sent to the laboratory as requested by the clinical team. IMPRESSION: Successful ultrasound-guided paracentesis yielding 1.7 liters of peritoneal fluid. PLAN: The patient has required >/=2 paracenteses in a 30 day period and a formal evaluation by the [facility] has been arranged. Read by Robet Leu [facility] Electronically Signed   By: Marliss Coots M.D.   On: 12/19/2021 14:09   DG Chest Port 1 View  Result Date: 12/18/2021 CLINICAL DATA:  Shortness of breath EXAM: PORTABLE CHEST 1 VIEW COMPARISON:  Chest x-ray 11/22/2021 FINDINGS: The heart size and mediastinal contours are within normal limits. Both lungs are clear. The visualized skeletal structures are  unremarkable. IMPRESSION: No active disease. Electronically Signed   By: Darliss Cheney M.D.   On: 12/18/2021 21:49        Scheduled Meds:  lactulose  30 g Oral TID   pantoprazole (PROTONIX) IV  40 mg Intravenous Q24H   propranolol  10 mg Oral Daily   Continuous Infusions:  albumin human     cefTRIAXone (ROCEPHIN)  IV 2 g (12/19/21 1640)     LOS: 1 day    Time spent: 35 mins    Erick Blinks, MD [facility]   If 7PM-7AM, please contact night-coverage [email or web address]  12/19/2021, 5:32 PM

## 2021-12-20 NOTE — Progress Notes (Signed)
Maylon Peppers, M.D. Gastroenterology & Hepatology   Interval History:  Patient reports feeling better today, denies having any nausea or abdominal pain.  Reported that her abdomen feels less distended and denies any fever or chills. Tolerated diet adequately without any vomiting. Patient had removal of 1.7 L of ascitic fluid yesterday, the sample was negative for SBP.  However, 5 days ago she had a paracentesis that was consistent with SBP.  She was started on ceftriaxone yesterday.  Inpatient Medications:  Current Facility-Administered Medications:    albumin human 25 % solution 50 g, 50 g, Intravenous, Q8H, Montez Morita, Teandre Hamre, MD   cefTRIAXone (ROCEPHIN) 2 g in sodium chloride 0.9 % 100 mL IVPB, 2 g, Intravenous, Q24H, Montez Morita, Laquinta Hazell, MD, Last Rate: 200 mL/hr at 12/19/21 1640, 2 g at 12/19/21 1640   gabapentin (NEURONTIN) capsule 300 mg, 300 mg, Oral, TID, Kathie Dike, MD, 300 mg at 12/20/21 0956   lactulose (CHRONULAC) 10 GM/15ML solution 30 g, 30 g, Oral, TID, Mickle Mallory, Courtney L, NP, 30 g at 12/20/21 0936   pantoprazole (PROTONIX) injection 40 mg, 40 mg, Intravenous, Q24H, Adefeso, Oladapo, DO, 40 mg at 12/20/21 0018   prochlorperazine (COMPAZINE) injection 10 mg, 10 mg, Intravenous, Q6H PRN, Adefeso, Oladapo, DO, 10 mg at 12/19/21 1739   propranolol (INDERAL) tablet 10 mg, 10 mg, Oral, Daily, Adefeso, Oladapo, DO   traMADol (ULTRAM) tablet 50 mg, 50 mg, Oral, Q6H PRN, Kathie Dike, MD, 50 mg at 12/19/21 1928   I/O    Intake/Output Summary (Last 24 hours) at 12/20/2021 1129 Last data filed at 12/20/2021 0700 Gross per 24 hour  Intake 600 ml  Output --  Net 600 ml     Physical Exam: Temp:  [97.7 F (36.5 C)-98.2 F (36.8 C)] 97.7 F (36.5 C) (11/23 0353) Pulse Rate:  [73-99] 91 (11/23 0353) Resp:  [16-20] 18 (11/23 0353) BP: (106-124)/(53-63) 106/54 (11/23 0353) SpO2:  [98 %-100 %] 100 % (11/23 0353)  Temp (24hrs), Avg:97.9 F (36.6 C), Min:97.7  F (36.5 C), Max:98.2 F (36.8 C) GENERAL: The patient is AO x3, in no acute distress. HEENT: Head is normocephalic and atraumatic. EOMI are intact. Mouth is well hydrated and without lesions. NECK: Supple. No masses LUNGS: Clear to auscultation. No presence of rhonchi/wheezing/rales. Adequate chest expansion HEART: RRR, normal s1 and s2. ABDOMEN: Soft, nontender, no guarding, no peritoneal signs. Mildly distended with possible non incarcerated umbilical hernia. BS +. No masses. EXTREMITIES: Without any cyanosis, clubbing, rash, lesions or edema. NEUROLOGIC: AOx3, no focal motor deficit. No asterixis SKIN: no jaundice, no rashes  Laboratory Data: CBC:     Component Value Date/Time   WBC 4.2 12/20/2021 0316   RBC 2.43 (L) 12/20/2021 0316   HGB 7.3 (L) 12/20/2021 0316   HCT 21.7 (L) 12/20/2021 0316   PLT 115 (L) 12/20/2021 0316   MCV 89.3 12/20/2021 0316   MCH 30.0 12/20/2021 0316   MCHC 33.6 12/20/2021 0316   RDW 16.8 (H) 12/20/2021 0316   LYMPHSABS 0.8 12/03/2021 0838   MONOABS 0.2 12/03/2021 0838   EOSABS 0.0 12/03/2021 0838   BASOSABS 0.0 12/03/2021 0838   COAG:  Lab Results  Component Value Date   INR 1.9 (H) 12/20/2021   INR 1.7 (H) 12/18/2021   INR 1.8 (H) 11/22/2021    BMP:     Latest Ref Rng & Units 12/20/2021    3:16 AM 12/19/2021    2:54 AM 12/18/2021    6:30 PM  BMP  Glucose 70 -  99 mg/dL 119  114  136   BUN 6 - 20 mg/dL _0 Creatinine 0.44 - 1.00 mg/dL 1.00  1.33  1.53   Sodium 135 - 145 mmol/L 132  131  129   Potassium 3.5 - 5.1 mmol/L 3.7  3.4  2.8   Chloride 98 - 111 mmol/L 110  106  101   CO2 22 - 32 mmol/L _1 Calcium 8.9 - 10.3 mg/dL 7.9  7.9  8.1     HEPATIC:     Latest Ref Rng & Units 12/20/2021    3:16 AM 12/19/2021    2:54 AM 12/18/2021    9:18 PM  Hepatic Function  Total Protein 6.5 - 8.1 g/dL 5.9  5.5  5.8   Albumin 3.5 - 5.0 g/dL 3.1  2.7  2.6   AST 15 - 41 U/L 90  89  100   ALT 0 - 44 U/L 36  36  41   Alk  Phosphatase 38 - 126 U/L 86  85  96   Total Bilirubin 0.3 - 1.2 mg/dL 2.4  2.5  2.9   Bilirubin, Direct 0.0 - 0.2 mg/dL   1.1     CARDIAC:  Lab Results  Component Value Date   CKTOTAL 18 (L) 08/24/2020      Imaging: I personally reviewed and interpreted the available labs, imaging and endoscopic files.   Assessment/Plan: Erica Banks is a 41 y.o. year old female with history of alcoholic cirrhosis complicated by recurrent ascites, hepatic encephalopathy and nonbleeding esophageal varices, DVT, neuropathy, PE, GERD, IDA on scheduled iron infusions presented to the ED due to weakness, nausea, and vomiting.  Patient was found to have worsening AKI and severe electrolyte derangements in the setting of continued vomiting.  The patient underwent a paracentesis during the current hospitalization which was negative for SBP and only 1.7 L of fluid were removed.  However, she had recently an outpatient paracentesis that showed changes consistent with SBP -unfortunately, these results were not forwarded to the primary gastroenterologist and she was not started on any antibiotic treatment.  She was started on ceftriaxone yesterday has presented improvement of her symptoms since then.  She will need to be treated as a regular episode of SBP, for which she will need to finish a 7-day course with a third-generation cephalosporin (can switch to Vantin to complete a 7-day course) and will need to continue with ciprofloxacin prophylaxis 500 mg daily indefinitely.  In terms of her decompensated liver cirrhosis, she has not presented any other decompensating events.  Her recurrent ascites could be related to the presence of SBP.  She was referred by interventional radiology to the portal hypertension clinic, I advised her to follow-up with them in the clinic  - TIPS could be considered as an option if her MELD improves. MELD 3.0 today remains elevated at 21 - will need to follow with transplant hepatology at  [facility].  She was found to have grade 2 esophageal varices in the past which have been managed with low-dose nadolol.  I do not suspect she has been bleeding based on her clinical symptoms and the fact that she has remained hemodynamically stable.  Could consider uptitrating the nonselective beta-blocker as outpatient.  The patient had presence of hepatic encephalopathy which has been treated with lactulose.  She is at goal in terms of bowel movements and is not present any clinical encephalopathy or asterixis.  She  should continue with lactulose TID for now.  Finally, her AKI has improved with the use of albumin, which she will be continued for now and she should receive the third dose of 75 g tomorrow as part of the protocol for SBP.  - Continue ceftriaxone 1 g qday, needs to complete 7 day course (can be transitioned to PO Vantin) - Will need indefinite PPX with ciprofloxacin 500 mg qday - start after finishing Vantin - Daily H/H - Albumin 50 g TID IV - Daily MELD labs - Continue nadolol 10 mg qday - Continue Lactulose TID, titrate to achieve 2-3 Bms per day - Follow up as outpatient with  transplant hepatology at [facility].  Maylon Peppers, MD Gastroenterology and Hepatology [facility] Gastroenterology

## 2021-12-21 DIAGNOSIS — K649 Unspecified hemorrhoids: Secondary | ICD-10-CM | POA: Diagnosis not present

## 2021-12-21 DIAGNOSIS — D500 Iron deficiency anemia secondary to blood loss (chronic): Secondary | ICD-10-CM | POA: Diagnosis not present

## 2021-12-21 DIAGNOSIS — K7031 Alcoholic cirrhosis of liver with ascites: Secondary | ICD-10-CM | POA: Diagnosis not present

## 2021-12-21 DIAGNOSIS — K652 Spontaneous bacterial peritonitis: Secondary | ICD-10-CM | POA: Diagnosis not present

## 2021-12-21 LAB — CBC WITH DIFFERENTIAL/PLATELET
Abs Immature Granulocytes: 0.02 10*3/uL (ref 0.00–0.07)
Basophils Absolute: 0 10*3/uL (ref 0.0–0.1)
Basophils Relative: 1 %
Eosinophils Absolute: 0.2 10*3/uL (ref 0.0–0.5)
Eosinophils Relative: 4 %
HCT: 23.6 % — ABNORMAL LOW (ref 36.0–46.0)
Hemoglobin: 7.5 g/dL — ABNORMAL LOW (ref 12.0–15.0)
Immature Granulocytes: 1 %
Lymphocytes Relative: 31 %
Lymphs Abs: 1.2 10*3/uL (ref 0.7–4.0)
MCH: 28.7 pg (ref 26.0–34.0)
MCHC: 31.8 g/dL (ref 30.0–36.0)
MCV: 90.4 fL (ref 80.0–100.0)
Monocytes Absolute: 0.5 10*3/uL (ref 0.1–1.0)
Monocytes Relative: 13 %
Neutro Abs: 2 10*3/uL (ref 1.7–7.7)
Neutrophils Relative %: 50 %
Platelets: 101 10*3/uL — ABNORMAL LOW (ref 150–400)
RBC: 2.61 MIL/uL — ABNORMAL LOW (ref 3.87–5.11)
RDW: 16.6 % — ABNORMAL HIGH (ref 11.5–15.5)
WBC: 3.9 10*3/uL — ABNORMAL LOW (ref 4.0–10.5)
nRBC: 0 % (ref 0.0–0.2)

## 2021-12-21 LAB — CBC
HCT: 18.4 % — ABNORMAL LOW (ref 36.0–46.0)
Hemoglobin: 5.8 g/dL — CL (ref 12.0–15.0)
MCH: 28.7 pg (ref 26.0–34.0)
MCHC: 31.5 g/dL (ref 30.0–36.0)
MCV: 91.1 fL (ref 80.0–100.0)
Platelets: 96 10*3/uL — ABNORMAL LOW (ref 150–400)
RBC: 2.02 MIL/uL — ABNORMAL LOW (ref 3.87–5.11)
RDW: 16.7 % — ABNORMAL HIGH (ref 11.5–15.5)
WBC: 3 10*3/uL — ABNORMAL LOW (ref 4.0–10.5)
nRBC: 0 % (ref 0.0–0.2)

## 2021-12-21 LAB — MAGNESIUM: Magnesium: 1.7 mg/dL (ref 1.7–2.4)

## 2021-12-21 LAB — PROTIME-INR
INR: 2.3 — ABNORMAL HIGH (ref 0.8–1.2)
Prothrombin Time: 25.1 seconds — ABNORMAL HIGH (ref 11.4–15.2)

## 2021-12-21 LAB — COMPREHENSIVE METABOLIC PANEL
ALT: 32 U/L (ref 0–44)
AST: 80 U/L — ABNORMAL HIGH (ref 15–41)
Albumin: 3.1 g/dL — ABNORMAL LOW (ref 3.5–5.0)
Alkaline Phosphatase: 69 U/L (ref 38–126)
Anion gap: 4 — ABNORMAL LOW (ref 5–15)
BUN: 10 mg/dL (ref 6–20)
CO2: 17 mmol/L — ABNORMAL LOW (ref 22–32)
Calcium: 7.9 mg/dL — ABNORMAL LOW (ref 8.9–10.3)
Chloride: 110 mmol/L (ref 98–111)
Creatinine, Ser: 0.78 mg/dL (ref 0.44–1.00)
GFR, Estimated: >60 mL/min (ref >60)
Glucose, Bld: 159 mg/dL — ABNORMAL HIGH (ref 70–99)
Potassium: 3.1 mmol/L — ABNORMAL LOW (ref 3.5–5.1)
Sodium: 131 mmol/L — ABNORMAL LOW (ref 135–145)
Total Bilirubin: 2.2 mg/dL — ABNORMAL HIGH (ref 0.3–1.2)
Total Protein: 5.4 g/dL — ABNORMAL LOW (ref 6.5–8.1)

## 2021-12-21 LAB — PREPARE RBC (CROSSMATCH)

## 2021-12-21 MED ORDER — GABAPENTIN 300 MG PO CAPS: 300.0000 mg | ORAL_CAPSULE | Freq: Three times a day (TID) | ORAL | Status: DC | PRN

## 2021-12-21 MED ORDER — FUROSEMIDE 40 MG PO TABS
40.0000 mg | ORAL_TABLET | Freq: Every day | ORAL | Status: DC
  Administered 2021-12-21: 40 mg via ORAL
  Filled 2021-12-21 (×2): qty 1

## 2021-12-21 MED ORDER — ALBUMIN HUMAN 25 % IV SOLN
50.0000 g | Freq: Three times a day (TID) | INTRAVENOUS | Status: AC
  Administered 2021-12-21 – 2021-12-22 (×3): 50 g via INTRAVENOUS
  Filled 2021-12-21 (×3): qty 200

## 2021-12-21 MED ORDER — SODIUM CHLORIDE 0.9% IV SOLUTION: Freq: Once | INTRAVENOUS | Status: AC

## 2021-12-21 MED ORDER — HYDROXYZINE HCL 25 MG PO TABS
25.0000 mg | ORAL_TABLET | Freq: Three times a day (TID) | ORAL | Status: DC | PRN
  Administered 2021-12-21: 25 mg via ORAL
  Filled 2021-12-21 (×2): qty 1

## 2021-12-21 MED ORDER — POTASSIUM CHLORIDE CRYS ER 20 MEQ PO TBCR
40.0000 meq | EXTENDED_RELEASE_TABLET | ORAL | Status: AC
  Administered 2021-12-21 (×2): 40 meq via ORAL
  Filled 2021-12-21 (×2): qty 2

## 2021-12-21 MED ORDER — SPIRONOLACTONE 25 MG PO TABS
100.0000 mg | ORAL_TABLET | Freq: Every day | ORAL | Status: DC
  Administered 2021-12-21: 100 mg via ORAL
  Filled 2021-12-21 (×2): qty 4

## 2021-12-21 NOTE — Progress Notes (Signed)
Patient's hemoglobin is 5.8. Results called to MD.    12/21/21 0320  Provider Notification  Provider Name/Title Dr. Carren Rang  Date Provider Notified 12/21/21  Time Provider Notified [phone]  Method of Notification Page (via secure chat)  Notification Reason Critical Result  Test performed and critical result HBG 5.8  Date Critical Result Received 12/21/21  Time Critical Result Received 0320

## 2021-12-21 NOTE — Progress Notes (Signed)
PROGRESS NOTE    Erica Banks  ERX:[id number] DOB: March 20, 1980 DOA: 12/18/2021 PCP: Toma Deiters, MD    Brief Narrative:  41 year old female with a history of alcoholic cirrhosis, admitted to the hospital with abdominal distention, electrolyte derangements and acute kidney injury.  She underwent paracentesis.  GI consulted.  Electrolytes are being replaced.  Ammonia level significantly elevated, continued on lactulose.  Started on Rocephin for prophylactic treatment of SBP pending results of paracentesis.   Assessment & Plan:   Principal Problem:   Ascites due to alcoholic cirrhosis (HCC) Active Problems:   Hypokalemia   Intractable nausea and vomiting   Prolonged QT interval   Alcoholic cirrhosis of liver with ascites (HCC)   Hyponatremia   Hypoalbuminemia   Tobacco use disorder   Generalized weakness   AKI (acute kidney injury) (HCC)   Hyperammonemia (HCC)   Hypomagnesemia   Ascites due to alcoholic cirrhosis Spontaneous bacterial peritonitis -Status post paracentesis removal of 1.7 L of fluid on 11/22 -Body culture is growing gram-positive cocci -Started on empiric ceftriaxone -Appreciate GI following -Continued on propranolol -Of note, she did have paracentesis on 11/17 that did show elevated WBC count. -Can likely transition to p.o. Vantin on discharge to finish up her antibiotic course, will need long-term SBP prophylaxis with ciprofloxacin. -Resume Lasix and Aldactone today  Hyponatremia -Likely related to cirrhosis, poor p.o. intake -Overall improving  Hypokalemia -Improving with replacement  Hypomagnesemia -Replaced  Hyperammonemia -Reports compliance with lactulose at home -Overall doses been increased since admission -Although ammonia level remains elevated, clinically she is not encephalopathic, no asterixis -Would continue with current management  Acute kidney injury -Creatinine 1.5 on admission, baseline 0.7-0.9 -She was due to albumin  infusions and overall creatinine has improved to 0.78  Generalized weakness -Seen by PT/OT with no further follow-up recommended  Anemia -Has been getting iron infusions as an outpatient -Hemoglobin trended down to 5.8 -She was transfused 1 unit PRBC 11/24 -Follow-up hemoglobin improved to 7.5 -Continue to follow -Would defer iron infusion for now since she is being treated for infection -Reported bleeding in stools from possible hemorrhoids versus other GI bleeding -Keeping n.p.o. after midnight in case EGD is needed tomorrow   DVT prophylaxis: SCDs Start: 12/19/21 0041  Code Status: Full code Family Communication: No family present Disposition Plan: Status is: Inpatient Remains inpatient appropriate because: Continued workup of ascites and need for IV to biotics     Consultants:  Gastroenterology  Procedures:  Paracentesis 11/22 with removal of 1.7 L of fluid  Antimicrobials:  Ceftriaxone 11/22 >   Subjective: Admits to noticing some blood in her stools, which she attributes to hemorrhoidal bleeding  Objective: Vitals:   12/21/21 0555 12/21/21 0911 12/21/21 0926 12/21/21 1215  BP: (!) 97/58 123/74 109/70 129/74  Pulse: 100 95 90 96  Resp: 20 20 20 20   Temp: 98 F (36.7 C) 98.2 F (36.8 C) 98.9 F (37.2 C) 98.6 F (37 C)  TempSrc: Oral Oral Oral Oral  SpO2: 100%  100% 99%  Weight:      Height:        Intake/Output Summary (Last 24 hours) at 12/21/2021 2006 Last data filed at 12/21/2021 1700 Gross per 24 hour  Intake 1513.47 ml  Output --  Net 1513.47 ml   Filed Weights   12/18/21 1744 12/19/21 0016  Weight: 68.5 kg 66.1 kg    Examination:  General exam: Appears calm and comfortable  Respiratory system: Clear to auscultation. Respiratory effort normal. Cardiovascular system: S1 &  S2 heard, RRR. No JVD, murmurs, rubs, gallops or clicks. No pedal edema. Gastrointestinal system: Abdomen is distended, soft and nontender. No organomegaly or masses  felt. Normal bowel sounds heard. Central nervous system: Alert and oriented. No focal neurological deficits. Extremities: Symmetric 5 x 5 power. Skin: No rashes, lesions or ulcers Psychiatry: Judgement and insight appear normal. Mood & affect appropriate.     Data Reviewed: I have personally reviewed following labs and imaging studies  CBC: Recent Labs  Lab 12/18/21 1830 12/19/21 0254 12/20/21 0316 12/21/21 0257 12/21/21 1419  WBC 4.7 4.1 4.2 3.0* 3.9*  NEUTROABS  --   --   --   --  2.0  HGB 8.6* 7.0* 7.3* 5.8* 7.5*  HCT 25.6* 20.8* 21.7* 18.4* 23.6*  MCV 88.0 88.1 89.3 91.1 90.4  PLT 141* 107* 115* 96* 101*   Basic Metabolic Panel: Recent Labs  Lab 12/18/21 1830 12/18/21 2118 12/19/21 0254 12/20/21 0316 12/21/21 0257  NA 129*  --  131* 132* 131*  K 2.8*  --  3.4* 3.7 3.1*  CL 101  --  106 110 110  CO2 19*  --  19* 17* 17*  GLUCOSE 136*  --  114* 119* 159*  BUN 24*  --  20 13 10   CREATININE 1.53*  --  1.33* 1.00 0.78  CALCIUM 8.1*  --  7.9* 7.9* 7.9*  MG  --  1.6* 2.3  --  1.7  PHOS  --   --  2.6  --   --    GFR: Estimated Creatinine Clearance: 87.5 mL/min (by C-G formula based on SCr of 0.78 mg/dL). Liver Function Tests: Recent Labs  Lab 12/18/21 2118 12/19/21 0254 12/20/21 0316 12/21/21 0257  AST 100* 89* 90* 80*  ALT 41 36 36 32  ALKPHOS 96 85 86 69  BILITOT 2.9* 2.5* 2.4* 2.2*  PROT 5.8* 5.5* 5.9* 5.4*  ALBUMIN 2.6* 2.7* 3.1* 3.1*   No results for input(s): "LIPASE", "AMYLASE" in the last 168 hours. Recent Labs  Lab 12/18/21 2119 12/20/21 0316  AMMONIA 150* 149*   Coagulation Profile: Recent Labs  Lab 12/18/21 2118 12/20/21 0316 12/21/21 0257  INR 1.7* 1.9* 2.3*   Cardiac Enzymes: No results for input(s): "CKTOTAL", "CKMB", "CKMBINDEX", "TROPONINI" in the last 168 hours. BNP (last 3 results) No results for input(s): "PROBNP" in the last 8760 hours. HbA1C: No results for input(s): "HGBA1C" in the last 72 hours. CBG: No results for  input(s): "GLUCAP" in the last 168 hours. Lipid Profile: No results for input(s): "CHOL", "HDL", "LDLCALC", "TRIG", "CHOLHDL", "LDLDIRECT" in the last 72 hours. Thyroid Function Tests: No results for input(s): "TSH", "T4TOTAL", "FREET4", "T3FREE", "THYROIDAB" in the last 72 hours. Anemia Panel: No results for input(s): "VITAMINB12", "FOLATE", "FERRITIN", "TIBC", "IRON", "RETICCTPCT" in the last 72 hours. Sepsis Labs: No results for input(s): "PROCALCITON", "LATICACIDVEN" in the last 168 hours.  Recent Results (from the past 240 hour(s))  Gram stain     Status: None   Collection Time: 12/14/21 10:20 AM   Specimen: Ascitic  Result Value Ref Range Status   Specimen Description ASCITIC  Final   Special Requests NONE  Final   Gram Stain   Final    CYTOSPIN SMEAR NO ORGANISMS SEEN WBC PRESENT,BOTH PMN AND MONONUCLEAR Performed at [facility], [address], [place], [place]    Report Status 12/15/2021 FINAL  Final  Culture, body fluid w Gram Stain-bottle     Status: None   Collection Time: 12/14/21 10:20 AM  Specimen: Ascitic  Result Value Ref Range Status   Specimen Description ASCITIC  Final   Special Requests PERITONEAL BOTTLES DRAWN AEROBIC AND ANAEROBIC  Final   Gram Stain   Final    NO ORGANISMS SEEN CYTOSPIN SMEAR WBC PRESENT,BOTH PMN AND MONONUCLEAR   Culture   Final    NO GROWTH 6 DAYS Performed at [facility], [address] [place], [place] [address]    Report Status 12/20/2021 FINAL  Final  Resp Panel by RT-PCR (Flu A&B, Covid) Anterior Nasal Swab     Status: None   Collection Time: 12/18/21  9:05 PM   Specimen: Anterior Nasal Swab  Result Value Ref Range Status   SARS Coronavirus 2 by RT PCR NEGATIVE NEGATIVE Final    Comment: (NOTE) SARS-CoV-2 target nucleic acids are NOT DETECTED.  The SARS-CoV-2 RNA is generally detectable in upper respiratory specimens during the acute phase of infection. The lowest concentration of SARS-CoV-2 viral copies this  assay can detect is 138 copies/mL. A negative result does not preclude SARS-Cov-2 infection and should not be used as the sole basis for treatment or other patient management decisions. A negative result may occur with  improper specimen collection/handling, submission of specimen other than nasopharyngeal swab, presence of viral mutation(s) within the areas targeted by this assay, and inadequate number of viral copies(<138 copies/mL). A negative result must be combined with clinical observations, patient history, and epidemiological information. The expected result is Negative.  Fact Sheet for Patients:  BloggerCourse.com  Fact Sheet for Healthcare Providers:  SeriousBroker.it  This test is no t yet approved or cleared by the [place] FDA and  has been authorized for detection and/or diagnosis of SARS-CoV-2 by FDA under an Emergency Use Authorization (EUA). This EUA will remain  in effect (meaning this test can be used) for the duration of the COVID-19 declaration under Section 564(b)(1) of the Act, 21 U.S.C.section 360bbb-3(b)(1), unless the authorization is terminated  or revoked sooner.       Influenza A by PCR NEGATIVE NEGATIVE Final   Influenza B by PCR NEGATIVE NEGATIVE Final    Comment: (NOTE) The Xpert Xpress SARS-CoV-2/FLU/RSV plus assay is intended as an aid in the diagnosis of influenza from Nasopharyngeal swab specimens and should not be used as a sole basis for treatment. Nasal washings and aspirates are unacceptable for Xpert Xpress SARS-CoV-2/FLU/RSV testing.  Fact Sheet for Patients: BloggerCourse.com  Fact Sheet for Healthcare Providers: SeriousBroker.it  This test is not yet approved or cleared by the [place] FDA and has been authorized for detection and/or diagnosis of SARS-CoV-2 by FDA under an Emergency Use Authorization (EUA). This EUA will  remain in effect (meaning this test can be used) for the duration of the COVID-19 declaration under Section 564(b)(1) of the Act, 21 U.S.C. section 360bbb-3(b)(1), unless the authorization is terminated or revoked.  Performed at [facility], [address], [place], [place] [address]   Culture, body fluid w Gram Stain-bottle     Status: Abnormal (Preliminary result)   Collection Time: 12/19/21 12:00 PM   Specimen: Peritoneal Washings  Result Value Ref Range Status   Specimen Description   Final    PERITONEAL Performed at [facility], [address] [place], [place] [address]    Special Requests   Final    10 CC Performed at [facility], [address], [place], [place] [address]    Gram Stain   Final    GRAM POSITIVE COCCI ANAEROBIC BOTTLE ONLY Gram Stain Report  Called to,Read Back By and Verified With: Waylan Boga, LPN @1336  12/20/21  BY [facility] Performed at [facility], [address], [place], [place]    Culture (A)  Final    STAPHYLOCOCCUS CAPITIS SUSCEPTIBILITIES TO FOLLOW Performed at [facility], [address], [place], [place]    Report Status PENDING  Incomplete  Gram stain     Status: None   Collection Time: 12/19/21 12:00 PM   Specimen: Peritoneal Washings  Result Value Ref Range Status   Specimen Description PERITONEAL  Final   Special Requests ASCITIES  Final   Gram Stain   Final    NO ORGANISMS SEEN WBC PRESENT,BOTH PMN AND MONONUCLEAR CYTOSPIN SMEAR Performed at [facility], [address], [place], [place]    Report Status 12/19/2021 FINAL  Final         Radiology Studies: No results found.      Scheduled Meds:  furosemide  40 mg Oral Daily   lactulose  30 g Oral TID   pantoprazole (PROTONIX) IV  40 mg Intravenous Q24H   propranolol  10 mg Oral Daily   spironolactone  100 mg Oral Daily   Continuous Infusions:  albumin human 50 g (12/21/21 1220)   cefTRIAXone (ROCEPHIN)  IV 2 g (12/21/21 1627)      LOS: 3 days    Time spent: 35 mins    12/23/21, MD [facility]   If 7PM-7AM, please contact night-coverage [email or web address]  12/21/2021, 8:06 PM

## 2021-12-21 NOTE — Progress Notes (Signed)
Maylon Peppers, M.D. Gastroenterology & Hepatology   Interval History:  Patient feels better today.  States she has had intermittent rectal bleeding for the last few days with last episode yesterday night.  Denies any nausea, vomiting, fever or chills.  Has tolerated diet adequately. Notably, her hemoglobin dropped down to 5.8 today and he received 1 unit of PRBC.  Also no to have drop in her other cell lines.  Kidney function is better with creatinine 0.78.  Inpatient Medications:  Current Facility-Administered Medications:    albumin human 25 % solution 50 g, 50 g, Intravenous, Q8H, Montez Morita, [place], MD, Last Rate: 60 mL/hr at 12/20/21 2147, 50 g at 12/20/21 2147   albumin human 25 % solution 50 g, 50 g, Intravenous, Q8H, Montez Morita, Hanley Woerner, MD   cefTRIAXone (ROCEPHIN) 2 g in sodium chloride 0.9 % 100 mL IVPB, 2 g, Intravenous, Q24H, Montez Morita, Morrie Daywalt, MD, Last Rate: 200 mL/hr at 12/20/21 1632, Infusion Verify at 12/20/21 1632   diphenhydrAMINE (BENADRYL) capsule 50 mg, 50 mg, Oral, QHS PRN, Kathie Dike, MD, 50 mg at 12/20/21 2137   gabapentin (NEURONTIN) capsule 300 mg, 300 mg, Oral, TID, Kathie Dike, MD, 300 mg at 12/21/21 0941   lactulose (CHRONULAC) 10 GM/15ML solution 30 g, 30 g, Oral, TID, Mahon, Courtney L, NP, 30 g at 12/21/21 0945   pantoprazole (PROTONIX) injection 40 mg, 40 mg, Intravenous, Q24H, Adefeso, [place], DO, 40 mg at 12/21/21 0142   potassium chloride SA (KLOR-CON M) CR tablet 40 mEq, 40 mEq, Oral, Q4H, Memon, Jolaine Artist, MD, 40 mEq at 12/21/21 0953   prochlorperazine (COMPAZINE) injection 10 mg, 10 mg, Intravenous, Q6H PRN, Adefeso, Oladapo, DO, 10 mg at 12/19/21 1739   propranolol (INDERAL) tablet 10 mg, 10 mg, Oral, Daily, Adefeso, Oladapo, DO   traMADol (ULTRAM) tablet 50 mg, 50 mg, Oral, Q6H PRN, Kathie Dike, MD, 50 mg at 12/19/21 1928   I/O    Intake/Output Summary (Last 24 hours) at 12/21/2021 1119 Last data filed at  12/21/2021 0855 Gross per 24 hour  Intake 811.62 ml  Output --  Net 811.62 ml     Physical Exam: Temp:  [97.9 F (36.6 C)-98.9 F (37.2 C)] 98.9 F (37.2 C) (11/24 0926) Pulse Rate:  [90-100] 90 (11/24 0926) Resp:  [16-20] 20 (11/24 0926) BP: (97-123)/(57-78) 109/70 (11/24 0926) SpO2:  [100 %] 100 % (11/24 0926)  Temp (24hrs), Avg:98.2 F (36.8 C), Min:97.9 F (36.6 C), Max:98.9 F (37.2 C) GENERAL: The patient is AO x3, in no acute distress. HEENT: Head is normocephalic and atraumatic. EOMI are intact. Mouth is well hydrated and without lesions. NECK: Supple. No masses LUNGS: Clear to auscultation. No presence of rhonchi/wheezing/rales. Adequate chest expansion HEART: RRR, normal s1 and s2. ABDOMEN: Soft, nontender, no guarding, no peritoneal signs. Mildly distended with possible non incarcerated ventral hernia. BS +. No masses. EXTREMITIES: Without any cyanosis, clubbing, rash, lesions or edema. NEUROLOGIC: AOx3, no focal motor deficit. No asterixis SKIN: no jaundice, no rashes  Laboratory Data: CBC:     Component Value Date/Time   WBC 3.0 (L) 12/21/2021 0257   RBC 2.02 (L) 12/21/2021 0257   HGB 5.8 (LL) 12/21/2021 0257   HCT 18.4 (L) 12/21/2021 0257   PLT 96 (L) 12/21/2021 0257   MCV 91.1 12/21/2021 0257   MCH 28.7 12/21/2021 0257   MCHC 31.5 12/21/2021 0257   RDW 16.7 (H) 12/21/2021 0257   LYMPHSABS 0.8 12/03/2021 0838   MONOABS 0.2 12/03/2021 0838   EOSABS 0.0 12/03/2021  7741   BASOSABS 0.0 12/03/2021 0838   COAG:  Lab Results  Component Value Date   INR 2.3 (H) 12/21/2021   INR 1.9 (H) 12/20/2021   INR 1.7 (H) 12/18/2021    BMP:     Latest Ref Rng & Units 12/21/2021    2:57 AM 12/20/2021    3:16 AM 12/19/2021    2:54 AM  BMP  Glucose 70 - 99 mg/dL 159  119  114   BUN 6 - 20 mg/dL _0 Creatinine 0.44 - 1.00 mg/dL 0.78  1.00  1.33   Sodium 135 - 145 mmol/L 131  132  131   Potassium 3.5 - 5.1 mmol/L 3.1  3.7  3.4   Chloride 98 - 111  mmol/L 110  110  106   CO2 22 - 32 mmol/L _1 Calcium 8.9 - 10.3 mg/dL 7.9  7.9  7.9     HEPATIC:     Latest Ref Rng & Units 12/21/2021    2:57 AM 12/20/2021    3:16 AM 12/19/2021    2:54 AM  Hepatic Function  Total Protein 6.5 - 8.1 g/dL 5.4  5.9  5.5   Albumin 3.5 - 5.0 g/dL 3.1  3.1  2.7   AST 15 - 41 U/L 80  90  89   ALT 0 - 44 U/L 32  36  36   Alk Phosphatase 38 - 126 U/L 69  86  85   Total Bilirubin 0.3 - 1.2 mg/dL 2.2  2.4  2.5     CARDIAC:  Lab Results  Component Value Date   CKTOTAL 18 (L) 08/24/2020      Imaging: I personally reviewed and interpreted the available labs, imaging and endoscopic files.   Assessment/Plan: Erica Banks is a 41 y.o. year old female with history of alcoholic cirrhosis complicated by recurrent ascites, hepatic encephalopathy and nonbleeding esophageal varices, DVT, neuropathy, PE, GERD, IDA on scheduled iron infusions presented to the ED due to weakness, nausea, and vomiting.  Patient was found to have worsening AKI and severe electrolyte derangements in the setting of continued vomiting.   The patient underwent a paracentesis during the current hospitalization which was negative for SBP and only 1.7 L of fluid were removed.  However, she had recently an outpatient paracentesis that showed changes consistent with SBP -unfortunately, these results were not forwarded to the primary gastroenterologist and she was not started on any antibiotic treatment.  She was started on ceftriaxone and  has presented improvement of her symptoms since then.  She will need to be treated as a regular episode of SBP, for which she will need to finish a 7-day course with a third-generation cephalosporin (can switch to Vantin to complete a 7-day course) and will need to continue with ciprofloxacin prophylaxis 500 mg daily indefinitely.   In terms of her decompensated liver cirrhosis, she has not presented any other decompensating events.  Her recurrent  ascites could be related to the presence of SBP.  She was referred by interventional radiology to the portal hypertension clinic, I advised her to follow-up with them in the clinic  - TIPS could be considered as an option if her MELD improves. MELD 3.0 today remains elevated at 23 - will need to follow closely with transplant hepatology at [facility].   She was found to have grade 2 esophageal varices in the past which have been managed with low-dose nadolol.  I do not suspect she has been having an upper bleeding based on her clinical symptoms and the fact that she has remained hemodynamically stable.  Could consider uptitrating the nonselective beta-blocker as outpatient.  She was noted to have a drop in her hemoglobin down to 5.8 which could be related to her hemorrhoidal bleeding.  If she were to have persistent drop in her hemoglobin despite transfusion today, can consider doing an EGD tomorrow.  Will keep her n.p.o. after midnight.   The patient had presence of hepatic encephalopathy which has been treated with lactulose.  She is at goal in terms of bowel movements and is not present any clinical encephalopathy or asterixis.  She should continue with lactulose TID for now.   Finally, her AKI has improved with the use of albumin, which she will be continued for now and she will receive the third dose of 75 g today as part of the protocol for SBP.   - Continue ceftriaxone 1 g qday, needs to complete 7 day course (can be transitioned to PO Vantin) - Will need indefinite PPX with ciprofloxacin 500 mg qday - start after finishing Vantin - Daily H/H, agree with transfusion today - NPO after MN - Albumin 50 g TID IV - Daily MELD labs - Continue nadolol 10 mg qday - Continue Lactulose TID, titrate to achieve 2-3 Bms per day - Follow up as outpatient with  transplant hepatology at [facility].   Maylon Peppers, MD Gastroenterology and Hepatology [facility]  Gastroenterology

## 2021-12-22 ENCOUNTER — Encounter (HOSPITAL_COMMUNITY)
Admission: EM | Discharge status: Against Medical Advice | Payer: Self-pay | Source: Home / Self Care | Attending: Internal Medicine

## 2021-12-22 ENCOUNTER — Inpatient Hospital Stay (HOSPITAL_COMMUNITY): Payer: Medicaid Other | Admitting: Anesthesiology

## 2021-12-22 DIAGNOSIS — T182XXA Foreign body in stomach, initial encounter: Secondary | ICD-10-CM

## 2021-12-22 DIAGNOSIS — I8500 Esophageal varices without bleeding: Secondary | ICD-10-CM | POA: Diagnosis not present

## 2021-12-22 DIAGNOSIS — E8809 Other disorders of plasma-protein metabolism, not elsewhere classified: Secondary | ICD-10-CM | POA: Diagnosis not present

## 2021-12-22 DIAGNOSIS — N179 Acute kidney failure, unspecified: Secondary | ICD-10-CM | POA: Diagnosis not present

## 2021-12-22 DIAGNOSIS — E876 Hypokalemia: Secondary | ICD-10-CM | POA: Diagnosis not present

## 2021-12-22 DIAGNOSIS — D500 Iron deficiency anemia secondary to blood loss (chronic): Secondary | ICD-10-CM | POA: Diagnosis not present

## 2021-12-22 DIAGNOSIS — K7031 Alcoholic cirrhosis of liver with ascites: Secondary | ICD-10-CM | POA: Diagnosis not present

## 2021-12-22 HISTORY — PX: ESOPHAGOGASTRODUODENOSCOPY (EGD) WITH PROPOFOL: SHX5813

## 2021-12-22 LAB — CULTURE, BODY FLUID W GRAM STAIN -BOTTLE: Special Requests: 10

## 2021-12-22 LAB — BASIC METABOLIC PANEL
Anion gap: 6 (ref 5–15)
BUN: 8 mg/dL (ref 6–20)
CO2: 19 mmol/L — ABNORMAL LOW (ref 22–32)
Calcium: 8.7 mg/dL — ABNORMAL LOW (ref 8.9–10.3)
Chloride: 109 mmol/L (ref 98–111)
Creatinine, Ser: 0.73 mg/dL (ref 0.44–1.00)
GFR, Estimated: >60 mL/min (ref >60)
Glucose, Bld: 111 mg/dL — ABNORMAL HIGH (ref 70–99)
Potassium: 3.7 mmol/L (ref 3.5–5.1)
Sodium: 134 mmol/L — ABNORMAL LOW (ref 135–145)

## 2021-12-22 LAB — CBC
HCT: 20.3 % — ABNORMAL LOW (ref 36.0–46.0)
Hemoglobin: 6.5 g/dL — CL (ref 12.0–15.0)
MCH: 28.9 pg (ref 26.0–34.0)
MCHC: 32 g/dL (ref 30.0–36.0)
MCV: 90.2 fL (ref 80.0–100.0)
Platelets: 95 10*3/uL — ABNORMAL LOW (ref 150–400)
RBC: 2.25 MIL/uL — ABNORMAL LOW (ref 3.87–5.11)
RDW: 16.8 % — ABNORMAL HIGH (ref 11.5–15.5)
WBC: 3.6 10*3/uL — ABNORMAL LOW (ref 4.0–10.5)
nRBC: 0 % (ref 0.0–0.2)

## 2021-12-22 LAB — PREPARE RBC (CROSSMATCH)

## 2021-12-22 LAB — MAGNESIUM: Magnesium: 1.4 mg/dL — ABNORMAL LOW (ref 1.7–2.4)

## 2021-12-22 SURGERY — ESOPHAGOGASTRODUODENOSCOPY (EGD) WITH PROPOFOL
Anesthesia: General

## 2021-12-22 MED ORDER — SODIUM CHLORIDE 0.9% IV SOLUTION: Freq: Once | INTRAVENOUS | Status: AC

## 2021-12-22 MED ORDER — PROPOFOL 10 MG/ML IV BOLUS
INTRAVENOUS | Status: DC | PRN
  Administered 2021-12-22: 180 mg via INTRAVENOUS

## 2021-12-22 MED ORDER — CIPROFLOXACIN HCL 250 MG PO TABS
500.0000 mg | ORAL_TABLET | Freq: Two times a day (BID) | ORAL | Status: DC
  Filled 2021-12-22: qty 1

## 2021-12-22 MED ORDER — NADOLOL 40 MG PO TABS
20.0000 mg | ORAL_TABLET | Freq: Every day | ORAL | Status: DC
  Filled 2021-12-22 (×3): qty 1

## 2021-12-22 MED ORDER — SODIUM CHLORIDE 0.9 % IV SOLN: INTRAVENOUS | Status: DC

## 2021-12-22 MED ORDER — LACTATED RINGERS IV SOLN: INTRAVENOUS | Status: DC | PRN

## 2021-12-22 MED ORDER — CIPROFLOXACIN HCL 250 MG PO TABS
500.0000 mg | ORAL_TABLET | Freq: Every day | ORAL | Status: DC
  Filled 2021-12-22: qty 1

## 2021-12-22 MED ORDER — NADOLOL 20 MG PO TABS: 20.0000 mg | ORAL_TABLET | Freq: Every day | ORAL | 0 refills | Status: DC

## 2021-12-22 MED ORDER — HYDROCORTISONE ACETATE 25 MG RE SUPP: 25.0000 mg | Freq: Two times a day (BID) | RECTAL | 1 refills | Status: DC

## 2021-12-22 MED ORDER — CONSTULOSE 10 GM/15ML PO SOLN: 20.0000 g | Freq: Three times a day (TID) | ORAL | 0 refills | Status: DC

## 2021-12-22 MED ORDER — MAGNESIUM SULFATE 4 GM/100ML IV SOLN
4.0000 g | Freq: Once | INTRAVENOUS | Status: AC
  Administered 2021-12-22: 4 g via INTRAVENOUS
  Filled 2021-12-22: qty 100

## 2021-12-22 MED ORDER — CIPROFLOXACIN HCL 500 MG PO TABS: ORAL_TABLET | ORAL | 0 refills | Status: DC

## 2021-12-22 NOTE — Op Note (Signed)
[facility] Patient Name: Erica Banks Procedure Date: 12/22/2021 12:32 PM MRN: [id number] Date of Birth: July 28, 1980 Attending MD: Katrinka Blazing , , [id number] CSN: [id number] Age: 41 Admit Type: Inpatient Procedure:                Upper GI endoscopy Indications:              Iron deficiency anemia Providers:                Katrinka Blazing, Buel Ream. Thomasena Edis RN, RN, Cyril Mourning, Technician Referring MD:             Katrinka Blazing Medicines:                Monitored Anesthesia Care Complications:            No immediate complications. Estimated Blood Loss:     Estimated blood loss: none. Procedure:                Pre-Anesthesia Assessment:                           - Prior to the procedure, a History and Physical                            was performed, and patient medications, allergies                            and sensitivities were reviewed. The patient's                            tolerance of previous anesthesia was reviewed.                           - The risks and benefits of the procedure and the                            sedation options and risks were discussed with the                            patient. All questions were answered and informed                            consent was obtained.                           - ASA Grade Assessment: III - A patient with severe                            systemic disease.                           After obtaining informed consent, the endoscope was                            passed under direct vision. Throughout  the                            procedure, the patient's blood pressure, pulse, and                            oxygen saturations were monitored continuously. The                            [id number]-[id number] ([id number]) scope was introduced through the                            mouth, and advanced to the antrum of the stomach.                            The upper GI endoscopy was  accomplished without                            difficulty. The patient tolerated the procedure                            well. Scope In: 12:46:33 PM Scope Out: 12:49:07 PM Total Procedure Duration: 0 hours 2 minutes 34 seconds  Findings:      Grade I-II varices were found in the lower third of the esophagus. None       had red wale signs.      A large amount of food (residue) was found in the entire examined       stomach. Upon careful inspection, no presence of hematin or active       bleeding was present, but food did not allow a thorough inspection of       the stomach. Scope was not advanced to the duodenum due to large amount       of food. Impression:               - Grade II esophageal varices.                           - A large amount of food (residue) in the stomach.                           - No specimens collected. Moderate Sedation:      Per Anesthesia Care Recommendation:           - Return patient to hospital ward for ongoing care.                           - Resume previous diet.                           - Check H/H daily.                           - Increase nadolol to 20 mg qday. Procedure Code(s):        --- Professional ---  43235, 52, Esophagogastroduodenoscopy, flexible,                            transoral; diagnostic, including collection of                            specimen(s) by brushing or washing, when performed                            (separate procedure) Diagnosis Code(s):        --- Professional ---                           I85.00, Esophageal varices without bleeding                           D50.9, Iron deficiency anemia, unspecified CPT copyright 2022 American Medical Association. All rights reserved. The codes documented in this report are preliminary and upon coder review may  be revised to meet current compliance requirements. Katrinka Blazing, MD Katrinka Blazing,  12/22/2021 12:55:47 PM This report has been  signed electronically. Number of Addenda: 0

## 2021-12-22 NOTE — Progress Notes (Signed)
We will proceed with EGD as scheduled.  I thoroughly discussed with the patient the procedure, including the risks involved. Patient understands what the procedure involves including the benefits and any risks. Patient understands alternatives to the proposed procedure. Risks including (but not limited to) bleeding, tearing of the lining (perforation), rupture of adjacent organs, problems with heart and lung function, infection, and medication reactions. A small percentage of complications may require surgery, hospitalization, repeat endoscopic procedure, and/or transfusion.  Patient understood and agreed.  Boe Deans Castaneda, MD Gastroenterology and Hepatology [facility]  

## 2021-12-22 NOTE — Progress Notes (Signed)
Lab reported critical result, MD notified    12/22/21 0539  Provider Notification  Provider Name/Title Dr. Carren Rang  Date Provider Notified 12/22/21  Time Provider Notified [phone]  Method of Notification Page  Notification Reason Critical Result  Test performed and critical result HBG 6.5  Date Critical Result Received 12/22/21  Time Critical Result Received [phone]

## 2021-12-22 NOTE — Brief Op Note (Signed)
12/18/2021 - 12/22/2021  12:55 PM  PATIENT:  Erica Banks  41 y.o. 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  Tolerated the procedure adequately.  Esophagus showed Grade I-II varices were found in the lower third of the esophagus. None had red wale signs.. A large amount of food (residue) was found in the entire examined stomach.  Upon careful inspection, no presence of hematin or active bleeding was present, but food did not allow a thorough inspection of the stomach. Scope was not advanced to the duodenum due to large amount of food.  RECOMMENDATIONS - Return patient to hospital ward for ongoing care.  - Resume previous diet.  - Check H/H daily. - Increase nadolol to 20 mg qday.  Katrinka Blazing, MD Gastroenterology and Hepatology [facility] Gastroenterology

## 2021-12-22 NOTE — Anesthesia Postprocedure Evaluation (Signed)
Anesthesia Post Note  Patient: Erica Banks  Procedure(s) Performed: ESOPHAGOGASTRODUODENOSCOPY (EGD) WITH PROPOFOL  Patient location during evaluation: PACU Anesthesia Type: General Level of consciousness: awake and alert Pain management: pain level controlled Vital Signs Assessment: post-procedure vital signs reviewed and stable Respiratory status: spontaneous breathing, nonlabored ventilation, respiratory function stable and patient connected to nasal cannula oxygen Cardiovascular status: blood pressure returned to baseline and stable Postop Assessment: no apparent nausea or vomiting Anesthetic complications: no   No notable events documented.   Last Vitals:  Vitals:   12/22/21 0928 12/22/21 1150  BP: 132/72 108/63  Pulse: 96 88  Resp: 16 18  Temp: 36.8 C 37.6 C  SpO2: 100% 99%    Last Pain:  Vitals:   12/22/21 1242  TempSrc:   PainSc: 0-No pain                 Windell Norfolk

## 2021-12-22 NOTE — Discharge Summary (Signed)
Physician Discharge Summary  Erica Banks ZOX:[id number] DOB: 06-06-1980 DOA: 12/18/2021  PCP: Toma Deiters, MD  Admit date: 12/18/2021 Discharge date: 12/22/2021   Recommendations for Outpatient Follow-up:  PATIENT LEFT THE HOSPITAL AGAINST MEDICAL ADVICE Gastroenterology service will arrange for patient to follow-up with them   Brief/Interim Summary: 41 year old female with a history of alcoholic cirrhosis, admitted to the hospital with abdominal distention, electrolyte derangements and acute kidney injury.  She underwent paracentesis.  GI consulted.  Electrolytes are being replaced.  Ammonia level significantly elevated, continued on lactulose.  Started on Rocephin for prophylactic treatment of SBP pending results of paracentesis.   Discharge Diagnoses:  Principal Problem:   Ascites due to alcoholic cirrhosis (HCC) Active Problems:   Hypokalemia   Intractable nausea and vomiting   Prolonged QT interval   Alcoholic cirrhosis of liver with ascites (HCC)   Hyponatremia   Hypoalbuminemia   Tobacco use disorder   Generalized weakness   AKI (acute kidney injury) (HCC)   Hyperammonemia (HCC)   Hypomagnesemia  Ascites due to alcoholic cirrhosis Spontaneous bacterial peritonitis -Status post paracentesis removal of 1.7 L of fluid on 11/22 -Started on empiric ceftriaxone -Appreciate GI following -Continued on propranolol -Of note, she did have paracentesis on 11/17 that did show elevated WBC count. -Culture from peritoneal fluid growing Staph capitis.  Antibiotics have been changed to ciprofloxacin 500 mg twice daily for treatment followed by 500 mg daily for SBP prophylaxis -She was restarted on home dose of Lasix and Aldactone -She was also started on nadolol   Hyponatremia -Likely related to cirrhosis, poor p.o. intake -Overall improving   Hypokalemia -Improving with replacement   Hypomagnesemia -Replaced   Hyperammonemia -Reports compliance with lactulose at  home -Overall doses been increased since admission -Although ammonia level remains elevated, clinically she is not encephalopathic, no asterixis -Would continue with current management   Acute kidney injury -Creatinine 1.5 on admission, baseline 0.7-0.9 -She was due to albumin infusions and overall creatinine has improved to 0.78   Generalized weakness -Seen by PT/OT with no further follow-up recommended   Anemia GI bleeding -Has been getting iron infusions as an outpatient -Hemoglobin trended down to 5.8 -She was transfused 1 unit PRBC 11/24 -Follow-up hemoglobin improved to 7.5, but then trended down again to 6.5 -She was transfused a second unit PRBC on 11/25 She did report noticing blood in her stools -She underwent EGD by GI.  Procedure showed a large amount of food residue in her stomach.  No presence of hematin or active bleeding was present, with thorough inspection of stomach could not be performed due to food residue.  Scope was not advanced into the duodenum due to large amount of food. -Patient felt that her bleeding may have been related to hemorrhoids.  She was prescribed on Anusol suppositories  Patient was very insistent on leaving on 11/25.  It was recommended that she stay in the hospital since she continues to have unstable hemoglobin requiring PRBC transfusions.  Risks of leaving the hospital AGAINST MEDICAL ADVICE were explained including worsening of her condition as well as possible death.  Patient expressed understanding, but persisted in her desired to leave AGAINST MEDICAL ADVICE.  She reports that she will follow-up for hemoglobin with hematology clinic.  She says she will return to the hospital if she has worsening bleeding.  Her husband was also present for conversation.  Discharge Instructions   Allergies as of 12/22/2021       Reactions   Demerol Anaphylaxis  Per patient cardiac arrest        Medication List     STOP taking these medications     ibuprofen 800 MG tablet Commonly known as: ADVIL   propranolol 10 MG tablet Commonly known as: INDERAL       TAKE these medications    ciprofloxacin 500 MG tablet Commonly known as: CIPRO Take 1 tab po bid for 7 days then 1 tab po daily   Constulose 10 GM/15ML solution Generic drug: lactulose Take 30 mLs (20 g total) by mouth 3 (three) times daily.   furosemide 40 MG tablet Commonly known as: LASIX Take 1 tablet (40 mg total) by mouth daily.   gabapentin 300 MG capsule Commonly known as: NEURONTIN Take 300 mg by mouth 3 (three) times daily as needed (nerve pain).   hydrocortisone 25 MG suppository Commonly known as: ANUSOL-HC Place 1 suppository (25 mg total) rectally every 12 (twelve) hours.   hydrOXYzine 25 MG capsule Commonly known as: VISTARIL Take 25 mg by mouth 3 (three) times daily as needed for anxiety.   methocarbamol 500 MG tablet Commonly known as: ROBAXIN Take 500 mg by mouth at bedtime as needed for muscle spasms.   metoCLOPramide 10 MG tablet Commonly known as: REGLAN Take 1 tablet (10 mg total) by mouth every 8 (eight) hours as needed for nausea or vomiting.   nadolol 20 MG tablet Commonly known as: CORGARD Take 1 tablet (20 mg total) by mouth daily.   ondansetron 4 MG disintegrating tablet Commonly known as: ZOFRAN-ODT Take 1 tablet (4 mg total) by mouth every 8 (eight) hours as needed for nausea or vomiting.   pantoprazole 40 MG tablet Commonly known as: PROTONIX Take 1 tablet (40 mg total) by mouth 2 (two) times daily before a meal.   spironolactone 100 MG tablet Commonly known as: ALDACTONE Take 1 tablet (100 mg total) by mouth daily.        Allergies  Allergen Reactions   Demerol Anaphylaxis    Per patient cardiac arrest    Consultations: Gastroenterology   Procedures/Studies: US Paracentesis  Result Date: 12/19/2021 INDICATION: Alcohol Cirrhosis; recurrent ascites EXAM: ULTRASOUND GUIDED RLQ PARACENTESIS  MEDICATIONS: 10 cc 1% lidocaine COMPLICATIONS: None immediate. PROCEDURE: Informed written consent was obtained from the patient after a discussion of the risks, benefits and alternatives to treatment. A timeout was performed prior to the initiation of the procedure. Initial ultrasound scanning demonstrates a large amount of ascites within the right lower abdominal quadrant. The right lower abdomen was prepped and draped in the usual sterile fashion. 1% lidocaine was used for local anesthesia. Following this, a Yueh catheter was introduced. An ultrasound image was saved for documentation purposes. The paracentesis was performed. The catheter was removed and a dressing was applied. The patient tolerated the procedure well without immediate post procedural complication. Patient received post-procedure intravenous albumin; see nursing notes for details. FINDINGS: A total of approximately 1.7 liters of yellow fluid was removed. Samples were sent to the laboratory as requested by the clinical team. IMPRESSION: Successful ultrasound-guided paracentesis yielding 1.7 liters of peritoneal fluid. PLAN: The patient has required >/=2 paracenteses in a 30 day period and a formal evaluation by the [facility] has been arranged. Read by Robet Leu [facility] Electronically Signed   By: Marliss Coots M.D.   On: 12/19/2021 14:09   DG Chest Port 1 View  Result Date: 12/18/2021 CLINICAL DATA:  Shortness of breath EXAM: PORTABLE CHEST 1 VIEW COMPARISON:  Chest x-ray 11/22/2021 FINDINGS: The heart size and mediastinal contours are within normal limits. Both lungs are clear. The visualized skeletal structures are unremarkable. IMPRESSION: No active disease. Electronically Signed   By: Darliss Cheney M.D.   On: 12/18/2021 21:49   US Paracentesis  Result Date: 12/14/2021 INDICATION: Alcoholic cirrhosis, ascites EXAM: ULTRASOUND GUIDED DIAGNOSTIC AND THERAPEUTIC PARACENTESIS  MEDICATIONS: None. COMPLICATIONS: None immediate. PROCEDURE: Informed written consent was obtained from the patient after a discussion of the risks, benefits and alternatives to treatment. A timeout was performed prior to the initiation of the procedure. Initial ultrasound scanning demonstrates a large amount of ascites within the right lower abdominal quadrant. The right lower abdomen was prepped and draped in the usual sterile fashion. 1% lidocaine was used for local anesthesia. Following this, a 19 gauge, 7-cm, Yueh catheter was introduced. An ultrasound image was saved for documentation purposes. The paracentesis was performed. The catheter was removed and a dressing was applied. The patient tolerated the procedure well without immediate post procedural complication. FINDINGS: A total of approximately 3.3 L of yellow ascitic fluid was removed. Samples were sent to the laboratory as requested by the clinical team. IMPRESSION: Successful ultrasound-guided paracentesis yielding 3.3 liters of peritoneal fluid. Electronically Signed   By: Ulyses Southward M.D.   On: 12/14/2021 11:41   CT ABDOMEN PELVIS W CONTRAST  Result Date: 12/03/2021 CLINICAL DATA:  Epigastric abdominal pain, vomiting since 2 a.m. EXAM: CT ABDOMEN AND PELVIS WITH CONTRAST TECHNIQUE: Multidetector CT imaging of the abdomen and pelvis was performed using the standard protocol following bolus administration of intravenous contrast. RADIATION DOSE REDUCTION: This exam was performed according to the departmental dose-optimization program which includes automated exposure control, adjustment of the mA and/or kV according to patient size and/or use of iterative reconstruction technique. CONTRAST:  OMNIPAQUE IOHEXOL 300 MG/ML  SOLN COMPARISON:  MRI November 26, 2021 FINDINGS: Lower chest: No acute abnormality. Hepatobiliary: Similar cirrhotic hepatic morphology. Portal, splenic and superior mesenteric veins are patent. Gallbladder surgically absent.  Pancreas: Pancreatic ductal dilation or evidence of acute inflammation. Spleen: Splenomegaly measuring 20 cm in maximum axial dimension. Adrenals/Urinary Tract: Bilateral adrenal glands appear normal. No hydronephrosis. Kidneys demonstrate symmetric enhancement. Urinary bladder is nondistended limiting evaluation. Stomach/Bowel: Gastric wall thickening. Wall thickening of the duodenum and proximal jejunum. Pathologic dilation of small or large bowel. Marked wall thickening of the ascending and transverse colon with mild descending colonic wall thickening. Vascular/Lymphatic: Normal caliber abdominal aorta. Smooth IVC contours. Portosystemic collateral vessels including recannulized paraumbilical vein, paraesophageal varices gastric varices. Reproductive: Status post hysterectomy. No adnexal masses. Other: Small volume abdominopelvic free fluid. Musculoskeletal: No acute osseous abnormality. IMPRESSION: 1. Similar cirrhotic hepatic morphology with sequela of portal venous hypertension including small volume abdominopelvic free fluid splenomegaly and portosystemic collateral vessels. 2. 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 However, an infectious or inflammatory colitis could appears similar. Electronically Signed   By: Maudry Mayhew M.D.   On: 12/03/2021 13:08   MR LIVER W WO CONTRAST  Result Date: 11/28/2021 CLINICAL DATA:  Cirrhosis.  Follow-up indeterminate liver lesion. EXAM: MRI ABDOMEN WITHOUT AND WITH CONTRAST TECHNIQUE: Multiplanar multisequence MR imaging of the abdomen was performed both before and after the administration of intravenous contrast. CONTRAST:  46mL GADAVIST GADOBUTROL 1 MMOL/ML IV SOLN COMPARISON:  05/03/2021 FINDINGS: Lower chest: No acute findings. Hepatobiliary: Hepatic cirrhosis is again demonstrated. Diffusely heterogeneous pattern of enhancement is  again  seen throughout the liver, however the previously seen tiny focus of arterial phase hyperenhancement in the inferior right hepatic lobe is no longer visualized. No other suspicious hepatic lesions are identified. Prior cholecystectomy. No evidence of biliary obstruction. Pancreas:  No mass or inflammatory changes. Spleen: Stable severe splenomegaly, consistent with portal venous hypertension. No splenic masses identified. Adrenals/Urinary Tract: No suspicious masses identified. No evidence of hydronephrosis. Stomach/Bowel: Unremarkable. Vascular/Lymphatic: No pathologically enlarged lymph nodes identified. No acute vascular findings. Recanalization of paraumbilical veins and other upper abdominal portosystemic venous collaterals are again seen consistent portal venous hypertension. Other: Mild to moderate ascites and diffuse mesenteric edema have increased since prior exam. Musculoskeletal:  No suspicious bone lesions identified. IMPRESSION: Hepatic cirrhosis. Previously seen tiny focus of arterial phase hyperenhancement in the inferior right hepatic lobe is no longer visualized. No suspicious liver lesions identified. Stable findings of portal venous hypertension, including severe splenomegaly. Mild to moderate ascites and diffuse mesenteric edema, increased since prior exam. Electronically Signed   By: Danae Orleans M.D.   On: 11/28/2021 09:38   [place] ASCITES (ABDOMEN LIMITED)  Result Date: 11/26/2021 CLINICAL DATA:  Ascites, question sufficient for paracentesis EXAM: LIMITED ABDOMEN ULTRASOUND FOR ASCITES TECHNIQUE: Limited ultrasound survey for ascites was performed in all four abdominal quadrants. COMPARISON:  03/30/2021 FINDINGS: Minimal ascites, predominantly perihepatic. Insufficient ascites for paracentesis. IMPRESSION: Minimal ascites, insufficient for paracentesis. Electronically Signed   By: Ulyses Southward M.D.   On: 11/26/2021 11:10   DG Chest 2 View  Result Date: 11/22/2021 CLINICAL DATA:   Chest pain EXAM: CHEST - 2 VIEW COMPARISON:  Chest 12/30/2016 FINDINGS: Heart size and vascularity normal. Lungs clear without infiltrate or pneumonia. Minimal pleural effusion on the lateral view not seen previously. IMPRESSION: Minimal pleural effusion.  Negative for pneumonia Electronically Signed   By: Marlan Palau M.D.   On: 11/22/2021 20:37        The results of significant diagnostics from this hospitalization (including imaging, microbiology, ancillary and laboratory) are listed below for reference.     Microbiology: Recent Results (from the past 240 hour(s))  Gram stain     Status: None   Collection Time: 12/14/21 10:20 AM   Specimen: Ascitic  Result Value Ref Range Status   Specimen Description ASCITIC  Final   Special Requests NONE  Final   Gram Stain   Final    CYTOSPIN SMEAR NO ORGANISMS SEEN WBC PRESENT,BOTH PMN AND MONONUCLEAR Performed at [facility], [address], [place], [place] [address]    Report Status 12/15/2021 FINAL  Final  Culture, body fluid w Gram Stain-bottle     Status: None   Collection Time: 12/14/21 10:20 AM   Specimen: Ascitic  Result Value Ref Range Status   Specimen Description ASCITIC  Final   Special Requests PERITONEAL BOTTLES DRAWN AEROBIC AND ANAEROBIC  Final   Gram Stain   Final    NO ORGANISMS SEEN CYTOSPIN SMEAR WBC PRESENT,BOTH PMN AND MONONUCLEAR   Culture   Final    NO GROWTH 6 DAYS Performed at [facility], [address], [place], [place] [address]    Report Status 12/20/2021 FINAL  Final  Resp Panel by RT-PCR (Flu A&B, Covid) Anterior Nasal Swab     Status: None   Collection Time: 12/18/21  9:05 PM   Specimen: Anterior Nasal Swab  Result Value Ref Range Status   SARS Coronavirus 2 by RT PCR NEGATIVE NEGATIVE Final    Comment: (NOTE) SARS-CoV-2 target nucleic acids are NOT DETECTED.  The SARS-CoV-2 RNA is generally detectable in upper respiratory specimens during the acute phase of infection. The lowest concentration  of SARS-CoV-2 viral copies this assay can detect is 138 copies/mL. A negative result does not preclude SARS-Cov-2 infection and should not be used as the sole basis for treatment or other patient management decisions. A negative result may occur with  improper specimen collection/handling, submission of specimen other than nasopharyngeal swab, presence of viral mutation(s) within the areas targeted by this assay, and inadequate number of viral copies(<138 copies/mL). A negative result must be combined with clinical observations, patient history, and epidemiological information. The expected result is Negative.  Fact Sheet for Patients:  BloggerCourse.com  Fact Sheet for Healthcare Providers:  SeriousBroker.it  This test is no t yet approved or cleared by the [place] FDA and  has been authorized for detection and/or diagnosis of SARS-CoV-2 by FDA under an Emergency Use Authorization (EUA). This EUA will remain  in effect (meaning this test can be used) for the duration of the COVID-19 declaration under Section 564(b)(1) of the Act, 21 U.S.C.section 360bbb-3(b)(1), unless the authorization is terminated  or revoked sooner.       Influenza A by PCR NEGATIVE NEGATIVE Final   Influenza B by PCR NEGATIVE NEGATIVE Final    Comment: (NOTE) The Xpert Xpress SARS-CoV-2/FLU/RSV plus assay is intended as an aid in the diagnosis of influenza from Nasopharyngeal swab specimens and should not be used as a sole basis for treatment. Nasal washings and aspirates are unacceptable for Xpert Xpress SARS-CoV-2/FLU/RSV testing.  Fact Sheet for Patients: BloggerCourse.com  Fact Sheet for Healthcare Providers: SeriousBroker.it  This test is not yet approved or cleared by the [place] FDA and has been authorized for detection and/or diagnosis of SARS-CoV-2 by FDA under an Emergency Use  Authorization (EUA). This EUA will remain in effect (meaning this test can be used) for the duration of the COVID-19 declaration under Section 564(b)(1) of the Act, 21 U.S.C. section 360bbb-3(b)(1), unless the authorization is terminated or revoked.  Performed at [facility], [address], [place], [place] [address]   Culture, body fluid w Gram Stain-bottle     Status: Abnormal   Collection Time: 12/19/21 12:00 PM   Specimen: Peritoneal Washings  Result Value Ref Range Status   Specimen Description   Final    PERITONEAL Performed at [facility], [address] [place], [place] [address]    Special Requests   Final    10 CC Performed at [facility], [address], [place], [place] [address]    Gram Stain   Final    GRAM POSITIVE COCCI ANAEROBIC BOTTLE ONLY Gram Stain Report Called to,Read Back By and Verified With: TERRA Ernest Mallick, LPN  12/20/21  BY [facility] Performed at [facility], [address] [place], [place] [address]    Culture STAPHYLOCOCCUS CAPITIS (A)  Final   Report Status 12/22/2021 FINAL  Final   Organism ID, Bacteria STAPHYLOCOCCUS CAPITIS  Final      Susceptibility   Staphylococcus capitis - MIC*    CIPROFLOXACIN <=0.5 SENSITIVE Sensitive     ERYTHROMYCIN 0.5 SENSITIVE Sensitive     GENTAMICIN <=0.5 SENSITIVE Sensitive     OXACILLIN <=0.25 SENSITIVE Sensitive     TETRACYCLINE <=1 SENSITIVE Sensitive     VANCOMYCIN 1 SENSITIVE Sensitive     TRIMETH/SULFA <=10 SENSITIVE Sensitive     CLINDAMYCIN <=0.25 SENSITIVE Sensitive     RIFAMPIN <=0.5 SENSITIVE Sensitive     Inducible Clindamycin NEGATIVE Sensitive     *  STAPHYLOCOCCUS CAPITIS  Gram stain     Status: None   Collection Time: 12/19/21 12:00 PM   Specimen: Peritoneal Washings  Result Value Ref Range Status   Specimen Description PERITONEAL  Final   Special Requests ASCITIES  Final   Gram Stain   Final    NO ORGANISMS SEEN WBC PRESENT,BOTH PMN AND MONONUCLEAR CYTOSPIN SMEAR Performed at [facility], [address] [place], [place] [address]    Report Status 12/19/2021 FINAL  Final     Labs: BNP (last 3 results) No results for input(s): "BNP" in the last 8760 hours. Basic Metabolic Panel: Recent Labs  Lab 12/18/21 1830 12/18/21 2118 12/19/21 0254 12/20/21 0316 12/21/21 0257 12/22/21 0358  NA 129*  --  131* 132* 131* 134*  K 2.8*  --  3.4* 3.7 3.1* 3.7  CL 101  --  106 110 110 109  CO2 19*  --  19* 17* 17* 19*  GLUCOSE 136*  --  114* 119* 159* 111*  BUN 24*  --  CREATININE 1.53*  --  1.33* 1.00 0.78 0.73  CALCIUM 8.1*  --  7.9* 7.9* 7.9* 8.7*  MG  --  1.6* 2.3  --  1.7 1.4*  PHOS  --   --  2.6  --   --   --    Liver Function Tests: Recent Labs  Lab 12/18/21 2118 12/19/21 0254 12/20/21 0316 12/21/21 0257  AST 100* 89* 90* 80*  ALT 41 36 36 32  ALKPHOS 96 85 86 69  BILITOT 2.9* 2.5* 2.4* 2.2*  PROT 5.8* 5.5* 5.9* 5.4*  ALBUMIN 2.6* 2.7* 3.1* 3.1*   No results for input(s): "LIPASE", "AMYLASE" in the last 168 hours. Recent Labs  Lab 12/18/21 2119 12/20/21 0316  AMMONIA 150* 149*   CBC: Recent Labs  Lab 12/19/21 0254 12/20/21 0316 12/21/21 0257 12/21/21 1419 12/22/21 0358  WBC 4.1 4.2 3.0* 3.9* 3.6*  NEUTROABS  --   --   --  2.0  --   HGB 7.0* 7.3* 5.8* 7.5* 6.5*  HCT 20.8* 21.7* 18.4* 23.6* 20.3*  MCV 88.1 89.3 91.1 90.4 90.2  PLT 107* 115* 96* 101* 95*   Cardiac Enzymes: No results for input(s): "CKTOTAL", "CKMB", "CKMBINDEX", "TROPONINI" in the last 168 hours. BNP: Invalid input(s): "POCBNP" CBG: No results for input(s): "GLUCAP" in the last 168 hours. D-Dimer No results for input(s): "DDIMER" in the last 72 hours. Hgb A1c No results for input(s): "HGBA1C" in the last 72 hours. Lipid Profile No results for input(s): "CHOL", "HDL", "LDLCALC", "TRIG", "CHOLHDL", "LDLDIRECT" in the last 72 hours. Thyroid function studies No results for input(s): "TSH", "T4TOTAL", "T3FREE", "THYROIDAB" in the last 72 hours.  Invalid input(s):  "FREET3" Anemia work up No results for input(s): "VITAMINB12", "FOLATE", "FERRITIN", "TIBC", "IRON", "RETICCTPCT" in the last 72 hours. Urinalysis    Component Value Date/Time   COLORURINE YELLOW 12/18/2021 2128   APPEARANCEUR CLEAR 12/18/2021 2128   LABSPEC 1.009 12/18/2021 2128   PHURINE 6.0 12/18/2021 2128   GLUCOSEU NEGATIVE 12/18/2021 2128   HGBUR NEGATIVE 12/18/2021 2128   BILIRUBINUR NEGATIVE 12/18/2021 2128   KETONESUR NEGATIVE 12/18/2021 2128   PROTEINUR NEGATIVE 12/18/2021 2128   UROBILINOGEN 0.2 04/10/2013 1432   NITRITE NEGATIVE 12/18/2021 2128   LEUKOCYTESUR NEGATIVE 12/18/2021 2128   Sepsis Labs Recent Labs  Lab 12/20/21 0316 12/21/21 0257 12/21/21 1419 12/22/21 0358  WBC 4.2 3.0* 3.9* 3.6*   Microbiology Recent Results (from the past 240 hour(s))  Gram stain     Status: None   Collection Time: 12/14/21 10:20 AM   Specimen: Ascitic  Result Value Ref Range Status   Specimen Description ASCITIC  Final   Special Requests NONE  Final   Gram Stain   Final    CYTOSPIN SMEAR NO ORGANISMS SEEN WBC PRESENT,BOTH PMN AND MONONUCLEAR Performed at [facility], [address] [place], [place] [address]    Report Status 12/15/2021 FINAL  Final  Culture, body fluid w Gram Stain-bottle     Status: None   Collection Time: 12/14/21 10:20 AM   Specimen: Ascitic  Result Value Ref Range Status   Specimen Description ASCITIC  Final   Special Requests PERITONEAL BOTTLES DRAWN AEROBIC AND ANAEROBIC  Final   Gram Stain   Final    NO ORGANISMS SEEN CYTOSPIN SMEAR WBC PRESENT,BOTH PMN AND MONONUCLEAR   Culture   Final    NO GROWTH 6 DAYS Performed at [facility], [address], [place], [place] [address]    Report Status 12/20/2021 FINAL  Final  Resp Panel by RT-PCR (Flu A&B, Covid) Anterior Nasal Swab     Status: None   Collection Time: 12/18/21  9:05 PM   Specimen: Anterior Nasal Swab  Result Value Ref Range Status   SARS Coronavirus 2 by RT PCR NEGATIVE NEGATIVE  Final    Comment: (NOTE) SARS-CoV-2 target nucleic acids are NOT DETECTED.  The SARS-CoV-2 RNA is generally detectable in upper respiratory specimens during the acute phase of infection. The lowest concentration of SARS-CoV-2 viral copies this assay can detect is 138 copies/mL. A negative result does not preclude SARS-Cov-2 infection and should not be used as the sole basis for treatment or other patient management decisions. A negative result may occur with  improper specimen collection/handling, submission of specimen other than nasopharyngeal swab, presence of viral mutation(s) within the areas targeted by this assay, and inadequate number of viral copies(<138 copies/mL). A negative result must be combined with clinical observations, patient history, and epidemiological information. The expected result is Negative.  Fact Sheet for Patients:  BloggerCourse.com  Fact Sheet for Healthcare Providers:  SeriousBroker.it  This test is no t yet approved or cleared by the [place] FDA and  has been authorized for detection and/or diagnosis of SARS-CoV-2 by FDA under an Emergency Use Authorization (EUA). This EUA will remain  in effect (meaning this test can be used) for the duration of the COVID-19 declaration under Section 564(b)(1) of the Act, 21 U.S.C.section 360bbb-3(b)(1), unless the authorization is terminated  or revoked sooner.       Influenza A by PCR NEGATIVE NEGATIVE Final   Influenza B by PCR NEGATIVE NEGATIVE Final    Comment: (NOTE) The Xpert Xpress SARS-CoV-2/FLU/RSV plus assay is intended as an aid in the diagnosis of influenza from Nasopharyngeal swab specimens and should not be used as a sole basis for treatment. Nasal washings and aspirates are unacceptable for Xpert Xpress SARS-CoV-2/FLU/RSV testing.  Fact Sheet for Patients: BloggerCourse.com  Fact Sheet for Healthcare  Providers: SeriousBroker.it  This test is not yet approved or cleared by the [place] FDA and has been authorized for detection and/or diagnosis of SARS-CoV-2 by FDA under an Emergency Use Authorization (EUA). This EUA will remain in effect (meaning this test can be used) for the duration of the COVID-19 declaration under Section 564(b)(1) of the Act, 21 U.S.C. section 360bbb-3(b)(1), unless the authorization is terminated or revoked.  Performed at [facility], [address], [place], [place]  [address]   Culture, body fluid w Gram Stain-bottle     Status: Abnormal   Collection Time: 12/19/21 12:00 PM   Specimen: Peritoneal Washings  Result Value Ref Range Status   Specimen Description   Final    PERITONEAL Performed at [facility], [address], [place], [place] [address]    Special Requests   Final    10 CC Performed at [facility], [address] [place], [place] [address]    Gram Stain   Final    GRAM POSITIVE COCCI ANAEROBIC BOTTLE ONLY Gram Stain Report Called to,Read Back By and Verified With: TERRA Ernest Mallick, LPN @1336  12/20/21  BY [employer] Performed at [facility], [address] [place], [place] [address]    Culture STAPHYLOCOCCUS CAPITIS (A)  Final   Report Status 12/22/2021 FINAL  Final   Organism ID, Bacteria STAPHYLOCOCCUS CAPITIS  Final      Susceptibility   Staphylococcus capitis - MIC*    CIPROFLOXACIN <=0.5 SENSITIVE Sensitive     ERYTHROMYCIN 0.5 SENSITIVE Sensitive     GENTAMICIN <=0.5 SENSITIVE Sensitive     OXACILLIN <=0.25 SENSITIVE Sensitive     TETRACYCLINE <=1 SENSITIVE Sensitive     VANCOMYCIN 1 SENSITIVE Sensitive     TRIMETH/SULFA <=10 SENSITIVE Sensitive     CLINDAMYCIN <=0.25 SENSITIVE Sensitive     RIFAMPIN <=0.5 SENSITIVE Sensitive     Inducible Clindamycin NEGATIVE Sensitive     * STAPHYLOCOCCUS CAPITIS  Gram stain     Status: None   Collection Time: 12/19/21 12:00 PM   Specimen: Peritoneal Washings  Result  Value Ref Range Status   Specimen Description PERITONEAL  Final   Special Requests ASCITIES  Final   Gram Stain   Final    NO ORGANISMS SEEN WBC PRESENT,BOTH PMN AND MONONUCLEAR CYTOSPIN SMEAR Performed at [facility], [address], [place], [place] [address]    Report Status 12/19/2021 FINAL  Final     Time coordinating discharge:  SIGNED:   Erick Blinks, MD  [facility] 12/22/2021, 6:45 PM   If 7PM-7AM, please contact night-coverage [email or web address]

## 2021-12-22 NOTE — Anesthesia Preprocedure Evaluation (Signed)
Anesthesia Evaluation  Patient identified by MRN, date of birth, ID band Patient awake    Reviewed: Allergy & Precautions, H&P , NPO status , Patient's Chart, lab work & pertinent test results, reviewed documented beta blocker date and time   Airway Mallampati: II  TM Distance: >3 FB Neck ROM: full    Dental no notable dental hx.    Pulmonary neg pulmonary ROS, Current Smoker   Pulmonary exam normal breath sounds clear to auscultation       Cardiovascular Exercise Tolerance: Good negative cardio ROS  Rhythm:regular Rate:Normal     Neuro/Psych   Anxiety     negative neurological ROS  negative psych ROS   GI/Hepatic Neg liver ROS,GERD  Medicated,,  Endo/Other  negative endocrine ROS    Renal/GU negative Renal ROS  negative genitourinary   Musculoskeletal   Abdominal   Peds  Hematology  (+) Blood dyscrasia, anemia   Anesthesia Other Findings   Reproductive/Obstetrics negative OB ROS                             Anesthesia Physical Anesthesia Plan  ASA: 4 and emergent  Anesthesia Plan: General   Post-op Pain Management:    Induction:   PONV Risk Score and Plan: Propofol infusion  Airway Management Planned:   Additional Equipment:   Intra-op Plan:   Post-operative Plan:   Informed Consent: I have reviewed the patients History and Physical, chart, labs and discussed the procedure including the risks, benefits and alternatives for the proposed anesthesia with the patient or authorized representative who has indicated his/her understanding and acceptance.     Dental Advisory Given  Plan Discussed with: CRNA  Anesthesia Plan Comments:        Anesthesia Quick Evaluation

## 2021-12-22 NOTE — Transfer of Care (Signed)
Immediate Anesthesia Transfer of Care Note  Patient: Erica Banks  Procedure(s) Performed: ESOPHAGOGASTRODUODENOSCOPY (EGD) WITH PROPOFOL  Patient Location: PACU  Anesthesia Type:General  Level of Consciousness: drowsy  Airway & Oxygen Therapy: Patient Spontanous Breathing  Post-op Assessment: Report given to RN and Post -op Vital signs reviewed and stable  Post vital signs: Reviewed and stable  Last Vitals:  Vitals Value Taken Time  BP 127/77   Temp 98   Pulse 92   Resp 18   SpO2 96     Last Pain:  Vitals:   12/22/21 1242  TempSrc:   PainSc: 0-No pain         Complications: No notable events documented.

## 2021-12-22 NOTE — Progress Notes (Signed)
Pt left AMA. MD made aware. AMA paper signed.

## 2021-12-23 LAB — TYPE AND SCREEN
ABO/RH(D): O POS
Antibody Screen: NEGATIVE
Unit division: 0
Unit division: 0

## 2021-12-23 LAB — BPAM RBC
Blood Product Expiration Date: 202312142359
Blood Product Expiration Date: 202312262359
ISSUE DATE / TIME: 202311240903
ISSUE DATE / TIME: 202311250905
Unit Type and Rh: 5100
Unit Type and Rh: 5100

## 2021-12-24 LAB — PH, BODY FLUID: pH, Body Fluid: 7.4

## 2021-12-25 LAB — CYTOLOGY - NON PAP

## 2021-12-26 ENCOUNTER — Ambulatory Visit: Payer: Medicaid Other | Admitting: Gastroenterology

## 2021-12-26 ENCOUNTER — Telehealth: Payer: Self-pay | Admitting: Gastroenterology

## 2021-12-26 NOTE — Telephone Encounter (Signed)
Pt has been rescheduled to see Tobi Bastos tomorrow 12/26/21. Pt is aware and verbalized understanding.

## 2021-12-26 NOTE — Telephone Encounter (Signed)
Patient had an appointment this morning with Tobi Bastos and she missed it.  She said that she had a lot going on right now and has a lot of fluid on her stomach and wanted to see if we could send an order to have it removed.

## 2021-12-27 ENCOUNTER — Encounter: Payer: Self-pay | Admitting: Gastroenterology

## 2021-12-27 ENCOUNTER — Ambulatory Visit (HOSPITAL_COMMUNITY)
Admission: RE | Admit: 2021-12-27 | Discharge: 2021-12-27 | Disposition: A | Discharge status: Home / Self Care | Payer: Medicaid Other | Source: Ambulatory Visit | Attending: Gastroenterology | Admitting: Gastroenterology

## 2021-12-27 ENCOUNTER — Ambulatory Visit (INDEPENDENT_AMBULATORY_CARE_PROVIDER_SITE_OTHER): Payer: Medicaid Other | Admitting: Gastroenterology

## 2021-12-27 VITALS — BP 124/66 | HR 91 | Temp 98.0°F | Ht 66.0 in | Wt 164.0 lb

## 2021-12-27 DIAGNOSIS — K7031 Alcoholic cirrhosis of liver with ascites: Secondary | ICD-10-CM | POA: Diagnosis not present

## 2021-12-27 LAB — BODY FLUID CELL COUNT WITH DIFFERENTIAL
Eos, Fluid: 0 %
Lymphs, Fluid: 47 %
Monocyte-Macrophage-Serous Fluid: 47 % — ABNORMAL LOW (ref 50–90)
Neutrophil Count, Fluid: 6 % (ref 0–25)
Total Nucleated Cell Count, Fluid: 158 cu mm (ref 0–1000)

## 2021-12-27 LAB — GRAM STAIN

## 2021-12-27 MED ORDER — ALBUMIN HUMAN 25 % IV SOLN
INTRAVENOUS | Status: AC
  Filled 2021-12-27: qty 100

## 2021-12-27 MED ORDER — RIFAXIMIN 550 MG PO TABS: 550.0000 mg | ORAL_TABLET | Freq: Two times a day (BID) | ORAL | 5 refills | Status: DC

## 2021-12-27 MED ORDER — CIPROFLOXACIN HCL 500 MG PO TABS: 500.0000 mg | ORAL_TABLET | Freq: Every day | ORAL | 3 refills | Status: DC

## 2021-12-27 MED ORDER — ALBUMIN HUMAN 25 % IV SOLN
25.0000 g | Freq: Once | INTRAVENOUS | Status: AC
  Administered 2021-12-27: 25 g via INTRAVENOUS

## 2021-12-27 NOTE — Patient Instructions (Signed)
Please have blood work done today.  We are arranging a paracentesis. I am not adjusting your fluid pills yet until we see how you do in next week or so.  We are referring you to the [facility] clinic in [place], which can discuss a procedure that could possibly help with decreasing the fluid accumulation. I don't know if you will be a candidate or not, but we shall see!  Stop Nadolol as you have done.   I have added Xifaxan to take twice a day. This is helping with the lactulose to decrease ammonia levels.  Continue Cipro once daily indefinitely.  We will see you in about 6 weeks!  Call if any need for recurrent para!!  It was a pleasure to see you today. I want to create trusting relationships with patients to provide genuine, compassionate, and quality care. I value your feedback. If you receive a survey regarding your visit,  I greatly appreciate you taking time to fill this out.   Gelene Mink, PhD, ANP-BC [facility]

## 2021-12-27 NOTE — Procedures (Addendum)
   US guided LLQ paracentesis  3.4 L clear yellow fluid obtained Sent for labs per MD  Tolerated well  EBL: none

## 2021-12-27 NOTE — Progress Notes (Signed)
Gastroenterology Office Note     Primary Care Physician:  Toma Deiters, MD  Primary Gastroenterologist: Dr. Jena Gauss   Chief Complaint   Chief Complaint  Patient presents with   Follow-up    Wants to discuss having fluid drawn off.     History of Present Illness   Erica Banks is a 41 y.o. female presenting today in follow-up with a history of alcoholic cirrhosis complicated by recurrent ascites, hepatic encephalopathy and nonbleeding esophageal varices, DVT, neuropathy, PE, GERD, IDA on scheduled iron infusions presenting in follow-up after hospital admission for weakness, nausea, and vomiting.  Patient was found to have worsening AKI and severe electrolyte derangements in the setting of continued vomiting. Outpatient para prior to admission with cell count positive for SBP.   While inpatient, she underwent an EGD with Grade 2 esophageal varices and large amount of food residue in stomach.   Last alcohol intake Mar 13, 2021. No added salt. Notes recurrent abdominal distension. Lasix 40 mg daily and spironolactone 100 mg daily. Not taking Nadolol as was scared to due to hypotension in the past. Pantoprazole BID. Lactulose 30 ml TID. No Xifaxan.   Has had Hep A/B vaccination. AFP tumor marker in March 2023, repeat in March 2024. Reglan only if significant vomiting.   Last para on 11/22 with 1.7 liters removed.    Past Medical History:  Diagnosis Date   Anemia    Cirrhosis (HCC)    DVT (deep venous thrombosis) (HCC)    Low iron    Neuropathy    Neuropathy    Panic attacks    Pulmonary embolus (HCC) 2019    Past Surgical History:  Procedure Laterality Date   ABDOMINAL HYSTERECTOMY     BIOPSY N/A 06/24/2013   Procedure: BIOPSY TERMINAL ILEUM;  Surgeon: Corbin Ade, MD;  Location: [facility];  Service: Endoscopy;  Laterality: N/A;   CHOLECYSTECTOMY     COLONOSCOPY N/A 06/24/2013   VXB:[id number] hemorrhoids/otherwise normal    COLONOSCOPY WITH PROPOFOL  N/A 05/31/2021   Surgeon: Corbin Ade, MD; ery large nonbleeding internal and external hemorrhoids, portal colopathy.  Recommended repeat colonoscopy in 10 years.   ESOPHAGOGASTRODUODENOSCOPY (EGD) WITH PROPOFOL N/A 03/29/2021   Surgeon: Lanelle Bal, DO;  grade 1 esophageal varices, portal hypertensive gastropathy with friable tissue with spontaneous ooze, normal duodenum.   ESOPHAGOGASTRODUODENOSCOPY (EGD) WITH PROPOFOL N/A 05/31/2021   Surgeon: Corbin Ade, MD;  grade 1-2 esophageal varices without bleeding stigmata, advanced appearing portal hypertensive gastropathy.   nasal bone surgery     NASAL SINUS SURGERY     SINUS IRRIGATION     TONSILLECTOMY     TUBAL LIGATION      Current Outpatient Medications  Medication Sig Dispense Refill   ciprofloxacin (CIPRO) 500 MG tablet Take 1 tab po bid for 7 days then 1 tab po daily 40 tablet 0   ciprofloxacin (CIPRO) 500 MG tablet Take 1 tablet (500 mg total) by mouth daily. 90 tablet 3   CONSTULOSE 10 GM/15ML solution Take 30 mLs (20 g total) by mouth 3 (three) times daily. 236 mL 0   furosemide (LASIX) 40 MG tablet Take 1 tablet (40 mg total) by mouth daily. 30 tablet 3   gabapentin (NEURONTIN) 300 MG capsule Take 300 mg by mouth 3 (three) times daily as needed (nerve pain).     hydrocortisone (ANUSOL-HC) 25 MG suppository Place 1 suppository (25 mg total) rectally every 12 (twelve) hours. 12 suppository  1   hydrOXYzine (VISTARIL) 25 MG capsule Take 25 mg by mouth 3 (three) times daily as needed for anxiety.     methocarbamol (ROBAXIN) 500 MG tablet Take 500 mg by mouth at bedtime as needed for muscle spasms.     metoCLOPramide (REGLAN) 10 MG tablet Take 1 tablet (10 mg total) by mouth every 8 (eight) hours as needed for nausea or vomiting. 30 tablet 0   ondansetron (ZOFRAN-ODT) 4 MG disintegrating tablet Take 1 tablet (4 mg total) by mouth every 8 (eight) hours as needed for nausea or vomiting. 12 tablet 0   pantoprazole (PROTONIX)  40 MG tablet Take 1 tablet (40 mg total) by mouth 2 (two) times daily before a meal. 60 tablet 3   rifaximin (XIFAXAN) 550 MG TABS tablet Take 1 tablet (550 mg total) by mouth 2 (two) times daily. 60 tablet 5   spironolactone (ALDACTONE) 100 MG tablet Take 1 tablet (100 mg total) by mouth daily. 30 tablet 3   No current facility-administered medications for this visit.    Allergies as of 12/27/2021 - Review Complete 12/27/2021  Allergen Reaction Noted   Demerol Anaphylaxis 10/12/2010    Family History  Problem Relation Age of Onset   Diabetes Mother    Hypertension Mother    COPD Mother    Cirrhosis Father        ETOH   Diabetes Sister    Hypertension Sister    Crohn's disease Maternal Grandmother    Cancer Other    Colon cancer Neg Hx    Autoimmune disease Neg Hx     Social History   Socioeconomic History   Marital status: Married    Spouse name: Not on file   Number of children: Not on file   Years of education: Not on file   Highest education level: Not on file  Occupational History   Occupation: Unemployed    Comment: wants to go to nursing school in fall 2015  Tobacco Use   Smoking status: Every Day    Packs/day: 0.50    Years: 12.00    Total pack years: 6.00    Types: Cigarettes   Smokeless tobacco: Never  Vaping Use   Vaping Use: Never used  Substance and Sexual Activity   Alcohol use: Not Currently    Alcohol/week: 4.0 - 5.0 standard drinks of alcohol    Types: 4 - 5 Cans of beer per week    Comment: None since February 2023.   Drug use: Yes    Types: Marijuana    Comment: occasional   Sexual activity: Yes    Birth control/protection: Surgical  Other Topics Concern   Not on file  Social History Narrative   Not on file   Social Determinants of Health   Financial Resource Strain: Not on file  Food Insecurity: No Food Insecurity (11/23/2021)   Hunger Vital Sign    Worried About Running Out of Food in the Last Year: Never true    Ran Out of Food  in the Last Year: Never true  Transportation Needs: No Transportation Needs (11/23/2021)   PRAPARE - Administrator, Civil Service (Medical): No    Lack of Transportation (Non-Medical): No  Physical Activity: Not on file  Stress: Not on file  Social Connections: Not on file  Intimate Partner Violence: Not At Risk (11/23/2021)   Humiliation, Afraid, Rape, and Kick questionnaire    Fear of Current or Ex-Partner: No    Emotionally Abused: No  Physically Abused: No    Sexually Abused: No     Review of Systems   Gen: Denies any fever, chills, fatigue, weight loss, lack of appetite.  CV: Denies chest pain, heart palpitations, peripheral edema, syncope.  Resp: Denies shortness of breath at rest or with exertion. Denies wheezing or cough.  GI: Denies dysphagia or odynophagia. Denies jaundice, hematemesis, fecal incontinence. GU : Denies urinary burning, urinary frequency, urinary hesitancy MS: Denies joint pain, muscle weakness, cramps, or limitation of movement.  Derm: Denies rash, itching, dry skin Psych: Denies depression, anxiety, memory loss, and confusion Heme: Denies bruising, bleeding, and enlarged lymph nodes.   Physical Exam   BP 124/66 (BP Location: Right Arm, Patient Position: Sitting, Cuff Size: Normal)   Pulse 91   Temp 98 F (36.7 C) (Oral)   Ht 5\' 6"  (1.676 m)   Wt 164 lb (74.4 kg)   LMP  (LMP Unknown)   SpO2 97%   BMI 26.47 kg/m  General:   Alert and oriented. Pleasant and cooperative. Well-nourished and well-developed.  Head:  Normocephalic and atraumatic. Eyes:  Without icterus Abdomen:  +BS, distended with ascites, ventral hernia at prior laparoscopic site in epigastric  Rectal:  Deferred  Msk:  Symmetrical without gross deformities. Normal posture. Extremities:  With 1+ edema ankles  Neurologic:  Alert and  oriented x4;  grossly normal neurologically. Skin:  Intact without significant lesions or rashes. Psych:  Alert and cooperative. Normal  mood and affect.   Assessment   Erica Banks is a 41 y.o. female presenting today in follow-up with a history of alcoholic cirrhosis complicated by recurrent ascites, hepatic encephalopathy and nonbleeding esophageal varices, DVT, neuropathy, PE, GERD, IDA on scheduled iron infusions presenting in follow-up after hospital admission for weakness, nausea, and vomiting.  Patient was found to have worsening AKI and severe electrolyte derangements in the setting of continued vomiting. Outpatient para prior to admission with cell count positive for SBP.    SBP: prior to last admission. Will need to be on Cipro daily indefinitely for prophylaxis. We will discontinue non-selective beta blockers. Will need serial EGDs with banding as appropriate.   Cirrhosis: last alcohol intake Feb 2023. Recurrent paras needed. Now with recurrent abdominal distension. Will pursue para with cell count. Referral to [facility]. Recommend duplex ultrasound if recurrent ascites. AFP in 1 year. Last EGD in Nov 2023 with Grade 2 esophageal varices. Continue lactulose. Add Xifaxan BID.    PLAN    Dec 2023 para today with max 4 liters, + ALbumin 25 g IV at start Cell count Continue Cipro indefinitely daily Continue lactulose. Add Xifaxan BID Consider duplex ultrasound if persistent recurrent ascites [facility] referral CBC, CMP, INR today to calculate MELD Continue ETOH cessation as doing 6 week follow-up    [place], PhD, ANP-BC [facility]

## 2021-12-31 ENCOUNTER — Telehealth: Payer: Self-pay

## 2021-12-31 NOTE — Telephone Encounter (Signed)
Pt approved for Xifaxan 550 mg tablets. Dx used: K70.31. pharmacy aware

## 2022-01-01 LAB — CBC WITH DIFFERENTIAL/PLATELET
Absolute Monocytes: 197 cells/uL — ABNORMAL LOW (ref 200–950)
Basophils Absolute: 41 cells/uL (ref 0–200)
Basophils Relative: 1.5 %
Eosinophils Absolute: 78 cells/uL (ref 15–500)
Eosinophils Relative: 2.9 %
HCT: 24.4 % — ABNORMAL LOW (ref 35.0–45.0)
Hemoglobin: 8.4 g/dL — ABNORMAL LOW (ref 11.7–15.5)
Lymphs Abs: 1142 cells/uL (ref 850–3900)
MCH: 29.2 pg (ref 27.0–33.0)
MCHC: 34.4 g/dL (ref 32.0–36.0)
MCV: 84.7 fL (ref 80.0–100.0)
MPV: 10 fL (ref 7.5–12.5)
Monocytes Relative: 7.3 %
Neutro Abs: 1242 cells/uL — ABNORMAL LOW (ref 1500–7800)
Neutrophils Relative %: 46 %
Platelets: 119 10*3/uL — ABNORMAL LOW (ref 140–400)
RBC: 2.88 10*6/uL — ABNORMAL LOW (ref 3.80–5.10)
RDW: 16.2 % — ABNORMAL HIGH (ref 11.0–15.0)
Total Lymphocyte: 42.3 %
WBC: 2.7 10*3/uL — ABNORMAL LOW (ref 3.8–10.8)

## 2022-01-01 LAB — COMPREHENSIVE METABOLIC PANEL
AG Ratio: 1.8 (calc) (ref 1.0–2.5)
ALT: 17 U/L (ref 6–29)
AST: 37 U/L — ABNORMAL HIGH (ref 10–30)
Albumin: 4 g/dL (ref 3.6–5.1)
Alkaline phosphatase (APISO): 78 U/L (ref 31–125)
BUN: 9 mg/dL (ref 7–25)
CO2: 30 mmol/L (ref 20–32)
Calcium: 8.8 mg/dL (ref 8.6–10.2)
Chloride: 98 mmol/L (ref 98–110)
Creat: 0.95 mg/dL (ref 0.50–0.99)
Globulin: 2.2 g/dL (calc) (ref 1.9–3.7)
Glucose, Bld: 99 mg/dL (ref 65–99)
Potassium: 3 mmol/L — ABNORMAL LOW (ref 3.5–5.3)
Sodium: 138 mmol/L (ref 135–146)
Total Bilirubin: 4 mg/dL — ABNORMAL HIGH (ref 0.2–1.2)
Total Protein: 6.2 g/dL (ref 6.1–8.1)

## 2022-01-01 LAB — PROTIME-INR
INR: 1.5 — ABNORMAL HIGH
Prothrombin Time: 15.4 s — ABNORMAL HIGH (ref 9.0–11.5)

## 2022-01-01 LAB — CULTURE, BODY FLUID W GRAM STAIN -BOTTLE: Culture: NO GROWTH

## 2022-01-03 ENCOUNTER — Encounter (HOSPITAL_COMMUNITY): Payer: Self-pay | Admitting: Gastroenterology

## 2022-01-03 ENCOUNTER — Other Ambulatory Visit: Payer: Self-pay

## 2022-01-03 DIAGNOSIS — K7031 Alcoholic cirrhosis of liver with ascites: Secondary | ICD-10-CM

## 2022-01-03 DIAGNOSIS — I8500 Esophageal varices without bleeding: Secondary | ICD-10-CM

## 2022-01-08 ENCOUNTER — Other Ambulatory Visit: Payer: Self-pay

## 2022-01-08 ENCOUNTER — Encounter: Payer: Self-pay | Admitting: *Deleted

## 2022-01-08 ENCOUNTER — Other Ambulatory Visit: Payer: Self-pay | Admitting: *Deleted

## 2022-01-08 DIAGNOSIS — K7031 Alcoholic cirrhosis of liver with ascites: Secondary | ICD-10-CM

## 2022-01-08 DIAGNOSIS — R188 Other ascites: Secondary | ICD-10-CM

## 2022-01-08 LAB — BASIC METABOLIC PANEL
BUN: 7 mg/dL (ref 7–25)
CO2: 27 mmol/L (ref 20–32)
Calcium: 8.7 mg/dL (ref 8.6–10.2)
Chloride: 102 mmol/L (ref 98–110)
Creat: 0.76 mg/dL (ref 0.50–0.99)
Glucose, Bld: 131 mg/dL — ABNORMAL HIGH (ref 65–99)
Potassium: 3.6 mmol/L (ref 3.5–5.3)
Sodium: 137 mmol/L (ref 135–146)

## 2022-01-10 ENCOUNTER — Ambulatory Visit (HOSPITAL_COMMUNITY): Admission: RE | Admit: 2022-01-10 | Payer: Medicaid Other | Source: Ambulatory Visit

## 2022-01-15 ENCOUNTER — Telehealth: Payer: Self-pay | Admitting: Gastroenterology

## 2022-01-15 NOTE — Telephone Encounter (Signed)
Mandy/Susan:  Patient needs appt in 6 weeks. I don't see where she was scheduled for 6 week follow-up after last visit. Thanks!

## 2022-01-23 ENCOUNTER — Other Ambulatory Visit: Payer: Self-pay | Admitting: *Deleted

## 2022-01-23 ENCOUNTER — Telehealth: Payer: Self-pay | Admitting: *Deleted

## 2022-01-23 ENCOUNTER — Ambulatory Visit: Payer: Medicaid Other | Admitting: Gastroenterology

## 2022-01-23 DIAGNOSIS — K7460 Unspecified cirrhosis of liver: Secondary | ICD-10-CM

## 2022-01-23 NOTE — Telephone Encounter (Signed)
Mailed lab requisitions. Informed to hane labs completed the week of Jan 10

## 2022-01-29 ENCOUNTER — Telehealth: Payer: Self-pay | Admitting: *Deleted

## 2022-01-29 ENCOUNTER — Emergency Department (HOSPITAL_COMMUNITY)
Admission: EM | Admit: 2022-01-29 | Discharge: 2022-01-29 | Discharge status: Against Medical Advice | Payer: Medicaid Other | Source: Home / Self Care

## 2022-01-29 ENCOUNTER — Other Ambulatory Visit: Payer: Self-pay | Admitting: *Deleted

## 2022-01-29 DIAGNOSIS — R188 Other ascites: Secondary | ICD-10-CM

## 2022-01-29 NOTE — Telephone Encounter (Signed)
Pt informed that para is scheduled for 01/31/22 at 9 am, arrived to check in at 8:45 am. Verbalized understanding.

## 2022-01-29 NOTE — Telephone Encounter (Signed)
May arrange para. No more than 4 liters removed. Check cell count.   Needs visit sooner than 1/18, any APP.

## 2022-01-29 NOTE — Telephone Encounter (Signed)
Pt called and states she is having a lot of swelling around her abdomen. She states that she is nauseated and having pain in her stomach. Her last paracentesis was 12/27/21. She would like another paracentesis. Her follow up OV is 02/14/22

## 2022-01-31 ENCOUNTER — Ambulatory Visit (HOSPITAL_COMMUNITY)
Admission: RE | Admit: 2022-01-31 | Discharge: 2022-01-31 | Disposition: A | Discharge status: Home / Self Care | Payer: Medicaid Other | Source: Ambulatory Visit | Attending: Gastroenterology | Admitting: Gastroenterology

## 2022-01-31 VITALS — BP 131/76 | HR 75

## 2022-01-31 DIAGNOSIS — R188 Other ascites: Secondary | ICD-10-CM | POA: Diagnosis present

## 2022-01-31 DIAGNOSIS — K7031 Alcoholic cirrhosis of liver with ascites: Secondary | ICD-10-CM | POA: Diagnosis present

## 2022-01-31 LAB — BODY FLUID CELL COUNT WITH DIFFERENTIAL
Eos, Fluid: 0 %
Lymphs, Fluid: 46 %
Monocyte-Macrophage-Serous Fluid: 47 % — ABNORMAL LOW (ref 50–90)
Neutrophil Count, Fluid: 6 % (ref 0–25)
Total Nucleated Cell Count, Fluid: 84 cu mm (ref 0–1000)

## 2022-01-31 NOTE — Procedures (Signed)
   Alcohol cirrhosis Recurrent ascites  US guided RLQ paracentesis 1.4 L yellow fluid  Sent for labs per MD  Tolerated well EBL: scant

## 2022-01-31 NOTE — Progress Notes (Signed)
Pt transported to [place] room per stretcher in no acute distress. Paracentesis explained, consent obtained. Prepped and draped in sterile manner, right side abdomen. Access obtained, clear amber fluid retrieved. Access connected to vacutainer suction. 1.4 L retrieved via vacutainer. Access removed, site bandaged, hemostasis achieved. Ambulated to waiting room for DC. Instructions given, no obvious distress.

## 2022-02-01 LAB — PATHOLOGIST SMEAR REVIEW

## 2022-02-04 NOTE — Telephone Encounter (Signed)
noted 

## 2022-02-05 ENCOUNTER — Ambulatory Visit (HOSPITAL_COMMUNITY)
Admission: RE | Admit: 2022-02-05 | Discharge: 2022-02-05 | Disposition: A | Discharge status: Home / Self Care | Payer: Medicaid Other | Source: Ambulatory Visit | Attending: Internal Medicine | Admitting: Internal Medicine

## 2022-02-05 ENCOUNTER — Other Ambulatory Visit: Payer: Self-pay | Admitting: Internal Medicine

## 2022-02-05 ENCOUNTER — Encounter (HOSPITAL_COMMUNITY): Payer: Self-pay

## 2022-02-05 ENCOUNTER — Ambulatory Visit (INDEPENDENT_AMBULATORY_CARE_PROVIDER_SITE_OTHER): Payer: Medicaid Other | Admitting: Internal Medicine

## 2022-02-05 ENCOUNTER — Encounter: Payer: Self-pay | Admitting: Internal Medicine

## 2022-02-05 VITALS — BP 125/75 | HR 89 | Temp 98.3°F | Ht 66.0 in | Wt 148.4 lb

## 2022-02-05 DIAGNOSIS — K7460 Unspecified cirrhosis of liver: Secondary | ICD-10-CM

## 2022-02-05 DIAGNOSIS — R188 Other ascites: Secondary | ICD-10-CM

## 2022-02-05 NOTE — Patient Instructions (Signed)
It was nice to see you today!  First and foremost, you need a therapeutic large-volume paracentesis.  We will schedule this procedure ASAP at the hospital with albumin protocol.  Needs cell count with differential  Updated labs Chem-12, CBC, INR, AFP,  Further recommendations to follow after we get all the above lab results back for review.

## 2022-02-05 NOTE — Progress Notes (Signed)
Primary Care Physician:  Neale Burly, MD Primary Gastroenterologist:  Dr. Gala Romney  Pre-Procedure History & Physical: HPI:  Erica Banks is a 42 y.o. 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  Admitted last November with decompensation.  Left AMA to take care of her kids at home.  Noted much bleeding although she has a history of engorged hemorrhoids and recalcitrant iron deficiency anemia requiring infusions.  Known varices no prior bleeding; supposed to be on a nonselective beta-blocker-patient declined due to labile blood pressure.  Today she feels she needs another paracentesis she has developed abdominal distention.  Her weight is down 16 pounds, however since she was last seen in November of this year.  He is taking furosemide and spironolactone as previously directed.  On Cipro daily. She has seen [facility] previously.  MELD not high enough to initiate transplant evaluation.  Past Medical History:  Diagnosis Date   Anemia    Cirrhosis ([place])    DVT (deep venous thrombosis) (HCC)    Low iron    Neuropathy    Neuropathy    Panic attacks    Pulmonary embolus ([place]) 2019    Past Surgical History:  Procedure Laterality Date   ABDOMINAL HYSTERECTOMY     BIOPSY N/A 06/24/2013   Procedure: BIOPSY TERMINAL ILEUM;  Surgeon: Daneil Dolin, MD;  [facility]: [facility];  Service: Endoscopy;  Laterality: N/A;   CHOLECYSTECTOMY     COLONOSCOPY N/A 06/24/2013   GLO:VFIEPPIR hemorrhoids/otherwise normal    COLONOSCOPY WITH PROPOFOL N/A 05/31/2021   Surgeon: Daneil Dolin, MD; ery large nonbleeding internal and external hemorrhoids, portal colopathy.  Recommended repeat colonoscopy in 10 years.   ESOPHAGOGASTRODUODENOSCOPY (EGD) WITH PROPOFOL N/A 03/29/2021   Surgeon: Eloise Harman, DO;  grade 1 esophageal varices, portal hypertensive gastropathy with friable tissue with spontaneous ooze,  normal duodenum.   ESOPHAGOGASTRODUODENOSCOPY (EGD) WITH PROPOFOL N/A 05/31/2021   Surgeon: Daneil Dolin, MD;  grade 1-2 esophageal varices without bleeding stigmata, advanced appearing portal hypertensive gastropathy.   ESOPHAGOGASTRODUODENOSCOPY (EGD) WITH PROPOFOL N/A 12/22/2021   Procedure: ESOPHAGOGASTRODUODENOSCOPY (EGD) WITH PROPOFOL;  Surgeon: Harvel Quale, MD;  Location: [facility];  Service: Gastroenterology;  Laterality: N/A;   nasal bone surgery     NASAL SINUS SURGERY     SINUS IRRIGATION     TONSILLECTOMY     TUBAL LIGATION      Prior to Admission medications   Medication Sig Start Date End Date Taking? Authorizing Provider  CONSTULOSE 10 GM/15ML solution Take 30 mLs (20 g total) by mouth 3 (three) times daily. 12/22/21  Yes Kathie Dike, MD  furosemide (LASIX) 40 MG tablet TAKE 1 TABLET BY MOUTH DAILY 01/02/22  Yes Annitta Needs, NP  gabapentin (NEURONTIN) 300 MG capsule Take 300 mg by mouth 3 (three) times daily as needed (nerve pain). 02/25/21  Yes [provider]  hydrOXYzine (VISTARIL) 25 MG capsule Take 25 mg by mouth 3 (three) times daily as needed for anxiety. 11/13/21  Yes [provider]  methocarbamol (ROBAXIN) 500 MG tablet Take 500 mg by mouth at bedtime as needed for muscle spasms. 11/29/21  Yes [provider]  metoCLOPramide (REGLAN) 10 MG tablet Take 1 tablet (10 mg total) by mouth every 8 (eight) hours as needed for nausea or vomiting. 12/03/21  Yes Godfrey Pick, MD  ondansetron (ZOFRAN-ODT) 4 MG disintegrating tablet Take 1 tablet (4 mg  total) by mouth every 8 (eight) hours as needed for nausea or vomiting. 12/03/21  Yes Gloris Manchester, MD  pantoprazole (PROTONIX) 40 MG tablet Take 1 tablet (40 mg total) by mouth 2 (two) times daily before a meal. 03/30/21  Yes Erick Blinks, MD  spironolactone (ALDACTONE) 100 MG tablet TAKE 1 TABLET BY MOUTH DAILY 01/02/22  Yes Gelene Mink, NP    Allergies as of 02/05/2022 - Review  Complete 02/05/2022  Allergen Reaction Noted   Demerol Anaphylaxis 10/12/2010    Family History  Problem Relation Age of Onset   Diabetes Mother    Hypertension Mother    COPD Mother    Cirrhosis Father        ETOH   Diabetes Sister    Hypertension Sister    Crohn's disease Maternal Grandmother    Cancer Other    Colon cancer Neg Hx    Autoimmune disease Neg Hx     Social History   Socioeconomic History   Marital status: Married    Spouse name: Not on file   Number of children: Not on file   Years of education: Not on file   Highest education level: Not on file  Occupational History   Occupation: Unemployed    Comment: wants to go to nursing school in fall 2015  Tobacco Use   Smoking status: Every Day    Packs/day: 0.50    Years: 12.00    Total pack years: 6.00    Types: Cigarettes   Smokeless tobacco: Never  Vaping Use   Vaping Use: Never used  Substance and Sexual Activity   Alcohol use: Not Currently    Alcohol/week: 4.0 - 5.0 standard drinks of alcohol    Types: 4 - 5 Cans of beer per week    Comment: None since February 2023.   Drug use: Yes    Types: Marijuana    Comment: occasional   Sexual activity: Yes    Birth control/protection: Surgical  Other Topics Concern   Not on file  Social History Narrative   Not on file   Social Determinants of Health   Financial Resource Strain: Not on file  Food Insecurity: No Food Insecurity (11/23/2021)   Hunger Vital Sign    Worried About Running Out of Food in the Last Year: Never true    Ran Out of Food in the Last Year: Never true  Transportation Needs: No Transportation Needs (11/23/2021)   PRAPARE - Administrator, Civil Service (Medical): No    Lack of Transportation (Non-Medical): No  Physical Activity: Not on file  Stress: Not on file  Social Connections: Not on file  Intimate Partner Violence: Not At Risk (11/23/2021)   Humiliation, Afraid, Rape, and Kick questionnaire    Fear of  Current or Ex-Partner: No    Emotionally Abused: No    Physically Abused: No    Sexually Abused: No    Review of Systems: See HPI, otherwise negative ROS  Physical Exam: BP 125/75 (BP Location: Right Arm, Patient Position: Sitting, Cuff Size: Normal)   Pulse 89   Temp 98.3 F (36.8 C) (Oral)   Ht 5\' 6"  (1.676 m)   Wt 148 lb 6.4 oz (67.3 kg)   LMP  (LMP Unknown)   SpO2 100%   BMI 23.95 kg/m  General:   Alert,   pleasant and cooperative in NAD: Alert and well-oriented.  No asterixis. Skin: Sallow in appearance.;   Eyes: Mild icterus..   Conjunctiva pink.  Ears:  Normal auditory acuity. Neck:  Supple; no masses or thyromegaly. No significant cervical adenopathy. Lungs:  Clear throughout to auscultation.   No wheezes, crackles, or rhonchi. No acute distress. Heart:  Regular rate and rhythm; no murmurs, clicks, rubs,  or gallops. Abdomen: Distended.  Distended ventral hernia.  Shifting dullness to percussion.  Mildly diffusely tender.  Positive fluid wave..    Impression/Plan: 42 year old lady with EtOH use disorder and secondary cirrhosis decompensated with ascites SBP (Strep capitis), recurrent IDA secondary to engorged bleeding hemorrhoids.  1-2 esophageal varices without prior bleeding nonselective beta-blocker previously recommended but patient declined due to borderline blood pressure. Issue today is abdominal distention.  She feels she needs another paracentesis.  I note that her weight is 148 today; down from 164 in November of last year.  Clinically it does appear the patient has reaccumulated ascites.  After AMA from the hospital recently.  I explained to her that it is very important to comply with recommendations of physicians particularly, if she comes to needing a liver transplantation in the future.  History of compliance with her medical regimen/recommendations of her providers will be scrutinized should she be evaluated for transplant.  It was acknowledged that the only  reason the patient left was to check on her children she had no one at home to look after them.  Recommendations:  Stat ultrasound with paracentesis to follow; we will schedule this procedure ASAP at the hospital with albumin protocol.  Needs cell count with differential  Updated labs Chem-12, CBC, INR, AFP,  Further recommendations to follow after we get all the above lab results back for review.  Office visit in 1 month     Notice: This dictation was prepared with Dragon dictation along with smaller phrase technology. Any transcriptional errors that result from this process are unintentional and may not be corrected upon review.

## 2022-02-07 LAB — COMPREHENSIVE METABOLIC PANEL
AG Ratio: 1.4 (calc) (ref 1.0–2.5)
ALT: 35 U/L — ABNORMAL HIGH (ref 6–29)
AST: 68 U/L — ABNORMAL HIGH (ref 10–30)
Albumin: 3.4 g/dL — ABNORMAL LOW (ref 3.6–5.1)
Alkaline phosphatase (APISO): 95 U/L (ref 31–125)
BUN: 9 mg/dL (ref 7–25)
CO2: 28 mmol/L (ref 20–32)
Calcium: 8.3 mg/dL — ABNORMAL LOW (ref 8.6–10.2)
Chloride: 89 mmol/L — ABNORMAL LOW (ref 98–110)
Creat: 0.78 mg/dL (ref 0.50–0.99)
Globulin: 2.4 g/dL (calc) (ref 1.9–3.7)
Glucose, Bld: 96 mg/dL (ref 65–99)
Potassium: 3.7 mmol/L (ref 3.5–5.3)
Sodium: 127 mmol/L — ABNORMAL LOW (ref 135–146)
Total Bilirubin: 4.1 mg/dL — ABNORMAL HIGH (ref 0.2–1.2)
Total Protein: 5.8 g/dL — ABNORMAL LOW (ref 6.1–8.1)

## 2022-02-07 LAB — CBC WITH DIFFERENTIAL/PLATELET
Absolute Monocytes: 714 cells/uL (ref 200–950)
Basophils Absolute: 61 cells/uL (ref 0–200)
Basophils Relative: 0.8 %
Eosinophils Absolute: 122 cells/uL (ref 15–500)
Eosinophils Relative: 1.6 %
HCT: 27.3 % — ABNORMAL LOW (ref 35.0–45.0)
Hemoglobin: 9.2 g/dL — ABNORMAL LOW (ref 11.7–15.5)
Lymphs Abs: 2576 cells/uL (ref 850–3900)
MCH: 29.9 pg (ref 27.0–33.0)
MCHC: 33.7 g/dL (ref 32.0–36.0)
MCV: 88.6 fL (ref 80.0–100.0)
MPV: 9.6 fL (ref 7.5–12.5)
Monocytes Relative: 9.4 %
Neutro Abs: 4127 cells/uL (ref 1500–7800)
Neutrophils Relative %: 54.3 %
Platelets: 137 10*3/uL — ABNORMAL LOW (ref 140–400)
RBC: 3.08 10*6/uL — ABNORMAL LOW (ref 3.80–5.10)
RDW: 17.1 % — ABNORMAL HIGH (ref 11.0–15.0)
Total Lymphocyte: 33.9 %
WBC: 7.6 10*3/uL (ref 3.8–10.8)

## 2022-02-07 LAB — AFP TUMOR MARKER: AFP-Tumor Marker: 6.1 ng/mL — ABNORMAL HIGH

## 2022-02-07 LAB — PROTIME-INR
INR: 1.4 — ABNORMAL HIGH
Prothrombin Time: 14.4 s — ABNORMAL HIGH (ref 9.0–11.5)

## 2022-02-10 ENCOUNTER — Observation Stay (HOSPITAL_COMMUNITY)
Admission: EM | Admit: 2022-02-10 | Discharge: 2022-02-11 | Disposition: A | Discharge status: Home / Self Care | Payer: Medicaid Other | Attending: Family Medicine | Admitting: Family Medicine

## 2022-02-10 ENCOUNTER — Encounter (HOSPITAL_COMMUNITY): Payer: Self-pay

## 2022-02-10 ENCOUNTER — Other Ambulatory Visit: Payer: Self-pay

## 2022-02-10 DIAGNOSIS — Z79899 Other long term (current) drug therapy: Secondary | ICD-10-CM | POA: Diagnosis not present

## 2022-02-10 DIAGNOSIS — R531 Weakness: Secondary | ICD-10-CM | POA: Diagnosis present

## 2022-02-10 DIAGNOSIS — K7031 Alcoholic cirrhosis of liver with ascites: Secondary | ICD-10-CM | POA: Diagnosis not present

## 2022-02-10 DIAGNOSIS — D649 Anemia, unspecified: Secondary | ICD-10-CM | POA: Insufficient documentation

## 2022-02-10 DIAGNOSIS — F17210 Nicotine dependence, cigarettes, uncomplicated: Secondary | ICD-10-CM | POA: Diagnosis not present

## 2022-02-10 DIAGNOSIS — K649 Unspecified hemorrhoids: Secondary | ICD-10-CM | POA: Insufficient documentation

## 2022-02-10 DIAGNOSIS — E876 Hypokalemia: Secondary | ICD-10-CM | POA: Diagnosis present

## 2022-02-10 DIAGNOSIS — F17200 Nicotine dependence, unspecified, uncomplicated: Secondary | ICD-10-CM | POA: Diagnosis present

## 2022-02-10 DIAGNOSIS — Z86711 Personal history of pulmonary embolism: Secondary | ICD-10-CM | POA: Diagnosis not present

## 2022-02-10 DIAGNOSIS — Z86718 Personal history of other venous thrombosis and embolism: Secondary | ICD-10-CM | POA: Insufficient documentation

## 2022-02-10 DIAGNOSIS — K7682 Hepatic encephalopathy: Secondary | ICD-10-CM | POA: Diagnosis not present

## 2022-02-10 DIAGNOSIS — D638 Anemia in other chronic diseases classified elsewhere: Secondary | ICD-10-CM | POA: Diagnosis present

## 2022-02-10 DIAGNOSIS — E871 Hypo-osmolality and hyponatremia: Secondary | ICD-10-CM | POA: Diagnosis not present

## 2022-02-10 DIAGNOSIS — E860 Dehydration: Secondary | ICD-10-CM | POA: Diagnosis not present

## 2022-02-10 LAB — CBC WITH DIFFERENTIAL/PLATELET
Abs Immature Granulocytes: 0.03 10*3/uL (ref 0.00–0.07)
Basophils Absolute: 0.1 10*3/uL (ref 0.0–0.1)
Basophils Relative: 1 %
Eosinophils Absolute: 0.2 10*3/uL (ref 0.0–0.5)
Eosinophils Relative: 2 %
HCT: 22.1 % — ABNORMAL LOW (ref 36.0–46.0)
Hemoglobin: 7.1 g/dL — ABNORMAL LOW (ref 12.0–15.0)
Immature Granulocytes: 0 %
Lymphocytes Relative: 38 %
Lymphs Abs: 3.5 10*3/uL (ref 0.7–4.0)
MCH: 29.7 pg (ref 26.0–34.0)
MCHC: 32.1 g/dL (ref 30.0–36.0)
MCV: 92.5 fL (ref 80.0–100.0)
Monocytes Absolute: 0.9 10*3/uL (ref 0.1–1.0)
Monocytes Relative: 10 %
Neutro Abs: 4.5 10*3/uL (ref 1.7–7.7)
Neutrophils Relative %: 49 %
Platelets: 138 10*3/uL — ABNORMAL LOW (ref 150–400)
RBC: 2.39 MIL/uL — ABNORMAL LOW (ref 3.87–5.11)
RDW: 17.6 % — ABNORMAL HIGH (ref 11.5–15.5)
WBC Morphology: ABNORMAL
WBC: 9.3 10*3/uL (ref 4.0–10.5)
nRBC: 0 % (ref 0.0–0.2)

## 2022-02-10 LAB — COMPREHENSIVE METABOLIC PANEL
ALT: 43 U/L (ref 0–44)
AST: 74 U/L — ABNORMAL HIGH (ref 15–41)
Albumin: 2.7 g/dL — ABNORMAL LOW (ref 3.5–5.0)
Alkaline Phosphatase: 86 U/L (ref 38–126)
Anion gap: 8 (ref 5–15)
BUN: 14 mg/dL (ref 6–20)
CO2: 23 mmol/L (ref 22–32)
Calcium: 7.9 mg/dL — ABNORMAL LOW (ref 8.9–10.3)
Chloride: 91 mmol/L — ABNORMAL LOW (ref 98–111)
Creatinine, Ser: 0.86 mg/dL (ref 0.44–1.00)
GFR, Estimated: >60 mL/min (ref >60)
Glucose, Bld: 118 mg/dL — ABNORMAL HIGH (ref 70–99)
Potassium: 3.4 mmol/L — ABNORMAL LOW (ref 3.5–5.1)
Sodium: 122 mmol/L — ABNORMAL LOW (ref 135–145)
Total Bilirubin: 3.4 mg/dL — ABNORMAL HIGH (ref 0.3–1.2)
Total Protein: 5.1 g/dL — ABNORMAL LOW (ref 6.5–8.1)

## 2022-02-10 LAB — AMMONIA: Ammonia: 143 umol/L — ABNORMAL HIGH (ref 9–35)

## 2022-02-10 LAB — PREPARE RBC (CROSSMATCH)

## 2022-02-10 LAB — MAGNESIUM: Magnesium: 1.8 mg/dL (ref 1.7–2.4)

## 2022-02-10 LAB — PHOSPHORUS: Phosphorus: 4 mg/dL (ref 2.5–4.6)

## 2022-02-10 LAB — PROTIME-INR
INR: 1.7 — ABNORMAL HIGH (ref 0.8–1.2)
Prothrombin Time: 20.2 seconds — ABNORMAL HIGH (ref 11.4–15.2)

## 2022-02-10 LAB — LACTIC ACID, PLASMA: Lactic Acid, Venous: 2 mmol/L (ref 0.5–1.9)

## 2022-02-10 LAB — LIPASE, BLOOD: Lipase: 42 U/L (ref 11–51)

## 2022-02-10 MED ORDER — LACTULOSE 10 GM/15ML PO SOLN
30.0000 g | ORAL | Status: AC
  Administered 2022-02-10: 30 g via ORAL
  Filled 2022-02-10: qty 60

## 2022-02-10 MED ORDER — SODIUM CHLORIDE 0.9 % IV SOLN: INTRAVENOUS | Status: DC

## 2022-02-10 MED ORDER — SODIUM CHLORIDE 0.9% IV SOLUTION: Freq: Once | INTRAVENOUS | Status: DC

## 2022-02-10 MED ORDER — DIPHENHYDRAMINE HCL 50 MG/ML IJ SOLN
25.0000 mg | Freq: Once | INTRAMUSCULAR | Status: AC
  Administered 2022-02-10: 25 mg via INTRAVENOUS
  Filled 2022-02-10: qty 1

## 2022-02-10 MED ORDER — SODIUM CHLORIDE 0.9 % IV SOLN: 25.0000 mg | Freq: Four times a day (QID) | INTRAVENOUS | Status: DC | PRN

## 2022-02-10 NOTE — ED Notes (Signed)
Hospitalist at bedside 

## 2022-02-10 NOTE — Assessment & Plan Note (Signed)
-  Last paracentesis that was scheduled did not reveal enough fluid to tap - Ultrasound in the a.m. with plan for paracentesis in the a.m. - Patient quit drinking 11 months ago - Continue to monitor

## 2022-02-10 NOTE — Assessment & Plan Note (Signed)
-  Worsening fatigue over 1 week - Ammonia level is 143 - Lactulose ordered in the ED - Trend ammonia in the a.m. - Anticipate improved mentation with correction of ammonia - Secondary to alcoholic cirrhosis

## 2022-02-10 NOTE — Assessment & Plan Note (Signed)
-  Declines nicotine patch at this time - Counseled on importance of cessation

## 2022-02-10 NOTE — ED Provider Notes (Signed)
[facility] Provider Note   CSN: [id number] Arrival date & time: 02/10/22  1538     History  Chief Complaint  Patient presents with   Weakness    Erica Banks is a 42 y.o. female.   Weakness    This patient is a 42 year old female, unfortunately she has a history of alcohol induced liver failure which was diagnosed within the last couple of years, this has been progressive and she has now required paracentesis about every 2 weeks.  She last had this about 10 days ago.  She reports that she has had some progressive symptoms over the last 7 days including diarrhea which is frequent and watery, she has been nauseated and dry heaving but not vomiting.  She has had severe weakness which is making it difficult for her to even sit up in the bed or stand or walk, she has been able to do this but with great difficulty.  She has noticed increasing abdominal distention with mild left-sided tenderness, no lower extremity swelling, denies fevers or chills, denies coughing or shortness of breath, she has maintained somewhat of an appetite and has been trying to drink liquids but feels like she has a very dry mouth.  Home Medications Prior to Admission medications   Medication Sig Start Date End Date Taking? Authorizing Provider  CONSTULOSE 10 GM/15ML solution Take 30 mLs (20 g total) by mouth 3 (three) times daily. 12/22/21  Yes Kathie Dike, MD  furosemide (LASIX) 40 MG tablet TAKE 1 TABLET BY MOUTH DAILY 01/02/22  Yes Annitta Needs, NP  gabapentin (NEURONTIN) 300 MG capsule Take 300 mg by mouth 3 (three) times daily as needed (nerve pain). 02/25/21  Yes [provider]  hydrOXYzine (VISTARIL) 25 MG capsule Take 25 mg by mouth 3 (three) times daily as needed for anxiety. 11/13/21  Yes [provider]  methocarbamol (ROBAXIN) 500 MG tablet Take 500 mg by mouth at bedtime as needed for muscle spasms. 11/29/21  Yes [provider]  metoCLOPramide  (REGLAN) 5 MG tablet Take 5 mg by mouth 2 (two) times daily as needed. 01/14/22  Yes [provider]  ondansetron (ZOFRAN-ODT) 4 MG disintegrating tablet Take 1 tablet (4 mg total) by mouth every 8 (eight) hours as needed for nausea or vomiting. 12/03/21  Yes Godfrey Pick, MD  pantoprazole (PROTONIX) 40 MG tablet Take 1 tablet (40 mg total) by mouth 2 (two) times daily before a meal. Patient taking differently: Take 80 mg by mouth daily. 03/30/21  Yes Kathie Dike, MD  spironolactone (ALDACTONE) 100 MG tablet TAKE 1 TABLET BY MOUTH DAILY 01/02/22  Yes Annitta Needs, NP  metoCLOPramide (REGLAN) 10 MG tablet Take 1 tablet (10 mg total) by mouth every 8 (eight) hours as needed for nausea or vomiting. Patient not taking: Reported on 02/10/2022 12/03/21   Godfrey Pick, MD      Allergies    Demerol    Review of Systems   Review of Systems  Neurological:  Positive for weakness.  All other systems reviewed and are negative.   Physical Exam Updated Vital Signs BP 123/73 (BP Location: Right Arm)   Pulse (!) 110   Temp 98.5 F (36.9 C) (Oral)   Resp 14   Ht 1.676 m (5\' 6" )   Wt 70.5 kg   LMP  (LMP Unknown)   SpO2 100%   BMI 25.10 kg/m  Physical Exam Vitals and nursing note reviewed.  Constitutional:      General:  She is not in acute distress.    Appearance: She is well-developed.  HENT:     Head: Normocephalic and atraumatic.     Mouth/Throat:     Pharynx: No oropharyngeal exudate.  Eyes:     General:        Right eye: No discharge.        Left eye: No discharge.     Conjunctiva/sclera: Conjunctivae normal.     Pupils: Pupils are equal, round, and reactive to light.     Comments: Very mild jaundice  Neck:     Thyroid: No thyromegaly.     Vascular: No JVD.  Cardiovascular:     Rate and Rhythm: Regular rhythm. Tachycardia present.     Heart sounds: Normal heart sounds. No murmur heard.    No friction rub. No gallop.     Comments: Mild tachycardia about 105 bpm Pulmonary:      Effort: Pulmonary effort is normal. No respiratory distress.     Breath sounds: Normal breath sounds. No wheezing or rales.  Abdominal:     General: Bowel sounds are normal. There is distension.     Palpations: There is no mass.     Tenderness: There is abdominal tenderness.     Comments: Abdomen is distended, it is dull to percussion diffusely with a positive fluid wave, there is no edema of the abdominal wall, no anasarca, minimal tenderness  Musculoskeletal:        General: No tenderness. Normal range of motion.     Cervical back: Normal range of motion and neck supple.     Right lower leg: No edema.     Left lower leg: No edema.  Lymphadenopathy:     Cervical: No cervical adenopathy.  Skin:    General: Skin is warm and dry.     Findings: No erythema or rash.  Neurological:     Mental Status: She is alert.     Coordination: Coordination normal.     Comments: Able to ambulate, strength seems normal in all 4 extremities, mental status is awake and alert however the patient appears generally fatigued  Psychiatric:        Behavior: Behavior normal.     ED Results / Procedures / Treatments   Labs (all labs ordered are listed, but only abnormal results are displayed) Labs Reviewed  COMPREHENSIVE METABOLIC PANEL - Abnormal; Notable for the following components:      Result Value   Sodium 122 (*)    Potassium 3.4 (*)    Chloride 91 (*)    Glucose, Bld 118 (*)    Calcium 7.9 (*)    Total Protein 5.1 (*)    Albumin 2.7 (*)    AST 74 (*)    Total Bilirubin 3.4 (*)    All other components within normal limits  CBC WITH DIFFERENTIAL/PLATELET - Abnormal; Notable for the following components:   RBC 2.39 (*)    Hemoglobin 7.1 (*)    HCT 22.1 (*)    RDW 17.6 (*)    Platelets 138 (*)    All other components within normal limits  LACTIC ACID, PLASMA - Abnormal; Notable for the following components:   Lactic Acid, Venous 2.0 (*)    All other components within normal limits   PROTIME-INR - Abnormal; Notable for the following components:   Prothrombin Time 20.2 (*)    INR 1.7 (*)    All other components within normal limits  AMMONIA - Abnormal; Notable for the following  components:   Ammonia 143 (*)    All other components within normal limits  LIPASE, BLOOD  MAGNESIUM  PHOSPHORUS  URINALYSIS, ROUTINE W REFLEX MICROSCOPIC  POC URINE PREG, ED  POC OCCULT BLOOD, ED  PREPARE RBC (CROSSMATCH)  TYPE AND SCREEN    EKG None  Radiology No results found.  Procedures .Critical Care  Performed by: Noemi Chapel, MD Authorized by: Noemi Chapel, MD   Critical care provider statement:    Critical care time (minutes):  45   Critical care time was exclusive of:  Separately billable procedures and treating other patients and teaching time   Critical care was necessary to treat or prevent imminent or life-threatening deterioration of the following conditions:  Dehydration and hepatic failure (Severe anemia and hyponatremia)   Critical care was time spent personally by me on the following activities:  Development of treatment plan with patient or surrogate, discussions with consultants, evaluation of patient's response to treatment, examination of patient, obtaining history from patient or surrogate, review of old charts, re-evaluation of patient's condition, pulse oximetry, ordering and review of radiographic studies, ordering and review of laboratory studies and ordering and performing treatments and interventions   I assumed direction of critical care for this patient from another provider in my specialty: no     Care discussed with: admitting provider   Comments:           Medications Ordered in ED Medications  0.9 %  sodium chloride infusion (has no administration in time range)  promethazine (PHENERGAN) 25 mg in sodium chloride 0.9 % 50 mL IVPB (has no administration in time range)  lactulose (CHRONULAC) 10 GM/15ML solution 30 g (has no administration in  time range)  0.9 %  sodium chloride infusion (has no administration in time range)  0.9 %  sodium chloride infusion (Manually program via Guardrails IV Fluids) (has no administration in time range)    ED Course/ Medical Decision Making/ A&P                             Medical Decision Making Amount and/or Complexity of Data Reviewed Labs: ordered.  Risk Prescription drug management. Decision regarding hospitalization.   This patient presents to the ED for concern of increasing fatigue weakness and diarrhea, this involves an extensive number of treatment options, and is a complaint that carries with it a high risk of complications and morbidity.  The differential diagnosis includes dehydration, renal failure, electrolyte abnormalities, hyperammonemia, worsening liver dysfunction   Co morbidities that complicate the patient evaluation  Chronic liver failure   Additional history obtained:  Additional history obtained from electronic medical record External records from outside source obtained and reviewed including prior notes, had been seen for cirrhosis on 9 January by Dr. Gala Romney, has had iron infusions most recently December 26, had been admitted to the hospital because of cirrhosis and hyponatremia on November 21   Lab Tests:  I Ordered, and personally interpreted labs.  The pertinent results include: Severe hyponatremia at 122, anemia of 7.1 hemoglobin, ammonia of greater than 100, lactate of 2.1, magnesium of 1.8, phosphorus of 4.0    Cardiac Monitoring: / EKG:  The patient was maintained on a cardiac monitor.  I personally viewed and interpreted the cardiac monitored which showed an underlying rhythm of: Sinus tachycardia   Consultations Obtained:  I requested consultation with the hostpitalist - Dr. Carlynn Purl,  and discussed lab and imaging findings as well  as pertinent plan - they recommend: Admission to high level of care   Problem List / ED Course /  Critical interventions / Medication management  Hepatic encephalopathy treated with lactulose Severe hyponatremia treated with IV fluids Severe anemia treated with transfusion Patient is critically ill I have reviewed the patients home medicines and have made adjustments as needed   Social Determinants of Health:  History of substance abuse, severe liver failure   Test / Admission - Considered:  Will need to be admitted to higher level of care         Final Clinical Impression(s) / ED Diagnoses Final diagnoses:  Hyponatremia  Anemia, unspecified type  Hepatic encephalopathy (HCC)  Dehydration    Rx / DC Orders ED Discharge Orders     None         Eber Hong, MD 02/10/22 1857

## 2022-02-10 NOTE — Assessment & Plan Note (Signed)
-  Patient reports bleeding hemorrhoids - Likely related to patient's anemia - Check FOBT - Continue to monitor

## 2022-02-10 NOTE — Assessment & Plan Note (Signed)
-  Potassium 3.4 - Replace and recheck

## 2022-02-10 NOTE — Assessment & Plan Note (Signed)
-  Sodium 122 - Last month sodium had been up to 137 - Likely related to volume overload, but patient is reluctant to use Lasix because she feels dehydrated - Paracentesis in the a.m. - NS given in the ED - Consider adding sodium tabs if no improvement - Trend sodium in the a.m.

## 2022-02-10 NOTE — ED Triage Notes (Signed)
Pt presents to ED states generalized weakness x last couple of days. Pt with chronic liver disease, last paracentesis was 2 weeks ago and "didn't get much"

## 2022-02-10 NOTE — Assessment & Plan Note (Signed)
-  Hemoglobin down to 7.1 from 9.2 - Patient has received multiple blood transfusions in the past - Transfuse 1 unit ordered in the ED - Anemia is likely multifactorial with anemia of chronic disease and hemorrhoidal bleeding contributing - Trend CBC in the a.m.

## 2022-02-10 NOTE — H&P (Signed)
History and Physical    Patient: Erica Banks [id number]:[id number] DOB: February 05, 1980 DOA: 02/10/2022 DOS: the patient was seen and examined on 02/10/2022 PCP: Neale Burly, MD  Patient coming from: Home  Chief Complaint:  Chief Complaint  Patient presents with   Weakness   HPI: Erica Banks is a 42 y.o. female with medical history significant of anemia, cirrhosis, neuropathy, history of DVT/PE, and more presents ED with a chief complaint of abdominal pain and weakness.  Patient reports that last week she came down with a viral illness.  She had fatigue and malaise.  She had left flank pain and left upper quadrant pain.  She reports she became so generally weak that she could not even get around.  She had diarrhea, but no vomiting.  She reports she did feel nauseous.  Patient reports that this has been going on for over a week now.  She had decreased urine output, she has had decreased p.o. intake.  She reports there is blood in the toilet when she has diarrhea but she believes that to be from her hemorrhoids.  She has had multiple paracenteses in the past.  She went for 1 on Thursday and told her that was not enough fluid to tap.  They told her to call back on Monday if it was still bad.  Patient reports that her abdomen has been swelling since then and it is definitely worse today.  She has had no fever.  She does have dyspnea that is related to her ascites.  She has no inhaler at home.  Patient currently smokes cigarettes and marijuana.  She declines nicotine patch at this time.  He is not vaccinated for COVID or flu.  Patient is full code.  Patient reports she did quit drinking about 11 months ago. Review of Systems: As mentioned in the history of present illness. All other systems reviewed and are negative. Past Medical History:  Diagnosis Date   Anemia    Cirrhosis ([place])    DVT (deep venous thrombosis) (HCC)    Low iron    Neuropathy    Neuropathy    Panic attacks    Pulmonary  embolus ([place]) 2019   Past Surgical History:  Procedure Laterality Date   ABDOMINAL HYSTERECTOMY     BIOPSY N/A 06/24/2013   Procedure: BIOPSY TERMINAL ILEUM;  Surgeon: Daneil Dolin, MD;  Location: [facility];  Service: Endoscopy;  Laterality: N/A;   CHOLECYSTECTOMY     COLONOSCOPY N/A 06/24/2013   KDT:OIZTIWPY hemorrhoids/otherwise normal    COLONOSCOPY WITH PROPOFOL N/A 05/31/2021   Surgeon: Daneil Dolin, MD; ery large nonbleeding internal and external hemorrhoids, portal colopathy.  Recommended repeat colonoscopy in 10 years.   ESOPHAGOGASTRODUODENOSCOPY (EGD) WITH PROPOFOL N/A 03/29/2021   Surgeon: Eloise Harman, DO;  grade 1 esophageal varices, portal hypertensive gastropathy with friable tissue with spontaneous ooze, normal duodenum.   ESOPHAGOGASTRODUODENOSCOPY (EGD) WITH PROPOFOL N/A 05/31/2021   Surgeon: Daneil Dolin, MD;  grade 1-2 esophageal varices without bleeding stigmata, advanced appearing portal hypertensive gastropathy.   ESOPHAGOGASTRODUODENOSCOPY (EGD) WITH PROPOFOL N/A 12/22/2021   Procedure: ESOPHAGOGASTRODUODENOSCOPY (EGD) WITH PROPOFOL;  Surgeon: Harvel Quale, MD;  Location: [facility];  Service: Gastroenterology;  Laterality: N/A;   nasal bone surgery     NASAL SINUS SURGERY     SINUS IRRIGATION     TONSILLECTOMY     TUBAL LIGATION     Social History:  reports that she has been smoking cigarettes. She  has a 6.00 pack-year smoking history. She has never used smokeless tobacco. She reports that she does not currently use alcohol after a past usage of about 4.0 - 5.0 standard drinks of alcohol per week. She reports current drug use. Drug: Marijuana.  Allergies  Allergen Reactions   Demerol Anaphylaxis    Per patient cardiac arrest    Family History  Problem Relation Age of Onset   Diabetes Mother    Hypertension Mother    COPD Mother    Cirrhosis Father        ETOH   Diabetes Sister    Hypertension Sister    Crohn's disease  Maternal Grandmother    Cancer Other    Colon cancer Neg Hx    Autoimmune disease Neg Hx     Prior to Admission medications   Medication Sig Start Date End Date Taking? Authorizing Provider  CONSTULOSE 10 GM/15ML solution Take 30 mLs (20 g total) by mouth 3 (three) times daily. 12/22/21  Yes Erick Blinks, MD  furosemide (LASIX) 40 MG tablet TAKE 1 TABLET BY MOUTH DAILY 01/02/22  Yes Gelene Mink, NP  gabapentin (NEURONTIN) 300 MG capsule Take 300 mg by mouth 3 (three) times daily as needed (nerve pain). 02/25/21  Yes [provider]  hydrOXYzine (VISTARIL) 25 MG capsule Take 25 mg by mouth 3 (three) times daily as needed for anxiety. 11/13/21  Yes [provider]  methocarbamol (ROBAXIN) 500 MG tablet Take 500 mg by mouth at bedtime as needed for muscle spasms. 11/29/21  Yes [provider]  metoCLOPramide (REGLAN) 5 MG tablet Take 5 mg by mouth 2 (two) times daily as needed. 01/14/22  Yes [provider]  ondansetron (ZOFRAN-ODT) 4 MG disintegrating tablet Take 1 tablet (4 mg total) by mouth every 8 (eight) hours as needed for nausea or vomiting. 12/03/21  Yes Gloris Manchester, MD  pantoprazole (PROTONIX) 40 MG tablet Take 1 tablet (40 mg total) by mouth 2 (two) times daily before a meal. Patient taking differently: Take 80 mg by mouth daily. 03/30/21  Yes Erick Blinks, MD  spironolactone (ALDACTONE) 100 MG tablet TAKE 1 TABLET BY MOUTH DAILY 01/02/22  Yes Gelene Mink, NP  metoCLOPramide (REGLAN) 10 MG tablet Take 1 tablet (10 mg total) by mouth every 8 (eight) hours as needed for nausea or vomiting. Patient not taking: Reported on 02/10/2022 12/03/21   Gloris Manchester, MD    Physical Exam: Vitals:   02/10/22 1620 02/10/22 2036 02/10/22 2101 02/10/22 2121  BP:  (!) 127/56 (!) 102/56 106/63  Pulse:  (!) 101 92 94  Resp:  10 14 16   Temp:   97.8 F (36.6 C) 98 F (36.7 C)  TempSrc:   Oral Oral  SpO2:  100% 98% 100%  Weight: 70.5 kg     Height:       1.   General: Patient lying supine in bed,  no acute distress   2. Psychiatric: Alert and oriented x 3, mood and behavior normal for situation, pleasant and cooperative with exam   3. Neurologic: Speech and language are normal, face is symmetric, moves all 4 extremities voluntarily, at baseline without acute deficits on limited exam   4. HEENMT:  Head is atraumatic, normocephalic, pupils reactive to light, neck is supple, trachea is midline, mucous membranes are moist   5. Respiratory : Lungs are clear to auscultation bilaterally without wheezing, rhonchi, rales, no cyanosis, no increase in work of breathing or accessory muscle use   6.  Cardiovascular : Heart rate normal, rhythm is regular, no murmurs, rubs or gallops, no peripheral edema, peripheral pulses palpated   7. Gastrointestinal:  Abdomen is taut, distended like a gestational belly, small ventral hernia, tender to palpation on the left    8. Skin:  Skin is warm, dry and intact without rashes, acute lesions, or ulcers on limited exam   9.Musculoskeletal:  No acute deformities or trauma, no asymmetry in tone, no peripheral edema, peripheral pulses palpated, no tenderness to palpation in the extremities  Data Reviewed: In the ED Temp 98.5, heart rate 110, respiratory rate 14, blood pressure 123/73, satting at 100% Leukocytosis with a white blood cell count 9.3, hemoglobin 7.1, platelets 138 Chemistry reveals a hyponatremia at 122, hypokalemia at 3.4, hypochloremia 91 AST 74, ALT 43, ammonia 143, T. bili 3.4, lactic acid 2.0 with repeat pending with a.m. labs Ultrasound done on Thursday showed minimum mild amount of ascites noted not adequate for paracentesis, repeat ultrasound admission Patient was given NS, lactulose, Phenergan and 1 unit PRBC transfusion in the ED  Assessment and Plan: * Hepatic encephalopathy (HCC) - Worsening fatigue over 1 week - Ammonia level is 143 - Lactulose ordered in the ED - Trend ammonia in the  a.m. - Anticipate improved mentation with correction of ammonia - Secondary to alcoholic cirrhosis  Hemorrhoids - Patient reports bleeding hemorrhoids - Likely related to patient's anemia - Check FOBT - Continue to monitor  Ascites due to alcoholic cirrhosis (HCC) - Last paracentesis that was scheduled did not reveal enough fluid to tap - Ultrasound in the a.m. with plan for paracentesis in the a.m. - Patient quit drinking 11 months ago - Continue to monitor  Tobacco use disorder - Declines nicotine patch at this time - Counseled on importance of cessation  Hyponatremia - Sodium 122 - Last month sodium had been up to 137 - Likely related to volume overload, but patient is reluctant to use Lasix because she feels dehydrated - Paracentesis in the a.m. - NS given in the ED - Consider adding sodium tabs if no improvement - Trend sodium in the a.m.  Hypokalemia - Potassium 3.4 - Replace and recheck  Symptomatic anemia - Hemoglobin down to 7.1 from 9.2 - Patient has received multiple blood transfusions in the past - Transfuse 1 unit ordered in the ED - Anemia is likely multifactorial with anemia of chronic disease and hemorrhoidal bleeding contributing - Trend CBC in the a.m.      Advance Care Planning:   Code Status: Full Code  Consults: None  Family Communication: No bedside  Severity of Illness: The appropriate patient status for this patient is OBSERVATION. Observation status is judged to be reasonable and necessary in order to provide the required intensity of service to ensure the patient's safety. The patient's presenting symptoms, physical exam findings, and initial radiographic and laboratory data in the context of their medical condition is felt to place them at decreased risk for further clinical deterioration. Furthermore, it is anticipated that the patient will be medically stable for discharge from the hospital within 2 midnights of admission.    Author: Rolla Plate, DO 02/10/2022 9:33 PM  For on call review [email or web address].

## 2022-02-11 ENCOUNTER — Encounter (HOSPITAL_COMMUNITY): Payer: Self-pay | Admitting: Family Medicine

## 2022-02-11 ENCOUNTER — Encounter: Payer: Self-pay | Admitting: Gastroenterology

## 2022-02-11 ENCOUNTER — Other Ambulatory Visit: Payer: Self-pay

## 2022-02-11 ENCOUNTER — Observation Stay (HOSPITAL_COMMUNITY): Payer: Medicaid Other

## 2022-02-11 DIAGNOSIS — K7031 Alcoholic cirrhosis of liver with ascites: Secondary | ICD-10-CM

## 2022-02-11 DIAGNOSIS — R188 Other ascites: Secondary | ICD-10-CM

## 2022-02-11 DIAGNOSIS — K7682 Hepatic encephalopathy: Secondary | ICD-10-CM | POA: Diagnosis not present

## 2022-02-11 LAB — COMPREHENSIVE METABOLIC PANEL
ALT: 43 U/L (ref 0–44)
AST: 79 U/L — ABNORMAL HIGH (ref 15–41)
Albumin: 2.8 g/dL — ABNORMAL LOW (ref 3.5–5.0)
Alkaline Phosphatase: 89 U/L (ref 38–126)
Anion gap: 11 (ref 5–15)
BUN: 15 mg/dL (ref 6–20)
CO2: 20 mmol/L — ABNORMAL LOW (ref 22–32)
Calcium: 8.1 mg/dL — ABNORMAL LOW (ref 8.9–10.3)
Chloride: 93 mmol/L — ABNORMAL LOW (ref 98–111)
Creatinine, Ser: 0.78 mg/dL (ref 0.44–1.00)
GFR, Estimated: >60 mL/min (ref >60)
Glucose, Bld: 102 mg/dL — ABNORMAL HIGH (ref 70–99)
Potassium: 3.6 mmol/L (ref 3.5–5.1)
Sodium: 124 mmol/L — ABNORMAL LOW (ref 135–145)
Total Bilirubin: 5 mg/dL — ABNORMAL HIGH (ref 0.3–1.2)
Total Protein: 5.4 g/dL — ABNORMAL LOW (ref 6.5–8.1)

## 2022-02-11 LAB — SODIUM: Sodium: 124 mmol/L — ABNORMAL LOW (ref 135–145)

## 2022-02-11 LAB — TYPE AND SCREEN
ABO/RH(D): O POS
Antibody Screen: NEGATIVE
Unit division: 0

## 2022-02-11 LAB — CBC WITH DIFFERENTIAL/PLATELET
Abs Immature Granulocytes: 0.01 10*3/uL (ref 0.00–0.07)
Basophils Absolute: 0.1 10*3/uL (ref 0.0–0.1)
Basophils Relative: 1 %
Eosinophils Absolute: 0.3 10*3/uL (ref 0.0–0.5)
Eosinophils Relative: 3 %
HCT: 27.3 % — ABNORMAL LOW (ref 36.0–46.0)
Hemoglobin: 8.4 g/dL — ABNORMAL LOW (ref 12.0–15.0)
Immature Granulocytes: 0 %
Lymphocytes Relative: 50 %
Lymphs Abs: 4.1 10*3/uL — ABNORMAL HIGH (ref 0.7–4.0)
MCH: 28.9 pg (ref 26.0–34.0)
MCHC: 30.8 g/dL (ref 30.0–36.0)
MCV: 93.8 fL (ref 80.0–100.0)
Monocytes Absolute: 0.8 10*3/uL (ref 0.1–1.0)
Monocytes Relative: 10 %
Neutro Abs: 2.9 10*3/uL (ref 1.7–7.7)
Neutrophils Relative %: 36 %
Platelets: 133 10*3/uL — ABNORMAL LOW (ref 150–400)
RBC: 2.91 MIL/uL — ABNORMAL LOW (ref 3.87–5.11)
RDW: 17.8 % — ABNORMAL HIGH (ref 11.5–15.5)
WBC: 8.2 10*3/uL (ref 4.0–10.5)
nRBC: 0 % (ref 0.0–0.2)

## 2022-02-11 LAB — URINALYSIS, ROUTINE W REFLEX MICROSCOPIC
Bilirubin Urine: NEGATIVE
Glucose, UA: NEGATIVE mg/dL
Hgb urine dipstick: NEGATIVE
Ketones, ur: NEGATIVE mg/dL
Leukocytes,Ua: NEGATIVE
Nitrite: NEGATIVE
Protein, ur: NEGATIVE mg/dL
Specific Gravity, Urine: 1.018 (ref 1.005–1.030)
pH: 5 (ref 5.0–8.0)

## 2022-02-11 LAB — BASIC METABOLIC PANEL
Anion gap: 6 (ref 5–15)
Anion gap: 7 (ref 5–15)
BUN: 14 mg/dL (ref 6–20)
BUN: 15 mg/dL (ref 6–20)
CO2: 22 mmol/L (ref 22–32)
CO2: 22 mmol/L (ref 22–32)
Calcium: 7.7 mg/dL — ABNORMAL LOW (ref 8.9–10.3)
Calcium: 7.6 mg/dL — ABNORMAL LOW (ref 8.9–10.3)
Chloride: 96 mmol/L — ABNORMAL LOW (ref 98–111)
Chloride: 95 mmol/L — ABNORMAL LOW (ref 98–111)
Creatinine, Ser: 0.84 mg/dL (ref 0.44–1.00)
Creatinine, Ser: 0.83 mg/dL (ref 0.44–1.00)
GFR, Estimated: >60 mL/min (ref >60)
GFR, Estimated: >60 mL/min (ref >60)
Glucose, Bld: 108 mg/dL — ABNORMAL HIGH (ref 70–99)
Glucose, Bld: 145 mg/dL — ABNORMAL HIGH (ref 70–99)
Potassium: 3.5 mmol/L (ref 3.5–5.1)
Potassium: 3.4 mmol/L — ABNORMAL LOW (ref 3.5–5.1)
Sodium: 124 mmol/L — ABNORMAL LOW (ref 135–145)
Sodium: 124 mmol/L — ABNORMAL LOW (ref 135–145)

## 2022-02-11 LAB — MAGNESIUM: Magnesium: 1.7 mg/dL (ref 1.7–2.4)

## 2022-02-11 LAB — BPAM RBC
Blood Product Expiration Date: 202402132359
ISSUE DATE / TIME: 202401142058
Unit Type and Rh: 5100

## 2022-02-11 LAB — AMMONIA: Ammonia: 47 umol/L — ABNORMAL HIGH (ref 9–35)

## 2022-02-11 LAB — POC URINE PREG, ED: Preg Test, Ur: NEGATIVE

## 2022-02-11 LAB — LACTIC ACID, PLASMA: Lactic Acid, Venous: 1.7 mmol/L (ref 0.5–1.9)

## 2022-02-11 MED ORDER — OXYCODONE HCL 5 MG PO TABS
5.0000 mg | ORAL_TABLET | ORAL | Status: DC | PRN
  Administered 2022-02-11 (×3): 5 mg via ORAL
  Filled 2022-02-11 (×3): qty 1

## 2022-02-11 MED ORDER — ACETAMINOPHEN 650 MG RE SUPP: 650.0000 mg | Freq: Four times a day (QID) | RECTAL | Status: DC | PRN

## 2022-02-11 MED ORDER — POTASSIUM CHLORIDE 10 MEQ/100ML IV SOLN
10.0000 meq | INTRAVENOUS | Status: AC
  Administered 2022-02-11 (×2): 10 meq via INTRAVENOUS
  Filled 2022-02-11 (×2): qty 100

## 2022-02-11 MED ORDER — HEPARIN SODIUM (PORCINE) 5000 UNIT/ML IJ SOLN
5000.0000 [IU] | Freq: Three times a day (TID) | INTRAMUSCULAR | Status: DC
  Administered 2022-02-11 (×2): 5000 [IU] via SUBCUTANEOUS
  Filled 2022-02-11 (×3): qty 1

## 2022-02-11 MED ORDER — METOCLOPRAMIDE HCL 10 MG PO TABS
5.0000 mg | ORAL_TABLET | Freq: Two times a day (BID) | ORAL | Status: DC | PRN
  Administered 2022-02-11: 5 mg via ORAL
  Filled 2022-02-11: qty 1

## 2022-02-11 MED ORDER — POTASSIUM CHLORIDE CRYS ER 20 MEQ PO TBCR
40.0000 meq | EXTENDED_RELEASE_TABLET | Freq: Once | ORAL | Status: AC
  Administered 2022-02-11: 40 meq via ORAL
  Filled 2022-02-11: qty 2

## 2022-02-11 MED ORDER — ACETAMINOPHEN 325 MG PO TABS: 650.0000 mg | ORAL_TABLET | Freq: Four times a day (QID) | ORAL | Status: DC | PRN

## 2022-02-11 MED ORDER — GABAPENTIN 300 MG PO CAPS
300.0000 mg | ORAL_CAPSULE | Freq: Three times a day (TID) | ORAL | Status: DC | PRN
  Administered 2022-02-11: 300 mg via ORAL
  Filled 2022-02-11: qty 1

## 2022-02-11 MED ORDER — SPIRONOLACTONE 25 MG PO TABS
100.0000 mg | ORAL_TABLET | Freq: Every day | ORAL | Status: DC
  Administered 2022-02-11: 100 mg via ORAL
  Filled 2022-02-11: qty 4

## 2022-02-11 MED ORDER — PANTOPRAZOLE SODIUM 40 MG PO TBEC
40.0000 mg | DELAYED_RELEASE_TABLET | Freq: Every day | ORAL | Status: DC
  Administered 2022-02-11: 40 mg via ORAL
  Filled 2022-02-11: qty 1

## 2022-02-11 MED ORDER — HYDROXYZINE HCL 25 MG PO TABS
25.0000 mg | ORAL_TABLET | Freq: Three times a day (TID) | ORAL | Status: DC | PRN
  Administered 2022-02-11: 25 mg via ORAL
  Filled 2022-02-11: qty 1

## 2022-02-11 NOTE — Progress Notes (Signed)
Report given to [facility] on 300

## 2022-02-11 NOTE — Progress Notes (Signed)
Patient tolerated right sided paracentesis procedure well today and 4.4 Liters of ascites removed. Patient verbalized understanding of post procedure instructions and then transported back to inpatient bed assignment this time with no acute distress noted.

## 2022-02-11 NOTE — Procedures (Signed)
PreOperative Dx: Alcoholic cirrhosis, ascites Postoperative Dx: Alcoholic cirrhosis, ascites Procedure:   US guided paracentesis Radiologist:  Thornton Papas Anesthesia:  10 ml of 1% lidocaine Specimen:  4.4 L of yellow ascitic fluid EBL:   < 1 ml Complications:  None

## 2022-02-11 NOTE — Progress Notes (Signed)
  Transition of Care [facility]) Screening Note   Patient Details  Name: Erica Banks Date of Birth: [birth year]   [facility] CM/SW Contact:    Iona Beard, [place] Phone Number: 02/11/2022, 12:13 PM    Transition of Care Department [facility]) has reviewed patient and no TOC needs have been identified at this time. We will continue to monitor patient advancement through interdisciplinary progression rounds. If new patient transition needs arise, please place a TOC consult.

## 2022-02-11 NOTE — Discharge Summary (Signed)
Physician Discharge Summary  Erica Banks EGB:[id number] DOB: 02/23/1980 DOA: 02/10/2022  PCP: Erica Burly, MD  Admit date: 02/10/2022 Discharge date: 02/11/2022 30 Day Unplanned Readmission Risk Score    Flowsheet Row ED to Hosp-Admission (Discharged) from 12/18/2021 in [facility]  30 Day Unplanned Readmission Risk Score (%) 18.1 Filed at 12/22/2021 1200       This score is the patient's risk of an unplanned readmission within 30 days of being discharged (0 -100%). The score is based on dignosis, age, lab data, medications, orders, and past utilization.   Low:  0-14.9   Medium: 15-21.9   High: 22-29.9   Extreme: 30 and above          Admitted From: Home Disposition: Home  Recommendations for Outpatient Follow-up:  Follow up with PCP in 1-2 weeks Please obtain BMP/CBC in one week Please follow up with your PCP on the following pending results: Unresulted Labs (From admission, onward)     Start     Ordered   Unscheduled  Occult blood card to lab, stool  As needed,   R     Comments: Okay to wait for next bowel movement    02/10/22 2132              Home Health: None Equipment/Devices: None  Discharge Condition: Stable CODE STATUS: Full code Diet recommendation: Cardiac  HPI: Erica Banks is a 42 y.o. female with medical history significant of anemia, cirrhosis, neuropathy, history of DVT/PE, and more presents ED with a chief complaint of abdominal pain and weakness.  Patient reports that last week she came down with a viral illness.  She had fatigue and malaise.  She had left flank pain and left upper quadrant pain.  She reports she became so generally weak that she could not even get around.  She had diarrhea, but no vomiting.  She reports she did feel nauseous.  Patient reports that this has been going on for over a week now.  She had decreased urine output, she has had decreased p.o. intake.  She reports there is blood in the toilet  when she has diarrhea but she believes that to be from her hemorrhoids.   She has had multiple paracenteses in the past.  She went for 1 on Thursday and told her that was not enough fluid to tap.  They told her to call back on Monday if it was still bad.  Patient reports that her abdomen has been swelling since then and it is definitely worse today.  She has had no fever.  She does have dyspnea that is related to her ascites.  She has no inhaler at home.   Patient currently smokes cigarettes and marijuana.  She declines nicotine patch at this time.  He is not vaccinated for COVID or flu.  Patient is full code.  Patient reports she did quit drinking about 11 months ago.  Subjective: Seen and examined after paracentesis, she was fully alert and oriented and she told me that she was never confused given yesterday, the only reason she came in because she was feeling weak due to excessive acetic fluid and now that she has had therapeutic paracentesis, she feels stronger and she desires to go home.  Brief/Interim Summary: Patient was basically admitted to hospital service due to ascites.  There is documentation that she was also having hepatic encephalopathy however patient did have elevated ammonia of 143 which is higher than her baseline but  patient was always alert and oriented so she did not have hepatic encephalopathy, hepatic encephalopathy ruled out.  Patient underwent almost 4 L of fluid taken out by therapeutic paracentesis today.  Her major complaint was weakness yesterday and her weakness has improved after paracentesis.  She also came in with acute on chronic hyponatremia, sodium was 122.  Looking back, her sodium has always remained low.  Currently 144 but patient has no symptoms regarding hyponatremia.  She also confirmed that her sodium remains low.  Her potassium was also low which is low again and this will be replaced again today.  Rest of the medical issues have remained stable.  Patient desires  to go home and she feels that she is strong enough at this point in time.  Based on her my clinical assessment and her wishes, she is being discharged home.  Resume all home medications.  Discharge plan was discussed with patient and/or family member and they verbalized understanding and agreed with it.  Discharge Diagnoses:  Principal Problem:   Hepatic encephalopathy (HCC) Active Problems:   Symptomatic anemia   Hypokalemia   Hyponatremia   Tobacco use disorder   Ascites due to alcoholic cirrhosis (HCC)   Hemorrhoids    Discharge Instructions   Allergies as of 02/11/2022       Reactions   Demerol Anaphylaxis   Per patient cardiac arrest        Medication List     TAKE these medications    Constulose 10 GM/15ML solution Generic drug: lactulose Take 30 mLs (20 g total) by mouth 3 (three) times daily.   furosemide 40 MG tablet Commonly known as: LASIX TAKE 1 TABLET BY MOUTH DAILY   gabapentin 300 MG capsule Commonly known as: NEURONTIN Take 300 mg by mouth 3 (three) times daily as needed (nerve pain).   hydrOXYzine 25 MG capsule Commonly known as: VISTARIL Take 25 mg by mouth 3 (three) times daily as needed for anxiety.   methocarbamol 500 MG tablet Commonly known as: ROBAXIN Take 500 mg by mouth at bedtime as needed for muscle spasms.   metoCLOPramide 5 MG tablet Commonly known as: REGLAN Take 5 mg by mouth 2 (two) times daily as needed. What changed: Another medication with the same name was removed. Continue taking this medication, and follow the directions you see here.   ondansetron 4 MG disintegrating tablet Commonly known as: ZOFRAN-ODT Take 1 tablet (4 mg total) by mouth every 8 (eight) hours as needed for nausea or vomiting.   pantoprazole 40 MG tablet Commonly known as: PROTONIX Take 1 tablet (40 mg total) by mouth 2 (two) times daily before a meal. What changed:  how much to take when to take this   spironolactone 100 MG tablet Commonly  known as: ALDACTONE TAKE 1 TABLET BY MOUTH DAILY        Follow-up Information     Toma Deiters, MD Follow up in 1 week(s).   Specialty: Internal Medicine Contact information: [address] [place] [address] [phone]                Allergies  Allergen Reactions   Demerol Anaphylaxis    Per patient cardiac arrest    Consultations: IR   Procedures/Studies: US Paracentesis  Result Date: 02/11/2022 INDICATION: Alcoholic cirrhosis, ascites EXAM: ULTRASOUND GUIDED THERAPEUTIC PARACENTESIS MEDICATIONS: None. COMPLICATIONS: None immediate. PROCEDURE: Informed written consent was obtained from the patient after a discussion of the risks, benefits and alternatives to treatment. A  timeout was performed prior to the initiation of the procedure. Initial ultrasound scanning demonstrates a large amount of ascites within the right lower abdominal quadrant. The right lower abdomen was prepped and draped in the usual sterile fashion. 1% lidocaine was used for local anesthesia. Following this, a 19 gauge, 7-cm, Yueh catheter was introduced. An ultrasound image was saved for documentation purposes. The paracentesis was performed. The catheter was removed and a dressing was applied. The patient tolerated the procedure well without immediate post procedural complication. Patient received post-procedure intravenous albumin; see nursing notes for details. FINDINGS: A total of approximately 4.4 L of yellow colored fluid was removed. IMPRESSION: Successful ultrasound-guided paracentesis yielding 4.4 liters of peritoneal fluid. Electronically Signed   By: Ulyses Southward M.D.   On: 02/11/2022 10:30   [place] ASCITES (ABDOMEN LIMITED)  Result Date: 02/05/2022 CLINICAL DATA:  Ascites. EXAM: LIMITED ABDOMEN ULTRASOUND FOR ASCITES TECHNIQUE: Limited ultrasound survey for ascites was performed in all four abdominal quadrants. COMPARISON:  January 31, 2022. FINDINGS: Only a minimal to mild amount of ascites  is noted in the right lower quadrant, with no significant ascites seen in the other quadrants. IMPRESSION: Minimal to mild amount of ascites is noted. No adequate fluid pocket for paracentesis is noted. Electronically Signed   By: Lupita Raider M.D.   On: 02/05/2022 12:56   US Paracentesis  Result Date: 01/31/2022 INDICATION: Alcohol Cirrhosis; recurrent ascites EXAM: ULTRASOUND GUIDED RLQ PARACENTESIS MEDICATIONS: 10 cc 1% lidocaine. COMPLICATIONS: None immediate. PROCEDURE: Informed written consent was obtained from the patient after a discussion of the risks, benefits and alternatives to treatment. A timeout was performed prior to the initiation of the procedure. Initial ultrasound scanning demonstrates a large amount of ascites within the right lower abdominal quadrant. The right lower abdomen was prepped and draped in the usual sterile fashion. 1% lidocaine was used for local anesthesia. Following this, a 7 cm Yueh catheter was introduced. An ultrasound image was saved for documentation purposes. The paracentesis was performed. The catheter was removed and a dressing was applied. The patient tolerated the procedure well without immediate post procedural complication. FINDINGS: A total of approximately 1.4 liters of yellow fluid was removed. Samples were sent to the laboratory as requested by the clinical team. IMPRESSION: Successful ultrasound-guided paracentesis yielding 1.4 liters of peritoneal fluid. PLAN: The patient has required >/=2 paracenteses in a 30 day period and a formal evaluation by the [facility] has been arranged. Read by Robet Leu [facility] Electronically Signed   By: Marliss Coots M.D.   On: 01/31/2022 10:31     Discharge Exam: Vitals:   02/11/22 0930 02/11/22 1026  BP: 129/70 132/67  Pulse: 86 87  Resp: 18 19  Temp:  98.2 F (36.8 C)  SpO2: 95% 98%   Vitals:   02/11/22 0910 02/11/22 0920 02/11/22 0930 02/11/22 1026  BP:  115/67 130/64 129/70 132/67  Pulse: 88 87 86 87  Resp: 18 18 18 19   Temp:    98.2 F (36.8 C)  TempSrc:    Oral  SpO2: 95% 95% 95% 98%  Weight:    67.6 kg  Height:    5\' 6"  (1.676 m)    General: Pt is alert, awake, not in acute distress Cardiovascular: RRR, S1/S2 +, no rubs, no gallops Respiratory: CTA bilaterally, no wheezing, no rhonchi Abdominal: Soft, NT, mildly distended with positive fluid shift, bowel sounds + Extremities: no edema, no cyanosis    The results of significant  diagnostics from this hospitalization (including imaging, microbiology, ancillary and laboratory) are listed below for reference.     Microbiology: No results found for this or any previous visit (from the past 240 hour(s)).   Labs: BNP (last 3 results) No results for input(s): "BNP" in the last 8760 hours. Basic Metabolic Panel: Recent Labs  Lab 02/05/22 1150 02/10/22 1723 02/11/22 0212 02/11/22 0558 02/11/22 1333 02/11/22 1335  NA 127* 122* 124* 124* 124* 124*  K 3.7 3.4* 3.5 3.6  --  3.4*  CL 89* 91* 96* 93*  --  95*  CO2 28 23 22  20*  --  22  GLUCOSE 96 118* 108* 102*  --  145*  BUN 9 14 14 15   --  15  CREATININE 0.78 0.86 0.84 0.78  --  0.83  CALCIUM 8.3* 7.9* 7.7* 8.1*  --  7.6*  MG  --  1.8  --  1.7  --   --   PHOS  --  4.0  --   --   --   --    Liver Function Tests: Recent Labs  Lab 02/05/22 1150 02/10/22 1723 02/11/22 0558  AST 68* 74* 79*  ALT 35* 43 43  ALKPHOS  --  86 89  BILITOT 4.1* 3.4* 5.0*  PROT 5.8* 5.1* 5.4*  ALBUMIN  --  2.7* 2.8*   Recent Labs  Lab 02/10/22 1723  LIPASE 42   Recent Labs  Lab 02/10/22 1723 02/11/22 0558  AMMONIA 143* 47*   CBC: Recent Labs  Lab 02/05/22 1150 02/10/22 1723 02/11/22 0558  WBC 7.6 9.3 8.2  NEUTROABS 4,127 4.5 2.9  HGB 9.2* 7.1* 8.4*  HCT 27.3* 22.1* 27.3*  MCV 88.6 92.5 93.8  PLT 137* 138* 133*   Cardiac Enzymes: No results for input(s): "CKTOTAL", "CKMB", "CKMBINDEX", "TROPONINI" in the last 168  hours. BNP: Invalid input(s): "POCBNP" CBG: No results for input(s): "GLUCAP" in the last 168 hours. D-Dimer No results for input(s): "DDIMER" in the last 72 hours. Hgb A1c No results for input(s): "HGBA1C" in the last 72 hours. Lipid Profile No results for input(s): "CHOL", "HDL", "LDLCALC", "TRIG", "CHOLHDL", "LDLDIRECT" in the last 72 hours. Thyroid function studies No results for input(s): "TSH", "T4TOTAL", "T3FREE", "THYROIDAB" in the last 72 hours.  Invalid input(s): "FREET3" Anemia work up No results for input(s): "VITAMINB12", "FOLATE", "FERRITIN", "TIBC", "IRON", "RETICCTPCT" in the last 72 hours. Urinalysis    Component Value Date/Time   COLORURINE AMBER (A) 02/11/2022 0026   APPEARANCEUR HAZY (A) 02/11/2022 0026   LABSPEC 1.018 02/11/2022 0026   PHURINE 5.0 02/11/2022 0026   GLUCOSEU NEGATIVE 02/11/2022 0026   HGBUR NEGATIVE 02/11/2022 0026   BILIRUBINUR NEGATIVE 02/11/2022 0026   KETONESUR NEGATIVE 02/11/2022 0026   PROTEINUR NEGATIVE 02/11/2022 0026   UROBILINOGEN 0.2 04/10/2013 1432   NITRITE NEGATIVE 02/11/2022 0026   LEUKOCYTESUR NEGATIVE 02/11/2022 0026   Sepsis Labs Recent Labs  Lab 02/05/22 1150 02/10/22 1723 02/11/22 0558  WBC 7.6 9.3 8.2   Microbiology No results found for this or any previous visit (from the past 240 hour(s)).   Time coordinating discharge: Over 30 minutes  SIGNED:   Darliss Cheney, MD  [facility] 02/11/2022, 2:46 PM *Please note that this is a verbal dictation therefore any spelling or grammatical errors are due to the "[employer]" system interpretation. If 7PM-7AM, please contact night-coverage [email or web address]

## 2022-02-13 ENCOUNTER — Emergency Department (HOSPITAL_COMMUNITY): Payer: Medicaid Other

## 2022-02-13 ENCOUNTER — Inpatient Hospital Stay (HOSPITAL_COMMUNITY)
Admission: EM | Admit: 2022-02-13 | Discharge: 2022-02-16 | DRG: 442 | Disposition: A | Discharge status: Home / Self Care | Payer: Medicaid Other | Attending: Family Medicine | Admitting: Family Medicine

## 2022-02-13 ENCOUNTER — Other Ambulatory Visit: Payer: Self-pay

## 2022-02-13 ENCOUNTER — Encounter (HOSPITAL_COMMUNITY): Payer: Self-pay | Admitting: Emergency Medicine

## 2022-02-13 DIAGNOSIS — Z79899 Other long term (current) drug therapy: Secondary | ICD-10-CM

## 2022-02-13 DIAGNOSIS — Z885 Allergy status to narcotic agent status: Secondary | ICD-10-CM

## 2022-02-13 DIAGNOSIS — R109 Unspecified abdominal pain: Secondary | ICD-10-CM | POA: Insufficient documentation

## 2022-02-13 DIAGNOSIS — Z825 Family history of asthma and other chronic lower respiratory diseases: Secondary | ICD-10-CM

## 2022-02-13 DIAGNOSIS — Z86718 Personal history of other venous thrombosis and embolism: Secondary | ICD-10-CM

## 2022-02-13 DIAGNOSIS — E871 Hypo-osmolality and hyponatremia: Secondary | ICD-10-CM

## 2022-02-13 DIAGNOSIS — Z8249 Family history of ischemic heart disease and other diseases of the circulatory system: Secondary | ICD-10-CM

## 2022-02-13 DIAGNOSIS — E876 Hypokalemia: Secondary | ICD-10-CM | POA: Diagnosis present

## 2022-02-13 DIAGNOSIS — Z833 Family history of diabetes mellitus: Secondary | ICD-10-CM

## 2022-02-13 DIAGNOSIS — D649 Anemia, unspecified: Secondary | ICD-10-CM | POA: Diagnosis present

## 2022-02-13 DIAGNOSIS — Z8379 Family history of other diseases of the digestive system: Secondary | ICD-10-CM

## 2022-02-13 DIAGNOSIS — Z91148 Patient's other noncompliance with medication regimen for other reason: Secondary | ICD-10-CM

## 2022-02-13 DIAGNOSIS — F17210 Nicotine dependence, cigarettes, uncomplicated: Secondary | ICD-10-CM | POA: Diagnosis present

## 2022-02-13 DIAGNOSIS — R1084 Generalized abdominal pain: Secondary | ICD-10-CM

## 2022-02-13 DIAGNOSIS — R7401 Elevation of levels of liver transaminase levels: Secondary | ICD-10-CM | POA: Insufficient documentation

## 2022-02-13 DIAGNOSIS — G629 Polyneuropathy, unspecified: Secondary | ICD-10-CM | POA: Diagnosis present

## 2022-02-13 DIAGNOSIS — E8809 Other disorders of plasma-protein metabolism, not elsewhere classified: Secondary | ICD-10-CM | POA: Diagnosis present

## 2022-02-13 DIAGNOSIS — Z86711 Personal history of pulmonary embolism: Secondary | ICD-10-CM

## 2022-02-13 DIAGNOSIS — Z8489 Family history of other specified conditions: Secondary | ICD-10-CM

## 2022-02-13 DIAGNOSIS — K7031 Alcoholic cirrhosis of liver with ascites: Secondary | ICD-10-CM | POA: Diagnosis present

## 2022-02-13 DIAGNOSIS — K7682 Hepatic encephalopathy: Principal | ICD-10-CM | POA: Diagnosis present

## 2022-02-13 LAB — CBC WITH DIFFERENTIAL/PLATELET
Abs Immature Granulocytes: 0 10*3/uL (ref 0.00–0.07)
Band Neutrophils: 1 %
Basophils Absolute: 0.1 10*3/uL (ref 0.0–0.1)
Basophils Relative: 1 %
Eosinophils Absolute: 0.1 10*3/uL (ref 0.0–0.5)
Eosinophils Relative: 2 %
HCT: 25.6 % — ABNORMAL LOW (ref 36.0–46.0)
Hemoglobin: 8.3 g/dL — ABNORMAL LOW (ref 12.0–15.0)
Lymphocytes Relative: 48 %
Lymphs Abs: 2.8 10*3/uL (ref 0.7–4.0)
MCH: 30 pg (ref 26.0–34.0)
MCHC: 32.4 g/dL (ref 30.0–36.0)
MCV: 92.4 fL (ref 80.0–100.0)
Monocytes Absolute: 0.4 10*3/uL (ref 0.1–1.0)
Monocytes Relative: 7 %
Neutro Abs: 2.5 10*3/uL (ref 1.7–7.7)
Neutrophils Relative %: 41 %
Platelets: 134 10*3/uL — ABNORMAL LOW (ref 150–400)
RBC: 2.77 MIL/uL — ABNORMAL LOW (ref 3.87–5.11)
RDW: 17.2 % — ABNORMAL HIGH (ref 11.5–15.5)
WBC: 5.9 10*3/uL (ref 4.0–10.5)
nRBC: 0 % (ref 0.0–0.2)

## 2022-02-13 LAB — NA AND K (SODIUM & POTASSIUM), RAND UR
Potassium Urine: 14 mmol/L
Sodium, Ur: <10 mmol/L

## 2022-02-13 LAB — COMPREHENSIVE METABOLIC PANEL
ALT: 47 U/L — ABNORMAL HIGH (ref 0–44)
AST: 78 U/L — ABNORMAL HIGH (ref 15–41)
Albumin: 3 g/dL — ABNORMAL LOW (ref 3.5–5.0)
Alkaline Phosphatase: 96 U/L (ref 38–126)
Anion gap: 9 (ref 5–15)
BUN: 14 mg/dL (ref 6–20)
CO2: 20 mmol/L — ABNORMAL LOW (ref 22–32)
Calcium: 8.3 mg/dL — ABNORMAL LOW (ref 8.9–10.3)
Chloride: 95 mmol/L — ABNORMAL LOW (ref 98–111)
Creatinine, Ser: 0.97 mg/dL (ref 0.44–1.00)
GFR, Estimated: >60 mL/min (ref >60)
Glucose, Bld: 111 mg/dL — ABNORMAL HIGH (ref 70–99)
Potassium: 4.2 mmol/L (ref 3.5–5.1)
Sodium: 124 mmol/L — ABNORMAL LOW (ref 135–145)
Total Bilirubin: 3.8 mg/dL — ABNORMAL HIGH (ref 0.3–1.2)
Total Protein: 5.9 g/dL — ABNORMAL LOW (ref 6.5–8.1)

## 2022-02-13 LAB — ETHANOL: Alcohol, Ethyl (B): <10 mg/dL (ref <10)

## 2022-02-13 LAB — ALBUMIN, PLEURAL OR PERITONEAL FLUID: Albumin, Fluid: <1.5 g/dL

## 2022-02-13 LAB — URINALYSIS, ROUTINE W REFLEX MICROSCOPIC
Bilirubin Urine: NEGATIVE
Glucose, UA: NEGATIVE mg/dL
Hgb urine dipstick: NEGATIVE
Ketones, ur: NEGATIVE mg/dL
Leukocytes,Ua: NEGATIVE
Nitrite: NEGATIVE
Protein, ur: NEGATIVE mg/dL
Specific Gravity, Urine: 1.038 — ABNORMAL HIGH (ref 1.005–1.030)
pH: 6 (ref 5.0–8.0)

## 2022-02-13 LAB — GLUCOSE, PLEURAL OR PERITONEAL FLUID: Glucose, Fluid: 124 mg/dL

## 2022-02-13 LAB — MAGNESIUM: Magnesium: 1.6 mg/dL — ABNORMAL LOW (ref 1.7–2.4)

## 2022-02-13 LAB — BODY FLUID CELL COUNT WITH DIFFERENTIAL
Eos, Fluid: 0 %
Lymphs, Fluid: 47 %
Monocyte-Macrophage-Serous Fluid: 37 % — ABNORMAL LOW (ref 50–90)
Neutrophil Count, Fluid: 16 % (ref 0–25)
Total Nucleated Cell Count, Fluid: 72 cu mm (ref 0–1000)

## 2022-02-13 LAB — TYPE AND SCREEN
ABO/RH(D): O POS
Antibody Screen: NEGATIVE

## 2022-02-13 LAB — PROTEIN, PLEURAL OR PERITONEAL FLUID: Total protein, fluid: <3 g/dL

## 2022-02-13 LAB — LIPASE, BLOOD: Lipase: 42 U/L (ref 11–51)

## 2022-02-13 LAB — GRAM STAIN

## 2022-02-13 LAB — LACTATE DEHYDROGENASE, PLEURAL OR PERITONEAL FLUID: LD, Fluid: <25 U/L — ABNORMAL HIGH (ref 3–23)

## 2022-02-13 LAB — LACTIC ACID, PLASMA
Lactic Acid, Venous: 2 mmol/L (ref 0.5–1.9)
Lactic Acid, Venous: 1.8 mmol/L (ref 0.5–1.9)

## 2022-02-13 LAB — PROTIME-INR
INR: 1.7 — ABNORMAL HIGH (ref 0.8–1.2)
Prothrombin Time: 19.5 seconds — ABNORMAL HIGH (ref 11.4–15.2)

## 2022-02-13 LAB — AMMONIA: Ammonia: 101 umol/L — ABNORMAL HIGH (ref 9–35)

## 2022-02-13 MED ORDER — LACTULOSE 10 GM/15ML PO SOLN
10.0000 g | Freq: Once | ORAL | Status: AC
  Administered 2022-02-13: 10 g via ORAL
  Filled 2022-02-13: qty 30

## 2022-02-13 MED ORDER — ACETAMINOPHEN 325 MG PO TABS: 650.0000 mg | ORAL_TABLET | Freq: Four times a day (QID) | ORAL | Status: DC | PRN

## 2022-02-13 MED ORDER — PANTOPRAZOLE SODIUM 40 MG IV SOLR
40.0000 mg | Freq: Once | INTRAVENOUS | Status: AC
  Administered 2022-02-13: 40 mg via INTRAVENOUS
  Filled 2022-02-13: qty 10

## 2022-02-13 MED ORDER — HYDROXYZINE PAMOATE 25 MG PO CAPS: 25.0000 mg | ORAL_CAPSULE | Freq: Three times a day (TID) | ORAL | Status: DC | PRN

## 2022-02-13 MED ORDER — HYDROMORPHONE HCL 1 MG/ML IJ SOLN
0.5000 mg | Freq: Once | INTRAMUSCULAR | Status: AC
  Administered 2022-02-13: 0.5 mg via INTRAVENOUS
  Filled 2022-02-13: qty 0.5

## 2022-02-13 MED ORDER — ONDANSETRON HCL 4 MG/2ML IJ SOLN
4.0000 mg | Freq: Once | INTRAMUSCULAR | Status: AC
  Administered 2022-02-13: 4 mg via INTRAVENOUS
  Filled 2022-02-13: qty 2

## 2022-02-13 MED ORDER — MORPHINE SULFATE (PF) 2 MG/ML IV SOLN
2.0000 mg | INTRAVENOUS | Status: DC | PRN
  Administered 2022-02-14: 2 mg via INTRAVENOUS
  Filled 2022-02-13: qty 1

## 2022-02-13 MED ORDER — ACETAMINOPHEN 650 MG RE SUPP: 650.0000 mg | Freq: Four times a day (QID) | RECTAL | Status: DC | PRN

## 2022-02-13 MED ORDER — PANTOPRAZOLE SODIUM 40 MG PO TBEC
40.0000 mg | DELAYED_RELEASE_TABLET | Freq: Every day | ORAL | Status: DC
  Administered 2022-02-13 – 2022-02-16 (×4): 40 mg via ORAL
  Filled 2022-02-13 (×4): qty 1

## 2022-02-13 MED ORDER — IOHEXOL 300 MG/ML  SOLN
100.0000 mL | Freq: Once | INTRAMUSCULAR | Status: AC | PRN
  Administered 2022-02-13: 80 mL via INTRAVENOUS

## 2022-02-13 MED ORDER — HEPARIN SODIUM (PORCINE) 5000 UNIT/ML IJ SOLN
5000.0000 [IU] | Freq: Three times a day (TID) | INTRAMUSCULAR | Status: DC
  Administered 2022-02-13 – 2022-02-16 (×7): 5000 [IU] via SUBCUTANEOUS
  Filled 2022-02-13 (×7): qty 1

## 2022-02-13 MED ORDER — MAGNESIUM SULFATE 2 GM/50ML IV SOLN
2.0000 g | Freq: Once | INTRAVENOUS | Status: AC
  Administered 2022-02-13: 2 g via INTRAVENOUS
  Filled 2022-02-13: qty 50

## 2022-02-13 MED ORDER — SODIUM CHLORIDE 0.9 % IV BOLUS
500.0000 mL | Freq: Once | INTRAVENOUS | Status: AC
  Administered 2022-02-13: 500 mL via INTRAVENOUS

## 2022-02-13 MED ORDER — SODIUM CHLORIDE 0.9 % IV SOLN
2.0000 g | INTRAVENOUS | Status: DC
  Administered 2022-02-14: 2 g via INTRAVENOUS
  Filled 2022-02-13: qty 20

## 2022-02-13 MED ORDER — ONDANSETRON HCL 4 MG/2ML IJ SOLN
4.0000 mg | Freq: Four times a day (QID) | INTRAMUSCULAR | Status: DC | PRN
  Administered 2022-02-14 – 2022-02-15 (×3): 4 mg via INTRAVENOUS
  Filled 2022-02-13 (×3): qty 2

## 2022-02-13 MED ORDER — OXYCODONE HCL 5 MG PO TABS
5.0000 mg | ORAL_TABLET | ORAL | Status: DC | PRN
  Administered 2022-02-14 – 2022-02-16 (×6): 5 mg via ORAL
  Filled 2022-02-13 (×6): qty 1

## 2022-02-13 MED ORDER — SODIUM CHLORIDE 0.9 % IV SOLN: 1.0000 g | INTRAVENOUS | Status: DC

## 2022-02-13 MED ORDER — GABAPENTIN 300 MG PO CAPS
300.0000 mg | ORAL_CAPSULE | Freq: Three times a day (TID) | ORAL | Status: DC | PRN
  Administered 2022-02-15: 300 mg via ORAL
  Filled 2022-02-13 (×2): qty 1

## 2022-02-13 MED ORDER — FUROSEMIDE 10 MG/ML IJ SOLN
40.0000 mg | Freq: Once | INTRAMUSCULAR | Status: AC
  Administered 2022-02-13: 40 mg via INTRAVENOUS
  Filled 2022-02-13: qty 4

## 2022-02-13 MED ORDER — ONDANSETRON HCL 4 MG PO TABS
4.0000 mg | ORAL_TABLET | Freq: Four times a day (QID) | ORAL | Status: DC | PRN
  Administered 2022-02-16: 4 mg via ORAL
  Filled 2022-02-13: qty 1

## 2022-02-13 MED ORDER — SPIRONOLACTONE 25 MG PO TABS
100.0000 mg | ORAL_TABLET | Freq: Every day | ORAL | Status: DC
  Administered 2022-02-13 – 2022-02-16 (×4): 100 mg via ORAL
  Filled 2022-02-13: qty 1
  Filled 2022-02-13 (×2): qty 4
  Filled 2022-02-13: qty 1

## 2022-02-13 MED ORDER — HYDROXYZINE HCL 25 MG PO TABS
25.0000 mg | ORAL_TABLET | Freq: Three times a day (TID) | ORAL | Status: DC | PRN
  Administered 2022-02-14: 25 mg via ORAL
  Filled 2022-02-13: qty 1

## 2022-02-13 MED ORDER — SODIUM CHLORIDE 0.9 % IV SOLN
2.0000 g | Freq: Once | INTRAVENOUS | Status: AC
  Administered 2022-02-13: 2 g via INTRAVENOUS
  Filled 2022-02-13: qty 20

## 2022-02-13 NOTE — ED Notes (Signed)
Pt walked to bathroom to give urine sample, pt states that the cup leaked out. Pt aware that urine sample has been ordered and will notify staff when she needs to go again.

## 2022-02-13 NOTE — ED Provider Notes (Signed)
[facility] Provider Note  CSN: [id number] Arrival date & time: 02/13/22 1537  Chief Complaint(s) Abdominal Pain and Fatigue (Abdominal distention, )  HPI Erica Banks is a 42 y.o. 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  Bimonthly paracentesis.  Similar presentation 1/14 for which she was admitted and received therapeutic paracentesis.  Spouse at bedside helps to corroborate history.  Intermittent confusion, nausea, vomiting, diarrhea over the past 2 to 3 days.  No abdominal pain/cramping.  Has not taken lactulose in the past 2 days secondary to abdominal discomfort with diarrhea.   Last paracentesis was 1/15 while admitted  F/w Dr Jena Gaussourk GI  Past Medical History Past Medical History:  Diagnosis Date   Anemia    Cirrhosis (HCC)    DVT (deep venous thrombosis) (HCC)    Low iron    Neuropathy    Neuropathy    Panic attacks    Pulmonary embolus (HCC) 2019   Patient Active Problem List   Diagnosis Date Noted   Hepatic encephalopathy (HCC) 02/10/2022   Hemorrhoids 02/10/2022   Generalized weakness 12/19/2021   AKI (acute kidney injury) (HCC) 12/19/2021   Hyperammonemia (HCC) 12/19/2021   Hypomagnesemia 12/19/2021   Ascites due to alcoholic cirrhosis (HCC) 12/18/2021   Hypocalcemia 11/23/2021   Hypoalbuminemia 11/23/2021   Tobacco use disorder 11/23/2021   Esophageal varices without bleeding (HCC) 10/31/2021   Heartburn 08/23/2021   Nausea without vomiting 08/23/2021   Lesion of liver less than 1 cm in diameter    Acute blood loss anemia 03/27/2021   Alcoholic cirrhosis of liver with ascites (HCC) 03/27/2021   Hyponatremia 03/27/2021   Iron deficiency anemia 03/27/2021   Folate deficiency 03/27/2021   Ascites    Cirrhosis of liver with ascites (HCC)    Intractable nausea  and vomiting 07/20/2019   Cholelithiasis 07/20/2019   Prolonged QT interval 07/20/2019   Anemia 01/01/2015   Symptomatic anemia 01/01/2015   Hypokalemia 01/01/2015   Acute gastroenteritis 01/01/2015   Abnormal CT of the abdomen 06/08/2013   Gastroesophageal reflux disease 06/08/2013   Home Medication(s) Prior to Admission medications   Medication Sig Start Date End Date Taking? Authorizing Provider  CONSTULOSE 10 GM/15ML solution Take 30 mLs (20 g total) by mouth 3 (three) times daily. 12/22/21  Yes Erick BlinksMemon, Jehanzeb, MD  furosemide (LASIX) 40 MG tablet TAKE 1 TABLET BY MOUTH DAILY 01/02/22  Yes Gelene MinkBoone, Anna W, NP  gabapentin (NEURONTIN) 300 MG capsule Take 300 mg by mouth 3 (three) times daily as needed (nerve pain). 02/25/21  Yes [provider]  hydrOXYzine (VISTARIL) 25 MG capsule Take 25 mg by mouth 3 (three) times daily as needed for anxiety. 11/13/21  Yes [provider]  methocarbamol (ROBAXIN) 500 MG tablet Take 500 mg by mouth at bedtime as needed for muscle spasms. 11/29/21  Yes [provider]  metoCLOPramide (REGLAN) 5 MG tablet Take 5 mg by mouth 2 (two) times daily as needed. 01/14/22  Yes [provider]  ondansetron (ZOFRAN-ODT) 4 MG disintegrating tablet Take 1 tablet (4 mg total) by mouth every 8 (eight) hours as needed for nausea or vomiting. 12/03/21  Yes Gloris Manchesterixon, Ryan, MD  pantoprazole (PROTONIX) 40 MG tablet Take 1 tablet (40 mg total) by mouth 2 (two) times daily before a meal. Patient taking differently: Take 40 mg by mouth daily. 03/30/21  Yes Erick Blinks, MD  spironolactone (ALDACTONE) 100 MG tablet TAKE 1 TABLET BY MOUTH DAILY 01/02/22  Yes Gelene Mink, NP                                                                                                                                    Past Surgical History Past Surgical History:  Procedure Laterality Date   ABDOMINAL HYSTERECTOMY     BIOPSY N/A 06/24/2013   Procedure: BIOPSY  TERMINAL ILEUM;  Surgeon: Corbin Ade, MD;  [facility];  Service: Endoscopy;  Laterality: N/A;   CHOLECYSTECTOMY     COLONOSCOPY N/A 06/24/2013   AST:[id number] hemorrhoids/otherwise normal    COLONOSCOPY WITH PROPOFOL N/A 05/31/2021   Surgeon: Corbin Ade, MD; ery large nonbleeding internal and external hemorrhoids, portal colopathy.  Recommended repeat colonoscopy in 10 years.   ESOPHAGOGASTRODUODENOSCOPY (EGD) WITH PROPOFOL N/A 03/29/2021   Surgeon: Lanelle Bal, DO;  grade 1 esophageal varices, portal hypertensive gastropathy with friable tissue with spontaneous ooze, normal duodenum.   ESOPHAGOGASTRODUODENOSCOPY (EGD) WITH PROPOFOL N/A 05/31/2021   Surgeon: Corbin Ade, MD;  grade 1-2 esophageal varices without bleeding stigmata, advanced appearing portal hypertensive gastropathy.   ESOPHAGOGASTRODUODENOSCOPY (EGD) WITH PROPOFOL N/A 12/22/2021   Procedure: ESOPHAGOGASTRODUODENOSCOPY (EGD) WITH PROPOFOL;  Surgeon: Dolores Frame, MD;  Location: [facility];  Service: Gastroenterology;  Laterality: N/A;   nasal bone surgery     NASAL SINUS SURGERY     SINUS IRRIGATION     TONSILLECTOMY     TUBAL LIGATION     Family History Family History  Problem Relation Age of Onset   Diabetes Mother    Hypertension Mother    COPD Mother    Cirrhosis Father        ETOH   Diabetes Sister    Hypertension Sister    Crohn's disease Maternal Grandmother    Cancer Other    Colon cancer Neg Hx    Autoimmune disease Neg Hx     Social History Social History   Tobacco Use   Smoking status: Every Day    Packs/day: 0.50    Years: 12.00    Total pack years: 6.00    Types: Cigarettes   Smokeless tobacco: Never  Vaping Use   Vaping Use: Never used  Substance Use Topics   Alcohol use: Not Currently    Alcohol/week: 4.0 - 5.0 standard drinks of alcohol    Types: 4 - 5 Cans of beer per week    Comment: None since February 2023.   Drug use: Yes    Types:  Marijuana    Comment: occasional   Allergies Demerol  Review of Systems Review of Systems  Constitutional:  Positive for fatigue. Negative for activity change and fever.  HENT:  Negative for facial swelling and trouble swallowing.   Eyes:  Negative for discharge and redness.  Respiratory:  Negative for cough  and shortness of breath.   Cardiovascular:  Negative for chest pain and palpitations.  Gastrointestinal:  Positive for abdominal pain, diarrhea, nausea and vomiting.  Genitourinary:  Negative for dysuria and flank pain.  Musculoskeletal:  Negative for back pain and gait problem.  Skin:  Negative for pallor and rash.  Neurological:  Negative for syncope and headaches.  Psychiatric/Behavioral:  Positive for confusion.     Physical Exam Vital Signs  I have reviewed the triage vital signs BP 114/70   Pulse 93   Resp 14   Ht  (1.676 m)   Wt 67.1 kg   LMP  (LMP Unknown)   SpO2 100%   BMI 23.89 kg/m  Physical Exam Vitals and nursing note reviewed.  Constitutional:      General: She is not in acute distress.    Appearance: Normal appearance. She is well-developed. She is not ill-appearing.  HENT:     Head: Normocephalic and atraumatic.     Right Ear: External ear normal.     Left Ear: External ear normal.     Nose: Nose normal.     Mouth/Throat:     Mouth: Mucous membranes are moist.  Eyes:     General: Scleral icterus present.        Right eye: No discharge.        Left eye: No discharge.     Extraocular Movements: Extraocular movements intact.     Pupils: Pupils are equal, round, and reactive to light.  Cardiovascular:     Rate and Rhythm: Normal rate and regular rhythm.     Pulses: Normal pulses.     Heart sounds: Normal heart sounds.  Pulmonary:     Effort: Pulmonary effort is normal. No respiratory distress.     Breath sounds: Normal breath sounds.  Abdominal:     General: Abdomen is flat. There is distension.     Palpations: Abdomen is soft.      Tenderness: There is no abdominal tenderness.  Musculoskeletal:        General: Normal range of motion.     Cervical back: Normal range of motion.     Right lower leg: No edema.     Left lower leg: No edema.  Skin:    General: Skin is warm and dry.     Capillary Refill: Capillary refill takes less than 2 seconds.     Coloration: Skin is jaundiced.  Neurological:     Mental Status: She is alert and oriented to person, place, and time.     GCS: GCS eye subscore is 4. GCS verbal subscore is 5. GCS motor subscore is 6.     Cranial Nerves: Cranial nerves 2-12 are intact.     Sensory: Sensation is intact.     Coordination: Coordination is intact.  Psychiatric:        Mood and Affect: Mood normal.        Behavior: Behavior normal.     ED Results and Treatments Labs (all labs ordered are listed, but only abnormal results are displayed) Labs Reviewed  CBC WITH DIFFERENTIAL/PLATELET - Abnormal; Notable for the following components:      Result Value   RBC 2.77 (*)    Hemoglobin 8.3 (*)    HCT 25.6 (*)    RDW 17.2 (*)    Platelets 134 (*)    All other components within normal limits  COMPREHENSIVE METABOLIC PANEL - Abnormal; Notable for the following components:   Sodium 124 (*)  Chloride 95 (*)    CO2 20 (*)    Glucose, Bld 111 (*)    Calcium 8.3 (*)    Total Protein 5.9 (*)    Albumin 3.0 (*)    AST 78 (*)    ALT 47 (*)    Total Bilirubin 3.8 (*)    All other components within normal limits  PROTIME-INR - Abnormal; Notable for the following components:   Prothrombin Time 19.5 (*)    INR 1.7 (*)    All other components within normal limits  AMMONIA - Abnormal; Notable for the following components:   Ammonia 101 (*)    All other components within normal limits  LACTIC ACID, PLASMA - Abnormal; Notable for the following components:   Lactic Acid, Venous 2.0 (*)    All other components within normal limits  MAGNESIUM - Abnormal; Notable for the following components:    Magnesium 1.6 (*)    All other components within normal limits  BODY FLUID CULTURE W GRAM STAIN  LIPASE, BLOOD  LACTIC ACID, PLASMA  ETHANOL  URINALYSIS, ROUTINE W REFLEX MICROSCOPIC  NA AND K (SODIUM & POTASSIUM), RAND UR  OSMOLALITY  OSMOLALITY, URINE  LACTATE DEHYDROGENASE, PLEURAL OR PERITONEAL FLUID  GLUCOSE, PLEURAL OR PERITONEAL FLUID  PROTEIN, PLEURAL OR PERITONEAL FLUID  ALBUMIN, PLEURAL OR PERITONEAL FLUID   BODY FLUID CELL COUNT WITH DIFFERENTIAL  LD, BODY FLUID (OTHER)  TYPE AND SCREEN                                                                                                                          Radiology CT ABDOMEN PELVIS W CONTRAST  Result Date: 02/13/2022 CLINICAL DATA:  Abdominal pain and distension with loss of appetite. Cirrhosis. Weakness, fatigue, and confusion. EXAM: CT ABDOMEN AND PELVIS WITH CONTRAST TECHNIQUE: Multidetector CT imaging of the abdomen and pelvis was performed using the standard protocol following bolus administration of intravenous contrast. RADIATION DOSE REDUCTION: This exam was performed according to the departmental dose-optimization program which includes automated exposure control, adjustment of the mA and/or kV according to patient size and/or use of iterative reconstruction technique. CONTRAST:  80mL OMNIPAQUE IOHEXOL 300 MG/ML  SOLN COMPARISON:  12/03/2021 FINDINGS: Lower chest: Mild scarring in the posterior basal segment right lower lobe. Hepatobiliary: Hepatic cirrhosis is present with some increase in geographic hypodensity throughout the liver probably reflecting a component of steatosis. This appears infiltrative rather than masslike. Gallbladder surgically absent.  No biliary dilatation. Pancreas: Unremarkable Spleen: Continued splenomegaly, heterogeneous splenic enhancement is probably contrast phase related and I am skeptical this appearance is related to splenic infarct. The spleen measures 17.1 by 9.1 by 20.7 cm (volume = 1700  cm^3). Adrenals/Urinary Tract: Unremarkable Stomach/Bowel: Possible wall thickening in the antrum of the stomach, cannot exclude antritis. Continued mild wall thickening in loops of duodenum and jejunum as well as the right colon, favoring portal enterocolopathy. Vascular/Lymphatic: Portal venous hypertension with recanalized umbilical vein and various other collaterals. The portal vein  and splenic vein currently appear patent. Reactive porta hepatis and peripancreatic lymph nodes. Reproductive: Uterus absent.  Adnexa unremarkable. Other: Moderate to large amount of ascites.  Mesenteric edema. Musculoskeletal: Upper abdominal midline ventral hernia containing adipose tissue, image 69 series 6. Small umbilical hernia contains ascites. Subcutaneous edema noted along the right flank and along the anterior abdominal wall inferiorly, and tracking along the lateral upper thigh region. IMPRESSION: 1. Hepatic cirrhosis with portal venous hypertension, splenomegaly, and moderate to large amount of ascites. Secondary signs of portal venous hypertension including recanalized umbilical vein. 2. Continued wall thickening in loops of duodenum, jejunum, and right colon, favoring portal enterocolopathy. 3. Possible wall thickening in the antrum of the stomach, cannot exclude antritis. 4. Upper abdominal midline ventral hernia containing adipose tissue. Small umbilical hernia contains ascites. 5. Subcutaneous edema along the right flank and anterior abdominal wall inferiorly, and tracking along the lateral upper thigh region. 6. Heterogeneous splenic enhancement is probably contrast phase related and I am skeptical this is related to splenic infarct. 7. Reactive porta hepatis and peripancreatic lymph nodes. Electronically Signed   By: Van Clines M.D.   On: 02/13/2022 18:47   DG Chest Portable 1 View  Result Date: 02/13/2022 CLINICAL DATA:  Abdominal pain and distention with loss of appetite. Weakness. EXAM: PORTABLE  CHEST 1 VIEW COMPARISON:  [facility] chest 12/18/2021 FINDINGS: Cardiac silhouette and mediastinal contours are within normal limits. The lungs are clear. No pleural effusion or pneumothorax. No acute skeletal abnormality. IMPRESSION: No active disease. Electronically Signed   By: Yvonne Kendall M.D.   On: 02/13/2022 17:07    Pertinent labs & imaging results that were available during my care of the patient were reviewed by me and considered in my medical decision making (see MDM for details).  Medications Ordered in ED Medications  cefTRIAXone (ROCEPHIN) 2 g in sodium chloride 0.9 % 100 mL IVPB (has no administration in time range)  lactulose (CHRONULAC) 10 GM/15ML solution 10 g (10 g Oral Given 02/13/22 1839)  sodium chloride 0.9 % bolus 500 mL (500 mLs Intravenous New Bag/Given 02/13/22 1839)  pantoprazole (PROTONIX) injection 40 mg (40 mg Intravenous Given 02/13/22 1839)  ondansetron (ZOFRAN) injection 4 mg (4 mg Intravenous Given 02/13/22 1839)  iohexol (OMNIPAQUE) 300 MG/ML solution 100 mL (80 mLs Intravenous Contrast Given 02/13/22 1829)                                                                                                                                     Procedures ABDOMINAL PARACENTESIS  Date/Time: 02/13/2022 7:57 PM  Performed by: Jeanell Sparrow, DO Authorized by: Jeanell Sparrow, DO  Consent: Verbal consent obtained. Risks and benefits: risks, benefits and alternatives were discussed Consent given by: patient and spouse Patient understanding: patient states understanding of the procedure being performed Test results: test results available and properly labeled Site marked: the operative site was marked Imaging studies: imaging studies  available Required items: required blood products, implants, devices, and special equipment available Patient identity confirmed: verbally with patient and arm band Time out: Immediately prior to procedure a "time out" was called to verify the  correct patient, procedure, equipment, support staff and site/side marked as required. Preparation: Patient was prepped and draped in the usual sterile fashion. Local anesthesia used: yes Anesthesia: local infiltration  Anesthesia: Local anesthesia used: yes Local Anesthetic: lidocaine 1% without epinephrine Anesthetic total: 5 mL  Sedation: Patient sedated: no  Patient tolerance: patient tolerated the procedure well with no immediate complications Comments: RLQ, clear yellow liquid removed, tolerated well. Sent for diagnostic testing.  Ultrasound guidance was used.      (including critical care time)  Medical Decision Making / ED Course   MDM:  AZLEE MONFORTE is a 42 y.o. 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  Bimonthly paracentesis.  Similar presentation 1/14 for which she was admitted and received therapeutic paracentesis. The complaint involves an extensive differential diagnosis and also carries with it a high risk of complications and morbidity.  Serious etiology was considered. Ddx includes but is not limited to: Differential diagnoses for altered mental status includes but is not exclusive to alcohol, illicit or prescription medications, intracranial pathology such as stroke, intracerebral hemorrhage, fever or infectious causes including sepsis, hypoxemia, uremia, trauma, endocrine related disorders such as diabetes, hypoglycemia, thyroid-related diseases, etc. Differential diagnosis includes but is not exclusive to ectopic pregnancy, ovarian cyst, ovarian torsion, acute appendicitis, urinary tract infection, endometriosis, bowel obstruction, hernia, colitis, renal colic, gastroenteritis, volvulus etc.  On initial assessment the patient is: She appears clinically dry on exam, intermittently  confused, neuroexam is nonfocal.  She has appeared jaundiced.  Abdomen is distended but nontender, nonperitoneal Vital signs and nursing notes were reviewed  Clinical Course as of 02/13/22 2011  Wed Feb 13, 2022  1754 Ammonia(!): 101 NH3 elevated, was 43 on discharge 2 days ago. Has not taken lactulose in last 2 days 2/2 stomach upset [SG]  1755 Sodium(!): 124 Chronic hyponatremia, same as prior [SG]  1954 Pt consented for paracentesis diag, was performed at bedside, see procedure note. Will give rocephin pending para results. Pt will need therapeutic paracentesis likely tomorrow.  [SG]    Clinical Course User Index [SG] Sloan Leiter, DO   CT imaging reviewed, large ascites, other findings noted in report CXR stable  Pt with chronic hyponatremia, also hepatic encephalopathy likely a/w her lactulose non-compliance. Dx paracentesis was also performed. Started on rocephin pending para results. She does not appear to be septic. Recommend admission, pt is agreeable.        Additional history obtained: -Additional history obtained from spouse -External records from outside source obtained and reviewed including: Chart review including previous notes, labs, imaging, consultation notes including prior admission, prior labs and imaging, home medications, prior ED visits   Lab Tests: -I ordered, reviewed, and interpreted labs.   The pertinent results include:   Labs Reviewed  CBC WITH DIFFERENTIAL/PLATELET - Abnormal; Notable for the following components:      Result Value   RBC 2.77 (*)    Hemoglobin 8.3 (*)    HCT 25.6 (*)    RDW 17.2 (*)    Platelets 134 (*)    All other components within normal limits  COMPREHENSIVE METABOLIC PANEL - Abnormal; Notable for  the following components:   Sodium 124 (*)    Chloride 95 (*)    CO2 20 (*)    Glucose, Bld 111 (*)    Calcium 8.3 (*)    Total Protein 5.9 (*)    Albumin 3.0 (*)    AST 78 (*)    ALT 47 (*)    Total Bilirubin 3.8 (*)     All other components within normal limits  PROTIME-INR - Abnormal; Notable for the following components:   Prothrombin Time 19.5 (*)    INR 1.7 (*)    All other components within normal limits  AMMONIA - Abnormal; Notable for the following components:   Ammonia 101 (*)    All other components within normal limits  LACTIC ACID, PLASMA - Abnormal; Notable for the following components:   Lactic Acid, Venous 2.0 (*)    All other components within normal limits  MAGNESIUM - Abnormal; Notable for the following components:   Magnesium 1.6 (*)    All other components within normal limits  BODY FLUID CULTURE W GRAM STAIN  LIPASE, BLOOD  LACTIC ACID, PLASMA  ETHANOL  URINALYSIS, ROUTINE W REFLEX MICROSCOPIC  NA AND K (SODIUM & POTASSIUM), RAND UR  OSMOLALITY  OSMOLALITY, URINE  LACTATE DEHYDROGENASE, PLEURAL OR PERITONEAL FLUID  GLUCOSE, PLEURAL OR PERITONEAL FLUID  PROTEIN, PLEURAL OR PERITONEAL FLUID  ALBUMIN, PLEURAL OR PERITONEAL FLUID   BODY FLUID CELL COUNT WITH DIFFERENTIAL  LD, BODY FLUID (OTHER)  TYPE AND SCREEN    Notable for as above  EKG   EKG Interpretation  Date/Time:  Wednesday February 13 2022 17:28:21 EST Ventricular Rate:  91 PR Interval:  139 QRS Duration: 78 QT Interval:  394 QTC Calculation: 485 R Axis:   25 Text Interpretation: Sinus rhythm Confirmed by Tanda Rockers ([id number]) on 02/13/2022 6:14:45 PM         Imaging Studies ordered: I ordered imaging studies including ctap, CXR I independently visualized the following imaging with scope of interpretation limited to determining acute life threatening conditions related to emergency care: ascites, portal encephalopathy, ?splenic abnormality I independently visualized and interpreted imaging. I agree with the radiologist interpretation   Medicines ordered and prescription drug management: Meds ordered this encounter  Medications   lactulose (CHRONULAC) 10 GM/15ML solution 10 g   sodium chloride 0.9 %  bolus 500 mL   pantoprazole (PROTONIX) injection 40 mg   ondansetron (ZOFRAN) injection 4 mg   iohexol (OMNIPAQUE) 300 MG/ML solution 100 mL   cefTRIAXone (ROCEPHIN) 2 g in sodium chloride 0.9 % 100 mL IVPB    Order Specific Question:   Antibiotic Indication:    Answer:   Intra-abdominal    Order Specific Question:   Other Indication:    Answer:   ?sbp    -I have reviewed the patients home medicines and have made adjustments as needed   Consultations Obtained: I requested consultation with the na,  and discussed lab and imaging findings as well as pertinent plan - they recommend: na   Cardiac Monitoring: The patient was maintained on a cardiac monitor.  I personally viewed and interpreted the cardiac monitored which showed an underlying rhythm of: NSR  Social Determinants of Health:  Diagnosis or treatment significantly limited by social determinants of health: current smoker Counseled patient for approximately 3 minutes regarding smoking cessation. Discussed risks of smoking and how they applied and affected their visit here today. Patient not ready to quit at this time, however will follow up with their primary  doctor when they are.   CPT code: [phone]: intermediate counseling for smoking cessation     Reevaluation: After the interventions noted above, I reevaluated the patient and found that they have improved  Co morbidities that complicate the patient evaluation  Past Medical History:  Diagnosis Date   Anemia    Cirrhosis ([place])    DVT (deep venous thrombosis) (HCC)    Low iron    Neuropathy    Neuropathy    Panic attacks    Pulmonary embolus ([place]) 2019      Dispostion: Disposition decision including need for hospitalization was considered, and patient admitted to the hospital.    Final Clinical Impression(s) / ED Diagnoses Final diagnoses:  Hepatic encephalopathy ([place])  Hyponatremia  Generalized abdominal pain     This chart was dictated using voice  recognition software.  Despite best efforts to proofread,  errors can occur which can change the documentation meaning.    Jeanell Sparrow, DO 02/13/22 2012

## 2022-02-13 NOTE — ED Triage Notes (Signed)
Pt reports abdominal pain, distention with loss of appetite. Pt has chronic liver problems. She c/o weakness, fatigue and confusion. Pt states there stomach is leaking from her previous abdominocentesis.

## 2022-02-13 NOTE — Progress Notes (Signed)
   Portal Hypertension Clinic Screening Evaluation   Indication for evaluation: Erica Banks is a 42 y.o. female undergoing preliminary evaluation in the [facility] Portal Hypertension Clinic due to recurrent ascites.  Referring Physician/Established Gastroenterologist:  Dr. Gala Romney  Etiology of cirrhosis: EtOH Initially diagnosed: March 2023 # of paracentesis in last month: 2 # of paracentesis in last 2 months: 4 History of hepatic hydrothorax:  No History of hepatic encephalopathy: No  Prior evaluation for liver transplant: Yes - not a candidate due to low MELD History of hepatocellular carcinoma: No  Prior esophagogastroduodenoscopy/intervention: Yes (12/22/21 Jenetta Downer): grade 2 esophageal varices, no intervention Current esophageal varices: Yes Current gastric varices: No History of hematemesis: No  Current diuretic regimen: furosemide 40 mg QD, spironolactone 100 mg QD Current pharmacologic encephalopathy prophylaxis/treatment: none  History of renal dysfunction: no History of hemodialysis: no  History of cardiac dysfunction: no  Other pertinent past medical history: Lower GI bleed, anemia, DVT + PE, neuropathy   Imaging: Prior cross sectional imaging of portal system: CT [facility] 12/03/21  Cirrhosis, patent portal system, small esophageal varices, no significant splenorenal shunt, small recanalized paraumbilical vein.  Echocardiogram: None   Labs: 02/11/22 Creatinine: 0.83 Total Bilirubin: 5.0 INR: 1.7 Sodium: 124 Albumin: 2.8  Child-Pugh = 11 points, class C MELD = 18 (6.0% estimated 3 month mortality) [place] Index of Post-TIPS Survival (FIPS) = -0.01 (overall survival estimated at 1 month 95.5%, 3 months 85.6%, and 6 months 79.1%)    Assessment: Erica Banks is a 42 y.o. female with history of alcoholic cirrhosis (Child Pugh C, MELD 18) with recurrent ascites.  After preliminary evaluation, this patient would be a  favorably marginal candidate for TIPS creation, with some caution due to hyperbilirubinemia.  She does have a small recanalized paraumbilical vein on November CT, which in the interim could have enlarged contributing to "shunt syndrome" and worsened bilirubin levels.  She is on moderate dose diuretics currently.   Recommendation: Formal consult for TIPS creation could be considered if maximization of diuretics is not possible. Repeat CTA abdomen/pelvis (BRTO protocol) could be considered for further evaluation of recent increased bilirubin, which at this point would be strongest relative contraindication for TIPS creation. The patient's Gastroenterologist, Dr. Gala Romney, will be contacted for further discussion.    Electronically Signed: Suzette Battiest, MD 02/13/2022, 9:38 AM

## 2022-02-14 ENCOUNTER — Observation Stay (HOSPITAL_COMMUNITY): Payer: Medicaid Other

## 2022-02-14 ENCOUNTER — Ambulatory Visit: Payer: Medicaid Other | Admitting: Gastroenterology

## 2022-02-14 ENCOUNTER — Encounter (HOSPITAL_COMMUNITY): Payer: Self-pay | Admitting: Family Medicine

## 2022-02-14 DIAGNOSIS — Z825 Family history of asthma and other chronic lower respiratory diseases: Secondary | ICD-10-CM | POA: Diagnosis not present

## 2022-02-14 DIAGNOSIS — Z86711 Personal history of pulmonary embolism: Secondary | ICD-10-CM | POA: Diagnosis not present

## 2022-02-14 DIAGNOSIS — Z86718 Personal history of other venous thrombosis and embolism: Secondary | ICD-10-CM | POA: Diagnosis not present

## 2022-02-14 DIAGNOSIS — Z79899 Other long term (current) drug therapy: Secondary | ICD-10-CM | POA: Diagnosis not present

## 2022-02-14 DIAGNOSIS — K7682 Hepatic encephalopathy: Secondary | ICD-10-CM

## 2022-02-14 DIAGNOSIS — E871 Hypo-osmolality and hyponatremia: Secondary | ICD-10-CM | POA: Diagnosis present

## 2022-02-14 DIAGNOSIS — Z8489 Family history of other specified conditions: Secondary | ICD-10-CM | POA: Diagnosis not present

## 2022-02-14 DIAGNOSIS — R109 Unspecified abdominal pain: Secondary | ICD-10-CM | POA: Diagnosis not present

## 2022-02-14 DIAGNOSIS — E876 Hypokalemia: Secondary | ICD-10-CM | POA: Diagnosis present

## 2022-02-14 DIAGNOSIS — Z8379 Family history of other diseases of the digestive system: Secondary | ICD-10-CM | POA: Diagnosis not present

## 2022-02-14 DIAGNOSIS — E8809 Other disorders of plasma-protein metabolism, not elsewhere classified: Secondary | ICD-10-CM | POA: Diagnosis present

## 2022-02-14 DIAGNOSIS — F17210 Nicotine dependence, cigarettes, uncomplicated: Secondary | ICD-10-CM | POA: Diagnosis present

## 2022-02-14 DIAGNOSIS — K7031 Alcoholic cirrhosis of liver with ascites: Secondary | ICD-10-CM

## 2022-02-14 DIAGNOSIS — Z91148 Patient's other noncompliance with medication regimen for other reason: Secondary | ICD-10-CM | POA: Diagnosis not present

## 2022-02-14 DIAGNOSIS — D649 Anemia, unspecified: Secondary | ICD-10-CM | POA: Diagnosis present

## 2022-02-14 DIAGNOSIS — Z885 Allergy status to narcotic agent status: Secondary | ICD-10-CM | POA: Diagnosis not present

## 2022-02-14 DIAGNOSIS — Z833 Family history of diabetes mellitus: Secondary | ICD-10-CM | POA: Diagnosis not present

## 2022-02-14 DIAGNOSIS — Z8249 Family history of ischemic heart disease and other diseases of the circulatory system: Secondary | ICD-10-CM | POA: Diagnosis not present

## 2022-02-14 DIAGNOSIS — G629 Polyneuropathy, unspecified: Secondary | ICD-10-CM | POA: Diagnosis present

## 2022-02-14 DIAGNOSIS — R7401 Elevation of levels of liver transaminase levels: Secondary | ICD-10-CM | POA: Insufficient documentation

## 2022-02-14 LAB — PROTEIN, PLEURAL OR PERITONEAL FLUID: Total protein, fluid: <3 g/dL

## 2022-02-14 LAB — OSMOLALITY: Osmolality: 266 mOsm/kg — ABNORMAL LOW (ref 275–295)

## 2022-02-14 LAB — CBC WITH DIFFERENTIAL/PLATELET
Abs Immature Granulocytes: 0.02 10*3/uL (ref 0.00–0.07)
Basophils Absolute: 0.1 10*3/uL (ref 0.0–0.1)
Basophils Relative: 1 %
Eosinophils Absolute: 0.1 10*3/uL (ref 0.0–0.5)
Eosinophils Relative: 2 %
HCT: 22.8 % — ABNORMAL LOW (ref 36.0–46.0)
Hemoglobin: 7.2 g/dL — ABNORMAL LOW (ref 12.0–15.0)
Immature Granulocytes: 0 %
Lymphocytes Relative: 45 %
Lymphs Abs: 2.4 10*3/uL (ref 0.7–4.0)
MCH: 29.4 pg (ref 26.0–34.0)
MCHC: 31.6 g/dL (ref 30.0–36.0)
MCV: 93.1 fL (ref 80.0–100.0)
Monocytes Absolute: 0.6 10*3/uL (ref 0.1–1.0)
Monocytes Relative: 10 %
Neutro Abs: 2.4 10*3/uL (ref 1.7–7.7)
Neutrophils Relative %: 42 %
Platelets: 122 10*3/uL — ABNORMAL LOW (ref 150–400)
RBC: 2.45 MIL/uL — ABNORMAL LOW (ref 3.87–5.11)
RDW: 17.1 % — ABNORMAL HIGH (ref 11.5–15.5)
WBC: 5.5 10*3/uL (ref 4.0–10.5)
nRBC: 0 % (ref 0.0–0.2)

## 2022-02-14 LAB — COMPREHENSIVE METABOLIC PANEL
ALT: 40 U/L (ref 0–44)
AST: 62 U/L — ABNORMAL HIGH (ref 15–41)
Albumin: 2.5 g/dL — ABNORMAL LOW (ref 3.5–5.0)
Alkaline Phosphatase: 89 U/L (ref 38–126)
Anion gap: 8 (ref 5–15)
BUN: 14 mg/dL (ref 6–20)
CO2: 21 mmol/L — ABNORMAL LOW (ref 22–32)
Calcium: 8 mg/dL — ABNORMAL LOW (ref 8.9–10.3)
Chloride: 100 mmol/L (ref 98–111)
Creatinine, Ser: 0.97 mg/dL (ref 0.44–1.00)
GFR, Estimated: >60 mL/min (ref >60)
Glucose, Bld: 115 mg/dL — ABNORMAL HIGH (ref 70–99)
Potassium: 4.4 mmol/L (ref 3.5–5.1)
Sodium: 129 mmol/L — ABNORMAL LOW (ref 135–145)
Total Bilirubin: 2.6 mg/dL — ABNORMAL HIGH (ref 0.3–1.2)
Total Protein: 4.8 g/dL — ABNORMAL LOW (ref 6.5–8.1)

## 2022-02-14 LAB — ALBUMIN, PLEURAL OR PERITONEAL FLUID: Albumin, Fluid: <1.5 g/dL

## 2022-02-14 LAB — BODY FLUID CELL COUNT WITH DIFFERENTIAL
Eos, Fluid: 0 %
Lymphs, Fluid: 25 %
Monocyte-Macrophage-Serous Fluid: 26 % — ABNORMAL LOW (ref 50–90)
Neutrophil Count, Fluid: 49 % — ABNORMAL HIGH (ref 0–25)
Total Nucleated Cell Count, Fluid: 171 cu mm (ref 0–1000)

## 2022-02-14 LAB — AMMONIA: Ammonia: 60 umol/L — ABNORMAL HIGH (ref 9–35)

## 2022-02-14 LAB — LACTATE DEHYDROGENASE: LDH: 122 U/L (ref 98–192)

## 2022-02-14 LAB — LACTATE DEHYDROGENASE, PLEURAL OR PERITONEAL FLUID: LD, Fluid: <25 U/L — ABNORMAL HIGH (ref 3–23)

## 2022-02-14 LAB — GRAM STAIN

## 2022-02-14 LAB — MAGNESIUM: Magnesium: 2 mg/dL (ref 1.7–2.4)

## 2022-02-14 LAB — OSMOLALITY, URINE: Osmolality, Ur: 333 mOsm/kg (ref 300–900)

## 2022-02-14 MED ORDER — LACTULOSE 10 GM/15ML PO SOLN
20.0000 g | Freq: Three times a day (TID) | ORAL | Status: DC
  Administered 2022-02-14 – 2022-02-16 (×7): 20 g via ORAL
  Filled 2022-02-14 (×7): qty 30

## 2022-02-14 MED ORDER — METOCLOPRAMIDE HCL 10 MG PO TABS: 5.0000 mg | ORAL_TABLET | Freq: Two times a day (BID) | ORAL | Status: DC | PRN

## 2022-02-14 MED ORDER — MELATONIN 3 MG PO TABS
6.0000 mg | ORAL_TABLET | Freq: Every day | ORAL | Status: DC
  Administered 2022-02-14 – 2022-02-15 (×2): 6 mg via ORAL
  Filled 2022-02-14 (×2): qty 2

## 2022-02-14 NOTE — Assessment & Plan Note (Signed)
-  Confusion and fatigue - Ammonia level over 100 - Noncompliance with lactulose since last discharge - 10 g of lactulose given in the ED - Trend ammonia in the a.m., anticipation that it will still be high and will require another dose of lactulose - Anticipate improved mentation with correction of ammonia level - Secondary to alcoholic cirrhosis

## 2022-02-14 NOTE — Plan of Care (Signed)

## 2022-02-14 NOTE — Assessment & Plan Note (Signed)
-  Magnesium 1.6 - 2 g mag sulfate administered - Trend in the a.m.

## 2022-02-14 NOTE — Progress Notes (Signed)
Pt had periods of forgetfulness throughout night. Paracentesis sites having been draining and ABD were placed. Pt seems more alert this AM. No acute events overnight. Bryson Corona Edd Fabian

## 2022-02-14 NOTE — Assessment & Plan Note (Signed)
-  Albumin 3.0 - Secondary to poor p.o. intake and alcoholic cirrhosis - Encourage nutrient dense food intake

## 2022-02-14 NOTE — H&P (Signed)
History and Physical    Patient: Erica Banks ENI:[id number] DOB: 09-25-1980 DOA: 02/13/2022 DOS: the patient was seen and examined on 02/14/2022 PCP: Neale Burly, MD  Patient coming from: Home  Chief Complaint:  Chief Complaint  Patient presents with   Abdominal Pain   Fatigue    Abdominal distention,    HPI: Erica Banks is a 42 y.o. female with medical history significant of anemia, alcoholic cirrhosis, neuropathy, history of DVT/PE, and more presents ED with a chief complaint of not feeling well.  Patient has had fatigue and malaise, and has been a poor historian for both admissions.  I recently admitted her 3 days ago, but she seems more confused this time.  Is likely due to the fact that she had a couple of opiate medications prior to my interview with her.  Spouse was present when she was examined by the ER provider.  Spouse had reported that she had intermittent confusion, nausea, vomiting, diarrhea over the past 2 to 3 days.  She had not taken her lactulose in the past 2 days secondary to abdominal discomfort with diarrhea.  When I talked to patient she just tells me she feels terrible.  When asked her what feels terrible she cannot describe it.  She drifts back into sleep.  She told me she needed a sip of water about 3 times, each time was showing her that her water was right next to her.  Ultimately she cannot give me any usable history. Last paracentesis was January 15th. Will keep full CODE STATUS as patient did confirm that at the last admission. Review of Systems: unable to review all systems due to the inability of the patient to answer questions. Past Medical History:  Diagnosis Date   Anemia    Cirrhosis ([place])    DVT (deep venous thrombosis) (HCC)    Low iron    Neuropathy    Neuropathy    Panic attacks    Pulmonary embolus ([place]) 2019   Past Surgical History:  Procedure Laterality Date   ABDOMINAL HYSTERECTOMY     BIOPSY N/A 06/24/2013   Procedure:  BIOPSY TERMINAL ILEUM;  Surgeon: Daneil Dolin, MD;  Location: [facility];  Service: Endoscopy;  Laterality: N/A;   CHOLECYSTECTOMY     COLONOSCOPY N/A 06/24/2013   IRW:[id number] hemorrhoids/otherwise normal    COLONOSCOPY WITH PROPOFOL N/A 05/31/2021   Surgeon: Daneil Dolin, MD; ery large nonbleeding internal and external hemorrhoids, portal colopathy.  Recommended repeat colonoscopy in 10 years.   ESOPHAGOGASTRODUODENOSCOPY (EGD) WITH PROPOFOL N/A 03/29/2021   Surgeon: Eloise Harman, DO;  grade 1 esophageal varices, portal hypertensive gastropathy with friable tissue with spontaneous ooze, normal duodenum.   ESOPHAGOGASTRODUODENOSCOPY (EGD) WITH PROPOFOL N/A 05/31/2021   Surgeon: Daneil Dolin, MD;  grade 1-2 esophageal varices without bleeding stigmata, advanced appearing portal hypertensive gastropathy.   ESOPHAGOGASTRODUODENOSCOPY (EGD) WITH PROPOFOL N/A 12/22/2021   Procedure: ESOPHAGOGASTRODUODENOSCOPY (EGD) WITH PROPOFOL;  Surgeon: Harvel Quale, MD;  Location: [facility];  Service: Gastroenterology;  Laterality: N/A;   nasal bone surgery     NASAL SINUS SURGERY     SINUS IRRIGATION     TONSILLECTOMY     TUBAL LIGATION     Social History:  reports that she has been smoking cigarettes. She has a 6.00 pack-year smoking history. She has never used smokeless tobacco. She reports that she does not currently use alcohol after a past usage of about 4.0 - 5.0 standard drinks of  alcohol per week. She reports current drug use. Drug: Marijuana.  Allergies  Allergen Reactions   Demerol Anaphylaxis    Per patient cardiac arrest    Family History  Problem Relation Age of Onset   Diabetes Mother    Hypertension Mother    COPD Mother    Cirrhosis Father        ETOH   Diabetes Sister    Hypertension Sister    Crohn's disease Maternal Grandmother    Cancer Other    Colon cancer Neg Hx    Autoimmune disease Neg Hx     Prior to Admission medications    Medication Sig Start Date End Date Taking? Authorizing Provider  CONSTULOSE 10 GM/15ML solution Take 30 mLs (20 g total) by mouth 3 (three) times daily. 12/22/21  Yes Kathie Dike, MD  furosemide (LASIX) 40 MG tablet TAKE 1 TABLET BY MOUTH DAILY 01/02/22  Yes Annitta Needs, NP  gabapentin (NEURONTIN) 300 MG capsule Take 300 mg by mouth 3 (three) times daily as needed (nerve pain). 02/25/21  Yes [provider]  hydrOXYzine (VISTARIL) 25 MG capsule Take 25 mg by mouth 3 (three) times daily as needed for anxiety. 11/13/21  Yes [provider]  methocarbamol (ROBAXIN) 500 MG tablet Take 500 mg by mouth at bedtime as needed for muscle spasms. 11/29/21  Yes [provider]  metoCLOPramide (REGLAN) 5 MG tablet Take 5 mg by mouth 2 (two) times daily as needed. 01/14/22  Yes [provider]  ondansetron (ZOFRAN-ODT) 4 MG disintegrating tablet Take 1 tablet (4 mg total) by mouth every 8 (eight) hours as needed for nausea or vomiting. 12/03/21  Yes Godfrey Pick, MD  pantoprazole (PROTONIX) 40 MG tablet Take 1 tablet (40 mg total) by mouth 2 (two) times daily before a meal. Patient taking differently: Take 40 mg by mouth daily. 03/30/21  Yes Kathie Dike, MD  spironolactone (ALDACTONE) 100 MG tablet TAKE 1 TABLET BY MOUTH DAILY 01/02/22  Yes Annitta Needs, NP    Physical Exam: Vitals:   02/13/22 2200 02/13/22 2230 02/13/22 2245 02/14/22 0245  BP: 125/69 107/73 107/66 110/64  Pulse: 96 94 86   Resp: 14 11 20 16   Temp:   98.4 F (36.9 C) 97.7 F (36.5 C)  TempSrc:   Oral Oral  SpO2: 98% 100% 100% 100%  Weight:   70.9 kg   Height:       1.  General: Patient lying supine in bed,  no acute distress   2. Psychiatric: Alert and oriented x 3, mood and behavior normal for situation, pleasant and cooperative with exam   3. Neurologic: Speech and language are normal, face is symmetric, moves all 4 extremities voluntarily, at baseline without acute deficits on limited  exam   4. HEENMT:  Head is atraumatic, normocephalic, pupils reactive to light, neck is supple, trachea is midline, mucous membranes are moist   5. Respiratory : Lungs are clear to auscultation bilaterally without wheezing, rhonchi, rales, no cyanosis, no increase in work of breathing or accessory muscle use   6. Cardiovascular : Heart rate normal, rhythm is regular, murmur present, but no rubs or gallops, no peripheral edema, peripheral pulses palpated   7. Gastrointestinal:  Ventral hernia, abdomen is soft, distended with fluid, nontender to palpation bowel sounds active, no masses or organomegaly palpated   8. Skin:  Skin is warm, dry and intact without rashes, acute lesions, or ulcers on limited exam   9.Musculoskeletal:  No acute deformities  or trauma, no asymmetry in tone, no peripheral edema, peripheral pulses palpated, no tenderness to palpation in the extremities  Data Reviewed: In the ED Temp 98.3, heart rate 93, respiratory rate 14-18, blood pressure 114/6-114/70, satting at 100% No leukocytosis with white blood cell count of 5.9, hemoglobin stable at 8.3-she did have a transfusion at the last admission which to occur hemoglobin up to 8.4  Chemistry reveals a hyponatremia 124 which is improved from last presentation at which time it was 122, it has been stable since her discharge Patient has a decreased bicarb at 20 Magnesium low at 1.6 EKG shows a heart rate of 91, sinus rhythm, QTc 485 Admission requested for hepatic encephalopathy  Assessment and Plan: * Hepatic encephalopathy (HCC) - Confusion and fatigue - Ammonia level over 100 - Noncompliance with lactulose since last discharge - 10 g of lactulose given in the ED - Trend ammonia in the a.m., anticipation that it will still be high and will require another dose of lactulose - Anticipate improved mentation with correction of ammonia level - Secondary to alcoholic cirrhosis  Abdominal pain - Continue abdominal  pain with concerns for SBP - Diagnostic paracentesis done in the ED with 500 mL output - Covered with Rocephin while awaiting analysis  Transaminitis - Stable from last admission with an AST of 78 and ALT 47 - Continue to monitor  Hypomagnesemia - Magnesium 1.6 - 2 g mag sulfate administered - Trend in the a.m.  Ascites due to alcoholic cirrhosis (HCC) - Last paracentesis was at last admission a few days ago at 4.4 L output - Ultrasound in the a.m. with plan for paracentesis - Patient is no longer drinking - Her plan is to be evaluated for TIPS procedure - Continue to monitor  Hypoalbuminemia - Albumin 3.0 - Secondary to poor p.o. intake and alcoholic cirrhosis - Encourage nutrient dense food intake      Advance Care Planning:   Code Status: Full Code  Consults: None at this time  Family Communication: No family at bedside  Severity of Illness: The appropriate patient status for this patient is OBSERVATION. Observation status is judged to be reasonable and necessary in order to provide the required intensity of service to ensure the patient's safety. The patient's presenting symptoms, physical exam findings, and initial radiographic and laboratory data in the context of their medical condition is felt to place them at decreased risk for further clinical deterioration. Furthermore, it is anticipated that the patient will be medically stable for discharge from the hospital within 2 midnights of admission.   Author: Lilyan Gilford, DO 02/14/2022 2:52 AM  For on call review [email or web address].

## 2022-02-14 NOTE — Assessment & Plan Note (Signed)
-  Stable from last admission with an AST of 78 and ALT 47 - Continue to monitor

## 2022-02-14 NOTE — TOC Initial Note (Signed)
Transition of Care [facility]) - Initial/Assessment Note    Patient Details  Name: Erica Banks MRN: [id number] Date of Birth: Dec 25, 1980  [facility] CM/SW Contact:    Iona Beard, [place] Phone Number: 02/14/2022, 2:12 PM  Clinical Narrative:                 Pt is high risk for readmission. Pt recently assessed by TOC. Pt lives with her spouse. Pt is independent in completing ADLs and has transportation provided when needed. Pt has not had HH and does not need any DME. TOC to follow for D/C needs.   Expected Discharge Plan: Home/Self Care Barriers to Discharge: Continued Medical Work up   Patient Goals and CMS Choice Patient states their goals for this hospitalization and ongoing recovery are:: return home          Expected Discharge Plan and Services In-house Referral: Clinical Social Work Discharge Planning Services: CM Consult   Living arrangements for the past 2 months: Single Family Home                                      Prior Living Arrangements/Services Living arrangements for the past 2 months: Single Family Home Lives with:: Spouse Patient language and need for interpreter reviewed:: Yes Do you feel safe going back to the place where you live?: Yes      Need for Family Participation in Patient Care: Yes (Comment) Care giver support system in place?: Yes (comment)   Criminal Activity/Legal Involvement Pertinent to Current Situation/Hospitalization: No - Comment as needed  Activities of Daily Living Home Assistive Devices/Equipment: None ADL Screening (condition at time of admission) Patient's cognitive ability adequate to safely complete daily activities?: No (pt is confused) Is the patient deaf or have difficulty hearing?: No Does the patient have difficulty seeing, even when wearing glasses/contacts?: No Does the patient have difficulty concentrating, remembering, or making decisions?: Yes Patient able to express need for  assistance with ADLs?: Yes Does the patient have difficulty dressing or bathing?: No Independently performs ADLs?: Yes (appropriate for developmental age) Does the patient have difficulty walking or climbing stairs?: No Weakness of Legs: Both Weakness of Arms/Hands: Both  Permission Sought/Granted                  Emotional Assessment Appearance:: Appears stated age Attitude/Demeanor/Rapport: Engaged Affect (typically observed): Accepting Orientation: : Oriented to Self, Oriented to Place, Oriented to  Time, Oriented to Situation Alcohol / Substance Use: Not Applicable Psych Involvement: No (comment)  Admission diagnosis:  Hepatic encephalopathy ([place]) [K76.82] Hyponatremia [E87.1] Generalized abdominal pain [R10.84] Patient Active Problem List   Diagnosis Date Noted   Transaminitis 02/14/2022   Abdominal pain 02/14/2022   Hepatic encephalopathy ([place]) 02/10/2022   Hemorrhoids 02/10/2022   Generalized weakness 12/19/2021   AKI (acute kidney injury) ([place]) 12/19/2021   Hyperammonemia ([place]) 12/19/2021   Hypomagnesemia 12/19/2021   Ascites due to alcoholic cirrhosis ([place]) 25/85/2778   Hypocalcemia 11/23/2021   Hypoalbuminemia 11/23/2021   Tobacco use disorder 11/23/2021   Esophageal varices without bleeding ([place]) 10/31/2021   Heartburn 08/23/2021   Nausea without vomiting 08/23/2021   Lesion of liver less than 1 cm in diameter    Acute blood loss anemia 24/23/5361   Alcoholic cirrhosis of liver with ascites ([place]) 03/27/2021   Hyponatremia 03/27/2021   Iron deficiency anemia 03/27/2021   Folate deficiency 03/27/2021  Ascites    Cirrhosis of liver with ascites (HCC)    Intractable nausea and vomiting 07/20/2019   Cholelithiasis 07/20/2019   Prolonged QT interval 07/20/2019   Anemia 01/01/2015   Symptomatic anemia 01/01/2015   Hypokalemia 01/01/2015   Acute gastroenteritis 01/01/2015   Abnormal CT of the abdomen 06/08/2013   Gastroesophageal reflux disease  06/08/2013   PCP:  Neale Burly, MD Pharmacy:   [facility] [place] [address] [place] [address] Phone: [phone] Fax: [phone]     Social Determinants of Health (SDOH) Social History: [place]: No Food Insecurity (02/13/2022)  Housing: Low Risk  (02/13/2022)  Transportation Needs: No Transportation Needs (02/13/2022)  Utilities: Not At Risk (02/13/2022)  Tobacco Use: High Risk (02/14/2022)   SDOH Interventions:     Readmission Risk Interventions    02/14/2022    2:11 PM 12/19/2021    8:54 AM  Readmission Risk Prevention Plan  Transportation Screening Complete Complete  HRI or Home Care Consult Complete Complete  Social Work Consult for [place] Planning/Counseling Complete Complete  Palliative Care Screening Not Applicable Not Applicable  Medication Review Press photographer) Complete Complete

## 2022-02-14 NOTE — Progress Notes (Signed)
Patient tolerated left sided paracentesis well today and 5 Liters of clear yellow ascites removed. PT verbalized understanding of post procedure instructions and returned to inpatient bed assignment via stretcher at this time with no acute distress order.

## 2022-02-14 NOTE — Assessment & Plan Note (Signed)
-  Last paracentesis was at last admission a few days ago at 4.4 L output - Ultrasound in the a.m. with plan for paracentesis - Patient is no longer drinking - Her plan is to be evaluated for TIPS procedure - Continue to monitor

## 2022-02-14 NOTE — Procedures (Signed)
   Cirrhosis Recurrent ascites  US guided LLQ paracentesis 5 L yellow fluid Sent for labs per MD  Tolerated well  EBL: scant

## 2022-02-14 NOTE — Assessment & Plan Note (Signed)
-  Continue abdominal pain with concerns for SBP - Diagnostic paracentesis done in the ED with 500 mL output - Covered with Rocephin while awaiting analysis

## 2022-02-14 NOTE — Progress Notes (Signed)
PROGRESS NOTE    Erica Banks  ZOX:[id number] DOB: 04/29/1980 DOA: 02/13/2022 PCP: Toma Deiters, MD   Brief Narrative:  Erica Banks is a 42 y.o. female with medical history significant of anemia, alcoholic cirrhosis, neuropathy, history of DVT/PE, and more presents ED with a chief complaint of not feeling well.  Patient has had fatigue and malaise, and has been a poor historian for both admissions.  I recently admitted her 3 days ago, but she seems more confused this time.  Is likely due to the fact that she had a couple of opiate medications prior to my interview with her.  Spouse was present when she was examined by the ER provider.  Spouse had reported that she had intermittent confusion, nausea, vomiting, diarrhea over the past 2 to 3 days.  She had not taken her lactulose in the past 2 days secondary to abdominal discomfort with diarrhea.  When I talked to patient she just tells me she feels terrible.  When asked her what feels terrible she cannot describe it.  She drifts back into sleep.  She told me she needed a sip of water about 3 times, each time was showing her that her water was right next to her.  Ultimately she cannot give me any usable history. Last paracentesis was January 15th.  Assessment & Plan:   Principal Problem:   Hepatic encephalopathy (HCC) Active Problems:   Hypoalbuminemia   Ascites due to alcoholic cirrhosis (HCC)   Hypomagnesemia   Transaminitis   Abdominal pain   Hepatic encephalopathy (HCC) Ammonia level over 100 upon admission, patient has not had any bowel movement but despite of that her ammonia has improved to 60 and she is fully alert and oriented at the moment.  She says that she is not noncompliant but she was nauseous that is why she could not take any of her medications including lactulose. She received 1 dose of lactulose yesterday, will resume home dose.   Abdominal pain Although she did complain of abdominal pain but on examination, she  has no tenderness, she has positive fluid shift and severe abdominal distention due to ascites but with no tenderness, I doubt SBP however we will continue Rocephin and will wait for the fluid analysis.  Paracentesis is ordered.   Transaminitis - Stable from last admission with an AST of 78 and ALT 47 - Continue to monitor   Hypomagnesemia Replaced and resolved.  Ascites due to alcoholic cirrhosis (HCC) - Last paracentesis was at last admission a few days ago at 4.4 L output Paracentesis ordered.   Hypoalbuminemia - Albumin 3.0 - Secondary to poor p.o. intake and alcoholic cirrhosis - Encourage nutrient dense food intake, will order albumin after paracentesis.    DVT prophylaxis: heparin injection 5,000 Units Start: 02/13/22 2200 SCDs Start: 02/13/22 2153   Code Status: Full Code  Family Communication:  None present at bedside.  Plan of care discussed with patient in length and he/she verbalized understanding and agreed with it.  Status is: Observation The patient will require care spanning > 2 midnights and should be moved to inpatient because: Patient needs paracentesis and since this is recurrent hospitalization within 3 days, we will observe her little longer at this time.   Estimated body mass index is 24.76 kg/m as calculated from the following:   Height as of this encounter: 5\' 6"  (1.676 m).   Weight as of this encounter: 69.6 kg.    Nutritional Assessment: Body mass index is 24.76 kg/m.  Seen by dietician.  I agree with the assessment and plan as outlined below: Nutrition Status:        . Skin Assessment: I have examined the patient's skin and I agree with the wound assessment as performed by the wound care RN as outlined below:    Consultants:  None  Procedures:  As above  Antimicrobials:  Anti-infectives (From admission, onward)    Start     Dose/Rate Route Frequency Ordered Stop   02/14/22 2100  cefTRIAXone (ROCEPHIN) 1 g in sodium chloride 0.9 %  100 mL IVPB  Status:  Discontinued        1 g 200 mL/hr over 30 Minutes Intravenous Every 24 hours 02/13/22 2152 02/13/22 2203   02/14/22 2000  cefTRIAXone (ROCEPHIN) 2 g in sodium chloride 0.9 % 100 mL IVPB        2 g 200 mL/hr over 30 Minutes Intravenous Every 24 hours 02/13/22 2203     02/13/22 2000  cefTRIAXone (ROCEPHIN) 2 g in sodium chloride 0.9 % 100 mL IVPB        2 g 200 mL/hr over 30 Minutes Intravenous  Once 02/13/22 1955 02/13/22 2105         Subjective: Patient seen and examined, although she says that she feels confused but she is fully alert and oriented, she complained of abdominal pain but has no abdominal tenderness and appears comfortable.  Objective: Vitals:   02/13/22 2245 02/14/22 0245 02/14/22 0641 02/14/22 0645  BP: 107/66 110/64  119/69  Pulse: 86     Resp: 20 16  15   Temp: 98.4 F (36.9 C) 97.7 F (36.5 C)  97.7 F (36.5 C)  TempSrc: Oral Oral  Oral  SpO2: 100% 100%  100%  Weight: 70.9 kg  69.6 kg   Height:        Intake/Output Summary (Last 24 hours) at 02/14/2022 0937 Last data filed at 02/14/2022 0400 Gross per 24 hour  Intake 840 ml  Output --  Net 840 ml   Filed Weights   02/13/22 1559 02/13/22 2245 02/14/22 0641  Weight: 67.1 kg 70.9 kg 69.6 kg    Examination:  General exam: Appears calm and comfortable  Respiratory system: Clear to auscultation. Respiratory effort normal. Cardiovascular system: S1 & S2 heard, RRR. No JVD, murmurs, rubs, gallops or clicks. No pedal edema. Gastrointestinal system: Abdomen is distended, soft and nontender. No organomegaly or masses felt. Normal bowel sounds heard. Central nervous system: Alert and oriented. No focal neurological deficits. Extremities: Symmetric 5 x 5 power. Skin: No rashes, lesions or ulcers Psychiatry: Judgement and insight appear normal. Mood & affect appropriate.    Data Reviewed: I have personally reviewed following labs and imaging studies  CBC: Recent Labs  Lab  02/10/22 1723 02/11/22 0558 02/13/22 1636 02/14/22 0415  WBC 9.3 8.2 5.9 5.5  NEUTROABS 4.5 2.9 2.5 2.4  HGB 7.1* 8.4* 8.3* 7.2*  HCT 22.1* 27.3* 25.6* 22.8*  MCV 92.5 93.8 92.4 93.1  PLT 138* 133* 134* 893*   Basic Metabolic Panel: Recent Labs  Lab 02/10/22 1723 02/11/22 0212 02/11/22 0558 02/11/22 1333 02/11/22 1335 02/13/22 1636 02/14/22 0415  NA 122* 124* 124* 124* 124* 124* 129*  K 3.4* 3.5 3.6  --  3.4* 4.2 4.4  CL 91* 96* 93*  --  95* 95* 100  CO2 23 22 20*  --  22 20* 21*  GLUCOSE 118* 108* 102*  --  145* 111* 115*  BUN 14 14 15   --  15 14 14   CREATININE 0.86 0.84 0.78  --  0.83 0.97 0.97  CALCIUM 7.9* 7.7* 8.1*  --  7.6* 8.3* 8.0*  MG 1.8  --  1.7  --   --  1.6* 2.0  PHOS 4.0  --   --   --   --   --   --    GFR: Estimated Creatinine Clearance: 72.2 mL/min (by C-G formula based on SCr of 0.97 mg/dL). Liver Function Tests: Recent Labs  Lab 02/10/22 1723 02/11/22 0558 02/13/22 1636 02/14/22 0415  AST 74* 79* 78* 62*  ALT 43 43 47* 40  ALKPHOS 86 89 96 89  BILITOT 3.4* 5.0* 3.8* 2.6*  PROT 5.1* 5.4* 5.9* 4.8*  ALBUMIN 2.7* 2.8* 3.0* 2.5*   Recent Labs  Lab 02/10/22 1723 02/13/22 1636  LIPASE 42 42   Recent Labs  Lab 02/10/22 1723 02/11/22 0558 02/13/22 1636 02/14/22 0415  AMMONIA 143* 47* 101* 60*   Coagulation Profile: Recent Labs  Lab 02/10/22 1723 02/13/22 1636  INR 1.7* 1.7*   Cardiac Enzymes: No results for input(s): "CKTOTAL", "CKMB", "CKMBINDEX", "TROPONINI" in the last 168 hours. BNP (last 3 results) No results for input(s): "PROBNP" in the last 8760 hours. HbA1C: No results for input(s): "HGBA1C" in the last 72 hours. CBG: No results for input(s): "GLUCAP" in the last 168 hours. Lipid Profile: No results for input(s): "CHOL", "HDL", "LDLCALC", "TRIG", "CHOLHDL", "LDLDIRECT" in the last 72 hours. Thyroid Function Tests: No results for input(s): "TSH", "T4TOTAL", "FREET4", "T3FREE", "THYROIDAB" in the last 72 hours. Anemia  Panel: No results for input(s): "VITAMINB12", "FOLATE", "FERRITIN", "TIBC", "IRON", "RETICCTPCT" in the last 72 hours. Sepsis Labs: Recent Labs  Lab 02/10/22 1723 02/11/22 0558 02/13/22 1636 02/13/22 1813  LATICACIDVEN 2.0* 1.7 2.0* 1.8    Recent Results (from the past 240 hour(s))  Gram stain     Status: None   Collection Time: 02/13/22  8:02 PM   Specimen: Peritoneal Cavity  Result Value Ref Range Status   Specimen Description PERITONEAL CAVITY  Final   Special Requests NONE  Final   Gram Stain   Final    NO ORGANISMS SEEN WBC PRESENT, PREDOMINANTLY MONONUCLEAR CYTOSPIN SMEAR Performed at [facility], [address], [place], [place] [address]    Report Status 02/13/2022 FINAL  Final     Radiology Studies: CT ABDOMEN PELVIS W CONTRAST  Result Date: 02/13/2022 CLINICAL DATA:  Abdominal pain and distension with loss of appetite. Cirrhosis. Weakness, fatigue, and confusion. EXAM: CT ABDOMEN AND PELVIS WITH CONTRAST TECHNIQUE: Multidetector CT imaging of the abdomen and pelvis was performed using the standard protocol following bolus administration of intravenous contrast. RADIATION DOSE REDUCTION: This exam was performed according to the departmental dose-optimization program which includes automated exposure control, adjustment of the mA and/or kV according to patient size and/or use of iterative reconstruction technique. CONTRAST:  46mL OMNIPAQUE IOHEXOL 300 MG/ML  SOLN COMPARISON:  12/03/2021 FINDINGS: Lower chest: Mild scarring in the posterior basal segment right lower lobe. Hepatobiliary: Hepatic cirrhosis is present with some increase in geographic hypodensity throughout the liver probably reflecting a component of steatosis. This appears infiltrative rather than masslike. Gallbladder surgically absent.  No biliary dilatation. Pancreas: Unremarkable Spleen: Continued splenomegaly, heterogeneous splenic enhancement is probably contrast phase related and I am skeptical this  appearance is related to splenic infarct. The spleen measures 17.1 by 9.1 by 20.7 cm (volume = 1700 cm^3). Adrenals/Urinary Tract: Unremarkable Stomach/Bowel: Possible wall thickening in the antrum of the stomach, cannot exclude  antritis. Continued mild wall thickening in loops of duodenum and jejunum as well as the right colon, favoring portal enterocolopathy. Vascular/Lymphatic: Portal venous hypertension with recanalized umbilical vein and various other collaterals. The portal vein and splenic vein currently appear patent. Reactive porta hepatis and peripancreatic lymph nodes. Reproductive: Uterus absent.  Adnexa unremarkable. Other: Moderate to large amount of ascites.  Mesenteric edema. Musculoskeletal: Upper abdominal midline ventral hernia containing adipose tissue, image 69 series 6. Small umbilical hernia contains ascites. Subcutaneous edema noted along the right flank and along the anterior abdominal wall inferiorly, and tracking along the lateral upper thigh region. IMPRESSION: 1. Hepatic cirrhosis with portal venous hypertension, splenomegaly, and moderate to large amount of ascites. Secondary signs of portal venous hypertension including recanalized umbilical vein. 2. Continued wall thickening in loops of duodenum, jejunum, and right colon, favoring portal enterocolopathy. 3. Possible wall thickening in the antrum of the stomach, cannot exclude antritis. 4. Upper abdominal midline ventral hernia containing adipose tissue. Small umbilical hernia contains ascites. 5. Subcutaneous edema along the right flank and anterior abdominal wall inferiorly, and tracking along the lateral upper thigh region. 6. Heterogeneous splenic enhancement is probably contrast phase related and I am skeptical this is related to splenic infarct. 7. Reactive porta hepatis and peripancreatic lymph nodes. Electronically Signed   By: Gaylyn Rong M.D.   On: 02/13/2022 18:47   DG Chest Portable 1 View  Result Date:  02/13/2022 CLINICAL DATA:  Abdominal pain and distention with loss of appetite. Weakness. EXAM: PORTABLE CHEST 1 VIEW COMPARISON:  AP chest 12/18/2021 FINDINGS: Cardiac silhouette and mediastinal contours are within normal limits. The lungs are clear. No pleural effusion or pneumothorax. No acute skeletal abnormality. IMPRESSION: No active disease. Electronically Signed   By: Neita Garnet M.D.   On: 02/13/2022 17:07    Scheduled Meds:  heparin  5,000 Units Subcutaneous Q8H   pantoprazole  40 mg Oral Daily   spironolactone  100 mg Oral Daily   Continuous Infusions:  cefTRIAXone (ROCEPHIN)  IV       LOS: 0 days   Hughie Closs, MD [facility]  02/14/2022, 9:37 AM   *Please note that this is a verbal dictation therefore any spelling or grammatical errors are due to the "[employer] One" system interpretation.  Please page via Amion and do not message via secure chat for urgent patient care matters. Secure chat can be used for non urgent patient care matters.  How to contact the [facility] Attending or Consulting provider 7A - 7P or covering provider during after hours 7P -7A, for this patient?  Check the care team in [facility] and look for a) attending/consulting [facility] provider listed and b) the [facility] team listed. Page or secure chat 7A-7P. Log into [email or web address] and use [facility]'s universal password to access. If you do not have the password, please contact the hospital operator. Locate the [facility] provider you are looking for under [facility] and page to a number that you can be directly reached. If you still have difficulty reaching the provider, please page the [facility] (Director on Call) for the Hospitalists listed on amion for assistance.

## 2022-02-14 NOTE — Progress Notes (Signed)
Pt's bed is saturated from leakage from paracentesis sites (both sites are in RLQ quadrant). New linens placed, new gown placed.  ABD Dressing placed to RLQ. Dr. Richarda OsmondLamont Snowball made aware. Erica Banks

## 2022-02-15 DIAGNOSIS — K7682 Hepatic encephalopathy: Secondary | ICD-10-CM | POA: Diagnosis not present

## 2022-02-15 LAB — CBC
HCT: 24.6 % — ABNORMAL LOW (ref 36.0–46.0)
HCT: 23.9 % — ABNORMAL LOW (ref 36.0–46.0)
Hemoglobin: 7.8 g/dL — ABNORMAL LOW (ref 12.0–15.0)
Hemoglobin: 7.6 g/dL — ABNORMAL LOW (ref 12.0–15.0)
MCH: 29.8 pg (ref 26.0–34.0)
MCH: 29.8 pg (ref 26.0–34.0)
MCHC: 31.7 g/dL (ref 30.0–36.0)
MCHC: 31.8 g/dL (ref 30.0–36.0)
MCV: 93.9 fL (ref 80.0–100.0)
MCV: 93.7 fL (ref 80.0–100.0)
Platelets: 128 10*3/uL — ABNORMAL LOW (ref 150–400)
Platelets: 133 10*3/uL — ABNORMAL LOW (ref 150–400)
RBC: 2.62 MIL/uL — ABNORMAL LOW (ref 3.87–5.11)
RBC: 2.55 MIL/uL — ABNORMAL LOW (ref 3.87–5.11)
RDW: 16.8 % — ABNORMAL HIGH (ref 11.5–15.5)
RDW: 16.4 % — ABNORMAL HIGH (ref 11.5–15.5)
WBC: 5.8 10*3/uL (ref 4.0–10.5)
WBC: 5.1 10*3/uL (ref 4.0–10.5)
nRBC: 0.3 % — ABNORMAL HIGH (ref 0.0–0.2)
nRBC: 0 % (ref 0.0–0.2)

## 2022-02-15 LAB — CBC WITH DIFFERENTIAL/PLATELET
Abs Immature Granulocytes: 0.01 10*3/uL (ref 0.00–0.07)
Basophils Absolute: 0 10*3/uL (ref 0.0–0.1)
Basophils Relative: 1 %
Eosinophils Absolute: 0.2 10*3/uL (ref 0.0–0.5)
Eosinophils Relative: 3 %
HCT: 22.6 % — ABNORMAL LOW (ref 36.0–46.0)
Hemoglobin: 7.1 g/dL — ABNORMAL LOW (ref 12.0–15.0)
Immature Granulocytes: 0 %
Lymphocytes Relative: 41 %
Lymphs Abs: 2.1 10*3/uL (ref 0.7–4.0)
MCH: 29.3 pg (ref 26.0–34.0)
MCHC: 31.4 g/dL (ref 30.0–36.0)
MCV: 93.4 fL (ref 80.0–100.0)
Monocytes Absolute: 0.7 10*3/uL (ref 0.1–1.0)
Monocytes Relative: 13 %
Neutro Abs: 2.2 10*3/uL (ref 1.7–7.7)
Neutrophils Relative %: 42 %
Platelets: 120 10*3/uL — ABNORMAL LOW (ref 150–400)
RBC: 2.42 MIL/uL — ABNORMAL LOW (ref 3.87–5.11)
RDW: 16.9 % — ABNORMAL HIGH (ref 11.5–15.5)
WBC: 5.1 10*3/uL (ref 4.0–10.5)
nRBC: 0 % (ref 0.0–0.2)

## 2022-02-15 LAB — COMPREHENSIVE METABOLIC PANEL
ALT: 36 U/L (ref 0–44)
AST: 55 U/L — ABNORMAL HIGH (ref 15–41)
Albumin: 2.4 g/dL — ABNORMAL LOW (ref 3.5–5.0)
Alkaline Phosphatase: 87 U/L (ref 38–126)
Anion gap: 8 (ref 5–15)
BUN: 15 mg/dL (ref 6–20)
CO2: 19 mmol/L — ABNORMAL LOW (ref 22–32)
Calcium: 7.8 mg/dL — ABNORMAL LOW (ref 8.9–10.3)
Chloride: 99 mmol/L (ref 98–111)
Creatinine, Ser: 0.92 mg/dL (ref 0.44–1.00)
GFR, Estimated: >60 mL/min (ref >60)
Glucose, Bld: 126 mg/dL — ABNORMAL HIGH (ref 70–99)
Potassium: 3.4 mmol/L — ABNORMAL LOW (ref 3.5–5.1)
Sodium: 126 mmol/L — ABNORMAL LOW (ref 135–145)
Total Bilirubin: 2.1 mg/dL — ABNORMAL HIGH (ref 0.3–1.2)
Total Protein: 4.6 g/dL — ABNORMAL LOW (ref 6.5–8.1)

## 2022-02-15 LAB — IRON AND TIBC
Iron: 21 ug/dL — ABNORMAL LOW (ref 28–170)
Saturation Ratios: 9 % — ABNORMAL LOW (ref 10.4–31.8)
TIBC: 225 ug/dL — ABNORMAL LOW (ref 250–450)
UIBC: 204 ug/dL

## 2022-02-15 LAB — RETICULOCYTES
Immature Retic Fract: 15.5 % (ref 2.3–15.9)
RBC.: 2.64 MIL/uL — ABNORMAL LOW (ref 3.87–5.11)
Retic Count, Absolute: 89.8 10*3/uL (ref 19.0–186.0)
Retic Ct Pct: 3.4 % — ABNORMAL HIGH (ref 0.4–3.1)

## 2022-02-15 LAB — VITAMIN B12: Vitamin B-12: 937 pg/mL — ABNORMAL HIGH (ref 180–914)

## 2022-02-15 LAB — LD, BODY FLUID (OTHER): LD, Body Fluid: 18 IU/L

## 2022-02-15 LAB — MAGNESIUM: Magnesium: 1.9 mg/dL (ref 1.7–2.4)

## 2022-02-15 LAB — FERRITIN: Ferritin: 47 ng/mL (ref 11–307)

## 2022-02-15 LAB — AMMONIA: Ammonia: 55 umol/L — ABNORMAL HIGH (ref 9–35)

## 2022-02-15 LAB — FOLATE: Folate: 20.9 ng/mL (ref >5.9)

## 2022-02-15 MED ORDER — POTASSIUM CHLORIDE CRYS ER 20 MEQ PO TBCR
40.0000 meq | EXTENDED_RELEASE_TABLET | Freq: Once | ORAL | Status: AC
  Administered 2022-02-15: 40 meq via ORAL
  Filled 2022-02-15: qty 2

## 2022-02-15 MED ORDER — METHOCARBAMOL 500 MG PO TABS
500.0000 mg | ORAL_TABLET | Freq: Every evening | ORAL | Status: DC | PRN
  Administered 2022-02-15: 500 mg via ORAL
  Filled 2022-02-15: qty 1

## 2022-02-15 MED ORDER — ALBUMIN HUMAN 25 % IV SOLN
12.5000 g | Freq: Four times a day (QID) | INTRAVENOUS | Status: AC
  Administered 2022-02-15 (×3): 12.5 g via INTRAVENOUS
  Filled 2022-02-15 (×3): qty 50

## 2022-02-15 NOTE — Progress Notes (Signed)
PROGRESS NOTE    Erica Banks  ZOX:[id number] DOB: 1980/12/07 DOA: 02/13/2022 PCP: Neale Burly, MD   Brief Narrative:  Erica Banks is a 42 y.o. female with medical history significant of anemia, alcoholic cirrhosis, neuropathy, history of DVT/PE, and more presents ED with a chief complaint of not feeling well.  Patient has had fatigue and malaise, and has been a poor historian for both admissions.  I recently admitted her 3 days ago, but she seems more confused this time.  Is likely due to the fact that she had a couple of opiate medications prior to my interview with her.  Spouse was present when she was examined by the ER provider.  Spouse had reported that she had intermittent confusion, nausea, vomiting, diarrhea over the past 2 to 3 days.  She had not taken her lactulose in the past 2 days secondary to abdominal discomfort with diarrhea.  When I talked to patient she just tells me she feels terrible.  When asked her what feels terrible she cannot describe it.  She drifts back into sleep.  She told me she needed a sip of water about 3 times, each time was showing her that her water was right next to her.  Ultimately she cannot give me any usable history. Last paracentesis was January 15th.  Assessment & Plan:   Principal Problem:   Hepatic encephalopathy (HCC) Active Problems:   Hypoalbuminemia   Ascites due to alcoholic cirrhosis (HCC)   Hypomagnesemia   Transaminitis   Abdominal pain  Hepatic encephalopathy (HCC) Ammonia level over 100 upon admission, no bowel movements recorded in the chart but patient's ammonia has improved to 50, patient is fully alert and oriented, continue lactulose.     Abdominal pain Although she did complain of abdominal pain upon admission and on the next day but no complaints today, but on examination, she has no tenderness, she has positive fluid shift and severe abdominal distention(slightly better than yesterday) due to ascites but with no  tenderness, s/p paracentesis 02/14/2022 with a yield of 5 L of fluid, fluid analysis negative for SBP, will discontinue antibiotics.  Acute on chronic anemia: Baseline hemoglobin between 8-9.  Current hemoglobin 7.1.  Patient complains of weakness.  No source of bleeding.  But due to history of cirrhosis, she may be slowly bleeding from varices.  Will check FOBT and complete anemia workup.  Check hemoglobin every 12 hours and transfuse if less than 7.   Transaminitis: Chronic and improving.   Hypomagnesemia Replaced and resolved.  Ascites due to alcoholic cirrhosis (HCC) - Last paracentesis was at last admission a few days ago at 4.4 L output Paracentesis ordered.   Hypoalbuminemia - Albumin 3.0 - Secondary to poor p.o. intake and alcoholic cirrhosis - Encourage nutrient dense food intake, will order albumin after paracentesis.   Hypokalemia: Will replace.  Chronic hyponatremia: Her sodium ranges anywhere from 1 24-1 30, currently 126.  She is asymptomatic.  DVT prophylaxis: heparin injection 5,000 Units Start: 02/13/22 2200 SCDs Start: 02/13/22 2153   Code Status: Full Code  Family Communication:  None present at bedside.  Plan of care discussed with patient in length and he/she verbalized understanding and agreed with it.  Status is: Inpatient Remains inpatient appropriate because: Hemoglobin at borderline for possible requirement for transfusion later today.     Estimated body mass index is 22.92 kg/m as calculated from the following:   Height as of this encounter: 5\' 6"  (1.676 m).   Weight as  of this encounter: 64.4 kg.    Nutritional Assessment: Body mass index is 22.92 kg/m.Marland Kitchen Seen by dietician.  I agree with the assessment and plan as outlined below: Nutrition Status:        . Skin Assessment: I have examined the patient's skin and I agree with the wound assessment as performed by the wound care RN as outlined below:    Consultants:  None  Procedures:  As  above  Antimicrobials:  Anti-infectives (From admission, onward)    Start     Dose/Rate Route Frequency Ordered Stop   02/14/22 2100  cefTRIAXone (ROCEPHIN) 1 g in sodium chloride 0.9 % 100 mL IVPB  Status:  Discontinued        1 g 200 mL/hr over 30 Minutes Intravenous Every 24 hours 02/13/22 2152 02/13/22 2203   02/14/22 2000  cefTRIAXone (ROCEPHIN) 2 g in sodium chloride 0.9 % 100 mL IVPB  Status:  Discontinued        2 g 200 mL/hr over 30 Minutes Intravenous Every 24 hours 02/13/22 2203 02/15/22 0808   02/13/22 2000  cefTRIAXone (ROCEPHIN) 2 g in sodium chloride 0.9 % 100 mL IVPB        2 g 200 mL/hr over 30 Minutes Intravenous  Once 02/13/22 1955 02/13/22 2105         Subjective:  Patient seen and examined.  She complains of some weakness and right leg cramps and she is requesting to resume her Robaxin.  Objective: Vitals:   02/14/22 1020 02/14/22 1044 02/14/22 2037 02/15/22 0511  BP: 122/61 (!) 100/54 (!) 110/58 130/76  Pulse: 86 89 93 88  Resp: 18 16 18 14   Temp:  97.8 F (36.6 C) 98.7 F (37.1 C) (!) 97 F (36.1 C)  TempSrc:  Oral    SpO2: 98% 100% 100% 100%  Weight:    64.4 kg  Height:        Intake/Output Summary (Last 24 hours) at 02/15/2022 0938 Last data filed at 02/15/2022 0626 Gross per 24 hour  Intake 820 ml  Output --  Net 820 ml    Filed Weights   02/13/22 2245 02/14/22 0641 02/15/22 0511  Weight: 70.9 kg 69.6 kg 64.4 kg    Examination:  General exam: Appears calm and comfortable  Respiratory system: Clear to auscultation. Respiratory effort normal. Cardiovascular system: S1 & S2 heard, RRR. No JVD, murmurs, rubs, gallops or clicks. No pedal edema. Gastrointestinal system: Abdomen is soft, moderately distended and nontender. No organomegaly or masses felt. Normal bowel sounds heard. Central nervous system: Alert and oriented. No focal neurological deficits. Extremities: Symmetric 5 x 5 power. Skin: No rashes, lesions or ulcers.  Psychiatry:  Judgement and insight appear normal. Mood & affect appropriate.   Data Reviewed: I have personally reviewed following labs and imaging studies  CBC: Recent Labs  Lab 02/10/22 1723 02/11/22 0558 02/13/22 1636 02/14/22 0415 02/15/22 0342 02/15/22 0832  WBC 9.3 8.2 5.9 5.5 5.1 5.8  NEUTROABS 4.5 2.9 2.5 2.4 2.2  --   HGB 7.1* 8.4* 8.3* 7.2* 7.1* 7.8*  HCT 22.1* 27.3* 25.6* 22.8* 22.6* 24.6*  MCV 92.5 93.8 92.4 93.1 93.4 93.9  PLT 138* 133* 134* 122* 120* 128*    Basic Metabolic Panel: Recent Labs  Lab 02/10/22 1723 02/11/22 0212 02/11/22 0558 02/11/22 1333 02/11/22 1335 02/13/22 1636 02/14/22 0415 02/15/22 0342  NA 122*   < > 124* 124* 124* 124* 129* 126*  K 3.4*   < > 3.6  --  3.4* 4.2 4.4 3.4*  CL 91*   < > 93*  --  95* 95* 100 99  CO2 23   < > 20*  --  22 20* 21* 19*  GLUCOSE 118*   < > 102*  --  145* 111* 115* 126*  BUN 14   < > 15  --  15 14 14 15   CREATININE 0.86   < > 0.78  --  0.83 0.97 0.97 0.92  CALCIUM 7.9*   < > 8.1*  --  7.6* 8.3* 8.0* 7.8*  MG 1.8  --  1.7  --   --  1.6* 2.0 1.9  PHOS 4.0  --   --   --   --   --   --   --    < > = values in this interval not displayed.    GFR: Estimated Creatinine Clearance: 76.1 mL/min (by C-G formula based on SCr of 0.92 mg/dL). Liver Function Tests: Recent Labs  Lab 02/10/22 1723 02/11/22 0558 02/13/22 1636 02/14/22 0415 02/15/22 0342  AST 74* 79* 78* 62* 55*  ALT 43 43 47* 40 36  ALKPHOS 86 89 96 89 87  BILITOT 3.4* 5.0* 3.8* 2.6* 2.1*  PROT 5.1* 5.4* 5.9* 4.8* 4.6*  ALBUMIN 2.7* 2.8* 3.0* 2.5* 2.4*    Recent Labs  Lab 02/10/22 1723 02/13/22 1636  LIPASE 42 42    Recent Labs  Lab 02/10/22 1723 02/11/22 0558 02/13/22 1636 02/14/22 0415 02/15/22 0342  AMMONIA 143* 47* 101* 60* 55*    Coagulation Profile: Recent Labs  Lab 02/10/22 1723 02/13/22 1636  INR 1.7* 1.7*    Cardiac Enzymes: No results for input(s): "CKTOTAL", "CKMB", "CKMBINDEX", "TROPONINI" in the last 168 hours. BNP  (last 3 results) No results for input(s): "PROBNP" in the last 8760 hours. HbA1C: No results for input(s): "HGBA1C" in the last 72 hours. CBG: No results for input(s): "GLUCAP" in the last 168 hours. Lipid Profile: No results for input(s): "CHOL", "HDL", "LDLCALC", "TRIG", "CHOLHDL", "LDLDIRECT" in the last 72 hours. Thyroid Function Tests: No results for input(s): "TSH", "T4TOTAL", "FREET4", "T3FREE", "THYROIDAB" in the last 72 hours. Anemia Panel: Recent Labs    02/15/22 0832  VITAMINB12 937*  FOLATE 20.9  FERRITIN 47  TIBC 225*  IRON 21*  RETICCTPCT 3.4*   Sepsis Labs: Recent Labs  Lab 02/10/22 1723 02/11/22 0558 02/13/22 1636 02/13/22 1813  LATICACIDVEN 2.0* 1.7 2.0* 1.8     Recent Results (from the past 240 hour(s))  Body fluid culture w Gram Stain     Status: None (Preliminary result)   Collection Time: 02/13/22  8:02 PM   Specimen: Peritoneal Cavity; Peritoneal Fluid  Result Value Ref Range Status   Specimen Description   Final    PERITONEAL CAVITY Performed at [facility], [address], [place], [place]    Special Requests   Final    NONE Performed at [facility], [address] [place], [place]    Gram Stain   Final    NO ORGANISMS SEEN RARE WBC PRESENT, PREDOMINANTLY PMN    Culture   Final    NO GROWTH 1 DAY Performed at [facility], [address] [place], [place]    Report Status PENDING  Incomplete  Gram stain     Status: None   Collection Time: 02/13/22  8:02 PM   Specimen: Peritoneal Cavity  Result Value Ref Range Status   Specimen Description PERITONEAL CAVITY  Final  Special Requests NONE  Final   Gram Stain   Final    NO ORGANISMS SEEN WBC PRESENT, PREDOMINANTLY MONONUCLEAR CYTOSPIN SMEAR Performed at [facility], [address] [place], [place] [address]    Report Status 02/13/2022 FINAL  Final  Culture, body fluid w Gram Stain-bottle     Status: None (Preliminary result)   Collection Time:  02/14/22 10:00 AM   Specimen: Peritoneal Washings  Result Value Ref Range Status   Specimen Description PERITONEAL  Final   Special Requests BOTTLES DRAWN AEROBIC AND ANAEROBIC 10CC  Final   Culture   Final    NO GROWTH < 24 HOURS Performed at [facility], [address] [place], [place] [address]    Report Status PENDING  Incomplete  Gram stain     Status: None   Collection Time: 02/14/22 10:00 AM   Specimen: Peritoneal Washings  Result Value Ref Range Status   Specimen Description PERITONEAL  Final   Special Requests NONE  Final   Gram Stain   Final    NO ORGANISMS SEEN WBC PRESENT,BOTH PMN AND MONONUCLEAR CYTOSPIN SMEAR Performed at [facility], [address], [place], [place] [address]    Report Status 02/14/2022 FINAL  Final     Radiology Studies: US Paracentesis  Result Date: 02/14/2022 INDICATION: Cirrhosis; recurrent ascites EXAM: ULTRASOUND GUIDED LLQ PARACENTESIS MEDICATIONS: 10 cc 1% lidocaine. COMPLICATIONS: None immediate. PROCEDURE: Informed written consent was obtained from the patient after a discussion of the risks, benefits and alternatives to treatment. A timeout was performed prior to the initiation of the procedure. Initial ultrasound scanning demonstrates a large amount of ascites within the left lower abdominal quadrant. The left lower abdomen was prepped and draped in the usual sterile fashion. 1% lidocaine was used for local anesthesia. Following this, a 7 cm Yueh catheter was introduced. An ultrasound image was saved for documentation purposes. The paracentesis was performed. The catheter was removed and a dressing was applied. The patient tolerated the procedure well without immediate post procedural complication. Patient received post-procedure intravenous albumin; see nursing notes for details. FINDINGS: A total of approximately 5 liters of yellow fluid was removed. Samples were sent to the laboratory as requested by the clinical team. IMPRESSION: Successful  ultrasound-guided paracentesis yielding 5 liters of peritoneal fluid. PLAN: The patient has required >/=2 paracenteses in a 30 day period and a formal evaluation by the [facility] has been arranged. Read by Lavonia Drafts [facility] Electronically Signed   By: Lavonia Dana M.D.   On: 02/14/2022 10:33   CT ABDOMEN PELVIS W CONTRAST  Result Date: 02/13/2022 CLINICAL DATA:  Abdominal pain and distension with loss of appetite. Cirrhosis. Weakness, fatigue, and confusion. EXAM: CT ABDOMEN AND PELVIS WITH CONTRAST TECHNIQUE: Multidetector CT imaging of the abdomen and pelvis was performed using the standard protocol following bolus administration of intravenous contrast. RADIATION DOSE REDUCTION: This exam was performed according to the departmental dose-optimization program which includes automated exposure control, adjustment of the mA and/or kV according to patient size and/or use of iterative reconstruction technique. CONTRAST:  29mL OMNIPAQUE IOHEXOL 300 MG/ML  SOLN COMPARISON:  12/03/2021 FINDINGS: Lower chest: Mild scarring in the posterior basal segment right lower lobe. Hepatobiliary: Hepatic cirrhosis is present with some increase in geographic hypodensity throughout the liver probably reflecting a component of steatosis. This appears infiltrative rather than masslike. Gallbladder surgically absent.  No biliary dilatation. Pancreas: Unremarkable Spleen: Continued splenomegaly, heterogeneous splenic enhancement is probably contrast phase related and  I am skeptical this appearance is related to splenic infarct. The spleen measures 17.1 by 9.1 by 20.7 cm (volume = 1700 cm^3). Adrenals/Urinary Tract: Unremarkable Stomach/Bowel: Possible wall thickening in the antrum of the stomach, cannot exclude antritis. Continued mild wall thickening in loops of duodenum and jejunum as well as the right colon, favoring portal enterocolopathy. Vascular/Lymphatic: Portal venous  hypertension with recanalized umbilical vein and various other collaterals. The portal vein and splenic vein currently appear patent. Reactive porta hepatis and peripancreatic lymph nodes. Reproductive: Uterus absent.  Adnexa unremarkable. Other: Moderate to large amount of ascites.  Mesenteric edema. Musculoskeletal: Upper abdominal midline ventral hernia containing adipose tissue, image 69 series 6. Small umbilical hernia contains ascites. Subcutaneous edema noted along the right flank and along the anterior abdominal wall inferiorly, and tracking along the lateral upper thigh region. IMPRESSION: 1. Hepatic cirrhosis with portal venous hypertension, splenomegaly, and moderate to large amount of ascites. Secondary signs of portal venous hypertension including recanalized umbilical vein. 2. Continued wall thickening in loops of duodenum, jejunum, and right colon, favoring portal enterocolopathy. 3. Possible wall thickening in the antrum of the stomach, cannot exclude antritis. 4. Upper abdominal midline ventral hernia containing adipose tissue. Small umbilical hernia contains ascites. 5. Subcutaneous edema along the right flank and anterior abdominal wall inferiorly, and tracking along the lateral upper thigh region. 6. Heterogeneous splenic enhancement is probably contrast phase related and I am skeptical this is related to splenic infarct. 7. Reactive porta hepatis and peripancreatic lymph nodes. Electronically Signed   By: Van Clines M.D.   On: 02/13/2022 18:47   DG Chest Portable 1 View  Result Date: 02/13/2022 CLINICAL DATA:  Abdominal pain and distention with loss of appetite. Weakness. EXAM: PORTABLE CHEST 1 VIEW COMPARISON:  AP chest 12/18/2021 FINDINGS: Cardiac silhouette and mediastinal contours are within normal limits. The lungs are clear. No pleural effusion or pneumothorax. No acute skeletal abnormality. IMPRESSION: No active disease. Electronically Signed   By: Yvonne Kendall M.D.   On:  02/13/2022 17:07    Scheduled Meds:  heparin  5,000 Units Subcutaneous Q8H   lactulose  20 g Oral TID   melatonin  6 mg Oral QHS   pantoprazole  40 mg Oral Daily   spironolactone  100 mg Oral Daily   Continuous Infusions:     LOS: 1 day   Darliss Cheney, MD [facility]  02/15/2022, 9:38 AM   *Please note that this is a verbal dictation therefore any spelling or grammatical errors are due to the "[employer]" system interpretation.  Please page via [place] and do not message via secure chat for urgent patient care matters. Secure chat can be used for non urgent patient care matters.  How to contact the [facility] Attending or Consulting provider [place] or covering provider during after hours [place], for this patient?  Check the care team in [facility] and look for a) attending/consulting [facility] provider listed and b) the [facility] team listed. Page or secure chat [phone]. Log into [email or web address] and use [place]'s universal password to access. If you do not have the password, please contact the hospital operator. Locate the [facility] provider you are looking for under [facility] and page to a number that you can be directly reached. If you still have difficulty reaching the provider, please page the [facility] (Director on Call) for the Hospitalists listed on amion for assistance.

## 2022-02-15 NOTE — Progress Notes (Signed)
OT Cancellation Note  Patient Details Name: Erica Banks MRN: [id number] DOB: [birth year]   Cancelled Treatment:    Reason Eval/Treat Not Completed: OT screened, no needs identified, will sign off. Pt was able to complete lower body dressing and walk in hall independently. Pt reports feeling back to normal. Pt will be removed from OT list. Thank you.   Charmion Hapke OT, MOT   Larey Seat 02/15/2022, 9:07 AM

## 2022-02-15 NOTE — Progress Notes (Signed)
Patient had a good day, received 2 doses of albumin. Patient has script for gabapentin for leg spasms. She has received 2 doses of oxycodone, she asked for a 3rd dose but her pain was a 4. I explained to her that her pain had to be at a higher level. So we talked about it and the patient will try to wait for her 2200 meds

## 2022-02-15 NOTE — Progress Notes (Signed)
Patient slept on and off this shift, dressing changed at the beginning of the shift. Zofran given once this shift. Continued to monitor.

## 2022-02-15 NOTE — Progress Notes (Signed)
PT Cancellation Note  Patient Details Name: Erica Banks MRN: [id number] DOB: April 05, 1980   Cancelled Treatment:    Reason Eval/Treat Not Completed: PT screened, no needs identified, will sign off; Patient seen with OT, patient able to complete all mobility independently and ambulate 200 feet without AD. Will sign off.  10:13 AM, 02/15/22 Mearl Latin PT, DPT Physical Therapist at [facility]

## 2022-02-16 DIAGNOSIS — K7682 Hepatic encephalopathy: Secondary | ICD-10-CM | POA: Diagnosis not present

## 2022-02-16 LAB — COMPREHENSIVE METABOLIC PANEL
ALT: 42 U/L (ref 0–44)
AST: 75 U/L — ABNORMAL HIGH (ref 15–41)
Albumin: 3 g/dL — ABNORMAL LOW (ref 3.5–5.0)
Alkaline Phosphatase: 97 U/L (ref 38–126)
Anion gap: 6 (ref 5–15)
BUN: 13 mg/dL (ref 6–20)
CO2: 20 mmol/L — ABNORMAL LOW (ref 22–32)
Calcium: 8.2 mg/dL — ABNORMAL LOW (ref 8.9–10.3)
Chloride: 98 mmol/L (ref 98–111)
Creatinine, Ser: 0.86 mg/dL (ref 0.44–1.00)
GFR, Estimated: >60 mL/min (ref >60)
Glucose, Bld: 118 mg/dL — ABNORMAL HIGH (ref 70–99)
Potassium: 4.1 mmol/L (ref 3.5–5.1)
Sodium: 124 mmol/L — ABNORMAL LOW (ref 135–145)
Total Bilirubin: 2.4 mg/dL — ABNORMAL HIGH (ref 0.3–1.2)
Total Protein: 5.5 g/dL — ABNORMAL LOW (ref 6.5–8.1)

## 2022-02-16 LAB — AMMONIA: Ammonia: 27 umol/L (ref 9–35)

## 2022-02-16 MED ORDER — ALBUMIN HUMAN 25 % IV SOLN
12.5000 g | Freq: Four times a day (QID) | INTRAVENOUS | Status: DC
  Administered 2022-02-16: 12.5 g via INTRAVENOUS
  Filled 2022-02-16: qty 50

## 2022-02-16 NOTE — Discharge Summary (Signed)
Physician Discharge Summary  Erica Banks PXT:[id number] DOB: Oct 11, 1980 DOA: 02/13/2022  PCP: Toma Deiters, MD  Admit date: 02/13/2022 Discharge date: 02/16/2022 30 Day Unplanned Readmission Risk Score    Flowsheet Row ED to Hosp-Admission (Current) from 02/13/2022 in [facility]  30 Day Unplanned Readmission Risk Score (%) 30.26 Filed at 02/16/2022 0800       This score is the patient's risk of an unplanned readmission within 30 days of being discharged (0 -100%). The score is based on dignosis, age, lab data, medications, orders, and past utilization.   Low:  0-14.9   Medium: 15-21.9   High: 22-29.9   Extreme: 30 and above          Admitted From: Home Disposition: Home  Recommendations for Outpatient Follow-up:  Follow up with PCP in 1-2 weeks Please obtain BMP/CBC in one week Please follow up with your PCP on the following pending results: Unresulted Labs (From admission, onward)     Start     Ordered   Unscheduled  Occult blood card to lab, stool  As needed,   R      02/15/22 0810              Home Health: None Equipment/Devices: None  Discharge Condition: Stable CODE STATUS: Full code Diet recommendation: Cardiac  Subjective: Seen examined.  She feels much better.  She feels that stronger, she is fully alert and oriented and she is feeling comfortable about going home today.  Brief/Interim Summary: Erica Banks is a 42 y.o. female with medical history significant of anemia, alcoholic cirrhosis, neuropathy, history of DVT/PE, and more presented to ED with a chief complaint of not feeling well.  Spouse had reported that she had intermittent confusion, nausea, vomiting, diarrhea over the past 2 to 3 days.  She had not taken her lactulose in the past 2 days secondary to abdominal discomfort with diarrhea.  Ultimately she was diagnosed with acute hepatic encephalopathy with elevated ammonia level and was admitted to hospital service, she  was continued on lactulose, she has had 2 bowel movements yesterday, her ammonia level is completely normal today and she is fully alert and oriented.     Abdominal pain Although she did complain of abdominal pain upon admission and on the next day but no complaints today, but on examination, she has no tenderness, she underwent paracentesis 02/14/2022 with a yield of 5 L of fluid, fluid analysis negative for SBP, antibiotics discontinued.  She has mild abdominal distention due to ascites.  IR services are not available at this campus (AP) today and I think paracentesis is not imminent and patient is fully aware how to contact IR if and when she needs paracentesis.   Acute on chronic anemia: Baseline hemoglobin between 8-9.  Current hemoglobin 7.6.  No indication of transfusion.   Transaminitis: Chronic and improving.   Hypomagnesemia Replaced and resolved.   Hypoalbuminemia - Albumin 3.0 - Secondary to poor p.o. intake and alcoholic cirrhosis - Encourage nutrient dense food intake, will order albumin after paracentesis.   Hypokalemia: Resolved.   Chronic hyponatremia: Her sodium ranges anywhere from 124-130, currently within her range.  She is asymptomatic.  Discharge plan was discussed with patient and/or family member and they verbalized understanding and agreed with it.  Discharge Diagnoses:  Principal Problem:   Hepatic encephalopathy (HCC) Active Problems:   Hypoalbuminemia   Ascites due to alcoholic cirrhosis (HCC)   Hypomagnesemia   Transaminitis   Abdominal pain  Discharge Instructions   Allergies as of 02/16/2022       Reactions   Demerol Anaphylaxis   Per patient cardiac arrest        Medication List     TAKE these medications    Constulose 10 GM/15ML solution Generic drug: lactulose Take 30 mLs (20 g total) by mouth 3 (three) times daily.   furosemide 40 MG tablet Commonly known as: LASIX TAKE 1 TABLET BY MOUTH DAILY   gabapentin 300 MG  capsule Commonly known as: NEURONTIN Take 300 mg by mouth 3 (three) times daily as needed (nerve pain).   hydrOXYzine 25 MG capsule Commonly known as: VISTARIL Take 25 mg by mouth 3 (three) times daily as needed for anxiety.   methocarbamol 500 MG tablet Commonly known as: ROBAXIN Take 500 mg by mouth at bedtime as needed for muscle spasms.   metoCLOPramide 5 MG tablet Commonly known as: REGLAN Take 5 mg by mouth 2 (two) times daily as needed.   ondansetron 4 MG disintegrating tablet Commonly known as: ZOFRAN-ODT Take 1 tablet (4 mg total) by mouth every 8 (eight) hours as needed for nausea or vomiting.   pantoprazole 40 MG tablet Commonly known as: PROTONIX Take 1 tablet (40 mg total) by mouth 2 (two) times daily before a meal. What changed: when to take this   spironolactone 100 MG tablet Commonly known as: ALDACTONE TAKE 1 TABLET BY MOUTH DAILY        Follow-up Information     Toma DeitersHasanaj, Xaje A, MD Follow up in 1 week(s).   Specialty: Internal Medicine Contact information: [address] [place] [id number] [phone]                Allergies  Allergen Reactions   Demerol Anaphylaxis    Per patient cardiac arrest    Consultations: IR   Procedures/Studies: US Paracentesis  Result Date: 02/14/2022 INDICATION: Cirrhosis; recurrent ascites EXAM: ULTRASOUND GUIDED LLQ PARACENTESIS MEDICATIONS: 10 cc 1% lidocaine. COMPLICATIONS: None immediate. PROCEDURE: Informed written consent was obtained from the patient after a discussion of the risks, benefits and alternatives to treatment. A timeout was performed prior to the initiation of the procedure. Initial ultrasound scanning demonstrates a large amount of ascites within the left lower abdominal quadrant. The left lower abdomen was prepped and draped in the usual sterile fashion. 1% lidocaine was used for local anesthesia. Following this, a 7 cm Yueh catheter was introduced. An ultrasound image was saved for  documentation purposes. The paracentesis was performed. The catheter was removed and a dressing was applied. The patient tolerated the procedure well without immediate post procedural complication. Patient received post-procedure intravenous albumin; see nursing notes for details. FINDINGS: A total of approximately 5 liters of yellow fluid was removed. Samples were sent to the laboratory as requested by the clinical team. IMPRESSION: Successful ultrasound-guided paracentesis yielding 5 liters of peritoneal fluid. PLAN: The patient has required >/=2 paracenteses in a 30 day period and a formal evaluation by the [facility] has been arranged. Read by Robet LeuPamela A [facility] Electronically Signed   By: Ulyses SouthwardMark  Boles M.D.   On: 02/14/2022 10:33   CT ABDOMEN PELVIS W CONTRAST  Result Date: 02/13/2022 CLINICAL DATA:  Abdominal pain and distension with loss of appetite. Cirrhosis. Weakness, fatigue, and confusion. EXAM: CT ABDOMEN AND PELVIS WITH CONTRAST TECHNIQUE: Multidetector CT imaging of the abdomen and pelvis was performed using the standard protocol following bolus administration of intravenous contrast. RADIATION DOSE  REDUCTION: This exam was performed according to the departmental dose-optimization program which includes automated exposure control, adjustment of the mA and/or kV according to patient size and/or use of iterative reconstruction technique. CONTRAST:  5mL OMNIPAQUE IOHEXOL 300 MG/ML  SOLN COMPARISON:  12/03/2021 FINDINGS: Lower chest: Mild scarring in the posterior basal segment right lower lobe. Hepatobiliary: Hepatic cirrhosis is present with some increase in geographic hypodensity throughout the liver probably reflecting a component of steatosis. This appears infiltrative rather than masslike. Gallbladder surgically absent.  No biliary dilatation. Pancreas: Unremarkable Spleen: Continued splenomegaly, heterogeneous splenic enhancement is probably  contrast phase related and I am skeptical this appearance is related to splenic infarct. The spleen measures 17.1 by 9.1 by 20.7 cm (volume = 1700 cm^3). Adrenals/Urinary Tract: Unremarkable Stomach/Bowel: Possible wall thickening in the antrum of the stomach, cannot exclude antritis. Continued mild wall thickening in loops of duodenum and jejunum as well as the right colon, favoring portal enterocolopathy. Vascular/Lymphatic: Portal venous hypertension with recanalized umbilical vein and various other collaterals. The portal vein and splenic vein currently appear patent. Reactive porta hepatis and peripancreatic lymph nodes. Reproductive: Uterus absent.  Adnexa unremarkable. Other: Moderate to large amount of ascites.  Mesenteric edema. Musculoskeletal: Upper abdominal midline ventral hernia containing adipose tissue, image 69 series 6. Small umbilical hernia contains ascites. Subcutaneous edema noted along the right flank and along the anterior abdominal wall inferiorly, and tracking along the lateral upper thigh region. IMPRESSION: 1. Hepatic cirrhosis with portal venous hypertension, splenomegaly, and moderate to large amount of ascites. Secondary signs of portal venous hypertension including recanalized umbilical vein. 2. Continued wall thickening in loops of duodenum, jejunum, and right colon, favoring portal enterocolopathy. 3. Possible wall thickening in the antrum of the stomach, cannot exclude antritis. 4. Upper abdominal midline ventral hernia containing adipose tissue. Small umbilical hernia contains ascites. 5. Subcutaneous edema along the right flank and anterior abdominal wall inferiorly, and tracking along the lateral upper thigh region. 6. Heterogeneous splenic enhancement is probably contrast phase related and I am skeptical this is related to splenic infarct. 7. Reactive porta hepatis and peripancreatic lymph nodes. Electronically Signed   By: Gaylyn Rong M.D.   On: 02/13/2022 18:47   DG  Chest Portable 1 View  Result Date: 02/13/2022 CLINICAL DATA:  Abdominal pain and distention with loss of appetite. Weakness. EXAM: PORTABLE CHEST 1 VIEW COMPARISON:  AP chest 12/18/2021 FINDINGS: Cardiac silhouette and mediastinal contours are within normal limits. The lungs are clear. No pleural effusion or pneumothorax. No acute skeletal abnormality. IMPRESSION: No active disease. Electronically Signed   By: Neita Garnet M.D.   On: 02/13/2022 17:07   US Paracentesis  Result Date: 02/11/2022 INDICATION: Alcoholic cirrhosis, ascites EXAM: ULTRASOUND GUIDED THERAPEUTIC PARACENTESIS MEDICATIONS: None. COMPLICATIONS: None immediate. PROCEDURE: Informed written consent was obtained from the patient after a discussion of the risks, benefits and alternatives to treatment. A timeout was performed prior to the initiation of the procedure. Initial ultrasound scanning demonstrates a large amount of ascites within the right lower abdominal quadrant. The right lower abdomen was prepped and draped in the usual sterile fashion. 1% lidocaine was used for local anesthesia. Following this, a 19 gauge, 7-cm, Yueh catheter was introduced. An ultrasound image was saved for documentation purposes. The paracentesis was performed. The catheter was removed and a dressing was applied. The patient tolerated the procedure well without immediate post procedural complication. Patient received post-procedure intravenous albumin; see nursing notes for details. FINDINGS: A total of approximately 4.4 L of yellow  colored fluid was removed. IMPRESSION: Successful ultrasound-guided paracentesis yielding 4.4 liters of peritoneal fluid. Electronically Signed   By: Ulyses Southward M.D.   On: 02/11/2022 10:30   [place] ASCITES (ABDOMEN LIMITED)  Result Date: 02/05/2022 CLINICAL DATA:  Ascites. EXAM: LIMITED ABDOMEN ULTRASOUND FOR ASCITES TECHNIQUE: Limited ultrasound survey for ascites was performed in all four abdominal quadrants. COMPARISON:  January 31, 2022. FINDINGS: Only a minimal to mild amount of ascites is noted in the right lower quadrant, with no significant ascites seen in the other quadrants. IMPRESSION: Minimal to mild amount of ascites is noted. No adequate fluid pocket for paracentesis is noted. Electronically Signed   By: Lupita Raider M.D.   On: 02/05/2022 12:56   US Paracentesis  Result Date: 01/31/2022 INDICATION: Alcohol Cirrhosis; recurrent ascites EXAM: ULTRASOUND GUIDED RLQ PARACENTESIS MEDICATIONS: 10 cc 1% lidocaine. COMPLICATIONS: None immediate. PROCEDURE: Informed written consent was obtained from the patient after a discussion of the risks, benefits and alternatives to treatment. A timeout was performed prior to the initiation of the procedure. Initial ultrasound scanning demonstrates a large amount of ascites within the right lower abdominal quadrant. The right lower abdomen was prepped and draped in the usual sterile fashion. 1% lidocaine was used for local anesthesia. Following this, a 7 cm Yueh catheter was introduced. An ultrasound image was saved for documentation purposes. The paracentesis was performed. The catheter was removed and a dressing was applied. The patient tolerated the procedure well without immediate post procedural complication. FINDINGS: A total of approximately 1.4 liters of yellow fluid was removed. Samples were sent to the laboratory as requested by the clinical team. IMPRESSION: Successful ultrasound-guided paracentesis yielding 1.4 liters of peritoneal fluid. PLAN: The patient has required >/=2 paracenteses in a 30 day period and a formal evaluation by the [facility]pert[facility] has been arranged. Read by Robet Leu [facility] Electronically Signed   By: Marliss Coots M.D.   On: 01/31/2022 10:31     Discharge Exam: Vitals:   02/15/22 1953 02/16/22 0358  BP: 120/62 127/71  Pulse: 82 78  Resp: 18 16  Temp: 98.1 F (36.7 C) 98 F (36.7 C)  SpO2: 100% 100%    Vitals:   02/15/22 0511 02/15/22 1207 02/15/22 1953 02/16/22 0358  BP: 130/76 131/74 120/62 127/71  Pulse: 88 83 82 78  Resp: 14 18 18 16   Temp: (!) 97 F (36.1 C) 98 F (36.7 C) 98.1 F (36.7 C) 98 F (36.7 C)  TempSrc:  Oral    SpO2: 100% 100% 100% 100%  Weight: 64.4 kg   66.6 kg  Height:        General: Pt is alert, awake, not in acute distress Cardiovascular: RRR, S1/S2 +, no rubs, no gallops Respiratory: CTA bilaterally, no wheezing, no rhonchi Abdominal: Soft, NT, slightly distended, bowel sounds + Extremities: no edema, no cyanosis    The results of significant diagnostics from this hospitalization (including imaging, microbiology, ancillary and laboratory) are listed below for reference.     Microbiology: Recent Results (from the past 240 hour(s))  Body fluid culture w Gram Stain     Status: None (Preliminary result)   Collection Time: 02/13/22  8:02 PM   Specimen: Peritoneal Cavity; Peritoneal Fluid  Result Value Ref Range Status   Specimen Description   Final    PERITONEAL CAVITY Performed at [facility], [address], [place], [place]    Special Requests   Final    NONE Performed  at [facility], [address], [place], [place] [address]    Gram Stain   Final    NO ORGANISMS SEEN RARE WBC PRESENT, PREDOMINANTLY PMN    Culture   Final    NO GROWTH 1 DAY Performed at [facility], [address], [place], [place] [address]    Report Status PENDING  Incomplete  Gram stain     Status: None   Collection Time: 02/13/22  8:02 PM   Specimen: Peritoneal Cavity  Result Value Ref Range Status   Specimen Description PERITONEAL CAVITY  Final   Special Requests NONE  Final   Gram Stain   Final    NO ORGANISMS SEEN WBC PRESENT, PREDOMINANTLY MONONUCLEAR CYTOSPIN SMEAR Performed at [facility], [address], [place], [place] [address]    Report Status 02/13/2022 FINAL  Final  Culture, body fluid w Gram Stain-bottle     Status: None  (Preliminary result)   Collection Time: 02/14/22 10:00 AM   Specimen: Peritoneal Washings  Result Value Ref Range Status   Specimen Description PERITONEAL  Final   Special Requests BOTTLES DRAWN AEROBIC AND ANAEROBIC 10CC  Final   Culture   Final    NO GROWTH 2 DAYS Performed at [facility], [address], [place], [place] [address]    Report Status PENDING  Incomplete  Gram stain     Status: None   Collection Time: 02/14/22 10:00 AM   Specimen: Peritoneal Washings  Result Value Ref Range Status   Specimen Description PERITONEAL  Final   Special Requests NONE  Final   Gram Stain   Final    NO ORGANISMS SEEN WBC PRESENT,BOTH PMN AND MONONUCLEAR CYTOSPIN SMEAR Performed at [facility], [address], [place], [place] [address]    Report Status 02/14/2022 FINAL  Final     Labs: BNP (last 3 results) No results for input(s): "BNP" in the last 8760 hours. Basic Metabolic Panel: Recent Labs  Lab 02/10/22 1723 02/11/22 0212 02/11/22 0558 02/11/22 1333 02/11/22 1335 02/13/22 1636 02/14/22 0415 02/15/22 0342 02/16/22 0529  NA 122*   < > 124*   < > 124* 124* 129* 126* 124*  K 3.4*   < > 3.6  --  3.4* 4.2 4.4 3.4* 4.1  CL 91*   < > 93*  --  95* 95* 100 99 98  CO2 23   < > 20*  --  22 20* 21* 19* 20*  GLUCOSE 118*   < > 102*  --  145* 111* 115* 126* 118*  BUN 14   < > 15  --  15 14 14 15 13   CREATININE 0.86   < > 0.78  --  0.83 0.97 0.97 0.92 0.86  CALCIUM 7.9*   < > 8.1*  --  7.6* 8.3* 8.0* 7.8* 8.2*  MG 1.8  --  1.7  --   --  1.6* 2.0 1.9  --   PHOS 4.0  --   --   --   --   --   --   --   --    < > = values in this interval not displayed.   Liver Function Tests: Recent Labs  Lab 02/11/22 0558 02/13/22 1636 02/14/22 0415 02/15/22 0342 02/16/22 0529  AST 79* 78* 62* 55* 75*  ALT 43 47* 40 36 42  ALKPHOS 89 96 89 87 97  BILITOT 5.0* 3.8* 2.6* 2.1* 2.4*  PROT 5.4* 5.9* 4.8* 4.6* 5.5*  ALBUMIN 2.8* 3.0*  2.5* 2.4* 3.0*   Recent Labs  Lab 02/10/22 1723  02/13/22 1636  LIPASE 42 42   Recent Labs  Lab 02/11/22 0558 02/13/22 1636 02/14/22 0415 02/15/22 0342 02/16/22 0529  AMMONIA 47* 101* 60* 55* 27   CBC: Recent Labs  Lab 02/10/22 1723 02/11/22 0558 02/13/22 1636 02/14/22 0415 02/15/22 0342 02/15/22 0832 02/15/22 1659  WBC 9.3 8.2 5.9 5.5 5.1 5.8 5.1  NEUTROABS 4.5 2.9 2.5 2.4 2.2  --   --   HGB 7.1* 8.4* 8.3* 7.2* 7.1* 7.8* 7.6*  HCT 22.1* 27.3* 25.6* 22.8* 22.6* 24.6* 23.9*  MCV 92.5 93.8 92.4 93.1 93.4 93.9 93.7  PLT 138* 133* 134* 122* 120* 128* 133*   Cardiac Enzymes: No results for input(s): "CKTOTAL", "CKMB", "CKMBINDEX", "TROPONINI" in the last 168 hours. BNP: Invalid input(s): "POCBNP" CBG: No results for input(s): "GLUCAP" in the last 168 hours. D-Dimer No results for input(s): "DDIMER" in the last 72 hours. Hgb A1c No results for input(s): "HGBA1C" in the last 72 hours. Lipid Profile No results for input(s): "CHOL", "HDL", "LDLCALC", "TRIG", "CHOLHDL", "LDLDIRECT" in the last 72 hours. Thyroid function studies No results for input(s): "TSH", "T4TOTAL", "T3FREE", "THYROIDAB" in the last 72 hours.  Invalid input(s): "FREET3" Anemia work up Recent Labs    02/15/22 0832  VITAMINB12 937*  FOLATE 20.9  FERRITIN 47  TIBC 225*  IRON 21*  RETICCTPCT 3.4*   Urinalysis    Component Value Date/Time   COLORURINE YELLOW 02/13/2022 1935   APPEARANCEUR HAZY (A) 02/13/2022 1935   LABSPEC 1.038 (H) 02/13/2022 1935   PHURINE 6.0 02/13/2022 1935   GLUCOSEU NEGATIVE 02/13/2022 1935   HGBUR NEGATIVE 02/13/2022 1935   BILIRUBINUR NEGATIVE 02/13/2022 1935   KETONESUR NEGATIVE 02/13/2022 1935   PROTEINUR NEGATIVE 02/13/2022 1935   UROBILINOGEN 0.2 04/10/2013 1432   NITRITE NEGATIVE 02/13/2022 1935   LEUKOCYTESUR NEGATIVE 02/13/2022 1935   Sepsis Labs Recent Labs  Lab 02/14/22 0415 02/15/22 0342 02/15/22 0832 02/15/22 1659  WBC 5.5 5.1 5.8 5.1   Microbiology Recent Results (from the past 240 hour(s))   Body fluid culture w Gram Stain     Status: None (Preliminary result)   Collection Time: 02/13/22  8:02 PM   Specimen: Peritoneal Cavity; Peritoneal Fluid  Result Value Ref Range Status   Specimen Description   Final    PERITONEAL CAVITY Performed at [facility], [address] [place], [place] [address]    Special Requests   Final    NONE Performed at [facility], [address] [place], [place] [address]    Gram Stain   Final    NO ORGANISMS SEEN RARE WBC PRESENT, PREDOMINANTLY PMN    Culture   Final    NO GROWTH 1 DAY Performed at [place] [address], [place], [place] [address]    Report Status PENDING  Incomplete  Gram stain     Status: None   Collection Time: 02/13/22  8:02 PM   Specimen: Peritoneal Cavity  Result Value Ref Range Status   Specimen Description PERITONEAL CAVITY  Final   Special Requests NONE  Final   Gram Stain   Final    NO ORGANISMS SEEN WBC PRESENT, PREDOMINANTLY MONONUCLEAR CYTOSPIN SMEAR Performed at [facility], [address], [place], [place] [address]    Report Status 02/13/2022 FINAL  Final  Culture, body fluid w Gram Stain-bottle     Status: None (Preliminary result)   Collection Time: 02/14/22 10:00 AM   Specimen: Peritoneal  Washings  Result Value Ref Range Status   Specimen Description PERITONEAL  Final   Special Requests BOTTLES DRAWN AEROBIC AND ANAEROBIC 10CC  Final   Culture   Final    NO GROWTH 2 DAYS Performed at [facility], [address] [place], [place] [address]    Report Status PENDING  Incomplete  Gram stain     Status: None   Collection Time: 02/14/22 10:00 AM   Specimen: Peritoneal Washings  Result Value Ref Range Status   Specimen Description PERITONEAL  Final   Special Requests NONE  Final   Gram Stain   Final    NO ORGANISMS SEEN WBC PRESENT,BOTH PMN AND MONONUCLEAR CYTOSPIN SMEAR Performed at [facility], [address] [place], [place] [address]    Report Status 02/14/2022 FINAL  Final      Time coordinating discharge: Over 30 minutes  SIGNED:   Darliss Cheney, MD  [facility] 02/16/2022, 11:31 AM *Please note that this is a verbal dictation therefore any spelling or grammatical errors are due to the "[place] [employer]" system interpretation. If 7PM-7AM, please contact night-coverage [email or web address]

## 2022-02-17 LAB — BODY FLUID CULTURE W GRAM STAIN
Culture: NO GROWTH
Gram Stain: NONE SEEN

## 2022-02-19 ENCOUNTER — Inpatient Hospital Stay: Payer: Medicaid Other | Admitting: Gastroenterology

## 2022-02-19 NOTE — Progress Notes (Deleted)
GI Office Note    Referring Provider: Neale Burly, MD Primary Care Physician:  Neale Burly, MD Primary Gastroenterologist: Cristopher Estimable.Rourk, MD  Date:  02/19/2022  ID:  Erica Banks, DOB Jan 25, 1981, MRN [id number]   Chief Complaint   No chief complaint on file.    History of Present Illness  Legacy MI CHAP is a 42 y.o. 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   Last office visit 02/05/2022.  She reported having some rectal bleeding although she does have a history of engorged hemorrhoids and IDA requiring iron infusions.  Reported not taking nonselective beta-blocker due to labile blood pressure.  Patient presents feeling like she needs another paracentesis due to abdominal distention.  Weight noted to be down 16 pounds since November 2023.  Reportedly taking furosemide and spironolactone as directed.  Noted to have seen transplant hepatology Roosevelt Locks, NP).  MELD not high enough to initiate transplant evaluation.  She is scheduled for an ASAP paracentesis with albumin with appropriate labs.  Also advised to update CMP, CBC, INR, AFP.  Labs 02/05/2022: Hemoglobin 9.2, platelets 137, INR 1.4, sodium 127, creatinine 0.78, AST 68, ALT 35, T. bili 4.1.  aFP 6.1. MELD sodium 16.  Ultrasound ascites 02/05/2022: Minimal to mild amount of ascites noted, not enough fluid pocket for paracentesis.  Since her last visit she has had 2 hospitalizations for hyponatremia and hepatic encephalopathy.  She underwent ultrasound paracentesis 02/11/2022 yielding 4.4 L peritoneal fluid and subsequent paracentesis on 02/14/2022 yielding 5 L peritoneal fluid.  CT A/P 02/13/2022: -Cirrhosis with portal venous hypertension, splenomegaly, and moderate to large amount of ascites -Continue wall thickening loops of duodenum, jejunum, right colon favoring portal intra  colopathy -Possible wall thickening in the antrum of the stomach, cannot exclude enteritis -Upper abdominal midline ventral hernia containing adipose tissue -Subcutaneous edema along the right flank and anterior abdominal wall inferiorly tracking along lateral upper thigh region -Heterogeneous splenic enhancement probably contrast phase related -Reactive porta hepatitis and peripancreatic lymph nodes  Patient discharged 02/16/2022.  Ammonia level normalized at discharge.  Hemoglobin remained baseline between 8-9.  No transfusion given.  Nutrition encouraged while inpatient given hypoalbuminemia.  She was discharged on lactulose 20 g 3 times daily, Lasix 40 mg daily, spironolactone 100 mg daily, pantoprazole 40 mg twice daily.  Has an appointment with hepatology in March 2024.      Current Outpatient Medications  Medication Sig Dispense Refill   CONSTULOSE 10 GM/15ML solution Take 30 mLs (20 g total) by mouth 3 (three) times daily. 236 mL 0   furosemide (LASIX) 40 MG tablet TAKE 1 TABLET BY MOUTH DAILY 30 tablet 3   gabapentin (NEURONTIN) 300 MG capsule Take 300 mg by mouth 3 (three) times daily as needed (nerve pain).     hydrOXYzine (VISTARIL) 25 MG capsule Take 25 mg by mouth 3 (three) times daily as needed for anxiety.     methocarbamol (ROBAXIN) 500 MG tablet Take 500 mg by mouth at bedtime as needed for muscle spasms.     metoCLOPramide (REGLAN) 5 MG tablet Take 5 mg by mouth 2 (two) times daily as needed.     ondansetron (ZOFRAN-ODT) 4 MG disintegrating tablet Take 1 tablet (4 mg total) by mouth every 8 (eight) hours as needed for nausea or vomiting. 12 tablet 0   pantoprazole (PROTONIX) 40 MG tablet Take 1 tablet (40  mg total) by mouth 2 (two) times daily before a meal. (Patient taking differently: Take 40 mg by mouth daily.) 60 tablet 3   spironolactone (ALDACTONE) 100 MG tablet TAKE 1 TABLET BY MOUTH DAILY 30 tablet 3   No current facility-administered medications for this visit.     Past Medical History:  Diagnosis Date   Anemia    Cirrhosis ([place])    DVT (deep venous thrombosis) (HCC)    Low iron    Neuropathy    Neuropathy    Panic attacks    Pulmonary embolus ([place]) 2019    Past Surgical History:  Procedure Laterality Date   ABDOMINAL HYSTERECTOMY     BIOPSY N/A 06/24/2013   Procedure: BIOPSY TERMINAL ILEUM;  Surgeon: Daneil Dolin, MD;  [facility];  Service: Endoscopy;  Laterality: N/A;   CHOLECYSTECTOMY     COLONOSCOPY N/A 06/24/2013   [id number] hemorrhoids/otherwise normal    COLONOSCOPY WITH PROPOFOL N/A 05/31/2021   Surgeon: Daneil Dolin, MD; ery large nonbleeding internal and external hemorrhoids, portal colopathy.  Recommended repeat colonoscopy in 10 years.   ESOPHAGOGASTRODUODENOSCOPY (EGD) WITH PROPOFOL N/A 03/29/2021   Surgeon: Eloise Harman, DO;  grade 1 esophageal varices, portal hypertensive gastropathy with friable tissue with spontaneous ooze, normal duodenum.   ESOPHAGOGASTRODUODENOSCOPY (EGD) WITH PROPOFOL N/A 05/31/2021   Surgeon: Daneil Dolin, MD;  grade 1-2 esophageal varices without bleeding stigmata, advanced appearing portal hypertensive gastropathy.   ESOPHAGOGASTRODUODENOSCOPY (EGD) WITH PROPOFOL N/A 12/22/2021   Procedure: ESOPHAGOGASTRODUODENOSCOPY (EGD) WITH PROPOFOL;  Surgeon: Harvel Quale, MD;  [facility];  Service: Gastroenterology;  Laterality: N/A;   nasal bone surgery     NASAL SINUS SURGERY     SINUS IRRIGATION     TONSILLECTOMY     TUBAL LIGATION      Family History  Problem Relation Age of Onset   Diabetes Mother    Hypertension Mother    COPD Mother    Cirrhosis Father        ETOH   Diabetes Sister    Hypertension Sister    Crohn's disease Maternal Grandmother    Cancer Other    Colon cancer Neg Hx    Autoimmune disease Neg Hx     Allergies as of 02/19/2022 - Review Complete 02/14/2022  Allergen Reaction Noted   Demerol Anaphylaxis 10/12/2010     Social History   Socioeconomic History   Marital status: Married    Spouse name: Not on file   Number of children: Not on file   Years of education: Not on file   Highest education level: Not on file  Occupational History   Occupation: Unemployed    Comment: wants to go to nursing school in fall 2015  Tobacco Use   Smoking status: Every Day    Packs/day: 0.50    Years: 12.00    Total pack years: 6.00    Types: Cigarettes   Smokeless tobacco: Never  Vaping Use   Vaping Use: Never used  Substance and Sexual Activity   Alcohol use: Not Currently    Alcohol/week: 4.0 - 5.0 standard drinks of alcohol    Types: 4 - 5 Cans of beer per week    Comment: None since February 2023.   Drug use: Yes    Types: Marijuana    Comment: occasional   Sexual activity: Yes    Birth control/protection: Surgical  Other Topics Concern   Not on file  Social History Narrative  Not on file   Social Determinants of Health   Financial Resource Strain: Not on file  Food Insecurity: No Food Insecurity (02/13/2022)   Hunger Vital Sign    Worried About Running Out of Food in the Last Year: Never true    Ran Out of Food in the Last Year: Never true  Transportation Needs: No Transportation Needs (02/13/2022)   PRAPARE - Hydrologist (Medical): No    Lack of Transportation (Non-Medical): No  Physical Activity: Not on file  Stress: Not on file  Social Connections: Not on file     Review of Systems   Gen: Denies fever, chills, anorexia. Denies fatigue, weakness, weight loss.  CV: Denies chest pain, palpitations, syncope, peripheral edema, and claudication. Resp: Denies dyspnea at rest, cough, wheezing, coughing up blood, and pleurisy. GI: See HPI Derm: Denies rash, itching, dry skin Psych: Denies depression, anxiety, memory loss, confusion. No homicidal or suicidal ideation.  Heme: Denies bruising, bleeding, and enlarged lymph nodes.   Physical Exam   LMP   (LMP Unknown)   General:   Alert and oriented. No distress noted. Pleasant and cooperative.  Head:  Normocephalic and atraumatic. Eyes:  Conjuctiva clear without scleral icterus. Mouth:  Oral mucosa pink and moist. Good dentition. No lesions. Lungs:  Clear to auscultation bilaterally. No wheezes, rales, or rhonchi. No distress.  Heart:  S1, S2 present without murmurs appreciated.  Abdomen:  +BS, soft, non-tender and non-distended. No rebound or guarding. No HSM or masses noted. Rectal: *** Msk:  Symmetrical without gross deformities. Normal posture. Extremities:  Without edema. Neurologic:  Alert and  oriented x4 Psych:  Alert and cooperative. Normal mood and affect.   Assessment  Erica Banks is a 42 y.o. female with a history of *** presenting today with   Cirrhosis:   Anemia:   PLAN   *** Referral to portal hypertension clinic     Venetia Night, MSN, FNP-BC, AGACNP-BC [facility]

## 2022-02-20 LAB — CULTURE, BODY FLUID W GRAM STAIN -BOTTLE: Culture: NO GROWTH

## 2022-02-22 ENCOUNTER — Telehealth: Payer: Self-pay

## 2022-02-22 NOTE — Telephone Encounter (Signed)
Pt is currently admitted and called stating that she has been given Xifaxan while there. Pt is requesting a formal Rx to be sent to her pharmacy.

## 2022-02-28 ENCOUNTER — Telehealth: Payer: Self-pay

## 2022-02-28 NOTE — Progress Notes (Addendum)
Referring Provider: Neale Burly, MD Primary Care Physician:  Neale Burly, MD Primary GI Physician: Dr. Gala Romney  No chief complaint on file.   HPI:   Erica Banks is a 42 y.o. 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  Also with history of IDA following with hematology with prescription iron infusions, GERD, PE, DVT, neuropathy.  She was initially presenting today for hospital follow-up and to discuss scheduling paracentesis. However, now with mental status changes.   Patient was brought into our office in a wheel chair. Nurse noted patient looked very ill and I was called. We brought her back to the exam room and got a set of vitals which are documented below. Vitals were stable. She is clearly encephalopathic, rambling random things, unable to follow commands, will not look me in the eye, and unable to answer any questions. Family states she woke up this way today. Advised that patient needed to be in the emergency room and that I would call EMS for transport, but family that was with patient requested to take patient by personal vehicle.   We did not continue with an office visit as patient needed emergency medical attention.    Past Medical History:  Diagnosis Date   Anemia    Cirrhosis ([place])    DVT (deep venous thrombosis) (HCC)    Low iron    Neuropathy    Neuropathy    Panic attacks    Pulmonary embolus ([place]) 2019    Past Surgical History:  Procedure Laterality Date   ABDOMINAL HYSTERECTOMY     BIOPSY N/A 06/24/2013   Procedure: BIOPSY TERMINAL ILEUM;  Surgeon: Daneil Dolin, MD;  [facility];  Service: Endoscopy;  Laterality: N/A;   CHOLECYSTECTOMY     COLONOSCOPY N/A 06/24/2013   [id number] hemorrhoids/otherwise normal    COLONOSCOPY WITH PROPOFOL N/A 05/31/2021   Surgeon: Daneil Dolin, MD; ery large nonbleeding internal and  external hemorrhoids, portal colopathy.  Recommended repeat colonoscopy in 10 years.   ESOPHAGOGASTRODUODENOSCOPY (EGD) WITH PROPOFOL N/A 03/29/2021   Surgeon: Eloise Harman, DO;  grade 1 esophageal varices, portal hypertensive gastropathy with friable tissue with spontaneous ooze, normal duodenum.   ESOPHAGOGASTRODUODENOSCOPY (EGD) WITH PROPOFOL N/A 05/31/2021   Surgeon: Daneil Dolin, MD;  grade 1-2 esophageal varices without bleeding stigmata, advanced appearing portal hypertensive gastropathy.   ESOPHAGOGASTRODUODENOSCOPY (EGD) WITH PROPOFOL N/A 12/22/2021   Procedure: ESOPHAGOGASTRODUODENOSCOPY (EGD) WITH PROPOFOL;  Surgeon: Harvel Quale, MD;  [facility];  Service: Gastroenterology;  Laterality: N/A;   nasal bone surgery     NASAL SINUS SURGERY     SINUS IRRIGATION     TONSILLECTOMY     TUBAL LIGATION      Current Outpatient Medications  Medication Sig Dispense Refill   CONSTULOSE 10 GM/15ML solution Take 30 mLs (20 g total) by mouth 3 (three) times daily. 236 mL 0   furosemide (LASIX) 40 MG tablet TAKE 1 TABLET BY MOUTH DAILY 30 tablet 3   gabapentin (NEURONTIN) 300 MG capsule Take 300 mg by mouth 3 (three) times daily as needed (nerve pain).     hydrOXYzine (VISTARIL) 25 MG capsule Take 25 mg by mouth 3 (three) times daily as needed for anxiety.     methocarbamol (ROBAXIN) 500 MG tablet Take 500 mg by mouth at bedtime as needed for muscle spasms.     metoCLOPramide (REGLAN) 5 MG  tablet Take 5 mg by mouth 2 (two) times daily as needed.     ondansetron (ZOFRAN-ODT) 4 MG disintegrating tablet Take 1 tablet (4 mg total) by mouth every 8 (eight) hours as needed for nausea or vomiting. 12 tablet 0   pantoprazole (PROTONIX) 40 MG tablet Take 1 tablet (40 mg total) by mouth 2 (two) times daily before a meal. (Patient taking differently: Take 40 mg by mouth daily.) 60 tablet 3   spironolactone (ALDACTONE) 100 MG tablet TAKE 1 TABLET BY MOUTH DAILY 30 tablet 3    No current facility-administered medications for this visit.    Allergies as of 03/01/2022 - Review Complete 03/01/2022  Allergen Reaction Noted   Demerol Anaphylaxis 10/12/2010    Family History  Problem Relation Age of Onset   Diabetes Mother    Hypertension Mother    COPD Mother    Cirrhosis Father        ETOH   Diabetes Sister    Hypertension Sister    Crohn's disease Maternal Grandmother    Cancer Other    Colon cancer Neg Hx    Autoimmune disease Neg Hx     Social History   Socioeconomic History   Marital status: Married    Spouse name: Not on file   Number of children: Not on file   Years of education: Not on file   Highest education level: Not on file  Occupational History   Occupation: Unemployed    Comment: wants to go to nursing school in fall 2015  Tobacco Use   Smoking status: Every Day    Packs/day: 0.50    Years: 12.00    Total pack years: 6.00    Types: Cigarettes   Smokeless tobacco: Never  Vaping Use   Vaping Use: Never used  Substance and Sexual Activity   Alcohol use: Not Currently    Alcohol/week: 4.0 - 5.0 standard drinks of alcohol    Types: 4 - 5 Cans of beer per week    Comment: None since February 2023.   Drug use: Yes    Types: Marijuana    Comment: occasional   Sexual activity: Yes    Birth control/protection: Surgical  Other Topics Concern   Not on file  Social History Narrative   Not on file   Social Determinants of Health   Financial Resource Strain: Not on file  Food Insecurity: No Food Insecurity (02/13/2022)   Hunger Vital Sign    Worried About Running Out of Food in the Last Year: Never true    Ran Out of Food in the Last Year: Never true  Transportation Needs: No Transportation Needs (02/13/2022)   PRAPARE - Hydrologist (Medical): No    Lack of Transportation (Non-Medical): No  Physical Activity: Not on file  Stress: Not on file  Social Connections: Not on file   ROS:  Unable  to assess due to altered mental status.   Physical Exam: BP 126/62 (BP Location: Left Arm, Patient Position: Sitting, Cuff Size: Normal)   Pulse 94   Temp 97.9 F (36.6 C) (Temporal)   LMP  (LMP Unknown)   SpO2 98%  General:   Alert, but altered. Unable to follow commands. Restless. In a wheel chair. Ill appearing.     Assessment:  42 y.o. female with history of alcoholic cirrhosis complicated by recurrent ascites, SBP, hepatic encephalopathy, esophageal varices, who was originally scheduled for a hospital follow-up; however, patient presented with hepatic encephalopathy and  was advised she needed emergency medical attention immediately. We obtained a set of vitals which were stable. Otherwise, office visit was not completed. Recommended EMS transport, but family wanted to take patient by personal vehicle.     Plan:  To [facility] Emergency Department. Our team will be consulted once admitted.  Follow-up after acute illness is addressed.    Aliene Altes, PA-C [facility] 03/01/2022

## 2022-02-28 NOTE — Telephone Encounter (Signed)
Pt called stating that she is needing orders for a paracentesis. Pt was discharged from [facility] on 02/23/2022.

## 2022-02-28 NOTE — Telephone Encounter (Signed)
Pt was made aware and was scheduled with Kristen on 03/01/2022.

## 2022-03-01 ENCOUNTER — Encounter (HOSPITAL_COMMUNITY): Payer: Self-pay

## 2022-03-01 ENCOUNTER — Emergency Department (HOSPITAL_COMMUNITY): Payer: Medicaid Other

## 2022-03-01 ENCOUNTER — Ambulatory Visit (HOSPITAL_COMMUNITY): Admission: RE | Admit: 2022-03-01 | Payer: Medicaid Other | Source: Ambulatory Visit

## 2022-03-01 ENCOUNTER — Ambulatory Visit (INDEPENDENT_AMBULATORY_CARE_PROVIDER_SITE_OTHER): Payer: Medicaid Other | Admitting: Gastroenterology

## 2022-03-01 ENCOUNTER — Inpatient Hospital Stay (HOSPITAL_COMMUNITY)
Admission: EM | Admit: 2022-03-01 | Discharge: 2022-03-18 | DRG: 870 | Disposition: A | Discharge status: Home / Self Care | Payer: Medicaid Other | Attending: Internal Medicine | Admitting: Internal Medicine

## 2022-03-01 ENCOUNTER — Encounter (HOSPITAL_COMMUNITY): Payer: Self-pay | Admitting: Emergency Medicine

## 2022-03-01 ENCOUNTER — Inpatient Hospital Stay (HOSPITAL_COMMUNITY): Payer: Medicaid Other

## 2022-03-01 VITALS — BP 126/62 | HR 94 | Temp 97.9°F

## 2022-03-01 DIAGNOSIS — B9689 Other specified bacterial agents as the cause of diseases classified elsewhere: Secondary | ICD-10-CM | POA: Diagnosis not present

## 2022-03-01 DIAGNOSIS — F10988 Alcohol use, unspecified with other alcohol-induced disorder: Secondary | ICD-10-CM | POA: Diagnosis not present

## 2022-03-01 DIAGNOSIS — M6250 Muscle wasting and atrophy, not elsewhere classified, unspecified site: Secondary | ICD-10-CM | POA: Diagnosis present

## 2022-03-01 DIAGNOSIS — Z86711 Personal history of pulmonary embolism: Secondary | ICD-10-CM

## 2022-03-01 DIAGNOSIS — R197 Diarrhea, unspecified: Secondary | ICD-10-CM | POA: Diagnosis present

## 2022-03-01 DIAGNOSIS — K766 Portal hypertension: Secondary | ICD-10-CM | POA: Diagnosis present

## 2022-03-01 DIAGNOSIS — F05 Delirium due to known physiological condition: Secondary | ICD-10-CM | POA: Diagnosis not present

## 2022-03-01 DIAGNOSIS — K7031 Alcoholic cirrhosis of liver with ascites: Secondary | ICD-10-CM | POA: Diagnosis present

## 2022-03-01 DIAGNOSIS — E43 Unspecified severe protein-calorie malnutrition: Secondary | ICD-10-CM | POA: Insufficient documentation

## 2022-03-01 DIAGNOSIS — R188 Other ascites: Secondary | ICD-10-CM

## 2022-03-01 DIAGNOSIS — F29 Unspecified psychosis not due to a substance or known physiological condition: Secondary | ICD-10-CM | POA: Diagnosis not present

## 2022-03-01 DIAGNOSIS — K767 Hepatorenal syndrome: Secondary | ICD-10-CM | POA: Diagnosis present

## 2022-03-01 DIAGNOSIS — E870 Hyperosmolality and hypernatremia: Secondary | ICD-10-CM | POA: Diagnosis present

## 2022-03-01 DIAGNOSIS — R112 Nausea with vomiting, unspecified: Secondary | ICD-10-CM | POA: Diagnosis present

## 2022-03-01 DIAGNOSIS — Z825 Family history of asthma and other chronic lower respiratory diseases: Secondary | ICD-10-CM

## 2022-03-01 DIAGNOSIS — E872 Acidosis, unspecified: Secondary | ICD-10-CM | POA: Diagnosis not present

## 2022-03-01 DIAGNOSIS — K7460 Unspecified cirrhosis of liver: Secondary | ICD-10-CM

## 2022-03-01 DIAGNOSIS — A419 Sepsis, unspecified organism: Principal | ICD-10-CM | POA: Diagnosis present

## 2022-03-01 DIAGNOSIS — F1010 Alcohol abuse, uncomplicated: Secondary | ICD-10-CM | POA: Diagnosis present

## 2022-03-01 DIAGNOSIS — N179 Acute kidney failure, unspecified: Secondary | ICD-10-CM | POA: Diagnosis present

## 2022-03-01 DIAGNOSIS — D638 Anemia in other chronic diseases classified elsewhere: Secondary | ICD-10-CM | POA: Diagnosis present

## 2022-03-01 DIAGNOSIS — Z6820 Body mass index (BMI) 20.0-20.9, adult: Secondary | ICD-10-CM

## 2022-03-01 DIAGNOSIS — Z86718 Personal history of other venous thrombosis and embolism: Secondary | ICD-10-CM

## 2022-03-01 DIAGNOSIS — G629 Polyneuropathy, unspecified: Secondary | ICD-10-CM | POA: Diagnosis present

## 2022-03-01 DIAGNOSIS — Z1612 Extended spectrum beta lactamase (ESBL) resistance: Secondary | ICD-10-CM | POA: Diagnosis present

## 2022-03-01 DIAGNOSIS — Z833 Family history of diabetes mellitus: Secondary | ICD-10-CM

## 2022-03-01 DIAGNOSIS — J9601 Acute respiratory failure with hypoxia: Secondary | ICD-10-CM

## 2022-03-01 DIAGNOSIS — Z79899 Other long term (current) drug therapy: Secondary | ICD-10-CM

## 2022-03-01 DIAGNOSIS — N390 Urinary tract infection, site not specified: Secondary | ICD-10-CM | POA: Diagnosis present

## 2022-03-01 DIAGNOSIS — G928 Other toxic encephalopathy: Secondary | ICD-10-CM | POA: Diagnosis present

## 2022-03-01 DIAGNOSIS — R6520 Severe sepsis without septic shock: Secondary | ICD-10-CM | POA: Diagnosis not present

## 2022-03-01 DIAGNOSIS — F410 Panic disorder [episodic paroxysmal anxiety] without agoraphobia: Secondary | ICD-10-CM | POA: Diagnosis present

## 2022-03-01 DIAGNOSIS — R1313 Dysphagia, pharyngeal phase: Secondary | ICD-10-CM | POA: Diagnosis not present

## 2022-03-01 DIAGNOSIS — Y828 Other medical devices associated with adverse incidents: Secondary | ICD-10-CM | POA: Diagnosis not present

## 2022-03-01 DIAGNOSIS — I8510 Secondary esophageal varices without bleeding: Secondary | ICD-10-CM | POA: Diagnosis present

## 2022-03-01 DIAGNOSIS — D689 Coagulation defect, unspecified: Secondary | ICD-10-CM | POA: Diagnosis present

## 2022-03-01 DIAGNOSIS — R451 Restlessness and agitation: Secondary | ICD-10-CM | POA: Diagnosis not present

## 2022-03-01 DIAGNOSIS — B961 Klebsiella pneumoniae [K. pneumoniae] as the cause of diseases classified elsewhere: Secondary | ICD-10-CM | POA: Diagnosis present

## 2022-03-01 DIAGNOSIS — Z91148 Patient's other noncompliance with medication regimen for other reason: Secondary | ICD-10-CM

## 2022-03-01 DIAGNOSIS — F17210 Nicotine dependence, cigarettes, uncomplicated: Secondary | ICD-10-CM | POA: Diagnosis present

## 2022-03-01 DIAGNOSIS — K7030 Alcoholic cirrhosis of liver without ascites: Secondary | ICD-10-CM | POA: Diagnosis not present

## 2022-03-01 DIAGNOSIS — E860 Dehydration: Secondary | ICD-10-CM | POA: Diagnosis present

## 2022-03-01 DIAGNOSIS — K7682 Hepatic encephalopathy: Secondary | ICD-10-CM | POA: Diagnosis present

## 2022-03-01 DIAGNOSIS — R64 Cachexia: Secondary | ICD-10-CM | POA: Diagnosis not present

## 2022-03-01 DIAGNOSIS — R4701 Aphasia: Secondary | ICD-10-CM | POA: Diagnosis not present

## 2022-03-01 DIAGNOSIS — K7290 Hepatic failure, unspecified without coma: Secondary | ICD-10-CM

## 2022-03-01 DIAGNOSIS — S1983XA Other specified injuries of vocal cord, initial encounter: Secondary | ICD-10-CM | POA: Diagnosis not present

## 2022-03-01 DIAGNOSIS — K3189 Other diseases of stomach and duodenum: Secondary | ICD-10-CM | POA: Diagnosis present

## 2022-03-01 DIAGNOSIS — Z8249 Family history of ischemic heart disease and other diseases of the circulatory system: Secondary | ICD-10-CM

## 2022-03-01 DIAGNOSIS — K648 Other hemorrhoids: Secondary | ICD-10-CM | POA: Diagnosis present

## 2022-03-01 DIAGNOSIS — E876 Hypokalemia: Secondary | ICD-10-CM | POA: Diagnosis present

## 2022-03-01 DIAGNOSIS — K922 Gastrointestinal hemorrhage, unspecified: Secondary | ICD-10-CM

## 2022-03-01 DIAGNOSIS — Z9911 Dependence on respirator [ventilator] status: Secondary | ICD-10-CM

## 2022-03-01 DIAGNOSIS — R140 Abdominal distension (gaseous): Secondary | ICD-10-CM | POA: Diagnosis present

## 2022-03-01 DIAGNOSIS — E8809 Other disorders of plasma-protein metabolism, not elsewhere classified: Secondary | ICD-10-CM | POA: Diagnosis not present

## 2022-03-01 DIAGNOSIS — Z888 Allergy status to other drugs, medicaments and biological substances status: Secondary | ICD-10-CM

## 2022-03-01 DIAGNOSIS — I8511 Secondary esophageal varices with bleeding: Secondary | ICD-10-CM | POA: Diagnosis not present

## 2022-03-01 DIAGNOSIS — E871 Hypo-osmolality and hyponatremia: Secondary | ICD-10-CM | POA: Diagnosis present

## 2022-03-01 DIAGNOSIS — E861 Hypovolemia: Secondary | ICD-10-CM | POA: Diagnosis present

## 2022-03-01 DIAGNOSIS — K2289 Other specified disease of esophagus: Secondary | ICD-10-CM | POA: Diagnosis not present

## 2022-03-01 DIAGNOSIS — Z781 Physical restraint status: Secondary | ICD-10-CM

## 2022-03-01 DIAGNOSIS — K644 Residual hemorrhoidal skin tags: Secondary | ICD-10-CM | POA: Diagnosis present

## 2022-03-01 DIAGNOSIS — R402432 Glasgow coma scale score 3-8, at arrival to emergency department: Secondary | ICD-10-CM

## 2022-03-01 DIAGNOSIS — G9340 Encephalopathy, unspecified: Secondary | ICD-10-CM

## 2022-03-01 LAB — COMPREHENSIVE METABOLIC PANEL
ALT: 51 U/L — ABNORMAL HIGH (ref 0–44)
AST: 115 U/L — ABNORMAL HIGH (ref 15–41)
Albumin: 2.9 g/dL — ABNORMAL LOW (ref 3.5–5.0)
Alkaline Phosphatase: 131 U/L — ABNORMAL HIGH (ref 38–126)
Anion gap: 14 (ref 5–15)
BUN: 23 mg/dL — ABNORMAL HIGH (ref 6–20)
CO2: 14 mmol/L — ABNORMAL LOW (ref 22–32)
Calcium: 8.1 mg/dL — ABNORMAL LOW (ref 8.9–10.3)
Chloride: 102 mmol/L (ref 98–111)
Creatinine, Ser: 1.55 mg/dL — ABNORMAL HIGH (ref 0.44–1.00)
GFR, Estimated: 43 mL/min — ABNORMAL LOW (ref >60)
Glucose, Bld: 146 mg/dL — ABNORMAL HIGH (ref 70–99)
Potassium: 4.1 mmol/L (ref 3.5–5.1)
Sodium: 130 mmol/L — ABNORMAL LOW (ref 135–145)
Total Bilirubin: 2.5 mg/dL — ABNORMAL HIGH (ref 0.3–1.2)
Total Protein: 5.8 g/dL — ABNORMAL LOW (ref 6.5–8.1)

## 2022-03-01 LAB — BLOOD GAS, VENOUS
Acid-base deficit: 4.9 mmol/L — ABNORMAL HIGH (ref 0.0–2.0)
Bicarbonate: 17.9 mmol/L — ABNORMAL LOW (ref 20.0–28.0)
Drawn by: 7478
O2 Saturation: 73 %
Patient temperature: 36.6
pCO2, Ven: 27 mmHg — ABNORMAL LOW (ref 44–60)
pH, Ven: 7.44 — ABNORMAL HIGH (ref 7.25–7.43)
pO2, Ven: 41 mmHg (ref 32–45)

## 2022-03-01 LAB — PROTIME-INR
INR: 1.6 — ABNORMAL HIGH (ref 0.8–1.2)
Prothrombin Time: 18.8 seconds — ABNORMAL HIGH (ref 11.4–15.2)

## 2022-03-01 LAB — BLOOD GAS, ARTERIAL
Acid-base deficit: 7.3 mmol/L — ABNORMAL HIGH (ref 0.0–2.0)
Acid-base deficit: 6.6 mmol/L — ABNORMAL HIGH (ref 0.0–2.0)
Bicarbonate: 19.3 mmol/L — ABNORMAL LOW (ref 20.0–28.0)
Bicarbonate: 18.1 mmol/L — ABNORMAL LOW (ref 20.0–28.0)
Drawn by: 1548
Drawn by: 22179
O2 Saturation: 99.9 %
O2 Saturation: 100 %
Patient temperature: 35.3
Patient temperature: 35.5
pCO2 arterial: 40 mmHg (ref 32–48)
pCO2 arterial: 30 mmHg — ABNORMAL LOW (ref 32–48)
pH, Arterial: 7.28 — ABNORMAL LOW (ref 7.35–7.45)
pH, Arterial: 7.38 (ref 7.35–7.45)
pO2, Arterial: 126 mmHg — ABNORMAL HIGH (ref 83–108)
pO2, Arterial: 160 mmHg — ABNORMAL HIGH (ref 83–108)

## 2022-03-01 LAB — CBC
HCT: 28.4 % — ABNORMAL LOW (ref 36.0–46.0)
HCT: 27.6 % — ABNORMAL LOW (ref 36.0–46.0)
Hemoglobin: 9.2 g/dL — ABNORMAL LOW (ref 12.0–15.0)
Hemoglobin: 8.8 g/dL — ABNORMAL LOW (ref 12.0–15.0)
MCH: 29.4 pg (ref 26.0–34.0)
MCH: 29.2 pg (ref 26.0–34.0)
MCHC: 32.4 g/dL (ref 30.0–36.0)
MCHC: 31.9 g/dL (ref 30.0–36.0)
MCV: 90.7 fL (ref 80.0–100.0)
MCV: 91.7 fL (ref 80.0–100.0)
Platelets: 186 K/uL (ref 150–400)
Platelets: 192 10*3/uL (ref 150–400)
RBC: 3.13 MIL/uL — ABNORMAL LOW (ref 3.87–5.11)
RBC: 3.01 MIL/uL — ABNORMAL LOW (ref 3.87–5.11)
RDW: 16.8 % — ABNORMAL HIGH (ref 11.5–15.5)
RDW: 16.9 % — ABNORMAL HIGH (ref 11.5–15.5)
WBC: 13.6 K/uL — ABNORMAL HIGH (ref 4.0–10.5)
WBC: 19.7 10*3/uL — ABNORMAL HIGH (ref 4.0–10.5)
nRBC: 0 % (ref 0.0–0.2)
nRBC: 0 % (ref 0.0–0.2)

## 2022-03-01 LAB — URINALYSIS, W/ REFLEX TO CULTURE (INFECTION SUSPECTED)
Bilirubin Urine: NEGATIVE
Glucose, UA: NEGATIVE mg/dL
Hgb urine dipstick: NEGATIVE
Ketones, ur: NEGATIVE mg/dL
Leukocytes,Ua: NEGATIVE
Nitrite: NEGATIVE
Protein, ur: NEGATIVE mg/dL
Specific Gravity, Urine: 1.01 (ref 1.005–1.030)
pH: 6 (ref 5.0–8.0)

## 2022-03-01 LAB — BODY FLUID CELL COUNT WITH DIFFERENTIAL
Eos, Fluid: 0 %
Lymphs, Fluid: 63 %
Monocyte-Macrophage-Serous Fluid: 13 % — ABNORMAL LOW (ref 50–90)
Neutrophil Count, Fluid: 24 % (ref 0–25)
Total Nucleated Cell Count, Fluid: 87 cu mm (ref 0–1000)

## 2022-03-01 LAB — RAPID URINE DRUG SCREEN, HOSP PERFORMED
Amphetamines: NOT DETECTED
Barbiturates: NOT DETECTED
Benzodiazepines: POSITIVE — AB
Cocaine: NOT DETECTED
Opiates: NOT DETECTED
Tetrahydrocannabinol: POSITIVE — AB

## 2022-03-01 LAB — CREATININE, SERUM
Creatinine, Ser: 1.88 mg/dL — ABNORMAL HIGH (ref 0.44–1.00)
GFR, Estimated: 34 mL/min — ABNORMAL LOW (ref >60)

## 2022-03-01 LAB — ETHANOL: Alcohol, Ethyl (B): <10 mg/dL (ref <10)

## 2022-03-01 LAB — AMMONIA: Ammonia: 289 umol/L — ABNORMAL HIGH (ref 9–35)

## 2022-03-01 LAB — GLUCOSE, CAPILLARY
Glucose-Capillary: 95 mg/dL (ref 70–99)
Glucose-Capillary: 125 mg/dL — ABNORMAL HIGH (ref 70–99)

## 2022-03-01 LAB — MRSA NEXT GEN BY PCR, NASAL: MRSA by PCR Next Gen: NOT DETECTED

## 2022-03-01 MED ORDER — RIFAXIMIN 550 MG PO TABS
550.0000 mg | ORAL_TABLET | Freq: Two times a day (BID) | ORAL | Status: DC
  Administered 2022-03-01 – 2022-03-15 (×27): 550 mg
  Filled 2022-03-01 (×27): qty 1

## 2022-03-01 MED ORDER — LACTULOSE 10 GM/15ML PO SOLN
30.0000 g | Freq: Four times a day (QID) | ORAL | Status: DC
  Administered 2022-03-01 – 2022-03-02 (×3): 30 g
  Filled 2022-03-01 (×2): qty 45
  Filled 2022-03-01: qty 60

## 2022-03-01 MED ORDER — FAMOTIDINE 20 MG PO TABS: 20.0000 mg | ORAL_TABLET | Freq: Two times a day (BID) | ORAL | Status: DC

## 2022-03-01 MED ORDER — ORAL CARE MOUTH RINSE: 15.0000 mL | OROMUCOSAL | Status: DC | PRN

## 2022-03-01 MED ORDER — DOCUSATE SODIUM 50 MG/5ML PO LIQD
100.0000 mg | Freq: Two times a day (BID) | ORAL | Status: DC
  Administered 2022-03-01 – 2022-03-02 (×2): 100 mg
  Filled 2022-03-01 (×2): qty 10

## 2022-03-01 MED ORDER — CHLORHEXIDINE GLUCONATE CLOTH 2 % EX PADS
6.0000 | MEDICATED_PAD | Freq: Every day | CUTANEOUS | Status: DC
  Administered 2022-03-01 – 2022-03-08 (×6): 6 via TOPICAL

## 2022-03-01 MED ORDER — LACTULOSE 10 GM/15ML PO SOLN: 20.0000 g | ORAL | Status: DC

## 2022-03-01 MED ORDER — PANTOPRAZOLE SODIUM 40 MG IV SOLR
40.0000 mg | INTRAVENOUS | Status: DC
  Administered 2022-03-01 – 2022-03-13 (×13): 40 mg via INTRAVENOUS
  Filled 2022-03-01 (×13): qty 10

## 2022-03-01 MED ORDER — KETAMINE HCL 50 MG/5ML IJ SOSY
75.0000 mg | PREFILLED_SYRINGE | INTRAMUSCULAR | Status: AC
  Administered 2022-03-01: 100 mg via INTRAVENOUS
  Filled 2022-03-01: qty 10

## 2022-03-01 MED ORDER — ROCURONIUM BROMIDE 10 MG/ML (PF) SYRINGE
PREFILLED_SYRINGE | INTRAVENOUS | Status: AC
  Administered 2022-03-01: 100 mg
  Filled 2022-03-01: qty 10

## 2022-03-01 MED ORDER — SODIUM CHLORIDE 0.9 % IV SOLN
2.0000 g | INTRAVENOUS | Status: DC
  Administered 2022-03-02 – 2022-03-03 (×2): 2 g via INTRAVENOUS
  Filled 2022-03-01 (×2): qty 20

## 2022-03-01 MED ORDER — FENTANYL 2500MCG IN NS 250ML (10MCG/ML) PREMIX INFUSION
50.0000 ug/h | INTRAVENOUS | Status: DC
  Administered 2022-03-01: 50 ug/h via INTRAVENOUS
  Administered 2022-03-02: 100 ug/h via INTRAVENOUS
  Administered 2022-03-03: 50 ug/h via INTRAVENOUS
  Filled 2022-03-01 (×2): qty 250

## 2022-03-01 MED ORDER — FENTANYL 2500MCG IN NS 250ML (10MCG/ML) PREMIX INFUSION
0.0000 ug/h | INTRAVENOUS | Status: DC
  Administered 2022-03-01: 50 ug/h via INTRAVENOUS
  Filled 2022-03-01: qty 250

## 2022-03-01 MED ORDER — PROPOFOL 1000 MG/100ML IV EMUL
5.0000 ug/kg/min | INTRAVENOUS | Status: DC
  Administered 2022-03-01: 5 ug/kg/min via INTRAVENOUS
  Administered 2022-03-01: 30 ug/kg/min via INTRAVENOUS
  Administered 2022-03-01: 40 ug/kg/min via INTRAVENOUS
  Administered 2022-03-02: 50 ug/kg/min via INTRAVENOUS
  Administered 2022-03-02: 60 ug/kg/min via INTRAVENOUS
  Administered 2022-03-02: 50 ug/kg/min via INTRAVENOUS
  Administered 2022-03-02: 25 ug/kg/min via INTRAVENOUS
  Administered 2022-03-02: 50 ug/kg/min via INTRAVENOUS
  Administered 2022-03-03: 60 ug/kg/min via INTRAVENOUS
  Administered 2022-03-03: 50 ug/kg/min via INTRAVENOUS
  Administered 2022-03-03 (×2): 40 ug/kg/min via INTRAVENOUS
  Administered 2022-03-03: 55 ug/kg/min via INTRAVENOUS
  Administered 2022-03-04: 30 ug/kg/min via INTRAVENOUS
  Administered 2022-03-04: 10 ug/kg/min via INTRAVENOUS
  Filled 2022-03-01 (×14): qty 100

## 2022-03-01 MED ORDER — SODIUM CHLORIDE 0.9 % IV SOLN
1.0000 g | Freq: Once | INTRAVENOUS | Status: AC
  Administered 2022-03-01: 1 g via INTRAVENOUS
  Filled 2022-03-01: qty 10

## 2022-03-01 MED ORDER — LORAZEPAM 2 MG/ML IJ SOLN: 1.0000 mg | Freq: Once | INTRAMUSCULAR | Status: DC

## 2022-03-01 MED ORDER — HEPARIN SODIUM (PORCINE) 5000 UNIT/ML IJ SOLN
5000.0000 [IU] | Freq: Three times a day (TID) | INTRAMUSCULAR | Status: DC
  Administered 2022-03-01 – 2022-03-15 (×42): 5000 [IU] via SUBCUTANEOUS
  Filled 2022-03-01 (×44): qty 1

## 2022-03-01 MED ORDER — ADULT MULTIVITAMIN W/MINERALS CH
1.0000 | ORAL_TABLET | Freq: Every day | ORAL | Status: DC
  Administered 2022-03-02: 1
  Filled 2022-03-01: qty 1

## 2022-03-01 MED ORDER — FENTANYL BOLUS VIA INFUSION
50.0000 ug | INTRAVENOUS | Status: AC
  Administered 2022-03-01: 50 ug via INTRAVENOUS
  Filled 2022-03-01: qty 50

## 2022-03-01 MED ORDER — ROCURONIUM BROMIDE 10 MG/ML (PF) SYRINGE: 100.0000 mg | PREFILLED_SYRINGE | INTRAVENOUS | Status: AC

## 2022-03-01 MED ORDER — ORAL CARE MOUTH RINSE
15.0000 mL | OROMUCOSAL | Status: DC
  Administered 2022-03-01 – 2022-03-07 (×67): 15 mL via OROMUCOSAL

## 2022-03-01 MED ORDER — POLYETHYLENE GLYCOL 3350 17 G PO PACK
17.0000 g | PACK | Freq: Every day | ORAL | Status: DC
  Administered 2022-03-01 – 2022-03-03 (×3): 17 g
  Filled 2022-03-01 (×3): qty 1

## 2022-03-01 MED ORDER — RIFAXIMIN 550 MG PO TABS
550.0000 mg | ORAL_TABLET | ORAL | Status: AC
  Administered 2022-03-01: 550 mg
  Filled 2022-03-01: qty 1

## 2022-03-01 MED ORDER — FENTANYL CITRATE PF 50 MCG/ML IJ SOSY: 50.0000 ug | PREFILLED_SYRINGE | Freq: Once | INTRAMUSCULAR | Status: AC

## 2022-03-01 MED ORDER — KETAMINE HCL 50 MG/5ML IJ SOSY
PREFILLED_SYRINGE | INTRAMUSCULAR | Status: AC
  Filled 2022-03-01: qty 5

## 2022-03-01 MED ORDER — NOREPINEPHRINE 4 MG/250ML-% IV SOLN
INTRAVENOUS | Status: AC
  Filled 2022-03-01: qty 250

## 2022-03-01 MED ORDER — LACTULOSE 10 GM/15ML PO SOLN: 10.0000 g | ORAL | Status: DC

## 2022-03-01 MED ORDER — ALBUMIN HUMAN 25 % IV SOLN
25.0000 g | Freq: Once | INTRAVENOUS | Status: AC
  Administered 2022-03-01: 25 g via INTRAVENOUS
  Filled 2022-03-01: qty 100

## 2022-03-01 MED ORDER — THIAMINE HCL 100 MG/ML IJ SOLN
100.0000 mg | Freq: Every day | INTRAMUSCULAR | Status: DC
  Administered 2022-03-02 – 2022-03-07 (×6): 100 mg via INTRAVENOUS
  Filled 2022-03-01 (×6): qty 2

## 2022-03-01 MED ORDER — ALBUMIN HUMAN 25 % IV SOLN
12.5000 g | Freq: Once | INTRAVENOUS | Status: AC
  Administered 2022-03-01: 12.5 g via INTRAVENOUS
  Filled 2022-03-01: qty 50

## 2022-03-01 MED ORDER — FENTANYL BOLUS VIA INFUSION: 50.0000 ug | INTRAVENOUS | Status: DC | PRN

## 2022-03-01 MED ORDER — FOLIC ACID 1 MG PO TABS
1.0000 mg | ORAL_TABLET | Freq: Every day | ORAL | Status: DC
  Administered 2022-03-02 – 2022-03-14 (×12): 1 mg
  Filled 2022-03-01 (×12): qty 1

## 2022-03-01 NOTE — ED Notes (Signed)
Mitts placed on patient for patient safety. Pt sedated and intubated, appears comfortable at this time.

## 2022-03-01 NOTE — Consult Note (Signed)
NAME:  Erica Banks, MRN:  [id number], DOB:  20-Jul-1980, LOS: 0 ADMISSION DATE:  03/01/2022, CONSULTATION DATE:  2/2 REFERRING MD:  [facility] -ED / Dr Sharlett Iles, CHIEF COMPLAINT:  ams   History of Present Illness:  8 yowf with Etoh Cirrhosis /ascites and h/o hep encephalopathy d/c from [facility] 1/29  to GI clinic 2/2 am  to schedule paracentesis but woke up confused > referred from GI to ER lethargic, risk of aspiration and need for rx with lactulose so electively intubated by EDP and request for consultation/ transfer to [facility] if bed available   Pertinent  Medical History  42 y.o. 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  Also with history of IDA following with hematology with prescription iron infusions, GERD, PE, DVT, neuropathy.   Significant Hospital Events: Including procedures, antibiotic start and stop dates in addition to other pertinent events   Oral ET  2/2 >>> UDS  2/2   pos benzo's and THC  Scheduled Meds:  ketamine HCl       lactulose  30 g Per Tube Q6H   LORazepam  1 mg Intravenous Once   Continuous Infusions:  norepinephrine     propofol (DIPRIVAN) infusion 5 mcg/kg/min (03/01/22 1332)   PRN Meds:.ketamine HCl, norepinephrine   Interim History / Subjective:  Sedated on vent/ hemodynamics ok s pressors   Objective   Blood pressure (!) 155/84, pulse (!) 101, temperature (!) 96.9 F (36.1 C), temperature source Axillary, resp. rate (!) 23, height 5\' 6"  (1.676 m), SpO2 100 %.    FiO2 (%):  [50 %] 50 % Set Rate:  [18 bmp] 18 bmp Vt Set:  [400 mL] 400 mL PEEP:  [5 cmH20] 5 cmH20 Plateau Pressure:  [16 cmH20] 16 cmH20   Intake/Output Summary (Last 24 hours) at 03/01/2022 1500 Last data filed at 03/01/2022 1455 Gross per 24 hour  Intake 200 ml  Output --  Net 200 ml   There were no vitals filed for this visit.  Examination: Tmax:  98.1 General appearance:     middle aged appearing wf sedated on vent     No jvd Oropharynx oral et/ og Neck supple Lungs distant bs bilaterally  s wheeze/ no air trapping on graphics/ not breathing above vent RRR no s3 or or sign murmur Abd tense asciters Extr warm without clubbing noted - trace edema both LEs Neuro  Sensorium sedated,  no apparent motor deficits       I personally reviewed images and agree with radiology impression as follows:  CXR:   portable p ET  2/2  The heart size and mediastinal contours are within normal limits. Both lungs are clear. Endotracheal tube is noted with distal tip 1 cm above the carina. The visualized skeletal structures are Unremarkable >>> ET moved back to 2 cm to 21 cm at lip p above cxr   Assessment & Plan:  1) vent dep acute hypoxemic respiratory failure secondary to severe hepatic encephalopathy and need protect airway, administer lactulose by FT >>> abg's pending >watch for risk of over ventilation due to liver dz on PRVC mode  2) Low grade SBP > was on cipro preop> rocephin/rifampic started 2/2   3) Severe etoh liver dz with  A) Varices B) Ascites with low grade SBP C) mild coagulopathy D) low albumin  E) Non AB Met acidosis from chronic hyperventilation     Labs   CBC: Recent  Labs  Lab 03/01/22 1050  WBC 13.6*  HGB 9.2*  HCT 28.4*  MCV 90.7  PLT 657    Basic Metabolic Panel: Recent Labs  Lab 03/01/22 1050  NA 130*  K 4.1  CL 102  CO2 14*  GLUCOSE 146*  BUN 23*  CREATININE 1.55*  CALCIUM 8.1*   GFR: Estimated Creatinine Clearance: 45.2 mL/min (A) (by C-G formula based on SCr of 1.55 mg/dL (H)). Recent Labs  Lab 03/01/22 1050  WBC 13.6*    Liver Function Tests: Recent Labs  Lab 03/01/22 1050  AST 115*  ALT 51*  ALKPHOS 131*  BILITOT 2.5*  PROT 5.8*  ALBUMIN 2.9*   No results for input(s): "LIPASE", "AMYLASE" in the last 168 hours. Recent Labs  Lab 03/01/22 1050  AMMONIA 289*    ABG    Component Value Date/Time    HCO3 17.9 (L) 03/01/2022 1226   ACIDBASEDEF 4.9 (H) 03/01/2022 1226   O2SAT 73 03/01/2022 1226     Coagulation Profile: Recent Labs  Lab 03/01/22 1050  INR 1.6*    Cardiac Enzymes: No results for input(s): "CKTOTAL", "CKMB", "CKMBINDEX", "TROPONINI" in the last 168 hours.  HbA1C: No results found for: "HGBA1C"  CBG: No results for input(s): "GLUCAP" in the last 168 hours.     Past Medical History:  She,  has a past medical history of Anemia, Cirrhosis ([place]), DVT (deep venous thrombosis) ([place]), Low iron, Neuropathy, Neuropathy, Panic attacks, and Pulmonary embolus ([place]) (2019).   Surgical History:   Past Surgical History:  Procedure Laterality Date   ABDOMINAL HYSTERECTOMY     BIOPSY N/A 06/24/2013   Procedure: BIOPSY TERMINAL ILEUM;  Surgeon: Daneil Dolin, MD;  Location: [facility];  Service: Endoscopy;  Laterality: N/A;   CHOLECYSTECTOMY     COLONOSCOPY N/A 06/24/2013   QIO:[id number] hemorrhoids/otherwise normal    COLONOSCOPY WITH PROPOFOL N/A 05/31/2021   Surgeon: Daneil Dolin, MD; ery large nonbleeding internal and external hemorrhoids, portal colopathy.  Recommended repeat colonoscopy in 10 years.   ESOPHAGOGASTRODUODENOSCOPY (EGD) WITH PROPOFOL N/A 03/29/2021   Surgeon: Eloise Harman, DO;  grade 1 esophageal varices, portal hypertensive gastropathy with friable tissue with spontaneous ooze, normal duodenum.   ESOPHAGOGASTRODUODENOSCOPY (EGD) WITH PROPOFOL N/A 05/31/2021   Surgeon: Daneil Dolin, MD;  grade 1-2 esophageal varices without bleeding stigmata, advanced appearing portal hypertensive gastropathy.   ESOPHAGOGASTRODUODENOSCOPY (EGD) WITH PROPOFOL N/A 12/22/2021   Procedure: ESOPHAGOGASTRODUODENOSCOPY (EGD) WITH PROPOFOL;  Surgeon: Harvel Quale, MD;  Location: [facility];  Service: Gastroenterology;  Laterality: N/A;   nasal bone surgery     NASAL SINUS SURGERY     SINUS IRRIGATION     TONSILLECTOMY     TUBAL LIGATION        Social History:   reports that she has been smoking cigarettes. She has a 6.00 pack-year smoking history. She has never used smokeless tobacco. She reports that she does not currently use alcohol after a past usage of about 4.0 - 5.0 standard drinks of alcohol per week. She reports current drug use. Drug: Marijuana.   Family History:  Her family history includes COPD in her mother; Cancer in an other family member; Cirrhosis in her father; Crohn's disease in her maternal grandmother; Diabetes in her mother and sister; Hypertension in her mother and sister. There is no history of Colon cancer or Autoimmune disease.   Allergies Allergies  Allergen Reactions   Demerol Anaphylaxis    Per patient cardiac arrest  Home Medications  Prior to Admission medications   Medication Sig Start Date End Date Taking? Authorizing Provider  CONSTULOSE 10 GM/15ML solution Take 30 mLs (20 g total) by mouth 3 (three) times daily. 12/22/21  Yes Kathie Dike, MD  furosemide (LASIX) 40 MG tablet TAKE 1 TABLET BY MOUTH DAILY 01/02/22  Yes Annitta Needs, NP  gabapentin (NEURONTIN) 300 MG capsule Take 300 mg by mouth 3 (three) times daily as needed (nerve pain). 02/25/21  Yes [provider]  hydrOXYzine (VISTARIL) 25 MG capsule Take 25 mg by mouth 3 (three) times daily as needed for anxiety. 11/13/21  Yes [provider]  meclizine (ANTIVERT) 12.5 MG tablet Take 12.5 mg by mouth 3 (three) times daily as needed for dizziness or nausea. 02/23/22 03/25/22 Yes [provider]  methocarbamol (ROBAXIN) 500 MG tablet Take 500 mg by mouth at bedtime as needed for muscle spasms. 11/29/21  Yes [provider]  metoCLOPramide (REGLAN) 5 MG tablet Take 5 mg by mouth 2 (two) times daily as needed for nausea or vomiting. 01/14/22  Yes [provider]  ondansetron (ZOFRAN-ODT) 4 MG disintegrating tablet Take 1 tablet (4 mg total) by mouth every 8 (eight) hours as needed for nausea or  vomiting. 12/03/21  Yes Godfrey Pick, MD  oxyCODONE (OXY IR/ROXICODONE) 5 MG immediate release tablet Take 5 mg by mouth every 4 (four) hours as needed for moderate pain. 02/23/22  Yes [provider]  pantoprazole (PROTONIX) 40 MG tablet Take 1 tablet (40 mg total) by mouth 2 (two) times daily before a meal. Patient taking differently: Take 40 mg by mouth daily. 03/30/21  Yes Kathie Dike, MD  promethazine (PHENERGAN) 12.5 MG tablet Take 12.5 mg by mouth 3 (three) times daily as needed for nausea or vomiting. 02/26/22  Yes [provider]  rifaximin (XIFAXAN) 550 MG TABS tablet Take 550 mg by mouth 2 (two) times daily. 02/23/22 05/24/22 Yes [provider]  spironolactone (ALDACTONE) 100 MG tablet TAKE 1 TABLET BY MOUTH DAILY 01/02/22  Yes Annitta Needs, NP      The patient is critically ill with multiple organ systems failure and requires high complexity decision making for assessment and support, frequent evaluation and titration of therapies, application of advanced monitoring technologies and extensive interpretation of multiple databases. Critical Care Time devoted to patient care services described in this note is 45 minutes.   Christinia Gully, MD Pulmonary and [place] [phone]   After 7:00 pm call Elink  [phone]

## 2022-03-01 NOTE — Procedures (Signed)
Paracentesis Procedure Note  Erica Banks  [id number]  February 04, 1980  Date:03/01/22  Time:11:44 PM   Provider Performing:Amely Voorheis D Rollene Rotunda    Procedure: Paracentesis with imaging guidance (09735)  Indication(s) Ascites  Consent Risks of the procedure as well as the alternatives and risks of each were explained to the patient and/or caregiver.  Consent for the procedure was obtained and is signed in the bedside chart  Anesthesia Topical only with 1% lidocaine    Time Out Verified patient identification, verified procedure, site/side was marked, verified correct patient position, special equipment/implants available, medications/allergies/relevant history reviewed, required imaging and test results available.   Sterile Technique Maximal sterile technique including full sterile barrier drape, hand hygiene, sterile gown, sterile gloves, mask, hair covering, sterile ultrasound probe cover (if used).   Procedure Description Ultrasound used to identify appropriate peritoneal anatomy for placement and overlying skin marked.  Area of drainage cleaned and draped in sterile fashion. Lidocaine was used to anesthetize the skin and subcutaneous tissue.  3000 cc's of clear/yellow appearing fluid was drained. Catheter then removed and bandaid applied to site.   Complications/Tolerance None; patient tolerated the procedure well.   EBL Minimal   Specimen(s) Peritoneal fluid  JD Rexene Agent [facility] 03/01/2022, 11:45 PM  Please see [email or web address] for pager details.  From 7A-7P if no response, please call [phone]. After hours, please call ELink [phone].

## 2022-03-01 NOTE — ED Triage Notes (Signed)
Pt had apt to have paracentesis today but woke confused. Seen GI today and sent here due to "ammonia level up" per friend with pt. Pt alert. Confused at times. Restless. Abd severly distended and firm. Just dc from [facility] Monday. Swelling in ble is not normal for her per friend.

## 2022-03-01 NOTE — ED Notes (Signed)
Per carelink, 20-25 minutes out

## 2022-03-01 NOTE — ED Notes (Signed)
Ankle bracelet and toe ring removed. Placed in specimen cup, labeled and placed in personal belonging bag.

## 2022-03-01 NOTE — Progress Notes (Signed)
eLink Physician-Brief Progress Note Patient Name: Erica Banks DOB: 1980/08/28 MRN: [id number]   Date of Service  03/01/2022  HPI/Events of Note  43 yr old female with decompensated alcoholic cirrhosis admitted with respiratory failure related to encephalopathy.  She also has massive ascites causing some degree of respiratory compromise.    eICU Interventions  Chart reviewed.     Intervention Category Evaluation Type: New Patient Evaluation  Mauri [place], P 03/01/2022, 10:24 PM

## 2022-03-01 NOTE — Consult Note (Signed)
Gastroenterology Consult   Referring Provider: No ref. provider found Primary Banks Physician:  Toma Deiters, MD Primary Gastroenterologist:  Dr. Jena Gauss  Patient ID: Erica Banks; [id number]; 30-Dec-1980   Admit date: 03/01/2022  LOS: 0 days   Date of Consultation: 03/01/2022  Reason for Consultation:  cirrhosis, hepatic encephalopathy  History of Present Illness   Erica Banks is a 42 y.o. year old female with history of alcoholic cirrhosis complicated by recurrent ascites, SBP on daily Cipro prophylaxis, hepatic cephalopathy, esophageal varices, IDA following with hematology, GERD, PE, DVT, neuropathy who presented to the ED at the recommendation of our office today for altered mental status/severe hepatic encephalopathy.  Patient has had multiple hospitalizations for decompensation.  In November 2023 she left AMA to [place] for her kids at home.  Last office visit 02/05/22 with Dr. Jena Gauss. Noted to have a history of rectal bleeding likely secondary to engorged hemorrhoids.  Has been following with hematology for IDA, requiring infusions.  Has history of varices without bleeding.  Patient declined nonselective beta-blocker due to labile blood pressures.  Presented to the clinic with abdominal distention, requesting repeat paracentesis.  Reportedly taking Lasix and spironolactone as directed.  On Cipro daily for SBP prophylaxis.  Reportedly has seen down Drisite, NP for [facility].  MELD not high enough to initiate transplant evaluation.  Patient educated on following recommendations of Banks providers.  Cirrhosis diet reinforced.  Advised to update labs including AFP and paracentesis with albumin ordered.  Patient advised to follow-up in 1 month.  Multiple hospital admissions to [facility] since follow up: 02/10/22 - 02/11/22; 02/13/22-02/16/22.  During her last admission to [facility] she underwent paracentesis on 02/14/2022 with 5 L of ascites removed with fluid analysis negative for  SBP.Marland Kitchen  Noted to have chronic transaminitis and hypoalbuminemia.  Hypokalemia had resolved.  Patient's baseline sodium 124-130.  Most recent admission at [facility] 02/20/22 - 02/23/22.  Presented with hepatic cephalopathy.  Was restarted on Xifaxan in addition to her lactulose.  Had good response during hospital stay.  Ammonia downtrending after treatment.  Initially required lactulose rectally and then transition to oral.  No paracentesis performed this admission.  Patient received 1 unit PRBC during admission for anemia.  Also had AKI with creatinine 3.16 on arrival.  Had improvement down to 1.31 prior to discharge.  Was advised to follow-up for paracentesis outpatient.   ED course: Patient intubated due to decline in GCS. Hemoglobin 9.2, platelets 186, creatinine 1.55, AST 115, ALT 51, alk phos 131, T. bili 2.5, sodium 130, INR 1.6, ethanol less than 10, ammonia 289 CT head without intracranial abnormality.   Patient presented to the clinic today in a wheelchair, very ill-appearing.  Vitals were stable however patient was rambling with random words and was unable to follow commands or answer questions.  Family stated she had woke up that way this morning.  Patient and family advised that patient needed to be transported to the ED and EMS offered however family requested to take patient by personal vehicle.   Unable to obtain any history from patient as she is intubated at time of my exam.  No family at bedside.  Per report from ED physician and ED nurse, family states she has not had any alcohol.  Past Medical History:  Diagnosis Date   Anemia    Cirrhosis (HCC)    DVT (deep venous thrombosis) (HCC)    Low iron    Neuropathy    Neuropathy  Panic attacks    Pulmonary embolus ([place]) 2019    Past Surgical History:  Procedure Laterality Date   ABDOMINAL HYSTERECTOMY     BIOPSY N/A 06/24/2013   Procedure: BIOPSY TERMINAL ILEUM;  Surgeon: Daneil Dolin, MD;  [facility];  Service:  Endoscopy;  Laterality: N/A;   CHOLECYSTECTOMY     COLONOSCOPY N/A 06/24/2013   [id number] hemorrhoids/otherwise normal    COLONOSCOPY WITH PROPOFOL N/A 05/31/2021   Surgeon: Daneil Dolin, MD; ery large nonbleeding internal and external hemorrhoids, portal colopathy.  Recommended repeat colonoscopy in 10 years.   ESOPHAGOGASTRODUODENOSCOPY (EGD) WITH PROPOFOL N/A 03/29/2021   Surgeon: Eloise Harman, DO;  grade 1 esophageal varices, portal hypertensive gastropathy with friable tissue with spontaneous ooze, normal duodenum.   ESOPHAGOGASTRODUODENOSCOPY (EGD) WITH PROPOFOL N/A 05/31/2021   Surgeon: Daneil Dolin, MD;  grade 1-2 esophageal varices without bleeding stigmata, advanced appearing portal hypertensive gastropathy.   ESOPHAGOGASTRODUODENOSCOPY (EGD) WITH PROPOFOL N/A 12/22/2021   Procedure: ESOPHAGOGASTRODUODENOSCOPY (EGD) WITH PROPOFOL;  Surgeon: Harvel Quale, MD;  Location: [facility];  Service: Gastroenterology;  Laterality: N/A;   nasal bone surgery     NASAL SINUS SURGERY     SINUS IRRIGATION     TONSILLECTOMY     TUBAL LIGATION      Prior to Admission medications   Medication Sig Start Date End Date Taking? Authorizing Provider  CONSTULOSE 10 GM/15ML solution Take 30 mLs (20 g total) by mouth 3 (three) times daily. 12/22/21  Yes Kathie Dike, MD  furosemide (LASIX) 40 MG tablet TAKE 1 TABLET BY MOUTH DAILY 01/02/22  Yes Annitta Needs, NP  gabapentin (NEURONTIN) 300 MG capsule Take 300 mg by mouth 3 (three) times daily as needed (nerve pain). 02/25/21  Yes [provider]  hydrOXYzine (VISTARIL) 25 MG capsule Take 25 mg by mouth 3 (three) times daily as needed for anxiety. 11/13/21  Yes [provider]  meclizine (ANTIVERT) 12.5 MG tablet Take 12.5 mg by mouth 3 (three) times daily as needed for dizziness or nausea. 02/23/22 03/25/22 Yes [provider]  methocarbamol (ROBAXIN) 500 MG tablet Take 500 mg by mouth at bedtime as  needed for muscle spasms. 11/29/21  Yes [provider]  metoCLOPramide (REGLAN) 5 MG tablet Take 5 mg by mouth 2 (two) times daily as needed for nausea or vomiting. 01/14/22  Yes [provider]  ondansetron (ZOFRAN-ODT) 4 MG disintegrating tablet Take 1 tablet (4 mg total) by mouth every 8 (eight) hours as needed for nausea or vomiting. 12/03/21  Yes Godfrey Pick, MD  oxyCODONE (OXY IR/ROXICODONE) 5 MG immediate release tablet Take 5 mg by mouth every 4 (four) hours as needed for moderate pain. 02/23/22  Yes [provider]  pantoprazole (PROTONIX) 40 MG tablet Take 1 tablet (40 mg total) by mouth 2 (two) times daily before a meal. Patient taking differently: Take 40 mg by mouth daily. 03/30/21  Yes Kathie Dike, MD  promethazine (PHENERGAN) 12.5 MG tablet Take 12.5 mg by mouth 3 (three) times daily as needed for nausea or vomiting. 02/26/22  Yes [provider]  rifaximin (XIFAXAN) 550 MG TABS tablet Take 550 mg by mouth 2 (two) times daily. 02/23/22 05/24/22 Yes [provider]  spironolactone (ALDACTONE) 100 MG tablet TAKE 1 TABLET BY MOUTH DAILY 01/02/22  Yes Annitta Needs, NP    Current Facility-Administered Medications  Medication Dose Route Frequency Provider Last Rate Last Admin   albumin human 25 % solution 25 g  25 g Intravenous Once Fransico Meadow, MD       cefTRIAXone (ROCEPHIN) 1 g in sodium chloride 0.9 % 100 mL IVPB  1 g Intravenous Once Fransico Meadow, MD 200 mL/hr at 03/01/22 1245 1 g at 03/01/22 1245   ketamine 50 mg in normal saline 5 mL (10 mg/mL) syringe  75 mg Intravenous STAT Fransico Meadow, MD       LORazepam (ATIVAN) injection 1 mg  1 mg Intravenous Once Fransico Meadow, MD       propofol (DIPRIVAN) 1000 MG/100ML infusion  5-80 mcg/kg/min Intravenous Continuous Fransico Meadow, MD       rocuronium [facility]) injection 100 mg  100 mg Intravenous STAT Fransico Meadow, MD       Current Outpatient Medications   Medication Sig Dispense Refill   CONSTULOSE 10 GM/15ML solution Take 30 mLs (20 g total) by mouth 3 (three) times daily. 236 mL 0   furosemide (LASIX) 40 MG tablet TAKE 1 TABLET BY MOUTH DAILY 30 tablet 3   gabapentin (NEURONTIN) 300 MG capsule Take 300 mg by mouth 3 (three) times daily as needed (nerve pain).     hydrOXYzine (VISTARIL) 25 MG capsule Take 25 mg by mouth 3 (three) times daily as needed for anxiety.     meclizine (ANTIVERT) 12.5 MG tablet Take 12.5 mg by mouth 3 (three) times daily as needed for dizziness or nausea.     methocarbamol (ROBAXIN) 500 MG tablet Take 500 mg by mouth at bedtime as needed for muscle spasms.     metoCLOPramide (REGLAN) 5 MG tablet Take 5 mg by mouth 2 (two) times daily as needed for nausea or vomiting.     ondansetron (ZOFRAN-ODT) 4 MG disintegrating tablet Take 1 tablet (4 mg total) by mouth every 8 (eight) hours as needed for nausea or vomiting. 12 tablet 0   oxyCODONE (OXY IR/ROXICODONE) 5 MG immediate release tablet Take 5 mg by mouth every 4 (four) hours as needed for moderate pain.     pantoprazole (PROTONIX) 40 MG tablet Take 1 tablet (40 mg total) by mouth 2 (two) times daily before a meal. (Patient taking differently: Take 40 mg by mouth daily.) 60 tablet 3   promethazine (PHENERGAN) 12.5 MG tablet Take 12.5 mg by mouth 3 (three) times daily as needed for nausea or vomiting.     rifaximin (XIFAXAN) 550 MG TABS tablet Take 550 mg by mouth 2 (two) times daily.     spironolactone (ALDACTONE) 100 MG tablet TAKE 1 TABLET BY MOUTH DAILY 30 tablet 3    Allergies as of 03/01/2022 - Review Complete 03/01/2022  Allergen Reaction Noted   Demerol Anaphylaxis 10/12/2010    Family History  Problem Relation Age of Onset   Diabetes Mother    Hypertension Mother    COPD Mother    Cirrhosis Father        ETOH   Diabetes Sister    Hypertension Sister    Crohn's disease Maternal Grandmother    Cancer Other    Colon cancer Neg Hx    Autoimmune disease  Neg Hx     Social History   Socioeconomic History   Marital status: Married    Spouse name: Not on file   Number of children: Not on file   Years of education: Not on file   Highest education level: Not on file  Occupational History   Occupation: Unemployed    Comment: wants to go to nursing school in fall  2015  Tobacco Use   Smoking status: Every Day    Packs/day: 0.50    Years: 12.00    Total pack years: 6.00    Types: Cigarettes   Smokeless tobacco: Never  Vaping Use   Vaping Use: Never used  Substance and Sexual Activity   Alcohol use: Not Currently    Alcohol/week: 4.0 - 5.0 standard drinks of alcohol    Types: 4 - 5 Cans of beer per week    Comment: None since February 2023.   Drug use: Yes    Types: Marijuana    Comment: occasional   Sexual activity: Yes    Birth control/protection: Surgical  Other Topics Concern   Not on file  Social History Narrative   Not on file   Social Determinants of Health   Financial Resource Strain: Not on file  Food Insecurity: No Food Insecurity (02/13/2022)   Hunger Vital Sign    Worried About Running Out of Food in the Last Year: Never true    Ran Out of Food in the Last Year: Never true  Transportation Needs: No Transportation Needs (02/13/2022)   PRAPARE - Hydrologist (Medical): No    Lack of Transportation (Non-Medical): No  Physical Activity: Not on file  Stress: Not on file  Social Connections: Not on file  Intimate Partner Violence: Not At Risk (02/13/2022)   Humiliation, Afraid, Rape, and Kick questionnaire    Fear of Current or Ex-Partner: No    Emotionally Abused: No    Physically Abused: No    Sexually Abused: No     Review of Systems   Patient intubated, unable to complete full review of systems with patient.  Physical Exam   Vital Signs in last 24 hours: Temp:  [97.9 F (36.6 C)-98.1 F (36.7 C)] 98.1 F (36.7 C) (02/02 1051) Pulse Rate:  [76-94] 76 (02/02 1200) Resp:   [16] 16 (02/02 1051) BP: (121-150)/(62-78) 121/78 (02/02 1200) SpO2:  [95 %-100 %] 100 % (02/02 1200)    General: Sedated, intubated, ill-appearing Head:  Normocephalic and atraumatic. Eyes:  Sclera clear, mild icterus.  conjunctiva pink. Heart:  Regular rate and rhythm; no murmurs, clicks, rubs,  or gallops. Abdomen:  Soft, distended.  No masses, hepatosplenomegaly or hernias noted. Normal bowel sounds. Rectal: deferred   Msk:  Symmetrical without gross deformities. Normal posture. Extremities: +3 pretibial edema laterally. Neurologic: Sedated, unable to follow commands Skin:  Intact without significant lesions or rashes. Psych: Sedated and intubated, unable to assess..  Intake/Output from previous day: No intake/output data recorded. Intake/Output this shift: No intake/output data recorded.  Wt Readings from Last 3 Encounters:  02/16/22 66.6 kg  02/11/22 67.6 kg  02/05/22 67.3 kg    Labs/Studies   Recent Labs Recent Labs    03/01/22 1050  WBC 13.6*  HGB 9.2*  HCT 28.4*  PLT 186   BMET Recent Labs    03/01/22 1050  NA 130*  K 4.1  CL 102  CO2 14*  GLUCOSE 146*  BUN 23*  CREATININE 1.55*  CALCIUM 8.1*   LFT Recent Labs    03/01/22 1050  PROT 5.8*  ALBUMIN 2.9*  AST 115*  ALT 51*  ALKPHOS 131*  BILITOT 2.5*   PT/INR Recent Labs    03/01/22 1050  LABPROT 18.8*  INR 1.6*   Hepatitis Panel No results for input(s): "HEPBSAG", "HCVAB", "HEPAIGM", "HEPBIGM" in the last 72 hours. C-Diff No results for input(s): "CDIFFTOX" in the last  72 hours.  Radiology/Studies CT Head Wo Contrast  Result Date: 03/01/2022 CLINICAL DATA:  Mental status change, unknown cause. History of liver disease. EXAM: CT HEAD WITHOUT CONTRAST TECHNIQUE: Contiguous axial images were obtained from the base of the skull through the vertex without intravenous contrast. RADIATION DOSE REDUCTION: This exam was performed according to the departmental dose-optimization program which  includes automated exposure control, adjustment of the mA and/or kV according to patient size and/or use of iterative reconstruction technique. COMPARISON:  Head CT 11/09/2008. FINDINGS: Moderate motion artifact despite repeat. Brain: No acute hemorrhage, mass effect or midline shift. Gray-white differentiation is preserved. No hydrocephalus. No extra-axial collection. Basilar cisterns are patent. Vascular: No hyperdense vessel or unexpected calcification. Skull: No calvarial fracture or suspicious bone lesion. Skull base is unremarkable. Sinuses/Orbits: Paranasal sinuses, mastoid air cells, and middle ear cavities are well aerated. Orbits are unremarkable. Other: None. IMPRESSION: Motion degraded study. No acute intracranial abnormality identified. Electronically Signed   By: Emmit Alexanders M.D.   On: 03/01/2022 12:52     Assessment   Erica Banks is a 42 y.o. year old female with history of alcoholic cirrhosis complicated by recurrent ascites, SBP on daily Cipro prophylaxis, hepatic cephalopathy, esophageal varices, IDA following with hematology, GERD, PE, DVT, neuropathy who presented to the ED at the recommendation of our office today for altered mental status/severe hepatic encephalopathy.  Cirrhosis, hepatic encephalopathy: Decompensated with ascites, SBP, hepatic encephalopathy, and esophageal varices without bleeding.  Has not been on nonselective beta-blocker due to hypotension.  Has had multiple hospitalizations over the last 3 months for hepatic encephalopathy.  Also recently has been requiring more frequent paracentesis.  Last discharged from outside hospital on 02/23/2022 and presented with worsening hepatic encephalopathy today.  Mental status declined further in the ED and required intubation for airway protection.  Ammonia 289.  MELD 3.0: 25.  Previously MELD not high enough for transplant evaluation as reported by [facility]ology.  She is distended on exam today therefore she is in need  of therapeutic and diagnostic paracentesis.  It appears she has been on lactulose 20 g 3 times daily, Lasix 40 mg daily, spironolactone 100 mg daily, and Xifaxan 550 mg twice daily at home since her most recent discharge.  She should continue on these medications as well as PPI twice daily.  Should remain on Cipro for SBP prophylaxis.  Chest x-ray unremarkable.  Will need to complete UA with urine culture given her current decompensation.  Of note patient is being transferred to [facility] given that she is intubated and needs higher level Banks which was initiated by ED physician.  Plan / Recommendations   Will need US paracentesis with albumin in the near future Follow up peritoneal cell count/cytology and culture.  CXR UA with culture Xifaxin 550 mg BID Increase Lactulose to 30g TID Continue lasix 40 mg daily and spironolactone 100 mg daily.  Continue PPI BID Needs follow up with portal hypertension clinic to discuss TIPS Continue Cipro for SBP prophylaxis (outpatient medication)     03/01/2022, 1:10 PM  Venetia Night, MSN, FNP-BC, AGACNP-BC [facility]

## 2022-03-01 NOTE — ED Notes (Signed)
Bair hugger placed at this time.  

## 2022-03-01 NOTE — ED Provider Notes (Signed)
[place] EMERGENCY DEPARTMENT AT [facility] Provider Note   CSN: [id number] Arrival date & time: 03/01/22  1037     History  Chief Complaint  Patient presents with   Hepatic Disease   Altered Mental Status    Erica Banks is a 42 y.o. female.  42 year old female with history of alcoholic cirrhosis complicated by ascites, SBP on daily ciprofloxacin, attic encephalopathy, and esophageal varices who presents emergency department with altered mental status.  History obtained per the patient's mother who states that last night she came to visit the patient from out of state and she was alert and conversant.  This morning was no longer as talkative and appeared very confused and drowsy.  Went to her GI doctor's this morning for therapeutic paracentesis but they referred her to the emergency department.  Her mother states that she has not had any recent illnesses including fever, nausea or vomiting, diarrhea.  Was not complaining of any abdominal pain prior to this morning.  Says that she was not able to get her to take her lactulose this morning.  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       Home Medications Prior to Admission medications   Medication Sig Start Date End Date Taking? Authorizing Provider  CONSTULOSE 10 GM/15ML solution Take 30 mLs (20 g total) by mouth 3 (three) times daily. 12/22/21  Yes Erick Blinks, MD  furosemide (LASIX) 40 MG tablet TAKE 1 TABLET BY MOUTH DAILY 01/02/22  Yes Gelene Mink, NP  gabapentin (NEURONTIN) 300 MG capsule Take 300 mg by mouth 3 (three) times daily as needed (nerve pain). 02/25/21  Yes [provider]  hydrOXYzine (VISTARIL) 25 MG capsule Take 25 mg by mouth 3 (three) times daily as needed for anxiety. 11/13/21  Yes [provider]  meclizine (ANTIVERT) 12.5 MG tablet Take 12.5 mg by mouth 3 (three) times daily as needed for dizziness or nausea. 02/23/22 03/25/22 Yes [provider]  methocarbamol (ROBAXIN) 500 MG tablet Take 500 mg by mouth at bedtime as needed for muscle spasms. 11/29/21  Yes [provider]  metoCLOPramide (REGLAN) 5 MG tablet Take 5 mg by mouth 2 (two) times daily as needed for nausea or vomiting. 01/14/22  Yes [provider]  ondansetron (ZOFRAN-ODT) 4 MG disintegrating tablet Take 1 tablet (4 mg total) by mouth every 8 (eight) hours as needed for nausea or vomiting. 12/03/21  Yes Gloris Manchester, MD  oxyCODONE (OXY IR/ROXICODONE) 5 MG immediate release tablet Take 5 mg by mouth every 4 (four) hours as needed for moderate pain. 02/23/22  Yes [provider]  pantoprazole (PROTONIX) 40 MG tablet Take 1 tablet (40 mg total) by mouth 2 (two) times daily before a meal. Patient taking differently: Take 40 mg by mouth daily. 03/30/21  Yes Erick Blinks, MD  promethazine (PHENERGAN) 12.5 MG tablet Take 12.5 mg by mouth 3 (three) times daily as needed for nausea or vomiting. 02/26/22  Yes [provider]  rifaximin (XIFAXAN) 550 MG TABS tablet Take 550 mg by mouth 2 (two) times daily. 02/23/22 05/24/22 Yes [provider]  spironolactone (ALDACTONE) 100 MG tablet TAKE 1 TABLET BY MOUTH DAILY 01/02/22  Yes Gelene Mink, NP      Allergies    Demerol    Review of Systems   Review of Systems  Physical Exam Updated Vital Signs BP 107/64   Pulse 92   Temp (!) 96.3 F (35.7 C)  Resp (!) 21   Ht 5\' 6"  (1.676 m)   LMP  (LMP Unknown)   SpO2 99%   BMI 23.70 kg/m  Physical Exam Vitals and nursing note reviewed.  Constitutional:      General: She is not in acute distress.    Appearance: She is well-developed. She is ill-appearing.     Comments: Drowsy.  Occasionally mumbling nonsensical words and sounds.  HENT:     Head: Normocephalic and atraumatic.  Eyes:     General: Scleral icterus present.     Conjunctiva/sclera: Conjunctivae normal.     Comments: Pupils 5 mm bilaterally sluggishly reactive to  light  Cardiovascular:     Rate and Rhythm: Normal rate and regular rhythm.     Heart sounds: No murmur heard. Pulmonary:     Effort: Pulmonary effort is normal. No respiratory distress.     Breath sounds: Normal breath sounds.  Abdominal:     General: There is distension.     Palpations: Abdomen is soft. There is no mass.     Tenderness: There is no abdominal tenderness. There is no guarding.  Musculoskeletal:        General: No swelling.     Cervical back: Neck supple.     Right lower leg: Edema present.     Left lower leg: Edema present.  Skin:    General: Skin is warm and dry.     Capillary Refill: Capillary refill takes less than 2 seconds.  Neurological:     Comments: Moves all 4 extremities.  See above for pupillary exam and verbal exam.  Psychiatric:        Mood and Affect: Mood normal.     ED Results / Procedures / Treatments   Labs (all labs ordered are listed, but only abnormal results are displayed) Labs Reviewed  CBC - Abnormal; Notable for the following components:      Result Value   WBC 13.6 (*)    RBC 3.13 (*)    Hemoglobin 9.2 (*)    HCT 28.4 (*)    RDW 16.8 (*)    All other components within normal limits  COMPREHENSIVE METABOLIC PANEL - Abnormal; Notable for the following components:   Sodium 130 (*)    CO2 14 (*)    Glucose, Bld 146 (*)    BUN 23 (*)    Creatinine, Ser 1.55 (*)    Calcium 8.1 (*)    Total Protein 5.8 (*)    Albumin 2.9 (*)    AST 115 (*)    ALT 51 (*)    Alkaline Phosphatase 131 (*)    Total Bilirubin 2.5 (*)    GFR, Estimated 43 (*)    All other components within normal limits  PROTIME-INR - Abnormal; Notable for the following components:   Prothrombin Time 18.8 (*)    INR 1.6 (*)    All other components within normal limits  AMMONIA - Abnormal; Notable for the following components:   Ammonia 289 (*)    All other components within normal limits  RAPID URINE DRUG SCREEN, HOSP PERFORMED - Abnormal; Notable for the  following components:   Benzodiazepines POSITIVE (*)    Tetrahydrocannabinol POSITIVE (*)    All other components within normal limits  BLOOD GAS, VENOUS - Abnormal; Notable for the following components:   pH, Ven 7.44 (*)    pCO2, Ven 27 (*)    Bicarbonate 17.9 (*)    Acid-base deficit 4.9 (*)    All  other components within normal limits  BODY FLUID CELL COUNT WITH DIFFERENTIAL - Abnormal; Notable for the following components:   Appearance, Fluid CLEAR (*)    Monocyte-Macrophage-Serous Fluid 13 (*)    All other components within normal limits  BLOOD GAS, ARTERIAL - Abnormal; Notable for the following components:   pH, Arterial 7.28 (*)    pO2, Arterial 126 (*)    Bicarbonate 19.3 (*)    Acid-base deficit 7.3 (*)    All other components within normal limits  BODY FLUID CULTURE W GRAM STAIN  CULTURE, BLOOD (ROUTINE X 2)  CULTURE, BLOOD (ROUTINE X 2)  ETHANOL  PATHOLOGIST SMEAR REVIEW  BLOOD GAS, ARTERIAL  URINALYSIS, W/ REFLEX TO CULTURE (INFECTION SUSPECTED)    EKG None  Radiology DG Abd 1 View  Result Date: 03/01/2022 CLINICAL DATA:  Evaluate OG tube placement EXAM: ABDOMEN - 1 VIEW COMPARISON:  02/13/2022 FINDINGS: Interval placement of enteric tube with tip in the expected location of the body of stomach. Side port is well below the GE junction. No dilated loops of small or large bowel. IMPRESSION: Tip of enteric tube is in the body of stomach. Electronically Signed   By: Kerby Moors M.D.   On: 03/01/2022 14:08   DG Chest Portable 1 View  Result Date: 03/01/2022 CLINICAL DATA:  Status post intubation. EXAM: PORTABLE CHEST 1 VIEW COMPARISON:  February 20, 2022. FINDINGS: The heart size and mediastinal contours are within normal limits. Both lungs are clear. Endotracheal tube is noted with distal tip 1 cm above the carina. The visualized skeletal structures are unremarkable. IMPRESSION: Endotracheal tube is noted with distal tip 1 cm above the carina. Withdrawal by 2-3 cm is  recommended. Electronically Signed   By: Marijo Conception M.D.   On: 03/01/2022 13:45   CT Head Wo Contrast  Result Date: 03/01/2022 CLINICAL DATA:  Mental status change, unknown cause. History of liver disease. EXAM: CT HEAD WITHOUT CONTRAST TECHNIQUE: Contiguous axial images were obtained from the base of the skull through the vertex without intravenous contrast. RADIATION DOSE REDUCTION: This exam was performed according to the departmental dose-optimization program which includes automated exposure control, adjustment of the mA and/or kV according to patient size and/or use of iterative reconstruction technique. COMPARISON:  Head CT 11/09/2008. FINDINGS: Moderate motion artifact despite repeat. Brain: No acute hemorrhage, mass effect or midline shift. Gray-white differentiation is preserved. No hydrocephalus. No extra-axial collection. Basilar cisterns are patent. Vascular: No hyperdense vessel or unexpected calcification. Skull: No calvarial fracture or suspicious bone lesion. Skull base is unremarkable. Sinuses/Orbits: Paranasal sinuses, mastoid air cells, and middle ear cavities are well aerated. Orbits are unremarkable. Other: None. IMPRESSION: Motion degraded study. No acute intracranial abnormality identified. Electronically Signed   By: Emmit Alexanders M.D.   On: 03/01/2022 12:52    Procedures Date/Time: 03/01/2022 5:14 PM  Performed by: Fransico Meadow, MDOxygen Delivery Method: Ambu bag Preoxygenation: Pre-oxygenation with 100% oxygen Induction Type: IV induction and Rapid sequence Laryngoscope Size: Glidescope and 4 Grade View: Grade I Tube size: 7.5 mm Number of attempts: 1 Airway Equipment and Method: Rigid stylet and Video-laryngoscopy Placement Confirmation: ETT inserted through vocal cords under direct vision Secured at: 21 cm Tube secured with: ETT holder Dental Injury: Teeth and Oropharynx as per pre-operative assessment     .Paracentesis  Date/Time: 03/01/2022 5:15  PM  Performed by: Fransico Meadow, MD Authorized by: Fransico Meadow, MD   Consent:    Consent obtained:  Emergent situation Pre-procedure details:  Procedure purpose:  Diagnostic   Preparation: Patient was prepped and draped in usual sterile fashion   Anesthesia:    Anesthesia method:  None Procedure details:    Needle gauge:  18   Puncture site:  R lower quadrant   Fluid removed amount:  15 ml   Fluid appearance:  Yellow and clear Post-procedure details:    Procedure completion:  Tolerated well, no immediate complications    Medications Ordered in ED Medications  LORazepam (ATIVAN) injection 1 mg (1 mg Intravenous Not Given 03/01/22 1313)  propofol (DIPRIVAN) 1000 MG/100ML infusion (40 mcg/kg/min  66.6 kg Intravenous Rate/Dose Change 03/01/22 1710)  norepinephrine (LEVOPHED) 4-5 MG/250ML-% infusion SOLN (  Not Given 03/01/22 1334)  ketamine HCl 50 MG/5ML SOSY (  Not Given 03/01/22 1334)  lactulose (CHRONULAC) 10 GM/15ML solution 30 g (30 g Per Tube Given 03/01/22 1434)  fentaNYL 2539mcg in NS 280mL (95mcg/ml) infusion-PREMIX (50 mcg/hr Intravenous New Bag/Given 03/01/22 1700)  albumin human 25 % solution 25 g (0 g Intravenous Stopped 03/01/22 1455)  cefTRIAXone (ROCEPHIN) 1 g in sodium chloride 0.9 % 100 mL IVPB (0 g Intravenous Stopped 03/01/22 1315)  ketamine 50 mg in normal saline 5 mL (10 mg/mL) syringe (100 mg Intravenous Given 03/01/22 1328)  rocuronium bromide 10 mg/mL (PF) syringe (100 mg Intravenous Given 03/01/22 1328)  rifaximin (XIFAXAN) tablet 550 mg (550 mg Per Tube Given 03/01/22 1434)  fentaNYL (SUBLIMAZE) bolus via infusion 50 mcg (50 mcg Intravenous Bolus from Bag 03/01/22 1700)    ED Course/ Medical Decision Making/ A&P Clinical Course as of 03/01/22 1716  Fri Mar 01, 2022  1307 Dr Melvyn Novas from critical care aware.  Feels that the patient should be transferred to [facility], ICU for further management.  Recommends touching base with GI regarding best way to administer  lactulose. [RP]  1338 Dr Nash Shearer from GI aware. States that NG tube for patient is acceptable for lactulose administration despite her varices.  [RP]  6283 NG tube withdrawn 2cm based on CXR.  [RP]  Agra NP from [place] is aware of the pt. They will put in orders shortly.  [RP]  1517 Discussed demerol allergy with pharmacy who states that fentanyl should be safe for the patient.  [RP]  6160 Signed out to Dr Rogene Houston awaiting transport.  [RP]  1706 Total Nucleated Cell Count, Fluid: 87 [RP]    Clinical Course User Index [RP] Fransico Meadow, MD                            Medical Decision Making Amount and/or Complexity of Data Reviewed Labs: ordered. Radiology: ordered.  Risk Prescription drug management. Decision regarding hospitalization.   Kobie AKYIA BORELLI is a 42 y.o. female with comorbidities that complicate the patient evaluation including alcoholic cirrhosis complicated by ascites, SBP on daily ciprofloxacin, attic encephalopathy, and esophageal varices who presents emergency department with altered mental status.   Initial Ddx:  Hepatic encephalopathy, ICH, hypoglycemia, SBP, GI bleed, substance use/alcohol  MDM:  Appears that patient is likely altered from hepatic encephalopathy given her history.  Suspect this is because she has not been taking her lactulose.  Could also represent ICH with her liver history so we will obtain CT of the head at this time.  Blood sugar appears to be WNL.  Will obtain drug screen and alcohol to evaluate for other causes of her altered mental status.  Feels SBP less likely but since  patient is unable to give history we will send off sample at this time.  No bleeding recently.  Feel the patient may require intubation if her mentation does not improve but appears that she is protecting her airway currently.  Plan:  Labs Ammonia INR Lactulose Diagnostic paracentesis Urine drug screen  ED Summary/Re-evaluation:  Patient was  monitored in the emergency department did not have improvement of her mentation.  CT head was degraded by motion but did not show any gross abnormalities.  Ammonia returned at 289 and feel that this is likely the cause of her symptoms.  Also found to have AKI which may be due to hepatorenal syndrome so patient was given albumin and was given ceftriaxone for prophylactic treatment of possible SBP while we were awaiting the results of her diagnostic paracentesis.  No significant anemia that would suggest GI bleed on her CBC.  She was ultimately intubated due to her persistent altered mental status and for airway protection given the fact that she will not likely improve quickly and will need to be transported.  OG tube was also placed and rifaximin and lactulose have been ordered for the patient.  Discussed with the ICU who felt that transfer to [facility] would be appropriate.  Did discuss with their team who has accepted the patient.  Signed out to the oncoming ED physician awaiting transport.  Paracentesis analysis does not reflect evidence of SBP so do not feel that continued antibiotics are warranted.  Family did make note that she is being evaluated for liver transplant.  This patient presents to the ED for concern of complaints listed in HPI, this involves an extensive number of treatment options, and is a complaint that carries with it a high risk of complications and morbidity. Disposition including potential need for admission considered.   Dispo: ICU  Additional history obtained from mother Records reviewed Outpatient Clinic Notes and DC Summary The following labs were independently interpreted: Chemistry and show chronic anemia I independently reviewed the following imaging with scope of interpretation limited to determining acute life threatening conditions related to emergency care: CT Head and agree with the radiologist interpretation with the following exceptions: None I personally reviewed and  interpreted cardiac monitoring: normal sinus rhythm  I personally reviewed and interpreted the pt's EKG: see above for interpretation  I have reviewed the patients home medications and made adjustments as needed Consults: Critical care and Gastroenterology  Final Clinical Impression(s) / ED Diagnoses Final diagnoses:  Hepatic encephalopathy ([place])  Glasgow coma scale total score 3-8, at arrival to emergency department [facility])  Decompensated hepatic cirrhosis ([place])    Rx / DC Orders ED Discharge Orders     None      CRITICAL CARE Performed by: Fransico Meadow   Total critical care time: 120 minutes  Critical care time was exclusive of separately billable procedures and treating other patients.  Critical care was necessary to treat or prevent imminent or life-threatening deterioration.  Critical care was time spent personally by me on the following activities: development of treatment plan with patient and/or surrogate as well as nursing, discussions with consultants, evaluation of patient's response to treatment, examination of patient, obtaining history from patient or surrogate, ordering and performing treatments and interventions, ordering and review of laboratory studies, ordering and review of radiographic studies, pulse oximetry and re-evaluation of patient's condition.    Fransico Meadow, MD 03/01/22 [phone]

## 2022-03-01 NOTE — ED Notes (Signed)
Spouse arrived at bedside.

## 2022-03-01 NOTE — ED Notes (Signed)
1328: RSI medications administered. 1330: Patient intubated at this time with 7.5 ETT, secured at 21/teeth. Positive color change, Positive breath sounds. Tolerated well.

## 2022-03-01 NOTE — ED Notes (Signed)
Pt transported to CT via stretcher.  

## 2022-03-01 NOTE — ED Notes (Signed)
Dr. Philip Aspen notified of temp 96.9 axillary. Awaiting new orders.

## 2022-03-02 ENCOUNTER — Inpatient Hospital Stay (HOSPITAL_COMMUNITY): Payer: Medicaid Other

## 2022-03-02 DIAGNOSIS — K7290 Hepatic failure, unspecified without coma: Secondary | ICD-10-CM | POA: Diagnosis not present

## 2022-03-02 DIAGNOSIS — K7460 Unspecified cirrhosis of liver: Secondary | ICD-10-CM | POA: Diagnosis not present

## 2022-03-02 DIAGNOSIS — N179 Acute kidney failure, unspecified: Secondary | ICD-10-CM | POA: Diagnosis not present

## 2022-03-02 DIAGNOSIS — K7682 Hepatic encephalopathy: Secondary | ICD-10-CM | POA: Diagnosis not present

## 2022-03-02 LAB — CBC
HCT: 24.5 % — ABNORMAL LOW (ref 36.0–46.0)
Hemoglobin: 8 g/dL — ABNORMAL LOW (ref 12.0–15.0)
MCH: 29.3 pg (ref 26.0–34.0)
MCHC: 32.7 g/dL (ref 30.0–36.0)
MCV: 89.7 fL (ref 80.0–100.0)
Platelets: 158 10*3/uL (ref 150–400)
RBC: 2.73 MIL/uL — ABNORMAL LOW (ref 3.87–5.11)
RDW: 17 % — ABNORMAL HIGH (ref 11.5–15.5)
WBC: 15.1 10*3/uL — ABNORMAL HIGH (ref 4.0–10.5)
nRBC: 0 % (ref 0.0–0.2)

## 2022-03-02 LAB — PROTIME-INR
INR: 1.8 — ABNORMAL HIGH (ref 0.8–1.2)
INR: 1.8 — ABNORMAL HIGH (ref 0.8–1.2)
Prothrombin Time: 20.5 seconds — ABNORMAL HIGH (ref 11.4–15.2)
Prothrombin Time: 21.1 seconds — ABNORMAL HIGH (ref 11.4–15.2)

## 2022-03-02 LAB — OSMOLALITY, URINE: Osmolality, Ur: 360 mOsm/kg (ref 300–900)

## 2022-03-02 LAB — HEPATITIS B CORE ANTIBODY, TOTAL: Hep B Core Total Ab: NONREACTIVE

## 2022-03-02 LAB — COMPREHENSIVE METABOLIC PANEL
ALT: 48 U/L — ABNORMAL HIGH (ref 0–44)
AST: 95 U/L — ABNORMAL HIGH (ref 15–41)
Albumin: 3.2 g/dL — ABNORMAL LOW (ref 3.5–5.0)
Alkaline Phosphatase: 103 U/L (ref 38–126)
Anion gap: 11 (ref 5–15)
BUN: 25 mg/dL — ABNORMAL HIGH (ref 6–20)
CO2: 17 mmol/L — ABNORMAL LOW (ref 22–32)
Calcium: 9.2 mg/dL (ref 8.9–10.3)
Chloride: 105 mmol/L (ref 98–111)
Creatinine, Ser: 2.1 mg/dL — ABNORMAL HIGH (ref 0.44–1.00)
GFR, Estimated: 30 mL/min — ABNORMAL LOW (ref >60)
Glucose, Bld: 94 mg/dL (ref 70–99)
Potassium: 4 mmol/L (ref 3.5–5.1)
Sodium: 133 mmol/L — ABNORMAL LOW (ref 135–145)
Total Bilirubin: 2.3 mg/dL — ABNORMAL HIGH (ref 0.3–1.2)
Total Protein: 5.7 g/dL — ABNORMAL LOW (ref 6.5–8.1)

## 2022-03-02 LAB — BODY FLUID CELL COUNT WITH DIFFERENTIAL
Lymphs, Fluid: 54 %
Monocyte-Macrophage-Serous Fluid: 17 % — ABNORMAL LOW (ref 50–90)
Neutrophil Count, Fluid: 29 % — ABNORMAL HIGH (ref 0–25)
Total Nucleated Cell Count, Fluid: 95 cu mm (ref 0–1000)

## 2022-03-02 LAB — PROTEIN, PLEURAL OR PERITONEAL FLUID: Total protein, fluid: <3 g/dL

## 2022-03-02 LAB — MAGNESIUM
Magnesium: 1.5 mg/dL — ABNORMAL LOW (ref 1.7–2.4)
Magnesium: 2.2 mg/dL (ref 1.7–2.4)

## 2022-03-02 LAB — HEPATITIS C ANTIBODY: HCV Ab: NONREACTIVE

## 2022-03-02 LAB — URINALYSIS, W/ REFLEX TO CULTURE (INFECTION SUSPECTED)
Bilirubin Urine: NEGATIVE
Glucose, UA: NEGATIVE mg/dL
Ketones, ur: NEGATIVE mg/dL
Nitrite: NEGATIVE
Protein, ur: 100 mg/dL — AB
RBC / HPF: >50 RBC/hpf (ref 0–5)
Specific Gravity, Urine: 1.019 (ref 1.005–1.030)
pH: 5 (ref 5.0–8.0)

## 2022-03-02 LAB — ALBUMIN, PLEURAL OR PERITONEAL FLUID: Albumin, Fluid: <1.5 g/dL

## 2022-03-02 LAB — GLUCOSE, CAPILLARY
Glucose-Capillary: 122 mg/dL — ABNORMAL HIGH (ref 70–99)
Glucose-Capillary: 109 mg/dL — ABNORMAL HIGH (ref 70–99)
Glucose-Capillary: 101 mg/dL — ABNORMAL HIGH (ref 70–99)
Glucose-Capillary: 105 mg/dL — ABNORMAL HIGH (ref 70–99)
Glucose-Capillary: 114 mg/dL — ABNORMAL HIGH (ref 70–99)
Glucose-Capillary: 154 mg/dL — ABNORMAL HIGH (ref 70–99)

## 2022-03-02 LAB — PHOSPHORUS
Phosphorus: 6.2 mg/dL — ABNORMAL HIGH (ref 2.5–4.6)
Phosphorus: 6.3 mg/dL — ABNORMAL HIGH (ref 2.5–4.6)

## 2022-03-02 LAB — SODIUM, URINE, RANDOM: Sodium, Ur: 11 mmol/L

## 2022-03-02 LAB — HEPATITIS B SURFACE ANTIGEN: Hepatitis B Surface Ag: NONREACTIVE

## 2022-03-02 LAB — TRIGLYCERIDES: Triglycerides: 105 mg/dL (ref <150)

## 2022-03-02 LAB — AMMONIA: Ammonia: 133 umol/L — ABNORMAL HIGH (ref 9–35)

## 2022-03-02 MED ORDER — ALBUMIN HUMAN 25 % IV SOLN
100.0000 g | Freq: Once | INTRAVENOUS | Status: AC
  Administered 2022-03-02: 100 g via INTRAVENOUS
  Filled 2022-03-02: qty 400

## 2022-03-02 MED ORDER — ALBUMIN HUMAN 25 % IV SOLN
25.0000 g | Freq: Four times a day (QID) | INTRAVENOUS | Status: DC
  Administered 2022-03-02 – 2022-03-04 (×7): 25 g via INTRAVENOUS
  Filled 2022-03-02 (×8): qty 100

## 2022-03-02 MED ORDER — SODIUM CHLORIDE 0.9 % IV SOLN
50.0000 ug/h | INTRAVENOUS | Status: DC
  Administered 2022-03-02 – 2022-03-06 (×9): 50 ug/h via INTRAVENOUS
  Filled 2022-03-02 (×11): qty 1

## 2022-03-02 MED ORDER — VITAL 1.5 CAL PO LIQD
1000.0000 mL | ORAL | Status: DC
  Administered 2022-03-02 – 2022-03-04 (×3): 1000 mL

## 2022-03-02 MED ORDER — PROSOURCE TF20 ENFIT COMPATIBL EN LIQD
60.0000 mL | Freq: Every day | ENTERAL | Status: DC
  Administered 2022-03-02 – 2022-03-07 (×6): 60 mL
  Filled 2022-03-02 (×6): qty 60

## 2022-03-02 MED ORDER — ZINC SULFATE 220 (50 ZN) MG PO CAPS
220.0000 mg | ORAL_CAPSULE | Freq: Every day | ORAL | Status: DC
  Administered 2022-03-02 – 2022-03-04 (×3): 220 mg via ORAL
  Filled 2022-03-02 (×3): qty 1

## 2022-03-02 MED ORDER — LACTULOSE 10 GM/15ML PO SOLN
30.0000 g | ORAL | Status: DC
  Administered 2022-03-02 – 2022-03-04 (×12): 30 g
  Filled 2022-03-02 (×12): qty 45

## 2022-03-02 MED ORDER — MAGNESIUM SULFATE 2 GM/50ML IV SOLN
2.0000 g | Freq: Once | INTRAVENOUS | Status: AC
  Administered 2022-03-02: 2 g via INTRAVENOUS
  Filled 2022-03-02: qty 50

## 2022-03-02 MED ORDER — MIDODRINE HCL 5 MG PO TABS
5.0000 mg | ORAL_TABLET | Freq: Three times a day (TID) | ORAL | Status: DC
  Administered 2022-03-02 – 2022-03-04 (×6): 5 mg via ORAL
  Filled 2022-03-02 (×6): qty 1

## 2022-03-02 MED ORDER — ADULT MULTIVITAMIN W/MINERALS CH
1.0000 | ORAL_TABLET | Freq: Every day | ORAL | Status: DC
  Administered 2022-03-02 – 2022-03-07 (×6): 1
  Filled 2022-03-02 (×6): qty 1

## 2022-03-02 MED ORDER — ADULT MULTIVITAMIN W/MINERALS CH: 1.0000 | ORAL_TABLET | ORAL | Status: DC

## 2022-03-02 NOTE — Consult Note (Addendum)
Reason for Consult: Renal failure Referring Physician:  Dr. 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  DDx includes HRS vs ATN vs prerenal azotemia vs MPGN. - Will send for urine studies, check C3C4, hepatitis panel, UPC and serologic workup. Difficult to make the diagnosis of HRS in the setting of hematuria and leukocyturia but his is likely secondary to trauma as the prior U/A on 02/13/22 was negative for blood or LE. - Agree with octreotide and Midodrine; no absolute indication for dialysis. And if this is HRS T1 even with dialysis mortality is very high. Certainly may be possible to require dialysis during this hospitalization.  - Will check renal ultrasound to r/o obstruction which is lower on the differential.  --Maintain MAP>65 for optimal renal perfusion.  - Avoid nephrotoxic medications including NSAIDs and iodinated intravenous contrast exposure unless the latter is absolutely indicated.   - Preferred narcotic agents for pain control are hydromorphone, fentanyl, and methadone. Morphine should not be used.  - Avoid Baclofen and avoid oral sodium phosphate and magnesium citrate based laxatives / bowel preps.  - Continue strict Input and Output monitoring. Will monitor the patient closely with you and intervene or adjust therapy as indicated by changes in clinical status/labs  ETOH liver cirrhosis with hepatic encephalopathy on rifaximin R/o SBP + LVP 3L of clear/yellow fluid removed on 2/2 on Abx therapy as well. H/o PE   HPI: Erica Banks is an 42 y.o. female history of PE, DVT, neuropathy, alcoholic cirrhosis with recurrent ascites, esophageal varices, hepatic encephalopathy [facility] with GI follow clinic where noted patient was confused. Patient transferred to [facility] as she looked very ill, was encephalopathic, not following  commands or answering questions.  intubated for airway protection. She is on daily ciprofloxacin for SBP prophylaxis started also on rocephin and rifaximin.  She is on Lasix 40mg  daiy at home.  Cr was 0.86 on 1/20. Patient now noted to have a BUN/Cr of 25/2.1 with a Na of 133. NH3 289 up from 27 on 1/19.   ROS Pertinent items are noted in HPI.  Chemistry and CBC: Creat  Date/Time Value Ref Range Status  02/05/2022 11:50 AM 0.78 0.50 - 0.99 mg/dL Final  01/07/2022 10:59 AM 0.76 0.50 - 0.99 mg/dL Final  12/31/2021 01:38 PM 0.95 0.50 - 0.99 mg/dL Final  10/09/2021 02:27 PM 0.86 0.50 - 0.99 mg/dL Final  09/04/2021 10:34 AM 0.73 0.50 - 0.97 mg/dL Final  08/23/2021 09:36 AM 0.69 0.50 - 0.97 mg/dL Final  04/17/2021 10:50 AM 0.57 0.50 - 0.97 mg/dL Final   Creatinine, Ser  Date/Time Value Ref Range Status  03/02/2022 06:00 AM 2.10 (H) 0.44 - 1.00 mg/dL Final  03/01/2022 10:52 PM 1.88 (H) 0.44 - 1.00 mg/dL Final  03/01/2022 10:50 AM 1.55 (H) 0.44 - 1.00 mg/dL Final  02/16/2022 05:29 AM 0.86 0.44 - 1.00 mg/dL Final  02/15/2022 03:42 AM 0.92 0.44 - 1.00 mg/dL Final  02/14/2022 04:15 AM 0.97 0.44 - 1.00 mg/dL Final  02/13/2022 04:36 PM 0.97 0.44 - 1.00 mg/dL Final  02/11/2022 01:35 PM 0.83 0.44 - 1.00 mg/dL Final  02/11/2022 05:58 AM 0.78 0.44 - 1.00 mg/dL Final  02/11/2022 02:12 AM 0.84 0.44 - 1.00 mg/dL Final  02/10/2022 05:23 PM 0.86 0.44 - 1.00 mg/dL Final  12/22/2021 03:58 AM 0.73 0.44 - 1.00 mg/dL Final  12/21/2021 02:57  AM 0.78 0.44 - 1.00 mg/dL Final  12/20/2021 03:16 AM 1.00 0.44 - 1.00 mg/dL Final  12/19/2021 02:54 AM 1.33 (H) 0.44 - 1.00 mg/dL Final  12/18/2021 06:30 PM 1.53 (H) 0.44 - 1.00 mg/dL Final  12/03/2021 08:38 AM 0.82 0.44 - 1.00 mg/dL Final  11/23/2021 04:21 AM 0.82 0.44 - 1.00 mg/dL Final  11/22/2021 08:43 PM 0.94 0.44 - 1.00 mg/dL Final  05/29/2021 08:43 AM 0.56 0.44 - 1.00 mg/dL Final  04/09/2021 09:13 AM 0.59 0.44 - 1.00 mg/dL Final  04/05/2021 10:04 AM 0.51 0.44 -  1.00 mg/dL Final  03/30/2021 05:19 AM 0.61 0.44 - 1.00 mg/dL Final  03/29/2021 04:35 AM 0.57 0.44 - 1.00 mg/dL Final  03/28/2021 10:10 AM 0.60 0.44 - 1.00 mg/dL Final  03/27/2021 05:16 AM 0.68 0.44 - 1.00 mg/dL Final  08/24/2020 02:24 PM 0.43 (L) 0.44 - 1.00 mg/dL Final  08/18/2020 09:28 AM 0.57 0.44 - 1.00 mg/dL Final  07/19/2019 10:09 PM 0.62 0.44 - 1.00 mg/dL Final  12/30/2016 07:49 PM 0.60 0.44 - 1.00 mg/dL Final  01/02/2015 08:47 AM 0.72 0.44 - 1.00 mg/dL Final  01/01/2015 05:10 PM 0.86 0.44 - 1.00 mg/dL Final  04/10/2013 01:50 PM 0.85 0.50 - 1.10 mg/dL Final  03/05/2012 01:15 PM 0.74 0.50 - 1.10 mg/dL Final  11/09/2008 09:42 PM 0.67 0.4 - 1.2 mg/dL Final   Recent Labs  Lab 03/01/22 1050 03/01/22 2252 03/02/22 0600  NA 130*  --  133*  K 4.1  --  4.0  CL 102  --  105  CO2 14*  --  17*  GLUCOSE 146*  --  94  BUN 23*  --  25*  CREATININE 1.55* 1.88* 2.10*  CALCIUM 8.1*  --  9.2  PHOS  --   --  6.2*   Recent Labs  Lab 03/01/22 1050 03/01/22 2252 03/02/22 0600  WBC 13.6* 19.7* 15.1*  HGB 9.2* 8.8* 8.0*  HCT 28.4* 27.6* 24.5*  MCV 90.7 91.7 89.7  PLT 186 192 158   Liver Function Tests: Recent Labs  Lab 03/01/22 1050 03/02/22 0600  AST 115* 95*  ALT 51* 48*  ALKPHOS 131* 103  BILITOT 2.5* 2.3*  PROT 5.8* 5.7*  ALBUMIN 2.9* 3.2*   No results for input(s): "LIPASE", "AMYLASE" in the last 168 hours. Recent Labs  Lab 03/01/22 1050  AMMONIA 289*   Cardiac Enzymes: No results for input(s): "CKTOTAL", "CKMB", "CKMBINDEX", "TROPONINI" in the last 168 hours. Iron Studies: No results for input(s): "IRON", "TIBC", "TRANSFERRIN", "FERRITIN" in the last 72 hours. PT/INR: @LABRCNTIP (inr:5)  Xrays/Other Studies: ) Results for orders placed or performed during the hospital encounter of 03/01/22 (from the past 48 hour(s))  CBC     Status: Abnormal   Collection Time: 03/01/22 10:50 AM  Result Value Ref Range   WBC 13.6 (H) 4.0 - 10.5 K/uL   RBC 3.13 (L) 3.87 - 5.11  MIL/uL   Hemoglobin 9.2 (L) 12.0 - 15.0 g/dL   HCT 28.4 (L) 36.0 - 46.0 %   MCV 90.7 80.0 - 100.0 fL   MCH 29.4 26.0 - 34.0 pg   MCHC 32.4 30.0 - 36.0 g/dL   RDW 16.8 (H) 11.5 - 15.5 %   Platelets 186 150 - 400 K/uL   nRBC 0.0 0.0 - 0.2 %    Comment: Performed at [facility], [address] [place], [place] [address]  Comprehensive metabolic panel     Status: Abnormal   Collection Time: 03/01/22 10:50 AM  Result Value Ref Range  Sodium 130 (L) 135 - 145 mmol/L   Potassium 4.1 3.5 - 5.1 mmol/L   Chloride 102 98 - 111 mmol/L   CO2 14 (L) 22 - 32 mmol/L   Glucose, Bld 146 (H) 70 - 99 mg/dL    Comment: Glucose reference range applies only to samples taken after fasting for at least 8 hours.   BUN 23 (H) 6 - 20 mg/dL   Creatinine, Ser 1.55 (H) 0.44 - 1.00 mg/dL   Calcium 8.1 (L) 8.9 - 10.3 mg/dL   Total Protein 5.8 (L) 6.5 - 8.1 g/dL   Albumin 2.9 (L) 3.5 - 5.0 g/dL   AST 115 (H) 15 - 41 U/L   ALT 51 (H) 0 - 44 U/L   Alkaline Phosphatase 131 (H) 38 - 126 U/L   Total Bilirubin 2.5 (H) 0.3 - 1.2 mg/dL   GFR, Estimated 43 (L) >60 mL/min    Comment: (NOTE) Calculated using the CKD-EPI Creatinine Equation (2021)    Anion gap 14 5 - 15    Comment: Performed at [facility], [address] [place], [place] [address]  Protime-INR     Status: Abnormal   Collection Time: 03/01/22 10:50 AM  Result Value Ref Range   Prothrombin Time 18.8 (H) 11.4 - 15.2 seconds   INR 1.6 (H) 0.8 - 1.2    Comment: (NOTE) INR goal varies based on device and disease states. Performed at [facility], [address], [place], [place] [address]   Ammonia     Status: Abnormal   Collection Time: 03/01/22 10:50 AM  Result Value Ref Range   Ammonia 289 (H) 9 - 35 umol/L    Comment: Performed at [facility], [address] [place], [place] [address]  Ethanol     Status: None   Collection Time: 03/01/22 12:26 PM  Result Value Ref Range   Alcohol, Ethyl (B) <10 <10 mg/dL    Comment: (NOTE) Lowest detectable  limit for serum alcohol is 10 mg/dL.  For medical purposes only. Performed at [facility], [address], [place], [place] [address]   Blood gas, venous     Status: Abnormal   Collection Time: 03/01/22 12:26 PM  Result Value Ref Range   pH, Ven 7.44 (H) 7.25 - 7.43   pCO2, Ven 27 (L) 44 - 60 mmHg   pO2, Ven 41 32 - 45 mmHg   Bicarbonate 17.9 (L) 20.0 - 28.0 mmol/L   Acid-base deficit 4.9 (H) 0.0 - 2.0 mmol/L   O2 Saturation 73 %   Patient temperature 36.6    Collection site LEFT ANTECUBITAL    Drawn by [id number]     Comment: Performed at [facility], [address], [place], [place] [address]  Rapid urine drug screen (hospital performed)     Status: Abnormal   Collection Time: 03/01/22  1:06 PM  Result Value Ref Range   Opiates NONE DETECTED NONE DETECTED   Cocaine NONE DETECTED NONE DETECTED   Benzodiazepines POSITIVE (A) NONE DETECTED   Amphetamines NONE DETECTED NONE DETECTED   Tetrahydrocannabinol POSITIVE (A) NONE DETECTED   Barbiturates NONE DETECTED NONE DETECTED    Comment: (NOTE) DRUG SCREEN FOR MEDICAL PURPOSES ONLY.  IF CONFIRMATION IS NEEDED FOR ANY PURPOSE, NOTIFY LAB WITHIN 5 DAYS.  LOWEST DETECTABLE LIMITS FOR URINE DRUG SCREEN Drug Class                     Cutoff (ng/mL) Amphetamine and metabolites    1000 Barbiturate and  metabolites    200 Benzodiazepine                 200 Opiates and metabolites        300 Cocaine and metabolites        300 THC                            50 Performed at [facility], [address], [place], [place] [address]   Body fluid cell count with differential     Status: Abnormal   Collection Time: 03/01/22  1:45 PM  Result Value Ref Range   Fluid Type-FCT ASCITIC     Comment: CORRECTED ON 02/02 AT 1418: PREVIOUSLY REPORTED AS Peritoneal   Color, Fluid YELLOW YELLOW   Appearance, Fluid CLEAR (A) CLEAR   Total Nucleated Cell Count, Fluid 87 0 - 1,000 cu mm   Neutrophil Count, Fluid 24 0 - 25 %   Lymphs, Fluid 63 %    Monocyte-Macrophage-Serous Fluid 13 (L) 50 - 90 %   Eos, Fluid 0 %   Other Cells, Fluid OTHER CELLS IDENTIFIED AS MESOTHELIAL CELLS %    Comment: Performed at [facility], [address], [place], [place] [address]  Body fluid culture w Gram Stain     Status: None (Preliminary result)   Collection Time: 03/01/22  1:45 PM   Specimen: Ascitic; Peritoneal Fluid  Result Value Ref Range   Specimen Description      ASCITIC Performed at [facility], [address] [place], [place] [address]    Special Requests      NONE Performed at [facility], [address] [place], [place] [address]    Gram Stain      CYTOSPIN SMEAR WBC PRESENT,BOTH PMN AND MONONUCLEAR NO ORGANISMS SEEN Performed at [facility], [place] [address], [place], [place] [address]    Culture PENDING    Report Status PENDING   Blood gas, arterial (at [facility])     Status: Abnormal   Collection Time: 03/01/22  2:48 PM  Result Value Ref Range   pH, Arterial 7.28 (L) 7.35 - 7.45   pCO2 arterial 40 32 - 48 mmHg   pO2, Arterial 126 (H) 83 - 108 mmHg   Bicarbonate 19.3 (L) 20.0 - 28.0 mmol/L   Acid-base deficit 7.3 (H) 0.0 - 2.0 mmol/L   O2 Saturation 99.9 %   Patient temperature 35.3    Collection site LEFT RADIAL    Drawn by [id number]    Allens test (pass/fail) PASS PASS    Comment: Performed at [facility], [address], [place], [place] [address]  Culture, blood (Routine X 2) w Reflex to ID Panel     Status: None (Preliminary result)   Collection Time: 03/01/22  4:48 PM   Specimen: BLOOD  Result Value Ref Range   Specimen Description BLOOD BLOOD RIGHT ARM    Special Requests NONE    Culture      NO GROWTH < 24 HOURS Performed at [facility], [address], [place], [place] [address]    Report Status PENDING   Culture, blood (Routine X 2) w Reflex to ID Panel     Status: None (Preliminary result)   Collection Time: 03/01/22  4:48 PM   Specimen: BLOOD  Result Value Ref Range   Specimen Description BLOOD BLOOD  LEFT HAND    Special Requests NONE    Culture  NO GROWTH < 24 HOURS Performed at [facility], [address], [place], [place] [address]    Report Status PENDING   Blood gas, arterial (at [facility])     Status: Abnormal   Collection Time: 03/01/22  4:56 PM  Result Value Ref Range   pH, Arterial 7.38 7.35 - 7.45   pCO2 arterial 30 (L) 32 - 48 mmHg   pO2, Arterial 160 (H) 83 - 108 mmHg   Bicarbonate 18.1 (L) 20.0 - 28.0 mmol/L   Acid-base deficit 6.6 (H) 0.0 - 2.0 mmol/L   O2 Saturation 100 %   Patient temperature 35.5    Collection site LEFT RADIAL    Drawn by [id number]    Allens test (pass/fail) PASS PASS    Comment: Performed at [facility], [address], [place], [place] [address]  MRSA Next Gen by PCR, Nasal     Status: None   Collection Time: 03/01/22  8:38 PM   Specimen: Nasal Mucosa; Nasal Swab  Result Value Ref Range   MRSA by PCR Next Gen NOT DETECTED NOT DETECTED    Comment: (NOTE) The GeneXpert MRSA Assay (FDA approved for NASAL specimens only), is one component of a comprehensive MRSA colonization surveillance program. It is not intended to diagnose MRSA infection nor to guide or monitor treatment for MRSA infections. Test performance is not FDA approved in patients less than 20 years old. Performed at [facility], [place] [address], [place], [place] [address]   Glucose, capillary     Status: None   Collection Time: 03/01/22  8:40 PM  Result Value Ref Range   Glucose-Capillary 95 70 - 99 mg/dL    Comment: Glucose reference range applies only to samples taken after fasting for at least 8 hours.  CBC     Status: Abnormal   Collection Time: 03/01/22 10:52 PM  Result Value Ref Range   WBC 19.7 (H) 4.0 - 10.5 K/uL   RBC 3.01 (L) 3.87 - 5.11 MIL/uL   Hemoglobin 8.8 (L) 12.0 - 15.0 g/dL   HCT 27.6 (L) 36.0 - 46.0 %   MCV 91.7 80.0 - 100.0 fL   MCH 29.2 26.0 - 34.0 pg   MCHC 31.9 30.0 - 36.0 g/dL   RDW 16.9 (H) 11.5 - 15.5 %   Platelets 192 150 - 400 K/uL    nRBC 0.0 0.0 - 0.2 %    Comment: Performed at [facility] [address] [place], [place] [address]  Creatinine, serum     Status: Abnormal   Collection Time: 03/01/22 10:52 PM  Result Value Ref Range   Creatinine, Ser 1.88 (H) 0.44 - 1.00 mg/dL   GFR, Estimated 34 (L) >60 mL/min    Comment: (NOTE) Calculated using the CKD-EPI Creatinine Equation (2021) Performed at [facility] [address] [place], [place] [address]   Glucose, capillary     Status: Abnormal   Collection Time: 03/01/22 11:09 PM  Result Value Ref Range   Glucose-Capillary 125 (H) 70 - 99 mg/dL    Comment: Glucose reference range applies only to samples taken after fasting for at least 8 hours.  Albumin, pleural or peritoneal fluid      Status: None   Collection Time: 03/02/22 12:00 AM  Result Value Ref Range   Albumin, Fluid <1.5 g/dL    Comment: RESULTS VERIFIED BY REPEAT TESTING (NOTE) No normal range established for this test Results should be evaluated in conjunction with serum values  Fluid Type-FALB PERITONEAL     Comment: Performed at [facility], [place] [address], [place], [place] [address]  Protein, pleural or peritoneal fluid     Status: None   Collection Time: 03/02/22 12:00 AM  Result Value Ref Range   Total protein, fluid <3.0 g/dL    Comment: RESULTS VERIFIED BY REPEAT TESTING (NOTE) No normal range established for this test Results should be evaluated in conjunction with serum values    Fluid Type-FTP PERITONEAL     Comment: Performed at [facility] [address], [place], [place] [address]  Body fluid cell count with differential     Status: Abnormal   Collection Time: 03/02/22 12:00 AM  Result Value Ref Range   Fluid Type-FCT Peritoneal    Color, Fluid STRAW (A) YELLOW   Appearance, Fluid CLEAR (A) CLEAR   Total Nucleated Cell Count, Fluid 95 0 - 1,000 cu mm   Neutrophil Count, Fluid 29 (H) 0 - 25 %   Lymphs, Fluid 54 %   Monocyte-Macrophage-Serous  Fluid 17 (L) 50 - 90 %   Other Cells, Fluid FEW MESOTHELIAL CELLS %    Comment: Performed at [facility] [address], [place], [place] [address]  Body fluid culture w Gram Stain     Status: None (Preliminary result)   Collection Time: 03/02/22 12:00 AM   Specimen: Peritoneal Washings; Body Fluid  Result Value Ref Range   Specimen Description PERITONEAL FLUID    Special Requests NONE    Gram Stain      CYTOSPIN SMEAR WBC PRESENT,BOTH PMN AND MONONUCLEAR NO ORGANISMS SEEN Performed at [facility], [address], [place], [place] [address]    Culture PENDING    Report Status PENDING   Urinalysis, w/ Reflex to Culture (Infection Suspected) -     Status: Abnormal   Collection Time: 03/02/22  1:50 AM  Result Value Ref Range   Specimen Source URINE, RANDOM    Color, Urine AMBER (A) YELLOW    Comment: BIOCHEMICALS MAY BE AFFECTED BY COLOR   APPearance CLOUDY (A) CLEAR   Specific Gravity, Urine 1.019 1.005 - 1.030   pH 5.0 5.0 - 8.0   Glucose, UA NEGATIVE NEGATIVE mg/dL   Hgb urine dipstick MODERATE (A) NEGATIVE   Bilirubin Urine NEGATIVE NEGATIVE   Ketones, ur NEGATIVE NEGATIVE mg/dL   Protein, ur 100 (A) NEGATIVE mg/dL   Nitrite NEGATIVE NEGATIVE   Leukocytes,Ua SMALL (A) NEGATIVE   RBC / HPF >50 0 - 5 RBC/hpf   WBC, UA 21-50 0 - 5 WBC/hpf    Comment:        Reflex urine culture not performed if WBC <=10, OR if Squamous epithelial cells >5. If Squamous epithelial cells >5 suggest recollection.    Bacteria, UA FEW (A) NONE SEEN   Squamous Epithelial / HPF 0-5 0 - 5 /HPF   Mucus PRESENT    Hyaline Casts, UA PRESENT     Comment: Performed at [facility], [place] [address], [place], [place] [address]  Glucose, capillary     Status: Abnormal   Collection Time: 03/02/22  3:48 AM  Result Value Ref Range   Glucose-Capillary 122 (H) 70 - 99 mg/dL    Comment: Glucose reference range applies only to samples taken after fasting for at least 8 hours.   Triglycerides     Status: None   Collection Time: 03/02/22  6:00 AM  Result Value Ref Range  Triglycerides 105 <150 mg/dL    Comment: Performed at [facility], [place] [address], [place], [place] [address]  Comprehensive metabolic panel     Status: Abnormal   Collection Time: 03/02/22  6:00 AM  Result Value Ref Range   Sodium 133 (L) 135 - 145 mmol/L   Potassium 4.0 3.5 - 5.1 mmol/L   Chloride 105 98 - 111 mmol/L   CO2 17 (L) 22 - 32 mmol/L   Glucose, Bld 94 70 - 99 mg/dL    Comment: Glucose reference range applies only to samples taken after fasting for at least 8 hours.   BUN 25 (H) 6 - 20 mg/dL   Creatinine, Ser 2.10 (H) 0.44 - 1.00 mg/dL   Calcium 9.2 8.9 - 10.3 mg/dL   Total Protein 5.7 (L) 6.5 - 8.1 g/dL   Albumin 3.2 (L) 3.5 - 5.0 g/dL   AST 95 (H) 15 - 41 U/L   ALT 48 (H) 0 - 44 U/L   Alkaline Phosphatase 103 38 - 126 U/L   Total Bilirubin 2.3 (H) 0.3 - 1.2 mg/dL   GFR, Estimated 30 (L) >60 mL/min    Comment: (NOTE) Calculated using the CKD-EPI Creatinine Equation (2021)    Anion gap 11 5 - 15    Comment: Performed at [facility], [place] [address], [place], [place] [address]  Magnesium     Status: Abnormal   Collection Time: 03/02/22  6:00 AM  Result Value Ref Range   Magnesium 1.5 (L) 1.7 - 2.4 mg/dL    Comment: Performed at [facility] [address] [place], [place] [address]  CBC     Status: Abnormal   Collection Time: 03/02/22  6:00 AM  Result Value Ref Range   WBC 15.1 (H) 4.0 - 10.5 K/uL   RBC 2.73 (L) 3.87 - 5.11 MIL/uL   Hemoglobin 8.0 (L) 12.0 - 15.0 g/dL   HCT 24.5 (L) 36.0 - 46.0 %   MCV 89.7 80.0 - 100.0 fL   MCH 29.3 26.0 - 34.0 pg   MCHC 32.7 30.0 - 36.0 g/dL   RDW 17.0 (H) 11.5 - 15.5 %   Platelets 158 150 - 400 K/uL   nRBC 0.0 0.0 - 0.2 %    Comment: Performed at [facility], [place] [address] [place],  [address]  Phosphorus     Status: Abnormal   Collection Time: 03/02/22  6:00 AM  Result Value Ref Range    Phosphorus 6.2 (H) 2.5 - 4.6 mg/dL    Comment: Performed at [facility] [address] [place], [place] [address]  Glucose, capillary     Status: Abnormal   Collection Time: 03/02/22  7:47 AM  Result Value Ref Range   Glucose-Capillary 109 (H) 70 - 99 mg/dL    Comment: Glucose reference range applies only to samples taken after fasting for at least 8 hours.   Portable Chest x-ray  Result Date: 03/01/2022 CLINICAL DATA:  Intubated EXAM: PORTABLE CHEST 1 VIEW COMPARISON:  03/01/2022 FINDINGS: Single frontal view of the chest demonstrates interval retraction of the endotracheal tube, which overlies tracheal air column 3 cm above carina. Enteric catheter passes below diaphragm, tip projecting over the gastric body. Cardiac silhouette is unremarkable. Lung volumes are diminished, without airspace disease, effusion, or pneumothorax. No acute bony abnormality. IMPRESSION: 1. Interval retraction of the endotracheal tube, tip now 3 cm above carina. 2. Low lung volumes. Electronically Signed   By: Legrand Como  Owens Shark M.D.   On: 03/01/2022 21:58   DG Abd 1 View  Result Date: 03/01/2022 CLINICAL DATA:  Evaluate OG tube placement EXAM: ABDOMEN - 1 VIEW COMPARISON:  02/13/2022 FINDINGS: Interval placement of enteric tube with tip in the expected location of the body of stomach. Side port is well below the GE junction. No dilated loops of small or large bowel. IMPRESSION: Tip of enteric tube is in the body of stomach. Electronically Signed   By: Kerby Moors M.D.   On: 03/01/2022 14:08   DG Chest Portable 1 View  Result Date: 03/01/2022 CLINICAL DATA:  Status post intubation. EXAM: PORTABLE CHEST 1 VIEW COMPARISON:  February 20, 2022. FINDINGS: The heart size and mediastinal contours are within normal limits. Both lungs are clear. Endotracheal tube is noted with distal tip 1 cm above the carina. The visualized skeletal structures are unremarkable. IMPRESSION: Endotracheal tube is noted with distal tip 1 cm  above the carina. Withdrawal by 2-3 cm is recommended. Electronically Signed   By: Marijo Conception M.D.   On: 03/01/2022 13:45   CT Head Wo Contrast  Result Date: 03/01/2022 CLINICAL DATA:  Mental status change, unknown cause. History of liver disease. EXAM: CT HEAD WITHOUT CONTRAST TECHNIQUE: Contiguous axial images were obtained from the base of the skull through the vertex without intravenous contrast. RADIATION DOSE REDUCTION: This exam was performed according to the departmental dose-optimization program which includes automated exposure control, adjustment of the mA and/or kV according to patient size and/or use of iterative reconstruction technique. COMPARISON:  Head CT 11/09/2008. FINDINGS: Moderate motion artifact despite repeat. Brain: No acute hemorrhage, mass effect or midline shift. Gray-white differentiation is preserved. No hydrocephalus. No extra-axial collection. Basilar cisterns are patent. Vascular: No hyperdense vessel or unexpected calcification. Skull: No calvarial fracture or suspicious bone lesion. Skull base is unremarkable. Sinuses/Orbits: Paranasal sinuses, mastoid air cells, and middle ear cavities are well aerated. Orbits are unremarkable. Other: None. IMPRESSION: Motion degraded study. No acute intracranial abnormality identified. Electronically Signed   By: Emmit Alexanders M.D.   On: 03/01/2022 12:52    PMH:   Past Medical History:  Diagnosis Date   Anemia    Cirrhosis ([place])    DVT (deep venous thrombosis) ([facility])    Low iron    Neuropathy    Neuropathy    Panic attacks    Pulmonary embolus ([place]) 2019    PSH:   Past Surgical History:  Procedure Laterality Date   ABDOMINAL HYSTERECTOMY     BIOPSY N/A 06/24/2013   Procedure: BIOPSY TERMINAL ILEUM;  Surgeon: Daneil Dolin, MD;  Location: [facility];  Service: Endoscopy;  Laterality: N/A;   CHOLECYSTECTOMY     COLONOSCOPY N/A 06/24/2013   [id number] hemorrhoids/otherwise normal    COLONOSCOPY WITH PROPOFOL  N/A 05/31/2021   Surgeon: Daneil Dolin, MD; ery large nonbleeding internal and external hemorrhoids, portal colopathy.  Recommended repeat colonoscopy in 10 years.   ESOPHAGOGASTRODUODENOSCOPY (EGD) WITH PROPOFOL N/A 03/29/2021   Surgeon: Eloise Harman, DO;  grade 1 esophageal varices, portal hypertensive gastropathy with friable tissue with spontaneous ooze, normal duodenum.   ESOPHAGOGASTRODUODENOSCOPY (EGD) WITH PROPOFOL N/A 05/31/2021   Surgeon: Daneil Dolin, MD;  grade 1-2 esophageal varices without bleeding stigmata, advanced appearing portal hypertensive gastropathy.   ESOPHAGOGASTRODUODENOSCOPY (EGD) WITH PROPOFOL N/A 12/22/2021   Procedure: ESOPHAGOGASTRODUODENOSCOPY (EGD) WITH PROPOFOL;  Surgeon: Harvel Quale, MD;  Location: [facility];  Service: Gastroenterology;  Laterality: N/A;   nasal bone surgery  NASAL SINUS SURGERY     SINUS IRRIGATION     TONSILLECTOMY     TUBAL LIGATION      Allergies:  Allergies  Allergen Reactions   Demerol Anaphylaxis    Per patient cardiac arrest    Medications:   Prior to Admission medications   Medication Sig Start Date End Date Taking? Authorizing Provider  CONSTULOSE 10 GM/15ML solution Take 30 mLs (20 g total) by mouth 3 (three) times daily. 12/22/21  Yes Erick Blinks, MD  furosemide (LASIX) 40 MG tablet TAKE 1 TABLET BY MOUTH DAILY 01/02/22  Yes Gelene Mink, NP  gabapentin (NEURONTIN) 300 MG capsule Take 300 mg by mouth 3 (three) times daily as needed (nerve pain). 02/25/21  Yes [provider]  hydrOXYzine (VISTARIL) 25 MG capsule Take 25 mg by mouth 3 (three) times daily as needed for anxiety. 11/13/21  Yes [provider]  meclizine (ANTIVERT) 12.5 MG tablet Take 12.5 mg by mouth 3 (three) times daily as needed for dizziness or nausea. 02/23/22 03/25/22 Yes [provider]  methocarbamol (ROBAXIN) 500 MG tablet Take 500 mg by mouth at bedtime as needed for muscle spasms. 11/29/21   Yes [provider]  metoCLOPramide (REGLAN) 5 MG tablet Take 5 mg by mouth 2 (two) times daily as needed for nausea or vomiting. 01/14/22  Yes [provider]  ondansetron (ZOFRAN-ODT) 4 MG disintegrating tablet Take 1 tablet (4 mg total) by mouth every 8 (eight) hours as needed for nausea or vomiting. 12/03/21  Yes Gloris Manchester, MD  oxyCODONE (OXY IR/ROXICODONE) 5 MG immediate release tablet Take 5 mg by mouth every 4 (four) hours as needed for moderate pain. 02/23/22  Yes [provider]  pantoprazole (PROTONIX) 40 MG tablet Take 1 tablet (40 mg total) by mouth 2 (two) times daily before a meal. Patient taking differently: Take 40 mg by mouth daily. 03/30/21  Yes Erick Blinks, MD  promethazine (PHENERGAN) 12.5 MG tablet Take 12.5 mg by mouth 3 (three) times daily as needed for nausea or vomiting. 02/26/22  Yes [provider]  rifaximin (XIFAXAN) 550 MG TABS tablet Take 550 mg by mouth 2 (two) times daily. 02/23/22 05/24/22 Yes [provider]  spironolactone (ALDACTONE) 100 MG tablet TAKE 1 TABLET BY MOUTH DAILY 01/02/22  Yes Gelene Mink, NP    Discontinued Meds:   Medications Discontinued During This Encounter  Medication Reason   lactulose (CHRONULAC) 10 GM/15ML solution 20 g    lactulose (CHRONULAC) 10 GM/15ML solution 10 g    fentaNYL in NS (62mcg/ml) infusion-PREMIX    famotidine (PEPCID) tablet 20 mg Duplicate   LORazepam (ATIVAN) injection 1 mg    famotidine (PEPCID) tablet 20 mg    docusate (COLACE) 50 MG/5ML liquid 100 mg    multivitamin with minerals tablet 1 tablet    lactulose (CHRONULAC) 10 GM/15ML solution 30 g    multivitamin with minerals tablet 1 tablet     Social History:  reports that she has been smoking cigarettes. She has a 6.00 pack-year smoking history. She has never used smokeless tobacco. She reports that she does not currently use alcohol after a past usage of about 4.0 - 5.0 standard drinks of alcohol per  week. She reports current drug use. Drug: Marijuana.  Family History:   Family History  Problem Relation Age of Onset   Diabetes Mother    Hypertension Mother    COPD Mother    Cirrhosis Father  ETOH   Diabetes Sister    Hypertension Sister    Crohn's disease Maternal Grandmother    Cancer Other    Colon cancer Neg Hx    Autoimmune disease Neg Hx     Blood pressure 121/74, pulse 97, temperature 98.4 F (36.9 C), temperature source Bladder, resp. rate 15, height 5\' 6"  (1.676 m), weight 69.1 kg, SpO2 100 %. General appearance: intubated Head: NCAT Eyes: no scleral icterus noted Neck: no adenopathy, no carotid bruit, supple, symmetrical, trachea midline, and thyroid not enlarged, symmetric, no tenderness/mass/nodules Back: negative Resp: mechanical BS Cardio: regular rate and rhythm GI: distended Extremities: edema 1-2+ Pulses: 2+ and symmetric       Bobbi Kozakiewicz, Hunt Oris, MD 03/02/2022, 10:37 AM

## 2022-03-02 NOTE — Progress Notes (Signed)
Initial Nutrition Assessment  DOCUMENTATION CODES:   Not applicable, suspect malnutrition  INTERVENTION:   Initiate tube feeds via OG tube: - Start Vital 1.5 @ 20 ml/hr and advance rate by 10 ml q 8 hours to goal rate of 55 ml/hr (1320 ml/day) - PROSource TF20 60 ml daily  Tube feeding regimen at goal rate provides 2060 kcal, 109 grams of protein, and 1008 ml of H2O.   Monitor magnesium, potassium, and phosphorus q 12 hours for at least 6 occurrences, MD to replete as needed, as pt is at risk for refeeding syndrome given ETOH abuse, suspected malnutrition.  - Continue MVI, thiamine, folic acid  NUTRITION DIAGNOSIS:   Inadequate oral intake related to inability to eat as evidenced by NPO status.  GOAL:   Patient will meet greater than or equal to 90% of their needs  MONITOR:   Vent status, Labs, Weight trends, TF tolerance, I & O's  REASON FOR ASSESSMENT:   Ventilator, Consult Enteral/tube feeding initiation and management  ASSESSMENT:   42 year old female who presented to the ED on 2/02 with AMS. 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 Pt required intubation. Pt admitted with severe hepatic encephalopathy, acute renal failure.  02/02 - s/p paracentesis with 3000 ml of fluid removed  RD working remotely.  Consult received for enteral nutrition initiation and management. Pt with OG tube in body of the stomach with side port below GE junction per abdominal x-ray yesterday.  Unable to obtain diet and weight history at this time. Reviewed weight history in chart. Weight has fluctuated between 63-74 kg over the last year. Suspect weight fluctuations are related to volume status given recurrent ascites. Suspect dry weight is closer to 65-67 kg.  Pt at refeeding risk. Discussed low magnesium lab from this AM with Pharmacy who will order replacement. Will start tube feeds at  trickle rate and slowly advance to goal. Suspect pt with malnutrition but unable to confirm without NFPE.  Admit weight: 72.9 kg Current weight: 69.1 kg  Patient is currently intubated on ventilator support MV: 6.7 L/min Temp (24hrs), Avg:96.9 F (36.1 C), Min:95.2 F (35.1 C), Max:98.8 F (37.1 C)  Drips: Propofol: 50 mcg/kg/min (provides 527 kcal daily from lipid) Fentanyl Octreotide  Medications reviewed and include: folic acid, lactulose 30 grams q 4 hours, MVI with minerals, IV protonix, miralax, IV thiamine 100 mg daily, zinc sulfate 220 mg daily, IV albumin 25 grams q 6 hours, IV abx  Labs reviewed: sodium 133, BUN 25, creatinine 2.10, phosphorus 6.2, magnesium 1.5, elevated LFTs, ammonia 289, WBC 15.1, hemoglobin 8.0 CBG's: 95-125 x 24 hours  UOP: 430 ml x 24 hours I/O's: -2.6 L since admit  NUTRITION - FOCUSED PHYSICAL EXAM:  Unable to complete at this time. RD working remotely.  Diet Order:   Diet Order             Diet NPO time specified Except for: Other (See Comments)  Diet effective now                   EDUCATION NEEDS:   Not appropriate for education at this time  Skin:  Skin Assessment: Reviewed RN Assessment  Last BM:  no documented BM  Height:   Ht Readings from Last 1 Encounters:  03/01/22 5\' 6"  (1.676 m)    Weight:   Wt Readings from Last 1 Encounters:  03/02/22 69.1 kg    Ideal Body Weight:  59.1 kg  BMI:  Body mass index is 24.59 kg/m.  Estimated Nutritional Needs:   Kcal:  2000-2200  Protein:  100-115 grams  Fluid:  >1.8 L    Gustavus Bryant, MS, RD, LDN Inpatient Clinical Dietitian Please see [facility] for contact information.

## 2022-03-02 NOTE — Consult Note (Addendum)
NAME:  Erica Banks, MRN:  [id number], DOB:  20-Nov-1980, LOS: 1 ADMISSION DATE:  03/01/2022, CONSULTATION DATE:  2/2 REFERRING MD:  [facility] -ED / Dr Sharlett Iles, CHIEF COMPLAINT:  ams   History of Present Illness:  79 yowf with Etoh Cirrhosis /ascites and h/o hep encephalopathy d/c from [facility] 1/29  to GI clinic 2/2 am  to schedule paracentesis but woke up confused > referred from GI to ER lethargic, risk of aspiration and need for rx with lactulose so electively intubated by EDP and request for consultation/ transfer to [facility] if bed available   Pertinent  Medical History  42 y.o. 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  Also with history of IDA following with hematology with prescription iron infusions, GERD, PE, DVT, neuropathy.   Significant Hospital Events: Including procedures, antibiotic start and stop dates in addition to other pertinent events   Oral ET  2/2 >>> UDS  2/2   pos benzo's and THC  Scheduled Meds:  Chlorhexidine Gluconate Cloth  6 each Topical Q0600   docusate  100 mg Per Tube BID   folic acid  1 mg Per Tube Daily   heparin  5,000 Units Subcutaneous Q8H   lactulose  30 g Per Tube Q6H   multivitamin with minerals  1 tablet Per Tube Daily   mouth rinse  15 mL Mouth Rinse Q2H   pantoprazole (PROTONIX) IV  40 mg Intravenous Q24H   polyethylene glycol  17 g Per Tube Daily   rifaximin  550 mg Per Tube BID   thiamine (VITAMIN B1) injection  100 mg Intravenous Daily   Continuous Infusions:  cefTRIAXone (ROCEPHIN)  IV     fentaNYL infusion INTRAVENOUS 50 mcg/hr (03/02/22 0600)   propofol (DIPRIVAN) infusion 25 mcg/kg/min (03/02/22 0600)   PRN Meds:.fentaNYL, mouth rinse   Interim History / Subjective:   Critically ill sedated on mechanical life support.  Large-volume paracentesis last night.  Blood pressure stable.  Repeat serum creatinine  climbing  Objective   Blood pressure 128/69, pulse 96, temperature 98.4 F (36.9 C), resp. rate 12, height 5\' 6"  (1.676 m), weight 69.1 kg, SpO2 100 %.    Vent Mode: PRVC FiO2 (%):  [50 %] 50 % Set Rate:  [18 bmp-22 bmp] 20 bmp Vt Set:  [400 mL] 400 mL PEEP:  [5 cmH20] 5 cmH20 Plateau Pressure:  [4 cmH20-19 cmH20] 19 cmH20   Intake/Output Summary (Last 24 hours) at 03/02/2022 0755 Last data filed at 03/02/2022 0600 Gross per 24 hour  Intake 665.46 ml  Output 3430 ml  Net -2764.54 ml   Filed Weights   03/01/22 2046 03/02/22 0424  Weight: 72.9 kg 69.1 kg    Examination: General: Middle-aged female critically ill chronically ill-appearing intubated on mechanical life support HEENT: Endotracheal tube in place, muscle wasting visible Extremities: Thin muscle wasting no significant edema Abdomen: Severely distended consistent with ascites Heart: Regular rhythm S1-S2 Lungs: Bilateral mechanically ventilated breath sounds    Assessment & Plan:   Acute hypoxemic respiratory failure requiring intubation mechanical ventilation Acute metabolic toxic encephalopathy secondary to hepatic encephalopathy and hyperammonemia Acute on chronic decompensated liver cirrhosis secondary to alcohol use Restrictive respiratory physiology secondary to severe abdominal distention and alcoholic ascites status post large-volume paracentesis Acute renal failure, at risk for hepatorenal syndrome Hyponatremia Hypoalbuminemia  Plan: Remains intubated on mechanical life support Adult mechanical vent protocol, wean PEEP and FiO2 to maintain sats above  90% Tolerating SAT SBT however mental status precludes liberation from ventilator at this time. Continue Rocephin plus rifaximin Dose of albumin following large-volume paracentesis plus every 6 hour dosing Start octreotide infusion and midodrine in the setting of worsening renal function with severe liver disease for concern of possible HRS Continue lactulose  and rifaximin for hyperammonemia + zinc supplement, Thiamine and MVI Consult placed to gastroenterology and nephrology.    Labs   CBC: Recent Labs  Lab 03/01/22 1050 03/01/22 2252 03/02/22 0600  WBC 13.6* 19.7* 15.1*  HGB 9.2* 8.8* 8.0*  HCT 28.4* 27.6* 24.5*  MCV 90.7 91.7 89.7  PLT 186 192 782    Basic Metabolic Panel: Recent Labs  Lab 03/01/22 1050 03/01/22 2252 03/02/22 0600  NA 130*  --  133*  K 4.1  --  4.0  CL 102  --  105  CO2 14*  --  17*  GLUCOSE 146*  --  94  BUN 23*  --  25*  CREATININE 1.55* 1.88* 2.10*  CALCIUM 8.1*  --  9.2  MG  --   --  1.5*  PHOS  --   --  6.2*   GFR: Estimated Creatinine Clearance: 33.3 mL/min (A) (by C-G formula based on SCr of 2.1 mg/dL (H)). Recent Labs  Lab 03/01/22 1050 03/01/22 2252 03/02/22 0600  WBC 13.6* 19.7* 15.1*    Liver Function Tests: Recent Labs  Lab 03/01/22 1050 03/02/22 0600  AST 115* 95*  ALT 51* 48*  ALKPHOS 131* 103  BILITOT 2.5* 2.3*  PROT 5.8* 5.7*  ALBUMIN 2.9* 3.2*   No results for input(s): "LIPASE", "AMYLASE" in the last 168 hours. Recent Labs  Lab 03/01/22 1050  AMMONIA 289*    ABG    Component Value Date/Time   PHART 7.38 03/01/2022 1656   PCO2ART 30 (L) 03/01/2022 1656   PO2ART 160 (H) 03/01/2022 1656   HCO3 18.1 (L) 03/01/2022 1656   ACIDBASEDEF 6.6 (H) 03/01/2022 1656   O2SAT 100 03/01/2022 1656     Coagulation Profile: Recent Labs  Lab 03/01/22 1050  INR 1.6*    Cardiac Enzymes: No results for input(s): "CKTOTAL", "CKMB", "CKMBINDEX", "TROPONINI" in the last 168 hours.  HbA1C: No results found for: "HGBA1C"  CBG: Recent Labs  Lab 03/01/22 2040 03/01/22 2309 03/02/22 0348 03/02/22 0747  GLUCAP 95 125* 122* 109*       Past Medical History:  She,  has a past medical history of Anemia, Cirrhosis ([place]), DVT (deep venous thrombosis) ([place]), Low iron, Neuropathy, Neuropathy, Panic attacks, and Pulmonary embolus ([place]) (2019).   Surgical History:   Past  Surgical History:  Procedure Laterality Date   ABDOMINAL HYSTERECTOMY     BIOPSY N/A 06/24/2013   Procedure: BIOPSY TERMINAL ILEUM;  Surgeon: Daneil Dolin, MD;  Location: [facility];  Service: Endoscopy;  Laterality: N/A;   CHOLECYSTECTOMY     COLONOSCOPY N/A 06/24/2013   NFA:[id number] hemorrhoids/otherwise normal    COLONOSCOPY WITH PROPOFOL N/A 05/31/2021   Surgeon: Daneil Dolin, MD; ery large nonbleeding internal and external hemorrhoids, portal colopathy.  Recommended repeat colonoscopy in 10 years.   ESOPHAGOGASTRODUODENOSCOPY (EGD) WITH PROPOFOL N/A 03/29/2021   Surgeon: Eloise Harman, DO;  grade 1 esophageal varices, portal hypertensive gastropathy with friable tissue with spontaneous ooze, normal duodenum.   ESOPHAGOGASTRODUODENOSCOPY (EGD) WITH PROPOFOL N/A 05/31/2021   Surgeon: Daneil Dolin, MD;  grade 1-2 esophageal varices without bleeding stigmata, advanced appearing portal hypertensive gastropathy.   ESOPHAGOGASTRODUODENOSCOPY (EGD) WITH PROPOFOL N/A  12/22/2021   Procedure: ESOPHAGOGASTRODUODENOSCOPY (EGD) WITH PROPOFOL;  Surgeon: Dolores Frame, MD;  [facility];  Service: Gastroenterology;  Laterality: N/A;   nasal bone surgery     NASAL SINUS SURGERY     SINUS IRRIGATION     TONSILLECTOMY     TUBAL LIGATION       Social History:   reports that she has been smoking cigarettes. She has a 6.00 pack-year smoking history. She has never used smokeless tobacco. She reports that she does not currently use alcohol after a past usage of about 4.0 - 5.0 standard drinks of alcohol per week. She reports current drug use. Drug: Marijuana.   Family History:  Her family history includes COPD in her mother; Cancer in an other family member; Cirrhosis in her father; Crohn's disease in her maternal grandmother; Diabetes in her mother and sister; Hypertension in her mother and sister. There is no history of Colon cancer or Autoimmune disease.    Allergies Allergies  Allergen Reactions   Demerol Anaphylaxis    Per patient cardiac arrest     Home Medications  Prior to Admission medications   Medication Sig Start Date End Date Taking? Authorizing Provider  CONSTULOSE 10 GM/15ML solution Take 30 mLs (20 g total) by mouth 3 (three) times daily. 12/22/21  Yes Erick Blinks, MD  furosemide (LASIX) 40 MG tablet TAKE 1 TABLET BY MOUTH DAILY 01/02/22  Yes Gelene Mink, NP  gabapentin (NEURONTIN) 300 MG capsule Take 300 mg by mouth 3 (three) times daily as needed (nerve pain). 02/25/21  Yes [provider]  hydrOXYzine (VISTARIL) 25 MG capsule Take 25 mg by mouth 3 (three) times daily as needed for anxiety. 11/13/21  Yes [provider]  meclizine (ANTIVERT) 12.5 MG tablet Take 12.5 mg by mouth 3 (three) times daily as needed for dizziness or nausea. 02/23/22 03/25/22 Yes [provider]  methocarbamol (ROBAXIN) 500 MG tablet Take 500 mg by mouth at bedtime as needed for muscle spasms. 11/29/21  Yes [provider]  metoCLOPramide (REGLAN) 5 MG tablet Take 5 mg by mouth 2 (two) times daily as needed for nausea or vomiting. 01/14/22  Yes [provider]  ondansetron (ZOFRAN-ODT) 4 MG disintegrating tablet Take 1 tablet (4 mg total) by mouth every 8 (eight) hours as needed for nausea or vomiting. 12/03/21  Yes Gloris Manchester, MD  oxyCODONE (OXY IR/ROXICODONE) 5 MG immediate release tablet Take 5 mg by mouth every 4 (four) hours as needed for moderate pain. 02/23/22  Yes [provider]  pantoprazole (PROTONIX) 40 MG tablet Take 1 tablet (40 mg total) by mouth 2 (two) times daily before a meal. Patient taking differently: Take 40 mg by mouth daily. 03/30/21  Yes Erick Blinks, MD  promethazine (PHENERGAN) 12.5 MG tablet Take 12.5 mg by mouth 3 (three) times daily as needed for nausea or vomiting. 02/26/22  Yes [provider]  rifaximin (XIFAXAN) 550 MG TABS tablet Take 550 mg by mouth 2  (two) times daily. 02/23/22 05/24/22 Yes [provider]  spironolactone (ALDACTONE) 100 MG tablet TAKE 1 TABLET BY MOUTH DAILY 01/02/22  Yes Gelene Mink, NP     This patient is critically ill with multiple organ system failure; which, requires frequent high complexity decision making, assessment, support, evaluation, and titration of therapies. This was completed through the application of advanced monitoring technologies and extensive interpretation of multiple databases. During this encounter critical care time was devoted to patient care services described in  this note for 32 minutes.  Garner Nash, DO [facility] 03/02/2022 7:55 AM

## 2022-03-02 NOTE — Consult Note (Signed)
Consultation  Referring Provider: CCM/Icard Primary Care Physician:  Toma Deiters, MD Primary Gastroenterologist:  Dr. Jena Gauss  Reason for Consultation: Decompensated EtOH induced cirrhosis with severe hepatic encephalopathy requiring vent support  HPI: Erica Banks is a 42 y.o. female, followed by [place] [facility]/Dr. 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 She been seen by GI on the day of admission to [facility] and advised to go to the emergency room due to worsening mental status.  At that time she was semialert with mumbling, but unable to carry on a conversation.  She had had a very recent hospitalization at [facility] in late January with hepatic encephalopathy and had been discharged on Xifaxan and lactulose.  Not known at this time whether she was compliant with those medications. In the ER yesterday she had acute decompensation in mental status and due to concerns of inability to protect her airway she was intubated. CT of the head was done without any acute intracranial abnormality Drug screen was positive for THC and benzodiazepines. Unclear by review of notes whether she has still been using alcohol. Did have bedside diagnostic paracentesis at [facility] not consistent with SBP And chest x-ray yesterday with increased vascular markings but no consolidation or infiltrate MELD-Na 3.0= 5 yesterday Was transferred here last night.  She had additional paracentesis done last night with removal of 3 L of clear yellow appearing fluid-Gram stain and cell counts pending  She remains intubated, obtunded, non responsive.  She is receiving Xifaxan and is currently on lactulose 30 g every 6 hours, has not had any stools Has been hemodynamically stable overnight  Labs today WBC 15.1 down from  19.7 Hemoglobin 8.0/hematocrit 24.5/platelets 158 Sodium 133/BUN 25/creatinine 2.10 up from 1.88 yesterday Albumin 3.2 T. bili 2.3/alk phos 193/AST 95/ALT 48 Magnesium 1.5 Urine sodium and osmolality pending INR 1.6  Ammonia 289 yesterday     Past Medical History:  Diagnosis Date   Anemia    Cirrhosis (HCC)    DVT (deep venous thrombosis) (HCC)    Low iron    Neuropathy    Neuropathy    Panic attacks    Pulmonary embolus (HCC) 2019    Past Surgical History:  Procedure Laterality Date   ABDOMINAL HYSTERECTOMY     BIOPSY N/A 06/24/2013   Procedure: BIOPSY TERMINAL ILEUM;  Surgeon: Corbin Ade, MD;  [facility];  Service: Endoscopy;  Laterality: N/A;   CHOLECYSTECTOMY     COLONOSCOPY N/A 06/24/2013   XBM:[id number] hemorrhoids/otherwise normal    COLONOSCOPY WITH PROPOFOL N/A 05/31/2021   Surgeon: Corbin Ade, MD; ery large nonbleeding internal and external hemorrhoids, portal colopathy.  Recommended repeat colonoscopy in 10 years.   ESOPHAGOGASTRODUODENOSCOPY (EGD) WITH PROPOFOL N/A 03/29/2021   Surgeon: Lanelle Bal, DO;  grade 1 esophageal varices, portal hypertensive gastropathy with friable tissue with spontaneous ooze, normal duodenum.   ESOPHAGOGASTRODUODENOSCOPY (EGD) WITH PROPOFOL N/A 05/31/2021   Surgeon: Corbin Ade, MD;  grade 1-2 esophageal varices without bleeding stigmata, advanced appearing portal hypertensive gastropathy.   ESOPHAGOGASTRODUODENOSCOPY (EGD) WITH PROPOFOL N/A 12/22/2021   Procedure: ESOPHAGOGASTRODUODENOSCOPY (EGD) WITH PROPOFOL;  Surgeon: Dolores Frame, MD;  Location: [facility];  Service: Gastroenterology;  Laterality: N/A;   nasal bone surgery     NASAL SINUS SURGERY     SINUS IRRIGATION  TONSILLECTOMY     TUBAL LIGATION      Prior to Admission medications   Medication Sig Start Date End Date Taking? Authorizing Provider  CONSTULOSE 10 GM/15ML solution Take 30 mLs (20 g total) by mouth 3  (three) times daily. 12/22/21  Yes Kathie Dike, MD  furosemide (LASIX) 40 MG tablet TAKE 1 TABLET BY MOUTH DAILY 01/02/22  Yes Annitta Needs, NP  gabapentin (NEURONTIN) 300 MG capsule Take 300 mg by mouth 3 (three) times daily as needed (nerve pain). 02/25/21  Yes [provider]  hydrOXYzine (VISTARIL) 25 MG capsule Take 25 mg by mouth 3 (three) times daily as needed for anxiety. 11/13/21  Yes [provider]  meclizine (ANTIVERT) 12.5 MG tablet Take 12.5 mg by mouth 3 (three) times daily as needed for dizziness or nausea. 02/23/22 03/25/22 Yes [provider]  methocarbamol (ROBAXIN) 500 MG tablet Take 500 mg by mouth at bedtime as needed for muscle spasms. 11/29/21  Yes [provider]  metoCLOPramide (REGLAN) 5 MG tablet Take 5 mg by mouth 2 (two) times daily as needed for nausea or vomiting. 01/14/22  Yes [provider]  ondansetron (ZOFRAN-ODT) 4 MG disintegrating tablet Take 1 tablet (4 mg total) by mouth every 8 (eight) hours as needed for nausea or vomiting. 12/03/21  Yes Godfrey Pick, MD  oxyCODONE (OXY IR/ROXICODONE) 5 MG immediate release tablet Take 5 mg by mouth every 4 (four) hours as needed for moderate pain. 02/23/22  Yes [provider]  pantoprazole (PROTONIX) 40 MG tablet Take 1 tablet (40 mg total) by mouth 2 (two) times daily before a meal. Patient taking differently: Take 40 mg by mouth daily. 03/30/21  Yes Kathie Dike, MD  promethazine (PHENERGAN) 12.5 MG tablet Take 12.5 mg by mouth 3 (three) times daily as needed for nausea or vomiting. 02/26/22  Yes [provider]  rifaximin (XIFAXAN) 550 MG TABS tablet Take 550 mg by mouth 2 (two) times daily. 02/23/22 05/24/22 Yes [provider]  spironolactone (ALDACTONE) 100 MG tablet TAKE 1 TABLET BY MOUTH DAILY 01/02/22  Yes Annitta Needs, NP    Current Facility-Administered Medications  Medication Dose Route Frequency Provider Last Rate Last Admin   albumin human  25 % solution 25 g  25 g Intravenous Q6H Icard, Bradley L, DO       cefTRIAXone (ROCEPHIN) 2 g in sodium chloride 0.9 % 100 mL IVPB  2 g Intravenous Q24H Einar Grad, RPH 200 mL/hr at 03/02/22 7048 2 g at 03/02/22 8891   Chlorhexidine Gluconate Cloth 2 % PADS 6 each  6 each Topical Q0600 Candee Furbish, MD   6 each at 03/01/22 2130   docusate (COLACE) 50 MG/5ML liquid 100 mg  100 mg Per Tube BID Mick Sell, PA-C   100 mg at 03/02/22 0919   fentaNYL (SUBLIMAZE) bolus via infusion 50-100 mcg  50-100 mcg Intravenous Q15 min PRN Mick Sell, PA-C       fentaNYL 2551mcg in NS 238mL (70mcg/ml) infusion-PREMIX  50-200 mcg/hr Intravenous Continuous Mick Sell, PA-C 7.5 mL/hr at 03/02/22 0800 75 mcg/hr at 69/45/03 8882   folic acid (FOLVITE) tablet 1 mg  1 mg Per Tube Daily Andres Labrum D, PA-C   1 mg at 03/02/22 0920   heparin injection 5,000 Units  5,000 Units Subcutaneous Q8H Mick Sell, PA-C   5,000 Units at 03/02/22 8003   lactulose (CHRONULAC) 10 GM/15ML solution 30 g  30 g Per Tube Q6H  Rondel Baton, MD   30 g at 03/02/22 2595   midodrine (PROAMATINE) tablet 5 mg  5 mg Oral TID WC Icard, Bradley L, DO       multivitamin with minerals tablet 1 tablet  1 tablet Per Tube Daily Lidia Collum, PA-C   1 tablet at 03/02/22 0919   octreotide (SANDOSTATIN) 500 mcg in sodium chloride 0.9 % 250 mL (2 mcg/mL) infusion  50 mcg/hr Intravenous Continuous Icard, Bradley L, DO 25 mL/hr at 03/02/22 0935 50 mcg/hr at 03/02/22 0935   Oral care mouth rinse  15 mL Mouth Rinse Q2H Lorin Glass, MD   15 mL at 03/02/22 0845   Oral care mouth rinse  15 mL Mouth Rinse PRN Lorin Glass, MD       pantoprazole (PROTONIX) injection 40 mg  40 mg Intravenous Q24H Lidia Collum, PA-C   40 mg at 03/01/22 2232   polyethylene glycol (MIRALAX / GLYCOLAX) packet 17 g  17 g Per Tube Daily Lidia Collum, PA-C   17 g at 03/02/22 0919   propofol (DIPRIVAN) 1000 MG/100ML infusion  5-80 mcg/kg/min Intravenous  Continuous Rondel Baton, MD 15.98 mL/hr at 03/02/22 0800 40 mcg/kg/min at 03/02/22 0800   rifaximin (XIFAXAN) tablet 550 mg  550 mg Per Tube BID Lidia Collum, PA-C   550 mg at 03/02/22 6387   thiamine (VITAMIN B1) injection 100 mg  100 mg Intravenous Daily Lidia Collum, PA-C   100 mg at 03/02/22 5643   zinc sulfate capsule 220 mg  220 mg Oral Daily Icard, Bradley L, DO   220 mg at 03/02/22 0919    Allergies as of 03/01/2022 - Review Complete 03/01/2022  Allergen Reaction Noted   Demerol Anaphylaxis 10/12/2010    Family History  Problem Relation Age of Onset   Diabetes Mother    Hypertension Mother    COPD Mother    Cirrhosis Father        ETOH   Diabetes Sister    Hypertension Sister    Crohn's disease Maternal Grandmother    Cancer Other    Colon cancer Neg Hx    Autoimmune disease Neg Hx     Social History   Socioeconomic History   Marital status: Married    Spouse name: Not on file   Number of children: Not on file   Years of education: Not on file   Highest education level: Not on file  Occupational History   Occupation: Unemployed    Comment: wants to go to nursing school in fall 2015  Tobacco Use   Smoking status: Every Day    Packs/day: 0.50    Years: 12.00    Total pack years: 6.00    Types: Cigarettes   Smokeless tobacco: Never  Vaping Use   Vaping Use: Never used  Substance and Sexual Activity   Alcohol use: Not Currently    Alcohol/week: 4.0 - 5.0 standard drinks of alcohol    Types: 4 - 5 Cans of beer per week    Comment: None since February 2023.   Drug use: Yes    Types: Marijuana    Comment: occasional   Sexual activity: Yes    Birth control/protection: Surgical  Other Topics Concern   Not on file  Social History Narrative   Not on file   Social Determinants of Health   Financial Resource Strain: Not on file  Food Insecurity: No Food Insecurity (02/13/2022)   Hunger Vital Sign  Worried About Charity fundraiser in the Last  Year: Never true    [place] in the Last Year: Never true  Transportation Needs: No Transportation Needs (02/13/2022)   PRAPARE - Hydrologist (Medical): No    Lack of Transportation (Non-Medical): No  Physical Activity: Not on file  Stress: Not on file  Social Connections: Not on file  Intimate Partner Violence: Not At Risk (02/13/2022)   Humiliation, Afraid, Rape, and Kick questionnaire    Fear of Current or Ex-Partner: No    Emotionally Abused: No    Physically Abused: No    Sexually Abused: No    Review of Systems: Unable to offer  Physical Exam: Vital signs in last 24 hours: Temp:  [95.2 F (35.1 C)-98.8 F (37.1 C)] 98.4 F (36.9 C) (02/03 0800) Pulse Rate:  [76-116] 97 (02/03 0800) Resp:  [8-29] 15 (02/03 0800) BP: (101-173)/(60-102) 121/74 (02/03 0800) SpO2:  [95 %-100 %] 100 % (02/03 0800) FiO2 (%):  [40 %-50 %] 40 % (02/03 0850) Weight:  [69.1 kg-72.9 kg] 69.1 kg (02/03 0424)   General:   Obtunded well-developed, acute and chronically ill-appearing white female-on vent, temporal muscle wasting Eyes:  Sclera clear, no icterus.   Conjunctiva pink. Ears:  Normal auditory acuity. Nose:  No deformity, discharge,  or lesions. Mouth:  No deformity or lesions.   Neck:  Supple; no masses or thyromegaly. Lungs: Coarse breath sounds bilaterally  heart: Tachy regular rate and rhythm; no murmurs, clicks, rubs,  or gallops. Abdomen: Protuberant,, nontense ascites ,soft, umbilical hernia-unable to determine if tenderness Rectal:   Msk:  Symmetrical without gross deformities. . Pulses:  Normal pulses noted. Extremities: Thin, trace edema in the shin Neurologic: Obtunded Skin:  Intact without significant lesions or rashes..   Intake/Output from previous day: 02/02 0701 - 02/03 0700 In: 685.4 [I.V.:442.4; IV Piggyback:243] Out: 6712 [Urine:430] Intake/Output this shift: Total I/O In: 15.3 [I.V.:15.3] Out: -   Lab Results: Recent  Labs    03/01/22 1050 03/01/22 2252 03/02/22 0600  WBC 13.6* 19.7* 15.1*  HGB 9.2* 8.8* 8.0*  HCT 28.4* 27.6* 24.5*  PLT 186 192 158   BMET Recent Labs    03/01/22 1050 03/01/22 2252 03/02/22 0600  NA 130*  --  133*  K 4.1  --  4.0  CL 102  --  105  CO2 14*  --  17*  GLUCOSE 146*  --  94  BUN 23*  --  25*  CREATININE 1.55* 1.88* 2.10*  CALCIUM 8.1*  --  9.2   LFT Recent Labs    03/02/22 0600  PROT 5.7*  ALBUMIN 3.2*  AST 95*  ALT 48*  ALKPHOS 103  BILITOT 2.3*   PT/INR Recent Labs    03/01/22 1050  LABPROT 18.8*  INR 1.6*       IMPRESSION:  #82 42 year old white female with history of decompensated alcoholic cirrhosis with ascites, hepatic encephalopathy, history of esophageal varices.  Patient had recent admission a couple of weeks ago at [facility] with hepatic encephalopathy, discharged on lactulose and Xifaxan. Presented to [facility] yesterday with severe hepatic encephalopathy, had decline in mental status while in the emergency room and was electively intubated.  She has been obtunded since, unable to wean from vent   Prior history of SBP-diagnostic paracentesis and then bedside paracentesis last night not consistent with SBP She is on Rocephin  Trigger for worsening encephalopathy at this time is not clear,  unclear whether she was compliant with medications at home.  Also drug screen positive for benzos and THC  UA is abnormal, probable UTI  MELD-Na= 25 on admit MELD-Na = 24 today  Currently on lactulose 30 g every 6 hours, Xifaxan twice daily and zinc twice daily  #2 rise in creatinine since admission-concerning for HRS Urine sodium pending Nephrology consult pending She has been started on albumin every 6 hours, midodrine and octreotide  #3 anemia, normocytic-chronic # 4 esophageal varices-recent EGD November 2023 with grade 1-2 varices in the lower third of the esophagus    PLAN: Follow-up on Gram stain etc. from today's  paracentesis Urine culture-follow-up Nephrology consultation Agree with octreotide, midodrine and albumin Continue Xifaxan 550 twice daily, zinc twice daily Will increase lactulose to 30 g every 4 hours-if she does not start stooling will need lactulose enemas Flexi-Seal can be placed if needed for stool containment Continue IV PPI daily Repeat ammonia, follow daily INR  Nothing else to add to excellent care of [facility] will follow with you    Anshi Jalloh PA-C 03/02/2022, 9:43 AM

## 2022-03-02 NOTE — H&P (Signed)
PCCM:  Please see consult note dated 03/02/2022  Garner Nash, DO [facility] 03/02/2022 10:31 AM

## 2022-03-03 DIAGNOSIS — K7031 Alcoholic cirrhosis of liver with ascites: Secondary | ICD-10-CM | POA: Diagnosis not present

## 2022-03-03 DIAGNOSIS — N390 Urinary tract infection, site not specified: Secondary | ICD-10-CM | POA: Diagnosis not present

## 2022-03-03 DIAGNOSIS — K7682 Hepatic encephalopathy: Secondary | ICD-10-CM | POA: Diagnosis not present

## 2022-03-03 DIAGNOSIS — J9601 Acute respiratory failure with hypoxia: Secondary | ICD-10-CM | POA: Diagnosis not present

## 2022-03-03 LAB — BASIC METABOLIC PANEL
Anion gap: 10 (ref 5–15)
Anion gap: 10 (ref 5–15)
BUN: 29 mg/dL — ABNORMAL HIGH (ref 6–20)
BUN: 29 mg/dL — ABNORMAL HIGH (ref 6–20)
CO2: 19 mmol/L — ABNORMAL LOW (ref 22–32)
CO2: 19 mmol/L — ABNORMAL LOW (ref 22–32)
Calcium: 9 mg/dL (ref 8.9–10.3)
Calcium: 8.9 mg/dL (ref 8.9–10.3)
Chloride: 105 mmol/L (ref 98–111)
Chloride: 106 mmol/L (ref 98–111)
Creatinine, Ser: 2.11 mg/dL — ABNORMAL HIGH (ref 0.44–1.00)
Creatinine, Ser: 1.86 mg/dL — ABNORMAL HIGH (ref 0.44–1.00)
GFR, Estimated: 30 mL/min — ABNORMAL LOW (ref >60)
GFR, Estimated: 35 mL/min — ABNORMAL LOW (ref >60)
Glucose, Bld: 127 mg/dL — ABNORMAL HIGH (ref 70–99)
Glucose, Bld: 136 mg/dL — ABNORMAL HIGH (ref 70–99)
Potassium: 4.3 mmol/L (ref 3.5–5.1)
Potassium: 3.9 mmol/L (ref 3.5–5.1)
Sodium: 134 mmol/L — ABNORMAL LOW (ref 135–145)
Sodium: 135 mmol/L (ref 135–145)

## 2022-03-03 LAB — MAGNESIUM
Magnesium: 2.4 mg/dL (ref 1.7–2.4)
Magnesium: 2.4 mg/dL (ref 1.7–2.4)

## 2022-03-03 LAB — GLUCOSE, CAPILLARY
Glucose-Capillary: 147 mg/dL — ABNORMAL HIGH (ref 70–99)
Glucose-Capillary: 141 mg/dL — ABNORMAL HIGH (ref 70–99)
Glucose-Capillary: 125 mg/dL — ABNORMAL HIGH (ref 70–99)
Glucose-Capillary: 133 mg/dL — ABNORMAL HIGH (ref 70–99)
Glucose-Capillary: 135 mg/dL — ABNORMAL HIGH (ref 70–99)

## 2022-03-03 LAB — CRYOGLOBULIN: Cryoglobulin: UNDETERMINED

## 2022-03-03 LAB — PHOSPHORUS
Phosphorus: 7.5 mg/dL — ABNORMAL HIGH (ref 2.5–4.6)
Phosphorus: 6.8 mg/dL — ABNORMAL HIGH (ref 2.5–4.6)

## 2022-03-03 LAB — PROTIME-INR
INR: 1.9 — ABNORMAL HIGH (ref 0.8–1.2)
Prothrombin Time: 21.8 seconds — ABNORMAL HIGH (ref 11.4–15.2)

## 2022-03-03 MED ORDER — FREE WATER
100.0000 mL | Freq: Four times a day (QID) | Status: DC
  Administered 2022-03-03 – 2022-03-07 (×15): 100 mL

## 2022-03-03 MED ORDER — LACTULOSE ENEMA
300.0000 mL | Freq: Three times a day (TID) | ORAL | Status: AC
  Administered 2022-03-03 (×2): 300 mL via RECTAL
  Filled 2022-03-03 (×2): qty 300

## 2022-03-03 MED ORDER — POLYETHYLENE GLYCOL 3350 17 G PO PACK
17.0000 g | PACK | Freq: Four times a day (QID) | ORAL | Status: DC
  Administered 2022-03-03 – 2022-03-07 (×9): 17 g
  Filled 2022-03-03 (×10): qty 1

## 2022-03-03 MED ORDER — SODIUM CHLORIDE 0.9 % IV SOLN
1.0000 g | INTRAVENOUS | Status: DC
  Administered 2022-03-04 – 2022-03-05 (×2): 1 g via INTRAVENOUS
  Filled 2022-03-03 (×2): qty 10

## 2022-03-03 NOTE — Progress Notes (Signed)
NAME:  Erica Banks, MRN:  [id number], DOB:  03/09/1980, LOS: 2 ADMISSION DATE:  03/01/2022, CONSULTATION DATE:  2/2 REFERRING MD:  [facility] -ED / Dr Jarold Motto, CHIEF COMPLAINT:  ams   History of Present Illness:  60 yowf with Etoh Cirrhosis /ascites and h/o hep encephalopathy d/c from [facility] 1/29  to GI clinic 2/2 am  to schedule paracentesis but woke up confused > referred from GI to ER lethargic, risk of aspiration and need for rx with lactulose so electively intubated by EDP and request for consultation/ transfer to [facility] if bed available   Pertinent  Medical History  42 y.o. 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  Also with history of IDA following with hematology with prescription iron infusions, GERD, PE, DVT, neuropathy.   Past Medical History:  Diagnosis Date   Anemia    Cirrhosis (HCC)    DVT (deep venous thrombosis) (HCC)    Low iron    Neuropathy    Neuropathy    Panic attacks    Pulmonary embolus (HCC) 2019     Significant Hospital Events: Including procedures, antibiotic start and stop dates in addition to other pertinent events   Oral ET  2/2 >>> UDS  2/2   pos benzo's and THC  Scheduled Meds:  Chlorhexidine Gluconate Cloth  6 each Topical Q0600   feeding supplement (PROSource TF20)  60 mL Per Tube Daily   folic acid  1 mg Per Tube Daily   heparin  5,000 Units Subcutaneous Q8H   lactulose  30 g Per Tube Q4H   midodrine  5 mg Oral TID WC   multivitamin with minerals  1 tablet Per Tube Daily   mouth rinse  15 mL Mouth Rinse Q2H   pantoprazole (PROTONIX) IV  40 mg Intravenous Q24H   polyethylene glycol  17 g Per Tube Daily   rifaximin  550 mg Per Tube BID   thiamine (VITAMIN B1) injection  100 mg Intravenous Daily   zinc sulfate  220 mg Oral Daily   Continuous Infusions:  albumin human 25 g (03/03/22 0325)   cefTRIAXone (ROCEPHIN)  IV 2 g (03/02/22  0937)   feeding supplement (VITAL 1.5 CAL) 55 mL/hr at 03/03/22 0742   fentaNYL infusion INTRAVENOUS 100 mcg/hr (03/02/22 1800)   octreotide (SANDOSTATIN) 500 mcg in sodium chloride 0.9 % 250 mL (2 mcg/mL) infusion 50 mcg/hr (03/03/22 0446)   propofol (DIPRIVAN) infusion 50 mcg/kg/min (03/03/22 0747)   PRN Meds:.fentaNYL, mouth rinse   Interim History / Subjective:   Patient remains critically ill intubated on mechanical life support.  Objective   Blood pressure 135/65, pulse 99, temperature 97.9 F (36.6 C), resp. rate 20, height 5\' 6"  (1.676 m), weight 70.4 kg, SpO2 96 %.    Vent Mode: PRVC FiO2 (%):  [40 %-50 %] 50 % Set Rate:  [20 bmp] 20 bmp Vt Set:  [400 mL] 400 mL PEEP:  [5 cmH20] 5 cmH20 Plateau Pressure:  [19 cmH20-24 cmH20] 19 cmH20   Intake/Output Summary (Last 24 hours) at 03/03/2022 0801 Last data filed at 03/03/2022 05/02/2022 Gross per 24 hour  Intake 2080.27 ml  Output 485 ml  Net 1595.27 ml   Filed Weights   03/01/22 2046 03/02/22 0424 03/03/22 0500  Weight: 72.9 kg 69.1 kg 70.4 kg    Examination: General: Middle-aged female, intubated on mechanical life support, chronically ill-appearing, critically ill HEENT: Endotracheal tube in place, does  not open eyes to stimulation Extremities: Thin, muscle wasting present Abdomen: Severely distended, ascites present Heart: Regular rate rhythm, S1-S2 Lungs: Bilateral mechanically ventilated breath sounds    Assessment & Plan:   Acute hypoxemic respiratory failure requiring intubation mechanical ventilation Plan: Remains on adult mechanical vent support Wean PEEP and FiO2 to maintain sats above 90% SAT SBT as tolerated, mental status at this time precludes liberation from mechanical vent.  Acute metabolic toxic encephalopathy secondary to hepatic encephalopathy and hyperammonemia Acute on chronic decompensated liver cirrhosis secondary to alcohol use Restrictive respiratory physiology secondary to severe abdominal  distention and alcoholic ascites status post large-volume paracentesis Plan: Continue Rocephin ppx plus rifaximin Continue lactulose, increased dose yesterday and will start rectal lactulose today Will likely need a repeat paracentesis just to help with ventilation and volume management  Continue vitamin supplements, thiamine multivitamin and zinc.  Acute renal failure, at risk for hepatorenal syndrome Hyponatremia Hypoalbuminemia Plan: Continue midodrine plus octreotide   Labs   CBC: Recent Labs  Lab 03/01/22 1050 03/01/22 2252 03/02/22 0600  WBC 13.6* 19.7* 15.1*  HGB 9.2* 8.8* 8.0*  HCT 28.4* 27.6* 24.5*  MCV 90.7 91.7 89.7  PLT 186 192 329    Basic Metabolic Panel: Recent Labs  Lab 03/01/22 1050 03/01/22 2252 03/02/22 0600 03/02/22 1840 03/03/22 0556  NA 130*  --  133*  --   --   K 4.1  --  4.0  --   --   CL 102  --  105  --   --   CO2 14*  --  17*  --   --   GLUCOSE 146*  --  94  --   --   BUN 23*  --  25*  --   --   CREATININE 1.55* 1.88* 2.10*  --   --   CALCIUM 8.1*  --  9.2  --   --   MG  --   --  1.5* 2.2 2.4  PHOS  --   --  6.2* 6.3* 7.5*   GFR: Estimated Creatinine Clearance: 33.3 mL/min (A) (by C-G formula based on SCr of 2.1 mg/dL (H)). Recent Labs  Lab 03/01/22 1050 03/01/22 2252 03/02/22 0600  WBC 13.6* 19.7* 15.1*    Liver Function Tests: Recent Labs  Lab 03/01/22 1050 03/02/22 0600  AST 115* 95*  ALT 51* 48*  ALKPHOS 131* 103  BILITOT 2.5* 2.3*  PROT 5.8* 5.7*  ALBUMIN 2.9* 3.2*   No results for input(s): "LIPASE", "AMYLASE" in the last 168 hours. Recent Labs  Lab 03/01/22 1050 03/02/22 1041  AMMONIA 289* 133*    ABG    Component Value Date/Time   PHART 7.38 03/01/2022 1656   PCO2ART 30 (L) 03/01/2022 1656   PO2ART 160 (H) 03/01/2022 1656   HCO3 18.1 (L) 03/01/2022 1656   ACIDBASEDEF 6.6 (H) 03/01/2022 1656   O2SAT 100 03/01/2022 1656     Coagulation Profile: Recent Labs  Lab 03/01/22 1050 03/02/22 1041  03/02/22 1248 03/03/22 0556  INR 1.6* 1.8* 1.8* 1.9*    Cardiac Enzymes: No results for input(s): "CKTOTAL", "CKMB", "CKMBINDEX", "TROPONINI" in the last 168 hours.  HbA1C: No results found for: "HGBA1C"  CBG: Recent Labs  Lab 03/02/22 1525 03/02/22 1931 03/02/22 2315 03/03/22 0332 03/03/22 0755  GLUCAP 105* 114* 154* 147* 141*     Past Medical History:  She,  has a past medical history of Anemia, Cirrhosis ([place]), DVT (deep venous thrombosis) (HCC), Low iron, Neuropathy, Neuropathy, Panic attacks, and  Pulmonary embolus ([place]) (2019).   Surgical History:   Past Surgical History:  Procedure Laterality Date   ABDOMINAL HYSTERECTOMY     BIOPSY N/A 06/24/2013   Procedure: BIOPSY TERMINAL ILEUM;  Surgeon: Daneil Dolin, MD;  Location: [facility];  Service: Endoscopy;  Laterality: N/A;   CHOLECYSTECTOMY     COLONOSCOPY N/A 06/24/2013   DGL:OVFIEPPI hemorrhoids/otherwise normal    COLONOSCOPY WITH PROPOFOL N/A 05/31/2021   Surgeon: Daneil Dolin, MD; ery large nonbleeding internal and external hemorrhoids, portal colopathy.  Recommended repeat colonoscopy in 10 years.   ESOPHAGOGASTRODUODENOSCOPY (EGD) WITH PROPOFOL N/A 03/29/2021   Surgeon: Eloise Harman, DO;  grade 1 esophageal varices, portal hypertensive gastropathy with friable tissue with spontaneous ooze, normal duodenum.   ESOPHAGOGASTRODUODENOSCOPY (EGD) WITH PROPOFOL N/A 05/31/2021   Surgeon: Daneil Dolin, MD;  grade 1-2 esophageal varices without bleeding stigmata, advanced appearing portal hypertensive gastropathy.   ESOPHAGOGASTRODUODENOSCOPY (EGD) WITH PROPOFOL N/A 12/22/2021   Procedure: ESOPHAGOGASTRODUODENOSCOPY (EGD) WITH PROPOFOL;  Surgeon: Harvel Quale, MD;  Location: [facility];  Service: Gastroenterology;  Laterality: N/A;   nasal bone surgery     NASAL SINUS SURGERY     SINUS IRRIGATION     TONSILLECTOMY     TUBAL LIGATION       Social History:   reports that she has been  smoking cigarettes. She has a 6.00 pack-year smoking history. She has never used smokeless tobacco. She reports that she does not currently use alcohol after a past usage of about 4.0 - 5.0 standard drinks of alcohol per week. She reports current drug use. Drug: Marijuana.   Family History:  Her family history includes COPD in her mother; Cancer in an other family member; Cirrhosis in her father; Crohn's disease in her maternal grandmother; Diabetes in her mother and sister; Hypertension in her mother and sister. There is no history of Colon cancer or Autoimmune disease.   Allergies Allergies  Allergen Reactions   Demerol Anaphylaxis    Per patient cardiac arrest     Home Medications  Prior to Admission medications   Medication Sig Start Date End Date Taking? Authorizing Provider  CONSTULOSE 10 GM/15ML solution Take 30 mLs (20 g total) by mouth 3 (three) times daily. 12/22/21  Yes Kathie Dike, MD  furosemide (LASIX) 40 MG tablet TAKE 1 TABLET BY MOUTH DAILY 01/02/22  Yes Annitta Needs, NP  gabapentin (NEURONTIN) 300 MG capsule Take 300 mg by mouth 3 (three) times daily as needed (nerve pain). 02/25/21  Yes [provider]  hydrOXYzine (VISTARIL) 25 MG capsule Take 25 mg by mouth 3 (three) times daily as needed for anxiety. 11/13/21  Yes [provider]  meclizine (ANTIVERT) 12.5 MG tablet Take 12.5 mg by mouth 3 (three) times daily as needed for dizziness or nausea. 02/23/22 03/25/22 Yes [provider]  methocarbamol (ROBAXIN) 500 MG tablet Take 500 mg by mouth at bedtime as needed for muscle spasms. 11/29/21  Yes [provider]  metoCLOPramide (REGLAN) 5 MG tablet Take 5 mg by mouth 2 (two) times daily as needed for nausea or vomiting. 01/14/22  Yes [provider]  ondansetron (ZOFRAN-ODT) 4 MG disintegrating tablet Take 1 tablet (4 mg total) by mouth every 8 (eight) hours as needed for nausea or vomiting. 12/03/21  Yes Godfrey Pick, MD  oxyCODONE  (OXY IR/ROXICODONE) 5 MG immediate release tablet Take 5 mg by mouth every 4 (four) hours as needed for moderate pain. 02/23/22  Yes [provider]  pantoprazole (PROTONIX) 40 MG tablet Take 1 tablet (40 mg total) by mouth 2 (two) times daily before a meal. Patient taking differently: Take 40 mg by mouth daily. 03/30/21  Yes Kathie Dike, MD  promethazine (PHENERGAN) 12.5 MG tablet Take 12.5 mg by mouth 3 (three) times daily as needed for nausea or vomiting. 02/26/22  Yes [provider]  rifaximin (XIFAXAN) 550 MG TABS tablet Take 550 mg by mouth 2 (two) times daily. 02/23/22 05/24/22 Yes [provider]  spironolactone (ALDACTONE) 100 MG tablet TAKE 1 TABLET BY MOUTH DAILY 01/02/22  Yes Annitta Needs, NP     This patient is critically ill with multiple organ system failure; which, requires frequent high complexity decision making, assessment, support, evaluation, and titration of therapies. This was completed through the application of advanced monitoring technologies and extensive interpretation of multiple databases. During this encounter critical care time was devoted to patient care services described in this note for 32 minutes.  [facility] 03/03/2022 8:01 AM

## 2022-03-03 NOTE — Progress Notes (Signed)
eLink Physician-Brief Progress Note Patient Name: Erica Banks DOB: 1980-02-26 MRN: [id number]   Date of Service  03/03/2022  HPI/Events of Note  PT getting lactulose via OG, also with lactulose enemas ordered  eICU Interventions  Placed order for rectal tube placement to facilitate administration of enemas, nursing care.         [place] 03/03/2022, 9:36 PM

## 2022-03-03 NOTE — Progress Notes (Signed)
[facility] Progress Note   42 y.o. female history of PE, DVT, neuropathy, alcoholic cirrhosis with recurrent ascites, esophageal varices, hepatic encephalopathy d/c from [facility] recently with GI follow clinic where noted patient was confused. Patient transferred to [facility] as she looked very ill, was encephalopathic, not following commands or answering questions, intubated for airway protection. She is on daily ciprofloxacin for SBP prophylaxis started also on rocephin and rifaximin.  She is on Lasix 40mg  daiy at home.  Cr was 0.86 on 1/20. Patient now noted to have a BUN/Cr of 25/2.1 with a Na of 133. NH3 289 up from 27 on 1/19.    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  DDx includes HRS vs ATN vs prerenal azotemia vs MPGN. No obstruction on renal ultrasound. - RBCs WBCs in urine likely from foley trauma but also sending for serologic workup.  Hepatitis neg. Difficult to make the diagnosis of HRS in the setting of hematuria and leukocyturia but this is likely secondary to trauma as the prior U/A on 02/13/22 was negative for blood or LE. FeNA <1% c/w HRS.  - Agree with octreotide and Midodrine; no absolute indication for dialysis. And if this is HRS T1 even with dialysis mortality is very high. Certainly may be possible to require dialysis during this hospitalization. Fortunately renal function is relatively stable. Updated sister who was bedside.   --Maintain MAP>65 for optimal renal perfusion.  - Avoid nephrotoxic medications including NSAIDs and iodinated intravenous contrast exposure unless the latter is absolutely indicated.   - Preferred narcotic agents for pain control are hydromorphone, fentanyl, and methadone. Morphine should not be used.  - Avoid Baclofen and avoid oral sodium phosphate and magnesium citrate based laxatives / bowel preps.  - Continue strict Input and Output  monitoring. Will monitor the patient closely with you and intervene or adjust therapy as indicated by changes in clinical status/labs   ETOH liver cirrhosis with hepatic encephalopathy on rifaximin R/o SBP + LVP 3L of clear/yellow fluid removed on 2/2 on Abx therapy as well. H/o PE  Subjective:   No events overnight; another LVP 2/4 w/ 3L collected.   Objective:   BP (!) 120/53   Pulse 87   Temp (!) 96.6 F (35.9 C)   Resp (!) 21   Ht 5\' 6"  (1.676 m)   Wt 70.4 kg   LMP  (LMP Unknown)   SpO2 96%   BMI 25.05 kg/m   Intake/Output Summary (Last 24 hours) at 03/03/2022 1302 Last data filed at 03/03/2022 1200 Gross per 24 hour  Intake 1744.13 ml  Output 650 ml  Net 1094.13 ml   Weight change: -2.5 kg  Physical Exam: General appearance: intubated Head: NCAT Eyes: no scleral icterus noted Back: negative Resp: mechanical BS Cardio: regular rate and rhythm GI: distended Extremities: edema 1+ Pulses: 2+ and symmetric  Imaging: US RENAL  Result Date: 03/02/2022 CLINICAL DATA:  Renal failure.  History of cirrhosis and ascites. EXAM: RENAL / URINARY TRACT ULTRASOUND COMPLETE COMPARISON:  CT of the abdomen on 02/13/2022 FINDINGS: Right Kidney: Renal measurements: 10.3 x 5.0 x 5.2 cm = volume: 133 mL. No hydronephrosis. Mildly increased cortical echogenicity. No focal lesions or shadowing calculi. Left Kidney: Renal measurements: 11.0 x 4.7 x 5.0 cm = volume: 136 mL. No hydronephrosis. Mildly increased cortical echogenicity. No focal lesions or shadowing calculi. Bladder: Not visualized due to  moderate ascites. Other: Moderate ascites present in the peritoneal cavity. IMPRESSION: 1. Mildly increased cortical echogenicity of both kidneys consistent with chronic kidney disease. No evidence of hydronephrosis. 2. Moderate ascites in the peritoneal cavity. Electronically Signed   By: Aletta Edouard M.D.   On: 03/02/2022 14:50   Portable Chest x-ray  Result Date: 03/01/2022 CLINICAL DATA:   Intubated EXAM: PORTABLE CHEST 1 VIEW COMPARISON:  03/01/2022 FINDINGS: Single frontal view of the chest demonstrates interval retraction of the endotracheal tube, which overlies tracheal air column 3 cm above carina. Enteric catheter passes below diaphragm, tip projecting over the gastric body. Cardiac silhouette is unremarkable. Lung volumes are diminished, without airspace disease, effusion, or pneumothorax. No acute bony abnormality. IMPRESSION: 1. Interval retraction of the endotracheal tube, tip now 3 cm above carina. 2. Low lung volumes. Electronically Signed   By: Randa Ngo M.D.   On: 03/01/2022 21:58   DG Abd 1 View  Result Date: 03/01/2022 CLINICAL DATA:  Evaluate OG tube placement EXAM: ABDOMEN - 1 VIEW COMPARISON:  02/13/2022 FINDINGS: Interval placement of enteric tube with tip in the expected location of the body of stomach. Side port is well below the GE junction. No dilated loops of small or large bowel. IMPRESSION: Tip of enteric tube is in the body of stomach. Electronically Signed   By: Kerby Moors M.D.   On: 03/01/2022 14:08   DG Chest Portable 1 View  Result Date: 03/01/2022 CLINICAL DATA:  Status post intubation. EXAM: PORTABLE CHEST 1 VIEW COMPARISON:  February 20, 2022. FINDINGS: The heart size and mediastinal contours are within normal limits. Both lungs are clear. Endotracheal tube is noted with distal tip 1 cm above the carina. The visualized skeletal structures are unremarkable. IMPRESSION: Endotracheal tube is noted with distal tip 1 cm above the carina. Withdrawal by 2-3 cm is recommended. Electronically Signed   By: Marijo Conception M.D.   On: 03/01/2022 13:45    Labs: BMET Recent Labs  Lab 03/01/22 1050 03/01/22 2252 03/02/22 0600 03/02/22 1840 03/03/22 0549 03/03/22 0556  NA 130*  --  133*  --  134*  --   K 4.1  --  4.0  --  4.3  --   CL 102  --  105  --  105  --   CO2 14*  --  17*  --  19*  --   GLUCOSE 146*  --  94  --  127*  --   BUN 23*  --  25*  --   29*  --   CREATININE 1.55* 1.88* 2.10*  --  2.11*  --   CALCIUM 8.1*  --  9.2  --  9.0  --   PHOS  --   --  6.2* 6.3*  --  7.5*   CBC Recent Labs  Lab 03/01/22 1050 03/01/22 2252 03/02/22 0600  WBC 13.6* 19.7* 15.1*  HGB 9.2* 8.8* 8.0*  HCT 28.4* 27.6* 24.5*  MCV 90.7 91.7 89.7  PLT 186 192 158    Medications:     Chlorhexidine Gluconate Cloth  6 each Topical Q0600   feeding supplement (PROSource TF20)  60 mL Per Tube Daily   folic acid  1 mg Per Tube Daily   free water  100 mL Per Tube Q6H   heparin  5,000 Units Subcutaneous Q8H   lactulose  30 g Per Tube Q4H   lactulose  300 mL Rectal Q8H   midodrine  5 mg Oral TID WC  multivitamin with minerals  1 tablet Per Tube Daily   mouth rinse  15 mL Mouth Rinse Q2H   pantoprazole (PROTONIX) IV  40 mg Intravenous Q24H   polyethylene glycol  17 g Per Tube QID   rifaximin  550 mg Per Tube BID   thiamine (VITAMIN B1) injection  100 mg Intravenous Daily   zinc sulfate  220 mg Oral Daily      Otelia Santee, MD 03/03/2022, 1:02 PM

## 2022-03-03 NOTE — Progress Notes (Signed)
RN noticed patient getting TF at 34ml/hr despite documentation of infusion at 13ml/hr. Per RD notes, TF not infusing at the rate ordered prior to this RN's shift. Tube feeding color/consistency from ET Tube during standard oral care. I called Elink RN and stopped tube feeds. Will closely monitor CBGs.

## 2022-03-03 NOTE — Progress Notes (Addendum)
Patient ID: Erica Banks,, sedated on vent   DOB: 01-25-1981, 42 y.o.   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  Prior history of SBP  MELD-Na 24 On high-dose lactulose every 4 hours, Xifaxan twice daily zinc twice daily Octreotide/midodrine/albumin Tube feeding started  Patient remains on vent, remains on fentanyl and propofol She has not been responding per staff despite high-dose lactulose has not had any bowel movements  Ammonia yesterday down to 133 INR 1.9 NA134/BUN//29/creatinine 2.11     Objective   Vital signs in last 24 hours: Temp:  [96.8 F (36 C)-99.5 F (37.5 C)] 96.8 F (36 C) (02/04 1121) Pulse Rate:  [89-99] 94 (02/04 0800) Resp:  [11-25] 20 (02/04 0800) BP: (120-138)/(58-79) 126/58 (02/04 0800) SpO2:  [91 %-100 %] 100 % (02/04 0800) FiO2 (%):  [50 %] 50 % (02/04 0700) Weight:  [70.4 kg] 70.4 kg (02/04 0500) Last BM Date :  (prior to arrival) General: Chronically ill-appearing white female, sedated on vent Heart:  Regular rate and rhythm; no murmurs Lungs: Respirations even and unlabored, breath sounds bilaterally Abdomen:  Soft, two-parent, nontense ascites, large.  Bowel sounds quiet Extremities: Trace to 1+ lower extremity edema Neurologic: Sedated, nonresponsive on vent  Intake/Output from previous day: 02/03 0701 - 02/04 0700 In: 1342.1 [I.V.:526.1; NG/GT:547; IV Piggyback:269] Out: 485 [Urine:485] Intake/Output this shift: Total I/O In: 753.5 [NG/GT:753.5] Out: -   Lab Results: Recent Labs    03/01/22 1050 03/01/22 2252 03/02/22 0600  WBC 13.6* 19.7* 15.1*  HGB 9.2* 8.8* 8.0*  HCT 28.4* 27.6* 24.5*  PLT 186 192 158   BMET Recent Labs    03/01/22 1050 03/01/22 2252 03/02/22 0600 03/03/22 0549  NA 130*  --  133* 134*  K 4.1  --  4.0 4.3  CL  102  --  105 105  CO2 14*  --  17* 19*  GLUCOSE 146*  --  94 127*  BUN 23*  --  25* 29*  CREATININE 1.55* 1.88* 2.10* 2.11*  CALCIUM 8.1*  --  9.2 9.0   LFT Recent Labs    03/02/22 0600  PROT 5.7*  ALBUMIN 3.2*  AST 95*  ALT 48*  ALKPHOS 103  BILITOT 2.3*   PT/INR Recent Labs    03/02/22 1248 03/03/22 0556  LABPROT 21.1* 21.8*  INR 1.8* 1.9*    Studies/Results: US RENAL  Result Date: 03/02/2022 CLINICAL DATA:  Renal failure.  History of cirrhosis and ascites. EXAM: RENAL / URINARY TRACT ULTRASOUND COMPLETE COMPARISON:  CT of the abdomen on 02/13/2022 FINDINGS: Right Kidney: Renal measurements: 10.3 x 5.0 x 5.2 cm = volume: 133 mL. No hydronephrosis. Mildly increased cortical echogenicity. No focal lesions or shadowing calculi. Left Kidney: Renal measurements: 11.0 x 4.7 x 5.0 cm = volume: 136 mL. No hydronephrosis. Mildly increased cortical echogenicity. No focal lesions or shadowing calculi. Bladder: Not visualized due to moderate ascites. Other: Moderate ascites present in the peritoneal cavity. IMPRESSION: 1. Mildly increased cortical echogenicity of both kidneys consistent with chronic kidney disease. No evidence of hydronephrosis. 2. Moderate ascites in the peritoneal cavity. Electronically Signed   By: Aletta Edouard M.D.   On: 03/02/2022 14:50   Portable Chest x-ray  Result Date: 03/01/2022 CLINICAL DATA:  Intubated EXAM: PORTABLE CHEST 1 VIEW COMPARISON:  03/01/2022 FINDINGS: Single frontal view of the chest demonstrates  interval retraction of the endotracheal tube, which overlies tracheal air column 3 cm above carina. Enteric catheter passes below diaphragm, tip projecting over the gastric body. Cardiac silhouette is unremarkable. Lung volumes are diminished, without airspace disease, effusion, or pneumothorax. No acute bony abnormality. IMPRESSION: 1. Interval retraction of the endotracheal tube, tip now 3 cm above carina. 2. Low lung volumes. Electronically Signed   By:  Randa Ngo M.D.   On: 03/01/2022 21:58   DG Abd 1 View  Result Date: 03/01/2022 CLINICAL DATA:  Evaluate OG tube placement EXAM: ABDOMEN - 1 VIEW COMPARISON:  02/13/2022 FINDINGS: Interval placement of enteric tube with tip in the expected location of the body of stomach. Side port is well below the GE junction. No dilated loops of small or large bowel. IMPRESSION: Tip of enteric tube is in the body of stomach. Electronically Signed   By: Kerby Moors M.D.   On: 03/01/2022 14:08   DG Chest Portable 1 View  Result Date: 03/01/2022 CLINICAL DATA:  Status post intubation. EXAM: PORTABLE CHEST 1 VIEW COMPARISON:  February 20, 2022. FINDINGS: The heart size and mediastinal contours are within normal limits. Both lungs are clear. Endotracheal tube is noted with distal tip 1 cm above the carina. The visualized skeletal structures are unremarkable. IMPRESSION: Endotracheal tube is noted with distal tip 1 cm above the carina. Withdrawal by 2-3 cm is recommended. Electronically Signed   By: Marijo Conception M.D.   On: 03/01/2022 13:45   CT Head Wo Contrast  Result Date: 03/01/2022 CLINICAL DATA:  Mental status change, unknown cause. History of liver disease. EXAM: CT HEAD WITHOUT CONTRAST TECHNIQUE: Contiguous axial images were obtained from the base of the skull through the vertex without intravenous contrast. RADIATION DOSE REDUCTION: This exam was performed according to the departmental dose-optimization program which includes automated exposure control, adjustment of the mA and/or kV according to patient size and/or use of iterative reconstruction technique. COMPARISON:  Head CT 11/09/2008. FINDINGS: Moderate motion artifact despite repeat. Brain: No acute hemorrhage, mass effect or midline shift. Gray-white differentiation is preserved. No hydrocephalus. No extra-axial collection. Basilar cisterns are patent. Vascular: No hyperdense vessel or unexpected calcification. Skull: No calvarial fracture or  suspicious bone lesion. Skull base is unremarkable. Sinuses/Orbits: Paranasal sinuses, mastoid air cells, and middle ear cavities are well aerated. Orbits are unremarkable. Other: None. IMPRESSION: Motion degraded study. No acute intracranial abnormality identified. Electronically Signed   By: Emmit Alexanders M.D.   On: 03/01/2022 12:52       Assessment / Plan:    #62 42 year old white female with history of decompensated alcoholic cirrhosis with ascites, hepatic encephalopathy, history of esophageal varices and history of previous SBP. Patient had an admission at [facility] a few weeks ago with hepatic encephalopathy and was discharged on lactulose and Xifaxan Presented to [facility] on 03/01/2022 with severe hepatic encephalopathy and acute decline in mental status following the emergency room and therefore electively intubated.  She has been obtunded since but has also been on fentanyl and propofol to aid with vent management  Diagnostic paracentesis at [facility] not consistent with SBP, she had an additional 3 L removed.  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  1-2 varices lower third of the esophagus  Plan; await urine culture may need to broaden antibiotics Continue Rocephin for now Lactulose enemas today Continue high-dose lactulose as above, twice daily Xifaxan and twice daily zinc  Need to start trying to decrease sedation in  order to better assess mental status.      Principal Problem:   Hepatic encephalopathy ([place]) Active Problems:   Acute hepatic encephalopathy (HCC)   Acute respiratory failure with hypoxemia (HCC)     LOS: 2 days   Kielan Dreisbach PA-C 03/03/2022, 11:45 AM

## 2022-03-03 NOTE — Procedures (Signed)
Paracentesis Procedure Note  Erica Banks  [id number]  March 14, 1980  Date:03/03/22  Time:12:43 PM   Provider Performing:Ashok Sawaya D. Harris    Procedure: Paracentesis with imaging guidance ([id number])  Indication(s) Ascites  Consent Risks of the procedure as well as the alternatives and risks of each were explained to the patient and/or caregiver.  Consent for the procedure was obtained and is signed in the bedside chart  Anesthesia Topical only with 1% lidocaine    Time Out Verified patient identification, verified procedure, site/side was marked, verified correct patient position, special equipment/implants available, medications/allergies/relevant history reviewed, required imaging and test results available.   Sterile Technique Maximal sterile technique including full sterile barrier drape, hand hygiene, sterile gown, sterile gloves, mask, hair covering, sterile ultrasound probe cover (if used).   Procedure Description Ultrasound used to identify appropriate peritoneal anatomy for placement and overlying skin marked.  Area of drainage cleaned and draped in sterile fashion. Lidocaine was used to anesthetize the skin and subcutaneous tissue.  3000 cc's of amber appearing fluid was drained. Catheter then removed and bandaid applied to site.   Complications/Tolerance None; patient tolerated the procedure well.   EBL Minimal   Specimen(s) None   Michon Kaczmarek D. Harris, NP-C [facility] Personal contact information can be found on Amion  If no contact or response made please call [phone] 03/03/2022, 12:44 PM

## 2022-03-04 ENCOUNTER — Inpatient Hospital Stay (HOSPITAL_COMMUNITY): Payer: Medicaid Other

## 2022-03-04 DIAGNOSIS — K7682 Hepatic encephalopathy: Secondary | ICD-10-CM | POA: Diagnosis not present

## 2022-03-04 DIAGNOSIS — K7031 Alcoholic cirrhosis of liver with ascites: Secondary | ICD-10-CM

## 2022-03-04 DIAGNOSIS — N179 Acute kidney failure, unspecified: Secondary | ICD-10-CM

## 2022-03-04 DIAGNOSIS — K7290 Hepatic failure, unspecified without coma: Secondary | ICD-10-CM

## 2022-03-04 LAB — GLUCOSE, CAPILLARY
Glucose-Capillary: 113 mg/dL — ABNORMAL HIGH (ref 70–99)
Glucose-Capillary: 118 mg/dL — ABNORMAL HIGH (ref 70–99)
Glucose-Capillary: 118 mg/dL — ABNORMAL HIGH (ref 70–99)
Glucose-Capillary: 118 mg/dL — ABNORMAL HIGH (ref 70–99)
Glucose-Capillary: 131 mg/dL — ABNORMAL HIGH (ref 70–99)
Glucose-Capillary: 137 mg/dL — ABNORMAL HIGH (ref 70–99)
Glucose-Capillary: 152 mg/dL — ABNORMAL HIGH (ref 70–99)

## 2022-03-04 LAB — BASIC METABOLIC PANEL
Anion gap: 9 (ref 5–15)
Anion gap: 10 (ref 5–15)
BUN: 31 mg/dL — ABNORMAL HIGH (ref 6–20)
BUN: 31 mg/dL — ABNORMAL HIGH (ref 6–20)
CO2: 19 mmol/L — ABNORMAL LOW (ref 22–32)
CO2: 17 mmol/L — ABNORMAL LOW (ref 22–32)
Calcium: 9 mg/dL (ref 8.9–10.3)
Calcium: 9.3 mg/dL (ref 8.9–10.3)
Chloride: 107 mmol/L (ref 98–111)
Chloride: 108 mmol/L (ref 98–111)
Creatinine, Ser: 1.74 mg/dL — ABNORMAL HIGH (ref 0.44–1.00)
Creatinine, Ser: 1.18 mg/dL — ABNORMAL HIGH (ref 0.44–1.00)
GFR, Estimated: 38 mL/min — ABNORMAL LOW (ref >60)
GFR, Estimated: 60 mL/min — ABNORMAL LOW (ref >60)
Glucose, Bld: 133 mg/dL — ABNORMAL HIGH (ref 70–99)
Glucose, Bld: 123 mg/dL — ABNORMAL HIGH (ref 70–99)
Potassium: 3.4 mmol/L — ABNORMAL LOW (ref 3.5–5.1)
Potassium: 3.8 mmol/L (ref 3.5–5.1)
Sodium: 135 mmol/L (ref 135–145)
Sodium: 135 mmol/L (ref 135–145)

## 2022-03-04 LAB — URINE CULTURE: Culture: >=100000 — AB

## 2022-03-04 LAB — PHOSPHORUS
Phosphorus: 5.7 mg/dL — ABNORMAL HIGH (ref 2.5–4.6)
Phosphorus: 3.4 mg/dL (ref 2.5–4.6)

## 2022-03-04 LAB — HEPATIC FUNCTION PANEL
ALT: 32 U/L (ref 0–44)
AST: 73 U/L — ABNORMAL HIGH (ref 15–41)
Albumin: 4.4 g/dL (ref 3.5–5.0)
Alkaline Phosphatase: 78 U/L (ref 38–126)
Bilirubin, Direct: 0.8 mg/dL — ABNORMAL HIGH (ref 0.0–0.2)
Indirect Bilirubin: 0.8 mg/dL (ref 0.3–0.9)
Total Bilirubin: 1.6 mg/dL — ABNORMAL HIGH (ref 0.3–1.2)
Total Protein: 6.1 g/dL — ABNORMAL LOW (ref 6.5–8.1)

## 2022-03-04 LAB — BODY FLUID CULTURE W GRAM STAIN: Culture: NO GROWTH

## 2022-03-04 LAB — PROTIME-INR
INR: 2.1 — ABNORMAL HIGH (ref 0.8–1.2)
Prothrombin Time: 23.1 seconds — ABNORMAL HIGH (ref 11.4–15.2)

## 2022-03-04 LAB — MAGNESIUM
Magnesium: 2.4 mg/dL (ref 1.7–2.4)
Magnesium: 2.4 mg/dL (ref 1.7–2.4)

## 2022-03-04 LAB — C4 COMPLEMENT: Complement C4, Body Fluid: 10 mg/dL — ABNORMAL LOW (ref 12–38)

## 2022-03-04 LAB — C3 COMPLEMENT: C3 Complement: 61 mg/dL — ABNORMAL LOW (ref 82–167)

## 2022-03-04 LAB — AMMONIA: Ammonia: 133 umol/L — ABNORMAL HIGH (ref 9–35)

## 2022-03-04 MED ORDER — ALBUMIN HUMAN 25 % IV SOLN
25.0000 g | Freq: Four times a day (QID) | INTRAVENOUS | Status: AC
  Administered 2022-03-04 (×2): 25 g via INTRAVENOUS
  Filled 2022-03-04: qty 100

## 2022-03-04 MED ORDER — POTASSIUM CHLORIDE 20 MEQ PO PACK
40.0000 meq | PACK | Freq: Once | ORAL | Status: AC
  Administered 2022-03-04: 40 meq
  Filled 2022-03-04: qty 2

## 2022-03-04 MED ORDER — LACTULOSE 10 GM/15ML PO SOLN
20.0000 g | Freq: Three times a day (TID) | ORAL | Status: DC
  Administered 2022-03-04 – 2022-03-06 (×8): 20 g
  Filled 2022-03-04 (×9): qty 30

## 2022-03-04 MED ORDER — ZINC SULFATE 220 (50 ZN) MG PO CAPS
220.0000 mg | ORAL_CAPSULE | Freq: Every day | ORAL | Status: DC
  Administered 2022-03-05 – 2022-03-14 (×9): 220 mg
  Filled 2022-03-04 (×9): qty 1

## 2022-03-04 MED ORDER — VITAL 1.5 CAL PO LIQD
1000.0000 mL | ORAL | Status: DC
  Administered 2022-03-04 – 2022-03-05 (×3): 1000 mL

## 2022-03-04 MED ORDER — MIDODRINE HCL 5 MG PO TABS
5.0000 mg | ORAL_TABLET | Freq: Three times a day (TID) | ORAL | Status: DC
  Administered 2022-03-04 – 2022-03-07 (×9): 5 mg
  Filled 2022-03-04 (×10): qty 1

## 2022-03-04 MED ORDER — SODIUM CHLORIDE 0.9 % IV SOLN: INTRAVENOUS | Status: DC | PRN

## 2022-03-04 NOTE — Progress Notes (Signed)
[facility] Progress Note   42 y.o. female history of PE, DVT, neuropathy, alcoholic cirrhosis with recurrent ascites, esophageal varices, hepatic encephalopathy d/c from [facility] recently with GI follow clinic where noted patient was confused. Patient transferred to [facility] as she looked very ill, was encephalopathic, not following commands or answering questions, intubated for airway protection. She is on daily ciprofloxacin for SBP prophylaxis started also on rocephin and rifaximin.  She is on Lasix 40mg  daiy at home.  Cr was 0.86 on 1/20. Patient now noted to have a BUN/Cr of 25/2.1 with a Na of 133. NH3 289 up from 27 on 1/19.    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  DDx includes HRS vs ATN vs prerenal azotemia vs MPGN. No obstruction on renal ultrasound. - RBCs WBCs in urine likely from foley trauma but also sending for serologic workup.  Hepatitis neg, cryos resent, C3/C4 pending.   Difficult to make the diagnosis of HRS in the setting of hematuria and leukocyturia but this is likely secondary to trauma as the prior U/A on 02/13/22 was negative for blood or LE. FeNA <1% c/w HRS.  - Agree with octreotide and Midodrine; no absolute indication for dialysis and kidney function is improving. And if this is HRS T1 even with dialysis mortality is very high. Certainly may be possible to require dialysis during this hospitalization. Fortunately renal function is relatively stable. Dr. Augustin Coupe updated sister over weekend   --Maintain MAP>65 for optimal renal perfusion.  - Avoid nephrotoxic medications including NSAIDs and iodinated intravenous contrast exposure unless the latter is absolutely indicated.   - Preferred narcotic agents for pain control are hydromorphone, fentanyl, and methadone. Morphine should not be used.  - Avoid Baclofen and avoid oral sodium phosphate and magnesium citrate based  laxatives / bowel preps.  - Continue strict Input and Output monitoring. Will monitor the patient closely with you and intervene or adjust therapy as indicated by changes in clinical status/labs   ETOH liver cirrhosis with hepatic encephalopathy on rifaximin R/o SBP + LVP 3L of clear/yellow fluid removed on 2/2 on Abx therapy as well. H/o PE Hypokalemia repleted  Subjective:   No events overnight.  S/p 2.3L paracentesis yesterday. UOP 447mL yesterday, 482mL already today.  Cr 1.9 > 1.7mg /dL, K 3.4 this AM., repleted   Objective:   BP (!) 124/53   Pulse 85   Temp (!) 97.5 F (36.4 C) (Bladder)   Resp 19   Ht 5\' 6"  (1.676 m)   Wt 67.2 kg   LMP  (LMP Unknown)   SpO2 100%   BMI 23.91 kg/m   Intake/Output Summary (Last 24 hours) at 03/04/2022 0751 Last data filed at 03/04/2022 0600 Gross per 24 hour  Intake 3954.73 ml  Output 5180 ml  Net -1225.27 ml    Weight change: -3.2 kg  Physical Exam: General appearance: intubated Head: NCAT Eyes: no scleral icterus noted Resp: mechanical BS Cardio: regular rate and rhythm GI: distended but not tense, no TTP Extremities: edema 1+ Pulses: 2+ and symmetric  Imaging: US RENAL  Result Date: 03/02/2022 CLINICAL DATA:  Renal failure.  History of cirrhosis and ascites. EXAM: RENAL / URINARY TRACT ULTRASOUND COMPLETE COMPARISON:  CT of the abdomen on 02/13/2022 FINDINGS: Right Kidney: Renal measurements: 10.3 x 5.0 x 5.2 cm = volume: 133 mL. No hydronephrosis. Mildly increased cortical echogenicity. No focal lesions or  shadowing calculi. Left Kidney: Renal measurements: 11.0 x 4.7 x 5.0 cm = volume: 136 mL. No hydronephrosis. Mildly increased cortical echogenicity. No focal lesions or shadowing calculi. Bladder: Not visualized due to moderate ascites. Other: Moderate ascites present in the peritoneal cavity. IMPRESSION: 1. Mildly increased cortical echogenicity of both kidneys consistent with chronic kidney disease. No evidence of  hydronephrosis. 2. Moderate ascites in the peritoneal cavity. Electronically Signed   By: Aletta Edouard M.D.   On: 03/02/2022 14:50    Labs: BMET Recent Labs  Lab 03/01/22 1050 03/01/22 2252 03/02/22 0600 03/02/22 1840 03/03/22 0549 03/03/22 0556 03/03/22 1632 03/04/22 0050  NA 130*  --  133*  --  134*  --  135 135  K 4.1  --  4.0  --  4.3  --  3.9 3.4*  CL 102  --  105  --  105  --  106 107  CO2 14*  --  17*  --  19*  --  19* 19*  GLUCOSE 146*  --  94  --  127*  --  136* 133*  BUN 23*  --  25*  --  29*  --  29* 31*  CREATININE 1.55* 1.88* 2.10*  --  2.11*  --  1.86* 1.74*  CALCIUM 8.1*  --  9.2  --  9.0  --  8.9 9.0  PHOS  --   --  6.2* 6.3*  --  7.5* 6.8* 5.7*    CBC Recent Labs  Lab 03/01/22 1050 03/01/22 2252 03/02/22 0600  WBC 13.6* 19.7* 15.1*  HGB 9.2* 8.8* 8.0*  HCT 28.4* 27.6* 24.5*  MCV 90.7 91.7 89.7  PLT 186 192 158     Medications:     Chlorhexidine Gluconate Cloth  6 each Topical Q0600   feeding supplement (PROSource TF20)  60 mL Per Tube Daily   folic acid  1 mg Per Tube Daily   free water  100 mL Per Tube Q6H   heparin  5,000 Units Subcutaneous Q8H   lactulose  30 g Per Tube Q4H   midodrine  5 mg Oral TID WC   multivitamin with minerals  1 tablet Per Tube Daily   mouth rinse  15 mL Mouth Rinse Q2H   pantoprazole (PROTONIX) IV  40 mg Intravenous Q24H   polyethylene glycol  17 g Per Tube QID   rifaximin  550 mg Per Tube BID   thiamine (VITAMIN B1) injection  100 mg Intravenous Daily   zinc sulfate  220 mg Oral Daily   Jannifer Hick MD [facility] Pager [phone]

## 2022-03-04 NOTE — Progress Notes (Signed)
[facility] ADULT ICU REPLACEMENT PROTOCOL   The patient does apply for the [facility] Adult ICU Electrolyte Replacment Protocol based on the criteria listed below:   1.Exclusion criteria: TCTS, ECMO, Dialysis, and Myasthenia Gravis patients 2. Is GFR >/= 30 ml/min? Yes.    Patient's GFR today is 38 3. Is SCr </= 2? Yes.   Patient's SCr is 1.74 mg/dL 4. Did SCr increase >/= 0.5 in 24 hours? No. 5.Pt's weight >40kg  Yes.   6. Abnormal electrolyte(s): K+ 3.4  7. Electrolytes replaced per protocol 8.  Call MD STAT for K+ </= 2.5, Phos </= 1, or Mag </= 1 Physician:  Dr. Burnard Leigh  Carlisle Beers 03/04/2022 4:59 AM

## 2022-03-04 NOTE — TOC Initial Note (Signed)
Transition of Care [facility]) - Initial/Assessment Note    Patient Details  Name: Erica Banks MRN: [id number] Date of Birth: 11-07-1980  [facility]) CM/SW Contact:    Cyndi Bender, RN Phone Number: 03/04/2022, 1:22 PM  Clinical Narrative:                   Decompensated EtOH induced cirrhosis with severe hepatic encephalopathy requiring vent support. TOC following.   Patient Goals and CMS Choice            Expected Discharge Plan and Services                                              Prior Living Arrangements/Services                       Activities of Daily Living      Permission Sought/Granted                  Emotional Assessment              Admission diagnosis:  Hepatic encephalopathy ([place]) [K76.82] Decompensated hepatic cirrhosis ([place]) [K72.90, K74.60] Glasgow coma scale total score 3-8, at arrival to emergency department [facility]) [M19.6222] Patient Active Problem List   Diagnosis Date Noted   Acute respiratory failure with hypoxemia ([place]) 03/01/2022   Hepatic encephalopathy ([place]) 03/01/2022   Transaminitis 02/14/2022   Abdominal pain 02/14/2022   Acute hepatic encephalopathy ([place]) 02/10/2022   Hemorrhoids 02/10/2022   Generalized weakness 12/19/2021   AKI (acute kidney injury) ([place]) 12/19/2021   Hyperammonemia ([place]) 12/19/2021   Hypomagnesemia 12/19/2021   Ascites due to alcoholic cirrhosis ([place]) 97/98/9211   Hypocalcemia 11/23/2021   Hypoalbuminemia 11/23/2021   Tobacco use disorder 11/23/2021   Esophageal varices without bleeding ([place]) 10/31/2021   Heartburn 08/23/2021   Nausea without vomiting 08/23/2021   Lesion of liver less than 1 cm in diameter    Acute blood loss anemia 94/17/4081   Alcoholic cirrhosis of liver with ascites ([place]) 03/27/2021   Hyponatremia 03/27/2021   Iron deficiency anemia 03/27/2021   Folate deficiency 03/27/2021   Ascites    Cirrhosis of liver with ascites ([place])     Intractable nausea and vomiting 07/20/2019   Cholelithiasis 07/20/2019   Prolonged QT interval 07/20/2019   Anemia 01/01/2015   Symptomatic anemia 01/01/2015   Hypokalemia 01/01/2015   Acute gastroenteritis 01/01/2015   Abnormal CT of the abdomen 06/08/2013   Gastroesophageal reflux disease 06/08/2013   PCP:  Neale Burly, MD Pharmacy:   [place], [place] [address] [place] [address] Phone: [phone] Fax: [phone]     Social Determinants of Health (SDOH) Social History: SDOH Screenings   Food Insecurity: No Food Insecurity (02/13/2022)  Housing: Low Risk  (02/13/2022)  Transportation Needs: No Transportation Needs (02/13/2022)  Utilities: Not At Risk (02/13/2022)  Tobacco Use: High Risk (03/01/2022)   SDOH Interventions:     Readmission Risk Interventions    02/14/2022    2:11 PM 12/19/2021    8:54 AM  Readmission Risk Prevention Plan  Transportation Screening Complete Complete  HRI or Home Care Consult Complete Complete  Social Work Consult for [place] Planning/Counseling Complete Complete  Palliative Care Screening Not Applicable Not Applicable  Medication Review [employer] photographer) Complete  Complete

## 2022-03-04 NOTE — Progress Notes (Addendum)
Attending physician's note   I have reviewed the chart, examined the patient, and discussed her care with her family members (husband and sister) at bedside. I performed a substantive portion of this encounter, including complete performance of at least one of the key components, in conjunction with the APP. I agree with the APP's note, impression, and recommendations with my edits.   Intubated and presently on sedation holiday.  Sister and husband at bedside and discussed her care at length with them today.  Tube feeds were stopped due to concern for backing up into ET tube.  Remains critically ill with decompensated EtOH cirrhosis c/b ascites, varices, h/o SBP (no SBP on most recent LVP), hepatic encephalopathy, with superimposed Klebsiella UTI, AKI (vs possible HRS).  - Continue lactulose, rifaximin - Continue Rocephin - Continue midodrine and octreotide - Her sister indicates that the patient is son may be a match for living donor transplant.  We discussed some of those transplant possibilities and timelines briefly today. - Again, remains critically ill with overall poor prognosis.  GI service will continue to follow   Gerrit Heck, DO, FACG [phone] office          Progress Note  Primary GI: Dr. Gala Romney    Subjective  Chief Complaint:Decompensated EtOH induced cirrhosis with severe hepatic encephalopathy requiring vent support   Patient with family at bedside, sister Clyde Canterbury. Provided some of the history.  Patient on ventilation, decreasing sedation. Received high-dose lactulose as well as lactulose enemas yesterday and has had 1000 cc output overnight of stool. Nurse had some concern that tube feeding color and consistency was in the ET tube.  Tube feedings were stopped due to concern for aspiration.    Objective   Vital signs in last 24 hours: Temp:  [96.4 F (35.8 C)-99.7 F (37.6 C)] 97.7 F (36.5 C) (02/05 0900) Pulse Rate:  [84-97] 89 (02/05  0900) Resp:  [12-28] 19 (02/05 0900) BP: (116-137)/(49-60) 127/56 (02/05 0900) SpO2:  [95 %-100 %] 100 % (02/05 0900) FiO2 (%):  [40 %-50 %] 40 % (02/05 0820) Weight:  [67.2 kg] 67.2 kg (02/05 0444) Last BM Date : 03/04/22 Last BM recorded by nurses in past 5 days Stool Type: Type 7 (Liquid consistency with no solid pieces) (03/04/2022  8:00 AM)  General: Chronically ill-appearing female, sedated on vent Heart:  Regular rate and rhythm; no murmurs Pulm: Clear anteriorly; no wheezing, intubated Abdomen:  Distended, soft abdomen with ascites large volume, Sluggish bowel sounds.  Extremities:  with 1-2+ edema. Neurologic: Sedated, nonresponsive on vent  Intake/Output from previous day: 02/04 0701 - 02/05 0700 In: 4708.2 [I.V.:1829.9; YJ/EH:6314.9; IV Piggyback:331] Out: 7026 [Urine:880; Stool:2000] Intake/Output this shift: Total I/O In: 120.6 [I.V.:120.6] Out: -   Studies/Results: US RENAL  Result Date: 03/02/2022 CLINICAL DATA:  Renal failure.  History of cirrhosis and ascites. EXAM: RENAL / URINARY TRACT ULTRASOUND COMPLETE COMPARISON:  CT of the abdomen on 02/13/2022 FINDINGS: Right Kidney: Renal measurements: 10.3 x 5.0 x 5.2 cm = volume: 133 mL. No hydronephrosis. Mildly increased cortical echogenicity. No focal lesions or shadowing calculi. Left Kidney: Renal measurements: 11.0 x 4.7 x 5.0 cm = volume: 136 mL. No hydronephrosis. Mildly increased cortical echogenicity. No focal lesions or shadowing calculi. Bladder: Not visualized due to moderate ascites. Other: Moderate ascites present in the peritoneal cavity. IMPRESSION: 1. Mildly increased cortical echogenicity of both kidneys consistent with chronic kidney disease. No evidence of hydronephrosis. 2. Moderate ascites in the peritoneal cavity. Electronically Signed  By: Aletta Edouard M.D.   On: 03/02/2022 14:50    Lab Results: Recent Labs    03/01/22 1050 03/01/22 2252 03/02/22 0600  WBC 13.6* 19.7* 15.1*  HGB 9.2* 8.8* 8.0*   HCT 28.4* 27.6* 24.5*  PLT 186 192 158   BMET Recent Labs    03/03/22 0549 03/03/22 1632 03/04/22 0050  NA 134* 135 135  K 4.3 3.9 3.4*  CL 105 106 107  CO2 19* 19* 19*  GLUCOSE 127* 136* 133*  BUN 29* 29* 31*  CREATININE 2.11* 1.86* 1.74*  CALCIUM 9.0 8.9 9.0   LFT Recent Labs    03/02/22 0600  PROT 5.7*  ALBUMIN 3.2*  AST 95*  ALT 48*  ALKPHOS 103  BILITOT 2.3*   PT/INR Recent Labs    03/03/22 0556 03/04/22 0050  LABPROT 21.8* 23.1*  INR 1.9* 2.1*     Scheduled Meds: . Chlorhexidine Gluconate Cloth  6 each Topical Q0600  . feeding supplement (PROSource TF20)  60 mL Per Tube Daily  . folic acid  1 mg Per Tube Daily  . free water  100 mL Per Tube Q6H  . heparin  5,000 Units Subcutaneous Q8H  . lactulose  20 g Per Tube TID  . midodrine  5 mg Per Tube TID WC  . multivitamin with minerals  1 tablet Per Tube Daily  . mouth rinse  15 mL Mouth Rinse Q2H  . pantoprazole (PROTONIX) IV  40 mg Intravenous Q24H  . polyethylene glycol  17 g Per Tube QID  . rifaximin  550 mg Per Tube BID  . thiamine (VITAMIN B1) injection  100 mg Intravenous Daily  . [START ON 03/05/2022] zinc sulfate  220 mg Per Tube Daily   Continuous Infusions: . sodium chloride 10 mL/hr at 03/04/22 0904  . albumin human 25 g (03/04/22 1000)  . cefTRIAXone (ROCEPHIN)  IV 1 g (03/04/22 0909)  . feeding supplement (VITAL 1.5 CAL) Stopped (03/03/22 2208)  . fentaNYL infusion INTRAVENOUS Stopped (03/04/22 0835)  . octreotide (SANDOSTATIN) 500 mcg in sodium chloride 0.9 % 250 mL (2 mcg/mL) infusion 50 mcg/hr (03/04/22 0909)  . propofol (DIPRIVAN) infusion 10 mcg/kg/min (03/04/22 0909)      Patient profile:   42 year old female history of decompensated alcoholic cirrhosis with ascites, hepatic encephalopathy, esophageal varices and history of previous SBP, presented 03/01/2018 for severe hepatic encephalopathy,    Impression/Plan:   Decompensated cirrhosis secondary to ETOH WBC 15.1 HGB 8.0 MCV  89.7 Platelets 158 AST 95 ALT 48  Alkphos 103 TBili 2.3 INR 2.1 MELD-Na: 24 (24 yesterday) -Daily CBC, CMET, INR -Empiric treatment with folic acid, multivitamin and thiamine.  --No role for liver transplantation given ongoing drug use - Overall guarded prognosis, continue goals of care discussion with the family.  Severe Hepatic Encephalopathy, intubated on fentanyl and propofol on 10 mcg.  Possible trigger UTI, on rocephin- pending urine culture 03/04/2022 Ammonia 133 ( 133) ( 289) With addition of lactulose enemas, has had 1000 cc BM over night .  - Lactulose 82ml q 4 hours per day per Tube - Rifaximin 550mg  bid per tube - add on zinc tube - continue to try to minimize/remove all benzos and sedatives, appreciate CCM help in trying to back off sedation  Acute renal failure likely hepatorenal syndrome BUN 31 Cr 1.74 ( 1.86) (2.11)  nephrology following.  -Avoid large volume paracentesis, s/p 3 L paracentesis yesterday -Diuretics on hold for the time being, timing of adding back on per nephrology -getting  today 25 mg every 6 hours twice -patient on Octreotide with Midodrine with stable/improving renal function - guarded prognosis  Ascites Recent paracentesis SBP negative 3 L paracentesis yesterday  Some concern for tube feedings and ET tube Can consider adding on Reglan for motility, prior EGD had large amount of food residue in the stomach, possible component of gastroparesis Continue medications per feeding tube especially Rocephin/lactulose  History of esophageal varices 12/22/2021 EGD with Dr.Castaneda grade 1-2 varices  Anemia chronic No overt GI bleeding, monitor closely, slowly down trending  Recent Labs    02/10/22 1723 02/11/22 0558 02/13/22 1636 02/14/22 0415 02/15/22 0342 02/15/22 0832 02/15/22 1659 03/01/22 1050 03/01/22 2252 03/02/22 0600  HGB 7.1* 8.4* 8.3* 7.2* 7.1* 7.8* 7.6* 9.2* 8.8* 8.0*  CBC on 03/02/2022   HGB 8.0 MCV 89.7 Platelets 158 Anemia  studies on 02/15/2022  Iron 21 Ferritin 47 B12 937  Principal Problem:   Hepatic encephalopathy ([place]) Active Problems:   Acute hepatic encephalopathy (HCC)   Acute respiratory failure with hypoxemia ([place])    LOS: 3 days   Vladimir Crofts  03/04/2022, 10:35 AM

## 2022-03-04 NOTE — Progress Notes (Signed)
NAME:  Erica Banks, MRN:  [id number], DOB:  [birth year], LOS: 3 ADMISSION DATE:  03/01/2022, CONSULTATION DATE:  2/2 REFERRING MD:  [facility] -ED / Dr Sharlett Iles, CHIEF COMPLAINT:  ams   History of Present Illness:  60 yowf with Etoh Cirrhosis /ascites and h/o hep encephalopathy d/c from [facility] 1/29  to GI clinic 2/2 am  to schedule paracentesis but woke up confused > referred from GI to ER lethargic, risk of aspiration and need for rx with lactulose so electively intubated by EDP and request for consultation/ transfer to [facility] if bed available   Pertinent  Medical History  42 y.o. 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  Also with history of IDA following with hematology with prescription iron infusions, GERD, PE, DVT, neuropathy.   Past Medical History:  Diagnosis Date   Anemia    Cirrhosis ([place])    DVT (deep venous thrombosis) (HCC)    Low iron    Neuropathy    Neuropathy    Panic attacks    Pulmonary embolus ([place]) 2019     Significant Hospital Events: Including procedures, antibiotic start and stop dates in addition to other pertinent events   Oral ET  2/2 >>> UDS  2/2   pos benzo's and THC  Scheduled Meds:  Chlorhexidine Gluconate Cloth  6 each Topical Q0600   feeding supplement (PROSource TF20)  60 mL Per Tube Daily   folic acid  1 mg Per Tube Daily   free water  100 mL Per Tube Q6H   heparin  5,000 Units Subcutaneous Q8H   lactulose  30 g Per Tube Q4H   midodrine  5 mg Oral TID WC   multivitamin with minerals  1 tablet Per Tube Daily   mouth rinse  15 mL Mouth Rinse Q2H   pantoprazole (PROTONIX) IV  40 mg Intravenous Q24H   polyethylene glycol  17 g Per Tube QID   rifaximin  550 mg Per Tube BID   thiamine (VITAMIN B1) injection  100 mg Intravenous Daily   zinc sulfate  220 mg Oral Daily   Continuous Infusions:  sodium chloride 10 mL/hr at 03/04/22 0904    albumin human 25 g (03/04/22 0436)   cefTRIAXone (ROCEPHIN)  IV 1 g (03/04/22 0909)   feeding supplement (VITAL 1.5 CAL) Stopped (03/03/22 2208)   fentaNYL infusion INTRAVENOUS Stopped (03/04/22 0835)   octreotide (SANDOSTATIN) 500 mcg in sodium chloride 0.9 % 250 mL (2 mcg/mL) infusion 50 mcg/hr (03/04/22 0909)   propofol (DIPRIVAN) infusion 10 mcg/kg/min (03/04/22 0909)   PRN Meds:.sodium chloride, fentaNYL, mouth rinse   Interim History / Subjective:   Paracentesis yesterday with 3L removed, not sent for labs  Concern for aspiration of TF, stopped overnight  Decent urine output  Objective   Blood pressure (!) 127/56, pulse 89, temperature 97.7 F (36.5 C), resp. rate 19, height 5\' 6"  (1.676 m), weight 67.2 kg, SpO2 100 %.    Vent Mode: PRVC FiO2 (%):  [40 %-50 %] 40 % Set Rate:  [20 bmp] 20 bmp Vt Set:  [400 mL] 400 mL PEEP:  [5 cmH20] 5 cmH20 Plateau Pressure:  [7 cmH20-14 cmH20] 13 cmH20   Intake/Output Summary (Last 24 hours) at 03/04/2022 0947 Last data filed at 03/04/2022 0909 Gross per 24 hour  Intake 4075.31 ml  Output 5180 ml  Net -1104.69 ml   Filed Weights   03/02/22 0424 03/03/22 0500 03/04/22  0444  Weight: 69.1 kg 70.4 kg 67.2 kg    Examination: General: chronically ill-appearing HEENT: ETT ub okace, does not open eyes to stimulation Extremities: Thin, muscle wasting present Abdomen: distended, ascites present Heart: Regular rate rhythm, S1-S2 Lungs: Bilateral mechanically ventilated breath sounds, equal chest rise    Assessment & Plan:   Acute hypoxemic respiratory failure requiring intubation mechanical ventilation Plan: Full vent support Wean PEEP and FiO2 to maintain sats above 90% SAT SBT as tolerated, mental status at this time precludes liberation from mechanical vent.  Severe Sepsis due to Klebsiella UTI with AKI Ceftriaxone, follow cultures and narrow as able  Acute metabolic toxic encephalopathy secondary to hepatic encephalopathy and  hyperammonemia Acute on chronic decompensated liver cirrhosis secondary to alcohol use Restrictive respiratory physiology secondary to severe abdominal distention and alcoholic ascites status post large-volume paracentesis Plan: Continue Rocephin ppx plus rifaximin Continue lactulose target 1L stool output Continue vitamin supplements, thiamine multivitamin and zinc  Acute renal failure, at risk for hepatorenal syndrome, improving Hyponatremia Hypoalbuminemia Plan: Continue midodrine plus octreotide Stop albumin today Nephro following  GoC: Per sister her last drink was 1 year ago. Fortunately renal function seems to be improving but if there is deterioration in that regard or if we're having trouble clearing her PSE may need to consider transfer for expedited transplant workup.   Labs   CBC: Recent Labs  Lab 03/01/22 1050 03/01/22 2252 03/02/22 0600  WBC 13.6* 19.7* 15.1*  HGB 9.2* 8.8* 8.0*  HCT 28.4* 27.6* 24.5*  MCV 90.7 91.7 89.7  PLT 186 192 158    Basic Metabolic Panel: Recent Labs  Lab 03/01/22 1050 03/01/22 2252 03/02/22 0600 03/02/22 1840 03/03/22 0549 03/03/22 0556 03/03/22 1632 03/04/22 0050  NA 130*  --  133*  --  134*  --  135 135  K 4.1  --  4.0  --  4.3  --  3.9 3.4*  CL 102  --  105  --  105  --  106 107  CO2 14*  --  17*  --  19*  --  19* 19*  GLUCOSE 146*  --  94  --  127*  --  136* 133*  BUN 23*  --  25*  --  29*  --  29* 31*  CREATININE 1.55* 1.88* 2.10*  --  2.11*  --  1.86* 1.74*  CALCIUM 8.1*  --  9.2  --  9.0  --  8.9 9.0  MG  --   --  1.5* 2.2  --  2.4 2.4 2.4  PHOS  --   --  6.2* 6.3*  --  7.5* 6.8* 5.7*   GFR: Estimated Creatinine Clearance: 40.2 mL/min (A) (by C-G formula based on SCr of 1.74 mg/dL (H)). Recent Labs  Lab 03/01/22 1050 03/01/22 2252 03/02/22 0600  WBC 13.6* 19.7* 15.1*    Liver Function Tests: Recent Labs  Lab 03/01/22 1050 03/02/22 0600  AST 115* 95*  ALT 51* 48*  ALKPHOS 131* 103  BILITOT 2.5*  2.3*  PROT 5.8* 5.7*  ALBUMIN 2.9* 3.2*   No results for input(s): "LIPASE", "AMYLASE" in the last 168 hours. Recent Labs  Lab 03/01/22 1050 03/02/22 1041 03/04/22 0050  AMMONIA 289* 133* 133*    ABG    Component Value Date/Time   PHART 7.38 03/01/2022 1656   PCO2ART 30 (L) 03/01/2022 1656   PO2ART 160 (H) 03/01/2022 1656   HCO3 18.1 (L) 03/01/2022 1656   ACIDBASEDEF 6.6 (H) 03/01/2022 1656  O2SAT 100 03/01/2022 1656     Coagulation Profile: Recent Labs  Lab 03/01/22 1050 03/02/22 1041 03/02/22 1248 03/03/22 0556 03/04/22 0050  INR 1.6* 1.8* 1.8* 1.9* 2.1*    Cardiac Enzymes: No results for input(s): "CKTOTAL", "CKMB", "CKMBINDEX", "TROPONINI" in the last 168 hours.  HbA1C: No results found for: "HGBA1C"  CBG: Recent Labs  Lab 03/03/22 1554 03/03/22 1922 03/03/22 2318 03/04/22 0323 03/04/22 0752  GLUCAP 137* 133* 135* 131* 118*     Past Medical History:  She,  has a past medical history of Anemia, Cirrhosis ([place]), DVT (deep venous thrombosis) ([place]), Low iron, Neuropathy, Neuropathy, Panic attacks, and Pulmonary embolus ([place]) (2019).   Surgical History:   Past Surgical History:  Procedure Laterality Date   ABDOMINAL HYSTERECTOMY     BIOPSY N/A 06/24/2013   Procedure: BIOPSY TERMINAL ILEUM;  Surgeon: Daneil Dolin, MD;  Location: [facility];  Service: Endoscopy;  Laterality: N/A;   CHOLECYSTECTOMY     COLONOSCOPY N/A 06/24/2013   [id number] hemorrhoids/otherwise normal    COLONOSCOPY WITH PROPOFOL N/A 05/31/2021   Surgeon: Daneil Dolin, MD; ery large nonbleeding internal and external hemorrhoids, portal colopathy.  Recommended repeat colonoscopy in 10 years.   ESOPHAGOGASTRODUODENOSCOPY (EGD) WITH PROPOFOL N/A 03/29/2021   Surgeon: Eloise Harman, DO;  grade 1 esophageal varices, portal hypertensive gastropathy with friable tissue with spontaneous ooze, normal duodenum.   ESOPHAGOGASTRODUODENOSCOPY (EGD) WITH PROPOFOL N/A 05/31/2021    Surgeon: Daneil Dolin, MD;  grade 1-2 esophageal varices without bleeding stigmata, advanced appearing portal hypertensive gastropathy.   ESOPHAGOGASTRODUODENOSCOPY (EGD) WITH PROPOFOL N/A 12/22/2021   Procedure: ESOPHAGOGASTRODUODENOSCOPY (EGD) WITH PROPOFOL;  Surgeon: Harvel Quale, MD;  Location: [facility];  Service: Gastroenterology;  Laterality: N/A;   nasal bone surgery     NASAL SINUS SURGERY     SINUS IRRIGATION     TONSILLECTOMY     TUBAL LIGATION       Social History:   reports that she has been smoking cigarettes. She has a 6.00 pack-year smoking history. She has never used smokeless tobacco. She reports that she does not currently use alcohol after a past usage of about 4.0 - 5.0 standard drinks of alcohol per week. She reports current drug use. Drug: Marijuana.   Family History:  Her family history includes COPD in her mother; Cancer in an other family member; Cirrhosis in her father; Crohn's disease in her maternal grandmother; Diabetes in her mother and sister; Hypertension in her mother and sister. There is no history of Colon cancer or Autoimmune disease.   Allergies Allergies  Allergen Reactions   Demerol Anaphylaxis    Per patient cardiac arrest     Home Medications  Prior to Admission medications   Medication Sig Start Date End Date Taking? Authorizing Provider  CONSTULOSE 10 GM/15ML solution Take 30 mLs (20 g total) by mouth 3 (three) times daily. 12/22/21  Yes Kathie Dike, MD  furosemide (LASIX) 40 MG tablet TAKE 1 TABLET BY MOUTH DAILY 01/02/22  Yes Annitta Needs, NP  gabapentin (NEURONTIN) 300 MG capsule Take 300 mg by mouth 3 (three) times daily as needed (nerve pain). 02/25/21  Yes [provider]  hydrOXYzine (VISTARIL) 25 MG capsule Take 25 mg by mouth 3 (three) times daily as needed for anxiety. 11/13/21  Yes [provider]  meclizine (ANTIVERT) 12.5 MG tablet Take 12.5 mg by mouth 3 (three) times daily as needed for  dizziness or nausea. 02/23/22 03/25/22 Yes [provider]  methocarbamol (ROBAXIN) 500 MG tablet Take 500 mg by mouth at bedtime as needed for muscle spasms. 11/29/21  Yes [provider]  metoCLOPramide (REGLAN) 5 MG tablet Take 5 mg by mouth 2 (two) times daily as needed for nausea or vomiting. 01/14/22  Yes [provider]  ondansetron (ZOFRAN-ODT) 4 MG disintegrating tablet Take 1 tablet (4 mg total) by mouth every 8 (eight) hours as needed for nausea or vomiting. 12/03/21  Yes Godfrey Pick, MD  oxyCODONE (OXY IR/ROXICODONE) 5 MG immediate release tablet Take 5 mg by mouth every 4 (four) hours as needed for moderate pain. 02/23/22  Yes [provider]  pantoprazole (PROTONIX) 40 MG tablet Take 1 tablet (40 mg total) by mouth 2 (two) times daily before a meal. Patient taking differently: Take 40 mg by mouth daily. 03/30/21  Yes Kathie Dike, MD  promethazine (PHENERGAN) 12.5 MG tablet Take 12.5 mg by mouth 3 (three) times daily as needed for nausea or vomiting. 02/26/22  Yes [provider]  rifaximin (XIFAXAN) 550 MG TABS tablet Take 550 mg by mouth 2 (two) times daily. 02/23/22 05/24/22 Yes [provider]  spironolactone (ALDACTONE) 100 MG tablet TAKE 1 TABLET BY MOUTH DAILY 01/02/22  Yes Annitta Needs, NP     This patient is critically ill with multiple organ system failure; which, requires frequent high complexity decision making, assessment, support, evaluation, and titration of therapies. This was completed through the application of advanced monitoring technologies and extensive interpretation of multiple databases. During this encounter critical care time was devoted to patient care services described in this note for 35 minutes.  Fredirick Maudlin Pulmonary/Critical Care  03/04/2022 9:47 AM

## 2022-03-05 ENCOUNTER — Inpatient Hospital Stay (HOSPITAL_COMMUNITY): Payer: Medicaid Other

## 2022-03-05 DIAGNOSIS — J9601 Acute respiratory failure with hypoxia: Secondary | ICD-10-CM | POA: Diagnosis not present

## 2022-03-05 DIAGNOSIS — K7290 Hepatic failure, unspecified without coma: Secondary | ICD-10-CM | POA: Diagnosis not present

## 2022-03-05 DIAGNOSIS — K7682 Hepatic encephalopathy: Secondary | ICD-10-CM | POA: Diagnosis not present

## 2022-03-05 DIAGNOSIS — N179 Acute kidney failure, unspecified: Secondary | ICD-10-CM | POA: Diagnosis not present

## 2022-03-05 DIAGNOSIS — K7460 Unspecified cirrhosis of liver: Secondary | ICD-10-CM

## 2022-03-05 LAB — CBC
HCT: 20 % — ABNORMAL LOW (ref 36.0–46.0)
HCT: 19.4 % — ABNORMAL LOW (ref 36.0–46.0)
Hemoglobin: 6.2 g/dL — CL (ref 12.0–15.0)
Hemoglobin: 6.3 g/dL — CL (ref 12.0–15.0)
MCH: 28.8 pg (ref 26.0–34.0)
MCH: 29.9 pg (ref 26.0–34.0)
MCHC: 31 g/dL (ref 30.0–36.0)
MCHC: 32.5 g/dL (ref 30.0–36.0)
MCV: 93 fL (ref 80.0–100.0)
MCV: 91.9 fL (ref 80.0–100.0)
Platelets: 108 10*3/uL — ABNORMAL LOW (ref 150–400)
Platelets: 105 10*3/uL — ABNORMAL LOW (ref 150–400)
RBC: 2.15 MIL/uL — ABNORMAL LOW (ref 3.87–5.11)
RBC: 2.11 MIL/uL — ABNORMAL LOW (ref 3.87–5.11)
RDW: 17.2 % — ABNORMAL HIGH (ref 11.5–15.5)
RDW: 17.1 % — ABNORMAL HIGH (ref 11.5–15.5)
WBC: 6.6 10*3/uL (ref 4.0–10.5)
WBC: 6.6 10*3/uL (ref 4.0–10.5)
nRBC: 0 % (ref 0.0–0.2)
nRBC: 0 % (ref 0.0–0.2)

## 2022-03-05 LAB — HEPATIC FUNCTION PANEL
ALT: 28 U/L (ref 0–44)
AST: 58 U/L — ABNORMAL HIGH (ref 15–41)
Albumin: 4.1 g/dL (ref 3.5–5.0)
Alkaline Phosphatase: 76 U/L (ref 38–126)
Bilirubin, Direct: 1 mg/dL — ABNORMAL HIGH (ref 0.0–0.2)
Indirect Bilirubin: 1.5 mg/dL — ABNORMAL HIGH (ref 0.3–0.9)
Total Bilirubin: 2.5 mg/dL — ABNORMAL HIGH (ref 0.3–1.2)
Total Protein: 5.9 g/dL — ABNORMAL LOW (ref 6.5–8.1)

## 2022-03-05 LAB — BODY FLUID CULTURE W GRAM STAIN: Culture: NO GROWTH

## 2022-03-05 LAB — PROTIME-INR
INR: 1.9 — ABNORMAL HIGH (ref 0.8–1.2)
Prothrombin Time: 21.7 seconds — ABNORMAL HIGH (ref 11.4–15.2)

## 2022-03-05 LAB — PREPARE RBC (CROSSMATCH)

## 2022-03-05 LAB — BASIC METABOLIC PANEL
Anion gap: 12 (ref 5–15)
BUN: 31 mg/dL — ABNORMAL HIGH (ref 6–20)
CO2: 18 mmol/L — ABNORMAL LOW (ref 22–32)
Calcium: 9.5 mg/dL (ref 8.9–10.3)
Chloride: 108 mmol/L (ref 98–111)
Creatinine, Ser: 0.9 mg/dL (ref 0.44–1.00)
GFR, Estimated: >60 mL/min (ref >60)
Glucose, Bld: 150 mg/dL — ABNORMAL HIGH (ref 70–99)
Potassium: 3.9 mmol/L (ref 3.5–5.1)
Sodium: 138 mmol/L (ref 135–145)

## 2022-03-05 LAB — MAGNESIUM: Magnesium: 2.5 mg/dL — ABNORMAL HIGH (ref 1.7–2.4)

## 2022-03-05 LAB — PATHOLOGIST SMEAR REVIEW

## 2022-03-05 LAB — TRIGLYCERIDES: Triglycerides: 65 mg/dL (ref <150)

## 2022-03-05 LAB — GLUCOSE, CAPILLARY
Glucose-Capillary: 132 mg/dL — ABNORMAL HIGH (ref 70–99)
Glucose-Capillary: 144 mg/dL — ABNORMAL HIGH (ref 70–99)
Glucose-Capillary: 124 mg/dL — ABNORMAL HIGH (ref 70–99)
Glucose-Capillary: 127 mg/dL — ABNORMAL HIGH (ref 70–99)
Glucose-Capillary: 126 mg/dL — ABNORMAL HIGH (ref 70–99)

## 2022-03-05 LAB — PHOSPHORUS: Phosphorus: 2.7 mg/dL (ref 2.5–4.6)

## 2022-03-05 LAB — AMMONIA: Ammonia: 162 umol/L — ABNORMAL HIGH (ref 9–35)

## 2022-03-05 MED ORDER — SODIUM CHLORIDE 0.9 % IV SOLN: 1.0000 g | INTRAVENOUS | Status: DC

## 2022-03-05 MED ORDER — SODIUM CHLORIDE 0.9% IV SOLUTION: Freq: Once | INTRAVENOUS | Status: AC

## 2022-03-05 MED ORDER — SODIUM CHLORIDE 0.9 % IV SOLN
1.0000 g | INTRAVENOUS | Status: AC
  Administered 2022-03-05 – 2022-03-07 (×3): 1000 mg via INTRAVENOUS
  Filled 2022-03-05 (×3): qty 1

## 2022-03-05 MED ORDER — DOCUSATE SODIUM 50 MG/5ML PO LIQD
100.0000 mg | Freq: Two times a day (BID) | ORAL | Status: DC
  Administered 2022-03-05 – 2022-03-13 (×14): 100 mg
  Filled 2022-03-05 (×15): qty 10

## 2022-03-05 MED ORDER — SODIUM CHLORIDE 0.9 % IV SOLN: 1.0000 g | Freq: Three times a day (TID) | INTRAVENOUS | Status: DC

## 2022-03-05 MED ORDER — POLYETHYLENE GLYCOL 3350 17 G PO PACK: 17.0000 g | PACK | Freq: Every day | ORAL | Status: DC

## 2022-03-05 MED ORDER — BLISTEX MEDICATED EX OINT
TOPICAL_OINTMENT | CUTANEOUS | Status: DC | PRN
  Filled 2022-03-05: qty 6.3

## 2022-03-05 MED ORDER — FENTANYL CITRATE PF 50 MCG/ML IJ SOSY
50.0000 ug | PREFILLED_SYRINGE | INTRAMUSCULAR | Status: DC | PRN
  Administered 2022-03-06 (×2): 50 ug via INTRAVENOUS
  Filled 2022-03-05 (×3): qty 1

## 2022-03-05 MED ORDER — ONDANSETRON HCL 4 MG/2ML IJ SOLN
4.0000 mg | Freq: Four times a day (QID) | INTRAMUSCULAR | Status: DC | PRN
  Administered 2022-03-05 – 2022-03-15 (×7): 4 mg via INTRAVENOUS
  Filled 2022-03-05 (×7): qty 2

## 2022-03-05 MED ORDER — PHENYLEPHRINE-MINERAL OIL-PET 0.25-14-74.9 % RE OINT
1.0000 | TOPICAL_OINTMENT | Freq: Two times a day (BID) | RECTAL | Status: DC | PRN
  Administered 2022-03-06 – 2022-03-15 (×2): 1 via RECTAL
  Filled 2022-03-05 (×2): qty 57

## 2022-03-05 MED ORDER — FENTANYL CITRATE PF 50 MCG/ML IJ SOSY
50.0000 ug | PREFILLED_SYRINGE | INTRAMUSCULAR | Status: DC | PRN
  Administered 2022-03-05 – 2022-03-06 (×2): 100 ug via INTRAVENOUS
  Administered 2022-03-06: 50 ug via INTRAVENOUS
  Administered 2022-03-06 – 2022-03-07 (×3): 100 ug via INTRAVENOUS
  Administered 2022-03-07: 150 ug via INTRAVENOUS
  Administered 2022-03-07: 100 ug via INTRAVENOUS
  Filled 2022-03-05: qty 2
  Filled 2022-03-05: qty 1
  Filled 2022-03-05 (×5): qty 2
  Filled 2022-03-05: qty 3
  Filled 2022-03-05: qty 4

## 2022-03-05 NOTE — Progress Notes (Signed)
eLink Physician-Brief Progress Note Patient Name: Erica Banks DOB: 26-Oct-1980 MRN: [id number]   Date of Service  03/05/2022  HPI/Events of Note  Patient c/o irritated hemorrhoids with scant bleeding and requests hemorrhoid cream.   eICU Interventions  Plan: Preparation H to rectum Q 12 hours PRN.      Intervention Category Major Interventions: Other:  Lysle Dingwall 03/05/2022, 11:26 PM

## 2022-03-05 NOTE — Progress Notes (Signed)
OG tube found halfway pulled out. Tube feeding stopped, OG reinserted, ordered KUB for placement verification. Dr Verlee Monte notified.

## 2022-03-05 NOTE — Progress Notes (Signed)
OG placement verified per KUB, placement at 56 cm, tube feeding restarted.

## 2022-03-05 NOTE — Progress Notes (Signed)
[facility] Progress Note   42 y.o. female history of PE, DVT, neuropathy, alcoholic cirrhosis with recurrent ascites, esophageal varices, hepatic encephalopathy d/c from [facility] recently with GI follow clinic where noted patient was confused. Patient transferred to [facility] as she looked very ill, was encephalopathic, not following commands or answering questions, intubated for airway protection. She is on daily ciprofloxacin for SBP prophylaxis started also on rocephin and rifaximin.  She is on Lasix 40mg  daiy at home.  Cr was 0.86 on 1/20. Patient now noted to have a BUN/Cr of 25/2.1 with a Na of 133. NH3 289 up from 27 on 1/19.    Assessment/ Plan:   Renal failure likely secondary to hepatorenal syndrome +/- contrast injury.  03/01/22 urine was bland.   Cr peaked at 2.1 on 2/3 and she never required dialytic support.  Currently remains on octreotide and midodrine but would favor stopping octreotide and weaning midodrine now that kidney function is normal and BPs have stabilized.  She did have serologies which showed low complements, though suspect this is secondary to cirrhosis not suggestive of underlying GN.   Nothing further to add at this time.  ETOH liver cirrhosis with hepatic encephalopathy on rifaximin R/o SBP + LVP 3L of clear/yellow fluid removed on 2/2 on Abx therapy as well. H/o PE Hypokalemia: repleted  Nothing further to add, will sign off.   Subjective:   No events overnight. UOP 1037mL yesterday up from 469mL day prior.  Cr 1.9 > 1.7 > 1.18mg /dL > 0.9, K 3.9   Objective:   BP 135/66   Pulse 92   Temp 97.9 F (36.6 C)   Resp 15   Ht 5\' 6"  (1.676 m)   Wt 68.4 kg   LMP  (LMP Unknown)   SpO2 100%   BMI 24.34 kg/m   Intake/Output Summary (Last 24 hours) at 03/05/2022 7262 Last data filed at 03/05/2022 0600 Gross per 24 hour  Intake 1259.67 ml  Output 1550 ml  Net -290.33 ml    Weight change: 1.2 kg  Physical Exam: General appearance: intubated Head:  NCAT Eyes: no scleral icterus noted Resp: mechanical BS Cardio: regular rate and rhythm GI: distended but not tense, no TTP Extremities: edema 1+ Pulses: 2+ and symmetric  Imaging: DG CHEST PORT 1 VIEW  Result Date: 03/04/2022 CLINICAL DATA:  Aspiration. EXAM: PORTABLE CHEST 1 VIEW COMPARISON:  March 01, 2022. FINDINGS: The heart size and mediastinal contours are within normal limits. Nasogastric tube is seen entering stomach. Endotracheal tube is noted with tip 8 cm above the carina. Both lungs are clear. The visualized skeletal structures are unremarkable. IMPRESSION: Endotracheal and nasogastric tubes are noted as described above. No acute cardiopulmonary abnormality seen. Electronically Signed   By: Marijo Conception M.D.   On: 03/04/2022 13:01    Labs: BMET Recent Labs  Lab 03/01/22 1050 03/01/22 2252 03/02/22 0600 03/02/22 1840 03/03/22 0549 03/03/22 0556 03/03/22 1632 03/04/22 0050 03/04/22 1658  NA 130*  --  133*  --  134*  --  135 135 135  K 4.1  --  4.0  --  4.3  --  3.9 3.4* 3.8  CL 102  --  105  --  105  --  106 107 108  CO2 14*  --  17*  --  19*  --  19* 19* 17*  GLUCOSE 146*  --  94  --  127*  --  136* 133* 123*  BUN 23*  --  25*  --  29*  --  29* 31* 31*  CREATININE 1.55* 1.88* 2.10*  --  2.11*  --  1.86* 1.74* 1.18*  CALCIUM 8.1*  --  9.2  --  9.0  --  8.9 9.0 9.3  PHOS  --   --  6.2* 6.3*  --  7.5* 6.8* 5.7* 3.4    CBC Recent Labs  Lab 03/01/22 1050 03/01/22 2252 03/02/22 0600  WBC 13.6* 19.7* 15.1*  HGB 9.2* 8.8* 8.0*  HCT 28.4* 27.6* 24.5*  MCV 90.7 91.7 89.7  PLT 186 192 158     Medications:     Chlorhexidine Gluconate Cloth  6 each Topical Q0600   feeding supplement (PROSource TF20)  60 mL Per Tube Daily   folic acid  1 mg Per Tube Daily   free water  100 mL Per Tube Q6H   heparin  5,000 Units Subcutaneous Q8H   lactulose  20 g Per Tube TID   midodrine  5 mg Per Tube TID WC   multivitamin with minerals  1 tablet Per Tube Daily   mouth  rinse  15 mL Mouth Rinse Q2H   pantoprazole (PROTONIX) IV  40 mg Intravenous Q24H   polyethylene glycol  17 g Per Tube QID   rifaximin  550 mg Per Tube BID   thiamine (VITAMIN B1) injection  100 mg Intravenous Daily   zinc sulfate  220 mg Per Tube Daily   Jannifer Hick MD [facility] Pager [phone]

## 2022-03-05 NOTE — Progress Notes (Signed)
Vomitting large amount of yellow emesis. Tube feeding held, assisted patient with oral suction, HOB at 90 degrees. Dr Verlee Monte notified, new orders obtained and Zofran administered.

## 2022-03-05 NOTE — Progress Notes (Signed)
[facility] ROUNDING NOTE   Subjective: Intubated and sedated.  No acute events overnight per discussion with nursing staff.  Objective: Vital signs in last 24 hours: Temp:  [97.5 F (36.4 C)-100 F (37.8 C)] 97.9 F (36.6 C) (02/06 0801) Pulse Rate:  [88-103] 92 (02/06 0615) Resp:  [10-48] 15 (02/06 0615) BP: (115-151)/(52-86) 135/66 (02/06 0615) SpO2:  [99 %-100 %] 100 % (02/06 0815) FiO2 (%):  [30 %] 30 % (02/06 0815) Weight:  [68.4 kg] 68.4 kg (02/06 0139) Last BM Date : 03/04/22 General: NAD Abdomen:  Soft, NT   Intake/Output from previous day: 02/05 0701 - 02/06 0700 In: 1259.7 [I.V.:710.3; NG/GT:247.3; IV Piggyback:302] Out: 1550 [Urine:1000; Stool:550] Intake/Output this shift: No intake/output data recorded.   Lab Results: Recent Labs    03/05/22 0654  WBC 6.6  HGB 6.2*  PLT 108*  MCV 93.0   BMET Recent Labs    03/04/22 0050 03/04/22 1658 03/05/22 0654  NA 135 135 138  K 3.4* 3.8 3.9  CL 107 108 108  CO2 19* 17* 18*  GLUCOSE 133* 123* 150*  BUN 31* 31* 31*  CREATININE 1.74* 1.18* 0.90  CALCIUM 9.0 9.3 9.5   LFT Recent Labs    03/04/22 0050  PROT 6.1*  ALBUMIN 4.4  AST 73*  ALT 32  ALKPHOS 78  BILITOT 1.6*  BILIDIR 0.8*  IBILI 0.8   PT/INR Recent Labs    03/04/22 0050 03/05/22 0654  INR 2.1* 1.9*      Imaging/Other results: DG CHEST PORT 1 VIEW  Result Date: 03/04/2022 CLINICAL DATA:  Aspiration. EXAM: PORTABLE CHEST 1 VIEW COMPARISON:  March 01, 2022. FINDINGS: The heart size and mediastinal contours are within normal limits. Nasogastric tube is seen entering stomach. Endotracheal tube is noted with tip 8 cm above the carina. Both lungs are clear. The visualized skeletal structures are unremarkable. IMPRESSION: Endotracheal and nasogastric tubes are noted as described above. No acute cardiopulmonary abnormality seen. Electronically Signed   By: Marijo Conception M.D.   On: 03/04/2022 13:01      Assessment and  Plan:  42 year old female history of decompensated alcoholic cirrhosis with ascites, hepatic encephalopathy, esophageal varices and history of previous SBP, presented 03/01/2022 w/ severe hepatic encephalopathy, AKI  1) Decompensated EtOH cirrhosis MELD 3.0: 15 at 03/05/2022  6:54 AM (24 yesterday) MELD-Na: 15 at 03/05/2022  6:54 AM Calculated from: Serum Creatinine: 0.90 mg/dL (Using min of 1 mg/dL) at 03/05/2022  6:54 AM Serum Sodium: 138 mmol/L (Using max of 137 mmol/L) at 03/05/2022  6:54 AM Total Bilirubin: 1.6 mg/dL at 03/04/2022 12:50 AM Serum Albumin: 4.4 g/dL (Using max of 3.5 g/dL) at 03/04/2022 12:50 AM INR(ratio): 1.9 at 03/05/2022  6:54 AM Age at listing (hypothetical): 41 years Sex: Female at 03/05/2022  6:54 AM  -Check liver enzymes - INR slightly improved at 1.9 - Renal function improving - Continue rifaximin, lactulose - Continue using - Sedation holidays per [facility] - Continue folic acid, MVI, thiamine  2) AKI/HRS - Renal function improving today with creatinine 0.9 - Nephrology service following - Avoid large volume paracentesis.  Limit to 3 L if/when needed - Diuretics still on hold - Continue octreotide and midodrine  3) Ascites - History of SBP.  No SBP on most recent LVP - Holding diuretics due to AKI/HRS as above.  Continue monitoring - As above, avoid LVP given renal function  4) Esophageal varices - EGD 12/22/2021 by Dr. Jenetta Downer with grade 1-2 varices - No overt bleeding but  H/H declined today.  However, all cell lines were down, suspicious for dilution artifact - Repeat CBC now.  If truly anemic, agree with plan for RBC transfusion and monitor for overt bleeding - Already on Rocephin  5) Encephalopathy 6) Acute hypoxic respiratory failure 7) Klebsiella UTI Encephalopathy multifactorial related to underlying HE along with Klebsiella UTI - Vent management per [facility] - Continue ABX per [facility] - Continue lactulose, rifaximin  She remains critically ill with overall poor  prognosis    [place], DO  03/05/2022, 9:06 AM [facility] Pager [phone]

## 2022-03-05 NOTE — Progress Notes (Signed)
NAME:  Erica Banks, MRN:  [id number], DOB:  [birth year], LOS: 4 ADMISSION DATE:  03/01/2022, CONSULTATION DATE:  2/2 REFERRING MD:  [facility] -ED / Dr Sharlett Iles, CHIEF COMPLAINT:  ams   History of Present Illness:  83 yowf with Etoh Cirrhosis /ascites and h/o hep encephalopathy d/c from [facility] 1/29  to GI clinic 2/2 am  to schedule paracentesis but woke up confused > referred from GI to ER lethargic, risk of aspiration and need for rx with lactulose so electively intubated by EDP and request for consultation/ transfer to [facility] if bed available   Pertinent  Medical History  42 y.o. 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  Also with history of IDA following with hematology with prescription iron infusions, GERD, PE, DVT, neuropathy.   Past Medical History:  Diagnosis Date   Anemia    Cirrhosis ([place])    DVT (deep venous thrombosis) (HCC)    Low iron    Neuropathy    Neuropathy    Panic attacks    Pulmonary embolus ([place]) 2019     Significant Hospital Events: Including procedures, antibiotic start and stop dates in addition to other pertinent events   Oral ET  2/2 >>> UDS  2/2   pos benzo's and THC  Scheduled Meds:  Chlorhexidine Gluconate Cloth  6 each Topical Q0600   feeding supplement (PROSource TF20)  60 mL Per Tube Daily   folic acid  1 mg Per Tube Daily   free water  100 mL Per Tube Q6H   heparin  5,000 Units Subcutaneous Q8H   lactulose  20 g Per Tube TID   midodrine  5 mg Per Tube TID WC   multivitamin with minerals  1 tablet Per Tube Daily   mouth rinse  15 mL Mouth Rinse Q2H   pantoprazole (PROTONIX) IV  40 mg Intravenous Q24H   polyethylene glycol  17 g Per Tube QID   rifaximin  550 mg Per Tube BID   thiamine (VITAMIN B1) injection  100 mg Intravenous Daily   zinc sulfate  220 mg Per Tube Daily   Continuous Infusions:  sodium chloride Stopped (03/04/22 1519)    ertapenem     feeding supplement (VITAL 1.5 CAL) 30 mL/hr at 03/05/22 1243   fentaNYL infusion INTRAVENOUS Stopped (03/05/22 1116)   octreotide (SANDOSTATIN) 500 mcg in sodium chloride 0.9 % 250 mL (2 mcg/mL) infusion 50 mcg/hr (03/05/22 1243)   propofol (DIPRIVAN) infusion Stopped (03/04/22 1034)   PRN Meds:.sodium chloride, fentaNYL, mouth rinse   Interim History / Subjective:   Klebsiella UCx susceptibility info back, ESBL.  Intermittent issues with vent synchrony overnight, lots of double triggering, restarted on fentanyl gtt.   Objective   Blood pressure 134/61, pulse 91, temperature 97.9 F (36.6 C), resp. rate (!) 21, height 5\' 6"  (1.676 m), weight 68.4 kg, SpO2 100 %.    Vent Mode: PRVC FiO2 (%):  [30 %] 30 % Set Rate:  [16 bmp-20 bmp] 16 bmp Vt Set:  [400 mL-470 mL] 470 mL PEEP:  [5 cmH20-8 cmH20] 8 cmH20 Plateau Pressure:  [11 cmH20-16 cmH20] 16 cmH20   Intake/Output Summary (Last 24 hours) at 03/05/2022 1248 Last data filed at 03/05/2022 1243 Gross per 24 hour  Intake 1415.75 ml  Output 1550 ml  Net -134.25 ml   Filed Weights   03/03/22 0500 03/04/22 0444 03/05/22 0139  Weight: 70.4 kg 67.2 kg 68.4 kg  Examination: General: chronically ill-appearing HEENT: ETT in place Extremities: Thin, muscle wasting present Abdomen: distended, ascites present Heart: Regular rate rhythm, S1-S2 Lungs: Bilateral mechanically ventilated breath sounds, equal chest rise Neuro: rass -4 off sedation for 2 hours    Assessment & Plan:   Acute hypoxemic respiratory failure requiring intubation mechanical ventilation Plan: Full vent support Wean PEEP and FiO2 to maintain sats above 90% Failed SBT today - apneic  Severe Sepsis due to ESBL Klebsiella UTI with AKI Stop ceftriaxone Start ertapenem  Acute metabolic toxic encephalopathy secondary to hepatic encephalopathy and hyperammonemia Acute on chronic decompensated liver cirrhosis secondary to alcohol use Restrictive  respiratory physiology secondary to severe abdominal distention and alcoholic ascites status post large-volume paracentesis Plan: Recheck ammonia If remains unresponsive after holding sedation later this afternoon, consider EEG Continue rifaximin Continue lactulose target 1L stool output Continue vitamin supplements, thiamine multivitamin and zinc  Acute renal failure, at risk for hepatorenal syndrome, improving Hyponatremia Hypoalbuminemia Plan: Continue midodrine plus octreotide, likely stop tomorrow Nephro following  GoC: Per sister her last drink was 1 year ago, per husband some MJ use recently however.    Labs   CBC: Recent Labs  Lab 03/01/22 1050 03/01/22 2252 03/02/22 0600 03/05/22 0654 03/05/22 0930  WBC 13.6* 19.7* 15.1* 6.6 6.6  HGB 9.2* 8.8* 8.0* 6.2* 6.3*  HCT 28.4* 27.6* 24.5* 20.0* 19.4*  MCV 90.7 91.7 89.7 93.0 91.9  PLT 186 192 158 108* 105*    Basic Metabolic Panel: Recent Labs  Lab 03/03/22 0549 03/03/22 0556 03/03/22 1632 03/04/22 0050 03/04/22 1658 03/05/22 0654  NA 134*  --  135 135 135 138  K 4.3  --  3.9 3.4* 3.8 3.9  CL 105  --  106 107 108 108  CO2 19*  --  19* 19* 17* 18*  GLUCOSE 127*  --  136* 133* 123* 150*  BUN 29*  --  29* 31* 31* 31*  CREATININE 2.11*  --  1.86* 1.74* 1.18* 0.90  CALCIUM 9.0  --  8.9 9.0 9.3 9.5  MG  --  2.4 2.4 2.4 2.4 2.5*  PHOS  --  7.5* 6.8* 5.7* 3.4 2.7   GFR: Estimated Creatinine Clearance: 77.8 mL/min (by C-G formula based on SCr of 0.9 mg/dL). Recent Labs  Lab 03/01/22 2252 03/02/22 0600 03/05/22 0654 03/05/22 0930  WBC 19.7* 15.1* 6.6 6.6    Liver Function Tests: Recent Labs  Lab 03/01/22 1050 03/02/22 0600 03/04/22 0050 03/05/22 0930  AST 115* 95* 73* 58*  ALT 51* 48* 32 28  ALKPHOS 131* 103 78 76  BILITOT 2.5* 2.3* 1.6* 2.5*  PROT 5.8* 5.7* 6.1* 5.9*  ALBUMIN 2.9* 3.2* 4.4 4.1   No results for input(s): "LIPASE", "AMYLASE" in the last 168 hours. Recent Labs  Lab 03/01/22 1050  03/02/22 1041 03/04/22 0050  AMMONIA 289* 133* 133*    ABG    Component Value Date/Time   PHART 7.38 03/01/2022 1656   PCO2ART 30 (L) 03/01/2022 1656   PO2ART 160 (H) 03/01/2022 1656   HCO3 18.1 (L) 03/01/2022 1656   ACIDBASEDEF 6.6 (H) 03/01/2022 1656   O2SAT 100 03/01/2022 1656     Coagulation Profile: Recent Labs  Lab 03/02/22 1041 03/02/22 1248 03/03/22 0556 03/04/22 0050 03/05/22 0654  INR 1.8* 1.8* 1.9* 2.1* 1.9*    Cardiac Enzymes: No results for input(s): "CKTOTAL", "CKMB", "CKMBINDEX", "TROPONINI" in the last 168 hours.  HbA1C: No results found for: "HGBA1C"  CBG: Recent Labs  Lab 03/04/22 1931 03/04/22  2335 03/05/22 0323 03/05/22 0733 03/05/22 1058  GLUCAP 118* 152* 132* 144* 124*     Past Medical History:  She,  has a past medical history of Anemia, Cirrhosis (HCC), DVT (deep venous thrombosis) (HCC), Low iron, Neuropathy, Neuropathy, Panic attacks, and Pulmonary embolus (HCC) (2019).   Surgical History:   Past Surgical History:  Procedure Laterality Date   ABDOMINAL HYSTERECTOMY     BIOPSY N/A 06/24/2013   Procedure: BIOPSY TERMINAL ILEUM;  Surgeon: Corbin Ade, MD;  Location: [facility];  Service: Endoscopy;  Laterality: N/A;   CHOLECYSTECTOMY     COLONOSCOPY N/A 06/24/2013   [id number] hemorrhoids/otherwise normal    COLONOSCOPY WITH PROPOFOL N/A 05/31/2021   Surgeon: Corbin Ade, MD; ery large nonbleeding internal and external hemorrhoids, portal colopathy.  Recommended repeat colonoscopy in 10 years.   ESOPHAGOGASTRODUODENOSCOPY (EGD) WITH PROPOFOL N/A 03/29/2021   Surgeon: Lanelle Bal, DO;  grade 1 esophageal varices, portal hypertensive gastropathy with friable tissue with spontaneous ooze, normal duodenum.   ESOPHAGOGASTRODUODENOSCOPY (EGD) WITH PROPOFOL N/A 05/31/2021   Surgeon: Corbin Ade, MD;  grade 1-2 esophageal varices without bleeding stigmata, advanced appearing portal hypertensive gastropathy.    ESOPHAGOGASTRODUODENOSCOPY (EGD) WITH PROPOFOL N/A 12/22/2021   Procedure: ESOPHAGOGASTRODUODENOSCOPY (EGD) WITH PROPOFOL;  Surgeon: Dolores Frame, MD;  Location: [facility];  Service: Gastroenterology;  Laterality: N/A;   nasal bone surgery     NASAL SINUS SURGERY     SINUS IRRIGATION     TONSILLECTOMY     TUBAL LIGATION       Social History:   reports that she has been smoking cigarettes. She has a 6.00 pack-year smoking history. She has never used smokeless tobacco. She reports that she does not currently use alcohol after a past usage of about 4.0 - 5.0 standard drinks of alcohol per week. She reports current drug use. Drug: Marijuana.   Family History:  Her family history includes COPD in her mother; Cancer in an other family member; Cirrhosis in her father; Crohn's disease in her maternal grandmother; Diabetes in her mother and sister; Hypertension in her mother and sister. There is no history of Colon cancer or Autoimmune disease.   Allergies Allergies  Allergen Reactions   Demerol Anaphylaxis    Per patient cardiac arrest     Home Medications  Prior to Admission medications   Medication Sig Start Date End Date Taking? Authorizing Provider  CONSTULOSE 10 GM/15ML solution Take 30 mLs (20 g total) by mouth 3 (three) times daily. 12/22/21  Yes Erick Blinks, MD  furosemide (LASIX) 40 MG tablet TAKE 1 TABLET BY MOUTH DAILY 01/02/22  Yes Gelene Mink, NP  gabapentin (NEURONTIN) 300 MG capsule Take 300 mg by mouth 3 (three) times daily as needed (nerve pain). 02/25/21  Yes [provider]  hydrOXYzine (VISTARIL) 25 MG capsule Take 25 mg by mouth 3 (three) times daily as needed for anxiety. 11/13/21  Yes [provider]  meclizine (ANTIVERT) 12.5 MG tablet Take 12.5 mg by mouth 3 (three) times daily as needed for dizziness or nausea. 02/23/22 03/25/22 Yes [provider]  methocarbamol (ROBAXIN) 500 MG tablet Take 500 mg by mouth at bedtime as  needed for muscle spasms. 11/29/21  Yes [provider]  metoCLOPramide (REGLAN) 5 MG tablet Take 5 mg by mouth 2 (two) times daily as needed for nausea or vomiting. 01/14/22  Yes [provider]  ondansetron (ZOFRAN-ODT) 4 MG disintegrating tablet Take 1 tablet (4 mg total) by  mouth every 8 (eight) hours as needed for nausea or vomiting. 12/03/21  Yes Godfrey Pick, MD  oxyCODONE (OXY IR/ROXICODONE) 5 MG immediate release tablet Take 5 mg by mouth every 4 (four) hours as needed for moderate pain. 02/23/22  Yes [provider]  pantoprazole (PROTONIX) 40 MG tablet Take 1 tablet (40 mg total) by mouth 2 (two) times daily before a meal. Patient taking differently: Take 40 mg by mouth daily. 03/30/21  Yes Kathie Dike, MD  promethazine (PHENERGAN) 12.5 MG tablet Take 12.5 mg by mouth 3 (three) times daily as needed for nausea or vomiting. 02/26/22  Yes [provider]  rifaximin (XIFAXAN) 550 MG TABS tablet Take 550 mg by mouth 2 (two) times daily. 02/23/22 05/24/22 Yes [provider]  spironolactone (ALDACTONE) 100 MG tablet TAKE 1 TABLET BY MOUTH DAILY 01/02/22  Yes Annitta Needs, NP     This patient is critically ill with multiple organ system failure; which, requires frequent high complexity decision making, assessment, support, evaluation, and titration of therapies. This was completed through the application of advanced monitoring technologies and extensive interpretation of multiple databases. During this encounter critical care time was devoted to patient care services described in this note for 38 minutes.  Fredirick Maudlin [facility]  03/05/2022 12:48 PM

## 2022-03-06 DIAGNOSIS — J9601 Acute respiratory failure with hypoxia: Secondary | ICD-10-CM | POA: Diagnosis not present

## 2022-03-06 DIAGNOSIS — K7460 Unspecified cirrhosis of liver: Secondary | ICD-10-CM | POA: Diagnosis not present

## 2022-03-06 DIAGNOSIS — K7290 Hepatic failure, unspecified without coma: Secondary | ICD-10-CM | POA: Diagnosis not present

## 2022-03-06 DIAGNOSIS — K7682 Hepatic encephalopathy: Secondary | ICD-10-CM | POA: Diagnosis not present

## 2022-03-06 LAB — GLUCOSE, CAPILLARY
Glucose-Capillary: 140 mg/dL — ABNORMAL HIGH (ref 70–99)
Glucose-Capillary: 124 mg/dL — ABNORMAL HIGH (ref 70–99)
Glucose-Capillary: 118 mg/dL — ABNORMAL HIGH (ref 70–99)
Glucose-Capillary: 118 mg/dL — ABNORMAL HIGH (ref 70–99)
Glucose-Capillary: 119 mg/dL — ABNORMAL HIGH (ref 70–99)
Glucose-Capillary: 119 mg/dL — ABNORMAL HIGH (ref 70–99)

## 2022-03-06 LAB — BPAM RBC
Blood Product Expiration Date: 202403082359
ISSUE DATE / TIME: 202402061331
Unit Type and Rh: 5100

## 2022-03-06 LAB — PROTIME-INR
INR: 1.8 — ABNORMAL HIGH (ref 0.8–1.2)
Prothrombin Time: 20.4 seconds — ABNORMAL HIGH (ref 11.4–15.2)

## 2022-03-06 LAB — CBC
HCT: 23 % — ABNORMAL LOW (ref 36.0–46.0)
Hemoglobin: 7.5 g/dL — ABNORMAL LOW (ref 12.0–15.0)
MCH: 29.6 pg (ref 26.0–34.0)
MCHC: 32.6 g/dL (ref 30.0–36.0)
MCV: 90.9 fL (ref 80.0–100.0)
Platelets: 106 10*3/uL — ABNORMAL LOW (ref 150–400)
RBC: 2.53 MIL/uL — ABNORMAL LOW (ref 3.87–5.11)
RDW: 17.2 % — ABNORMAL HIGH (ref 11.5–15.5)
WBC: 6.8 10*3/uL (ref 4.0–10.5)
nRBC: 0 % (ref 0.0–0.2)

## 2022-03-06 LAB — BASIC METABOLIC PANEL
Anion gap: 11 (ref 5–15)
BUN: 28 mg/dL — ABNORMAL HIGH (ref 6–20)
CO2: 22 mmol/L (ref 22–32)
Calcium: 9.6 mg/dL (ref 8.9–10.3)
Chloride: 109 mmol/L (ref 98–111)
Creatinine, Ser: 0.74 mg/dL (ref 0.44–1.00)
GFR, Estimated: >60 mL/min (ref >60)
Glucose, Bld: 148 mg/dL — ABNORMAL HIGH (ref 70–99)
Potassium: 3.8 mmol/L (ref 3.5–5.1)
Sodium: 142 mmol/L (ref 135–145)

## 2022-03-06 LAB — TYPE AND SCREEN
ABO/RH(D): O POS
Antibody Screen: NEGATIVE
Unit division: 0

## 2022-03-06 MED ORDER — SPIRONOLACTONE 25 MG PO TABS
50.0000 mg | ORAL_TABLET | Freq: Every day | ORAL | Status: DC
  Administered 2022-03-06 – 2022-03-13 (×8): 50 mg
  Filled 2022-03-06 (×9): qty 2

## 2022-03-06 MED ORDER — POLYVINYL ALCOHOL 1.4 % OP SOLN: 1.0000 [drp] | OPHTHALMIC | Status: DC | PRN

## 2022-03-06 MED ORDER — FUROSEMIDE 40 MG PO TABS
20.0000 mg | ORAL_TABLET | Freq: Every day | ORAL | Status: DC
  Administered 2022-03-06 – 2022-03-07 (×2): 20 mg via ORAL
  Filled 2022-03-06 (×2): qty 1

## 2022-03-06 NOTE — Progress Notes (Addendum)
Attending physician's note   I have taken a history, reviewed the chart, and examined the patient. I performed a substantive portion of this encounter, including complete performance of at least one of the key components, in conjunction with the APP. I agree with the APP's note, impression, and recommendations with my edits.   Culture grew out ESBL.  Changed to ertapenem.  Ammonia 162 yesterday.  Transfuse 1 unit PRBCs overnight with good serologic response, hemoglobin 7.5 today.  No overt bleeding. - Continue lactulose/rifaximin - Anemia without overt bleeding, but also in the setting of thrombocytopenia.  Defer additional hematologic evaluation for alternate etiology to primary CCS - Agree with adding zinc - Renal function has essentially normalized.  Octreotide stopped and per Nephrology midodrine can be weaned - Restarting low-dose diuretics - Would still keep any paracentesis to low volume (3 L max) for the time being - Agree with trialing trickle feeds - GI service will continue to follow intermittently  Khaleelah Yowell, DO, FACG [phone] office          Progress Note  Primary GI: Dr. Gala Romney    Subjective  Chief Complaint:Decompensated EtOH induced cirrhosis with severe hepatic encephalopathy requiring vent support   Patient with family at bedside, sister Clyde Canterbury. Provided some of the history.  Patient on ventilation, off sedation.  Having yellow/brown liqiud stool output.  Nurse had some concern that tube feeding color and consistency was in the ET tube.  Tube feedings were stopped due to concern for aspiration. Patient is opening her eyes, still not following commands.     Objective   Vital signs in last 24 hours: Temp:  [97.9 F (36.6 C)-100.4 F (38 C)] 100.4 F (38 C) (02/07 1200) Pulse Rate:  [84-114] 101 (02/07 1200) Resp:  [10-30] 19 (02/07 1200) BP: (67-152)/(20-85) 152/79 (02/07 1200) SpO2:  [100 %] 100 % (02/07 1200) FiO2 (%):  [30 %] 30 %  (02/07 1155) Weight:  [66.5 kg] 66.5 kg (02/07 0333) Last BM Date : 03/05/22 Last BM recorded by nurses in past 5 days Stool Type: Type 7 (Liquid consistency with no solid pieces) (03/06/2022  8:00 AM)  General: Chronically ill-appearing female, on vent Heart:  Regular rate and rhythm; no murmurs Pulm: Clear anteriorly; no wheezing, intubated Abdomen:  Distended, soft abdomen with ascites large volume, Active bowel sounds.  Extremities:  with 1-2+ edema. Neurologic: Opening her eyes, more alert, not responsive to commands.   Intake/Output from previous day: 02/06 0701 - 02/07 0700 In: 2110.1 [I.V.:650.8; Blood:418; NG/GT:841.3; IV [id number]:433] Out: 2311 [Urine:1410; Stool:901] Intake/Output this shift: Total I/O In: 105.8 [I.V.:105.8] Out: 100 [Urine:100]  Studies/Results: DG Abd 1 View  Result Date: 03/05/2022 CLINICAL DATA:  Nasogastric tube placement. EXAM: ABDOMEN - 1 VIEW COMPARISON:  March 01, 2022 FINDINGS: Enteric catheter overlies the gastric bubble with tip at the lateral gastric body. Side hole is below the GE junction. Nonspecific bowel gas pattern. IMPRESSION: Enteric catheter with tip at the lateral gastric body. Electronically Signed   By: Fidela Salisbury M.D.   On: 03/05/2022 17:35    Lab Results: Recent Labs    03/05/22 0654 03/05/22 0930 03/06/22 0213  WBC 6.6 6.6 6.8  HGB 6.2* 6.3* 7.5*  HCT 20.0* 19.4* 23.0*  PLT 108* 105* 106*   BMET Recent Labs    03/04/22 1658 03/05/22 0654 03/06/22 0213  NA 135 138 142  K 3.8 3.9 3.8  CL 108 108 109  CO2 17* 18* 22  GLUCOSE 123*  150* 148*  BUN 31* 31* 28*  CREATININE 1.18* 0.90 0.74  CALCIUM 9.3 9.5 9.6   LFT Recent Labs    03/05/22 0930  PROT 5.9*  ALBUMIN 4.1  AST 58*  ALT 28  ALKPHOS 76  BILITOT 2.5*  BILIDIR 1.0*  IBILI 1.5*   PT/INR Recent Labs    03/05/22 0654 03/06/22 0213  LABPROT 21.7* 20.4*  INR 1.9* 1.8*     Scheduled Meds:  Chlorhexidine Gluconate Cloth  6 each  Topical Q0600   docusate  100 mg Per Tube BID   feeding supplement (PROSource TF20)  60 mL Per Tube Daily   folic acid  1 mg Per Tube Daily   free water  100 mL Per Tube Q6H   furosemide  20 mg Oral Daily   heparin  5,000 Units Subcutaneous Q8H   lactulose  20 g Per Tube TID   midodrine  5 mg Per Tube TID WC   multivitamin with minerals  1 tablet Per Tube Daily   mouth rinse  15 mL Mouth Rinse Q2H   pantoprazole (PROTONIX) IV  40 mg Intravenous Q24H   polyethylene glycol  17 g Per Tube QID   rifaximin  550 mg Per Tube BID   spironolactone  50 mg Per Tube Daily   thiamine (VITAMIN B1) injection  100 mg Intravenous Daily   zinc sulfate  220 mg Per Tube Daily   Continuous Infusions:  sodium chloride Stopped (03/04/22 1519)   ertapenem Stopped (03/05/22 1514)   feeding supplement (VITAL 1.5 CAL) Stopped (03/05/22 1830)      Patient profile:   42 year old female history of decompensated alcoholic cirrhosis with ascites, hepatic encephalopathy, esophageal varices and history of previous SBP, presented 03/01/2018 for severe hepatic encephalopathy,    Impression/Plan:   Decompensated cirrhosis secondary to ETOH MELD-Na: 16 (15) (24) -Daily CBC, CMET, INR -Empiric treatment with folic acid, multivitamin and thiamine.  --No role for liver transplantation given ongoing drug use - Overall guarded prognosis, continue goals of care discussion with the family.  Severe Hepatic Encephalopathy, still intubated but weaned off at this time Possible trigger UTI Klebsiella  03/05/2022 Ammonia 162 (133)( 133) ( 289) With addition of lactulose enemas, has had 1000 cc BM over night .  - Lactulose 56ml q 4 hours per day per Tube - Rifaximin 550mg  bid per tube - add on zinc tube -  appreciate CCM help   Acute renal failure likely hepatorenal syndrome Improved, off octreotride and midodine -Avoid large volume paracentesis, limit to 3 L when needed -first dose lasix 20 mg and spironolactone 50 mg  today, - monitor kidney function closely  Ascites Recent paracentesis SBP negative 3 L paracentesis 02/05, can repeat as needed Lasix 20 mg and spironolactone 25 mg added back today, increase as kidney function allows  Some concern for tube feedings and ET tube, on hold at this time Consider reglan 5 mg TID for 1 day Continue meds per tube feeding Can try to add back tube feedings slowly  History of esophageal varices 12/22/2021 EGD with Dr.Castaneda grade 1-2 varices  Anemia chronic Recent Labs    02/14/22 0415 02/15/22 0342 02/15/22 0832 02/15/22 1659 03/01/22 1050 03/01/22 2252 03/02/22 0600 03/05/22 0654 03/05/22 0930 03/06/22 0213  HGB 7.2* 7.1* 7.8* 7.6* 9.2* 8.8* 8.0* 6.2* 6.3* 7.5*  No overt GI bleeding, monitor closely, slowly down trending  S/p 1 PRBC 03/05/22, with 6.2 to now 7.5 On rocehpin Continue to monitor H/H and transfuse as needed  Principal Problem:   Hepatic encephalopathy ([place]) Active Problems:   Acute hepatic encephalopathy (HCC)   Acute respiratory failure with hypoxemia (HCC)   Decompensated hepatic cirrhosis ([place])    LOS: 5 days   Vladimir Crofts  03/06/2022, 12:39 PM

## 2022-03-06 NOTE — Progress Notes (Signed)
NAME:  Erica Banks, MRN:  [id number], DOB:  1980-02-17, LOS: 5 ADMISSION DATE:  03/01/2022, CONSULTATION DATE:  2/2 REFERRING MD:  [facility] -ED / Dr Sharlett Iles, CHIEF COMPLAINT:  ams   History of Present Illness:  89 yowf with Etoh Cirrhosis /ascites and h/o hep encephalopathy d/c from [facility] 1/29  to GI clinic 2/2 am  to schedule paracentesis but woke up confused > referred from GI to ER lethargic, risk of aspiration and need for rx with lactulose so electively intubated by EDP and request for consultation/ transfer to [facility] if bed available   Pertinent  Medical History  42 y.o. 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  Also with history of IDA following with hematology with prescription iron infusions, GERD, PE, DVT, neuropathy.   Past Medical History:  Diagnosis Date   Anemia    Cirrhosis ([place])    DVT (deep venous thrombosis) (HCC)    Low iron    Neuropathy    Neuropathy    Panic attacks    Pulmonary embolus ([place]) 2019     Significant Hospital Events: Including procedures, antibiotic start and stop dates in addition to other pertinent events   Oral ET  2/2 >>> UDS  2/2   pos benzo's and THC  Scheduled Meds:  Chlorhexidine Gluconate Cloth  6 each Topical Q0600   docusate  100 mg Per Tube BID   feeding supplement (PROSource TF20)  60 mL Per Tube Daily   folic acid  1 mg Per Tube Daily   free water  100 mL Per Tube Q6H   furosemide  20 mg Oral Daily   heparin  5,000 Units Subcutaneous Q8H   lactulose  20 g Per Tube TID   midodrine  5 mg Per Tube TID WC   multivitamin with minerals  1 tablet Per Tube Daily   mouth rinse  15 mL Mouth Rinse Q2H   pantoprazole (PROTONIX) IV  40 mg Intravenous Q24H   polyethylene glycol  17 g Per Tube QID   rifaximin  550 mg Per Tube BID   spironolactone  50 mg Per Tube Daily   thiamine (VITAMIN B1) injection  100 mg Intravenous Daily    zinc sulfate  220 mg Per Tube Daily   Continuous Infusions:  sodium chloride Stopped (03/04/22 1519)   ertapenem Stopped (03/05/22 1514)   feeding supplement (VITAL 1.5 CAL) Stopped (03/05/22 1830)   PRN Meds:.sodium chloride, fentaNYL (SUBLIMAZE) injection, fentaNYL (SUBLIMAZE) injection, lip balm, ondansetron (ZOFRAN) IV, mouth rinse, phenylephrine-shark liver oil-mineral oil-petrolatum   Interim History / Subjective:   More alert this morning able to stick tongue out with a lot of encouragement  Objective   Blood pressure 119/67, pulse 95, temperature 99.7 F (37.6 C), resp. rate 19, height 5\' 6"  (1.676 m), weight 66.5 kg, SpO2 100 %.    Vent Mode: PCV FiO2 (%):  [30 %] 30 % Set Rate:  [14 bmp-16 bmp] 14 bmp Vt Set:  [470 mL] 470 mL PEEP:  [5 cmH20-8 cmH20] 5 cmH20 Plateau Pressure:  [15 cmH20-19 cmH20] 19 cmH20   Intake/Output Summary (Last 24 hours) at 03/06/2022 1057 Last data filed at 03/06/2022 0600 Gross per 24 hour  Intake 2110.1 ml  Output 2311 ml  Net -200.9 ml   Filed Weights   03/04/22 0444 03/05/22 0139 03/06/22 0333  Weight: 67.2 kg 68.4 kg 66.5 kg    Examination: General: chronically ill-appearing  HEENT: ETT in place Extremities: Thin, muscle wasting present Abdomen: distended but not taut Heart: RRR, S1-S2 Lungs: Bilateral mechanically ventilated breath sounds, equal chest rise Neuro: can stick out tongue with a lot of encouragement    Assessment & Plan:   Acute hypoxemic respiratory failure requiring intubation mechanical ventilation Plan: Full vent support, SBT today Wean PEEP and FiO2 to maintain sats above 90% Keep sedation light, avoid continuous opioids as able - prn fentanyl for rass 0 to -1  Severe Sepsis due to ESBL Klebsiella UTI with AKI Stop ceftriaxone Continue ertapenem 3d course  Acute metabolic toxic encephalopathy secondary to hepatic encephalopathy and hyperammonemia Acute on chronic decompensated liver cirrhosis secondary  to alcohol use Restrictive respiratory physiology secondary to severe abdominal distention and alcoholic ascites status post large-volume paracentesis Plan: Continue rifaximin Continue lactulose target 1L stool output Continue vitamin supplements, thiamine multivitamin and zinc  Acute renal failure, at risk for hepatorenal syndrome, improving Hyponatremia Hypoalbuminemia Plan: Stop midodrine, octreotide   GoC: Per sister her last drink was 1 year ago, per husband some MJ use recently however.    Labs   CBC: Recent Labs  Lab 03/01/22 2252 03/02/22 0600 03/05/22 0654 03/05/22 0930 03/06/22 0213  WBC 19.7* 15.1* 6.6 6.6 6.8  HGB 8.8* 8.0* 6.2* 6.3* 7.5*  HCT 27.6* 24.5* 20.0* 19.4* 23.0*  MCV 91.7 89.7 93.0 91.9 90.9  PLT 192 158 108* 105* 106*    Basic Metabolic Panel: Recent Labs  Lab 03/03/22 0556 03/03/22 1632 03/04/22 0050 03/04/22 1658 03/05/22 0654 03/06/22 0213  NA  --  135 135 135 138 142  K  --  3.9 3.4* 3.8 3.9 3.8  CL  --  106 107 108 108 109  CO2  --  19* 19* 17* 18* 22  GLUCOSE  --  136* 133* 123* 150* 148*  BUN  --  29* 31* 31* 31* 28*  CREATININE  --  1.86* 1.74* 1.18* 0.90 0.74  CALCIUM  --  8.9 9.0 9.3 9.5 9.6  MG 2.4 2.4 2.4 2.4 2.5*  --   PHOS 7.5* 6.8* 5.7* 3.4 2.7  --    GFR: Estimated Creatinine Clearance: 87.5 mL/min (by C-G formula based on SCr of 0.74 mg/dL). Recent Labs  Lab 03/02/22 0600 03/05/22 0654 03/05/22 0930 03/06/22 0213  WBC 15.1* 6.6 6.6 6.8    Liver Function Tests: Recent Labs  Lab 03/01/22 1050 03/02/22 0600 03/04/22 0050 03/05/22 0930  AST 115* 95* 73* 58*  ALT 51* 48* 32 28  ALKPHOS 131* 103 78 76  BILITOT 2.5* 2.3* 1.6* 2.5*  PROT 5.8* 5.7* 6.1* 5.9*  ALBUMIN 2.9* 3.2* 4.4 4.1   No results for input(s): "LIPASE", "AMYLASE" in the last 168 hours. Recent Labs  Lab 03/01/22 1050 03/02/22 1041 03/04/22 0050 03/05/22 1312  AMMONIA 289* 133* 133* 162*    ABG    Component Value Date/Time    PHART 7.38 03/01/2022 1656   PCO2ART 30 (L) 03/01/2022 1656   PO2ART 160 (H) 03/01/2022 1656   HCO3 18.1 (L) 03/01/2022 1656   ACIDBASEDEF 6.6 (H) 03/01/2022 1656   O2SAT 100 03/01/2022 1656     Coagulation Profile: Recent Labs  Lab 03/02/22 1248 03/03/22 0556 03/04/22 0050 03/05/22 0654 03/06/22 0213  INR 1.8* 1.9* 2.1* 1.9* 1.8*    Cardiac Enzymes: No results for input(s): "CKTOTAL", "CKMB", "CKMBINDEX", "TROPONINI" in the last 168 hours.  HbA1C: No results found for: "HGBA1C"  CBG: Recent Labs  Lab 03/05/22 1058 03/05/22 1934 03/05/22 2324  03/06/22 0329 03/06/22 0732  GLUCAP 124* 127* 126* 140* 124*     Past Medical History:  She,  has a past medical history of Anemia, Cirrhosis ([place]), DVT (deep venous thrombosis) ([place]), Low iron, Neuropathy, Neuropathy, Panic attacks, and Pulmonary embolus ([place]) (2019).   Surgical History:   Past Surgical History:  Procedure Laterality Date   ABDOMINAL HYSTERECTOMY     BIOPSY N/A 06/24/2013   Procedure: BIOPSY TERMINAL ILEUM;  Surgeon: Daneil Dolin, MD;  [facility];  Service: Endoscopy;  Laterality: N/A;   CHOLECYSTECTOMY     COLONOSCOPY N/A 06/24/2013   [id number]:[id number] hemorrhoids/otherwise normal    COLONOSCOPY WITH PROPOFOL N/A 05/31/2021   Surgeon: Daneil Dolin, MD; ery large nonbleeding internal and external hemorrhoids, portal colopathy.  Recommended repeat colonoscopy in 10 years.   ESOPHAGOGASTRODUODENOSCOPY (EGD) WITH PROPOFOL N/A 03/29/2021   Surgeon: Eloise Harman, DO;  grade 1 esophageal varices, portal hypertensive gastropathy with friable tissue with spontaneous ooze, normal duodenum.   ESOPHAGOGASTRODUODENOSCOPY (EGD) WITH PROPOFOL N/A 05/31/2021   Surgeon: Daneil Dolin, MD;  grade 1-2 esophageal varices without bleeding stigmata, advanced appearing portal hypertensive gastropathy.   ESOPHAGOGASTRODUODENOSCOPY (EGD) WITH PROPOFOL N/A 12/22/2021   Procedure: ESOPHAGOGASTRODUODENOSCOPY  (EGD) WITH PROPOFOL;  Surgeon: Harvel Quale, MD;  Location: [facility];  Service: Gastroenterology;  Laterality: N/A;   nasal bone surgery     NASAL SINUS SURGERY     SINUS IRRIGATION     TONSILLECTOMY     TUBAL LIGATION       Social History:   reports that she has been smoking cigarettes. She has a 6.00 pack-year smoking history. She has never used smokeless tobacco. She reports that she does not currently use alcohol after a past usage of about 4.0 - 5.0 standard drinks of alcohol per week. She reports current drug use. Drug: Marijuana.   Family History:  Her family history includes COPD in her mother; Cancer in an other family member; Cirrhosis in her father; Crohn's disease in her maternal grandmother; Diabetes in her mother and sister; Hypertension in her mother and sister. There is no history of Colon cancer or Autoimmune disease.   Allergies Allergies  Allergen Reactions   Demerol Anaphylaxis    Per patient cardiac arrest     Home Medications  Prior to Admission medications   Medication Sig Start Date End Date Taking? Authorizing Provider  CONSTULOSE 10 GM/15ML solution Take 30 mLs (20 g total) by mouth 3 (three) times daily. 12/22/21  Yes Kathie Dike, MD  furosemide (LASIX) 40 MG tablet TAKE 1 TABLET BY MOUTH DAILY 01/02/22  Yes Annitta Needs, NP  gabapentin (NEURONTIN) 300 MG capsule Take 300 mg by mouth 3 (three) times daily as needed (nerve pain). 02/25/21  Yes [provider]  hydrOXYzine (VISTARIL) 25 MG capsule Take 25 mg by mouth 3 (three) times daily as needed for anxiety. 11/13/21  Yes [provider]  meclizine (ANTIVERT) 12.5 MG tablet Take 12.5 mg by mouth 3 (three) times daily as needed for dizziness or nausea. 02/23/22 03/25/22 Yes [provider]  methocarbamol (ROBAXIN) 500 MG tablet Take 500 mg by mouth at bedtime as needed for muscle spasms. 11/29/21  Yes [provider]  metoCLOPramide (REGLAN) 5 MG tablet  Take 5 mg by mouth 2 (two) times daily as needed for nausea or vomiting. 01/14/22  Yes [provider]  ondansetron (ZOFRAN-ODT) 4 MG disintegrating tablet Take 1 tablet (4 mg total) by mouth every 8 (  eight) hours as needed for nausea or vomiting. 12/03/21  Yes Godfrey Pick, MD  oxyCODONE (OXY IR/ROXICODONE) 5 MG immediate release tablet Take 5 mg by mouth every 4 (four) hours as needed for moderate pain. 02/23/22  Yes [provider]  pantoprazole (PROTONIX) 40 MG tablet Take 1 tablet (40 mg total) by mouth 2 (two) times daily before a meal. Patient taking differently: Take 40 mg by mouth daily. 03/30/21  Yes Kathie Dike, MD  promethazine (PHENERGAN) 12.5 MG tablet Take 12.5 mg by mouth 3 (three) times daily as needed for nausea or vomiting. 02/26/22  Yes [provider]  rifaximin (XIFAXAN) 550 MG TABS tablet Take 550 mg by mouth 2 (two) times daily. 02/23/22 05/24/22 Yes [provider]  spironolactone (ALDACTONE) 100 MG tablet TAKE 1 TABLET BY MOUTH DAILY 01/02/22  Yes Annitta Needs, NP     This patient is critically ill with multiple organ system failure; which, requires frequent high complexity decision making, assessment, support, evaluation, and titration of therapies. This was completed through the application of advanced monitoring technologies and extensive interpretation of multiple databases. During this encounter critical care time was devoted to patient care services described in this note for 35 minutes.  Fredirick Maudlin Pulmonary/Critical Care  03/06/2022 10:57 AM

## 2022-03-07 DIAGNOSIS — K7682 Hepatic encephalopathy: Secondary | ICD-10-CM | POA: Diagnosis not present

## 2022-03-07 LAB — BASIC METABOLIC PANEL
Anion gap: 8 (ref 5–15)
BUN: 28 mg/dL — ABNORMAL HIGH (ref 6–20)
CO2: 25 mmol/L (ref 22–32)
Calcium: 9.6 mg/dL (ref 8.9–10.3)
Chloride: 112 mmol/L — ABNORMAL HIGH (ref 98–111)
Creatinine, Ser: 0.66 mg/dL (ref 0.44–1.00)
GFR, Estimated: >60 mL/min (ref >60)
Glucose, Bld: 117 mg/dL — ABNORMAL HIGH (ref 70–99)
Potassium: 3.5 mmol/L (ref 3.5–5.1)
Sodium: 145 mmol/L (ref 135–145)

## 2022-03-07 LAB — CBC
HCT: 22.1 % — ABNORMAL LOW (ref 36.0–46.0)
Hemoglobin: 7.2 g/dL — ABNORMAL LOW (ref 12.0–15.0)
MCH: 29.5 pg (ref 26.0–34.0)
MCHC: 32.6 g/dL (ref 30.0–36.0)
MCV: 90.6 fL (ref 80.0–100.0)
Platelets: 108 10*3/uL — ABNORMAL LOW (ref 150–400)
RBC: 2.44 MIL/uL — ABNORMAL LOW (ref 3.87–5.11)
RDW: 17.5 % — ABNORMAL HIGH (ref 11.5–15.5)
WBC: 6.1 10*3/uL (ref 4.0–10.5)
nRBC: 0 % (ref 0.0–0.2)

## 2022-03-07 LAB — CULTURE, BLOOD (ROUTINE X 2)
Culture: NO GROWTH
Culture: NO GROWTH

## 2022-03-07 LAB — GLUCOSE, CAPILLARY
Glucose-Capillary: 110 mg/dL — ABNORMAL HIGH (ref 70–99)
Glucose-Capillary: 116 mg/dL — ABNORMAL HIGH (ref 70–99)
Glucose-Capillary: 157 mg/dL — ABNORMAL HIGH (ref 70–99)
Glucose-Capillary: 109 mg/dL — ABNORMAL HIGH (ref 70–99)
Glucose-Capillary: 96 mg/dL (ref 70–99)
Glucose-Capillary: 102 mg/dL — ABNORMAL HIGH (ref 70–99)

## 2022-03-07 LAB — AMMONIA: Ammonia: 41 umol/L — ABNORMAL HIGH (ref 9–35)

## 2022-03-07 LAB — PROTIME-INR
INR: 1.8 — ABNORMAL HIGH (ref 0.8–1.2)
Prothrombin Time: 21.1 seconds — ABNORMAL HIGH (ref 11.4–15.2)

## 2022-03-07 MED ORDER — LACTULOSE 10 GM/15ML PO SOLN
10.0000 g | Freq: Three times a day (TID) | ORAL | Status: DC
  Administered 2022-03-07 – 2022-03-15 (×26): 10 g
  Filled 2022-03-07 (×2): qty 30
  Filled 2022-03-07: qty 15
  Filled 2022-03-07 (×6): qty 30
  Filled 2022-03-07 (×2): qty 15
  Filled 2022-03-07 (×4): qty 30
  Filled 2022-03-07: qty 15
  Filled 2022-03-07 (×9): qty 30

## 2022-03-07 MED ORDER — CIPROFLOXACIN HCL 500 MG PO TABS
500.0000 mg | ORAL_TABLET | Freq: Every day | ORAL | Status: DC
  Filled 2022-03-07: qty 1

## 2022-03-07 MED ORDER — FUROSEMIDE 20 MG PO TABS
20.0000 mg | ORAL_TABLET | Freq: Every day | ORAL | Status: DC
  Administered 2022-03-08 – 2022-03-13 (×6): 20 mg
  Filled 2022-03-07 (×6): qty 1

## 2022-03-07 MED ORDER — POTASSIUM CHLORIDE 20 MEQ PO PACK
40.0000 meq | PACK | Freq: Once | ORAL | Status: AC
  Administered 2022-03-07: 40 meq
  Filled 2022-03-07: qty 2

## 2022-03-07 NOTE — Progress Notes (Signed)
RN entered room because patient was coughing. Family giving patient tea. RN educated family and patient on NPO status, and need for swallow eval after extubation before drinking.

## 2022-03-07 NOTE — Procedures (Signed)
Extubation Procedure Note  Patient Details:   Name: Erica Banks DOB: [birth year] MRN: [id number]   Airway Documentation:    Vent end date: 03/07/22 Vent end time: 0936   Evaluation  O2 sats: stable throughout Complications: No apparent complications Patient did tolerate procedure well. Bilateral Breath Sounds: Clear, Diminished, Coarse crackles   Yes Pt extubated to 4lpm [place] at this time.  Pt VS WNR.  [name] 03/07/2022, 9:36 AM

## 2022-03-07 NOTE — TOC Progression Note (Signed)
Transition of Care [facility]) - Progression Note    Patient Details  Name: Erica Banks MRN: [id number] Date of Birth: 1980/09/19  [facility]) CM/SW [place], RN Phone Number: 03/07/2022, 3:59 PM  Clinical Narrative:     Consult for Assistance with getting records over to [facility] transplant services. Patient is already connected with [facility]  liver and transplant clinic. Called them to see where she was in the process. They stated they only saw her once last year and it was for liver care, not transplant.  D/w RN. PCP would probably be the correct people to start the process, as the [facility]   Will follow for [facility] needs       Expected Discharge Plan and Services                                               Social Determinants of Health (SDOH) Interventions SDOH Screenings   Food Insecurity: No Food Insecurity (02/13/2022)  Housing: Low Risk  (02/13/2022)  Transportation Needs: No Transportation Needs (02/13/2022)  Utilities: Not At Risk (02/13/2022)  Tobacco Use: High Risk (03/01/2022)    Readmission Risk Interventions    02/14/2022    2:11 PM 12/19/2021    8:54 AM  Readmission Risk Prevention Plan  Transportation Screening Complete Complete  HRI or Home Care Consult Complete Complete  Social Work Consult for [place] Planning/Counseling Complete Complete  Palliative Care Screening Not Applicable Not Applicable  Medication Review Press photographer) Complete Complete

## 2022-03-07 NOTE — Progress Notes (Addendum)
NAME:  Erica Banks, MRN:  [id number], DOB:  04-23-1980, LOS: 6 ADMISSION DATE:  03/01/2022, CONSULTATION DATE:  2/2 REFERRING MD:  [facility] -ED / Dr Sharlett Iles, CHIEF COMPLAINT:  ams   History of Present Illness:  42 yowf with Etoh Cirrhosis /ascites and h/o hep encephalopathy d/c from [facility] 1/29  to GI clinic 2/2 am  to schedule paracentesis but woke up confused > referred from GI to ER lethargic, risk of aspiration and need for rx with lactulose so electively intubated by EDP and request for consultation/ transfer to [facility] if bed available   Pertinent  Medical History  42 y.o. 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  Also with history of IDA following with hematology with prescription iron infusions, GERD, PE, DVT, neuropathy.   Past Medical History:  Diagnosis Date   Anemia    Cirrhosis ([place])    DVT (deep venous thrombosis) (HCC)    Low iron    Neuropathy    Neuropathy    Panic attacks    Pulmonary embolus ([place]) 2019     Significant Hospital Events: Including procedures, antibiotic start and stop dates in addition to other pertinent events   Oral ET  2/2 >>> UDS  2/2   pos benzo's and THC 2/9 extubate  Scheduled Meds:  Chlorhexidine Gluconate Cloth  6 each Topical Q0600   docusate  100 mg Per Tube BID   feeding supplement (PROSource TF20)  60 mL Per Tube Daily   folic acid  1 mg Per Tube Daily   free water  100 mL Per Tube Q6H   furosemide  20 mg Oral Daily   heparin  5,000 Units Subcutaneous Q8H   lactulose  20 g Per Tube TID   midodrine  5 mg Per Tube TID WC   multivitamin with minerals  1 tablet Per Tube Daily   mouth rinse  15 mL Mouth Rinse Q2H   pantoprazole (PROTONIX) IV  40 mg Intravenous Q24H   polyethylene glycol  17 g Per Tube QID   rifaximin  550 mg Per Tube BID   spironolactone  50 mg Per Tube Daily   thiamine (VITAMIN B1) injection  100 mg  Intravenous Daily   zinc sulfate  220 mg Per Tube Daily   Continuous Infusions:  sodium chloride Stopped (03/04/22 1519)   ertapenem 1,000 mg (03/06/22 1431)   feeding supplement (VITAL 1.5 CAL) Stopped (03/05/22 1830)   PRN Meds:.sodium chloride, fentaNYL (SUBLIMAZE) injection, fentaNYL (SUBLIMAZE) injection, lip balm, ondansetron (ZOFRAN) IV, mouth rinse, phenylephrine-shark liver oil-mineral oil-petrolatum, polyvinyl alcohol   Interim History / Subjective:   No acute issues overnight. She is alert, awake and interactive this AM  Objective   Blood pressure (!) 141/89, pulse 91, temperature 98.2 F (36.8 C), temperature source Oral, resp. rate 15, height 5' 6"$  (1.676 m), weight 67.3 kg, SpO2 100 %.    Vent Mode: PCV FiO2 (%):  [30 %] 30 % Set Rate:  [14 bmp] 14 bmp PEEP:  [5 cmH20] 5 cmH20 Pressure Support:  [10 cmH20] 10 cmH20 Plateau Pressure:  [18 cmH20-19 cmH20] 18 cmH20   Intake/Output Summary (Last 24 hours) at 03/07/2022 0818 Last data filed at 03/07/2022 0630 Gross per 24 hour  Intake 55.87 ml  Output 3135 ml  Net -3079.13 ml   Filed Weights   03/05/22 0139 03/06/22 0333 03/07/22 0500  Weight: 68.4 kg 66.5 kg 67.3 kg  Examination: General: chronically ill-appearing. HEENT: Core track in place Extremities: Thin, muscle wasting present.  +2 lower extremity edema Abdomen: distended but not taut Heart: RRR, S1-S2 Lungs: no wheezing Neuro: follows commands, communicate effectively  Assessment & Plan:  Acute hypoxemic respiratory failure requiring intubation mechanical ventilation Successfully extubated today.  Currently on 2 L nasal cannula -PT/OT -fail swallow eval. Cortrak in place for TF. If no improvement, will need direct visualization of vocal cord.   Acute metabolic toxic encephalopathy secondary to hepatic encephalopathy and hyperammonemia Acute on chronic decompensated liver cirrhosis secondary to alcohol use Last para 2/5 - 3L.  Continue rifaximin  and lactulose.  Cipro for SBP proph Continue vitamin supplements, thiamine multivitamin and zinc Continue lasix 20 mg daily, spironolactone 50 mg daily. Hold off on increase dose since she was NPO since yesterday  Severe Sepsis due to ESBL Klebsiella UTI with AKI Continue ertapenem 3d course   Acute renal failure - resolved  Malnutrition -Continue TF through cortrak  Hypernatremia  -Add free water  Labs   CBC: Recent Labs  Lab 03/02/22 0600 03/05/22 0654 03/05/22 0930 03/06/22 0213 03/07/22 0138  WBC 15.1* 6.6 6.6 6.8 6.1  HGB 8.0* 6.2* 6.3* 7.5* 7.2*  HCT 24.5* 20.0* 19.4* 23.0* 22.1*  MCV 89.7 93.0 91.9 90.9 90.6  PLT 158 108* 105* 106* 108*    Basic Metabolic Panel: Recent Labs  Lab 03/03/22 0556 03/03/22 1632 03/04/22 0050 03/04/22 1658 03/05/22 0654 03/06/22 0213 03/07/22 0138  NA  --  135 135 135 138 142 145  K  --  3.9 3.4* 3.8 3.9 3.8 3.5  CL  --  106 107 108 108 109 112*  CO2  --  19* 19* 17* 18* 22 25  GLUCOSE  --  136* 133* 123* 150* 148* 117*  BUN  --  29* 31* 31* 31* 28* 28*  CREATININE  --  1.86* 1.74* 1.18* 0.90 0.74 0.66  CALCIUM  --  8.9 9.0 9.3 9.5 9.6 9.6  MG 2.4 2.4 2.4 2.4 2.5*  --   --   PHOS 7.5* 6.8* 5.7* 3.4 2.7  --   --    GFR: Estimated Creatinine Clearance: 87.5 mL/min (by C-G formula based on SCr of 0.66 mg/dL). Recent Labs  Lab 03/05/22 0654 03/05/22 0930 03/06/22 0213 03/07/22 0138  WBC 6.6 6.6 6.8 6.1    Liver Function Tests: Recent Labs  Lab 03/01/22 1050 03/02/22 0600 03/04/22 0050 03/05/22 0930  AST 115* 95* 73* 58*  ALT 51* 48* 32 28  ALKPHOS 131* 103 78 76  BILITOT 2.5* 2.3* 1.6* 2.5*  PROT 5.8* 5.7* 6.1* 5.9*  ALBUMIN 2.9* 3.2* 4.4 4.1   No results for input(s): "LIPASE", "AMYLASE" in the last 168 hours. Recent Labs  Lab 03/01/22 1050 03/02/22 1041 03/04/22 0050 03/05/22 1312 03/07/22 0138  AMMONIA 289* 133* 133* 162* 41*    ABG    Component Value Date/Time   PHART 7.38 03/01/2022 1656    PCO2ART 30 (L) 03/01/2022 1656   PO2ART 160 (H) 03/01/2022 1656   HCO3 18.1 (L) 03/01/2022 1656   ACIDBASEDEF 6.6 (H) 03/01/2022 1656   O2SAT 100 03/01/2022 1656     Coagulation Profile: Recent Labs  Lab 03/03/22 0556 03/04/22 0050 03/05/22 0654 03/06/22 0213 03/07/22 0138  INR 1.9* 2.1* 1.9* 1.8* 1.8*    Cardiac Enzymes: No results for input(s): "CKTOTAL", "CKMB", "CKMBINDEX", "TROPONINI" in the last 168 hours.  HbA1C: No results found for: "HGBA1C"  CBG: Recent Labs  Lab 03/06/22  1535 03/06/22 1929 03/06/22 2319 03/07/22 0329 03/07/22 0753  GLUCAP 118* 119* 119* 110* 116*     Past Medical History:  She,  has a past medical history of Anemia, Cirrhosis ([place]), DVT (deep venous thrombosis) ([place]), Low iron, Neuropathy, Neuropathy, Panic attacks, and Pulmonary embolus ([place]) (2019).   Surgical History:   Past Surgical History:  Procedure Laterality Date   ABDOMINAL HYSTERECTOMY     BIOPSY N/A 06/24/2013   Procedure: BIOPSY TERMINAL ILEUM;  Surgeon: Daneil Dolin, MD;  Location: [facility];  Service: Endoscopy;  Laterality: N/A;   CHOLECYSTECTOMY     COLONOSCOPY N/A 06/24/2013   [id number] hemorrhoids/otherwise normal    COLONOSCOPY WITH PROPOFOL N/A 05/31/2021   Surgeon: Daneil Dolin, MD; ery large nonbleeding internal and external hemorrhoids, portal colopathy.  Recommended repeat colonoscopy in 10 years.   ESOPHAGOGASTRODUODENOSCOPY (EGD) WITH PROPOFOL N/A 03/29/2021   Surgeon: Eloise Harman, DO;  grade 1 esophageal varices, portal hypertensive gastropathy with friable tissue with spontaneous ooze, normal duodenum.   ESOPHAGOGASTRODUODENOSCOPY (EGD) WITH PROPOFOL N/A 05/31/2021   Surgeon: Daneil Dolin, MD;  grade 1-2 esophageal varices without bleeding stigmata, advanced appearing portal hypertensive gastropathy.   ESOPHAGOGASTRODUODENOSCOPY (EGD) WITH PROPOFOL N/A 12/22/2021   Procedure: ESOPHAGOGASTRODUODENOSCOPY (EGD) WITH PROPOFOL;  Surgeon:  Harvel Quale, MD;  Location: [facility];  Service: Gastroenterology;  Laterality: N/A;   nasal bone surgery     NASAL SINUS SURGERY     SINUS IRRIGATION     TONSILLECTOMY     TUBAL LIGATION       Social History:   reports that she has been smoking cigarettes. She has a 6.00 pack-year smoking history. She has never used smokeless tobacco. She reports that she does not currently use alcohol after a past usage of about 4.0 - 5.0 standard drinks of alcohol per week. She reports current drug use. Drug: Marijuana.   Family History:  Her family history includes COPD in her mother; Cancer in an other family member; Cirrhosis in her father; Crohn's disease in her maternal grandmother; Diabetes in her mother and sister; Hypertension in her mother and sister. There is no history of Colon cancer or Autoimmune disease.   Allergies Allergies  Allergen Reactions   Demerol Anaphylaxis    Per patient cardiac arrest     Home Medications  Prior to Admission medications   Medication Sig Start Date End Date Taking? Authorizing Provider  CONSTULOSE 10 GM/15ML solution Take 30 mLs (20 g total) by mouth 3 (three) times daily. 12/22/21  Yes Kathie Dike, MD  furosemide (LASIX) 40 MG tablet TAKE 1 TABLET BY MOUTH DAILY 01/02/22  Yes Annitta Needs, NP  gabapentin (NEURONTIN) 300 MG capsule Take 300 mg by mouth 3 (three) times daily as needed (nerve pain). 02/25/21  Yes [provider]  hydrOXYzine (VISTARIL) 25 MG capsule Take 25 mg by mouth 3 (three) times daily as needed for anxiety. 11/13/21  Yes [provider]  meclizine (ANTIVERT) 12.5 MG tablet Take 12.5 mg by mouth 3 (three) times daily as needed for dizziness or nausea. 02/23/22 03/25/22 Yes [provider]  methocarbamol (ROBAXIN) 500 MG tablet Take 500 mg by mouth at bedtime as needed for muscle spasms. 11/29/21  Yes [provider]  metoCLOPramide (REGLAN) 5 MG tablet Take 5 mg by mouth 2 (two) times  daily as needed for nausea or vomiting. 01/14/22  Yes [provider]  ondansetron (ZOFRAN-ODT) 4 MG disintegrating tablet Take 1 tablet (4 mg  total) by mouth every 8 (eight) hours as needed for nausea or vomiting. 12/03/21  Yes Godfrey Pick, MD  oxyCODONE (OXY IR/ROXICODONE) 5 MG immediate release tablet Take 5 mg by mouth every 4 (four) hours as needed for moderate pain. 02/23/22  Yes [provider]  pantoprazole (PROTONIX) 40 MG tablet Take 1 tablet (40 mg total) by mouth 2 (two) times daily before a meal. Patient taking differently: Take 40 mg by mouth daily. 03/30/21  Yes Kathie Dike, MD  promethazine (PHENERGAN) 12.5 MG tablet Take 12.5 mg by mouth 3 (three) times daily as needed for nausea or vomiting. 02/26/22  Yes [provider]  rifaximin (XIFAXAN) 550 MG TABS tablet Take 550 mg by mouth 2 (two) times daily. 02/23/22 05/24/22 Yes [provider]  spironolactone (ALDACTONE) 100 MG tablet TAKE 1 TABLET BY MOUTH DAILY 01/02/22  Yes Annitta Needs, NP     Gaylan Gerold, DO Internal Medicine Residency My pager: [phone]

## 2022-03-07 NOTE — Progress Notes (Signed)
[facility] ADULT ICU REPLACEMENT PROTOCOL   The patient does apply for the [facility] Adult ICU Electrolyte Replacment Protocol based on the criteria listed below:   1.Exclusion criteria: TCTS, ECMO, Dialysis, and Myasthenia Gravis patients 2. Is GFR >/= 30 ml/min? Yes.    Patient's GFR today is >60 3. Is SCr </= 2? Yes.   Patient's SCr is 0.66 mg/dL 4. Did SCr increase >/= 0.5 in 24 hours? No. 5.Pt's weight >40kg  Yes.   6. Abnormal electrolyte(s): potassium 3.5  7. Electrolytes replaced per protocol 8.  Call MD STAT for K+ </= 2.5, Phos </= 1, or Mag </= 1 Physician:  protocol  Darlys Gales 03/07/2022 3:17 AM

## 2022-03-07 NOTE — Progress Notes (Signed)
eLink Physician-Brief Progress Note Patient Name: Erica Banks DOB: 07-21-1980 MRN: [id number]   Date of Service  03/07/2022  HPI/Events of Note  Agitation - Patient attempting to pull out ETT. Nursing request for bilateral wrist restraints.   eICU Interventions  Will order bilateral soft wrist restraints X 4 hours.      Intervention Category Major Interventions: Delirium, psychosis, severe agitation - evaluation and management  Ndidi Nesby Eugene 03/07/2022, 5:40 AM

## 2022-03-07 NOTE — Progress Notes (Signed)
Nutrition Follow-up  DOCUMENTATION CODES:  Not applicable  INTERVENTION:  Monitor for diet advancement Plan for placement of cortrak tube 2/9. Recommend the following regimen: Osmolite 1.5 @ 27mL/h (1354mL/d) Start at 25 and advance by 53mL q8h to goal. Monitor for intolerance. Prosource TF 20 1x/d This will provide 2060kcal, 102g of protein, and 1047mL of free water Continue vitamin regimen for hx EtOH abuse  NUTRITION DIAGNOSIS:  Inadequate oral intake related to inability to eat as evidenced by NPO status. - new dx established 2/8  GOAL:  Patient will meet greater than or equal to 90% of their needs - not progressing, extubated, no enteral access, NPO.  MONITOR:  Vent status, Labs, Weight trends, TF tolerance, I & O's  REASON FOR ASSESSMENT:  Ventilator, Consult Enteral/tube feeding initiation and management  ASSESSMENT:  42 year old female who presented to the ED on 2/02 with AMS. 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 Pt required intubation. Pt admitted with severe hepatic encephalopathy, acute renal failure.  Patient able to be extubated this AM. Currently NPO awaiting an SLP evaluation after pt was coughing with liquids after extubation.  Previously spoke with husband at bedside. Reports that pt has a very poor appetite at baseline. States that she only eats 1 meal each day and that she has lost a significant amount of weight. Discussed with husband at that time that cortrak would be beneficial in meeting pt's increased needs while she is admitted. He was agreeable to the idea. Pt with significant muscle and fat wasting to her upper body. Fluid was present to her legs and abdomen at the time of exam.   Noted that pt has intermittently not tolerated previously ordered TF. Will adjust formula when cortrak is placed to determine if it can be better  tolerated.   Intake/Output Summary (Last 24 hours) at 03/07/2022 1102 Last data filed at 03/07/2022 0800 Gross per 24 hour  Intake 305.87 ml  Output 3135 ml  Net -2829.13 ml  Net IO Since Admission: -5,729.76 mL [03/07/22 1102]   Nutritionally Relevant Medications: Scheduled Meds:  docusate  100 mg Per Tube BID   PROSource TF20  60 mL Per Tube Daily   folic acid  1 mg Per Tube Daily   free water  100 mL Per Tube Q6H   furosemide  20 mg Oral Daily   lactulose  10 g Per Tube TID   multivitamin with minerals  1 tablet Per Tube Daily   mouth rinse  15 mL Mouth Rinse Q2H   pantoprazole (PROTONIX) IV  40 mg Intravenous Q24H   polyethylene glycol  17 g Per Tube QID   spironolactone  50 mg Per Tube Daily   thiamine (VITAMIN B1) injection  100 mg Intravenous Daily   zinc sulfate  220 mg Per Tube Daily   Continuous Infusions:  ertapenem 1,000 mg (03/06/22 1431)   feeding supplement (VITAL 1.5 CAL) Stopped (03/05/22 1830)   PRN Meds: ondansetron  Labs Reviewed: Chloride 112 BUN 28 CBG ranges from 110-124 mg/dL over the last 24 hours   NUTRITION - FOCUSED PHYSICAL EXAM: Flowsheet Row Most Recent Value  Orbital Region Severe depletion  Upper Arm Region Severe depletion  Thoracic and Lumbar Region Moderate depletion  Buccal Region Severe depletion  Temple Region Severe depletion  Clavicle Bone Region Severe depletion  Clavicle and Acromion Bone Region Severe depletion  Scapular Bone Region Moderate depletion  Dorsal Hand Unable to assess  Patellar Region Moderate depletion  Anterior Thigh Region Moderate depletion  Posterior Calf Region Mild depletion  Edema (RD Assessment) Moderate  [abdominal ascites, BLE]  Hair Reviewed  Eyes Reviewed  Mouth Reviewed  Skin Reviewed  Nails Unable to assess   Diet Order:   Diet Order             Diet NPO time specified Except for: Other (See Comments)  Diet effective now                   EDUCATION NEEDS:  Not appropriate for  education at this time  Skin:  Skin Assessment: Reviewed RN Assessment  Last BM:  2/8 - type 7  Height:  Ht Readings from Last 1 Encounters:  03/05/22 5\' 6"  (1.676 m)    Weight:  Wt Readings from Last 1 Encounters:  03/07/22 67.3 kg    Ideal Body Weight:  59.1 kg  BMI:  Body mass index is 23.95 kg/m.  Estimated Nutritional Needs:  Kcal:  2000-2200 Protein:  100-115 grams Fluid:  >1.8 L    Ranell Patrick, RD, LDN Clinical Dietitian RD pager # available in [place]  After hours/weekend pager # available in [facility]

## 2022-03-08 ENCOUNTER — Other Ambulatory Visit: Payer: Self-pay

## 2022-03-08 ENCOUNTER — Inpatient Hospital Stay (HOSPITAL_COMMUNITY): Payer: Medicaid Other

## 2022-03-08 DIAGNOSIS — K7460 Unspecified cirrhosis of liver: Secondary | ICD-10-CM | POA: Diagnosis not present

## 2022-03-08 DIAGNOSIS — N390 Urinary tract infection, site not specified: Secondary | ICD-10-CM

## 2022-03-08 DIAGNOSIS — B9689 Other specified bacterial agents as the cause of diseases classified elsewhere: Secondary | ICD-10-CM | POA: Diagnosis not present

## 2022-03-08 DIAGNOSIS — K7682 Hepatic encephalopathy: Secondary | ICD-10-CM | POA: Diagnosis not present

## 2022-03-08 DIAGNOSIS — E43 Unspecified severe protein-calorie malnutrition: Secondary | ICD-10-CM | POA: Insufficient documentation

## 2022-03-08 DIAGNOSIS — K7290 Hepatic failure, unspecified without coma: Secondary | ICD-10-CM | POA: Diagnosis not present

## 2022-03-08 LAB — GLUCOSE, CAPILLARY
Glucose-Capillary: 101 mg/dL — ABNORMAL HIGH (ref 70–99)
Glucose-Capillary: 104 mg/dL — ABNORMAL HIGH (ref 70–99)
Glucose-Capillary: 128 mg/dL — ABNORMAL HIGH (ref 70–99)
Glucose-Capillary: 144 mg/dL — ABNORMAL HIGH (ref 70–99)

## 2022-03-08 LAB — CBC
HCT: 23.2 % — ABNORMAL LOW (ref 36.0–46.0)
Hemoglobin: 7.4 g/dL — ABNORMAL LOW (ref 12.0–15.0)
MCH: 29.7 pg (ref 26.0–34.0)
MCHC: 31.9 g/dL (ref 30.0–36.0)
MCV: 93.2 fL (ref 80.0–100.0)
Platelets: 108 10*3/uL — ABNORMAL LOW (ref 150–400)
RBC: 2.49 MIL/uL — ABNORMAL LOW (ref 3.87–5.11)
RDW: 17.6 % — ABNORMAL HIGH (ref 11.5–15.5)
WBC: 5.8 10*3/uL (ref 4.0–10.5)
nRBC: 0 % (ref 0.0–0.2)

## 2022-03-08 LAB — PROTIME-INR
INR: 1.9 — ABNORMAL HIGH (ref 0.8–1.2)
Prothrombin Time: 21.7 seconds — ABNORMAL HIGH (ref 11.4–15.2)

## 2022-03-08 LAB — BASIC METABOLIC PANEL
Anion gap: 11 (ref 5–15)
BUN: 23 mg/dL — ABNORMAL HIGH (ref 6–20)
CO2: 24 mmol/L (ref 22–32)
Calcium: 9.6 mg/dL (ref 8.9–10.3)
Chloride: 114 mmol/L — ABNORMAL HIGH (ref 98–111)
Creatinine, Ser: 0.53 mg/dL (ref 0.44–1.00)
GFR, Estimated: >60 mL/min (ref >60)
Glucose, Bld: 111 mg/dL — ABNORMAL HIGH (ref 70–99)
Potassium: 3.8 mmol/L (ref 3.5–5.1)
Sodium: 149 mmol/L — ABNORMAL HIGH (ref 135–145)

## 2022-03-08 LAB — SURGICAL PCR SCREEN
MRSA, PCR: NEGATIVE
Staphylococcus aureus: NEGATIVE

## 2022-03-08 MED ORDER — PROSOURCE TF20 ENFIT COMPATIBL EN LIQD
60.0000 mL | Freq: Every day | ENTERAL | Status: DC
  Administered 2022-03-08 – 2022-03-15 (×8): 60 mL
  Filled 2022-03-08 (×8): qty 60

## 2022-03-08 MED ORDER — MUPIROCIN 2 % EX OINT
1.0000 | TOPICAL_OINTMENT | Freq: Two times a day (BID) | CUTANEOUS | Status: DC
  Administered 2022-03-08 – 2022-03-12 (×8): 1 via NASAL
  Filled 2022-03-08 (×2): qty 22

## 2022-03-08 MED ORDER — FOSFOMYCIN TROMETHAMINE 3 G PO PACK
3.0000 g | PACK | Freq: Once | ORAL | Status: AC
  Administered 2022-03-08: 3 g via ORAL
  Filled 2022-03-08: qty 3

## 2022-03-08 MED ORDER — OSMOLITE 1.5 CAL PO LIQD
1000.0000 mL | ORAL | Status: DC
  Administered 2022-03-08 – 2022-03-15 (×8): 1000 mL
  Filled 2022-03-08 (×6): qty 1000

## 2022-03-08 MED ORDER — FREE WATER
100.0000 mL | Freq: Four times a day (QID) | Status: DC
  Administered 2022-03-08 (×2): 100 mL

## 2022-03-08 MED ORDER — CIPROFLOXACIN HCL 500 MG PO TABS
500.0000 mg | ORAL_TABLET | Freq: Every day | ORAL | Status: DC
  Administered 2022-03-08 – 2022-03-15 (×8): 500 mg
  Filled 2022-03-08 (×9): qty 1

## 2022-03-08 MED ORDER — ADULT MULTIVITAMIN W/MINERALS CH
1.0000 | ORAL_TABLET | Freq: Every day | ORAL | Status: DC
  Administered 2022-03-09 – 2022-03-15 (×7): 1
  Filled 2022-03-08 (×7): qty 1

## 2022-03-08 MED ORDER — THIAMINE MONONITRATE 100 MG PO TABS
100.0000 mg | ORAL_TABLET | Freq: Every day | ORAL | Status: DC
  Administered 2022-03-09 – 2022-03-18 (×10): 100 mg via ORAL
  Filled 2022-03-08 (×10): qty 1

## 2022-03-08 NOTE — Procedures (Signed)
Cortrak  Person Inserting Tube:  Alroy Dust, Jacolyn Joaquin L, RD Tube Type:  Cortrak - 43 inches Tube Size:  10 Tube Location:  Right nare Secured by: Bridle Technique Used to Measure Tube Placement:  Marking at nare/corner of mouth Cortrak Secured At:  80 cm   Cortrak Tube Team Note:  Consult received to place a Cortrak feeding tube.   X-ray is required, abdominal x-ray has been ordered by the Cortrak team. Please confirm tube placement before using the Cortrak tube.   If the tube becomes dislodged please keep the tube and contact the Cortrak team at [email or web address] for replacement.  If after hours and replacement cannot be delayed, place a NG tube and confirm placement with an abdominal x-ray.    Hermina Barters RD, LDN Clinical Dietitian See Shea Evans for contact information.

## 2022-03-08 NOTE — Progress Notes (Signed)
NAME:  Erica Banks, MRN:  [id number], DOB:  [birth year], LOS: 6 ADMISSION DATE:  03/01/2022, CONSULTATION DATE:  2/2 REFERRING MD:  [facility] -ED / Dr Sharlett Iles, CHIEF COMPLAINT:  ams    History of Present Illness:  37 yowf with Etoh Cirrhosis /ascites and h/o hep encephalopathy d/c from [facility] 1/29  to GI clinic 2/2 am  to schedule paracentesis but woke up confused > referred from GI to ER lethargic, risk of aspiration and need for rx with lactulose so electively intubated by EDP and request for consultation/ transfer to [facility] if bed available    Pertinent  Medical History  42 y.o. 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  Also with history of IDA following with hematology with prescription iron infusions, GERD, PE, DVT, neuropathy.        Past Medical History:  Diagnosis Date   Anemia     Cirrhosis ([place])     DVT (deep venous thrombosis) (HCC)     Low iron     Neuropathy     Neuropathy     Panic attacks     Pulmonary embolus ([place]) 2019      Significant Hospital Events: Including procedures, antibiotic start and stop dates in addition to other pertinent events   Oral ET  2/2 >>> UDS  2/2   pos benzo's and THC   Scheduled Meds:  Chlorhexidine Gluconate Cloth  6 each Topical Q0600   docusate  100 mg Per Tube BID   feeding supplement (PROSource TF20)  60 mL Per Tube Daily   folic acid  1 mg Per Tube Daily   free water  100 mL Per Tube Q6H   furosemide  20 mg Oral Daily   heparin  5,000 Units Subcutaneous Q8H   lactulose  20 g Per Tube TID   midodrine  5 mg Per Tube TID WC   multivitamin with minerals  1 tablet Per Tube Daily   mouth rinse  15 mL Mouth Rinse Q2H   pantoprazole (PROTONIX) IV  40 mg Intravenous Q24H   polyethylene glycol  17 g Per Tube QID   rifaximin  550 mg Per Tube BID   spironolactone  50 mg Per Tube Daily   thiamine (VITAMIN B1) injection  100 mg  Intravenous Daily   zinc sulfate  220 mg Per Tube Daily    Continuous Infusions:  sodium chloride Stopped (03/04/22 1519)   ertapenem 1,000 mg (03/06/22 1431)   feeding supplement (VITAL 1.5 CAL) Stopped (03/05/22 1830)    PRN Meds:.sodium chloride, fentaNYL (SUBLIMAZE) injection, fentaNYL (SUBLIMAZE) injection, lip balm, ondansetron (ZOFRAN) IV, mouth rinse, phenylephrine-shark liver oil-mineral oil-petrolatum, polyvinyl alcohol    Interim History / Subjective:    No acute issues - much more alert this morning   Objective   Blood pressure (!) 141/89, pulse 91, temperature 98.2 F (36.8 C), temperature source Oral, resp. rate 15, height 5' 6"$  (1.676 m), weight 67.3 kg, SpO2 100 %.     >  Vent Mode: PCV FiO2 (%):  [30 %] 30 % Set Rate:  [14 bmp] 14 bmp PEEP:  [5 cmH20] 5 cmH20 Pressure Support:  [10 cmH20] 10 cmH20 Plateau Pressure:  [18 cmH20-19 cmH20] 18 cmH20      Intake/Output Summary (Last 24 hours) at 03/07/2022 0818 Last data filed at 03/07/2022 0630    Gross per 24 hour  Intake 55.87 ml  Output 3135 ml  Net -3079.13 ml         Filed Weights    03/05/22 0139 03/06/22 0333 03/07/22 0500  Weight: 68.4 kg 66.5 kg 67.3 kg      Examination: General: chronically ill-appearing HEENT: ETT in place Extremities: Thin, muscle wasting present Abdomen: distended but not taut Heart: RRR, S1-S2 Lungs: Bilateral mechanically ventilated breath sounds, equal chest rise Neuro: follows commands, grossly nonfocal     Assessment & Plan:    Acute hypoxemic respiratory failure requiring intubation mechanical ventilation Plan: SBT today, extubate   Severe Sepsis due to ESBL Klebsiella UTI with AKI Continue ertapenem 3d course   Acute metabolic toxic encephalopathy secondary to hepatic encephalopathy and hyperammonemia Acute on chronic decompensated liver cirrhosis secondary to alcohol use Restrictive respiratory physiology secondary to severe abdominal distention and alcoholic  ascites status post large-volume paracentesis Plan: Continue rifaximin Decrease lactulose target 1L stool output Continue vitamin supplements, thiamine multivitamin and zinc Continue lasix 20 mg daily, spironolactone 50 mg daily   Acute renal failure, at risk for hepatorenal syndrome, improving Hyponatremia Hypoalbuminemia Plan: Taper midodrine off     GoC: Per sister her last drink was 1 year ago, per husband some MJ use recently however.      Labs   CBC: Last Labs         Recent Labs  Lab 03/02/22 0600 03/05/22 0654 03/05/22 0930 03/06/22 0213 03/07/22 0138  WBC 15.1* 6.6 6.6 6.8 6.1  HGB 8.0* 6.2* 6.3* 7.5* 7.2*  HCT 24.5* 20.0* 19.4* 23.0* 22.1*  MCV 89.7 93.0 91.9 90.9 90.6  PLT 158 108* 105* 106* 108*        Basic Metabolic Panel: Last Labs           Recent Labs  Lab 03/03/22 0556 03/03/22 1632 03/04/22 0050 03/04/22 1658 03/05/22 0654 03/06/22 0213 03/07/22 0138  NA  --  135 135 135 138 142 145  K  --  3.9 3.4* 3.8 3.9 3.8 3.5  CL  --  106 107 108 108 109 112*  CO2  --  19* 19* 17* 18* 22 25  GLUCOSE  --  136* 133* 123* 150* 148* 117*  BUN  --  29* 31* 31* 31* 28* 28*  CREATININE  --  1.86* 1.74* 1.18* 0.90 0.74 0.66  CALCIUM  --  8.9 9.0 9.3 9.5 9.6 9.6  MG 2.4 2.4 2.4 2.4 2.5*  --   --   PHOS 7.5* 6.8* 5.7* 3.4 2.7  --   --       GFR: Estimated Creatinine Clearance: 87.5 mL/min (by C-G formula based on SCr of 0.66 mg/dL). Last Labs        Recent Labs  Lab 03/05/22 0654 03/05/22 0930 03/06/22 0213 03/07/22 0138  WBC 6.6 6.6 6.8 6.1        Liver Function Tests: Last Labs        Recent Labs  Lab 03/01/22 1050 03/02/22 0600 03/04/22 0050 03/05/22 0930  AST 115* 95* 73* 58*  ALT 51* 48* 32 28  ALKPHOS 131* 103 78 76  BILITOT 2.5* 2.3* 1.6* 2.5*  PROT 5.8* 5.7* 6.1* 5.9*  ALBUMIN 2.9* 3.2* 4.4 4.1      Last Labs  No results for input(s): "LIPASE", "AMYLASE" in the last 168 hours.   Last Labs         Recent Labs  Lab  03/01/22 1050 03/02/22 1041 03/04/22 0050 03/05/22 1312 03/07/22 0138  AMMONIA 289* 133* 133* 162* 41*  ABG Labs (Brief)          Component Value Date/Time    PHART 7.38 03/01/2022 1656    PCO2ART 30 (L) 03/01/2022 1656    PO2ART 160 (H) 03/01/2022 1656    HCO3 18.1 (L) 03/01/2022 1656    ACIDBASEDEF 6.6 (H) 03/01/2022 1656    O2SAT 100 03/01/2022 1656        Coagulation Profile: Last Labs         Recent Labs  Lab 03/03/22 0556 03/04/22 0050 03/05/22 0654 03/06/22 0213 03/07/22 0138  INR 1.9* 2.1* 1.9* 1.8* 1.8*        Cardiac Enzymes: Last Labs  No results for input(s): "CKTOTAL", "CKMB", "CKMBINDEX", "TROPONINI" in the last 168 hours.     HbA1C: Last Labs  No results found for: "HGBA1C"     CBG: Last Labs         Recent Labs  Lab 03/06/22 1535 03/06/22 1929 03/06/22 2319 03/07/22 0329 03/07/22 0753  GLUCAP 118* 119* 119* 110* 116*          Past Medical History:  She,  has a past medical history of Anemia, Cirrhosis ([place]), DVT (deep venous thrombosis) ([place]), Low iron, Neuropathy, Neuropathy, Panic attacks, and Pulmonary embolus ([place]) (2019).    Surgical History:         Past Surgical History:  Procedure Laterality Date   ABDOMINAL HYSTERECTOMY       BIOPSY N/A 06/24/2013    Procedure: BIOPSY TERMINAL ILEUM;  Surgeon: Daneil Dolin, MD;  [facility];  Service: Endoscopy;  Laterality: N/A;   CHOLECYSTECTOMY       COLONOSCOPY N/A 06/24/2013    [id number] hemorrhoids/otherwise normal    COLONOSCOPY WITH PROPOFOL N/A 05/31/2021    Surgeon: Daneil Dolin, MD; ery large nonbleeding internal and external hemorrhoids, portal colopathy.  Recommended repeat colonoscopy in 10 years.   ESOPHAGOGASTRODUODENOSCOPY (EGD) WITH PROPOFOL N/A 03/29/2021    Surgeon: Eloise Harman, DO;  grade 1 esophageal varices, portal hypertensive gastropathy with friable tissue with spontaneous ooze, normal duodenum.    ESOPHAGOGASTRODUODENOSCOPY (EGD) WITH PROPOFOL N/A 05/31/2021    Surgeon: Daneil Dolin, MD;  grade 1-2 esophageal varices without bleeding stigmata, advanced appearing portal hypertensive gastropathy.   ESOPHAGOGASTRODUODENOSCOPY (EGD) WITH PROPOFOL N/A 12/22/2021    Procedure: ESOPHAGOGASTRODUODENOSCOPY (EGD) WITH PROPOFOL;  Surgeon: Harvel Quale, MD;  [facility];  Service: Gastroenterology;  Laterality: N/A;   nasal bone surgery       NASAL SINUS SURGERY       SINUS IRRIGATION       TONSILLECTOMY       TUBAL LIGATION          Social History:   reports that she has been smoking cigarettes. She has a 6.00 pack-year smoking history. She has never used smokeless tobacco. She reports that she does not currently use alcohol after a past usage of about 4.0 - 5.0 standard drinks of alcohol per week. She reports current drug use. Drug: Marijuana.    Family History:  Her family history includes COPD in her mother; Cancer in an other family member; Cirrhosis in her father; Crohn's disease in her maternal grandmother; Diabetes in her mother and sister; Hypertension in her mother and sister. There is no history of Colon cancer or Autoimmune disease.    Allergies      Allergies  Allergen Reactions   Demerol Anaphylaxis      Per patient cardiac arrest  Home Medications         Prior to Admission medications   Medication Sig Start Date End Date Taking? Authorizing Provider  CONSTULOSE 10 GM/15ML solution Take 30 mLs (20 g total) by mouth 3 (three) times daily. 12/22/21   Yes Kathie Dike, MD  furosemide (LASIX) 40 MG tablet TAKE 1 TABLET BY MOUTH DAILY 01/02/22   Yes Annitta Needs, NP  gabapentin (NEURONTIN) 300 MG capsule Take 300 mg by mouth 3 (three) times daily as needed (nerve pain). 02/25/21   Yes [provider]  hydrOXYzine (VISTARIL) 25 MG capsule Take 25 mg by mouth 3 (three) times daily as needed for anxiety. 11/13/21   Yes [provider]  meclizine (ANTIVERT) 12.5 MG tablet Take 12.5 mg by mouth 3 (three) times daily as needed for dizziness or nausea. 02/23/22 03/25/22 Yes [provider]  methocarbamol (ROBAXIN) 500 MG tablet Take 500 mg by mouth at bedtime as needed for muscle spasms. 11/29/21   Yes [provider]  metoCLOPramide (REGLAN) 5 MG tablet Take 5 mg by mouth 2 (two) times daily as needed for nausea or vomiting. 01/14/22   Yes [provider]  ondansetron (ZOFRAN-ODT) 4 MG disintegrating tablet Take 1 tablet (4 mg total) by mouth every 8 (eight) hours as needed for nausea or vomiting. 12/03/21   Yes Godfrey Pick, MD  oxyCODONE (OXY IR/ROXICODONE) 5 MG immediate release tablet Take 5 mg by mouth every 4 (four) hours as needed for moderate pain. 02/23/22   Yes [provider]  pantoprazole (PROTONIX) 40 MG tablet Take 1 tablet (40 mg total) by mouth 2 (two) times daily before a meal. Patient taking differently: Take 40 mg by mouth daily. 03/30/21   Yes Kathie Dike, MD  promethazine (PHENERGAN) 12.5 MG tablet Take 12.5 mg by mouth 3 (three) times daily as needed for nausea or vomiting. 02/26/22   Yes [provider]  rifaximin (XIFAXAN) 550 MG TABS tablet Take 550 mg by mouth 2 (two) times daily. 02/23/22 05/24/22 Yes [provider]  spironolactone (ALDACTONE) 100 MG tablet TAKE 1 TABLET BY MOUTH DAILY 01/02/22   Yes Annitta Needs, NP      This patient is critically ill with multiple organ system failure; which, requires frequent high complexity decision making, assessment, support, evaluation, and titration of therapies. This was completed through the application of advanced monitoring technologies and extensive interpretation of multiple databases. During this encounter critical care time was devoted to patient care services described in this note for 34 minutes.   Fredirick Maudlin Pulmonary/Critical Care   03/07/2022 8:18 AM

## 2022-03-08 NOTE — Progress Notes (Signed)
Patient transfered via bed from 11M ; oriented to room and unit routine; equipment checked; husband in to visit at bedside.

## 2022-03-08 NOTE — Evaluation (Signed)
Clinical/Bedside Swallow Evaluation Patient Details  Name: Erica Banks MRN: [id number] Date of Birth: [birth year]  Today's Date: 03/08/2022 Time: SLP Start Time (ACUTE ONLY): [id number] SLP Stop Time (ACUTE ONLY): 0947 SLP Time Calculation (min) (ACUTE ONLY): 13 min  Past Medical History:  Past Medical History:  Diagnosis Date   Anemia    Cirrhosis ([place])    DVT (deep venous thrombosis) (HCC)    Low iron    Neuropathy    Neuropathy    Panic attacks    Pulmonary embolus ([place]) 2019   Past Surgical History:  Past Surgical History:  Procedure Laterality Date   ABDOMINAL HYSTERECTOMY     BIOPSY N/A 06/24/2013   Procedure: BIOPSY TERMINAL ILEUM;  Surgeon: Daneil Dolin, MD;  [facility];  Service: Endoscopy;  Laterality: N/A;   CHOLECYSTECTOMY     COLONOSCOPY N/A 06/24/2013   [id number] hemorrhoids/otherwise normal    COLONOSCOPY WITH PROPOFOL N/A 05/31/2021   Surgeon: Daneil Dolin, MD; ery large nonbleeding internal and external hemorrhoids, portal colopathy.  Recommended repeat colonoscopy in 10 years.   ESOPHAGOGASTRODUODENOSCOPY (EGD) WITH PROPOFOL N/A 03/29/2021   Surgeon: Eloise Harman, DO;  grade 1 esophageal varices, portal hypertensive gastropathy with friable tissue with spontaneous ooze, normal duodenum.   ESOPHAGOGASTRODUODENOSCOPY (EGD) WITH PROPOFOL N/A 05/31/2021   Surgeon: Daneil Dolin, MD;  grade 1-2 esophageal varices without bleeding stigmata, advanced appearing portal hypertensive gastropathy.   ESOPHAGOGASTRODUODENOSCOPY (EGD) WITH PROPOFOL N/A 12/22/2021   Procedure: ESOPHAGOGASTRODUODENOSCOPY (EGD) WITH PROPOFOL;  Surgeon: Harvel Quale, MD;  [facility];  Service: Gastroenterology;  Laterality: N/A;   nasal bone surgery     NASAL SINUS SURGERY     SINUS IRRIGATION     TONSILLECTOMY     TUBAL LIGATION     HPI:  Erica Banks is a 42 yo F with Etoh Cirrhosis/ascites and h/o hep encephalopathy d/c from [facility] 1/29  to GI clinic 2/2 am to schedule paracentesis but woke up confused > referred from GI to ED. Lethargic, risk of aspiration and need for rx with lactulose so electively intubated.  ETT 2/2-2/8. Per RD note husbad reports "pt has a very poor appetite at baseline. 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  Also with history of IDA following with hematology with prescription iron infusions, GERD, PE, DVT, neuropathy.    Assessment / Plan / Recommendation  Clinical Impression  Pt presents with clinical indicators of pharyngeal dysphagia following 6 day intubation.  Pt with very poor vocal quality on arrival: minimal phonation noted, very low intensity and breathy, wet congested quality noted as well.  Pt extubated over 24 hours, query change in VF physiology/function following intubation.  Pt with difficulty following directions for OME and very distractable.  Distractions elimiated in room, but pt still unable to complete OME tasks.  Consider cognitive linguistic evaluation it pt does not return to baseline mentation.  With trial of ice chip pt exhibited prompt oral response.  Pt required 3 swallows but with no changes to vocal quality or coughing noted.  With teaspoon of water there were multiple swallows, wet vocal quality, and weak v suspressed cough response.  Pt exhibited good oral clearance of puree but required 3 swallows.  Pt denied feeling of applesauce going the wrong way or of puree  sticking in throat.  Pt reports she does not want anything to eat or drink at present and has cortrak in place for nutrition.  Pt will likely need instrumental assessment prior to initiation of PO diet.  If vocal quality remains poor over weekend, recommend FEES over MBS to allow for visualization of the vocal folds. Pt and  family in agreement with plan of care.    Recommend pt remain NPO with alternate means of nutrition, hydration, and medication.  Pt may have ice chips for comfort and maintenance of swallow musculature after good oral care, in moderation, when fully awake/alert, with upright positioning and supervision.   SLP Visit Diagnosis: Dysphagia, unspecified (R13.10)    Aspiration Risk  Severe aspiration risk    Diet Recommendation NPO;Alternative means - temporary   Medication Administration: Via alternative means    Other  Recommendations Oral Care Recommendations: Oral care prior to ice chip/H20    Recommendations for follow up therapy are one component of a multi-disciplinary discharge planning process, led by the attending physician.  Recommendations may be updated based on patient status, additional functional criteria and insurance authorization.  Follow up Recommendations  (TBD)      Assistance Recommended at Discharge  N/A  Functional Status Assessment Patient has had a recent decline in their functional status and demonstrates the ability to make significant improvements in function in a reasonable and predictable amount of time.  Frequency and Duration min 2x/week  2 weeks       Prognosis Prognosis for improved oropharyngeal function: Good      Swallow Study   General HPI: Erica Banks is a 42 yo F with Etoh Cirrhosis/ascites and h/o hep encephalopathy d/c from [facility] 1/29 to GI clinic 2/2 am to schedule paracentesis but woke up confused > referred from GI to ED. Lethargic, risk of aspiration and need for rx with lactulose so electively intubated.  ETT 2/2-2/8. Per RD note husbad reports "pt has a very poor appetite at baseline. 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  Also  with history of IDA following with hematology with prescription iron infusions, GERD, PE, DVT, neuropathy. Type of Study: Bedside Swallow Evaluation Previous Swallow Assessment: none Diet Prior to this Study: NPO;Cortrak/Small bore NG tube Temperature Spikes Noted: No Respiratory Status: Nasal cannula History of Recent Intubation: Yes Total duration of intubation (days): 6 days Date extubated: 03/07/22 Behavior/Cognition: Alert;Cooperative;Requires cueing;Doesn't follow directions;Confused Oral Cavity Assessment: Within Functional Limits Oral Care Completed by SLP: No Oral Cavity - Dentition: Adequate natural dentition Self-Feeding Abilities: Needs assist Patient Positioning: Upright in bed Baseline Vocal Quality: Hoarse;Low vocal intensity;Breathy;Wet Volitional Cough: Weak Volitional Swallow: Able to elicit    Oral/Motor/Sensory Function Facial ROM: Within Functional Limits Facial Symmetry: Within Functional Limits Lingual ROM:  (Could not test) Lingual Symmetry:  (Could not test) Lingual Strength:  (Could not test) Velum:  (Could not test) Mandible: Within Functional Limits   Ice Chips Ice chips: Impaired Presentation: Spoon Pharyngeal Phase Impairments: Multiple swallows   Thin Liquid Thin Liquid: Impaired Presentation: Spoon Pharyngeal  Phase Impairments: Suspected delayed Swallow;Multiple swallows;Wet Vocal Quality;Throat Clearing - Immediate;Cough - Immediate    Nectar Thick Nectar Thick Liquid: Not tested   Honey Thick Honey Thick Liquid: Not tested   Puree Puree: Impaired Presentation: Spoon Pharyngeal Phase Impairments: Multiple swallows  Solid     Solid: Not tested      Celedonio Savage, [place], [place] Office: [phone] 03/08/2022,10:17 AM

## 2022-03-08 NOTE — Progress Notes (Addendum)
Brief Nutrition Support Note  Pt had cortrak tube placed this AM. Tip terminates post-pyloric in the descending duodenum. Pt with several instances of vomiting with gastric feeds.   Recommend initiation of the following regimen: Osmolite 1.5 @ 45m/h (13259md) Start at 25 and advance by 1065m6h to goal. Prosource TF 20 1x/d This will provide 2060kcal, 102g of protein, and 1006m23m free water  RachRanell Patrick, LDN Clinical Dietitian RD pager # available [facility] pager # available in [facility]

## 2022-03-08 NOTE — Progress Notes (Addendum)
Attending physician's note   I have reviewed the chart discussed her care on rounds.  Spoke with husband as well.  I performed a substantive portion of this encounter, including complete performance of at least one of the key components, in conjunction with the APP. I agree with the APP's note, impression, and recommendations with my edits.    Extubated yesterday.  Core track placed earlier today.  HRS resolved.  Hepatic function largely stable.  Anemia stable and no overt bleeding.  Anemia workup per primary CCS.  Inpatient GI service will sign off at this time.  Please do not hesitate to contact us with additional questions or concerns.  Leron Stoffers, DO, FACG [phone] office          Progress Note  Primary GI: Dr. Gala Romney    Subjective  Chief Complaint:Decompensated EtOH induced cirrhosis with severe hepatic encephalopathy requiring vent support   Patient with family at bedside, Husband. Provided some of the history.  Patient off ventilation, off sedation, able to respond to commands.  Having stool output. Patient with cortrak this AM, speech pathology has seen the patient.     Objective   Vital signs in last 24 hours: Temp:  [97.8 F (36.6 C)-99 F (37.2 C)] 99 F (37.2 C) (02/09 0759) Pulse Rate:  [63-191] 72 (02/09 1000) Resp:  [9-25] 25 (02/09 1000) BP: (104-133)/(56-86) 125/67 (02/09 1000) SpO2:  [100 %] 100 % (02/09 1000) FiO2 (%):  [30 %] 30 % (02/08 1800) Weight:  [66.8 kg] 66.8 kg (02/09 0500) Last BM Date : 03/08/22 Last BM recorded by nurses in past 5 days Stool Type: Type 7 (Liquid consistency with no solid pieces) (03/08/2022  8:00 AM)  General: Chronically ill-appearing female Heart:  Regular rate and rhythm; no murmurs Pulm: Clear anteriorly; no wheezing Abdomen:  Distended but decreased from the other day, soft abdomen, Active bowel sounds, non tender  Extremities:  with 1+ edema, muscle wasting Neurologic: following commands, diffuse  weakness but no focal deficit.   Intake/Output from previous day: 02/08 0701 - 02/09 0700 In: 350 [NG/GT:150; IV Piggyback:200] Out: 1300 [Urine:700; Stool:600] Intake/Output this shift: No intake/output data recorded.  Studies/Results: DG Abd Portable 1V  Result Date: 03/08/2022 CLINICAL DATA:  Enteric tube placement EXAM: PORTABLE ABDOMEN - 1 VIEW COMPARISON:  03/05/2022 abdominal radiograph FINDINGS: Weighted enteric tube tip is in the descending duodenum. No dilated small bowel loops. No evidence of pneumatosis or pneumoperitoneum in the visualized upper abdomen. Mild streaky bibasilar lung opacities. IMPRESSION: 1. Weighted enteric tube tip is in the descending duodenum. 2. Mild streaky bibasilar lung opacities, favor atelectasis. Electronically Signed   By: Ilona Sorrel M.D.   On: 03/08/2022 09:42    Lab Results: Recent Labs    03/06/22 0213 03/07/22 0138 03/08/22 0107  WBC 6.8 6.1 5.8  HGB 7.5* 7.2* 7.4*  HCT 23.0* 22.1* 23.2*  PLT 106* 108* 108*   BMET Recent Labs    03/06/22 0213 03/07/22 0138 03/08/22 0107  NA 142 145 149*  K 3.8 3.5 3.8  CL 109 112* 114*  CO2 22 25 24  $ GLUCOSE 148* 117* 111*  BUN 28* 28* 23*  CREATININE 0.74 0.66 0.53  CALCIUM 9.6 9.6 9.6   LFT No results for input(s): "PROT", "ALBUMIN", "AST", "ALT", "ALKPHOS", "BILITOT", "BILIDIR", "IBILI" in the last 72 hours.  PT/INR Recent Labs    03/07/22 0138 03/08/22 0107  LABPROT 21.1* 21.7*  INR 1.8* 1.9*     Scheduled Meds:  Chlorhexidine Gluconate Cloth  6 each Topical Q0600   ciprofloxacin  500 mg Oral Q breakfast   docusate  100 mg Per Tube BID   feeding supplement (PROSource TF20)  60 mL Per Tube Daily   folic acid  1 mg Per Tube Daily   furosemide  20 mg Per Tube Daily   heparin  5,000 Units Subcutaneous Q8H   lactulose  10 g Per Tube TID   multivitamin with minerals  1 tablet Per Tube Daily   mupirocin ointment  1 Application Nasal BID   pantoprazole (PROTONIX) IV  40 mg  Intravenous Q24H   polyethylene glycol  17 g Per Tube QID   rifaximin  550 mg Per Tube BID   spironolactone  50 mg Per Tube Daily   [START ON 03/09/2022] thiamine  100 mg Oral Daily   zinc sulfate  220 mg Per Tube Daily   Continuous Infusions:  sodium chloride Stopped (03/04/22 1519)   feeding supplement (OSMOLITE 1.5 CAL)        Patient profile:   42 year old female history of decompensated alcoholic cirrhosis with ascites, hepatic encephalopathy, esophageal varices and history of previous SBP, presented 03/01/2018 for severe hepatic encephalopathy,    Impression/Plan:   Decompensated cirrhosis secondary to ETOH MELD 3.0: 17 at 03/07/2022  1:38 AM MELD-Na: 17 at 03/07/2022  1:38 AM Calculated from: Serum Creatinine: 0.66 mg/dL (Using min of 1 mg/dL) at 03/07/2022  1:38 AM Serum Sodium: 145 mmol/L (Using max of 137 mmol/L) at 03/07/2022  1:38 AM Total Bilirubin: 2.5 mg/dL at 03/05/2022  9:30 AM Serum Albumin: 4.1 g/dL (Using max of 3.5 g/dL) at 03/05/2022  9:30 AM INR(ratio): 1.9 at 03/05/2022  6:54 AM Age at listing (hypothetical): 41 years Sex: Female at 03/07/2022  1:38 AM  MELD-Na: 17 (16 ) -Daily CBC, CMET, INR -Empiric treatment with folic acid, multivitamin and thiamine.  - patient off of ventilation, improved mentation, HRS resolved, ascites improving with diuretics, still guarded long term prognosis but patient has improved greatly from GI aspect.  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 Improved, off octreotride and midodine -Avoid large volume paracentesis, limit to 3 L when needed -first  dose lasix 20 mg and spironolactone 50 mg 03/06/22 - monitor kidney function closely  Ascites Recent paracentesis SBP negative 3 L paracentesis 02/05, can repeat as needed Continue Lasix 20 mg and spironolactone 50, monitor electrolytes and renal function, has had good response, can consider increasing to lasix 40 mg and spirolactone 10041mn next few days  Pharyngeal dysphagia status post 6 days intubation Severe malnution due to cirrhosis/vent Currently with core track Continue speech therapy  History of esophageal varices 12/22/2021 EGD with Dr.Castaneda grade 1-2 varices  Anemia chronic Recent Labs    02/15/22 0832 02/15/22 1659 03/01/22 1050 03/01/22 2252 03/02/22 0600 03/05/22 0654 03/05/22 0930 03/06/22 0213 03/07/22 0138 03/08/22 0107  HGB 7.8* 7.6* 9.2* 8.8* 8.0* 6.2* 6.3* 7.5* 7.2* 7.4*  No overt GI bleeding, monitor closely, slowly down trending  S/p 1 PRBC 03/05/22, with 6.2 tto 7.5 and stable at 7.4 On rocehpin Continue to monitor H/H and  transfuse as needed  Principal Problem:   Hepatic encephalopathy ([place]) Active Problems:   Acute hepatic encephalopathy ([facility])   Acute respiratory failure with hypoxemia ([facility])   Decompensated hepatic cirrhosis ([place])    LOS: 7 days   Vladimir Crofts  03/08/2022, 10:53 AM

## 2022-03-09 DIAGNOSIS — K7682 Hepatic encephalopathy: Secondary | ICD-10-CM | POA: Diagnosis not present

## 2022-03-09 LAB — CBC
HCT: 27.2 % — ABNORMAL LOW (ref 36.0–46.0)
Hemoglobin: 8.8 g/dL — ABNORMAL LOW (ref 12.0–15.0)
MCH: 30.2 pg (ref 26.0–34.0)
MCHC: 32.4 g/dL (ref 30.0–36.0)
MCV: 93.5 fL (ref 80.0–100.0)
Platelets: 116 10*3/uL — ABNORMAL LOW (ref 150–400)
RBC: 2.91 MIL/uL — ABNORMAL LOW (ref 3.87–5.11)
RDW: 18.6 % — ABNORMAL HIGH (ref 11.5–15.5)
WBC: 8.1 10*3/uL (ref 4.0–10.5)
nRBC: 0.2 % (ref 0.0–0.2)

## 2022-03-09 LAB — COMPREHENSIVE METABOLIC PANEL
ALT: 39 U/L (ref 0–44)
AST: 76 U/L — ABNORMAL HIGH (ref 15–41)
Albumin: 3.9 g/dL (ref 3.5–5.0)
Alkaline Phosphatase: 84 U/L (ref 38–126)
Anion gap: 10 (ref 5–15)
BUN: 24 mg/dL — ABNORMAL HIGH (ref 6–20)
CO2: 22 mmol/L (ref 22–32)
Calcium: 9.6 mg/dL (ref 8.9–10.3)
Chloride: 121 mmol/L — ABNORMAL HIGH (ref 98–111)
Creatinine, Ser: 0.79 mg/dL (ref 0.44–1.00)
GFR, Estimated: >60 mL/min (ref >60)
Glucose, Bld: 170 mg/dL — ABNORMAL HIGH (ref 70–99)
Potassium: 3.5 mmol/L (ref 3.5–5.1)
Sodium: 153 mmol/L — ABNORMAL HIGH (ref 135–145)
Total Bilirubin: 2.9 mg/dL — ABNORMAL HIGH (ref 0.3–1.2)
Total Protein: 6.1 g/dL — ABNORMAL LOW (ref 6.5–8.1)

## 2022-03-09 LAB — GLUCOSE, CAPILLARY
Glucose-Capillary: 131 mg/dL — ABNORMAL HIGH (ref 70–99)
Glucose-Capillary: 138 mg/dL — ABNORMAL HIGH (ref 70–99)
Glucose-Capillary: 138 mg/dL — ABNORMAL HIGH (ref 70–99)
Glucose-Capillary: 154 mg/dL — ABNORMAL HIGH (ref 70–99)
Glucose-Capillary: 156 mg/dL — ABNORMAL HIGH (ref 70–99)
Glucose-Capillary: 158 mg/dL — ABNORMAL HIGH (ref 70–99)
Glucose-Capillary: 180 mg/dL — ABNORMAL HIGH (ref 70–99)

## 2022-03-09 LAB — AMMONIA: Ammonia: 71 umol/L — ABNORMAL HIGH (ref 9–35)

## 2022-03-09 LAB — PROCALCITONIN: Procalcitonin: 0.1 ng/mL

## 2022-03-09 LAB — PROTIME-INR
INR: 2 — ABNORMAL HIGH (ref 0.8–1.2)
Prothrombin Time: 22.6 seconds — ABNORMAL HIGH (ref 11.4–15.2)

## 2022-03-09 MED ORDER — GUAIFENESIN 100 MG/5ML PO LIQD: 5.0000 mL | ORAL | Status: DC | PRN

## 2022-03-09 MED ORDER — FREE WATER
200.0000 mL | Status: DC
  Administered 2022-03-09 – 2022-03-11 (×12): 200 mL

## 2022-03-09 NOTE — Evaluation (Signed)
Occupational Therapy Evaluation Patient Details Name: Erica Banks MRN: [id number] DOB: 23-Jan-1981 Today's Date: 03/09/2022   History of Present Illness Erica Banks is a 42 y.o. female with medical history significant of chronic anemia of chronic disease, alcoholic cirrhosis, neuropathy, history of DVT/PE, and more presents with general malaise and fatigue -family reported increased confusion nausea vomiting and diarrhea. Patient had worsening encephalopathy in the ED and given profound lethargy and high risk for aspiration patient was intubated for airway protection. Of note patient was recently discharged from [facility] on 02/25/2022 for similar episode of encephalopathy secondary to cirrhosis and worsening ascites.  She was to follow-up with GI clinic on 03/01/2022 for repeat paracentesis- at the time of presentation to the GI facility given her confusion she was redirected to the ED.   Clinical Impression   Co eval with PT. Pt presents with decline in function and safety with ADLs and ADL mobility with impaired strength, B UE AROM, endurance, balance and coordination. Pt minimally verbal with flaccid extremities. PTA pt lived at home with her husband and son and family reports that pt was Ind with ADLs/selfcare and light cooking "on good days". Pt recently received a cane but hasn't used it. Pt currently requires total A +2 for bed mobility to sit EOB, total progressing to mod/max A for trunk/UB balance for sitting EOB approximately 6 minutes. Pt able to squeeze OTs hand on command 1/3 trials. Pt minimally verbal but able to shake head yes or no 50% of time when asked questions and to engage with her eyes. Currently;y recommending SNF for post acute rehab depending on medical status progression . OT will continue to follow acutely to maximize level of function and safety     Recommendations for follow up therapy are one component of a multi-disciplinary discharge planning process, led by the  attending physician.  Recommendations may be updated based on patient status, additional functional criteria and insurance authorization.   Follow Up Recommendations  Skilled nursing-short term rehab (<3 hours/day) (depending on progression of medical status)    Assistance Recommended at Discharge Frequent or constant Supervision/Assistance  Patient can return home with the following A lot of help with bathing/dressing/bathroom;Two people to help with walking and/or transfers;Direct supervision/assist for medications management;Assistance with cooking/housework;Assist for transportation    Functional Status Assessment  Patient has had a recent decline in their functional status and demonstrates the ability to make significant improvements in function in a reasonable and predictable amount of time.  Equipment Recommendations  Other (comment) (TBD at next venue of care)    Recommendations for Other Services       Precautions / Restrictions Precautions Precautions: Fall Precaution Comments: pt with flaccid etremities, Rectal Tube Restrictions Weight Bearing Restrictions: No      Mobility Bed Mobility Overal bed mobility: Needs Assistance Bed Mobility: Supine to Sit, Sit to Supine     Supine to sit: Total assist, +2 for physical assistance Sit to supine: Total assist, +2 for physical assistance        Transfers                   General transfer comment: unable to safelt transfer pt at this time, will need mechanical lift      Balance Overall balance assessment: Needs assistance Sitting-balance support: Bilateral upper extremity supported, Feet supported Sitting balance-Leahy Scale: Zero Sitting balance - Comments: pt with flaccid extremities, unable to self correct sitting posture or maintain trunk/UB balance.pt required total A progressing to  mod A to maintain turnk balance sitting EOB. pt leaning to R side Postural control: Right lateral lean, Posterior lean      Standing balance comment: unable                           ADL either performed or assessed with clinical judgement   ADL Overall ADL's : Needs assistance/impaired Eating/Feeding: NPO   Grooming: Total assistance   Upper Body Bathing: Total assistance   Lower Body Bathing: Total assistance   Upper Body Dressing : Total assistance   Lower Body Dressing: Total assistance   Toilet Transfer: Total assistance Toilet Transfer Details (indicate cue type and reason): unable, will require mechanical lift Toileting- Clothing Manipulation and Hygiene: Total assistance       Functional mobility during ADLs: Total assistance;+2 for physical assistance;+2 for safety/equipment General ADL Comments: pt with flaccid extremities, unable to self correct sitting posture or maintain trunk/UB balance. pt required total A progressing to mod A to maintain turnk balance sitting EOB. pt leaning to R side     Vision Patient Visual Report: No change from baseline       Perception     Praxis      Pertinent Vitals/Pain       Hand Dominance Right   Extremity/Trunk Assessment Upper Extremity Assessment Upper Extremity Assessment: Generalized weakness;RUE deficits/detail;LUE deficits/detail RUE Deficits / Details: flaccid. hand felxion on command 1/3 trials LUE Deficits / Details: flaccid, hand flexion on command 1/3 trials   Lower Extremity Assessment Lower Extremity Assessment: Defer to PT evaluation   Cervical / Trunk Assessment Cervical / Trunk Assessment:  (pt required total A progressing to mod A to maintain turnk balance sitting EOB. pt leaning to R side)   Communication Communication Communication: Expressive difficulties;Other (comment) (pt able to shake head yes and no, minimally verbal)   Cognition Arousal/Alertness: Awake/alert   Overall Cognitive Status: Impaired/Different from baseline Area of Impairment: Following commands                        Following Commands: Follows one step commands inconsistently, Follows one step commands with increased time       General Comments: pt able to squeeze OT's hand on commancd 1/3 trials     General Comments       Exercises     Shoulder Instructions      Home Living Family/patient expects to be discharged to:: Private residence Living Arrangements: Spouse/significant other;Children Available Help at Discharge: Family;Available PRN/intermittently Type of Home: House Home Access: Stairs to enter [employer] of Steps: 4 Entrance Stairs-Rails: Can reach both Home Layout: One level     Bathroom Shower/Tub: Teacher, early years/pre: Standard Bathroom Accessibility: Yes   Home Equipment: Cane - single point   Additional Comments: cane had been ordered and received, however pt had not a chance to use it per her sister's report      Prior Functioning/Environment Prior Level of Function : Independent/Modified Independent             Mobility Comments: without AD ADLs Comments: Independent. Pt's sister and daughter report that on good days, pt was Ind with ADLs/selfcare and mobility. Required assist when not having good days and slept alot (poor sleep during the night at time)        OT Problem List: Decreased strength;Decreased activity tolerance;Decreased range of motion;Decreased cognition;Decreased coordination;Impaired balance (sitting and/or standing)  OT Treatment/Interventions: Self-care/ADL training;Therapeutic exercise;Balance training;DME and/or AE instruction;Neuromuscular education;Therapeutic activities;Patient/family education    OT Goals(Current goals can be found in the care plan section) Acute Rehab OT Goals Patient Stated Goal: family did not state specifically OT Goal Formulation: With patient/family Time For Goal Achievement: 03/23/22 Potential to Achieve Goals: Fair ADL Goals Pt Will Perform Grooming: with max assist;with  mod assist;sitting Additional ADL Goal #1: Pt will tolerate sitting EOB x 5-10 minutes with mod - min A for balance/support during grooming tasks Additional ADL Goal #2: Pt will tolerate AAROM of B UEs in all planes to decrease risk of joint pain and stiffness  OT Frequency: Min 2X/week    Co-evaluation PT/OT/SLP Co-Evaluation/Treatment: Yes Reason for Co-Treatment: Complexity of the patient's impairments (multi-system involvement);For patient/therapist safety   OT goals addressed during session: ADL's and self-care;Strengthening/ROM      AM-PAC OT "6 Clicks" Daily Activity     Outcome Measure Help from another person eating meals?: Total Help from another person taking care of personal grooming?: Total Help from another person toileting, which includes using toliet, bedpan, or urinal?: Total Help from another person bathing (including washing, rinsing, drying)?: Total Help from another person to put on and taking off regular upper body clothing?: Total Help from another person to put on and taking off regular lower body clothing?: Total 6 Click Score: 6   End of Session    Activity Tolerance: Patient limited by lethargy Patient left: in bed;with call bell/phone within reach;with bed alarm set;with family/visitor present  OT Visit Diagnosis: Other abnormalities of gait and mobility (R26.89);Muscle weakness (generalized) (M62.81);Other symptoms and signs involving cognitive function                Time: [id number] OT Time Calculation (min): 29 min Charges:  OT General Charges $OT Visit: 1 Visit OT Evaluation $OT Eval Moderate Complexity: 1 Mod   Britt Bottom 03/09/2022, 1:49 PM

## 2022-03-09 NOTE — Plan of Care (Signed)
Pt awake and alert all shift. Non-verbal. Minimal with following commands. All extremities flaccid

## 2022-03-09 NOTE — Evaluation (Addendum)
Physical Therapy Evaluation Patient Details Name: Erica Banks MRN: [id number] DOB: 08-Nov-1980 Today's Date: 03/09/2022  History of Present Illness  Erica Banks is a 42 y.o. 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  Intubated for airway protection on 03/01/22 until 03/07/22. Patient was recently discharged from [facility] on 02/25/2022 for similar episode of encephalopathy secondary to cirrhosis and worsening ascites. Pt with PHM significant for but not limited to:  chronic anemia of chronic disease, alcoholic cirrhosis, neuropathy, esophageal varices, history of DVT/PE.  Clinical Impression  Pt admitted with above diagnosis.  Pt currently with functional limitations due to the deficits listed below (see PT Problem List). Pt will benefit from skilled PT to increase their independence and safety with mobility to allow discharge to the venue listed below.  Pt is total care currently.  Unsure of what her medical progression may be makes it difficult to prognosticate how she will be able to progress with rehab.  At this time recommend SNF and will continue to follow and update goals and DC plan as we see how pt progresses.  Answered family's questions at this time.            Recommendations for follow up therapy are one component of a multi-disciplinary discharge planning process, led by the attending physician.  Recommendations may be updated based on patient status, additional functional criteria and insurance authorization.  Follow Up Recommendations Skilled nursing-short term rehab (<3 hours/day) Can patient physically be transported by private vehicle: No    Assistance Recommended at Discharge Frequent or constant Supervision/Assistance  Patient can return home with the following  A lot of help with walking and/or transfers;A lot of help with bathing/dressing/bathroom;Assistance with cooking/housework;Assistance with feeding;Direct supervision/assist for  medications management;Direct supervision/assist for financial management;Assist for transportation;Help with stairs or ramp for entrance    Equipment Recommendations Rolling walker (2 wheels);Wheelchair (measurements PT);Wheelchair cushion (measurements PT)  Recommendations for Other Services       Functional Status Assessment Patient has had a recent decline in their functional status and demonstrates the ability to make significant improvements in function in a reasonable and predictable amount of time.     Precautions / Restrictions Precautions Precautions: Fall Precaution Comments: pt with flaccid etremities, Rectal Tube Restrictions Weight Bearing Restrictions: No      Mobility  Bed Mobility Overal bed mobility: Needs Assistance Bed Mobility: Supine to Sit, Sit to Supine     Supine to sit: Total assist, +2 for physical assistance Sit to supine: Total assist, +2 for physical assistance   General bed mobility comments: pt did not really assist at all with bed mobility    Transfers                   General transfer comment: Unable to safely attempt today    Ambulation/Gait                  Stairs            Wheelchair Mobility    Modified Rankin (Stroke Patients Only)       Balance Overall balance assessment: Needs assistance Sitting-balance support: Bilateral upper extremity supported, Feet supported Sitting balance-Leahy Scale: Zero Sitting balance - Comments: pt with flaccid extremities, unable to self correct sitting posture or maintain trunk/UB balance.pt required total A progressing to mod A to maintain turnk balance sitting EOB. pt leaning to R side Postural control: Posterior lean, Right lateral lean, Left lateral lean  Pertinent Vitals/Pain Pain Assessment Pain Assessment: Faces Faces Pain Scale: No hurt Facial Expression: Relaxed, neutral Body Movements: Absence of  movements Muscle Tension: Relaxed Compliance with ventilator (intubated pts.): N/A Vocalization (extubated pts.): N/A CPOT Total: 0    Home Living Family/patient expects to be discharged to:: Private residence Living Arrangements: Spouse/significant other;Children Available Help at Discharge: Family;Available PRN/intermittently Type of Home: House Home Access: Stairs to enter Entrance Stairs-Rails: Can reach both Entrance Stairs-Number of Steps: 4   Home Layout: One level Home Equipment: Cane - single point Additional Comments: cane had been ordered and received, however pt had not a chance to use it per her sister's report    Prior Function Prior Level of Function : Independent/Modified Independent             Mobility Comments: without AD ADLs Comments: Independent. Pt's sister and daughter report that on good days, pt was Ind with ADLs/selfcare and mobility. Required assist when not having good days and slept alot (poor sleep during the night at time)     Hand Dominance   Dominant Hand: Right    Extremity/Trunk Assessment   Upper Extremity Assessment Upper Extremity Assessment: Defer to OT evaluation RUE Deficits / Details: flaccid. hand felxion on command 1/3 trials LUE Deficits / Details: flaccid, hand flexion on command 1/3 trials    Lower Extremity Assessment Lower Extremity Assessment: Generalized weakness (Pt had occassional movements to LE's, occassionally on command)    Cervical / Trunk Assessment Cervical / Trunk Assessment: Normal  Communication   Communication: Expressive difficulties  Cognition Arousal/Alertness: Awake/alert Behavior During Therapy: WFL for tasks assessed/performed Overall Cognitive Status: Impaired/Different from baseline Area of Impairment: Following commands                       Following Commands: Follows one step commands inconsistently, Follows one step commands with increased time                General  Comments General comments (skin integrity, edema, etc.): Pt daughter and sister present in room throughout session. Sister reports performing PROM on LEs daily when pt was in ICU    Exercises     Assessment/Plan    PT Assessment Patient needs continued PT services  PT Problem List Decreased strength;Decreased balance;Decreased mobility;Decreased knowledge of use of DME;Decreased safety awareness       PT Treatment Interventions DME instruction;Gait training;Therapeutic activities;Therapeutic exercise;Balance training;Neuromuscular re-education;Patient/family education    PT Goals (Current goals can be found in the Care Plan section)  Acute Rehab PT Goals Patient Stated Goal: Pt unable to state PT Goal Formulation: Patient unable to participate in goal setting Time For Goal Achievement: 03/23/22 Potential to Achieve Goals: Fair    Frequency Min 3X/week     Co-evaluation   Reason for Co-Treatment: Complexity of the patient's impairments (multi-system involvement);For patient/therapist safety   OT goals addressed during session: ADL's and self-care;Strengthening/ROM       AM-PAC PT "6 Clicks" Mobility  Outcome Measure Help needed turning from your back to your side while in a flat bed without using bedrails?: Total Help needed moving from lying on your back to sitting on the side of a flat bed without using bedrails?: Total Help needed moving to and from a bed to a chair (including a wheelchair)?: Total Help needed standing up from a chair using your arms (e.g., wheelchair or bedside chair)?: Total Help needed to walk in hospital room?: Total Help needed climbing 3-5 steps with  a railing? : Total 6 Click Score: 6    End of Session   Activity Tolerance: Patient tolerated treatment well Patient left: in bed;with family/visitor present;with bed alarm set   PT Visit Diagnosis: Muscle weakness (generalized) (M62.81)    Time: [id number] PT Time Calculation (min) (ACUTE  ONLY): 29 min   Charges:   PT Evaluation $PT Eval Moderate Complexity: [place], PT   Acute Rehabilitation Services  Office [phone] 03/09/2022   Melvern Banker 03/09/2022, 3:16 PM

## 2022-03-09 NOTE — Progress Notes (Signed)
PROGRESS NOTE    Erica Banks  [id number] DOB: Dec 04, 1980 DOA: 03/01/2022 PCP: Neale Burly, MD   Brief Narrative:  Erica Banks is a 42 y.o. female with medical history significant of chronic anemia of chronic disease, alcoholic cirrhosis, neuropathy, history of DVT/PE, and more presents ED with a chief complaint of general malaise and fatigue -family reports increased confusion nausea vomiting and diarrhea.  History was somewhat difficult to obtain at intake and patient had worsening encephalopathy in the ED and given profound lethargy and high risk for aspiration patient was intubated for airway protection.  Of note patient was recently discharged from [facility] on 02/25/2022 for similar episode of encephalopathy secondary to cirrhosis and worsening ascites.  She was to follow-up with GI clinic on 03/01/2022 for repeat paracentesis- at the time of presentation to the GI facility given her confusion she was redirected to the ED.  History obtained per family indicates patient had been noncompliant with lactulose for at least 2 days due to her GI complaints of nausea vomiting and diarrhea.  Assessment & Plan:   Principal Problem:   Hepatic encephalopathy ([place]) Active Problems:   Acute hepatic encephalopathy (HCC)   Acute respiratory failure with hypoxemia (HCC)   Decompensated hepatic cirrhosis (HCC)   Urinary tract infection due to ESBL Klebsiella   Protein-calorie malnutrition, severe   Acute on chronic hepatic encephalopathy and hyperammonemia Acute on chronic decompensated liver cirrhosis secondary to alcohol use -Repeat ammonia level as indicated, adjust rifaximin and lactulose appropriately -Most recent paracentesis 03/04/2022 -3 L withdrawn without complication -Continues on ciprofloxacin for SBP prophylaxis  -Continues Lasix, spironolactone -poor p.o. intake on NG tube, adjust diuretics appropriately for p.o. intake -Continue vitamin supplements, thiamine multivitamin and  zinc  Severe Sepsis due to ESBL Klebsiella UTI with AKI Completed a ertapenem 03/07/2022   AKI without history of CKD resolved -Likely in the setting of nausea vomiting diarrhea and hypovolemia  -Exacerbated by chronic diuretics -resumed now that her creatinine has resolved back to baseline(WNL)  Profound aphasia and dysphagia  Moderate to severe protein caloric malnutrition -Continue TF through cortrak -Advance diet as tolerated once cleared by speech -May benefit from direct visualization of vocal cords. Patient is not making effort to speak today, it is not that she is hoarse or quiet or having difficulty phonating - it appears to be that she is not even attempting to speak.   Acute hypoxemic respiratory failure requiring intubation mechanical ventilation, resolved Extubated 03/06/2022 -currently on room air without hypoxia  Hypovolemic hypernatremia  - 3L deficit - increase flushes 27m q4h until p.o. intake improves  DVT prophylaxis: Heparin Code Status: Full Family Communication: At bedside  Status is: Inpatient  Dispo: The patient is from: Home              Anticipated d/c is to: To be determined              Anticipated d/c date is: To be determined              Patient currently not medically stable for discharge  Consultants:  PCCM  Procedures:  Intubation/extubation as above Paracentesis  Antimicrobials:  Ciprofloxacin daily  Subjective: No acute issues or events overnight, patient's interactions today at bedside remain poor, she will mimic movements but does not respond appropriately to commands or questions, she does not attempt to phonate or interact in any meaningful way.  Review of systems markedly limited  Objective: Vitals:   03/08/22 1746 03/08/22 2013 03/08/22  2337 03/09/22 0410  BP: 130/76 135/73 134/72 135/67  Pulse: 97 (!) 103 (!) 109 (!) 106  Resp: 18     Temp: 99.5 F (37.5 C) 100 F (37.8 C) 99.8 F (37.7 C) 98.4 F (36.9 C)  TempSrc:  Oral Oral Oral Oral  SpO2: 95% 95% 96% 96%  Weight:      Height:        Intake/Output Summary (Last 24 hours) at 03/09/2022 0759 Last data filed at 03/09/2022 0544 Gross per 24 hour  Intake 898 ml  Output 485 ml  Net 413 ml   Filed Weights   03/06/22 0333 03/07/22 0500 03/08/22 0500  Weight: 66.5 kg 67.3 kg 66.8 kg    Examination:  General: Awake, alert, resting comfortably in bed, no acute distress HEENT: Normocephalic, noted icteric sclera bilaterally, left medial conjunctival hemorrhage noted Neck:  Without mass or deformity.  Trachea is midline. Lungs: Without overt wheezes rales or rhonchi Heart:  Regular rate and rhythm.  Without murmurs, rubs, or gallops. Abdomen: Soft, distended, without obvious guarding Extremities: Without cyanosis, clubbing, edema, or obvious deformity. Skin: Jaundiced, warm and dry, no erythema/rashes noted.  Data Reviewed: I have personally reviewed following labs and imaging studies  CBC: Recent Labs  Lab 03/05/22 0930 03/06/22 0213 03/07/22 0138 03/08/22 0107 03/09/22 0257  WBC 6.6 6.8 6.1 5.8 8.1  HGB 6.3* 7.5* 7.2* 7.4* 8.8*  HCT 19.4* 23.0* 22.1* 23.2* 27.2*  MCV 91.9 90.9 90.6 93.2 93.5  PLT 105* 106* 108* 108* [id number]*   Basic Metabolic Panel: Recent Labs  Lab 03/03/22 0556 03/03/22 1632 03/04/22 0050 03/04/22 1658 03/05/22 0654 03/06/22 0213 03/07/22 0138 03/08/22 0107 03/09/22 0257  NA  --  135 135 135 138 142 145 149* 153*  K  --  3.9 3.4* 3.8 3.9 3.8 3.5 3.8 3.5  CL  --  106 107 108 108 109 112* 114* 121*  CO2  --  19* 19* 17* 18* 22 25 24 22  $ GLUCOSE  --  136* 133* 123* 150* 148* 117* 111* 170*  BUN  --  29* 31* 31* 31* 28* 28* 23* 24*  CREATININE  --  1.86* 1.74* 1.18* 0.90 0.74 0.66 0.53 0.79  CALCIUM  --  8.9 9.0 9.3 9.5 9.6 9.6 9.6 9.6  MG 2.4 2.4 2.4 2.4 2.5*  --   --   --   --   PHOS 7.5* 6.8* 5.7* 3.4 2.7  --   --   --   --    GFR: Estimated Creatinine Clearance: 87.5 mL/min (by C-G formula based on SCr of  0.79 mg/dL). Liver Function Tests: Recent Labs  Lab 03/04/22 0050 03/05/22 0930 03/09/22 0257  AST 73* 58* 76*  ALT 32 28 39  ALKPHOS 78 76 84  BILITOT 1.6* 2.5* 2.9*  PROT 6.1* 5.9* 6.1*  ALBUMIN 4.4 4.1 3.9   No results for input(s): "LIPASE", "AMYLASE" in the last 168 hours. Recent Labs  Lab 03/02/22 1041 03/04/22 0050 03/05/22 1312 03/07/22 0138  AMMONIA 133* 133* 162* 41*   Coagulation Profile: Recent Labs  Lab 03/05/22 0654 03/06/22 0213 03/07/22 0138 03/08/22 0107 03/09/22 0257  INR 1.9* 1.8* 1.8* 1.9* 2.0*   CBG: Recent Labs  Lab 03/08/22 0747 03/08/22 1622 03/08/22 2011 03/09/22 0138 03/09/22 0411  GLUCAP 104* 128* 144* 154* 158*   Sepsis Labs: Recent Labs  Lab 03/09/22 0257  PROCALCITON 0.10    Recent Results (from the past 240 hour(s))  Body fluid culture w Gram Stain  Status: None   Collection Time: 03/01/22  1:45 PM   Specimen: Ascitic; Peritoneal Fluid  Result Value Ref Range Status   Specimen Description   Final    ASCITIC Performed at [facility], [address] [place], [place] [address]    Special Requests   Final    NONE Performed at [facility], [address] [place], [place] [address]    Gram Stain   Final    CYTOSPIN SMEAR WBC PRESENT,BOTH PMN AND MONONUCLEAR NO ORGANISMS SEEN    Culture   Final    NO GROWTH 3 DAYS Performed at [facility], [place] [address] [place], [place] [address]    Report Status 03/04/2022 FINAL  Final  Culture, blood (Routine X 2) w Reflex to ID Panel     Status: None   Collection Time: 03/01/22  4:48 PM   Specimen: BLOOD  Result Value Ref Range Status   Specimen Description BLOOD BLOOD RIGHT ARM  Final   Special Requests NONE  Final   Culture   Final    NO GROWTH 6 DAYS Performed at [facility], [address], [place], [place] [address]    Report Status 03/07/2022 FINAL  Final  Culture, blood (Routine X 2) w Reflex to ID Panel     Status: None   Collection Time: 03/01/22   4:48 PM   Specimen: BLOOD  Result Value Ref Range Status   Specimen Description BLOOD BLOOD LEFT HAND  Final   Special Requests NONE  Final   Culture   Final    NO GROWTH 6 DAYS Performed at [facility], [address], [place], [place] [address]    Report Status 03/07/2022 FINAL  Final  MRSA Next Gen by PCR, Nasal     Status: None   Collection Time: 03/01/22  8:38 PM   Specimen: Nasal Mucosa; Nasal Swab  Result Value Ref Range Status   MRSA by PCR Next Gen NOT DETECTED NOT DETECTED Final    Comment: (NOTE) The GeneXpert MRSA Assay (FDA approved for NASAL specimens only), is one component of a comprehensive MRSA colonization surveillance program. It is not intended to diagnose MRSA infection nor to guide or monitor treatment for MRSA infections. Test performance is not FDA approved in patients less than 55 years old. Performed at [facility], [place] [address], [place], [place] [address]   Body fluid culture w Gram Stain     Status: None   Collection Time: 03/02/22 12:00 AM   Specimen: Peritoneal Washings; Body Fluid  Result Value Ref Range Status   Specimen Description PERITONEAL FLUID  Final   Special Requests NONE  Final   Gram Stain   Final    CYTOSPIN SMEAR WBC PRESENT,BOTH PMN AND MONONUCLEAR NO ORGANISMS SEEN    Culture   Final    NO GROWTH 3 DAYS Performed at [facility], [address], [place], [place] [address]    Report Status 03/05/2022 FINAL  Final  Urine Culture     Status: Abnormal   Collection Time: 03/02/22  1:50 AM   Specimen: Urine, Random  Result Value Ref Range Status   Specimen Description URINE, RANDOM  Final   Special Requests   Final    NONE Reflexed from [id number] Performed at [facility], [place] [address] [place], [place] [address]    Culture (A)  Final    >=100,000 COLONIES/mL KLEBSIELLA PNEUMONIAE Confirmed Extended Spectrum Beta-Lactamase Producer (ESBL).  In bloodstream  infections from ESBL organisms, carbapenems are  preferred over piperacillin/tazobactam. They are shown to have a lower risk of mortality.    Report Status 03/04/2022 FINAL  Final   Organism ID, Bacteria KLEBSIELLA PNEUMONIAE (A)  Final      Susceptibility   Klebsiella pneumoniae - MIC*    AMPICILLIN >=32 RESISTANT Resistant     CEFAZOLIN >=64 RESISTANT Resistant     CEFEPIME >=32 RESISTANT Resistant     CEFTRIAXONE >=64 RESISTANT Resistant     CIPROFLOXACIN 2 RESISTANT Resistant     GENTAMICIN >=16 RESISTANT Resistant     IMIPENEM <=0.25 SENSITIVE Sensitive     NITROFURANTOIN 64 INTERMEDIATE Intermediate     TRIMETH/SULFA >=320 RESISTANT Resistant     AMPICILLIN/SULBACTAM >=32 RESISTANT Resistant     PIP/TAZO 16 SENSITIVE Sensitive     * >=100,000 COLONIES/mL KLEBSIELLA PNEUMONIAE  Surgical PCR screen     Status: None   Collection Time: 03/08/22  8:22 AM   Specimen: Nasal Mucosa; Nasal Swab  Result Value Ref Range Status   MRSA, PCR NEGATIVE NEGATIVE Final   Staphylococcus aureus NEGATIVE NEGATIVE Final    Comment: (NOTE) The Xpert SA Assay (FDA approved for NASAL specimens in patients 17 years of age and older), is one component of a comprehensive surveillance program. It is not intended to diagnose infection nor to guide or monitor treatment. Performed at [facility], [place] [address] [place], [place] [address]          Radiology Studies: DG Abd Portable 1V  Result Date: 03/08/2022 CLINICAL DATA:  Enteric tube placement EXAM: PORTABLE ABDOMEN - 1 VIEW COMPARISON:  03/05/2022 abdominal radiograph FINDINGS: Weighted enteric tube tip is in the descending duodenum. No dilated small bowel loops. No evidence of pneumatosis or pneumoperitoneum in the visualized upper abdomen. Mild streaky bibasilar lung opacities. IMPRESSION: 1. Weighted enteric tube tip is in the descending duodenum. 2. Mild streaky bibasilar lung opacities, favor atelectasis. Electronically Signed   By: Ilona Sorrel M.D.   On: 03/08/2022 09:42     Scheduled Meds:  ciprofloxacin  500 mg Per Tube Q breakfast   docusate  100 mg Per Tube BID   feeding supplement (PROSource TF20)  60 mL Per Tube Daily   folic acid  1 mg Per Tube Daily   free water  100 mL Per Tube Q6H   furosemide  20 mg Per Tube Daily   heparin  5,000 Units Subcutaneous Q8H   lactulose  10 g Per Tube TID   multivitamin with minerals  1 tablet Per Tube Daily   mupirocin ointment  1 Application Nasal BID   pantoprazole (PROTONIX) IV  40 mg Intravenous Q24H   rifaximin  550 mg Per Tube BID   spironolactone  50 mg Per Tube Daily   thiamine  100 mg Oral Daily   zinc sulfate  220 mg Per Tube Daily   Continuous Infusions:  sodium chloride Stopped (03/04/22 1519)   feeding supplement (OSMOLITE 1.5 CAL) 1,000 mL (03/08/22 1216)     LOS: 8 days   Time spent: 21mn  Chrles Selley C Clarece Drzewiecki, DO [facility]  If 7PM-7AM, please contact night-coverage [email or web address]  03/09/2022, 7:59 AM

## 2022-03-10 ENCOUNTER — Inpatient Hospital Stay (HOSPITAL_COMMUNITY): Payer: Medicaid Other

## 2022-03-10 DIAGNOSIS — K7682 Hepatic encephalopathy: Secondary | ICD-10-CM | POA: Diagnosis not present

## 2022-03-10 LAB — CBC
HCT: 28 % — ABNORMAL LOW (ref 36.0–46.0)
Hemoglobin: 8.3 g/dL — ABNORMAL LOW (ref 12.0–15.0)
MCH: 28.8 pg (ref 26.0–34.0)
MCHC: 29.6 g/dL — ABNORMAL LOW (ref 30.0–36.0)
MCV: 97.2 fL (ref 80.0–100.0)
Platelets: 103 10*3/uL — ABNORMAL LOW (ref 150–400)
RBC: 2.88 MIL/uL — ABNORMAL LOW (ref 3.87–5.11)
RDW: 18.9 % — ABNORMAL HIGH (ref 11.5–15.5)
WBC: 10 10*3/uL (ref 4.0–10.5)
nRBC: 0.2 % (ref 0.0–0.2)

## 2022-03-10 LAB — COMPREHENSIVE METABOLIC PANEL
ALT: 47 U/L — ABNORMAL HIGH (ref 0–44)
AST: 94 U/L — ABNORMAL HIGH (ref 15–41)
Albumin: 3.5 g/dL (ref 3.5–5.0)
Alkaline Phosphatase: 79 U/L (ref 38–126)
Anion gap: 8 (ref 5–15)
BUN: 24 mg/dL — ABNORMAL HIGH (ref 6–20)
CO2: 22 mmol/L (ref 22–32)
Calcium: 8.8 mg/dL — ABNORMAL LOW (ref 8.9–10.3)
Chloride: 126 mmol/L — ABNORMAL HIGH (ref 98–111)
Creatinine, Ser: 0.88 mg/dL (ref 0.44–1.00)
GFR, Estimated: >60 mL/min (ref >60)
Glucose, Bld: 178 mg/dL — ABNORMAL HIGH (ref 70–99)
Potassium: 3.9 mmol/L (ref 3.5–5.1)
Sodium: 156 mmol/L — ABNORMAL HIGH (ref 135–145)
Total Bilirubin: 3.1 mg/dL — ABNORMAL HIGH (ref 0.3–1.2)
Total Protein: 5.7 g/dL — ABNORMAL LOW (ref 6.5–8.1)

## 2022-03-10 LAB — GLUCOSE, CAPILLARY
Glucose-Capillary: 158 mg/dL — ABNORMAL HIGH (ref 70–99)
Glucose-Capillary: 149 mg/dL — ABNORMAL HIGH (ref 70–99)
Glucose-Capillary: 163 mg/dL — ABNORMAL HIGH (ref 70–99)
Glucose-Capillary: 151 mg/dL — ABNORMAL HIGH (ref 70–99)
Glucose-Capillary: 153 mg/dL — ABNORMAL HIGH (ref 70–99)

## 2022-03-10 LAB — PROTIME-INR
INR: 2.3 — ABNORMAL HIGH (ref 0.8–1.2)
Prothrombin Time: 24.8 seconds — ABNORMAL HIGH (ref 11.4–15.2)

## 2022-03-10 LAB — AMMONIA: Ammonia: 67 umol/L — ABNORMAL HIGH (ref 9–35)

## 2022-03-10 MED ORDER — GUAIFENESIN 100 MG/5ML PO LIQD
10.0000 mL | ORAL | Status: AC
  Administered 2022-03-10 (×3): 10 mL via ORAL
  Filled 2022-03-10 (×3): qty 10

## 2022-03-10 MED ORDER — GUAIFENESIN 100 MG/5ML PO LIQD: 10.0000 mL | ORAL | Status: DC | PRN

## 2022-03-10 MED ORDER — ACETAMINOPHEN 160 MG/5ML PO SOLN
650.0000 mg | Freq: Once | ORAL | Status: AC
  Administered 2022-03-10: 650 mg via ORAL
  Filled 2022-03-10: qty 20.3

## 2022-03-10 MED ORDER — FREE WATER
200.0000 mL | Freq: Once | Status: AC
  Administered 2022-03-10: 200 mL

## 2022-03-10 NOTE — Progress Notes (Signed)
Was asleep when I came into rm. Last night she didn't close her eyes all night.

## 2022-03-10 NOTE — Progress Notes (Signed)
PROGRESS NOTE    Erica Banks  [id number] DOB: [birth year] DOA: 03/01/2022 PCP: Neale Burly, MD   Brief Narrative:  Erica Banks is a 42 y.o. female with medical history significant of chronic anemia of chronic disease, alcoholic cirrhosis, neuropathy, history of DVT/PE, and more presents ED with a chief complaint of general malaise and fatigue -family reports increased confusion nausea vomiting and diarrhea.  History was somewhat difficult to obtain at intake and patient had worsening encephalopathy in the ED and given profound lethargy and high risk for aspiration patient was intubated for airway protection.  Of note patient was recently discharged from [facility] on 02/25/2022 for similar episode of encephalopathy secondary to cirrhosis and worsening ascites.  She was to follow-up with GI clinic on 03/01/2022 for repeat paracentesis- at the time of presentation to the GI facility given her confusion she was redirected to the ED. History obtained per family indicates patient had been noncompliant with lactulose for at least 2 days due to her GI complaints of nausea vomiting and diarrhea.  Assessment & Plan:   Principal Problem:   Hepatic encephalopathy ([place]) Active Problems:   Acute hepatic encephalopathy (HCC)   Acute respiratory failure with hypoxemia (HCC)   Decompensated hepatic cirrhosis (HCC)   Urinary tract infection due to ESBL Klebsiella   Protein-calorie malnutrition, severe   Acute on chronic hepatic encephalopathy and hyperammonemia Acute on chronic decompensated liver cirrhosis secondary to alcohol use -Repeat ammonia level improving, adjust rifaximin and lactulose appropriately -Most recent paracentesis 03/04/2022 -3 L withdrawn without complication -Continues on ciprofloxacin for SBP prophylaxis  -Continues Lasix, spironolactone -poor p.o. intake on NG tube, adjust diuretics appropriately for p.o. intake -Continue vitamin supplements, thiamine multivitamin and  zinc  Severe Sepsis due to ESBL Klebsiella UTI with AKI Completed a ertapenem 03/07/2022   AKI without history of CKD resolved -Likely in the setting of nausea vomiting diarrhea and hypovolemia  -Exacerbated by chronic diuretics -resumed now that her creatinine has resolved back to baseline(WNL)  Profound aphasia and dysphagia  Moderate to severe protein caloric malnutrition -Continue TF through cortrak -Advance diet as tolerated once cleared by speech -May benefit from direct visualization of vocal cords if not improving -Patient able to phonate today, weak but audible.  Short one-word sentences.   Acute hypoxemic respiratory failure requiring intubation mechanical ventilation, resolved Extubated 03/06/2022 -currently on room air without hypoxia  Hypovolemic hypernatremia  - 3L deficit - increase flushes 243m q4h until p.o. intake improves  DVT prophylaxis: Heparin Code Status: Full Family Communication: At bedside  Status is: Inpatient  Dispo: The patient is from: Home              Anticipated d/c is to: To be determined              Anticipated d/c date is: To be determined              Patient currently not medically stable for discharge  Consultants:  PCCM  Procedures:  Intubation/extubation as above Paracentesis  Antimicrobials:  Ciprofloxacin daily  Subjective: No acute issues or events overnight, patient's interactions today at bedside improving but still limited/slow to respond. Review of systems markedly limited  Objective: Vitals:   03/09/22 2358 03/10/22 0010 03/10/22 0135 03/10/22 0347  BP:   136/71 136/72  Pulse:   (!) 109 (!) 110  Resp: (!) 22 (!) 27 16 16  $ Temp:    99 F (37.2 C)  TempSrc:      SpO2:  94% 97%  Weight:      Height:        Intake/Output Summary (Last 24 hours) at 03/10/2022 0803 Last data filed at 03/10/2022 0427 Gross per 24 hour  Intake 2279.42 ml  Output 1085 ml  Net 1194.42 ml    Filed Weights   03/06/22 0333 03/07/22  0500 03/08/22 0500  Weight: 66.5 kg 67.3 kg 66.8 kg    Examination:  General: Awake, alert, resting comfortably in bed, no acute distress HEENT: Normocephalic, noted icteric sclera bilaterally, left medial conjunctival hemorrhage noted Neck:  Without mass or deformity.  Trachea is midline. Lungs: Without overt wheezes rales or rhonchi Heart:  Regular rate and rhythm.  Without murmurs, rubs, or gallops. Abdomen: Soft, distended, without obvious guarding Extremities: Without cyanosis, clubbing, edema, or obvious deformity. Skin: Jaundiced, warm and dry, no erythema/rashes noted.  Data Reviewed: I have personally reviewed following labs and imaging studies  CBC: Recent Labs  Lab 03/05/22 0930 03/06/22 0213 03/07/22 0138 03/08/22 0107 03/09/22 0257  WBC 6.6 6.8 6.1 5.8 8.1  HGB 6.3* 7.5* 7.2* 7.4* 8.8*  HCT 19.4* 23.0* 22.1* 23.2* 27.2*  MCV 91.9 90.9 90.6 93.2 93.5  PLT 105* 106* 108* 108* 116*    Basic Metabolic Panel: Recent Labs  Lab 03/03/22 1632 03/04/22 0050 03/04/22 1658 03/05/22 0654 03/06/22 0213 03/07/22 0138 03/08/22 0107 03/09/22 0257  NA 135 135 135 138 142 145 149* 153*  K 3.9 3.4* 3.8 3.9 3.8 3.5 3.8 3.5  CL 106 107 108 108 109 112* 114* 121*  CO2 19* 19* 17* 18* 22 25 24 22  $ GLUCOSE 136* 133* 123* 150* 148* 117* 111* 170*  BUN 29* 31* 31* 31* 28* 28* 23* 24*  CREATININE 1.86* 1.74* 1.18* 0.90 0.74 0.66 0.53 0.79  CALCIUM 8.9 9.0 9.3 9.5 9.6 9.6 9.6 9.6  MG 2.4 2.4 2.4 2.5*  --   --   --   --   PHOS 6.8* 5.7* 3.4 2.7  --   --   --   --     GFR: Estimated Creatinine Clearance: 87.5 mL/min (by C-G formula based on SCr of 0.79 mg/dL). Liver Function Tests: Recent Labs  Lab 03/04/22 0050 03/05/22 0930 03/09/22 0257  AST 73* 58* 76*  ALT 32 28 39  ALKPHOS 78 76 84  BILITOT 1.6* 2.5* 2.9*  PROT 6.1* 5.9* 6.1*  ALBUMIN 4.4 4.1 3.9    No results for input(s): "LIPASE", "AMYLASE" in the last 168 hours. Recent Labs  Lab 03/04/22 0050  03/05/22 1312 03/07/22 0138 03/09/22 1202  AMMONIA 133* 162* 41* 71*    Coagulation Profile: Recent Labs  Lab 03/05/22 0654 03/06/22 0213 03/07/22 0138 03/08/22 0107 03/09/22 0257  INR 1.9* 1.8* 1.8* 1.9* 2.0*    CBG: Recent Labs  Lab 03/09/22 1149 03/09/22 1727 03/09/22 2010 03/09/22 2342 03/10/22 0346  GLUCAP 131* 138* 156* 180* 158*    Sepsis Labs: Recent Labs  Lab 03/09/22 0257  PROCALCITON 0.10    Recent Results (from the past 240 hour(s))  Body fluid culture w Gram Stain     Status: None   Collection Time: 03/01/22  1:45 PM   Specimen: Ascitic; Peritoneal Fluid  Result Value Ref Range Status   Specimen Description   Final    ASCITIC Performed at [facility], [address] [place], [place] [address]    Special Requests   Final    NONE Performed at [facility], [address] [place], [place] [address]  Gram Stain   Final    CYTOSPIN SMEAR WBC PRESENT,BOTH PMN AND MONONUCLEAR NO ORGANISMS SEEN    Culture   Final    NO GROWTH 3 DAYS Performed at [facility] [address] [place], [place] [address]    Report Status 03/04/2022 FINAL  Final  Culture, blood (Routine X 2) w Reflex to ID Panel     Status: None   Collection Time: 03/01/22  4:48 PM   Specimen: BLOOD  Result Value Ref Range Status   Specimen Description BLOOD BLOOD RIGHT ARM  Final   Special Requests NONE  Final   Culture   Final    NO GROWTH 6 DAYS Performed at [facility], [address] [place], [place] [address]    Report Status 03/07/2022 FINAL  Final  Culture, blood (Routine X 2) w Reflex to ID Panel     Status: None   Collection Time: 03/01/22  4:48 PM   Specimen: BLOOD  Result Value Ref Range Status   Specimen Description BLOOD BLOOD LEFT HAND  Final   Special Requests NONE  Final   Culture   Final    NO GROWTH 6 DAYS Performed at [facility], [address] [place], [place] [address]    Report Status 03/07/2022 FINAL  Final  MRSA Next Gen by PCR,  Nasal     Status: None   Collection Time: 03/01/22  8:38 PM   Specimen: Nasal Mucosa; Nasal Swab  Result Value Ref Range Status   MRSA by PCR Next Gen NOT DETECTED NOT DETECTED Final    Comment: (NOTE) The GeneXpert MRSA Assay (FDA approved for NASAL specimens only), is one component of a comprehensive MRSA colonization surveillance program. It is not intended to diagnose MRSA infection nor to guide or monitor treatment for MRSA infections. Test performance is not FDA approved in patients less than 50 years old. Performed at [facility], [place] [address], [place], [place] [address]   Body fluid culture w Gram Stain     Status: None   Collection Time: 03/02/22 12:00 AM   Specimen: Peritoneal Washings; Body Fluid  Result Value Ref Range Status   Specimen Description PERITONEAL FLUID  Final   Special Requests NONE  Final   Gram Stain   Final    CYTOSPIN SMEAR WBC PRESENT,BOTH PMN AND MONONUCLEAR NO ORGANISMS SEEN    Culture   Final    NO GROWTH 3 DAYS Performed at [facility], [address] [place], [place] [address]    Report Status 03/05/2022 FINAL  Final  Urine Culture     Status: Abnormal   Collection Time: 03/02/22  1:50 AM   Specimen: Urine, Random  Result Value Ref Range Status   Specimen Description URINE, RANDOM  Final   Special Requests   Final    NONE Reflexed from [id number] Performed at [facility], [place] [address] [place], [place] [address]    Culture (A)  Final    >=100,000 COLONIES/mL KLEBSIELLA PNEUMONIAE Confirmed Extended Spectrum Beta-Lactamase Producer (ESBL).  In bloodstream infections from ESBL organisms, carbapenems are preferred over piperacillin/tazobactam. They are shown to have a lower risk of mortality.    Report Status 03/04/2022 FINAL  Final   Organism ID, Bacteria KLEBSIELLA PNEUMONIAE (A)  Final      Susceptibility   Klebsiella pneumoniae - MIC*    AMPICILLIN >=32 RESISTANT Resistant     CEFAZOLIN >=64 RESISTANT Resistant  CEFEPIME >=32 RESISTANT Resistant     CEFTRIAXONE >=64 RESISTANT Resistant     CIPROFLOXACIN 2 RESISTANT Resistant     GENTAMICIN >=16 RESISTANT Resistant     IMIPENEM <=0.25 SENSITIVE Sensitive     NITROFURANTOIN 64 INTERMEDIATE Intermediate     TRIMETH/SULFA >=320 RESISTANT Resistant     AMPICILLIN/SULBACTAM >=32 RESISTANT Resistant     PIP/TAZO 16 SENSITIVE Sensitive     * >=100,000 COLONIES/mL KLEBSIELLA PNEUMONIAE  Surgical PCR screen     Status: None   Collection Time: 03/08/22  8:22 AM   Specimen: Nasal Mucosa; Nasal Swab  Result Value Ref Range Status   MRSA, PCR NEGATIVE NEGATIVE Final   Staphylococcus aureus NEGATIVE NEGATIVE Final    Comment: (NOTE) The Xpert SA Assay (FDA approved for NASAL specimens in patients 17 years of age and older), is one component of a comprehensive surveillance program. It is not intended to diagnose infection nor to guide or monitor treatment. Performed at [facility], [place] [address], [place], [place] [address]    Radiology Studies: DG CHEST PORT 1 VIEW  Result Date: 03/10/2022 CLINICAL DATA:  [id number].  Congestion of upper airway. EXAM: PORTABLE CHEST 1 VIEW COMPARISON:  Portable chest 03/04/2022. FINDINGS: 12:14 a.m. Interval extubation, removal NGT insertion of a feeding tube. The feeding tube extends at least to the gastric antrum but is not included in the film beyond that point. The cardiac size is normal. No vascular congestion is seen. The lungs are expiratory. There is secondary bronchovascular crowding in the lung bases. No convincing focal pneumonia. The lungs are otherwise clear. The mediastinum is normally outlined.  No acute osseous findings. IMPRESSION: Expiratory chest x-ray with bronchovascular crowding in the lung bases. No convincing acute chest findings. Limited view of the bases. Electronically Signed   By: Telford Nab M.D.   On: 03/10/2022 00:38   DG Abd Portable 1V  Result Date: 03/08/2022 CLINICAL DATA:   Enteric tube placement EXAM: PORTABLE ABDOMEN - 1 VIEW COMPARISON:  03/05/2022 abdominal radiograph FINDINGS: Weighted enteric tube tip is in the descending duodenum. No dilated small bowel loops. No evidence of pneumatosis or pneumoperitoneum in the visualized upper abdomen. Mild streaky bibasilar lung opacities. IMPRESSION: 1. Weighted enteric tube tip is in the descending duodenum. 2. Mild streaky bibasilar lung opacities, favor atelectasis. Electronically Signed   By: Ilona Sorrel M.D.   On: 03/08/2022 09:42    Scheduled Meds:  ciprofloxacin  500 mg Per Tube Q breakfast   docusate  100 mg Per Tube BID   feeding supplement (PROSource TF20)  60 mL Per Tube Daily   folic acid  1 mg Per Tube Daily   free water  200 mL Per Tube Q4H   furosemide  20 mg Per Tube Daily   guaiFENesin  10 mL Oral Q4H   heparin  5,000 Units Subcutaneous Q8H   lactulose  10 g Per Tube TID   multivitamin with minerals  1 tablet Per Tube Daily   mupirocin ointment  1 Application Nasal BID   pantoprazole (PROTONIX) IV  40 mg Intravenous Q24H   rifaximin  550 mg Per Tube BID   spironolactone  50 mg Per Tube Daily   thiamine  100 mg Oral Daily   zinc sulfate  220 mg Per Tube Daily   Continuous Infusions:  sodium chloride Stopped (03/04/22 1519)   feeding supplement (OSMOLITE 1.5 CAL) 1,000 mL (03/09/22 1725)     LOS: 9 days   Time spent: 11mn  Little Ishikawa, DO [facility]  If 7PM-7AM, please contact night-coverage [email or web address]  03/10/2022, 8:03 AM

## 2022-03-10 NOTE — Progress Notes (Signed)
Dr. Marcellina Millin notified of Yellow MEWS and chg in Respiratory status. New Orders for Water bolus per [employer], Robitussin prn, and CXR. Pt and husband informed

## 2022-03-10 NOTE — Progress Notes (Signed)
Speech Language Pathology Treatment: Dysphagia  Patient Details Name: Erica Banks MRN: [id number] DOB: May 25, 1980 Today's Date: 03/10/2022 Time: [id number] SLP Time Calculation (min) (ACUTE ONLY): 9 min  Assessment / Plan / Recommendation Clinical Impression  Pt seen for ongoing dysphagia management.  Pt with increased fever, rapid heartrate overnight.  RN is concerned visitors gave frequent water/ice chips to pt who is no having respiratory issues. New CXR overnight: "No convincing acute chest findings. Limited view of the bases."  Pt still with very poor vocal quality.  With effort, some phonation achieved for short 2 word phrase.  Pt nodding and shaking head appropriately to SLP questions.  Pt indicates she does not really want POs at this time, but was agreeable to treatment.  SLP provided oral care.  Thick dried secretions removed, but some remained on soft palate.  After 2 teaspoon boluses of thin liquid, pt was able to mobilize secretions and expectorated a moderate sized, cohesive yellow secretions.  Pt with improved tolerance of thin liquids today.  No immediate coughing observed.  Pt with 2-3 swallows per bolus.   Pt would benefit from instrumental assessment prior to initiation of PO diet.  At present, pt will likely continue to require cortrak for nutrition as she does not have desire for PO intake.  Also given changes overnight, would recommend additional recovery time prior to eval.  Pt would like to discuss instrumental testing with husband as well who had stepped out prior to SLP visit.  Updated sign in room for ice chips only with staff and trained caregiver (husband) given RN concerns.  Pt is agreeable to holding instrumental testing for now and SLP will check back for readiness.  Continue to recommend FEES over MBS at this time 2/2 ongoing poor vocal quality.  Recommend pt remain NPO with alternate means of nutrition, hydration, and medication.  Pt may have ice chips and small sips  of water by spoon after good oral care, in moderation (4-5/hr), when fully awake alert, with upright positioning and 1:1 assistance from staff or trained caregiver.     HPI HPI: Erica Banks is a 42 yo F with Etoh Cirrhosis/ascites and h/o hep encephalopathy d/c from [facility] 1/29 to GI clinic 2/2 am to schedule paracentesis but woke up confused > referred from GI to ED. Lethargic, risk of aspiration and need for rx with lactulose so electively intubated.  ETT 2/2-2/8. Per RD note husbad reports "pt has a very poor appetite at baseline. 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  Also with history of IDA following with hematology with prescription iron infusions, GERD, PE, DVT, neuropathy.      SLP Plan   (FEES)      Recommendations for follow up therapy are one component of a multi-disciplinary discharge planning process, led by the attending physician.  Recommendations may be updated based on patient status, additional functional criteria and insurance authorization.    Recommendations  Diet recommendations: NPO Medication Administration: Via alternative means                Oral Care Recommendations: Oral care prior to ice chip/H20;Staff/trained caregiver to provide oral care Follow Up Recommendations:  ([place] [facility] at next level of care) SLP Visit Diagnosis: Dysphagia, unspecified (R13.10) Plan:  (FEES)  Celedonio Savage, [place], [place] Office: [phone] 03/10/2022, 12:25 PM

## 2022-03-11 DIAGNOSIS — K7682 Hepatic encephalopathy: Secondary | ICD-10-CM | POA: Diagnosis not present

## 2022-03-11 LAB — CBC
HCT: 28.4 % — ABNORMAL LOW (ref 36.0–46.0)
Hemoglobin: 8.7 g/dL — ABNORMAL LOW (ref 12.0–15.0)
MCH: 29.5 pg (ref 26.0–34.0)
MCHC: 30.6 g/dL (ref 30.0–36.0)
MCV: 96.3 fL (ref 80.0–100.0)
Platelets: 99 10*3/uL — ABNORMAL LOW (ref 150–400)
RBC: 2.95 MIL/uL — ABNORMAL LOW (ref 3.87–5.11)
RDW: 18.9 % — ABNORMAL HIGH (ref 11.5–15.5)
WBC: 11.1 10*3/uL — ABNORMAL HIGH (ref 4.0–10.5)
nRBC: 0.2 % (ref 0.0–0.2)

## 2022-03-11 LAB — COMPREHENSIVE METABOLIC PANEL
ALT: 48 U/L — ABNORMAL HIGH (ref 0–44)
AST: 88 U/L — ABNORMAL HIGH (ref 15–41)
Albumin: 3.6 g/dL (ref 3.5–5.0)
Alkaline Phosphatase: 82 U/L (ref 38–126)
Anion gap: 9 (ref 5–15)
BUN: 26 mg/dL — ABNORMAL HIGH (ref 6–20)
CO2: 23 mmol/L (ref 22–32)
Calcium: 8.8 mg/dL — ABNORMAL LOW (ref 8.9–10.3)
Chloride: 126 mmol/L — ABNORMAL HIGH (ref 98–111)
Creatinine, Ser: 0.84 mg/dL (ref 0.44–1.00)
GFR, Estimated: >60 mL/min (ref >60)
Glucose, Bld: 188 mg/dL — ABNORMAL HIGH (ref 70–99)
Potassium: 4 mmol/L (ref 3.5–5.1)
Sodium: 158 mmol/L — ABNORMAL HIGH (ref 135–145)
Total Bilirubin: 2.9 mg/dL — ABNORMAL HIGH (ref 0.3–1.2)
Total Protein: 5.8 g/dL — ABNORMAL LOW (ref 6.5–8.1)

## 2022-03-11 LAB — GLUCOSE, CAPILLARY
Glucose-Capillary: 168 mg/dL — ABNORMAL HIGH (ref 70–99)
Glucose-Capillary: 164 mg/dL — ABNORMAL HIGH (ref 70–99)
Glucose-Capillary: 163 mg/dL — ABNORMAL HIGH (ref 70–99)
Glucose-Capillary: 182 mg/dL — ABNORMAL HIGH (ref 70–99)
Glucose-Capillary: 151 mg/dL — ABNORMAL HIGH (ref 70–99)

## 2022-03-11 LAB — PROTIME-INR
INR: 2 — ABNORMAL HIGH (ref 0.8–1.2)
Prothrombin Time: 22.7 seconds — ABNORMAL HIGH (ref 11.4–15.2)

## 2022-03-11 LAB — AMMONIA: Ammonia: 57 umol/L — ABNORMAL HIGH (ref 9–35)

## 2022-03-11 MED ORDER — FREE WATER
200.0000 mL | Status: AC
  Administered 2022-03-11 (×4): 200 mL

## 2022-03-11 MED ORDER — ORAL CARE MOUTH RINSE
15.0000 mL | OROMUCOSAL | Status: DC
  Administered 2022-03-11 – 2022-03-18 (×30): 15 mL via OROMUCOSAL

## 2022-03-11 MED ORDER — FREE WATER
200.0000 mL | Status: DC
  Administered 2022-03-11 – 2022-03-12 (×4): 200 mL

## 2022-03-11 NOTE — Procedures (Signed)
Objective Swallowing Evaluation: Type of Study: FEES-Fiberoptic Endoscopic Evaluation of Swallow   Patient Details  Name: Erica Banks MRN: [id number] Date of Birth: July 09, 1980  Today's Date: 03/11/2022 Time: SLP Start Time (ACUTE ONLY): [id number] -SLP Stop Time (ACUTE ONLY): 1002  SLP Time Calculation (min) (ACUTE ONLY): 34 min   Past Medical History:  Past Medical History:  Diagnosis Date   Anemia    Cirrhosis ([place])    DVT (deep venous thrombosis) (HCC)    Low iron    Neuropathy    Neuropathy    Panic attacks    Pulmonary embolus ([place]) 2019   Past Surgical History:  Past Surgical History:  Procedure Laterality Date   ABDOMINAL HYSTERECTOMY     BIOPSY N/A 06/24/2013   Procedure: BIOPSY TERMINAL ILEUM;  Surgeon: Daneil Dolin, MD;  [facility]: [facility];  Service: Endoscopy;  Laterality: N/A;   CHOLECYSTECTOMY     COLONOSCOPY N/A 06/24/2013   [id number] hemorrhoids/otherwise normal    COLONOSCOPY WITH PROPOFOL N/A 05/31/2021   Surgeon: Daneil Dolin, MD; ery large nonbleeding internal and external hemorrhoids, portal colopathy.  Recommended repeat colonoscopy in 10 years.   ESOPHAGOGASTRODUODENOSCOPY (EGD) WITH PROPOFOL N/A 03/29/2021   Surgeon: Eloise Harman, DO;  grade 1 esophageal varices, portal hypertensive gastropathy with friable tissue with spontaneous ooze, normal duodenum.   ESOPHAGOGASTRODUODENOSCOPY (EGD) WITH PROPOFOL N/A 05/31/2021   Surgeon: Daneil Dolin, MD;  grade 1-2 esophageal varices without bleeding stigmata, advanced appearing portal hypertensive gastropathy.   ESOPHAGOGASTRODUODENOSCOPY (EGD) WITH PROPOFOL N/A 12/22/2021   Procedure: ESOPHAGOGASTRODUODENOSCOPY (EGD) WITH PROPOFOL;  Surgeon: Harvel Quale, MD;  Location: [facility];  Service: Gastroenterology;  Laterality: N/A;   nasal bone surgery     NASAL SINUS SURGERY     SINUS IRRIGATION     TONSILLECTOMY     TUBAL LIGATION     HPI: Erica Banks is a 42 yo F with  Etoh Cirrhosis/ascites and h/o hep encephalopathy d/c from [facility] 1/29 to GI clinic 2/2 am to schedule paracentesis but woke up confused > referred from GI to ED. Lethargic, risk of aspiration and need for rx with lactulose so electively intubated.  ETT 2/2-2/8. Per RD note husbad reports "pt has a very poor appetite at baseline. 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  Also with history of IDA following with hematology with prescription iron infusions, GERD, PE, DVT, neuropathy.   Subjective: awake, alert, daughter present for assessment    Recommendations for follow up therapy are one component of a multi-disciplinary discharge planning process, led by the attending physician.  Recommendations may be updated based on patient status, additional functional criteria and insurance authorization.  Assessment / Plan / Recommendation     03/11/2022   10:16 AM  Clinical Impressions  Clinical Impression Pt presents with a mild pharyngeal dysphagia c/b reduced laryngeal closure which resulted in audible aspiration of thin liquids.  Reflexive cough at least partially cleared aspiration.  Pt with improved vocal quality today and vocal folds are mobile.  There are bilateral white round excressences just below vocal folds, which appear c/w intubation trauma.  On scope passage there were significant secretions in pharynx.  There was penetration of saliva at interarytenoid space.  With bolus trials secretions were mobilized and pharynx cleared.  Swallow function seemingly  improved over course of study with secretion clearance.  Pt was able to bring up and expectorate some secretions after completion of study.  There was no penetration or aspiration of nectar thick liquid by straw, puree, or regular solids.  There  was pharyngeal residue with initial trial of puree, but this again suspected to be 2/2 presence of secretions and pharyngeal residuals cleared over course of studay. There was no appreciable residue with regular texture bolus.  Pt is safe to initiate oral diet as recommended below, but may require continued use of tube feeds to meet nutritional needs.  Pt appears to be a good candidate for swallow therapy.  She may also see improvement in swallow function as overall medical status improves.    Recommend regular texture diet with nectar thick liquid.  Pt may have ice chips in between meals, in moderation, after good oral care, when fully awake/alert, with upright positioning and supervision.   SLP Visit Diagnosis Dysphagia, pharyngeal phase (R13.13)  Impact on safety and function Mild aspiration risk         03/11/2022   10:16 AM  Treatment Recommendations  Treatment Recommendations Therapy as outlined in treatment plan below        03/11/2022   10:25 AM  Prognosis  Prognosis for improved oropharyngeal function Good       03/11/2022   10:16 AM  Diet Recommendations  SLP Diet Recommendations Regular solids;Nectar thick liquid  Liquid Administration via Straw;Cup  Medication Administration Whole meds with liquid  Compensations Slow rate;Small sips/bites;Minimize environmental distractions  Postural Changes Seated upright at 90 degrees         03/11/2022   10:16 AM  Other Recommendations  Oral Care Recommendations Oral care BID  Caregiver Recommendations Avoid jello, ice cream, thin soups, popsicles  Follow Up Recommendations --  Functional Status Assessment Patient has had a recent decline in their functional status and demonstrates the ability to make significant improvements in function in a reasonable and predictable amount of time.       03/11/2022   10:16 AM  Frequency and Duration   Speech Therapy Frequency (ACUTE ONLY) min 2x/week  Treatment Duration 2 weeks          03/11/2022   10:14 AM  Oral Phase  Oral Phase WFL  Oral - Nectar Straw WFL  Oral - Thin Straw WFL  Oral - Puree WFL  Oral - Regular [facility]       03/11/2022   10:15 AM  Pharyngeal Phase  Pharyngeal Phase Impaired  Pharyngeal- Nectar Straw WFL  Pharyngeal Material does not enter airway  Pharyngeal- Thin Straw Reduced airway/laryngeal closure;Trace aspiration  Pharyngeal Material enters airway, passes BELOW cords then ejected out  Pharyngeal- Puree Reduced tongue base retraction;Pharyngeal residue - posterior pharnyx;Pharyngeal residue - valleculae  Pharyngeal Material does not enter airway  Pharyngeal- Regular [facility]  Pharyngeal Material does not enter airway        03/11/2022   10:16 AM  Cervical Esophageal Phase   Cervical Esophageal Phase [facility]     Celedonio Savage, MA, [place] [facility] Office: [phone] 03/11/2022, 10:25 AM

## 2022-03-11 NOTE — Progress Notes (Signed)
PROGRESS NOTE    Erica Banks  [id number] DOB: 1980/03/09 DOA: 03/01/2022 PCP: Neale Burly, MD   Brief Narrative:  Erica Banks is a 42 y.o. female with medical history significant of chronic anemia of chronic disease, alcoholic cirrhosis, neuropathy, history of DVT/PE, and more presents ED with a chief complaint of general malaise and fatigue -family reports increased confusion nausea vomiting and diarrhea.  History was somewhat difficult to obtain at intake and patient had worsening encephalopathy in the ED and given profound lethargy and high risk for aspiration patient was intubated for airway protection.  Of note patient was recently discharged from [facility] on 02/25/2022 for similar episode of encephalopathy secondary to cirrhosis and worsening ascites.  She was to follow-up with GI clinic on 03/01/2022 for repeat paracentesis- at the time of presentation to the GI facility given her confusion she was redirected to the ED. History obtained per family indicates patient had been noncompliant with lactulose for at least 2 days due to her GI complaints of nausea vomiting and diarrhea.  Assessment & Plan:   Principal Problem:   Hepatic encephalopathy ([facility]) Active Problems:   Acute hepatic encephalopathy (HCC)   Acute respiratory failure with hypoxemia (HCC)   Decompensated hepatic cirrhosis (HCC)   Urinary tract infection due to ESBL Klebsiella   Protein-calorie malnutrition, severe   Acute on chronic hepatic encephalopathy and hyperammonemia, improving Acute on chronic decompensated liver cirrhosis secondary to alcohol use -Repeat ammonia level improving, adjust rifaximin and lactulose appropriately -Most recent paracentesis 03/04/2022 -3 L withdrawn without complication -Continues on ciprofloxacin for SBP prophylaxis  -Continues Lasix, spironolactone - poor p.o. intake continues on NG tube, adjust diuretics appropriately for p.o. intake -Continue vitamin supplements, thiamine  multivitamin and zinc  Severe Sepsis due to ESBL Klebsiella UTI with AKI Completed a ertapenem 03/07/2022   AKI without history of CKD resolved -Likely in the setting of nausea vomiting diarrhea and hypovolemia  -Exacerbated by chronic diuretics -resumed now that her creatinine has resolved back to baseline(WNL)  Profound aphasia and dysphagia  Moderate to severe protein caloric malnutrition -Continue TF through cortrak -Advance diet per SLP - undergoing direct vocal chord visualization with fiber optic endoscope this am - does not appear to be aspirating while I was present. -Regular diet, nectar thick liquids as of 03/11/22 -Patient able to phonate better today, weak but audible.  Short one-word sentences.   Acute hypoxemic respiratory failure requiring intubation mechanical ventilation, resolved Extubated 03/06/2022 -currently on room air without hypoxia  Hypovolemic hypernatremia  - 3L deficit - increase flushes 275m q4h until p.o. intake improves  DVT prophylaxis: Heparin Code Status: Full Family Communication: At bedside  Status is: Inpatient  Dispo: The patient is from: Home              Anticipated d/c is to: To be determined - likely SNF given previous evaluation with PT              Anticipated d/c date is: To be determined - 48-72h - will need to tolerate PO and have NG removed              Patient currently not medically stable for discharge  Consultants:  PCCM  Procedures:  Intubation/extubation as above Paracentesis  Antimicrobials:  Ciprofloxacin daily  Subjective: No acute issues or events overnight, patient's interactions and speech are improving - ROS otherwise negative.  Objective: Vitals:   03/10/22 1509 03/10/22 2030 03/10/22 2348 03/11/22 0455  BP: 119/63 123/66 136/69 116/72  Pulse: 100  (!) 107 95  Resp:  (!) 21 (!) 22 (!) 21  Temp: 98.1 F (36.7 C) 98.3 F (36.8 C) 99.4 F (37.4 C) 99.3 F (37.4 C)  TempSrc: Oral Oral Oral Oral  SpO2: 92%  94% 95% 96%  Weight:      Height:        Intake/Output Summary (Last 24 hours) at 03/11/2022 0754 Last data filed at 03/10/2022 2030 Gross per 24 hour  Intake --  Output 1435 ml  Net -1435 ml    Filed Weights   03/06/22 0333 03/07/22 0500 03/08/22 0500  Weight: 66.5 kg 67.3 kg 66.8 kg    Examination:  General: Awake, alert, resting comfortably in bed, no acute distress HEENT: Normocephalic, noted icteric sclera bilaterally, left medial conjunctival hemorrhage noted Neck:  Without mass or deformity.  Trachea is midline. Lungs: Without overt wheezes rales or rhonchi Heart:  Regular rate and rhythm.  Without murmurs, rubs, or gallops. Abdomen: Soft, distended, without obvious guarding Extremities: Without cyanosis, clubbing, edema, or obvious deformity. Skin: Warm and dry, no erythema/rashes noted.  Data Reviewed: I have personally reviewed following labs and imaging studies  CBC: Recent Labs  Lab 03/07/22 0138 03/08/22 0107 03/09/22 0257 03/10/22 1055 03/11/22 0711  WBC 6.1 5.8 8.1 10.0 11.1*  HGB 7.2* 7.4* 8.8* 8.3* 8.7*  HCT 22.1* 23.2* 27.2* 28.0* 28.4*  MCV 90.6 93.2 93.5 97.2 96.3  PLT 108* 108* 116* 103* 99*    Basic Metabolic Panel: Recent Labs  Lab 03/04/22 1658 03/05/22 0654 03/06/22 0213 03/07/22 0138 03/08/22 0107 03/09/22 0257 03/10/22 1055  NA 135 138 142 145 149* 153* 156*  K 3.8 3.9 3.8 3.5 3.8 3.5 3.9  CL 108 108 109 112* 114* 121* 126*  CO2 17* 18* 22 25 24 22 22  $ GLUCOSE 123* 150* 148* 117* 111* 170* 178*  BUN 31* 31* 28* 28* 23* 24* 24*  CREATININE 1.18* 0.90 0.74 0.66 0.53 0.79 0.88  CALCIUM 9.3 9.5 9.6 9.6 9.6 9.6 8.8*  MG 2.4 2.5*  --   --   --   --   --   PHOS 3.4 2.7  --   --   --   --   --     GFR: Estimated Creatinine Clearance: 79.6 mL/min (by C-G formula based on SCr of 0.88 mg/dL). Liver Function Tests: Recent Labs  Lab 03/05/22 0930 03/09/22 0257 03/10/22 1055  AST 58* 76* 94*  ALT 28 39 47*  ALKPHOS 76 84 79   BILITOT 2.5* 2.9* 3.1*  PROT 5.9* 6.1* 5.7*  ALBUMIN 4.1 3.9 3.5    No results for input(s): "LIPASE", "AMYLASE" in the last 168 hours. Recent Labs  Lab 03/05/22 1312 03/07/22 0138 03/09/22 1202 03/10/22 1055  AMMONIA 162* 41* 71* 67*    Coagulation Profile: Recent Labs  Lab 03/06/22 0213 03/07/22 0138 03/08/22 0107 03/09/22 0257 03/10/22 1055  INR 1.8* 1.8* 1.9* 2.0* 2.3*    CBG: Recent Labs  Lab 03/10/22 1140 03/10/22 1732 03/10/22 2008 03/11/22 0457 03/11/22 0730  GLUCAP 163* 151* 153* 168* 164*    Sepsis Labs: Recent Labs  Lab 03/09/22 0257  PROCALCITON 0.10    Recent Results (from the past 240 hour(s))  Body fluid culture w Gram Stain     Status: None   Collection Time: 03/01/22  1:45 PM   Specimen: Ascitic; Peritoneal Fluid  Result Value Ref Range Status   Specimen Description   Final    ASCITIC Performed at [facility], [address] [place], [place] [address]    Special Requests   Final    NONE Performed at [facility], [address], [place], [place] [address]    Gram Stain   Final    CYTOSPIN SMEAR WBC PRESENT,BOTH PMN AND MONONUCLEAR NO ORGANISMS SEEN    Culture   Final    NO GROWTH 3 DAYS Performed at [facility], [place] [address], [place], [place] [address]    Report Status 03/04/2022 FINAL  Final  Culture, blood (Routine X 2) w Reflex to ID Panel     Status: None   Collection Time: 03/01/22  4:48 PM   Specimen: BLOOD  Result Value Ref Range Status   Specimen Description BLOOD BLOOD RIGHT ARM  Final   Special Requests NONE  Final   Culture   Final    NO GROWTH 6 DAYS Performed at [facility], [address], [place], [place] [address]    Report Status 03/07/2022 FINAL  Final  Culture, blood (Routine X 2) w Reflex to ID Panel     Status: None   Collection Time: 03/01/22  4:48 PM   Specimen: BLOOD  Result Value Ref Range Status   Specimen Description BLOOD BLOOD LEFT HAND  Final   Special Requests NONE  Final    Culture   Final    NO GROWTH 6 DAYS Performed at [facility], [address], [place], [place] [address]    Report Status 03/07/2022 FINAL  Final  MRSA Next Gen by PCR, Nasal     Status: None   Collection Time: 03/01/22  8:38 PM   Specimen: Nasal Mucosa; Nasal Swab  Result Value Ref Range Status   MRSA by PCR Next Gen NOT DETECTED NOT DETECTED Final    Comment: (NOTE) The GeneXpert MRSA Assay (FDA approved for NASAL specimens only), is one component of a comprehensive MRSA colonization surveillance program. It is not intended to diagnose MRSA infection nor to guide or monitor treatment for MRSA infections. Test performance is not FDA approved in patients less than 66 years old. Performed at [facility], [place] [address], [place], [place] [address]   Body fluid culture w Gram Stain     Status: None   Collection Time: 03/02/22 12:00 AM   Specimen: Peritoneal Washings; Body Fluid  Result Value Ref Range Status   Specimen Description PERITONEAL FLUID  Final   Special Requests NONE  Final   Gram Stain   Final    CYTOSPIN SMEAR WBC PRESENT,BOTH PMN AND MONONUCLEAR NO ORGANISMS SEEN    Culture   Final    NO GROWTH 3 DAYS Performed at [facility], [address] [place], [place] [address]    Report Status 03/05/2022 FINAL  Final  Urine Culture     Status: Abnormal   Collection Time: 03/02/22  1:50 AM   Specimen: Urine, Random  Result Value Ref Range Status   Specimen Description URINE, RANDOM  Final   Special Requests   Final    NONE Reflexed from [id number] Performed at [facility], [place] [address], [place], [place] [address]    Culture (A)  Final    >=100,000 COLONIES/mL KLEBSIELLA PNEUMONIAE Confirmed Extended Spectrum Beta-Lactamase Producer (ESBL).  In bloodstream infections from ESBL organisms, carbapenems are preferred over piperacillin/tazobactam. They are shown to have a lower risk of mortality.    Report Status 03/04/2022 FINAL  Final   Organism ID,  Bacteria KLEBSIELLA PNEUMONIAE (  A)  Final      Susceptibility   Klebsiella pneumoniae - MIC*    AMPICILLIN >=32 RESISTANT Resistant     CEFAZOLIN >=64 RESISTANT Resistant     CEFEPIME >=32 RESISTANT Resistant     CEFTRIAXONE >=64 RESISTANT Resistant     CIPROFLOXACIN 2 RESISTANT Resistant     GENTAMICIN >=16 RESISTANT Resistant     IMIPENEM <=0.25 SENSITIVE Sensitive     NITROFURANTOIN 64 INTERMEDIATE Intermediate     TRIMETH/SULFA >=320 RESISTANT Resistant     AMPICILLIN/SULBACTAM >=32 RESISTANT Resistant     PIP/TAZO 16 SENSITIVE Sensitive     * >=100,000 COLONIES/mL KLEBSIELLA PNEUMONIAE  Surgical PCR screen     Status: None   Collection Time: 03/08/22  8:22 AM   Specimen: Nasal Mucosa; Nasal Swab  Result Value Ref Range Status   MRSA, PCR NEGATIVE NEGATIVE Final   Staphylococcus aureus NEGATIVE NEGATIVE Final    Comment: (NOTE) The Xpert SA Assay (FDA approved for NASAL specimens in patients 65 years of age and older), is one component of a comprehensive surveillance program. It is not intended to diagnose infection nor to guide or monitor treatment. Performed at [facility], [place] [address], [place], [place] [address]    Radiology Studies: DG CHEST PORT 1 VIEW  Result Date: 03/10/2022 CLINICAL DATA:  [id number].  Congestion of upper airway. EXAM: PORTABLE CHEST 1 VIEW COMPARISON:  Portable chest 03/04/2022. FINDINGS: 12:14 a.m. Interval extubation, removal NGT insertion of a feeding tube. The feeding tube extends at least to the gastric antrum but is not included in the film beyond that point. The cardiac size is normal. No vascular congestion is seen. The lungs are expiratory. There is secondary bronchovascular crowding in the lung bases. No convincing focal pneumonia. The lungs are otherwise clear. The mediastinum is normally outlined.  No acute osseous findings. IMPRESSION: Expiratory chest x-ray with bronchovascular crowding in the lung bases. No convincing acute chest  findings. Limited view of the bases. Electronically Signed   By: Telford Nab M.D.   On: 03/10/2022 00:38    Scheduled Meds:  ciprofloxacin  500 mg Per Tube Q breakfast   docusate  100 mg Per Tube BID   feeding supplement (PROSource TF20)  60 mL Per Tube Daily   folic acid  1 mg Per Tube Daily   free water  200 mL Per Tube Q4H   furosemide  20 mg Per Tube Daily   heparin  5,000 Units Subcutaneous Q8H   lactulose  10 g Per Tube TID   multivitamin with minerals  1 tablet Per Tube Daily   mupirocin ointment  1 Application Nasal BID   mouth rinse  15 mL Mouth Rinse 4 times per day   pantoprazole (PROTONIX) IV  40 mg Intravenous Q24H   rifaximin  550 mg Per Tube BID   spironolactone  50 mg Per Tube Daily   thiamine  100 mg Oral Daily   zinc sulfate  220 mg Per Tube Daily   Continuous Infusions:  sodium chloride Stopped (03/04/22 1519)   feeding supplement (OSMOLITE 1.5 CAL) 1,000 mL (03/10/22 1741)     LOS: 10 days   Time spent: 80mn  Izekiel Flegel C Harnoor Reta, DO [facility]  If 7PM-7AM, please contact night-coverage [email or web address]  03/11/2022, 7:54 AM

## 2022-03-11 NOTE — Plan of Care (Signed)
Tonight pt has progressed in many areas as the night progressed. Voice is now clear and stronger remains delayed at times. Pt knows she is in the hospital and why. Pt can move all extremities per self and against resistance slightly more on the left than the right. Pt voicing understanding to some instructions. Pt breathing deeper and now has a weak cough. Pt taught how to do Acapella for deep breathing and did 5 breaths. Encouraged to deep breathe and cough more. Pt tolerating turing from side to side better.

## 2022-03-11 NOTE — Progress Notes (Signed)
Physical Therapy Treatment Patient Details Name: Erica Banks MRN: [id number] DOB: [birth year] Today's Date: 03/11/2022   History of Present Illness Erica Banks is a 42 y.o. 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  Intubated for airway protection on 03/01/22 until 03/07/22. Patient was recently discharged from [facility] on 02/25/2022 for similar episode of encephalopathy secondary to cirrhosis and worsening ascites. Pt with PHM significant for but not limited to:  chronic anemia of chronic disease, alcoholic cirrhosis, neuropathy, esophageal varices, history of DVT/PE.    PT Comments    Patient awake, alert, and following some simple commands. When educated we were going to help her get OOB, she initiated moving towards the EOB. She required mod assist to complete task. Standing with +2 mod assist with posterior bias x 3 reps. On 3rd rep pivoted to chair (pt unable to advance feet to take steps, despite attempts to wt-shift and unweight LLE (closest to the chair).     Recommendations for follow up therapy are one component of a multi-disciplinary discharge planning process, led by the attending physician.  Recommendations may be updated based on patient status, additional functional criteria and insurance authorization.  Follow Up Recommendations  Skilled nursing-short term rehab (<3 hours/day) Can patient physically be transported by private vehicle: No   Assistance Recommended at Discharge Frequent or constant Supervision/Assistance  Patient can return home with the following A lot of help with bathing/dressing/bathroom;Assistance with cooking/housework;Assistance with feeding;Direct supervision/assist for medications management;Direct supervision/assist for financial management;Assist for transportation;Help with stairs or ramp for entrance;Two people to help with walking and/or transfers   Equipment Recommendations  Rolling walker (2 wheels);Wheelchair  (measurements PT);Wheelchair cushion (measurements PT)    Recommendations for Other Services       Precautions / Restrictions Precautions Precautions: Fall Precaution Comments: rectal tube Restrictions Weight Bearing Restrictions: No     Mobility  Bed Mobility Overal bed mobility: Needs Assistance Bed Mobility: Supine to Sit, Sit to Supine     Supine to sit: Mod assist, HOB elevated Sit to supine: Min assist   General bed mobility comments: pt reaching for PT's hand to pull herself up to sitting after assisted her legs over EOB; assist to raise legs onto bed    Transfers Overall transfer level: Needs assistance   Transfers: Sit to/from Stand, Bed to chair/wheelchair/BSC Sit to Stand: Mod assist, +2 physical assistance Stand pivot transfers: Mod assist, +2 physical assistance         General transfer comment: pt unable to advance feet to take pivotal steps to chair x 2 attempts; pivoted on 3rd attempt    Ambulation/Gait               General Gait Details: unable at this time   Stairs             Wheelchair Mobility    Modified Rankin (Stroke Patients Only)       Balance Overall balance assessment: Needs assistance Sitting-balance support: Bilateral upper extremity supported, Feet supported Sitting balance-Leahy Scale: Poor Sitting balance - Comments: one person assist to maintain balance with tendency to lose balance to left>rt Postural control: Right lateral lean, Left lateral lean Standing balance support: No upper extremity supported Standing balance-Leahy Scale: Poor Standing balance comment: posterior bias                            Cognition Arousal/Alertness: Awake/alert Behavior During Therapy: Flat affect Overall Cognitive Status:  No family/caregiver present to determine baseline cognitive functioning Area of Impairment: Following commands                       Following Commands: Follows one step commands  inconsistently, Follows one step commands with increased time       General Comments: initiating move to side of bed and later sit to stand        Exercises      General Comments General comments (skin integrity, edema, etc.): no vocalizations, but agreeable to get OOB      Pertinent Vitals/Pain Pain Assessment Pain Assessment: Faces Faces Pain Scale: No hurt    Home Living                          Prior Function            PT Goals (current goals can now be found in the care plan section) Acute Rehab PT Goals Patient Stated Goal: Pt unable to state Time For Goal Achievement: 03/23/22 Potential to Achieve Goals: Fair Progress towards PT goals: Progressing toward goals    Frequency    Min 3X/week      PT Plan Current plan remains appropriate    Co-evaluation              AM-PAC PT "6 Clicks" Mobility   Outcome Measure  Help needed turning from your back to your side while in a flat bed without using bedrails?: A Lot Help needed moving from lying on your back to sitting on the side of a flat bed without using bedrails?: A Lot Help needed moving to and from a bed to a chair (including a wheelchair)?: Total Help needed standing up from a chair using your arms (e.g., wheelchair or bedside chair)?: Total Help needed to walk in hospital room?: Total Help needed climbing 3-5 steps with a railing? : Total 6 Click Score: 8    End of Session Equipment Utilized During Treatment: Gait belt Activity Tolerance: Patient tolerated treatment well Patient left: in chair;with call bell/phone within reach;with chair alarm set;with nursing/sitter in room Nurse Communication: Mobility status;Need for lift equipment (recommend stedy as she stood well, but could not step her feet around) PT Visit Diagnosis: Muscle weakness (generalized) (M62.81)     Time: [id number] PT Time Calculation (min) (ACUTE ONLY): 19 min  Charges:  $Gait Training: 8-22 mins                       [place]  Office [phone]    Rexanne Mano 03/11/2022, 3:43 PM

## 2022-03-12 DIAGNOSIS — K7682 Hepatic encephalopathy: Secondary | ICD-10-CM | POA: Diagnosis not present

## 2022-03-12 LAB — PROTIME-INR
INR: 1.8 — ABNORMAL HIGH (ref 0.8–1.2)
Prothrombin Time: 21.1 seconds — ABNORMAL HIGH (ref 11.4–15.2)

## 2022-03-12 LAB — CBC
HCT: 29 % — ABNORMAL LOW (ref 36.0–46.0)
Hemoglobin: 8.8 g/dL — ABNORMAL LOW (ref 12.0–15.0)
MCH: 29.4 pg (ref 26.0–34.0)
MCHC: 30.3 g/dL (ref 30.0–36.0)
MCV: 97 fL (ref 80.0–100.0)
Platelets: 109 10*3/uL — ABNORMAL LOW (ref 150–400)
RBC: 2.99 MIL/uL — ABNORMAL LOW (ref 3.87–5.11)
RDW: 19 % — ABNORMAL HIGH (ref 11.5–15.5)
WBC: 10.5 10*3/uL (ref 4.0–10.5)
nRBC: 0.3 % — ABNORMAL HIGH (ref 0.0–0.2)

## 2022-03-12 LAB — COMPREHENSIVE METABOLIC PANEL
ALT: 60 U/L — ABNORMAL HIGH (ref 0–44)
AST: 104 U/L — ABNORMAL HIGH (ref 15–41)
Albumin: 3.9 g/dL (ref 3.5–5.0)
Alkaline Phosphatase: 87 U/L (ref 38–126)
Anion gap: 10 (ref 5–15)
BUN: 32 mg/dL — ABNORMAL HIGH (ref 6–20)
CO2: 22 mmol/L (ref 22–32)
Calcium: 9 mg/dL (ref 8.9–10.3)
Chloride: 125 mmol/L — ABNORMAL HIGH (ref 98–111)
Creatinine, Ser: 0.84 mg/dL (ref 0.44–1.00)
GFR, Estimated: >60 mL/min (ref >60)
Glucose, Bld: 203 mg/dL — ABNORMAL HIGH (ref 70–99)
Potassium: 3.9 mmol/L (ref 3.5–5.1)
Sodium: 157 mmol/L — ABNORMAL HIGH (ref 135–145)
Total Bilirubin: 3.1 mg/dL — ABNORMAL HIGH (ref 0.3–1.2)
Total Protein: 6.1 g/dL — ABNORMAL LOW (ref 6.5–8.1)

## 2022-03-12 LAB — AMMONIA: Ammonia: 102 umol/L — ABNORMAL HIGH (ref 9–35)

## 2022-03-12 LAB — GLUCOSE, CAPILLARY
Glucose-Capillary: 140 mg/dL — ABNORMAL HIGH (ref 70–99)
Glucose-Capillary: 143 mg/dL — ABNORMAL HIGH (ref 70–99)
Glucose-Capillary: 152 mg/dL — ABNORMAL HIGH (ref 70–99)
Glucose-Capillary: 157 mg/dL — ABNORMAL HIGH (ref 70–99)
Glucose-Capillary: 175 mg/dL — ABNORMAL HIGH (ref 70–99)
Glucose-Capillary: 181 mg/dL — ABNORMAL HIGH (ref 70–99)
Glucose-Capillary: 185 mg/dL — ABNORMAL HIGH (ref 70–99)
Glucose-Capillary: 207 mg/dL — ABNORMAL HIGH (ref 70–99)

## 2022-03-12 MED ORDER — FREE WATER
200.0000 mL | Status: AC
  Administered 2022-03-12 – 2022-03-13 (×10): 200 mL

## 2022-03-12 MED ORDER — ENSURE ENLIVE PO LIQD
237.0000 mL | Freq: Two times a day (BID) | ORAL | Status: DC
  Administered 2022-03-13: 237 mL via ORAL

## 2022-03-12 NOTE — Progress Notes (Signed)
Speech Language Pathology Treatment: Dysphagia  Patient Details Name: Erica Banks MRN: [id number] DOB: Sep 05, 1980 Today's Date: 03/12/2022 Time: [id number] SLP Time Calculation (min) (ACUTE ONLY): 22 min  Assessment / Plan / Recommendation Clinical Impression  Pt seen for ongoing dysphagia therapy.  RN and daughter report limited PO intake.  Pt today took medications.  There was throat clear x2 with liquid (NTL) wash with larger pills.  Pt tolerated small pills without any s/s of aspiration.  With liquid medication which included a thin liuqid mixed with thicker lactulose there was wet coughing following cup sips.  Pt was given straw with much easier retrieval of bolus as compared to cup sip and appeared to control bolus flow better with no coughing noted.  Pt may benefit from straw presentation of liquids.  If pt is impulsive, pinch and remove straw from oral cavity to limit bolus flow.  Medication administration seemed to mobilize secretions.  Pt brought up and expectorated clear secretions which were cleared with suction assistance from SLP.  Vocal quality was more clear following this.  Pt politely declined swallow exercises today.  Pt appeared to become tired and medication administration was effortful.    Recommend continuing regular texture diet and nectar thick liquids.    HPI HPI: Kyla Fantauzzi is a 42 yo F with Etoh Cirrhosis/ascites and h/o hep encephalopathy d/c from [facility] 1/29 to GI clinic 2/2 am to schedule paracentesis but woke up confused > referred from GI to ED. Lethargic, risk of aspiration and need for rx with lactulose so electively intubated.  ETT 2/2-2/8. Per RD note husbad reports "pt has a very poor appetite at baseline. 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  Also  with history of IDA following with hematology with prescription iron infusions, GERD, PE, DVT, neuropathy.      SLP Plan  Continue with current plan of care      Recommendations for follow up therapy are one component of a multi-disciplinary discharge planning process, led by the attending physician.  Recommendations may be updated based on patient status, additional functional criteria and insurance authorization.    Recommendations  Diet recommendations: Regular;Nectar-thick liquid Liquids provided via: Straw Medication Administration: Whole meds with liquid Supervision: Staff to assist with self feeding Compensations: Slow rate;Small sips/bites;Minimize environmental distractions (Use straw to improve bolus retrieval, May need to remove straw to limit bolus flow) Postural Changes and/or Swallow Maneuvers: Seated upright 90 degrees                Oral Care Recommendations: Oral care prior to ice chip/H20;Staff/trained caregiver to provide oral care;Oral care before and after PO;Oral care BID Follow Up Recommendations:  (Continue ST at next level of care) SLP Visit Diagnosis: Dysphagia, pharyngeal phase (R13.13) Plan: Continue with current plan of care           Celedonio Savage, [place], [place] Office: [phone] 03/12/2022, 11:15 AM

## 2022-03-12 NOTE — [place] FL2 (Signed)
[place] MEDICAID FL2 LEVEL OF CARE FORM     IDENTIFICATION  Patient Name: Erica Banks Birthdate: Jun 26, 1980 Sex: female Admission Date (Current Location): 03/01/2022  [facility] and [place] Number:  [employer] and Address:  [employer], [place] [address], [place], [place] [address]      Provider Number: [id number]  Attending Physician Name and Address:  Little Ishikawa, MD  Relative Name and Phone Number:       Current Level of Care: Hospital Recommended Level of Care: [place] Prior Approval Number:    Date Approved/Denied:   PASRR Number: [id number]  Discharge Plan: SNF    Current Diagnoses: Patient Active Problem List   Diagnosis Date Noted   Urinary tract infection due to ESBL Klebsiella 03/08/2022   Protein-calorie malnutrition, severe 03/08/2022   Decompensated hepatic cirrhosis ([place]) 03/04/2022   Acute respiratory failure with hypoxemia ([place]) 03/01/2022   Hepatic encephalopathy ([place]) 03/01/2022   Transaminitis 02/14/2022   Abdominal pain 02/14/2022   Acute hepatic encephalopathy ([place]) 02/10/2022   Hemorrhoids 02/10/2022   Generalized weakness 12/19/2021   AKI (acute kidney injury) ([place]) 12/19/2021   Hyperammonemia ([place]) 12/19/2021   Hypomagnesemia 12/19/2021   Ascites due to alcoholic cirrhosis ([place]) [id number]   Hypocalcemia 11/23/2021   Hypoalbuminemia 11/23/2021   Tobacco use disorder 11/23/2021   Esophageal varices without bleeding ([place]) 10/31/2021   Heartburn 08/23/2021   Nausea without vomiting 08/23/2021   Lesion of liver less than 1 cm in diameter    Acute blood loss anemia [id number]   Alcoholic cirrhosis of liver with ascites ([place]) 03/27/2021   Hyponatremia 03/27/2021   Iron deficiency anemia 03/27/2021   Folate deficiency 03/27/2021   Ascites    Cirrhosis of liver with ascites (HCC)    Intractable nausea and vomiting 07/20/2019   Cholelithiasis 07/20/2019   Prolonged QT interval  07/20/2019   Anemia 01/01/2015   Symptomatic anemia 01/01/2015   Hypokalemia 01/01/2015   Acute gastroenteritis 01/01/2015   Abnormal CT of the abdomen 06/08/2013   Gastroesophageal reflux disease 06/08/2013    Orientation RESPIRATION BLADDER Height & Weight     Self  Normal Incontinent Weight: 137 lb 5.6 oz (62.3 kg) Height:  5' 6"$  (167.6 cm)  BEHAVIORAL SYMPTOMS/MOOD NEUROLOGICAL BOWEL NUTRITION STATUS      Incontinent Diet (see DC summary)  AMBULATORY STATUS COMMUNICATION OF NEEDS Skin   Extensive Assist Verbally Normal                       Personal Care Assistance Level of Assistance  Bathing, Feeding, Dressing Bathing Assistance: Maximum assistance Feeding assistance: Maximum assistance Dressing Assistance: Maximum assistance     Functional Limitations Info             SPECIAL CARE FACTORS FREQUENCY  PT (By licensed PT), OT (By licensed OT), Speech therapy     PT Frequency: 5x/wk OT Frequency: 5x/wk     Speech Therapy Frequency: 5x/wk      Contractures Contractures Info: Not present    Additional Factors Info  Code Status, Allergies, Isolation Precautions Code Status Info: Full Allergies Info: Demerol     Isolation Precautions Info: Contact precautions, ESBL     Current Medications (03/12/2022):  This is the current hospital active medication list Current Facility-Administered Medications  Medication Dose Route Frequency Provider Last Rate Last Admin   0.9 %  sodium chloride infusion   Intravenous PRN Verlee Monte Hortencia Conradi, MD   Stopped at  03/04/22 1519   ciprofloxacin (CIPRO) tablet 500 mg  500 mg Per Tube Q breakfast Gaylan Gerold, DO   500 mg at 03/12/22 [id number]   docusate (COLACE) 50 MG/5ML liquid 100 mg  100 mg Per Tube BID Maryjane Hurter, MD   100 mg at 03/12/22 1048   feeding supplement (OSMOLITE 1.5 CAL) liquid 1,000 mL  1,000 mL Per Tube Continuous Brand Males, MD 55 mL/hr at 03/12/22 0811 Infusion Verify at 03/12/22 0811   feeding  supplement (PROSource TF20) liquid 60 mL  60 mL Per Tube Daily Brand Males, MD   60 mL at [id number]   folic acid (FOLVITE) tablet 1 mg  1 mg Per Tube Daily Andres Labrum D, PA-C   1 mg at 03/12/22 1101   free water 200 mL  200 mL Per Tube Q2H Little Ishikawa, MD       furosemide (LASIX) tablet 20 mg  20 mg Per Tube Daily Paytes, Austin A, RPH   20 mg at 03/12/22 1057   guaiFENesin (ROBITUSSIN) 100 MG/5ML liquid 10 mL  10 mL Oral Q4H PRN Crosley, Debby, MD       heparin injection 5,000 Units  5,000 Units Subcutaneous Q8H Mick Sell, PA-C   5,000 Units at 03/12/22 0622   lactulose (CHRONULAC) 10 GM/15ML solution 10 g  10 g Per Tube TID Maryjane Hurter, MD   10 g at 03/12/22 1048   lip balm (BLISTEX) ointment   Topical PRN Maryjane Hurter, MD       multivitamin with minerals tablet 1 tablet  1 tablet Per Tube Daily Paytes, Austin A, RPH   1 tablet at 03/12/22 1321   mupirocin ointment (BACTROBAN) 2 % 1 Application  1 Application Nasal BID Brand Males, MD   1 Application at [id number] 1109   ondansetron (ZOFRAN) injection 4 mg  4 mg Intravenous Q6H PRN Maryjane Hurter, MD   4 mg at 03/05/22 1847   Oral care mouth rinse  15 mL Mouth Rinse 4 times per day Little Ishikawa, MD   15 mL at 03/12/22 1321   pantoprazole (PROTONIX) injection 40 mg  40 mg Intravenous Q24H Mick Sell, PA-C   40 mg at 03/11/22 2152   phenylephrine-shark liver oil-mineral oil-petrolatum (PREPARATION H) rectal ointment 1 Application  1 Application Rectal BID PRN Anders Simmonds, MD   1 Application at 0000000 1730   polyvinyl alcohol (LIQUIFILM TEARS) 1.4 % ophthalmic solution 1 drop  1 drop Both Eyes PRN Maryjane Hurter, MD       rifaximin Doreene Nest) tablet 550 mg  550 mg Per Tube BID Mick Sell, PA-C   550 mg at 03/12/22 1048   spironolactone (ALDACTONE) tablet 50 mg  50 mg Per Tube Daily Maryjane Hurter, MD   50 mg at 03/12/22 1048   thiamine (VITAMIN B1) tablet 100 mg  100 mg Oral  Daily Paytes, Austin A, RPH   100 mg at 03/12/22 1047   zinc sulfate capsule 220 mg  220 mg Per Tube Daily Priscella Mann, RPH   220 mg at 03/12/22 1047     Discharge Medications: Please see discharge summary for a list of discharge medications.  Relevant Imaging Results:  Relevant Lab Results:   Additional Information SS#: [id number]  Geralynn Ochs, LCSW

## 2022-03-12 NOTE — TOC Initial Note (Signed)
Transition of Care [facility]) - Initial/Assessment Note    Patient Details  Name: Erica Banks MRN: [id number] Date of Birth: [birth year]  Transition of [facility]) CM/SW Contact:    Geralynn Ochs, LCSW Phone Number: 03/12/2022, 2:03 PM  Clinical Narrative:     CSW spoke with patient's spouse about recommendation for SNF placement. Spouse in agreement, hopeful for placement close to home. CSW faxed out referral, will follow with bed offers.              Expected Discharge Plan: Skilled Nursing Facility Barriers to Discharge: Continued Medical Work up, Ship broker   Patient Goals and CMS Choice Patient states their goals for this hospitalization and ongoing recovery are:: patient unable to participate in goal setting, not fully oriented CMS Medicare.gov Compare Post Acute Care list provided to:: Patient Represenative (must comment) Choice offered to / list presented to : [place] ownership interest in [facility].provided to:: Spouse    Expected Discharge Plan and Services     Post Acute Care Choice: [place] Living arrangements for the past 2 months: Single Family Home                                      Prior Living Arrangements/Services Living arrangements for the past 2 months: Single Family Home Lives with:: Spouse Patient language and need for interpreter reviewed:: No Do you feel safe going back to the place where you live?: Yes      Need for Family Participation in Patient Care: Yes (Comment) Care giver support system in place?: No (comment)   Criminal Activity/Legal Involvement Pertinent to Current Situation/Hospitalization: No - Comment as needed  Activities of Daily Living Home Assistive Devices/Equipment: Cane (specify quad or straight) (straight) ADL Screening (condition at time of admission) Patient's cognitive ability adequate to safely complete daily activities?: No Is the patient deaf or have  difficulty hearing?: No Does the patient have difficulty seeing, even when wearing glasses/contacts?: No Does the patient have difficulty concentrating, remembering, or making decisions?: Yes Patient able to express need for assistance with ADLs?: Yes Does the patient have difficulty dressing or bathing?: Yes Independently performs ADLs?: No Communication: Independent Dressing (OT): Needs assistance Is this a change from baseline?: Change from baseline, expected to last >3 days Grooming: Needs assistance Is this a change from baseline?: Change from baseline, expected to last >3 days Feeding: Needs assistance Is this a change from baseline?: Change from baseline, expected to last >3 days Bathing: Needs assistance Is this a change from baseline?: Change from baseline, expected to last >3 days Toileting: Dependent Is this a change from baseline?: Change from baseline, expected to last >3days In/Out Bed: Dependent Is this a change from baseline?: Change from baseline, expected to last >3 days Walks in Home: Dependent Does the patient have difficulty walking or climbing stairs?: No Weakness of Legs: Both Weakness of Arms/Hands: Both  Permission Sought/Granted Permission sought to share information with : Facility Sport and exercise psychologist, Family Supports Permission granted to share information with : Yes, Verbal Permission Granted  Share Information with NAME: Toula Moos  Permission granted to share info w AGENCY: SNF  Permission granted to share info w Relationship: Spouse     Emotional Assessment   Attitude/Demeanor/Rapport: Unable to Assess Affect (typically observed): Unable to Assess Orientation: : Oriented to Self Alcohol / Substance Use: Not Applicable Psych Involvement: No (comment)  Admission  diagnosis:  Hepatic encephalopathy ([place]) [K76.82] Decompensated hepatic cirrhosis ([place]) [K72.90, K74.60] Glasgow coma scale total score 3-8, at arrival to emergency department [facility])  [id number] Patient Active Problem List   Diagnosis Date Noted   Urinary tract infection due to ESBL Klebsiella 03/08/2022   Protein-calorie malnutrition, severe 03/08/2022   Decompensated hepatic cirrhosis ([place]) 03/04/2022   Acute respiratory failure with hypoxemia ([place]) 03/01/2022   Hepatic encephalopathy ([place]) 03/01/2022   Transaminitis 02/14/2022   Abdominal pain 02/14/2022   Acute hepatic encephalopathy ([place]) 02/10/2022   Hemorrhoids 02/10/2022   Generalized weakness 12/19/2021   AKI (acute kidney injury) ([place]) 12/19/2021   Hyperammonemia ([place]) 12/19/2021   Hypomagnesemia 12/19/2021   Ascites due to alcoholic cirrhosis ([place]) [id number]   Hypocalcemia 11/23/2021   Hypoalbuminemia 11/23/2021   Tobacco use disorder 11/23/2021   Esophageal varices without bleeding ([place]) 10/31/2021   Heartburn 08/23/2021   Nausea without vomiting 08/23/2021   Lesion of liver less than 1 cm in diameter    Acute blood loss anemia [id number]   Alcoholic cirrhosis of liver with ascites ([place]) 03/27/2021   Hyponatremia 03/27/2021   Iron deficiency anemia 03/27/2021   Folate deficiency 03/27/2021   Ascites    Cirrhosis of liver with ascites ([place])    Intractable nausea and vomiting 07/20/2019   Cholelithiasis 07/20/2019   Prolonged QT interval 07/20/2019   Anemia 01/01/2015   Symptomatic anemia 01/01/2015   Hypokalemia 01/01/2015   Acute gastroenteritis 01/01/2015   Abnormal CT of the abdomen 06/08/2013   Gastroesophageal reflux disease 06/08/2013   PCP:  Neale Burly, MD Pharmacy:   [place], [place] [id number] [place] [id number] Phone: [phone] Fax: [phone]     Social Determinants of Health (SDOH) Social History: SDOH Screenings   Food Insecurity: No Food Insecurity (03/08/2022)  Housing: Low Risk  (03/08/2022)  Transportation Needs: No Transportation Needs (03/08/2022)  Utilities: Not At Risk (03/08/2022)  Tobacco Use: High Risk  (03/01/2022)   SDOH Interventions:     Readmission Risk Interventions    02/14/2022    2:11 PM 12/19/2021    8:54 AM  Readmission Risk Prevention Plan  Transportation Screening Complete Complete  HRI or Home Care Consult Complete Complete  Social Work Consult for Alligator Planning/Counseling Complete Complete  Palliative Care Screening Not Applicable Not Applicable  Medication Review Press photographer) Complete Complete

## 2022-03-12 NOTE — Plan of Care (Signed)

## 2022-03-12 NOTE — Progress Notes (Addendum)
PROGRESS NOTE    Erica Banks  [id number] DOB: [birth year] DOA: 03/01/2022 PCP: Neale Burly, MD   Brief Narrative:  Erica Banks is a 42 y.o. female with medical history significant of chronic anemia of chronic disease, alcoholic cirrhosis, neuropathy, history of DVT/PE, and more presents ED with a chief complaint of general malaise and fatigue -family reports increased confusion nausea vomiting and diarrhea.  History was somewhat difficult to obtain at intake and patient had worsening encephalopathy in the ED and given profound lethargy and high risk for aspiration patient was intubated for airway protection.  Of note patient was recently discharged from [facility] on 02/25/2022 for similar episode of encephalopathy secondary to cirrhosis and worsening ascites.  She was to follow-up with GI clinic on 03/01/2022 for repeat paracentesis- at the time of presentation to the GI facility given her confusion she was redirected to the ED. History obtained per family indicates patient had been noncompliant with lactulose for at least 2 days due to her GI complaints of nausea vomiting and diarrhea.  Assessment & Plan:   Principal Problem:   Hepatic encephalopathy ([place]) Active Problems:   Acute hepatic encephalopathy (HCC)   Acute respiratory failure with hypoxemia (HCC)   Decompensated hepatic cirrhosis (HCC)   Urinary tract infection due to ESBL Klebsiella   Protein-calorie malnutrition, severe  Hypovolemic hypernatremia, severe - 3L deficit - increase flushes to 25m q2h until 03/13/22 am - then transition back to [id number] if sodium level improving.  Acute on chronic hepatic encephalopathy and hyperammonemia, improving Acute on chronic decompensated liver cirrhosis secondary to alcohol use -Repeat ammonia level increasing - add additional lactulose dose today, adjust rifaximin and lactulose appropriately -Most recent paracentesis 03/04/2022 -3 L withdrawn without complication -Continues on  ciprofloxacin for SBP prophylaxis  -Continues Lasix, spironolactone - poor p.o. intake continues on NG tube, adjust diuretics appropriately for p.o. intake -Continue vitamin supplements, thiamine multivitamin and zinc  Severe Sepsis due to ESBL Klebsiella UTI with AKI Completed a ertapenem 03/07/2022   Profound aphasia and dysphagia  Moderate to severe protein caloric malnutrition -Continue TF through cortrak -Advance diet per SLP - undergoing direct vocal chord visualization with fiber optic endoscope this am - does not appear to be aspirating while I was present. -Regular diet, nectar thick liquids as of 03/11/22 -advance diet as tolerated NG tube removal once patient can keep up with daily caloric and free water intake. -Patient continues to phonate better today, weak but audible.  Short one to two word sentences.   Acute hypoxemic respiratory failure requiring intubation mechanical ventilation, resolved Extubated 03/06/2022 -currently on room air without hypoxia  AKI without history of CKD resolved -Likely in the setting of nausea vomiting diarrhea and hypovolemia  -Exacerbated by chronic diuretics -resumed now that her creatinine has resolved back to baseline(WNL)  DVT prophylaxis: Heparin Code Status: Full Family Communication: At bedside  Status is: Inpatient  Dispo: The patient is from: Home              Anticipated d/c is to: To be determined - likely SNF given previous evaluation with PT              Anticipated d/c date is: To be determined - 48-72h - will need to tolerate PO and have NG removed              Patient currently not medically stable for discharge  Consultants:  PCCM  Procedures:  Intubation/extubation as above Paracentesis  Antimicrobials:  Ciprofloxacin  daily  Subjective: No acute issues or events overnight, patient's interactions and speech are improving - ROS otherwise negative.  Objective: Vitals:   03/11/22 1613 03/11/22 2000 03/12/22 0000  03/12/22 0500  BP: 118/68 124/69 124/72   Pulse: (!) 101 98 99   Resp: 14  20   Temp: 99 F (37.2 C) 99.6 F (37.6 C) 99.4 F (37.4 C)   TempSrc: Oral Oral Oral   SpO2: 97% 99% 98%   Weight:  63.3 kg  62.3 kg  Height:        Intake/Output Summary (Last 24 hours) at 03/12/2022 0735 Last data filed at 03/12/2022 0100 Gross per 24 hour  Intake 1408.17 ml  Output 1000 ml  Net 408.17 ml    Filed Weights   03/08/22 0500 03/11/22 2000 03/12/22 0500  Weight: 66.8 kg 63.3 kg 62.3 kg    Examination:  General: Awake, alert, resting comfortably in bed, no acute distress HEENT: Normocephalic, noted icteric sclera bilaterally, left medial conjunctival hemorrhage noted Neck:  Without mass or deformity.  Trachea is midline. Lungs: Without overt wheezes rales or rhonchi Heart:  Regular rate and rhythm.  Without murmurs, rubs, or gallops. Abdomen: Soft, distended, without obvious guarding Extremities: Without cyanosis, clubbing, edema, or obvious deformity. Skin: Warm and dry, no erythema/rashes noted.  Data Reviewed: I have personally reviewed following labs and imaging studies  CBC: Recent Labs  Lab 03/08/22 0107 03/09/22 0257 03/10/22 1055 03/11/22 0711 03/12/22 0326  WBC 5.8 8.1 10.0 11.1* 10.5  HGB 7.4* 8.8* 8.3* 8.7* 8.8*  HCT 23.2* 27.2* 28.0* 28.4* 29.0*  MCV 93.2 93.5 97.2 96.3 97.0  PLT 108* 116* 103* 99* 109*    Basic Metabolic Panel: Recent Labs  Lab 03/08/22 0107 03/09/22 0257 03/10/22 1055 03/11/22 0711 03/12/22 0326  NA 149* 153* 156* 158* 157*  K 3.8 3.5 3.9 4.0 3.9  CL 114* 121* 126* 126* 125*  CO2 24 22 22 23 22  $ GLUCOSE 111* 170* 178* 188* 203*  BUN 23* 24* 24* 26* 32*  CREATININE 0.53 0.79 0.88 0.84 0.84  CALCIUM 9.6 9.6 8.8* 8.8* 9.0    GFR: Estimated Creatinine Clearance: 83.3 mL/min (by C-G formula based on SCr of 0.84 mg/dL). Liver Function Tests: Recent Labs  Lab 03/05/22 0930 03/09/22 0257 03/10/22 1055 03/11/22 0711  03/12/22 0326  AST 58* 76* 94* 88* 104*  ALT 28 39 47* 48* 60*  ALKPHOS 76 84 79 82 87  BILITOT 2.5* 2.9* 3.1* 2.9* 3.1*  PROT 5.9* 6.1* 5.7* 5.8* 6.1*  ALBUMIN 4.1 3.9 3.5 3.6 3.9    No results for input(s): "LIPASE", "AMYLASE" in the last 168 hours. Recent Labs  Lab 03/07/22 0138 03/09/22 1202 03/10/22 1055 03/11/22 0711 03/12/22 0326  AMMONIA 41* 71* 67* 57* 102*    Coagulation Profile: Recent Labs  Lab 03/08/22 0107 03/09/22 0257 03/10/22 1055 03/11/22 0711 03/12/22 0326  INR 1.9* 2.0* 2.3* 2.0* 1.8*    CBG: Recent Labs  Lab 03/11/22 1230 03/11/22 1609 03/11/22 2011 03/12/22 0041 03/12/22 0439  GLUCAP 163* 182* 151* 175* 181*    Sepsis Labs: Recent Labs  Lab 03/09/22 0257  PROCALCITON 0.10    Recent Results (from the past 240 hour(s))  Surgical PCR screen     Status: None   Collection Time: 03/08/22  8:22 AM   Specimen: Nasal Mucosa; Nasal Swab  Result Value Ref Range Status   MRSA, PCR NEGATIVE NEGATIVE Final   Staphylococcus aureus NEGATIVE NEGATIVE Final    Comment: (NOTE)  The Xpert SA Assay (FDA approved for NASAL specimens in patients 26 years of age and older), is one component of a comprehensive surveillance program. It is not intended to diagnose infection nor to guide or monitor treatment. Performed at [facility], [place] [address], [place], [place] [address]    Radiology Studies: No results found.  Scheduled Meds:  ciprofloxacin  500 mg Per Tube Q breakfast   docusate  100 mg Per Tube BID   feeding supplement (PROSource TF20)  60 mL Per Tube Daily   folic acid  1 mg Per Tube Daily   free water  200 mL Per Tube Q4H   furosemide  20 mg Per Tube Daily   heparin  5,000 Units Subcutaneous Q8H   lactulose  10 g Per Tube TID   multivitamin with minerals  1 tablet Per Tube Daily   mupirocin ointment  1 Application Nasal BID   mouth rinse  15 mL Mouth Rinse 4 times per day   pantoprazole (PROTONIX) IV  40 mg Intravenous Q24H    rifaximin  550 mg Per Tube BID   spironolactone  50 mg Per Tube Daily   thiamine  100 mg Oral Daily   zinc sulfate  220 mg Per Tube Daily   Continuous Infusions:  sodium chloride Stopped (03/04/22 1519)   feeding supplement (OSMOLITE 1.5 CAL) 1,000 mL (03/11/22 2023)     LOS: 11 days   Time spent: 69mn  Chuong Casebeer C Aneita Kiger, DO [facility]  If 7PM-7AM, please contact night-coverage [email or web address]  03/12/2022, 7:35 AM

## 2022-03-12 NOTE — Progress Notes (Signed)
Physical Therapy Treatment Patient Details Name: Erica Banks MRN: [id number] DOB: May 13, 1980 Today's Date: 03/12/2022   History of Present Illness [name] is a 42 y.o. 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  Intubated for airway protection on 03/01/22 until 03/07/22. Patient was recently discharged from [facility] on 02/25/2022 for similar episode of encephalopathy secondary to cirrhosis and worsening ascites. Pt with PHM significant for but not limited to:  chronic anemia of chronic disease, alcoholic cirrhosis, neuropathy, esophageal varices, history of DVT/PE.    PT Comments    Patient more alert and participating more than 2/12 session. She continues with decr cognition and easily distracted/loses track of what she has been asked to do. Sitting balance very variable between minguard and max assist (more assist definitely when she attends to an OT task). After standing trials, pt requesting to lie down and assisted back to bed.    Recommendations for follow up therapy are one component of a multi-disciplinary discharge planning process, led by the attending physician.  Recommendations may be updated based on patient status, additional functional criteria and insurance authorization.  Follow Up Recommendations  Skilled nursing-short term rehab (<3 hours/day) Can patient physically be transported by private vehicle: No   Assistance Recommended at Discharge Frequent or constant Supervision/Assistance  Patient can return home with the following A lot of help with bathing/dressing/bathroom;Assistance with cooking/housework;Assistance with feeding;Direct supervision/assist for medications management;Direct supervision/assist for financial management;Assist for transportation;Help with stairs or ramp for entrance;Two people to help with walking and/or transfers   Equipment Recommendations  Rolling walker (2 wheels);Wheelchair (measurements PT);Wheelchair  cushion (measurements PT)    Recommendations for Other Services       Precautions / Restrictions Precautions Precautions: Fall Precaution Comments: rectal tube Restrictions Weight Bearing Restrictions: No     Mobility  Bed Mobility Overal bed mobility: Needs Assistance Bed Mobility: Supine to Sit, Sit to Supine     Supine to sit: Mod assist, HOB elevated Sit to supine: Min assist   General bed mobility comments: pt reaching for PT's hand to pull herself up to sitting after assisted her legs over EOB; assist to raise legs onto bed    Transfers Overall transfer level: Needs assistance Equipment used: 2 person hand held assist Transfers: Sit to/from Stand Sit to Stand: Mod assist, +2 physical assistance           General transfer comment: Patient performed 2 sit to stands from EOB with mod assist x2 for support and limited standing tolerance    Ambulation/Gait               General Gait Details: unable at this time   Stairs             Wheelchair Mobility    Modified Rankin (Stroke Patients Only)       Balance Overall balance assessment: Needs assistance Sitting-balance support: Bilateral upper extremity supported, Feet supported Sitting balance-Leahy Scale: Poor Sitting balance - Comments: one person assist to maintain balance with tendency to lose balance to left>rt Postural control: Right lateral lean, Left lateral lean Standing balance support: Bilateral upper extremity supported Standing balance-Leahy Scale: Poor Standing balance comment: posterior bias                            Cognition Arousal/Alertness: Awake/alert Behavior During Therapy: Flat affect Overall Cognitive Status: No family/caregiver present to determine baseline cognitive functioning Area of Impairment: Following commands  Following Commands: Follows one step commands inconsistently, Follows one step commands with increased  time       General Comments: easily distracted        Exercises      General Comments        Pertinent Vitals/Pain Pain Assessment Pain Assessment: Faces Faces Pain Scale: Hurts little more Pain Location: peri-area when placing purewick Pain Descriptors / Indicators: Discomfort Pain Intervention(s): Other (comment) (RN informed)    Home Living                          Prior Function            PT Goals (current goals can now be found in the care plan section) Acute Rehab PT Goals Patient Stated Goal: Pt unable to state Time For Goal Achievement: 03/23/22 Potential to Achieve Goals: Fair Progress towards PT goals: Progressing toward goals    Frequency    Min 3X/week      PT Plan Current plan remains appropriate    Co-evaluation PT/OT/SLP Co-Evaluation/Treatment: Yes Reason for Co-Treatment: Complexity of the patient's impairments (multi-system involvement);Necessary to address cognition/behavior during functional activity PT goals addressed during session: Mobility/safety with mobility;Balance OT goals addressed during session: ADL's and self-care      AM-PAC PT "6 Clicks" Mobility   Outcome Measure  Help needed turning from your back to your side while in a flat bed without using bedrails?: A Lot Help needed moving from lying on your back to sitting on the side of a flat bed without using bedrails?: A Lot Help needed moving to and from a bed to a chair (including a wheelchair)?: Total Help needed standing up from a chair using your arms (e.g., wheelchair or bedside chair)?: Total Help needed to walk in hospital room?: Total Help needed climbing 3-5 steps with a railing? : Total 6 Click Score: 8    End of Session Equipment Utilized During Treatment: Gait belt Activity Tolerance: Patient limited by fatigue (asking to return to supine because she was tired) Patient left: with call bell/phone within reach;in bed;with bed alarm set;with  nursing/sitter in room Nurse Communication: Mobility status PT Visit Diagnosis: Muscle weakness (generalized) (M62.81)     Time: [id number] PT Time Calculation (min) (ACUTE ONLY): 29 min  Charges:  $Therapeutic Activity: 8-22 mins                      Arby Barrette, PT Acute Rehabilitation Services  Office [phone]    Rexanne Mano 03/12/2022, 2:16 PM

## 2022-03-12 NOTE — Progress Notes (Signed)
Occupational Therapy Treatment Patient Details Name: Erica Banks MRN: [id number] DOB: 22-Jan-1981 Today's Date: 03/12/2022   History of present illness Erica Banks is a 42 y.o. 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  Intubated for airway protection on 03/01/22 until 03/07/22. Patient was recently discharged from [facility] on 02/25/2022 for similar episode of encephalopathy secondary to cirrhosis and worsening ascites. Pt with PHM significant for but not limited to:  chronic anemia of chronic disease, alcoholic cirrhosis, neuropathy, esophageal varices, history of DVT/PE.   OT comments  Patient demonstrating good gains this treatment session. Patient able to participate with LB dressing with donning socks at bed level with figure four technique. Patient required frequent cues and mod assist to get to EOB. Patient able to participate with grooming with difficulty getting toothbrush to mouth with RUE without support at elbow but was able to complete without assistance. Patient able to perform 2 stands from EOB with x2 assist with HHA. Patient will benefit from continued OT/PT treatment to increase independence with bed mobility, transfers, and self care. Acute OT to continue to follow.    Recommendations for follow up therapy are one component of a multi-disciplinary discharge planning process, led by the attending physician.  Recommendations may be updated based on patient status, additional functional criteria and insurance authorization.    Follow Up Recommendations  Skilled nursing-short term rehab (<3 hours/day)     Assistance Recommended at Discharge Frequent or constant Supervision/Assistance  Patient can return home with the following  A lot of help with bathing/dressing/bathroom;Two people to help with walking and/or transfers;Direct supervision/assist for medications management;Assistance with cooking/housework;Assist for transportation   Equipment  Recommendations  Other (comment) (TBD next venue)    Recommendations for Other Services      Precautions / Restrictions Precautions Precautions: Fall Precaution Comments: rectal tube Restrictions Weight Bearing Restrictions: No       Mobility Bed Mobility Overal bed mobility: Needs Assistance Bed Mobility: Supine to Sit, Sit to Supine     Supine to sit: Mod assist, HOB elevated Sit to supine: Mod assist   General bed mobility comments: increased time and assistance with bed pad to scoot to EOB    Transfers Overall transfer level: Needs assistance Equipment used: 2 person hand held assist Transfers: Sit to/from Stand Sit to Stand: Mod assist, +2 physical assistance           General transfer comment: Patient performed 2 sit to stands from EOB with mod assist x2 for support and limited standing tolerance     Balance Overall balance assessment: Needs assistance Sitting-balance support: Bilateral upper extremity supported, Feet supported Sitting balance-Leahy Scale: Poor Sitting balance - Comments: min to mod assist for sitting balance with bouts of min guard with patient leaning to right, often able to correct with verbal cues Postural control: Right lateral lean, Left lateral lean Standing balance support: Bilateral upper extremity supported Standing balance-Leahy Scale: Poor Standing balance comment: dependent on therapists for support                           ADL either performed or assessed with clinical judgement   ADL Overall ADL's : Needs assistance/impaired     Grooming: Wash/dry hands;Oral care;Sitting;Moderate assistance Grooming Details (indicate cue type and reason): on EOB, patient required support at elbow initially to brush teeth but was able to hold hand up with time  Lower Body Dressing: Moderate assistance;Bed level Lower Body Dressing Details (indicate cue type and reason): able to donn socks with mod assist                General ADL Comments: continues to demonstrate weak UEs but improment with functional use    Extremity/Trunk Assessment Upper Extremity Assessment RUE Deficits / Details: able to use to assist with grooming and bed mobility LUE Deficits / Details: Able to assist with grooming and bed mobility            Vision       Perception     Praxis      Cognition Arousal/Alertness: Awake/alert Behavior During Therapy: Flat affect Overall Cognitive Status: No family/caregiver present to determine baseline cognitive functioning Area of Impairment: Following commands                       Following Commands: Follows one step commands inconsistently, Follows one step commands with increased time       General Comments: easily distracted        Exercises      Shoulder Instructions       General Comments      Pertinent Vitals/ Pain       Pain Assessment Pain Assessment: Faces Faces Pain Scale: No hurt Pain Intervention(s): Monitored during session  Home Living                                          Prior Functioning/Environment              Frequency  Min 2X/week        Progress Toward Goals  OT Goals(current goals can now be found in the care plan section)  Progress towards OT goals: Progressing toward goals  Acute Rehab OT Goals Patient Stated Goal: none stated OT Goal Formulation: With patient Time For Goal Achievement: 03/23/22 Potential to Achieve Goals: Fair ADL Goals Pt Will Perform Grooming: with max assist;with mod assist;sitting Additional ADL Goal #1: Pt will tolerate sitting EOB x 5-10 minutes with mod - min A for balance/support during grooming tasks Additional ADL Goal #2: Pt will tolerate AAROM of B UEs in all planes to decrease risk of joint pain and stiffness  Plan Discharge plan remains appropriate    Co-evaluation    PT/OT/SLP Co-Evaluation/Treatment: Yes Reason for Co-Treatment:  Complexity of the patient's impairments (multi-system involvement);For patient/therapist safety   OT goals addressed during session: ADL's and self-care      AM-PAC OT "6 Clicks" Daily Activity     Outcome Measure   Help from another person eating meals?: Total Help from another person taking care of personal grooming?: A Lot Help from another person toileting, which includes using toliet, bedpan, or urinal?: Total Help from another person bathing (including washing, rinsing, drying)?: A Lot Help from another person to put on and taking off regular upper body clothing?: A Lot Help from another person to put on and taking off regular lower body clothing?: A Lot 6 Click Score: 10    End of Session    OT Visit Diagnosis: Other abnormalities of gait and mobility (R26.89);Muscle weakness (generalized) (M62.81);Other symptoms and signs involving cognitive function   Activity Tolerance Patient tolerated treatment well   Patient Left in bed;with call bell/phone within reach;with bed alarm set   Nurse Communication Mobility status  [id number] OT Time Calculation (min): 26 min  Charges: OT General Charges $OT Visit: 1 Visit OT Treatments $Self Care/Home Management : 8-22 mins  Lodema Hong, [place]  Office [place] 03/12/2022, 2:08 PM

## 2022-03-12 NOTE — Progress Notes (Signed)
Nutrition Follow-up  DOCUMENTATION CODES:  Severe malnutrition in context of chronic illness  INTERVENTION:  Continue current diet per SLP recommendation Continue TF via cortrak Osmolite 1.5 @ 60m/h (13275md) Prosource TF 20 1x/d This will provide 2060kcal, 102g of protein, and 10063mf free water Continue vitamin regimen for hx EtOH abuse Ensure Enlive po BID, each supplement provides 350 kcal and 20 grams of protein. When cold, ensure is nectar thick. If supplement rises to room temperature, will need to re-thicken  Calorie Count x 48 hours  NUTRITION DIAGNOSIS:  Severe Malnutrition related to chronic illness (cirrhosis) as evidenced by severe fat depletion, severe muscle depletion. - remains applicable  GOAL:  Patient will meet greater than or equal to 90% of their needs - progressing, diet in place, supplements  MONITOR:  Diet advancement, I & O's, Weight trends, TF tolerance  REASON FOR ASSESSMENT:  Ventilator, Consult Enteral/tube feeding initiation and management  ASSESSMENT:  [age]ar old female who presented to the ED on 2/02 with AMS. 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 Pt required intubation. Pt admitted with severe hepatic encephalopathy, acute renal failure.  2/2 - intubated, paracentesis, 3L removed  Pt resting in bed at the time of assessment. Cortrak in place and infusing at goal. Pt awake and alert, but seems lethargic. Husband at bedside. Pt reports appetite is not very good but endorses that it was poor PTA and this is about her baseline. Discussed starting kcal count over the next few days to determine if she could meet fluid and nutrition needs orally. Did discuss that she was receiving quite a bit of free water via tube and encouraged her to take in as much PO as possible.  Did discuss nutrition supplements. Pt agreeable to Ensure. Advised that  ensure was nectar thick when cold but that if she let it rise to room temperature she would need to have it thickened. Husband and pt express understanding. Prefers strawberry flavors.   Intake/Output Summary (Last 24 hours) at 03/12/2022 1424 Last data filed at 03/12/2022 [id number] per 24 hour  Intake 1617.17 ml  Output 1000 ml  Net 617.17 ml  Net IO Since Admission: -4,849.92 mL [03/12/22 1424]   Nutritionally Relevant Medications: Scheduled Meds:  ciprofloxacin  500 mg Per Tube Q breakfast   docusate  100 mg Per Tube BID   PROSource TF20  60 mL Per Tube Daily   folic acid  1 mg Per Tube Daily   free water  200 mL Per Tube Q2H   furosemide  20 mg Per Tube Daily   lactulose  10 g Per Tube TID   multivitamin with minerals  1 tablet Per Tube Daily   pantoprazole IV  40 mg Intravenous Q24H   spironolactone  50 mg Per Tube Daily   thiamine  100 mg Oral Daily   zinc sulfate  220 mg Per Tube Daily   Continuous Infusions:  feeding supplement (OSMOLITE 1.5 CAL) 55 mL/hr at 03/12/22 0811   PRN Meds: ondansetron  Labs Reviewed: Na 157, Chloride 125 BUN 32 CBG ranges from 140-203 mg/dL over the last 24 hours   NUTRITION - FOCUSED PHYSICAL EXAM: Flowsheet Row Most Recent Value  Orbital Region Severe depletion  Upper Arm Region Severe depletion  Thoracic and Lumbar Region Moderate depletion  Buccal Region Severe depletion  Temple Region Severe depletion  Clavicle Bone Region Severe depletion  Clavicle and Acromion Bone Region Severe depletion  Scapular Bone Region Moderate depletion  Dorsal Hand Unable to assess  Patellar Region Moderate depletion  Anterior Thigh Region Moderate depletion  Posterior Calf Region Mild depletion  Edema (RD Assessment) Moderate  [abdominal ascites, BLE]  Hair Reviewed  Eyes Reviewed  Mouth Reviewed  Skin Reviewed  Nails Unable to assess   Diet Order:   Diet Order             Diet regular Fluid consistency: Nectar Thick  Diet effective now                    EDUCATION NEEDS:  Not appropriate for education at this time  Skin:  Skin Assessment: Reviewed RN Assessment  Last BM:  2/13 - type 6  Height:  Ht Readings from Last 1 Encounters:  03/05/22 5' 6"$  (1.676 m)    Weight:  Wt Readings from Last 1 Encounters:  03/12/22 62.3 kg    Ideal Body Weight:  59.1 kg  BMI:  Body mass index is 22.17 kg/m.  Estimated Nutritional Needs:  Kcal:  2000-2200 Protein:  100-115 grams Fluid:  >1.8 L    Ranell Patrick, RD, LDN Clinical Dietitian RD pager # available in [place]  After hours/weekend pager # available in [facility]

## 2022-03-13 ENCOUNTER — Inpatient Hospital Stay (HOSPITAL_COMMUNITY): Payer: Medicaid Other

## 2022-03-13 DIAGNOSIS — K7682 Hepatic encephalopathy: Secondary | ICD-10-CM | POA: Diagnosis not present

## 2022-03-13 HISTORY — PX: IR PARACENTESIS: IMG2679

## 2022-03-13 LAB — COMPREHENSIVE METABOLIC PANEL
ALT: 59 U/L — ABNORMAL HIGH (ref 0–44)
AST: 92 U/L — ABNORMAL HIGH (ref 15–41)
Albumin: 3.5 g/dL (ref 3.5–5.0)
Alkaline Phosphatase: 83 U/L (ref 38–126)
Anion gap: 8 (ref 5–15)
BUN: 29 mg/dL — ABNORMAL HIGH (ref 6–20)
CO2: 22 mmol/L (ref 22–32)
Calcium: 8.8 mg/dL — ABNORMAL LOW (ref 8.9–10.3)
Chloride: 122 mmol/L — ABNORMAL HIGH (ref 98–111)
Creatinine, Ser: 0.85 mg/dL (ref 0.44–1.00)
GFR, Estimated: >60 mL/min (ref >60)
Glucose, Bld: 190 mg/dL — ABNORMAL HIGH (ref 70–99)
Potassium: 3.9 mmol/L (ref 3.5–5.1)
Sodium: 152 mmol/L — ABNORMAL HIGH (ref 135–145)
Total Bilirubin: 3.2 mg/dL — ABNORMAL HIGH (ref 0.3–1.2)
Total Protein: 5.7 g/dL — ABNORMAL LOW (ref 6.5–8.1)

## 2022-03-13 LAB — GLUCOSE, CAPILLARY
Glucose-Capillary: 159 mg/dL — ABNORMAL HIGH (ref 70–99)
Glucose-Capillary: 171 mg/dL — ABNORMAL HIGH (ref 70–99)
Glucose-Capillary: 162 mg/dL — ABNORMAL HIGH (ref 70–99)

## 2022-03-13 LAB — CBC
HCT: 27.2 % — ABNORMAL LOW (ref 36.0–46.0)
Hemoglobin: 8 g/dL — ABNORMAL LOW (ref 12.0–15.0)
MCH: 29.2 pg (ref 26.0–34.0)
MCHC: 29.4 g/dL — ABNORMAL LOW (ref 30.0–36.0)
MCV: 99.3 fL (ref 80.0–100.0)
Platelets: 101 10*3/uL — ABNORMAL LOW (ref 150–400)
RBC: 2.74 MIL/uL — ABNORMAL LOW (ref 3.87–5.11)
RDW: 19 % — ABNORMAL HIGH (ref 11.5–15.5)
WBC: 8.9 10*3/uL (ref 4.0–10.5)
nRBC: 0.2 % (ref 0.0–0.2)

## 2022-03-13 LAB — BODY FLUID CELL COUNT WITH DIFFERENTIAL
Eos, Fluid: 0 %
Lymphs, Fluid: 55 %
Monocyte-Macrophage-Serous Fluid: 38 % — ABNORMAL LOW (ref 50–90)
Neutrophil Count, Fluid: 7 % (ref 0–25)
Total Nucleated Cell Count, Fluid: 147 cu mm (ref 0–1000)

## 2022-03-13 LAB — AMMONIA: Ammonia: 46 umol/L — ABNORMAL HIGH (ref 9–35)

## 2022-03-13 LAB — PROTIME-INR
INR: 1.7 — ABNORMAL HIGH (ref 0.8–1.2)
Prothrombin Time: 19.9 seconds — ABNORMAL HIGH (ref 11.4–15.2)

## 2022-03-13 MED ORDER — FREE WATER
200.0000 mL | Status: DC
  Administered 2022-03-13 – 2022-03-14 (×6): 200 mL

## 2022-03-13 MED ORDER — LIDOCAINE HCL 1 % IJ SOLN
INTRAMUSCULAR | Status: AC
  Administered 2022-03-13: 10 mL
  Filled 2022-03-13: qty 20

## 2022-03-13 NOTE — Inpatient Diabetes Management (Signed)
Inpatient Diabetes Program Recommendations  AACE/ADA: New Consensus Statement on Inpatient Glycemic Control (2015)  Target Ranges:  Prepandial:   less than 140 mg/dL      Peak postprandial:   less than 180 mg/dL (1-2 hours)      Critically ill patients:  140 - 180 mg/dL   Lab Results  Component Value Date   GLUCAP 171 (H) 03/13/2022    Review of Glycemic Control  Latest Reference Range & Units 03/12/22 07:57 03/12/22 12:11 03/12/22 16:45 03/12/22 20:29 03/12/22 20:34 03/12/22 23:46 03/13/22 03:55 03/13/22 07:21  Glucose-Capillary 70 - 99 mg/dL 157 (H) 140 (H) 185 (H) 143 (H) 152 (H) 207 (H) 159 (H) 171 (H)   Diabetes history: None  Current orders for Inpatient glycemic control:  None  Ensure Enlive bid between meals Osmolite 55 ml/hour  Inpatient Diabetes Program Recommendations:    Glucose trends slightly high pt on Tube Feeds  -  May consider adding Novolog 0-9 units q4 hours.  Thanks,  Tama Headings RN, MSN, BC-ADM Inpatient Diabetes Coordinator Team Pager [phone] (8a-5p)

## 2022-03-13 NOTE — Progress Notes (Addendum)
Calorie Count Note  48-hour calorie count ordered.  Diet: Regular, nectar thick liquids Supplements: Ensure Plus High protein BID  Pt with extremely poor PO intake. Discussed with RN and states that pt drank a few sips of ensure this AM but stated she did not like. Refusing all food on her tray. Encouraged family to bring in food from outside the hospital to encourage better PO. Pt drinking fluids well per RN. Will trial Boost Breeze and thicken to nectar consistency and see if it is better accepted. Pt reported yesterday that appetite is poor at baseline. PO intake may not improve significantly.   Of note - cortrak tube is post-pyloric and should bot be causing poor PO intake as it is not pooling the stomach. Cortrak tube should not be removed and TF should not be held to encourage appetite unless pt is medically stable for dc otherwise.   Estimated Nutritional Needs:  Kcal:  2000-2200 Protein:  100-115 grams Fluid:  >1.8 L  2/13 Dinner: 0% intake, 615m fluid throughout the day (per RN) 2/14 Breakfast: 0%, pt had paracentesis this AM 2/14 Lunch: 0% Supplements: sips on ensure this AM - does not like  Total intake: 0 kcal (0% of minimum estimated needs)  0 protein (0% of minimum estimated needs)  INTERVENTION:  Continue current diet per SLP recommendation Continue TF via cortrak Osmolite 1.5 @ 512mh (132049m) Prosource TF 20 1x/d This will provide 2060kcal, 102g of protein, and 1006m43m free water Continue vitamin regimen for hx EtOH abuse Ensure Enlive po BID, each supplement provides 350 kcal and 20 grams of protein. When cold, ensure is nectar thick. If supplement rises to room temperature, will need to re-thicken  Continue calorie count    NUTRITION DIAGNOSIS:  Severe Malnutrition related to chronic illness (cirrhosis) as evidenced by severe fat depletion, severe muscle depletion. - remains applicable   GOAL:  Patient will meet greater than or equal to 90% of their  needs - progressing, diet in place, supplements, TF meeting 100% of needs   RachRanell Banks, LDN Clinical Dietitian RD pager # available in [facility] hours/weekend pager # available in [facility]

## 2022-03-13 NOTE — Progress Notes (Signed)
Erica Banks  [id number] DOB: [birth year] DOA: 03/01/2022 PCP: Neale Burly, MD    Brief Narrative:  42 year old with a history of anemia of chronic disease, alcoholic cirrhosis, and DVT/PE who presented to the ER with generalized malaise and fatigue and significant worsening confusion per family.  After presentation she was found to become severely lethargic and was therefore intubated in the ER for "airway protection."  Further history by family indicated the patient had been noncompliant with lactulose as an outpatient.  Consultants:  None  Goals of Care:  Code Status: Full Code   DVT prophylaxis: Subcutaneous heparin  Interim Hx: Afebrile.  Vital signs stable.  Alert and conversant but confused today.  No new complaints.  Underwent successful therapeutic paracentesis today relieving approximately 3 L of ascites.  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 -decompensated cirrhosis of the liver due to alcohol Continue rifaximin and increased dose of lactulose -status post repeat paracentesis today -continue Cipro for SBP prophylaxis -continue maintenance Lasix and Aldactone at time of discharge  Severe sepsis due to ESBL Klebsiella UTI Completed a course of ertapenem 03/07/2022 -sepsis resolved  Profound aphasia with dysphagia felt to be related to intubation/vocal cord trauma -voice slowly improving - continue tube feeds through core track - advance diet as per SLP with patient now cleared for regular diet with nectar thick liquids  Acute respiratory failure Was intubated for airway protection and not for hypoxemia -extubated 03/06/2022  Acute kidney injury Due to nausea vomiting diarrhea and hypovolemia and exacerbated by ongoing use of diuretics -creatinine has stabilized and is presently  normal  Severe malnutrition in context of chronic illness  As evidenced by severe fat depletion and severe muscle depletion  Family Communication:  Disposition: From home -requires SNF for rehab stay   Objective: Blood pressure 111/62, pulse 94, temperature 99 F (37.2 C), temperature source Oral, resp. rate 15, height 5' 6"$  (1.676 m), weight 61.1 kg, SpO2 100 %.  Intake/Output Summary (Last 24 hours) at 03/13/2022 1003 Last data filed at 03/13/2022 0600 Gross per 24 hour  Intake 5149.83 ml  Output 1300 ml  Net 3849.83 ml   Filed Weights   03/11/22 2000 03/12/22 0500 03/13/22 0500  Weight: 63.3 kg 62.3 kg 61.1 kg    Examination: General: No acute respiratory distress Lungs: Clear to auscultation bilaterally without wheezes or crackles Cardiovascular: Regular rate and rhythm without murmur gallop or rub normal S1 and S2 Abdomen: Nontender, nondistended, soft, bowel sounds positive, no rebound, minimal ascites, no appreciable mass Extremities: No significant cyanosis, clubbing, or edema bilateral lower extremities  CBC: Recent Labs  Lab 03/11/22 0711 03/12/22 0326 03/13/22 0318  WBC 11.1* 10.5 8.9  HGB 8.7* 8.8* 8.0*  HCT 28.4* 29.0* 27.2*  MCV 96.3 97.0 99.3  PLT 99* 109* [id number]*   Basic Metabolic Panel: Recent Labs  Lab 03/11/22 0711 03/12/22 0326 03/13/22 0318  NA 158* 157* 152*  K 4.0 3.9 3.9  CL 126* 125* 122*  CO2 23 22 22  $ GLUCOSE 188* 203* 190*  BUN 26* 32* 29*  CREATININE 0.84 0.84 0.85  CALCIUM 8.8* 9.0 8.8*   GFR: Estimated Creatinine Clearance: 82.4 mL/min (by C-G formula based on SCr of 0.85 mg/dL).   Scheduled Meds:  ciprofloxacin  500 mg Per Tube Q breakfast   docusate  100 mg Per Tube BID   feeding supplement  237 mL Oral BID BM   feeding supplement (PROSource TF20)  60 mL Per Tube Daily   folic acid  1 mg Per Tube Daily   furosemide  20 mg Per Tube Daily   heparin  5,000 Units Subcutaneous Q8H   lactulose  10 g Per Tube TID    multivitamin with minerals  1 tablet Per Tube Daily   mouth rinse  15 mL Mouth Rinse 4 times per day   pantoprazole (PROTONIX) IV  40 mg Intravenous Q24H   rifaximin  550 mg Per Tube BID   spironolactone  50 mg Per Tube Daily   thiamine  100 mg Oral Daily   zinc sulfate  220 mg Per Tube Daily   Continuous Infusions:  sodium chloride Stopped (03/04/22 1519)   feeding supplement (OSMOLITE 1.5 CAL) 1,000 mL (03/13/22 0123)     LOS: 12 days   Cherene Altes, MD [facility] Office  [phone] Pager - Text Page per Shea Evans  If 7PM-7AM, please contact night-coverage per Amion 03/13/2022, 10:03 AM

## 2022-03-13 NOTE — Procedures (Signed)
PROCEDURE SUMMARY:  Successful image-guided paracentesis from the right lower abdomen.  Yielded 3.1 liters of yellow fluid.  No immediate complications.  EBL = trace. Patient tolerated well.   Specimen was sent for labs.  Please see imaging section of Epic for full dictation.   Lura Em PA-C 03/13/2022 12:12 PM

## 2022-03-14 DIAGNOSIS — K7682 Hepatic encephalopathy: Secondary | ICD-10-CM | POA: Diagnosis not present

## 2022-03-14 LAB — CBC
HCT: 24.7 % — ABNORMAL LOW (ref 36.0–46.0)
Hemoglobin: 7.7 g/dL — ABNORMAL LOW (ref 12.0–15.0)
MCH: 30 pg (ref 26.0–34.0)
MCHC: 31.2 g/dL (ref 30.0–36.0)
MCV: 96.1 fL (ref 80.0–100.0)
Platelets: 91 10*3/uL — ABNORMAL LOW (ref 150–400)
RBC: 2.57 MIL/uL — ABNORMAL LOW (ref 3.87–5.11)
RDW: 18.6 % — ABNORMAL HIGH (ref 11.5–15.5)
WBC: 10 10*3/uL (ref 4.0–10.5)
nRBC: 0 % (ref 0.0–0.2)

## 2022-03-14 LAB — COMPREHENSIVE METABOLIC PANEL
ALT: 66 U/L — ABNORMAL HIGH (ref 0–44)
AST: 101 U/L — ABNORMAL HIGH (ref 15–41)
Albumin: 3.3 g/dL — ABNORMAL LOW (ref 3.5–5.0)
Alkaline Phosphatase: 81 U/L (ref 38–126)
Anion gap: 9 (ref 5–15)
BUN: 26 mg/dL — ABNORMAL HIGH (ref 6–20)
CO2: 18 mmol/L — ABNORMAL LOW (ref 22–32)
Calcium: 8.4 mg/dL — ABNORMAL LOW (ref 8.9–10.3)
Chloride: 118 mmol/L — ABNORMAL HIGH (ref 98–111)
Creatinine, Ser: 0.84 mg/dL (ref 0.44–1.00)
GFR, Estimated: >60 mL/min (ref >60)
Glucose, Bld: 174 mg/dL — ABNORMAL HIGH (ref 70–99)
Potassium: 4.2 mmol/L (ref 3.5–5.1)
Sodium: 145 mmol/L (ref 135–145)
Total Bilirubin: 3 mg/dL — ABNORMAL HIGH (ref 0.3–1.2)
Total Protein: 5.7 g/dL — ABNORMAL LOW (ref 6.5–8.1)

## 2022-03-14 LAB — GLUCOSE, CAPILLARY
Glucose-Capillary: 220 mg/dL — ABNORMAL HIGH (ref 70–99)
Glucose-Capillary: 271 mg/dL — ABNORMAL HIGH (ref 70–99)
Glucose-Capillary: 181 mg/dL — ABNORMAL HIGH (ref 70–99)
Glucose-Capillary: 188 mg/dL — ABNORMAL HIGH (ref 70–99)
Glucose-Capillary: 180 mg/dL — ABNORMAL HIGH (ref 70–99)

## 2022-03-14 LAB — CYTOLOGY - NON PAP

## 2022-03-14 LAB — PROTIME-INR
INR: 1.7 — ABNORMAL HIGH (ref 0.8–1.2)
Prothrombin Time: 19.8 seconds — ABNORMAL HIGH (ref 11.4–15.2)

## 2022-03-14 LAB — AMMONIA: Ammonia: 66 umol/L — ABNORMAL HIGH (ref 9–35)

## 2022-03-14 MED ORDER — FREE WATER: 100.0000 mL | Status: DC

## 2022-03-14 MED ORDER — BOOST / RESOURCE BREEZE PO LIQD CUSTOM
1.0000 | Freq: Three times a day (TID) | ORAL | Status: DC
  Administered 2022-03-14 – 2022-03-17 (×8): 1 via ORAL

## 2022-03-14 MED ORDER — FREE WATER
100.0000 mL | Freq: Four times a day (QID) | Status: DC
  Administered 2022-03-14 – 2022-03-15 (×6): 100 mL

## 2022-03-14 MED ORDER — PANTOPRAZOLE SODIUM 40 MG PO TBEC
40.0000 mg | DELAYED_RELEASE_TABLET | Freq: Two times a day (BID) | ORAL | Status: DC
  Administered 2022-03-14 – 2022-03-18 (×8): 40 mg via ORAL
  Filled 2022-03-14 (×8): qty 1

## 2022-03-14 NOTE — Progress Notes (Signed)
Calorie Count Note  48-hour calorie count ordered.  Diet: Regular, nectar thick liquids Supplements: Boost Breeze  Pt with continue refusal of all solid foods. Has only consumed liquids over the last 24 hours. RN reports that pt is agreeable to the boost breeze supplements and has consumed 1 so far. Intake is not improving. Pt has no desire to eat.  Estimated Nutritional Needs:  Kcal:  2000-2200 Protein:  100-115 grams Fluid:  >1.8 L  Total intake of the previous 24 hours: 1 sweet tea, 1 boost breeze 310 kcal, 10g of protein  Total intake: 310 kcal 16% of minimum estimated needs)  10 protein (10% of minimum estimated needs)  INTERVENTION:  Continue current diet per SLP recommendation Continue TF via cortrak Osmolite 1.5 @ 63m/h (13269md) Prosource TF 20 1x/d This will provide 2060kcal, 102g of protein, and 100621mf free water Continue vitamin regimen for hx EtOH abuse Boost Breeze po TID, each supplement provides 250 kcal and 9 grams of protein. Supplement will need to be thickened to the correct consistency    NUTRITION DIAGNOSIS:  Severe Malnutrition related to chronic illness (cirrhosis) as evidenced by severe fat depletion, severe muscle depletion. - remains applicable   GOAL:  Patient will meet greater than or equal to 90% of their needs - progressing, diet in place, supplements, TF meeting 100% of needs   RacRanell PatrickD, LDN Clinical Dietitian RD pager # available in [facility] Incfter hours/weekend pager # available in [facility]

## 2022-03-14 NOTE — Progress Notes (Signed)
Physical Therapy Treatment Patient Details Name: Erica Banks MRN: [id number] DOB: 12-23-1980 Today's Date: 03/14/2022   History of Present Illness Erica Banks is a 42 y.o. 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  Intubated for airway protection on 03/01/22 until 03/07/22. Patient was recently discharged from [facility] on 02/25/2022 for similar episode of encephalopathy secondary to cirrhosis and worsening ascites. Pt with PHM significant for but not limited to:  chronic anemia of chronic disease, alcoholic cirrhosis, neuropathy, esophageal varices, history of DVT/PE.    PT Comments    Patient remains with flat affect and delayed responses. Requires up to mod assist for bed mobility and +2 assist for OOB to chair and walking x 5 ft. Discharge plan remains appropriate.    Recommendations for follow up therapy are one component of a multi-disciplinary discharge planning process, led by the attending physician.  Recommendations may be updated based on patient status, additional functional criteria and insurance authorization.  Follow Up Recommendations  Skilled nursing-short term rehab (<3 hours/day) Can patient physically be transported by private vehicle: No   Assistance Recommended at Discharge Frequent or constant Supervision/Assistance  Patient can return home with the following A lot of help with bathing/dressing/bathroom;Assistance with cooking/housework;Assistance with feeding;Direct supervision/assist for medications management;Direct supervision/assist for financial management;Assist for transportation;Help with stairs or ramp for entrance;Two people to help with walking and/or transfers   Equipment Recommendations  Rolling walker (2 wheels);Wheelchair (measurements PT);Wheelchair cushion (measurements PT)    Recommendations for Other Services       Precautions / Restrictions Precautions Precautions: Fall Precaution Comments: rectal  tube Restrictions Weight Bearing Restrictions: No     Mobility  Bed Mobility Overal bed mobility: Needs Assistance Bed Mobility: Rolling, Sidelying to Sit Rolling: Mod assist Sidelying to sit: HOB elevated, Mod assist       General bed mobility comments: max cues for how to roll with tactile cues given; once LEs assisted over EOB, pt able to push torso up with less assist    Transfers Overall transfer level: Needs assistance Equipment used: 2 person hand held assist Transfers: Sit to/from Stand, Bed to chair/wheelchair/BSC Sit to Stand: +2 physical assistance, Min assist   Step pivot transfers: Min assist, +2 physical assistance       General transfer comment: Better ability to lift/advance LEs to take small steps and pivot to chair    Ambulation/Gait Ambulation/Gait assistance: Mod assist, +2 physical assistance Gait Distance (Feet): 10 Feet (5 fwd, 5 backward) Assistive device: 2 person hand held assist Gait Pattern/deviations: Step-to pattern, Decreased stride length, Decreased dorsiflexion - right, Decreased dorsiflexion - left       General Gait Details: very unsteady and requires bil UE support   Stairs             Wheelchair Mobility    Modified Rankin (Stroke Patients Only)       Balance Overall balance assessment: Needs assistance Sitting-balance support: Feet supported, No upper extremity supported Sitting balance-Leahy Scale: Fair Sitting balance - Comments: one person assist to maintain balance with tendency to lose balance to left>rt   Standing balance support: Bilateral upper extremity supported Standing balance-Leahy Scale: Poor                              Cognition Arousal/Alertness: Awake/alert Behavior During Therapy: Flat affect Overall Cognitive Status: No family/caregiver present to determine baseline cognitive functioning Area of Impairment: Following commands, Attention, Memory,  Safety/judgement, Awareness,  Problem solving                   Current Attention Level: Sustained (internally distracted at times) Memory: Decreased short-term memory, Decreased recall of precautions Following Commands: Follows one step commands inconsistently, Follows one step commands with increased time Safety/Judgement: Decreased awareness of safety, Decreased awareness of deficits Awareness: Intellectual Problem Solving: Slow processing, Decreased initiation, Requires verbal cues, Requires tactile cues General Comments: loses track of task and requires repeated cues to return to task; thinks she is moving well enough to get into the shower despite needing 2 person assist to stand        Exercises      General Comments General comments (skin integrity, edema, etc.): Pt now has a heel ulcer on rt heel (posterior heel). No reports of pain or antalgic gait noted.      Pertinent Vitals/Pain Pain Assessment Pain Assessment: Faces Faces Pain Scale: Hurts even more Pain Location: rectum; rt heel Pain Descriptors / Indicators: Discomfort, Grimacing Pain Intervention(s): Limited activity within patient's tolerance, Repositioned, Monitored during session    Home Living                          Prior Function            PT Goals (current goals can now be found in the care plan section) Acute Rehab PT Goals Patient Stated Goal: Wants to take a shower Time For Goal Achievement: 03/23/22 Potential to Achieve Goals: Fair Progress towards PT goals: Progressing toward goals    Frequency    Min 3X/week      PT Plan Current plan remains appropriate    Co-evaluation              AM-PAC PT "6 Clicks" Mobility   Outcome Measure  Help needed turning from your back to your side while in a flat bed without using bedrails?: A Lot Help needed moving from lying on your back to sitting on the side of a flat bed without using bedrails?: A Lot Help needed moving to and from a bed to a chair  (including a wheelchair)?: Total Help needed standing up from a chair using your arms (e.g., wheelchair or bedside chair)?: Total Help needed to walk in hospital room?: Total Help needed climbing 3-5 steps with a railing? : Total 6 Click Score: 8    End of Session Equipment Utilized During Treatment: Gait belt Activity Tolerance: Patient tolerated treatment well Patient left: with call bell/phone within reach;in chair;with chair alarm set;with nursing/sitter in room Nurse Communication: Mobility status PT Visit Diagnosis: Muscle weakness (generalized) (M62.81)     Time: [id number] PT Time Calculation (min) (ACUTE ONLY): 30 min  Charges:  $Gait Training: 8-22 mins $Therapeutic Activity: 8-22 mins                      Arby Barrette, PT Acute Rehabilitation Services  Office [phone]    Rexanne Mano 03/14/2022, 3:38 PM

## 2022-03-14 NOTE — Plan of Care (Signed)

## 2022-03-14 NOTE — Progress Notes (Signed)
Speech Language Pathology Treatment: Dysphagia  Patient Details Name: Erica Banks MRN: [id number] DOB: [birth year] Today's Date: 03/14/2022 Time: [id number] SLP Time Calculation (min) (ACUTE ONLY): 18 min  Assessment / Plan / Recommendation Clinical Impression  Pt reports N/V and declines POs this date, but was agreeable to swallowing exercises.  Swallow exercises introduced to pt and completed as noted below.  Pt accepted small amounts of water for swallow exercises with good tolerance.  No clinical s/s of aspiration observed.  With straw sips of liquid there was immediate cough with expectoration of secretions.  Pt with audible penetration on FEES.  It is difficult to tell if thin liquid trials are still resulting in laryngeal penetration, or if trials of thin liquids mobilized secretions as observed on FEES as well.  Pt may benefit from repeant instrumental swallow assessment.  Consider MBSS early next week to determine if pt can advance liquid consistency.  Recommend continuing regular texture diet with nectar thick liquids  Swallow Exercises Effortful Swallow Reps: 10  Effort: n/a Accuracy: n/a  CTAR Reps: 10 Effort: Good Accuracy: Good Comments: appeared to fatigue across trials  Mendelsson Reps: 5 Effort: Good Accuracy: Fair Comment: Inconsistent execution  Glottal closure Reps: 3 Effort: Good Accuracy: Unable to assess Comment: Pt complains of dizziness after several reps, exercise discontinued.   HPI HPI: Erica Banks is a 42 yo F with Etoh Cirrhosis/ascites and h/o hep encephalopathy d/c from [facility] 1/29 to GI clinic 2/2 am to schedule paracentesis but woke up confused > referred from GI to ED. Lethargic, risk of aspiration and need for rx with lactulose so electively intubated.  ETT 2/2-2/8. Per RD note husbad reports "pt has a very poor appetite at baseline. States that she only eats 1 meal each day and that she has lost a significant amount of weight." Pt with  history of alcoholic cirrhosis complicated by recurrent ascites, SBP on daily Cipro for prophylaxis, hepatic encephalopathy, esophageal varices.  Also with history of IDA following with hematology with prescription iron infusions, GERD, PE, DVT, neuropathy.      SLP Plan  Continue with current plan of care      Recommendations for follow up therapy are one component of a multi-disciplinary discharge planning process, led by the attending physician.  Recommendations may be updated based on patient status, additional functional criteria and insurance authorization.    Recommendations  Diet recommendations: Regular;Nectar-thick liquid Liquids provided via: Straw Medication Administration: Whole meds with liquid Supervision: Staff to assist with self feeding Compensations: Slow rate;Small sips/bites;Minimize environmental distractions Postural Changes and/or Swallow Maneuvers: Seated upright 90 degrees                Oral Care Recommendations: Oral care prior to ice chip/H20;Staff/trained caregiver to provide oral care;Oral care before and after PO;Oral care BID Follow Up Recommendations:  (Continue ST at next level of care) SLP Visit Diagnosis: Dysphagia, pharyngeal phase (R13.13) Plan: Continue with current plan of care           Celedonio Savage, [place], [place] Office: [phone] 03/14/2022, 11:12 AM

## 2022-03-14 NOTE — Progress Notes (Signed)
Erica Banks  [id number] DOB: 1980/02/01 DOA: 03/01/2022 PCP: Neale Burly, MD    Brief Narrative:  42 year old with a history of anemia of chronic disease, alcoholic cirrhosis, and DVT/PE who presented to the ER with generalized malaise and fatigue and significant worsening confusion per family.  After presentation she was found to become severely lethargic and was therefore intubated in the ER for "airway protection."  Further history by family indicated the patient had been noncompliant with lactulose as an outpatient.  Consultants:  None  Goals of Care:  Code Status: Full Code   DVT prophylaxis: Subcutaneous heparin  Interim Hx: No acute events reported overnight.  Afebrile.  Vital signs stable.  Sodium has normalized.  Is awake and conversant today.  Complains of nausea.  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 -decompensated cirrhosis of the liver due to alcohol Continue rifaximin and increased dose of lactulose -status post repeat paracentesis [id number] without complication-continue Cipro for SBP prophylaxis - resume maintenance Lasix and Aldactone and short course -acute decompensation in mental status appears to simply have been due to failure to comply with lactulose  Severe sepsis due to ESBL Klebsiella UTI Completed a course of ertapenem 03/07/2022 - sepsis resolved  Profound aphasia with dysphagia felt to be related to intubation/vocal cord trauma -voice slowly improving - continue tube feeds through core track until calorie count confirms intake is sufficient - advance diet as per SLP with patient now cleared for regular diet with nectar thick liquids  Acute respiratory failure Was intubated for airway protection and not for hypoxemia - extubated 03/06/2022  Acute kidney injury Due to nausea  vomiting diarrhea and hypovolemia and exacerbated by ongoing use of diuretics - creatinine has stabilized and is presently normal  Severe malnutrition in context of chronic illness  As evidenced by severe fat depletion and severe muscle depletion  Family Communication:  Disposition: From home - requires SNF for rehab stay   Objective: Blood pressure 120/70, pulse (!) 104, temperature 99.4 F (37.4 C), temperature source Axillary, resp. rate 19, height 5' 6"$  (1.676 m), weight 61.1 kg, SpO2 99 %.  Intake/Output Summary (Last 24 hours) at 03/14/2022 0859 Last data filed at 03/14/2022 0251 Gross per 24 hour  Intake 1052.33 ml  Output 2550 ml  Net -1497.67 ml    Filed Weights   03/12/22 0500 03/13/22 0500 03/14/22 0452  Weight: 62.3 kg 61.1 kg 61.1 kg    Examination: General: No acute respiratory distress Lungs: Clear to auscultation bilaterally without wheeze Cardiovascular: Regular rate and rhythm without murmur  Abdomen: Mildly protuberant, soft, bowel sounds positive, no rebound Extremities: No significant edema bilateral lower extremities  CBC: Recent Labs  Lab 03/12/22 0326 03/13/22 0318 03/14/22 0626  WBC 10.5 8.9 10.0  HGB 8.8* 8.0* 7.7*  HCT 29.0* 27.2* 24.7*  MCV 97.0 99.3 96.1  PLT 109* 101* 91*    Basic Metabolic Panel: Recent Labs  Lab 03/12/22 0326 03/13/22 0318 03/14/22 0626  NA 157* 152* 145  K 3.9 3.9 4.2  CL 125* 122* 118*  CO2 22 22 18*  GLUCOSE 203* 190* 174*  BUN 32* 29* 26*  CREATININE 0.84 0.85 0.84  CALCIUM 9.0 8.8* 8.4*    GFR: Estimated Creatinine Clearance: 83.3 mL/min (by C-G formula based on SCr of 0.84 mg/dL).   Scheduled Meds:  ciprofloxacin  500 mg Per  Tube Q breakfast   docusate  100 mg Per Tube BID   feeding supplement  237 mL Oral BID BM   feeding supplement (PROSource TF20)  60 mL Per Tube Daily   folic acid  1 mg Per Tube Daily   free water  200 mL Per Tube Q4H   heparin  5,000 Units Subcutaneous Q8H   lactulose   10 g Per Tube TID   multivitamin with minerals  1 tablet Per Tube Daily   mouth rinse  15 mL Mouth Rinse 4 times per day   pantoprazole (PROTONIX) IV  40 mg Intravenous Q24H   rifaximin  550 mg Per Tube BID   thiamine  100 mg Oral Daily   zinc sulfate  220 mg Per Tube Daily   Continuous Infusions:  sodium chloride Stopped (03/04/22 1519)   feeding supplement (OSMOLITE 1.5 CAL) 55 mL/hr at 03/14/22 0251     LOS: 13 days   Cherene Altes, MD [facility] Office  [phone] Pager - Text Page per Shea Evans  If 7PM-7AM, please contact night-coverage per Amion 03/14/2022, 8:59 AM

## 2022-03-15 DIAGNOSIS — K7682 Hepatic encephalopathy: Secondary | ICD-10-CM | POA: Diagnosis not present

## 2022-03-15 LAB — CBC
HCT: 25.1 % — ABNORMAL LOW (ref 36.0–46.0)
HCT: 26.3 % — ABNORMAL LOW (ref 36.0–46.0)
Hemoglobin: 7.8 g/dL — ABNORMAL LOW (ref 12.0–15.0)
Hemoglobin: 8.2 g/dL — ABNORMAL LOW (ref 12.0–15.0)
MCH: 29.9 pg (ref 26.0–34.0)
MCH: 30 pg (ref 26.0–34.0)
MCHC: 31.1 g/dL (ref 30.0–36.0)
MCHC: 31.2 g/dL (ref 30.0–36.0)
MCV: 96.2 fL (ref 80.0–100.0)
MCV: 96.3 fL (ref 80.0–100.0)
Platelets: 102 10*3/uL — ABNORMAL LOW (ref 150–400)
Platelets: 117 10*3/uL — ABNORMAL LOW (ref 150–400)
RBC: 2.61 MIL/uL — ABNORMAL LOW (ref 3.87–5.11)
RBC: 2.73 MIL/uL — ABNORMAL LOW (ref 3.87–5.11)
RDW: 19.3 % — ABNORMAL HIGH (ref 11.5–15.5)
RDW: 20 % — ABNORMAL HIGH (ref 11.5–15.5)
WBC: 8.9 10*3/uL (ref 4.0–10.5)
WBC: 9.9 10*3/uL (ref 4.0–10.5)
nRBC: 0 % (ref 0.0–0.2)
nRBC: 0 % (ref 0.0–0.2)

## 2022-03-15 LAB — GLUCOSE, CAPILLARY
Glucose-Capillary: 214 mg/dL — ABNORMAL HIGH (ref 70–99)
Glucose-Capillary: 217 mg/dL — ABNORMAL HIGH (ref 70–99)
Glucose-Capillary: 252 mg/dL — ABNORMAL HIGH (ref 70–99)

## 2022-03-15 LAB — BASIC METABOLIC PANEL
Anion gap: 9 (ref 5–15)
BUN: 31 mg/dL — ABNORMAL HIGH (ref 6–20)
CO2: 19 mmol/L — ABNORMAL LOW (ref 22–32)
Calcium: 8.4 mg/dL — ABNORMAL LOW (ref 8.9–10.3)
Chloride: 111 mmol/L (ref 98–111)
Creatinine, Ser: 0.82 mg/dL (ref 0.44–1.00)
GFR, Estimated: >60 mL/min (ref >60)
Glucose, Bld: 298 mg/dL — ABNORMAL HIGH (ref 70–99)
Potassium: 4.4 mmol/L (ref 3.5–5.1)
Sodium: 139 mmol/L (ref 135–145)

## 2022-03-15 LAB — MAGNESIUM: Magnesium: 1.9 mg/dL (ref 1.7–2.4)

## 2022-03-15 LAB — PATHOLOGIST SMEAR REVIEW

## 2022-03-15 LAB — AMMONIA: Ammonia: 84 umol/L — ABNORMAL HIGH (ref 9–35)

## 2022-03-15 LAB — PHOSPHORUS: Phosphorus: 3.1 mg/dL (ref 2.5–4.6)

## 2022-03-15 MED ORDER — ZINC SULFATE 220 (50 ZN) MG PO CAPS
220.0000 mg | ORAL_CAPSULE | Freq: Every day | ORAL | Status: DC
  Administered 2022-03-15 – 2022-03-18 (×4): 220 mg via ORAL
  Filled 2022-03-15 (×3): qty 1

## 2022-03-15 MED ORDER — FUROSEMIDE 40 MG PO TABS
40.0000 mg | ORAL_TABLET | Freq: Every day | ORAL | Status: DC
  Administered 2022-03-15 – 2022-03-18 (×4): 40 mg via ORAL
  Filled 2022-03-15 (×5): qty 1

## 2022-03-15 MED ORDER — ADULT MULTIVITAMIN W/MINERALS CH
1.0000 | ORAL_TABLET | Freq: Every day | ORAL | Status: DC
  Administered 2022-03-16 – 2022-03-18 (×3): 1 via ORAL
  Filled 2022-03-15 (×3): qty 1

## 2022-03-15 MED ORDER — PROCHLORPERAZINE EDISYLATE 10 MG/2ML IJ SOLN
5.0000 mg | Freq: Once | INTRAMUSCULAR | Status: AC | PRN
  Administered 2022-03-15: 5 mg via INTRAVENOUS
  Filled 2022-03-15: qty 2

## 2022-03-15 MED ORDER — CIPROFLOXACIN HCL 500 MG PO TABS
500.0000 mg | ORAL_TABLET | Freq: Every day | ORAL | Status: DC
  Administered 2022-03-16 – 2022-03-18 (×3): 500 mg via ORAL
  Filled 2022-03-15 (×3): qty 1

## 2022-03-15 MED ORDER — RIFAXIMIN 550 MG PO TABS
550.0000 mg | ORAL_TABLET | Freq: Two times a day (BID) | ORAL | Status: DC
  Administered 2022-03-15 – 2022-03-18 (×6): 550 mg via ORAL
  Filled 2022-03-15 (×6): qty 1

## 2022-03-15 MED ORDER — OXYCODONE HCL 5 MG PO TABS: 5.0000 mg | ORAL_TABLET | Freq: Once | ORAL | Status: DC | PRN

## 2022-03-15 MED ORDER — NALOXONE HCL 0.4 MG/ML IJ SOLN: 0.4000 mg | INTRAMUSCULAR | Status: DC | PRN

## 2022-03-15 MED ORDER — SPIRONOLACTONE 25 MG PO TABS
25.0000 mg | ORAL_TABLET | Freq: Every day | ORAL | Status: DC
  Administered 2022-03-15 – 2022-03-18 (×4): 25 mg via ORAL
  Filled 2022-03-15 (×4): qty 1

## 2022-03-15 MED ORDER — FOLIC ACID 1 MG PO TABS
1.0000 mg | ORAL_TABLET | Freq: Every day | ORAL | Status: DC
  Administered 2022-03-15 – 2022-03-18 (×4): 1 mg via ORAL
  Filled 2022-03-15 (×4): qty 1

## 2022-03-15 MED ORDER — DRONABINOL 2.5 MG PO CAPS
2.5000 mg | ORAL_CAPSULE | Freq: Two times a day (BID) | ORAL | Status: DC
  Administered 2022-03-15 – 2022-03-18 (×7): 2.5 mg via ORAL
  Filled 2022-03-15 (×7): qty 1

## 2022-03-15 MED ORDER — LACTULOSE 10 GM/15ML PO SOLN
10.0000 g | Freq: Three times a day (TID) | ORAL | Status: DC
  Administered 2022-03-15 – 2022-03-18 (×8): 10 g via ORAL
  Filled 2022-03-15 (×8): qty 30

## 2022-03-15 MED ORDER — OXYCODONE HCL 5 MG PO TABS
5.0000 mg | ORAL_TABLET | Freq: Once | ORAL | Status: AC | PRN
  Administered 2022-03-15: 5 mg via ORAL
  Filled 2022-03-15: qty 1

## 2022-03-15 MED ORDER — SPIRONOLACTONE 25 MG PO TABS: 100.0000 mg | ORAL_TABLET | Freq: Every day | ORAL | Status: DC

## 2022-03-15 NOTE — TOC Progression Note (Signed)
Transition of Care [facility]) - Progression Note    Patient Details  Name: Erica Banks MRN: [id number] Date of Birth: 01/24/1981  Transition of Care [facility]) CM/SW [place], [place] Phone Number: 03/15/2022, 3:59 PM  Clinical Narrative:   CSW contacted by SNF referral that patient's Medicaid does not have a SNF benefit, unable to accept. CSW asked CIR to see if patient may be a candidate; otherwise, will need to go home. CSW to follow.    Expected Discharge Plan: [place] Barriers to Discharge: Continued Medical Work up, Ship broker  Expected Discharge Plan and [place] Choice: [place] Living arrangements for the past 2 months: [place] Determinants of Health (SDOH) Interventions [place]: No Food Insecurity (03/08/2022)  Housing: Low Risk  (03/08/2022)  Transportation Needs: No Transportation Needs (03/08/2022)  Utilities: Not At Risk (03/08/2022)  Tobacco Use: High Risk (03/13/2022)    Readmission Risk Interventions    02/14/2022    2:11 PM 12/19/2021    8:54 AM  Readmission Risk Prevention Plan  Transportation Screening Complete Complete  HRI or Home Care Consult Complete Complete  Social Work Consult for [place] Planning/Counseling Complete Complete  Palliative Care Screening Not Applicable Not Applicable  Medication Review Press photographer) Complete Complete

## 2022-03-15 NOTE — Progress Notes (Signed)
Patient calling multiple times during the night, asking to be cleaned. Noticed to have stool leaking, surrounding her rectal tube. Ballon adjusted and inflated multiple occasions, still leaking stool intermittently. No issues related rectal tube are noticed. Unknown cause for the leak, tube intact. Patient c/o discomfort due to hemorrhoids every time her rectal tube is manipulated and she is cleaned. PRN order for Preparation H was obtained, medication administered to the patient.

## 2022-03-15 NOTE — Progress Notes (Signed)
Mobility Specialist: Progress Note   03/15/22 1037  Mobility  Activity Ambulated with assistance in room  Level of Assistance +2 (takes two people)  Assistive Device Other (Comment) (HHA)  Distance Ambulated (ft) 20 ft (5'+15')  Activity Response Tolerated well  Mobility Referral Yes  $Mobility charge 1 Mobility   Pt received in the bed with NT present in the room. Pt assisted with rolling for pericare then agreeable to mobility. MinA +2 HHA with OT to get to the sink. Able to stand and wash her hands with minA for balance. Stopped x1 for seated break secondary to fatigue and ambulated to the chair, no c/o throughout. Pt is in the chair with OT and NT present in the room.   [place] Marquie Aderhold Mobility Specialist Please contact via SecureChat or Rehab office at [phone]

## 2022-03-15 NOTE — Plan of Care (Signed)

## 2022-03-15 NOTE — Progress Notes (Signed)
Occupational Therapy Treatment Patient Details Name: Erica Banks MRN: [id number] DOB: 09-22-1980 Today's Date: 03/15/2022   History of present illness Erica Banks is a 42 y.o. 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  Intubated for airway protection on 03/01/22 until 03/07/22. Patient was recently discharged from [facility] on 02/25/2022 for similar episode of encephalopathy secondary to cirrhosis and worsening ascites. Pt with PHM significant for but not limited to:  chronic anemia of chronic disease, alcoholic cirrhosis, neuropathy, esophageal varices, history of DVT/PE.   OT comments  Patient demonstrated good gains this treatment session. Patient able to ambulate to sink from EOB with min assist of 2 HHA and perform hand and face hygiene with assistance for balance and frequent cues. Patient ambulated to recliner and able to perform bathing legs and applying lotion with supervision for balance and safety. Patient would benefit from continued OT/PT treatment in SNF setting to further address bathing, dressing, and functional transfers to increase independence and decrease burden of care.    Recommendations for follow up therapy are one component of a multi-disciplinary discharge planning process, led by the attending physician.  Recommendations may be updated based on patient status, additional functional criteria and insurance authorization.    Follow Up Recommendations  Skilled nursing-short term rehab (<3 hours/day)     Assistance Recommended at Discharge Frequent or constant Supervision/Assistance  Patient can return home with the following  A lot of help with bathing/dressing/bathroom;Two people to help with walking and/or transfers;Direct supervision/assist for medications management;Assistance with cooking/housework;Assist for transportation   Equipment Recommendations  Other (comment) (TBD next venue)    Recommendations for Other Services       Precautions / Restrictions Precautions Precautions: Fall Precaution Comments: rectal tube Restrictions Weight Bearing Restrictions: No       Mobility Bed Mobility Overal bed mobility: Needs Assistance Bed Mobility: Rolling, Sidelying to Sit Rolling: Mod assist Sidelying to sit: HOB elevated, Mod assist       General bed mobility comments: cues and increased time with increase patient participation    Transfers Overall transfer level: Needs assistance Equipment used: 2 person hand held assist Transfers: Sit to/from Stand, Bed to chair/wheelchair/BSC Sit to Stand: +2 physical assistance, Min assist     Step pivot transfers: Min assist, +2 physical assistance     General transfer comment: cues to lift LLE > right     Balance Overall balance assessment: Needs assistance Sitting-balance support: Feet supported, No upper extremity supported Sitting balance-Leahy Scale: Fair Sitting balance - Comments: one person assist on EOB   Standing balance support: Bilateral upper extremity supported Standing balance-Leahy Scale: Poor Standing balance comment: reliant on external support for balance                           ADL either performed or assessed with clinical judgement   ADL Overall ADL's : Needs assistance/impaired     Grooming: Wash/dry hands;Wash/dry face;Minimal assistance;Standing Grooming Details (indicate cue type and reason): stood at sink with min assist of 2 for balance     Lower Body Bathing: Moderate assistance;Maximal assistance;Sitting/lateral leans Lower Body Bathing Details (indicate cue type and reason): periarea cleaning performed at bed level. Patient able to bathed lower legs and apply lotions while seated in recliner Upper Body Dressing : Maximal assistance;Sitting Upper Body Dressing Details (indicate cue type and reason): changed gown  General ADL Comments: able to stand at sink on this date for grooming  and participated in LB bathing seated in recliner    Extremity/Trunk Assessment              Vision       Perception     Praxis      Cognition Arousal/Alertness: Awake/alert Behavior During Therapy: Flat affect Overall Cognitive Status: No family/caregiver present to determine baseline cognitive functioning Area of Impairment: Following commands, Attention, Memory, Safety/judgement, Awareness, Problem solving                   Current Attention Level: Sustained Memory: Decreased short-term memory, Decreased recall of precautions Following Commands: Follows one step commands inconsistently, Follows one step commands with increased time Safety/Judgement: Decreased awareness of safety, Decreased awareness of deficits Awareness: Intellectual Problem Solving: Slow processing, Decreased initiation, Requires verbal cues, Requires tactile cues General Comments: tearful at one point in session regarding her son, patient was redirected. Improvement with following directions        Exercises      Shoulder Instructions       General Comments      Pertinent Vitals/ Pain       Pain Assessment Pain Assessment: Faces Faces Pain Scale: No hurt Pain Intervention(s): Monitored during session  Home Living                                          Prior Functioning/Environment              Frequency  Min 2X/week        Progress Toward Goals  OT Goals(current goals can now be found in the care plan section)  Progress towards OT goals: Progressing toward goals  Acute Rehab OT Goals Patient Stated Goal: get better OT Goal Formulation: With patient Time For Goal Achievement: 03/23/22 Potential to Achieve Goals: Fair ADL Goals Pt Will Perform Grooming: with max assist;with mod assist;sitting Additional ADL Goal #1: Pt will tolerate sitting EOB x 5-10 minutes with mod - min A for balance/support during grooming tasks Additional ADL Goal #2:  Pt will tolerate AAROM of B UEs in all planes to decrease risk of joint pain and stiffness  Plan Discharge plan remains appropriate    Co-evaluation                 AM-PAC OT "6 Clicks" Daily Activity     Outcome Measure   Help from another person eating meals?: Total Help from another person taking care of personal grooming?: A Lot Help from another person toileting, which includes using toliet, bedpan, or urinal?: Total Help from another person bathing (including washing, rinsing, drying)?: A Lot Help from another person to put on and taking off regular upper body clothing?: A Lot Help from another person to put on and taking off regular lower body clothing?: A Lot 6 Click Score: 10    End of Session Equipment Utilized During Treatment: Gait belt  OT Visit Diagnosis: Other abnormalities of gait and mobility (R26.89);Muscle weakness (generalized) (M62.81);Other symptoms and signs involving cognitive function   Activity Tolerance Patient tolerated treatment well   Patient Left in chair;with call bell/phone within reach;with chair alarm set   Nurse Communication Mobility status        Time: 1010-1037 OT Time Calculation (min): 27 min  Charges: OT General Charges $OT Visit: 1  Visit OT Treatments $Self Care/Home Management : 23-37 mins  Lodema Hong, [place]  Office [place] 03/15/2022, 12:33 PM

## 2022-03-15 NOTE — Progress Notes (Signed)
Inpatient Rehab Admissions Coordinator Note:   Per therapy recommendations patient was screened for CIR candidacy by Michel Santee, PT. At this time, pt appears to be a potential candidate for CIR. I will place an order for rehab consult for full assessment, per our protocol.  Please contact me any with questions.Shann Medal, PT, DPT [phone] 03/15/22 3:59 PM

## 2022-03-15 NOTE — Progress Notes (Signed)
Erica Banks  [id number] DOB: 07/26/1980 DOA: 03/01/2022 PCP: Neale Burly, MD    Brief Narrative:  42 year old with a history of anemia of chronic disease, alcoholic cirrhosis, and DVT/PE who presented to the ER with generalized malaise and fatigue and significant worsening confusion per family.  After presentation she was found to become severely lethargic and was therefore intubated in the ER for "airway protection."  Further history by family indicated the patient had been noncompliant with lactulose as an outpatient.  Consultants:  None  Goals of Care:  Code Status: Full Code   DVT prophylaxis: Subcutaneous heparin  Interim Hx: Afebrile.  Vital signs stable.  Patient continues to refuse to eat to any significant extent reporting that she simply has a very poor appetite.  I have counseled her on the absolute need to eat and explained that she will only continue to get worse if she does not do so.  We discussed the use of an appetite stimulant which she has agreed to.  Assessment & Plan:  Free water deficit -hypovolemic hyponatremia Sodium has normalized with free water administration - resume diuretic today - follow Na/lytes intermittently   Acute on chronic hepatic encephalopathy -decompensated cirrhosis of the liver due to alcohol Continue rifaximin and increased dose of lactulose -status post repeat paracentesis [id number] without complication - continue Cipro for SBP prophylaxis - resume maintenance Lasix and Aldactone today - acute decompensation in mental status appears to simply have been due to failure to comply with lactulose   Severe sepsis due to ESBL Klebsiella UTI Completed a course of ertapenem 03/07/2022 - sepsis resolved  Profound aphasia with dysphagia felt to be related to intubation/vocal cord trauma -voice slowly improving - advance diet as per SLP with patient now cleared for regular diet with nectar thick liquids -poor intake at present appears to be  primarily due to lack of appetite as opposed to dysphagia -give a trial of appetite stimulant -with patient able to tolerate a diet she is not core track dependent and the plan will be to discontinue her coretrack as soon as arrangements are made for discharge  Acute respiratory failure Was intubated for airway protection and not for hypoxemia - extubated 03/06/2022  Acute kidney injury Due to nausea vomiting diarrhea and hypovolemia and exacerbated by ongoing use of diuretics - creatinine has stabilized and is presently normal  Severe malnutrition in context of chronic illness  As evidenced by severe fat depletion and severe muscle depletion  Family Communication: Spoke with family at bedside Disposition: From home - requires SNF for rehab stay -medically ready for SNF placement   Objective: Blood pressure 110/72, pulse 95, temperature 99 F (37.2 C), resp. rate 16, height 5' 6"$  (1.676 m), weight 61.1 kg, SpO2 100 %.  Intake/Output Summary (Last 24 hours) at 03/15/2022 0824 Last data filed at 03/15/2022 0600 Gross per 24 hour  Intake 1732.08 ml  Output 1270 ml  Net 462.08 ml    Filed Weights   03/13/22 0500 03/14/22 0452 03/15/22 0500  Weight: 61.1 kg 61.1 kg 61.1 kg    Examination: General: No acute respiratory distress Lungs: Clear to auscultation bilaterally -no wheezing Cardiovascular: Regular rate and rhythm without murmur  Abdomen: Mildly protuberant, soft, bowel sounds positive, no rebound Extremities: No significant edema bilateral lower extremities  CBC: Recent Labs  Lab 03/13/22 0318 03/14/22 0626 03/15/22 0743  WBC 8.9 10.0 8.9  HGB 8.0* 7.7* 7.8*  HCT 27.2* 24.7* 25.1*  MCV 99.3 96.1 96.2  PLT  101* 91* 102*    Basic Metabolic Panel: Recent Labs  Lab 03/12/22 0326 03/13/22 0318 03/14/22 0626  NA 157* 152* 145  K 3.9 3.9 4.2  CL 125* 122* 118*  CO2 22 22 18*  GLUCOSE 203* 190* 174*  BUN 32* 29* 26*  CREATININE 0.84 0.85 0.84  CALCIUM 9.0 8.8*  8.4*    GFR: Estimated Creatinine Clearance: 83.3 mL/min (by C-G formula based on SCr of 0.84 mg/dL).   Scheduled Meds:  ciprofloxacin  500 mg Per Tube Q breakfast   feeding supplement  1 Container Oral TID BM   feeding supplement (PROSource TF20)  60 mL Per Tube Daily   folic acid  1 mg Per Tube Daily   free water  100 mL Per Tube Q6H   heparin  5,000 Units Subcutaneous Q8H   lactulose  10 g Per Tube TID   multivitamin with minerals  1 tablet Per Tube Daily   mouth rinse  15 mL Mouth Rinse 4 times per day   pantoprazole  40 mg Oral BID AC   rifaximin  550 mg Per Tube BID   thiamine  100 mg Oral Daily   zinc sulfate  220 mg Per Tube Daily   Continuous Infusions:  feeding supplement (OSMOLITE 1.5 CAL) 1,000 mL (03/15/22 0543)     LOS: 14 days   Cherene Altes, MD [facility] Office  [phone] Pager - Text Page per Shea Evans  If 7PM-7AM, please contact night-coverage per Amion 03/15/2022, 8:24 AM

## 2022-03-15 NOTE — Progress Notes (Signed)
Patient had an episode of emesis. Large and bloody. NG tube came out. Dr. Thereasa Solo notified. She is stable and say's she is feeling much better. See new orders.

## 2022-03-16 ENCOUNTER — Inpatient Hospital Stay (HOSPITAL_COMMUNITY): Payer: Medicaid Other

## 2022-03-16 DIAGNOSIS — K7682 Hepatic encephalopathy: Secondary | ICD-10-CM | POA: Diagnosis not present

## 2022-03-16 LAB — GLUCOSE, CAPILLARY
Glucose-Capillary: 124 mg/dL — ABNORMAL HIGH (ref 70–99)
Glucose-Capillary: 145 mg/dL — ABNORMAL HIGH (ref 70–99)
Glucose-Capillary: 183 mg/dL — ABNORMAL HIGH (ref 70–99)
Glucose-Capillary: 186 mg/dL — ABNORMAL HIGH (ref 70–99)

## 2022-03-16 MED ORDER — OXYCODONE HCL 5 MG PO TABS
5.0000 mg | ORAL_TABLET | Freq: Four times a day (QID) | ORAL | Status: AC | PRN
  Administered 2022-03-16: 5 mg via ORAL
  Filled 2022-03-16: qty 1

## 2022-03-16 NOTE — Progress Notes (Signed)
Erica Banks  [id number] DOB: 1981/01/27 DOA: 03/01/2022 PCP: Neale Burly, MD    Brief Narrative:  42 year old with a history of anemia of chronic disease, alcoholic cirrhosis, and DVT/PE who presented to the ER with generalized malaise and fatigue and significant worsening confusion per family.  After presentation she was found to become severely lethargic and was therefore intubated in the ER for "airway protection."  Further history by family indicated the patient had been noncompliant with lactulose as an outpatient.  Consultants:  None  Goals of Care:  Code Status: Full Code   DVT prophylaxis: SCDs  Interim Hx: The patient had an episode of vomiting yesterday evening during which her NG came out and dark-colored bloody emesis was appreciated.  Subcu heparin was discontinued.  Follow-up hemoglobin was actually stable.  The patient reported she felt better after vomiting.  The decision was made to not try to replace her NG tube.  Vital signs stable.  Afebrile.  Smiling and pleasant sitting up in a chair.  States she feels the best she has in a long time.  No complaints at the time of visit.  Assessment & Plan:  Free water deficit - hypovolemic hyponatremia Sodium has normalized with free water administration - resumed diuretic 2/16 - follow Na/lytes intermittently   Acute on chronic hepatic encephalopathy - decompensated cirrhosis of the liver due to alcohol Continue rifaximin and increased dose of lactulose - status post repeat paracentesis [id number] without complication - continue Cipro for SBP prophylaxis - resumed maintenance Lasix and Aldactone 2/16 - acute decompensation in mental status appears to simply have been due to failure to comply with lactulose   Severe sepsis due to ESBL Klebsiella UTI Completed a course of ertapenem 03/07/2022 - sepsis resolved  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  felt to be related to intubation/vocal cord trauma - voice continues  to improve w/ time - advance diet as per SLP with patient now cleared for regular diet with nectar thick liquids - poor intake at present appears to be primarily due to lack of appetite as opposed to dysphagia - giving a trial of appetite stimulant - Coretrak came out w/ vomiting 2/16 and I decided to not replace it  Acute respiratory failure Was intubated for airway protection and not for hypoxemia - extubated 03/06/2022  Acute kidney injury Due to nausea vomiting diarrhea and hypovolemia and exacerbated by ongoing use of diuretics - creatinine has stabilized and is presently normal  Severe malnutrition in context of chronic illness  As evidenced by severe fat depletion and severe muscle depletion  Family Communication:  Disposition: From home - requires SNF for rehab stay but this is not covered by her Medicaid - medically ready for disposition - CIR has been consulted    Objective: Blood pressure 115/71, pulse 94, temperature 98.1 F (36.7 C), temperature source Oral, resp. rate 18, height 5' 6"$  (1.676 m), weight 58.7 kg, SpO2 100 %. No intake or output data in the 24 hours ending 03/16/22 0748  Filed Weights   03/14/22 0452 03/15/22 0500 03/16/22 0500  Weight: 61.1 kg 61.1 kg 58.7 kg    Examination: General: No acute respiratory distress Lungs: Clear to auscultation bilaterally Cardiovascular: Regular rate and rhythm without murmur  Abdomen: More protuberant, soft, bowel sounds positive, no rebound Extremities: No significant edema bilateral lower extremities  CBC: Recent Labs  Lab 03/14/22 0626 03/15/22 0743 03/15/22 2015  WBC 10.0 8.9 9.9  HGB 7.7* 7.8* 8.2*  HCT  24.7* 25.1* 26.3*  MCV 96.1 96.2 96.3  PLT 91* 102* 117*    Basic Metabolic Panel: Recent Labs  Lab 03/13/22 0318 03/14/22 0626 03/15/22 0743  NA 152* 145 139  K 3.9 4.2 4.4  CL 122* 118* 111  CO2 22 18* 19*  GLUCOSE 190* 174* 298*  BUN 29* 26* 31*  CREATININE 0.85 0.84 0.82  CALCIUM 8.8* 8.4*  8.4*  MG  --   --  1.9  PHOS  --   --  3.1    GFR: Estimated Creatinine Clearance: 84.5 mL/min (by C-G formula based on SCr of 0.82 mg/dL).   Scheduled Meds:  ciprofloxacin  500 mg Oral Q breakfast   dronabinol  2.5 mg Oral BID AC   feeding supplement  1 Container Oral TID BM   folic acid  1 mg Oral Daily   furosemide  40 mg Oral Daily   lactulose  10 g Oral TID   multivitamin with minerals  1 tablet Oral Daily   mouth rinse  15 mL Mouth Rinse 4 times per day   pantoprazole  40 mg Oral BID AC   rifaximin  550 mg Oral BID   spironolactone  25 mg Oral Daily   thiamine  100 mg Oral Daily   zinc sulfate  220 mg Oral Daily     LOS: 15 days   Cherene Altes, MD [facility] Office  [phone] Pager - Text Page per Shea Evans  If 7PM-7AM, please contact night-coverage per Amion 03/16/2022, 7:48 AM

## 2022-03-16 NOTE — Progress Notes (Signed)
Mobility Specialist: Progress Note   03/16/22 1042  Mobility  Activity Ambulated with assistance in hallway  Level of Assistance Minimal assist, patient does 75% or more  Assistive Device Front wheel walker  Distance Ambulated (ft) 150 ft (10'+10'+130')  Activity Response Tolerated well  Mobility Referral Yes  $Mobility charge 1 Mobility   Pt received in the bed and agreeable to mobility. To BR per request, then agreeable to hallway ambulation. Mod I with bed mobility and minA to stand. Mild unsteadiness during ambulation but no overt LOB. Pt to the chair after session with call bell at her side. Bed alarm is on.   [place] Nichola Cieslinski Mobility Specialist Please contact via SecureChat or Rehab office at [phone]

## 2022-03-16 NOTE — Progress Notes (Signed)
Inpatient Rehab Admissions Coordinator:    I met with Pt., sister, and BIL at bedside to discuss potential CIR admit. They are interested and state that sister, Pt.'s children, and Pt.'s husband can rotate to provide 24/7 care (sister can [facility] days, then Pt.'s husband is there in the evenings). I will open a case with Pt.'s insurance on Monday and pursue for admit.   Clemens Catholic, [place], [place] Admissions Coordinator  [phone] ([place]) [phone] (office)

## 2022-03-17 DIAGNOSIS — K7682 Hepatic encephalopathy: Secondary | ICD-10-CM | POA: Diagnosis not present

## 2022-03-17 LAB — GLUCOSE, CAPILLARY
Glucose-Capillary: 124 mg/dL — ABNORMAL HIGH (ref 70–99)
Glucose-Capillary: 116 mg/dL — ABNORMAL HIGH (ref 70–99)
Glucose-Capillary: 166 mg/dL — ABNORMAL HIGH (ref 70–99)
Glucose-Capillary: 185 mg/dL — ABNORMAL HIGH (ref 70–99)

## 2022-03-17 LAB — COMPREHENSIVE METABOLIC PANEL
ALT: 56 U/L — ABNORMAL HIGH (ref 0–44)
AST: 72 U/L — ABNORMAL HIGH (ref 15–41)
Albumin: 3.2 g/dL — ABNORMAL LOW (ref 3.5–5.0)
Alkaline Phosphatase: 84 U/L (ref 38–126)
Anion gap: 11 (ref 5–15)
BUN: 27 mg/dL — ABNORMAL HIGH (ref 6–20)
CO2: 18 mmol/L — ABNORMAL LOW (ref 22–32)
Calcium: 8.7 mg/dL — ABNORMAL LOW (ref 8.9–10.3)
Chloride: 108 mmol/L (ref 98–111)
Creatinine, Ser: 0.95 mg/dL (ref 0.44–1.00)
GFR, Estimated: >60 mL/min (ref >60)
Glucose, Bld: 125 mg/dL — ABNORMAL HIGH (ref 70–99)
Potassium: 4 mmol/L (ref 3.5–5.1)
Sodium: 137 mmol/L (ref 135–145)
Total Bilirubin: 3.9 mg/dL — ABNORMAL HIGH (ref 0.3–1.2)
Total Protein: 5.6 g/dL — ABNORMAL LOW (ref 6.5–8.1)

## 2022-03-17 LAB — CBC
HCT: 24.3 % — ABNORMAL LOW (ref 36.0–46.0)
Hemoglobin: 7.8 g/dL — ABNORMAL LOW (ref 12.0–15.0)
MCH: 30 pg (ref 26.0–34.0)
MCHC: 32.1 g/dL (ref 30.0–36.0)
MCV: 93.5 fL (ref 80.0–100.0)
Platelets: 131 10*3/uL — ABNORMAL LOW (ref 150–400)
RBC: 2.6 MIL/uL — ABNORMAL LOW (ref 3.87–5.11)
RDW: 20.9 % — ABNORMAL HIGH (ref 11.5–15.5)
WBC: 7.9 10*3/uL (ref 4.0–10.5)
nRBC: 0 % (ref 0.0–0.2)

## 2022-03-17 LAB — PHOSPHORUS: Phosphorus: 4.4 mg/dL (ref 2.5–4.6)

## 2022-03-17 LAB — MAGNESIUM: Magnesium: 1.5 mg/dL — ABNORMAL LOW (ref 1.7–2.4)

## 2022-03-17 NOTE — Progress Notes (Signed)
Mobility Specialist: Progress Note   03/17/22 1040  Mobility  Activity Ambulated with assistance in hallway  Level of Assistance Minimal assist, patient does 75% or more  Assistive Device Front wheel walker  Distance Ambulated (ft) 150 ft  Activity Response Tolerated well  Mobility Referral Yes  $Mobility charge 1 Mobility   Pt received in the bed and agreeable to mobility. Mod I with bed mobility and minA to stand. Bowel incontinent upon standing so transferred to [facility], BM successful. After pericare pt agreeable to hallway ambulation. Mildly unsteady throughout but no overt LOB. Chair follow for safety but no seated break necessary. Pt to the chair after session with call bell and phone at her side. Chair alarm is on.   [place] Chino Sardo Mobility Specialist Please contact via SecureChat or Rehab office at [phone]

## 2022-03-17 NOTE — Progress Notes (Signed)
Erica Banks  [id number] DOB: 08/21/1980 DOA: 03/01/2022 PCP: Neale Burly, MD    Brief Narrative:  42 year old with a history of anemia of chronic disease, alcoholic cirrhosis, and DVT/PE who presented to the ER with generalized malaise and fatigue and significant worsening confusion per family.  After presentation she was found to become severely lethargic and was therefore intubated in the ER for "airway protection."  Further history by family indicated the patient had been noncompliant with lactulose as an outpatient.  Consultants:  None  Goals of Care:  Code Status: Full Code   DVT prophylaxis: SCDs  Interim Hx: Made frequent requests for pain medication due to abdominal discomfort last night. Reports she feels better at the time of my visit today. Is in good spirits and laughing w/ family.  Afebrile.  Vital signs stable.  Assessment & Plan:  Free water deficit - hypovolemic hyponatremia Sodium has normalized with free water administration - resumed diuretic 2/16 - follow Na/lytes intermittently   Acute on chronic hepatic encephalopathy - decompensated cirrhosis of the liver due to alcohol Continue rifaximin and lactulose - status post repeat paracentesis [id number] without complication - continue Cipro for SBP prophylaxis - resumed maintenance Lasix and Aldactone 2/16 - acute decompensation in mental status appears to simply have been due to failure to comply with lactulose -have counseled patient on absolute need to comply with lactulose and that failure to do so will simply result in need for repeat hospitalization  Severe sepsis due to ESBL Klebsiella UTI Completed a course of ertapenem 03/07/2022 - sepsis resolved  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  felt to be related to intubation/vocal cord trauma - voice continues to improve w/ time - advance diet as per SLP with patient now cleared for regular diet with nectar thick liquids - poor intake appears to be  primarily due to lack of appetite as opposed to dysphagia - giving a trial of appetite stimulant - Coretrak came out w/ vomiting 2/16 and I decided to not replace it  Acute respiratory failure Was intubated for airway protection and not for hypoxemia - extubated 03/06/2022  Acute kidney injury Due to nausea vomiting diarrhea and hypovolemia and exacerbated by ongoing use of diuretics - creatinine has stabilized and is presently normal  Severe malnutrition in context of chronic illness  As evidenced by severe fat depletion and severe muscle depletion  Family Communication: Spoke with family at bedside Disposition: From home - requires SNF for rehab stay but this is not covered by her Medicaid - medically ready for disposition - CIR has been consulted    Objective: Blood pressure 122/69, pulse 95, temperature 99.1 F (37.3 C), temperature source Oral, resp. rate 15, height 5' 6"$  (1.676 m), weight 57.8 kg, SpO2 99 %.  Intake/Output Summary (Last 24 hours) at 03/17/2022 0858 Last data filed at 03/17/2022 0054 Gross per 24 hour  Intake --  Output 500 ml  Net -500 ml    Filed Weights   03/15/22 0500 03/16/22 0500 03/17/22 0500  Weight: 61.1 kg 58.7 kg 57.8 kg    Examination: General: No acute respiratory distress Lungs: Clear to auscultation bilaterally Cardiovascular: Regular rate and rhythm - no M Abdomen: Protuberant but w/o signif change since yesterday, soft, bowel sounds positive, no rebound Extremities: No significant edema bilateral lower extremities  CBC: Recent Labs  Lab 03/15/22 0743 03/15/22 2015 03/17/22 0313  WBC 8.9 9.9 7.9  HGB 7.8* 8.2* 7.8*  HCT 25.1* 26.3* 24.3*  MCV  96.2 96.3 93.5  PLT 102* 117* 131*    Basic Metabolic Panel: Recent Labs  Lab 03/14/22 0626 03/15/22 0743 03/17/22 0313  NA 145 139 137  K 4.2 4.4 4.0  CL 118* 111 108  CO2 18* 19* 18*  GLUCOSE 174* 298* 125*  BUN 26* 31* 27*  CREATININE 0.84 0.82 0.95  CALCIUM 8.4* 8.4* 8.7*  MG   --  1.9 1.5*  PHOS  --  3.1 4.4    GFR: Estimated Creatinine Clearance: 71.8 mL/min (by C-G formula based on SCr of 0.95 mg/dL).   Scheduled Meds:  ciprofloxacin  500 mg Oral Q breakfast   dronabinol  2.5 mg Oral BID AC   feeding supplement  1 Container Oral TID BM   folic acid  1 mg Oral Daily   furosemide  40 mg Oral Daily   lactulose  10 g Oral TID   multivitamin with minerals  1 tablet Oral Daily   mouth rinse  15 mL Mouth Rinse 4 times per day   pantoprazole  40 mg Oral BID AC   rifaximin  550 mg Oral BID   spironolactone  25 mg Oral Daily   thiamine  100 mg Oral Daily   zinc sulfate  220 mg Oral Daily     LOS: 16 days   Cherene Altes, MD [facility] Office  [phone] Pager - Text Page per Shea Evans  If 7PM-7AM, please contact night-coverage per Amion 03/17/2022, 8:58 AM

## 2022-03-17 NOTE — Progress Notes (Signed)
Mobility Specialist: Progress Note   03/17/22 1500  Mobility  Activity Ambulated with assistance to bathroom  Level of Assistance Minimal assist, patient does 75% or more  Assistive Device Other (Comment) (HHA)  Distance Ambulated (ft) 20 ft (10'x2)  Activity Response Tolerated well  Mobility Referral Yes  $Mobility charge 1 Mobility   Pt received in the bed and requesting to use the BR. MinA to stand as well as during ambulation for balance. No c/o throughout. Pt sitting EOB after session with call bell in reach. Bed alarm is on.   Erica Banks Mobility Specialist Please contact via SecureChat or Rehab office at [phone]

## 2022-03-18 ENCOUNTER — Inpatient Hospital Stay (HOSPITAL_COMMUNITY): Payer: Medicaid Other

## 2022-03-18 ENCOUNTER — Other Ambulatory Visit (HOSPITAL_COMMUNITY): Payer: Self-pay

## 2022-03-18 DIAGNOSIS — K7682 Hepatic encephalopathy: Secondary | ICD-10-CM | POA: Diagnosis not present

## 2022-03-18 LAB — CBC
HCT: 25.9 % — ABNORMAL LOW (ref 36.0–46.0)
Hemoglobin: 8.4 g/dL — ABNORMAL LOW (ref 12.0–15.0)
MCH: 29.9 pg (ref 26.0–34.0)
MCHC: 32.4 g/dL (ref 30.0–36.0)
MCV: 92.2 fL (ref 80.0–100.0)
Platelets: 161 10*3/uL (ref 150–400)
RBC: 2.81 MIL/uL — ABNORMAL LOW (ref 3.87–5.11)
RDW: 20.8 % — ABNORMAL HIGH (ref 11.5–15.5)
WBC: 7.4 10*3/uL (ref 4.0–10.5)
nRBC: 0 % (ref 0.0–0.2)

## 2022-03-18 LAB — COMPREHENSIVE METABOLIC PANEL
ALT: 56 U/L — ABNORMAL HIGH (ref 0–44)
AST: 64 U/L — ABNORMAL HIGH (ref 15–41)
Albumin: 3.2 g/dL — ABNORMAL LOW (ref 3.5–5.0)
Alkaline Phosphatase: 102 U/L (ref 38–126)
Anion gap: 11 (ref 5–15)
BUN: 18 mg/dL (ref 6–20)
CO2: 19 mmol/L — ABNORMAL LOW (ref 22–32)
Calcium: 8.6 mg/dL — ABNORMAL LOW (ref 8.9–10.3)
Chloride: 104 mmol/L (ref 98–111)
Creatinine, Ser: 0.9 mg/dL (ref 0.44–1.00)
GFR, Estimated: >60 mL/min (ref >60)
Glucose, Bld: 142 mg/dL — ABNORMAL HIGH (ref 70–99)
Potassium: 3.7 mmol/L (ref 3.5–5.1)
Sodium: 134 mmol/L — ABNORMAL LOW (ref 135–145)
Total Bilirubin: 3.5 mg/dL — ABNORMAL HIGH (ref 0.3–1.2)
Total Protein: 5.8 g/dL — ABNORMAL LOW (ref 6.5–8.1)

## 2022-03-18 LAB — GLUCOSE, CAPILLARY
Glucose-Capillary: 122 mg/dL — ABNORMAL HIGH (ref 70–99)
Glucose-Capillary: 133 mg/dL — ABNORMAL HIGH (ref 70–99)

## 2022-03-18 MED ORDER — RIFAXIMIN 550 MG PO TABS
550.0000 mg | ORAL_TABLET | Freq: Two times a day (BID) | ORAL | 2 refills | Status: AC
  Filled 2022-03-18: qty 60, 30d supply, fill #0

## 2022-03-18 MED ORDER — DRONABINOL 2.5 MG PO CAPS
2.5000 mg | ORAL_CAPSULE | Freq: Two times a day (BID) | ORAL | 0 refills | Status: DC
  Filled 2022-03-18: qty 20, 10d supply, fill #0

## 2022-03-18 MED ORDER — THIAMINE HCL 100 MG PO TABS
100.0000 mg | ORAL_TABLET | Freq: Every day | ORAL | 2 refills | Status: DC
  Filled 2022-03-18: qty 30, 30d supply, fill #0

## 2022-03-18 MED ORDER — SPIRONOLACTONE 50 MG PO TABS
50.0000 mg | ORAL_TABLET | Freq: Every day | ORAL | 2 refills | Status: DC
  Filled 2022-03-18: qty 30, 30d supply, fill #0

## 2022-03-18 MED ORDER — SPIRONOLACTONE 25 MG PO TABS
25.0000 mg | ORAL_TABLET | Freq: Once | ORAL | Status: AC
  Administered 2022-03-18: 25 mg via ORAL
  Filled 2022-03-18: qty 1

## 2022-03-18 MED ORDER — DRONABINOL 2.5 MG PO CAPS: 2.5000 mg | ORAL_CAPSULE | Freq: Two times a day (BID) | ORAL | 0 refills | Status: DC

## 2022-03-18 MED ORDER — SPIRONOLACTONE 25 MG PO TABS: 50.0000 mg | ORAL_TABLET | Freq: Every day | ORAL | Status: DC

## 2022-03-18 MED ORDER — FUROSEMIDE 40 MG PO TABS: 40.0000 mg | ORAL_TABLET | Freq: Two times a day (BID) | ORAL | Status: DC

## 2022-03-18 MED ORDER — CIPROFLOXACIN HCL 500 MG PO TABS
500.0000 mg | ORAL_TABLET | Freq: Every day | ORAL | 2 refills | Status: DC
  Filled 2022-03-18: qty 30, 30d supply, fill #0

## 2022-03-18 MED ORDER — FUROSEMIDE 40 MG PO TABS
40.0000 mg | ORAL_TABLET | Freq: Two times a day (BID) | ORAL | 2 refills | Status: AC
  Filled 2022-03-18: qty 60, 30d supply, fill #0

## 2022-03-18 MED ORDER — ADULT MULTIVITAMIN W/MINERALS CH
1.0000 | ORAL_TABLET | Freq: Every day | ORAL | 2 refills | Status: AC
  Filled 2022-03-18: qty 30, 30d supply, fill #0

## 2022-03-18 MED ORDER — LACTULOSE 10 GM/15ML PO SOLN
10.0000 g | Freq: Three times a day (TID) | ORAL | 2 refills | Status: AC
  Filled 2022-03-18: qty 948, 21d supply, fill #0

## 2022-03-18 MED ORDER — FOLIC ACID 1 MG PO TABS
1.0000 mg | ORAL_TABLET | Freq: Every day | ORAL | 2 refills | Status: DC
  Filled 2022-03-18: qty 30, 30d supply, fill #0

## 2022-03-18 MED ORDER — PANTOPRAZOLE SODIUM 40 MG PO TBEC
40.0000 mg | DELAYED_RELEASE_TABLET | Freq: Two times a day (BID) | ORAL | 2 refills | Status: AC
  Filled 2022-03-18: qty 60, 30d supply, fill #0

## 2022-03-18 NOTE — Discharge Summary (Signed)
DISCHARGE SUMMARY  HECTOR ALLAIN  MR#: [id number]  DOB:1980/10/11  Date of Admission: 03/01/2022 Date of Discharge: 03/18/2022  Attending Physician:Jhordyn Hoopingarner Hennie Duos, MD  Patient's [id number], Samul Dada, MD  Consults: PCCM   Disposition: D/C home   Follow-up Appts:  Follow-up Information     Hasanaj, Samul Dada, MD Follow up in 5 day(s).   Specialty: Internal Medicine Contact information: [place] [id number]         [facility] [place] Follow up.   Specialty: General Practice Why: Follow up , call for appointment Contact information: [facility] [address] Room [id number] - [place] [address] [place] [facility] [place] Follow up.   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 After presentation she was found to become severely lethargic and was therefore intubated in the ER for "airway protection." Further history by family indicated the patient had been noncompliant with  lactulose as an outpatient.   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 Continue rifaximin and lactulose - status post repeat paracentesis [id number] without complication - continue Cipro for SBP prophylaxis - resumed maintenance Lasix and Aldactone 2/16 - acute decompensation in mental status appears to simply have been due to failure to comply with lactulose - have counseled patient on absolute need to comply with lactulose and that failure to do so will result in need for repeat hospitalization - cont Lasix and Aldactone today in hopes of slowing rate of reaccumulation of ascites   Severe sepsis due to ESBL Klebsiella UTI Completed a course of ertapenem 03/07/2022 - sepsis resolved   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  felt to be related to intubation/vocal cord trauma - voice continued to improve w/ time - advance diet as per SLP with patient now cleared for regular diet with thin liquids on day of d/c - poor intake appears to be primarily due to lack of appetite as opposed to dysphagia - giving a trial of appetite stimulant - Coretrak came out w/ vomiting 2/16 and we left it out - pt's intake has steadily improved since Cortrak removal    Acute respiratory failure Was intubated for airway protection and not for hypoxemia - extubated 03/06/2022 - remains stable on RA   Acute kidney injury Due to nausea vomiting diarrhea and hypovolemia and exacerbated by ongoing use of diuretics - creatinine  has stabilized and is presently normal   Severe malnutrition in context of chronic illness  As evidenced by severe fat depletion and severe muscle depletion -patient has been counseled on the absolute need to maintain adequate nutrition - trial of marinol ongoing at time of  d/c   Allergies as of 03/18/2022       Reactions   Demerol Anaphylaxis   Per patient cardiac arrest        Medication List     STOP taking these medications    gabapentin 300 MG capsule Commonly known as: NEURONTIN   hydrOXYzine 25 MG capsule Commonly known as: VISTARIL   meclizine 12.5 MG tablet Commonly known as: ANTIVERT   methocarbamol 500 MG tablet Commonly known as: ROBAXIN   metoCLOPramide 5 MG tablet Commonly known as: REGLAN   ondansetron 4 MG disintegrating tablet Commonly known as: ZOFRAN-ODT   oxyCODONE 5 MG immediate release tablet Commonly known as: Oxy IR/ROXICODONE   promethazine 12.5 MG tablet Commonly known as: PHENERGAN       TAKE these medications    ciprofloxacin 500 MG tablet Commonly known as: CIPRO Take 1 tablet (500 mg total) by mouth daily with breakfast. Start taking on: March 19, 2022   dronabinol 2.5 MG capsule Commonly known as: MARINOL Take 1 capsule (2.5 mg total) by mouth 2 (two) times daily before lunch and supper.   folic acid 1 MG tablet Commonly known as: FOLVITE Take 1 tablet (1 mg total) by mouth daily. Start taking on: March 19, 2022   furosemide 40 MG tablet Commonly known as: LASIX Take 1 tablet (40 mg total) by mouth 2 (two) times daily. What changed: when to take this   lactulose 10 GM/15ML solution Commonly known as: CHRONULAC Take 15 mLs (10 g total) by mouth 3 (three) times daily. What changed: how much to take   multivitamin with minerals Tabs tablet Take 1 tablet by mouth daily.   pantoprazole 40 MG tablet Commonly known as: PROTONIX Take 1 tablet (40 mg total) by mouth 2 (two) times daily before a meal. What changed: when to take this   rifaximin 550 MG Tabs tablet Commonly known as: XIFAXAN Take 1 tablet (550 mg total) by mouth 2 (two) times daily.   spironolactone 50 MG tablet Commonly known as: ALDACTONE Take 1 tablet (50 mg total) by mouth daily. Start taking on: March 19, 2022 What changed:  medication strength how much to take   thiamine 100 MG tablet Commonly known as: Vitamin B-1 Take 1 tablet (100 mg total) by mouth daily. Start taking on: March 19, 2022               Durable Medical Equipment  (From admission, onward)           Start     Ordered   03/18/22 1148  For home use only DME 4 wheeled rolling walker with seat  Once       Question:  Patient needs a walker to treat with the following condition  Answer:  Weakness   03/18/22 1147            Day of Discharge BP 134/77 (BP Location: Left Arm)   Pulse 100   Temp 98.3 F (36.8 C) (Oral)   Resp 18   Ht 5' 6"$  (1.676 m)   Wt 57.8 kg   LMP  (LMP Unknown)   SpO2 100%   BMI 20.57 kg/m   Physical Exam: General: No acute respiratory distress Lungs:  Clear to auscultation bilaterally without wheezes or crackles Cardiovascular: Regular rate and rhythm without murmur gallop or rub normal S1 and S2 Abdomen: Nontender, nondistended, soft, bowel sounds positive, no rebound, no ascites, no appreciable mass Extremities: No significant cyanosis, clubbing, or edema bilateral lower extremities  Basic Metabolic Panel: Recent Labs  Lab 03/13/22 0318 03/14/22 0626 03/15/22 0743 03/17/22 0313 03/18/22 0346  NA 152* 145 139 137 134*  K 3.9 4.2 4.4 4.0 3.7  CL 122* 118* 111 108 104  CO2 22 18* 19* 18* 19*  GLUCOSE 190* 174* 298* 125* 142*  BUN 29* 26* 31* 27* 18  CREATININE 0.85 0.84 0.82 0.95 0.90  CALCIUM 8.8* 8.4* 8.4* 8.7* 8.6*  MG  --   --  1.9 1.5*  --   PHOS  --   --  3.1 4.4  --     CBC: Recent Labs  Lab 03/14/22 0626 03/15/22 0743 03/15/22 2015 03/17/22 0313 03/18/22 0346  WBC 10.0 8.9 9.9 7.9 7.4  HGB 7.7* 7.8* 8.2* 7.8* 8.4*  HCT 24.7* 25.1* 26.3* 24.3* 25.9*  MCV 96.1 96.2 96.3 93.5 92.2  PLT 91* 102* 117* 131* 161    Time spent in discharge (includes decision making & examination of pt): 35 minutes  03/18/2022, 11:48 AM   Cherene Altes,  MD [facility] Office  [phone]

## 2022-03-18 NOTE — Progress Notes (Signed)
Patient given all discharge instructions and verbalized understanding. PIV x3 removed and patient dressed herself already. TOC medications with patient along with all patient belongings. Patient discharged home with family via wheelchair.

## 2022-03-18 NOTE — Progress Notes (Signed)
Inpatient Rehab Admissions Coordinator:   PT now recommending outpatient. Pt. In agreement. CIR will sign off.   Clemens Catholic, [place], [place] Admissions Coordinator  [phone] ([place]) [phone] (office)

## 2022-03-18 NOTE — Progress Notes (Signed)
Modified Barium Swallow Study  Patient Details  Name: Erica Banks MRN: [id number] Date of Birth: Jun 09, 1980  Today's Date: 03/18/2022  Modified Barium Swallow completed.  Full report located under Chart Review in the Imaging Section.  History of Present Illness Erica Banks is a 42 yo F with Etoh Cirrhosis/ascites and h/o hep encephalopathy d/c from [id number] [facility] 1/29 to GI clinic 2/2 am to schedule paracentesis but woke up confused > referred from GI to ED. Lethargic, risk of aspiration and need for rx with lactulose so electively intubated.  ETT 2/2-2/8. Per RD note husbad reports "pt has a very poor appetite at baseline. 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  Also with history of IDA following with hematology with prescription iron infusions, GERD, PE, DVT, neuropathy.   Clinical Impression Pt presents with much improved swallowing function. There was mild impairment in tongue base retraction, leading to mild vallecular residue of all consistencies, cleared spontaneously with a sub-swallow.  There was partial distention of flow through PES - it did not negatively impact function. There was one incident of transient penetration of thin liquids (PAS score of 2) when swallowing 13 mm pill with thin liquids. There was no aspiration. Recommend advancing liquids from nectars to thin; continue regular solids. No further SLP intervention is warranted. Our service will sign off.   Factors that may increase risk of adverse event in presence of aspiration Erica Banks & Erica Banks 2021):  N/A  Swallow Evaluation Recommendations Recommendations: PO diet PO Diet Recommendation: Regular;Thin liquids (Level 0) Liquid Administration via: Straw;Cup Medication Administration: Whole meds with  liquid Supervision: Patient able to self-feed     Erica Banks L. Erica Ringer, MA CCC/SLP Clinical Specialist - Acute Care SLP Acute Rehabilitation Services Office number [phone]  Erica Banks 03/18/2022,10:25 AM

## 2022-03-18 NOTE — Plan of Care (Signed)
  Problem: Education: Goal: Knowledge of General Education information will improve Description: Including pain rating scale, medication(s)/side effects and non-pharmacologic comfort measures Outcome: Adequate for Discharge   Problem: Health Behavior/Discharge Planning: Goal: Ability to manage health-related needs will improve Outcome: Adequate for Discharge   Problem: Clinical Measurements: Goal: Ability to maintain clinical measurements within normal limits will improve Outcome: Adequate for Discharge Goal: Will remain free from infection Outcome: Adequate for Discharge Goal: Diagnostic test results will improve Outcome: Adequate for Discharge Goal: Respiratory complications will improve Outcome: Adequate for Discharge Goal: Cardiovascular complication will be avoided Outcome: Adequate for Discharge   Problem: Activity: Goal: Risk for activity intolerance will decrease Outcome: Adequate for Discharge   Problem: Nutrition: Goal: Adequate nutrition will be maintained Outcome: Adequate for Discharge   Problem: Coping: Goal: Level of anxiety will decrease Outcome: Adequate for Discharge   Problem: Elimination: Goal: Will not experience complications related to bowel motility Outcome: Adequate for Discharge Goal: Will not experience complications related to urinary retention Outcome: Adequate for Discharge   Problem: Pain Managment: Goal: General experience of comfort will improve Outcome: Adequate for Discharge   Problem: Safety: Goal: Ability to remain free from injury will improve Outcome: Adequate for Discharge   Problem: Skin Integrity: Goal: Risk for impaired skin integrity will decrease Outcome: Adequate for Discharge   Problem: Acute Rehab OT Goals (only OT should resolve) Goal: Pt. Will Perform Grooming Outcome: Adequate for Discharge Goal: OT Additional ADL Goal #1 Outcome: Adequate for Discharge Goal: OT Additional ADL Goal #2 Outcome: Adequate for  Discharge   Problem: Acute Rehab PT Goals(only PT should resolve) Goal: Pt Will Go Supine/Side To Sit Outcome: Adequate for Discharge Goal: Patient Will Perform Sitting Balance Outcome: Adequate for Discharge Goal: Pt Will Transfer Bed To Chair/Chair To Bed Outcome: Adequate for Discharge   Problem: SLP Dysphagia Goals Goal: Patient will utilize recommended strategies Description: Patient will utilize recommended strategies during swallow to increase swallowing safety with Outcome: Adequate for Discharge Goal: Misc Dysphagia Goal Outcome: Adequate for Discharge

## 2022-03-18 NOTE — Progress Notes (Signed)
Physical Therapy Treatment Patient Details Name: Erica Banks MRN: [id number] DOB: 1980-11-18 Today's Date: 03/18/2022   History of Present Illness Erica Banks is a 42 y.o. 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  Intubated for airway protection on 03/01/22 until 03/07/22. Patient was recently discharged from [facility] on 02/25/2022 for similar episode of encephalopathy secondary to cirrhosis and worsening ascites. Pt with PHM significant for but not limited to:  chronic anemia of chronic disease, alcoholic cirrhosis, neuropathy, esophageal varices, history of DVT/PE.    PT Comments    Pt making excellent progress towards her physical therapy goals, exhibiting improved activity tolerance and ambulation distance. Pt ambulating ~300 ft with RW vs Rollator at a supervision level. Negotiated 6 steps with bilateral railings to simulate home entrance. Pt continues to benefit from assistive device for increased independence and stability. Pt reports good family support upon d/c and will have available supervision for IADL's. Updated d/c plan in light of pt progress.    Recommendations for follow up therapy are one component of a multi-disciplinary discharge planning process, led by the attending physician.  Recommendations may be updated based on patient status, additional functional criteria and insurance authorization.  Follow Up Recommendations  Outpatient PT Can patient physically be transported by private vehicle: Yes   Assistance Recommended at Discharge Frequent or constant Supervision/Assistance  Patient can return home with the following Assistance with cooking/housework;Assist for transportation;Help with stairs or ramp for entrance;Direct supervision/assist for medications management;Direct supervision/assist for financial management   Equipment Recommendations  Rollator (4 wheels)    Recommendations for Other Services       Precautions /  Restrictions Precautions Precautions: Fall Restrictions Weight Bearing Restrictions: No     Mobility  Bed Mobility Overal bed mobility: Modified Independent                  Transfers Overall transfer level: Needs assistance Equipment used: Rolling walker (2 wheels) Transfers: Sit to/from Stand Sit to Stand: Supervision                Ambulation/Gait Ambulation/Gait assistance: Supervision, Min guard Gait Distance (Feet): 300 Feet Assistive device: Rolling walker (2 wheels), Rollator (4 wheels) Gait Pattern/deviations: Step-through pattern, Decreased stride length Gait velocity: decreased     General Gait Details: Moderate reliance through arms on walker, supervision with device, min guard without device.   Stairs Stairs: Yes Stairs assistance: Supervision Stair Management: Two rails Number of Stairs: 6 General stair comments: step over step ascending, step by step descending   Wheelchair Mobility    Modified Rankin (Stroke Patients Only)       Balance Overall balance assessment: Needs assistance Sitting-balance support: No upper extremity supported, Feet supported Sitting balance-Leahy Scale: Good     Standing balance support: During functional activity, No upper extremity supported Standing balance-Leahy Scale: Fair                              Cognition Arousal/Alertness: Awake/alert Behavior During Therapy: Flat affect Overall Cognitive Status: No family/caregiver present to determine baseline cognitive functioning Area of Impairment: Attention, Problem solving, Awareness, Memory                   Current Attention Level: Selective Memory: Decreased short-term memory     Awareness: Emergent Problem Solving: Slow processing, Decreased initiation, Requires verbal cues General Comments: decreased STM        Exercises  General Comments General comments (skin integrity, edema, etc.): patient reports eager to  go home instead of rehab, she said she will have 24/7 support      Pertinent Vitals/Pain Pain Assessment Pain Assessment: No/denies pain    Home Living                          Prior Function            PT Goals (current goals can now be found in the care plan section) Acute Rehab PT Goals Potential to Achieve Goals: Good Progress towards PT goals: Progressing toward goals    Frequency    Min 3X/week      PT Plan Discharge plan needs to be updated    Co-evaluation              AM-PAC PT "6 Clicks" Mobility   Outcome Measure  Help needed turning from your back to your side while in a flat bed without using bedrails?: None Help needed moving from lying on your back to sitting on the side of a flat bed without using bedrails?: None Help needed moving to and from a bed to a chair (including a wheelchair)?: A Little Help needed standing up from a chair using your arms (e.g., wheelchair or bedside chair)?: A Little Help needed to walk in hospital room?: A Little Help needed climbing 3-5 steps with a railing? : A Little 6 Click Score: 20    End of Session Equipment Utilized During Treatment: Gait belt Activity Tolerance: Patient tolerated treatment well Patient left: in bed;with call bell/phone within reach Nurse Communication: Mobility status PT Visit Diagnosis: Muscle weakness (generalized) (M62.81)     Time: [id number] PT Time Calculation (min) (ACUTE ONLY): 17 min  Charges:  $Therapeutic Activity: 8-22 mins                     Wyona Almas, PT, DPT Acute Rehabilitation Services Office [phone]    Deno Etienne 03/18/2022, 12:35 PM

## 2022-03-18 NOTE — TOC Transition Note (Signed)
Transition of Care [facility]) - CM/SW Discharge Note   Patient Details  Name: Erica Banks MRN: [id number] Date of Birth: [birth year]  Transition of Care [facility]) CM/SW Contact:  Pollie Friar, RN Phone Number: 03/18/2022, 11:49 AM   Clinical Narrative:    Pt did much better with therapy today and recommendations changed to outpatient therapy. CM met with the patient and her mother and they agreed on Micro therapy. CM called and spouse agreed on the plans and place for rehab.  Pt to have rollator and shower stool delivered to the room per [employer] prior to d/c.  Medications for home also to be delivered to the room.  CM went over the need for family to assist with her medications at home and provide supervision. Mother voiced understanding. Family to transport home.   Final next level of care: OP Rehab Barriers to Discharge: No Barriers Identified   Patient Goals and CMS Choice CMS Medicare.gov Compare Post Acute Care list provided to:: Patient Choice offered to / list presented to : Patient  Discharge Placement                         Discharge Plan and Services Additional resources added to the After Visit Summary for       Post Acute Care Choice: [place]          DME Arranged: Shower stool, Walker rolling with seat DME Agency: [employer] Date DME Agency Contacted: 03/18/22   Representative spoke with at DME Agency: [place] Determinants of Health ([place]) Interventions SDOH Screenings   Food Insecurity: No Food Insecurity (03/08/2022)  Housing: Low Risk  (03/08/2022)  Transportation Needs: No Transportation Needs (03/08/2022)  Utilities: Not At Risk (03/08/2022)  Tobacco Use: High Risk (03/13/2022)     Readmission Risk Interventions    02/14/2022    2:11 PM 12/19/2021    8:54 AM  Readmission Risk Prevention Plan  Transportation Screening Complete Complete  HRI or Home Care Consult Complete Complete  Social Work  Consult for [place] Planning/Counseling Complete Complete  Palliative Care Screening Not Applicable Not Applicable  Medication Review Press photographer) Complete Complete

## 2022-03-18 NOTE — Progress Notes (Signed)
Occupational Therapy Treatment Patient Details Name: Erica Banks MRN: [id number] DOB: 1980/11/27 Today's Date: 03/18/2022   History of present illness Erica Banks is a 42 y.o. 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  Intubated for airway protection on 03/01/22 until 03/07/22. Patient was recently discharged from [facility] on 02/25/2022 for similar episode of encephalopathy secondary to cirrhosis and worsening ascites. Pt with PHM significant for but not limited to:  chronic anemia of chronic disease, alcoholic cirrhosis, neuropathy, esophageal varices, history of DVT/PE.   OT comments  Patient supine in bed, reports fatigued but agreeable to OT session.  She completes LB dressing with min guard, transfers and mobility in room with min guard using RW and grooming standing at sink with min guard.  Patient reports eager to dc home, voiced having 24/7 support at dc.  Discussed use of [place] for bathing instead of sitting down in tub, pt agreeable. DC plan updated to outpatient OT. Will follow.    Recommendations for follow up therapy are one component of a multi-disciplinary discharge planning process, led by the attending physician.  Recommendations may be updated based on patient status, additional functional criteria and insurance authorization.    Follow Up Recommendations  Outpatient OT     Assistance Recommended at Discharge Frequent or constant Supervision/Assistance  Patient can return home with the following  Assistance with cooking/housework;Assist for transportation;A little help with walking and/or transfers;A little help with bathing/dressing/bathroom;Direct supervision/assist for medications management;Direct supervision/assist for financial management;Help with stairs or ramp for entrance   Equipment Recommendations  Tub/shower seat    Recommendations for Other Services      Precautions / Restrictions Precautions Precautions:  Fall Restrictions Weight Bearing Restrictions: No       Mobility Bed Mobility Overal bed mobility: Needs Assistance Bed Mobility: Supine to Sit, Sit to Supine     Supine to sit: Supervision Sit to supine: Supervision   General bed mobility comments: no assist required    Transfers Overall transfer level: Needs assistance Equipment used: Rolling walker (2 wheels) Transfers: Sit to/from Stand Sit to Stand: Min guard           General transfer comment: for safety, mild instability     Balance Overall balance assessment: Needs assistance Sitting-balance support: No upper extremity supported, Feet supported Sitting balance-Leahy Scale: Good     Standing balance support: Bilateral upper extremity supported, During functional activity, No upper extremity supported Standing balance-Leahy Scale: Fair Standing balance comment: relies on UB support dynamically but able to engage in grooming standing at sink without UE support                           ADL either performed or assessed with clinical judgement   ADL Overall ADL's : Needs assistance/impaired     Grooming: Wash/dry hands;Min guard;Standing Grooming Details (indicate cue type and reason): washing hands at sink with min guard             Lower Body Dressing: Min guard;Sit to/from stand Lower Body Dressing Details (indicate cue type and reason): donning socks with setup using figure 4 technique, min guard in standing Toilet Transfer: Min guard;Ambulation;Rolling walker (2 wheels) Toilet Transfer Details (indicate cue type and reason): simulated in room         Functional mobility during ADLs: Min guard;Rolling walker (2 wheels) General ADL Comments: pt progressing well, decreased activity tolerance    Extremity/Trunk Assessment Upper Extremity Assessment  Upper Extremity Assessment: Generalized weakness            Vision       Perception     Praxis      Cognition  Arousal/Alertness: Awake/alert Behavior During Therapy: Flat affect Overall Cognitive Status: No family/caregiver present to determine baseline cognitive functioning Area of Impairment: Attention, Following commands, Problem solving, Awareness, Memory                   Current Attention Level: Selective Memory: Decreased short-term memory Following Commands: Follows one step commands consistently, Follows one step commands with increased time   Awareness: Emergent Problem Solving: Slow processing, Decreased initiation, Requires verbal cues General Comments: patient with slow processing and requires increased time for tasks.  She is follows commands well.        Exercises      Shoulder Instructions       General Comments patient reports eager to go home instead of rehab, she said she will have 24/7 support    Pertinent Vitals/ Pain       Pain Assessment Pain Assessment: No/denies pain  Home Living                                          Prior Functioning/Environment              Frequency  Min 2X/week        Progress Toward Goals  OT Goals(current goals can now be found in the care plan section)  Progress towards OT goals: Progressing toward goals  Acute Rehab OT Goals Patient Stated Goal: home OT Goal Formulation: With patient Time For Goal Achievement: 03/23/22 Potential to Achieve Goals: [place] Discharge plan needs to be updated;Frequency remains appropriate    Co-evaluation                 AM-PAC OT "6 Clicks" Daily Activity     Outcome Measure   Help from another person eating meals?: A Little Help from another person taking care of personal grooming?: A Little Help from another person toileting, which includes using toliet, bedpan, or urinal?: A Little Help from another person bathing (including washing, rinsing, drying)?: A Little Help from another person to put on and taking off regular upper body  clothing?: A Little Help from another person to put on and taking off regular lower body clothing?: A Little 6 Click Score: 18    End of Session Equipment Utilized During Treatment: Gait belt;Rolling walker (2 wheels)  OT Visit Diagnosis: Other abnormalities of gait and mobility (R26.89);Muscle weakness (generalized) (M62.81);Other symptoms and signs involving cognitive function   Activity Tolerance Patient tolerated treatment well   Patient Left in bed;with call bell/phone within reach;with bed alarm set   Nurse Communication Mobility status        Time: 1012-1030 OT Time Calculation (min): 18 min  Charges: OT General Charges $OT Visit: 1 Visit OT Treatments $Self Care/Home Management : 8-22 mins  Jolaine Artist, OT Acute Rehabilitation Services Office [phone]   Delight Stare 03/18/2022, 11:54 AM

## 2022-03-19 ENCOUNTER — Ambulatory Visit: Payer: Medicaid Other | Admitting: Gastroenterology

## 2022-03-20 ENCOUNTER — Inpatient Hospital Stay (HOSPITAL_COMMUNITY)
Admission: EM | Admit: 2022-03-20 | Discharge: 2022-03-26 | DRG: 432 | Disposition: A | Discharge status: Home Health | Payer: Medicaid Other | Attending: Internal Medicine | Admitting: Internal Medicine

## 2022-03-20 ENCOUNTER — Encounter (HOSPITAL_COMMUNITY): Payer: Self-pay | Admitting: *Deleted

## 2022-03-20 ENCOUNTER — Other Ambulatory Visit: Payer: Self-pay

## 2022-03-20 DIAGNOSIS — Z8249 Family history of ischemic heart disease and other diseases of the circulatory system: Secondary | ICD-10-CM

## 2022-03-20 DIAGNOSIS — K2289 Other specified disease of esophagus: Secondary | ICD-10-CM

## 2022-03-20 DIAGNOSIS — I959 Hypotension, unspecified: Secondary | ICD-10-CM | POA: Diagnosis present

## 2022-03-20 DIAGNOSIS — K7210 Chronic hepatic failure without coma: Secondary | ICD-10-CM | POA: Diagnosis present

## 2022-03-20 DIAGNOSIS — E860 Dehydration: Secondary | ICD-10-CM | POA: Diagnosis present

## 2022-03-20 DIAGNOSIS — E46 Unspecified protein-calorie malnutrition: Secondary | ICD-10-CM | POA: Diagnosis present

## 2022-03-20 DIAGNOSIS — K922 Gastrointestinal hemorrhage, unspecified: Principal | ICD-10-CM

## 2022-03-20 DIAGNOSIS — Z86711 Personal history of pulmonary embolism: Secondary | ICD-10-CM

## 2022-03-20 DIAGNOSIS — F17210 Nicotine dependence, cigarettes, uncomplicated: Secondary | ICD-10-CM | POA: Diagnosis present

## 2022-03-20 DIAGNOSIS — K7682 Hepatic encephalopathy: Secondary | ICD-10-CM

## 2022-03-20 DIAGNOSIS — D62 Acute posthemorrhagic anemia: Secondary | ICD-10-CM | POA: Diagnosis present

## 2022-03-20 DIAGNOSIS — E871 Hypo-osmolality and hyponatremia: Secondary | ICD-10-CM | POA: Diagnosis present

## 2022-03-20 DIAGNOSIS — K429 Umbilical hernia without obstruction or gangrene: Secondary | ICD-10-CM | POA: Diagnosis present

## 2022-03-20 DIAGNOSIS — I8511 Secondary esophageal varices with bleeding: Secondary | ICD-10-CM | POA: Diagnosis present

## 2022-03-20 DIAGNOSIS — Z6824 Body mass index (BMI) 24.0-24.9, adult: Secondary | ICD-10-CM

## 2022-03-20 DIAGNOSIS — G629 Polyneuropathy, unspecified: Secondary | ICD-10-CM | POA: Diagnosis present

## 2022-03-20 DIAGNOSIS — Z833 Family history of diabetes mellitus: Secondary | ICD-10-CM

## 2022-03-20 DIAGNOSIS — Z79899 Other long term (current) drug therapy: Secondary | ICD-10-CM

## 2022-03-20 DIAGNOSIS — Z7682 Awaiting organ transplant status: Secondary | ICD-10-CM

## 2022-03-20 DIAGNOSIS — D689 Coagulation defect, unspecified: Secondary | ICD-10-CM | POA: Diagnosis present

## 2022-03-20 DIAGNOSIS — Z86718 Personal history of other venous thrombosis and embolism: Secondary | ICD-10-CM

## 2022-03-20 DIAGNOSIS — Z885 Allergy status to narcotic agent status: Secondary | ICD-10-CM

## 2022-03-20 DIAGNOSIS — Z9049 Acquired absence of other specified parts of digestive tract: Secondary | ICD-10-CM

## 2022-03-20 DIAGNOSIS — Z825 Family history of asthma and other chronic lower respiratory diseases: Secondary | ICD-10-CM

## 2022-03-20 DIAGNOSIS — K766 Portal hypertension: Secondary | ICD-10-CM | POA: Diagnosis present

## 2022-03-20 DIAGNOSIS — K7031 Alcoholic cirrhosis of liver with ascites: Principal | ICD-10-CM | POA: Diagnosis present

## 2022-03-20 DIAGNOSIS — K5090 Crohn's disease, unspecified, without complications: Secondary | ICD-10-CM | POA: Diagnosis present

## 2022-03-20 DIAGNOSIS — E876 Hypokalemia: Secondary | ICD-10-CM | POA: Diagnosis present

## 2022-03-20 DIAGNOSIS — K3189 Other diseases of stomach and duodenum: Secondary | ICD-10-CM | POA: Diagnosis present

## 2022-03-20 LAB — PROTIME-INR
INR: 2.4 — ABNORMAL HIGH (ref 0.8–1.2)
Prothrombin Time: 25.7 seconds — ABNORMAL HIGH (ref 11.4–15.2)

## 2022-03-20 LAB — I-STAT CHEM 8, ED
BUN: 15 mg/dL (ref 6–20)
Calcium, Ion: 1.1 mmol/L — ABNORMAL LOW (ref 1.15–1.40)
Chloride: 99 mmol/L (ref 98–111)
Creatinine, Ser: 0.9 mg/dL (ref 0.44–1.00)
Glucose, Bld: 186 mg/dL — ABNORMAL HIGH (ref 70–99)
HCT: 18 % — ABNORMAL LOW (ref 36.0–46.0)
Hemoglobin: 6.1 g/dL — CL (ref 12.0–15.0)
Potassium: 3.1 mmol/L — ABNORMAL LOW (ref 3.5–5.1)
Sodium: 134 mmol/L — ABNORMAL LOW (ref 135–145)
TCO2: 19 mmol/L — ABNORMAL LOW (ref 22–32)

## 2022-03-20 LAB — PREPARE RBC (CROSSMATCH)

## 2022-03-20 MED ORDER — SODIUM CHLORIDE 0.9 % IV SOLN
50.0000 ug/h | INTRAVENOUS | Status: DC
  Administered 2022-03-21 – 2022-03-23 (×7): 50 ug/h via INTRAVENOUS
  Filled 2022-03-20 (×9): qty 1

## 2022-03-20 MED ORDER — METOCLOPRAMIDE HCL 5 MG/ML IJ SOLN
10.0000 mg | Freq: Once | INTRAMUSCULAR | Status: AC
  Administered 2022-03-20: 10 mg via INTRAVENOUS
  Filled 2022-03-20: qty 2

## 2022-03-20 MED ORDER — PANTOPRAZOLE 80MG IVPB - SIMPLE MED
80.0000 mg | Freq: Once | INTRAVENOUS | Status: AC
  Administered 2022-03-20: 80 mg via INTRAVENOUS
  Filled 2022-03-20: qty 100

## 2022-03-20 MED ORDER — SODIUM CHLORIDE 0.9% IV SOLUTION: Freq: Once | INTRAVENOUS | Status: AC

## 2022-03-20 MED ORDER — SODIUM CHLORIDE 0.9 % IV SOLN
2.0000 g | Freq: Once | INTRAVENOUS | Status: AC
  Administered 2022-03-20: 2 g via INTRAVENOUS
  Filled 2022-03-20: qty 20

## 2022-03-20 MED ORDER — OCTREOTIDE LOAD VIA INFUSION
50.0000 ug | Freq: Once | INTRAVENOUS | Status: AC
  Administered 2022-03-21: 50 ug via INTRAVENOUS
  Filled 2022-03-20: qty 25

## 2022-03-20 MED ORDER — PANTOPRAZOLE SODIUM 40 MG IV SOLR: 40.0000 mg | Freq: Two times a day (BID) | INTRAVENOUS | Status: DC

## 2022-03-20 MED ORDER — PANTOPRAZOLE INFUSION (NEW) - SIMPLE MED
8.0000 mg/h | INTRAVENOUS | Status: DC
  Administered 2022-03-21 – 2022-03-22 (×4): 8 mg/h via INTRAVENOUS
  Filled 2022-03-20 (×4): qty 100

## 2022-03-20 NOTE — ED Provider Notes (Signed)
[facility] Provider Note   CSN: [id number] Arrival date & time: 03/20/22  2238     History {Add pertinent medical, surgical, social history, OB history to HPI:1} Chief Complaint  Patient presents with   Abdominal Pain   Nausea   Emesis    Erica Banks is a 42 y.o. female.  Patient presents to the emergency department for evaluation of vomiting blood.  Patient with prior history of cirrhosis and esophageal varices.  Patient hypotensive when EMS picked her up.  She has been given approximately 1 L of fluids during transport.  Systolic blood pressure improved from the 70s to 90s.       Home Medications Prior to Admission medications   Medication Sig Start Date End Date Taking? Authorizing Provider  ciprofloxacin (CIPRO) 500 MG tablet Take 1 tablet (500 mg total) by mouth daily with breakfast. 03/19/22   Cherene Altes, MD  dronabinol (MARINOL) 2.5 MG capsule Take 1 capsule (2.5 mg total) by mouth 2 (two) times daily before lunch and supper. 03/18/22   Cherene Altes, MD  folic acid (FOLVITE) 1 MG tablet Take 1 tablet (1 mg total) by mouth daily. 03/19/22   Cherene Altes, MD  furosemide (LASIX) 40 MG tablet Take 1 tablet (40 mg total) by mouth 2 (two) times daily. 03/18/22   Cherene Altes, MD  lactulose (CHRONULAC) 10 GM/15ML solution Take 15 mLs (10 g total) by mouth 3 (three) times daily. 03/18/22 06/16/22  Cherene Altes, MD  Multiple Vitamin (MULTIVITAMIN WITH MINERALS) TABS tablet Take 1 tablet by mouth daily. 03/18/22   Cherene Altes, MD  pantoprazole (PROTONIX) 40 MG tablet Take 1 tablet (40 mg total) by mouth 2 (two) times daily before a meal. 03/18/22   Cherene Altes, MD  rifaximin (XIFAXAN) 550 MG TABS tablet Take 1 tablet (550 mg total) by mouth 2 (two) times daily. 03/18/22 06/16/22  Cherene Altes, MD  spironolactone (ALDACTONE) 50 MG tablet Take 1 tablet (50 mg total) by mouth daily. 03/19/22    Cherene Altes, MD  thiamine (VITAMIN B1) 100 MG tablet Take 1 tablet (100 mg total) by mouth daily. 03/19/22   Cherene Altes, MD      Allergies    Demerol    Review of Systems   Review of Systems  Physical Exam Updated Vital Signs BP (!) 90/53 (BP Location: Right Arm)   Pulse 99   Temp 98.7 F (37.1 C) (Oral)   Resp (!) 22   Ht 5' 6"$  (1.676 m)   Wt 68 kg   LMP  (LMP Unknown)   SpO2 100%   BMI 24.21 kg/m  Physical Exam Vitals and nursing note reviewed.  Constitutional:      General: She is not in acute distress.    Appearance: She is well-developed. She is ill-appearing.  HENT:     Head: Normocephalic and atraumatic.     Mouth/Throat:     Mouth: Mucous membranes are moist.  Eyes:     General: Vision grossly intact. Gaze aligned appropriately.     Extraocular Movements: Extraocular movements intact.     Conjunctiva/sclera: Conjunctivae normal.  Cardiovascular:     Rate and Rhythm: Regular rhythm. Tachycardia present.     Pulses: Normal pulses.     Heart sounds: Normal heart sounds, S1 normal and S2 normal. No murmur heard.    No friction rub. No gallop.  Pulmonary:     Effort:  Pulmonary effort is normal. No respiratory distress.     Breath sounds: Normal breath sounds.  Abdominal:     General: There is distension.     Tenderness: There is no abdominal tenderness. There is no guarding or rebound.     Hernia: No hernia is present.  Musculoskeletal:        General: No swelling.     Cervical back: Full passive range of motion without pain, normal range of motion and neck supple. No spinous process tenderness or muscular tenderness. Normal range of motion.     Right lower leg: No edema.     Left lower leg: No edema.  Skin:    General: Skin is warm and dry.     Capillary Refill: Capillary refill takes less than 2 seconds.     Coloration: Skin is jaundiced.     Findings: No ecchymosis, erythema, rash or wound.  Neurological:     General: No focal deficit  present.     Mental Status: She is alert and oriented to person, place, and time.     GCS: GCS eye subscore is 4. GCS verbal subscore is 5. GCS motor subscore is 6.     Cranial Nerves: Cranial nerves 2-12 are intact.     Sensory: Sensation is intact.     Motor: Motor function is intact.     Coordination: Coordination is intact.  Psychiatric:        Attention and Perception: Attention normal.        Mood and Affect: Mood normal.        Speech: Speech normal.        Behavior: Behavior normal.     ED Results / Procedures / Treatments   Labs (all labs ordered are listed, but only abnormal results are displayed) Labs Reviewed  I-STAT CHEM 8, ED - Abnormal; Notable for the following components:      Result Value   Sodium 134 (*)    Potassium 3.1 (*)    Glucose, Bld 186 (*)    Calcium, Ion 1.10 (*)    TCO2 19 (*)    Hemoglobin 6.1 (*)    HCT 18.0 (*)    All other components within normal limits  CBC WITH DIFFERENTIAL/PLATELET  BASIC METABOLIC PANEL  HEPATIC FUNCTION PANEL  LIPASE, BLOOD  PROTIME-INR  AMMONIA  TYPE AND SCREEN  PREPARE RBC (CROSSMATCH)    EKG None  Radiology No results found.  Procedures Procedures  {Document cardiac monitor, telemetry assessment procedure when appropriate:1}  Medications Ordered in ED Medications  cefTRIAXone (ROCEPHIN) 2 g in sodium chloride 0.9 % 100 mL IVPB (has no administration in time range)  octreotide (SANDOSTATIN) 2 mcg/mL load via infusion 50 mcg (has no administration in time range)    And  octreotide (SANDOSTATIN) 500 mcg in sodium chloride 0.9 % 250 mL (2 mcg/mL) infusion (has no administration in time range)  pantoprazole (PROTONIX) 80 mg /NS 100 mL IVPB (has no administration in time range)  pantoprozole (PROTONIX) 80 mg /NS 100 mL infusion (has no administration in time range)  pantoprazole (PROTONIX) injection 40 mg (has no administration in time range)  0.9 %  sodium chloride infusion (Manually program via  Guardrails IV Fluids) (has no administration in time range)  0.9 %  sodium chloride infusion (Manually program via Guardrails IV Fluids) (has no administration in time range)  metoCLOPramide (REGLAN) injection 10 mg (10 mg Intravenous Given 03/20/22 2319)    ED Course/ Medical Decision Making/ A&P   {  Click here for ABCD2, HEART and other calculatorsREFRESH Note before signing :1}                          Medical Decision Making Amount and/or Complexity of Data Reviewed Labs: ordered.  Risk Prescription drug management.   ***  {Document critical care time when appropriate:1} {Document review of labs and clinical decision tools ie heart score, Chads2Vasc2 etc:1}  {Document your independent review of radiology images, and any outside records:1} {Document your discussion with family members, caretakers, and with consultants:1} {Document social determinants of health affecting pt's care:1} {Document your decision making why or why not admission, treatments were needed:1} Final Clinical Impression(s) / ED Diagnoses Final diagnoses:  None    Rx / DC Orders ED Discharge Orders     None

## 2022-03-20 NOTE — ED Triage Notes (Signed)
Patient arrives from home with n/v and reported hematemesis.  Patient with 15-20 cc of blood per ems.  Patient has known liver disease.  She has noted abd ascities.  She continues to have nausea and vomitting.  Patient reports she is on the transplant list.  Patient was d/c from here this week on Monday.  Patient is taking her meds as directed.  She is dry and noted to be jaundice in color.  She was hypotensive with ems, bp reported to be in the 70's.  Patient received 900 cc fluid by ems.  She has Iv in the left ac

## 2022-03-21 ENCOUNTER — Encounter (HOSPITAL_COMMUNITY): Payer: Self-pay | Admitting: Internal Medicine

## 2022-03-21 ENCOUNTER — Inpatient Hospital Stay (HOSPITAL_COMMUNITY): Payer: Medicaid Other | Admitting: Certified Registered Nurse Anesthetist

## 2022-03-21 ENCOUNTER — Encounter (HOSPITAL_COMMUNITY)
Admission: EM | Disposition: A | Discharge status: Home Health | Payer: Self-pay | Source: Home / Self Care | Attending: Internal Medicine

## 2022-03-21 ENCOUNTER — Inpatient Hospital Stay (HOSPITAL_COMMUNITY): Payer: Medicaid Other

## 2022-03-21 DIAGNOSIS — K7682 Hepatic encephalopathy: Secondary | ICD-10-CM | POA: Diagnosis present

## 2022-03-21 DIAGNOSIS — K7210 Chronic hepatic failure without coma: Secondary | ICD-10-CM | POA: Diagnosis present

## 2022-03-21 DIAGNOSIS — K429 Umbilical hernia without obstruction or gangrene: Secondary | ICD-10-CM | POA: Diagnosis present

## 2022-03-21 DIAGNOSIS — E46 Unspecified protein-calorie malnutrition: Secondary | ICD-10-CM | POA: Diagnosis present

## 2022-03-21 DIAGNOSIS — K920 Hematemesis: Secondary | ICD-10-CM | POA: Diagnosis present

## 2022-03-21 DIAGNOSIS — Z7189 Other specified counseling: Secondary | ICD-10-CM | POA: Diagnosis not present

## 2022-03-21 DIAGNOSIS — K766 Portal hypertension: Secondary | ICD-10-CM

## 2022-03-21 DIAGNOSIS — I959 Hypotension, unspecified: Secondary | ICD-10-CM | POA: Diagnosis present

## 2022-03-21 DIAGNOSIS — F419 Anxiety disorder, unspecified: Secondary | ICD-10-CM

## 2022-03-21 DIAGNOSIS — K921 Melena: Secondary | ICD-10-CM | POA: Diagnosis not present

## 2022-03-21 DIAGNOSIS — K2289 Other specified disease of esophagus: Secondary | ICD-10-CM | POA: Diagnosis not present

## 2022-03-21 DIAGNOSIS — Z885 Allergy status to narcotic agent status: Secondary | ICD-10-CM | POA: Diagnosis not present

## 2022-03-21 DIAGNOSIS — K7460 Unspecified cirrhosis of liver: Secondary | ICD-10-CM | POA: Diagnosis not present

## 2022-03-21 DIAGNOSIS — K5090 Crohn's disease, unspecified, without complications: Secondary | ICD-10-CM | POA: Diagnosis present

## 2022-03-21 DIAGNOSIS — E860 Dehydration: Secondary | ICD-10-CM | POA: Diagnosis present

## 2022-03-21 DIAGNOSIS — D62 Acute posthemorrhagic anemia: Secondary | ICD-10-CM | POA: Diagnosis present

## 2022-03-21 DIAGNOSIS — D509 Iron deficiency anemia, unspecified: Secondary | ICD-10-CM | POA: Diagnosis not present

## 2022-03-21 DIAGNOSIS — D689 Coagulation defect, unspecified: Secondary | ICD-10-CM | POA: Diagnosis present

## 2022-03-21 DIAGNOSIS — K7030 Alcoholic cirrhosis of liver without ascites: Secondary | ICD-10-CM | POA: Diagnosis not present

## 2022-03-21 DIAGNOSIS — G629 Polyneuropathy, unspecified: Secondary | ICD-10-CM | POA: Diagnosis present

## 2022-03-21 DIAGNOSIS — I8511 Secondary esophageal varices with bleeding: Secondary | ICD-10-CM | POA: Diagnosis present

## 2022-03-21 DIAGNOSIS — Z86711 Personal history of pulmonary embolism: Secondary | ICD-10-CM | POA: Diagnosis not present

## 2022-03-21 DIAGNOSIS — Z7682 Awaiting organ transplant status: Secondary | ICD-10-CM | POA: Diagnosis not present

## 2022-03-21 DIAGNOSIS — K922 Gastrointestinal hemorrhage, unspecified: Secondary | ICD-10-CM | POA: Diagnosis not present

## 2022-03-21 DIAGNOSIS — I8510 Secondary esophageal varices without bleeding: Secondary | ICD-10-CM | POA: Insufficient documentation

## 2022-03-21 DIAGNOSIS — E876 Hypokalemia: Secondary | ICD-10-CM | POA: Diagnosis present

## 2022-03-21 DIAGNOSIS — Z515 Encounter for palliative care: Secondary | ICD-10-CM | POA: Diagnosis not present

## 2022-03-21 DIAGNOSIS — K7031 Alcoholic cirrhosis of liver with ascites: Secondary | ICD-10-CM | POA: Diagnosis present

## 2022-03-21 DIAGNOSIS — E871 Hypo-osmolality and hyponatremia: Secondary | ICD-10-CM | POA: Diagnosis present

## 2022-03-21 DIAGNOSIS — F17210 Nicotine dependence, cigarettes, uncomplicated: Secondary | ICD-10-CM | POA: Diagnosis present

## 2022-03-21 DIAGNOSIS — K3189 Other diseases of stomach and duodenum: Secondary | ICD-10-CM

## 2022-03-21 DIAGNOSIS — Z86718 Personal history of other venous thrombosis and embolism: Secondary | ICD-10-CM | POA: Diagnosis not present

## 2022-03-21 DIAGNOSIS — Z79899 Other long term (current) drug therapy: Secondary | ICD-10-CM | POA: Diagnosis not present

## 2022-03-21 DIAGNOSIS — E870 Hyperosmolality and hypernatremia: Secondary | ICD-10-CM | POA: Diagnosis not present

## 2022-03-21 DIAGNOSIS — Z9049 Acquired absence of other specified parts of digestive tract: Secondary | ICD-10-CM | POA: Diagnosis not present

## 2022-03-21 DIAGNOSIS — Z833 Family history of diabetes mellitus: Secondary | ICD-10-CM | POA: Diagnosis not present

## 2022-03-21 HISTORY — PX: ESOPHAGEAL BANDING: SHX5518

## 2022-03-21 HISTORY — PX: ESOPHAGOGASTRODUODENOSCOPY (EGD) WITH PROPOFOL: SHX5813

## 2022-03-21 LAB — CBC WITH DIFFERENTIAL/PLATELET
Abs Immature Granulocytes: 0.06 10*3/uL (ref 0.00–0.07)
Basophils Absolute: 0.1 10*3/uL (ref 0.0–0.1)
Basophils Relative: 1 %
Eosinophils Absolute: 0.2 10*3/uL (ref 0.0–0.5)
Eosinophils Relative: 2 %
HCT: 19.3 % — ABNORMAL LOW (ref 36.0–46.0)
Hemoglobin: 6.1 g/dL — CL (ref 12.0–15.0)
Immature Granulocytes: 1 %
Lymphocytes Relative: 25 %
Lymphs Abs: 2.6 10*3/uL (ref 0.7–4.0)
MCH: 30 pg (ref 26.0–34.0)
MCHC: 31.6 g/dL (ref 30.0–36.0)
MCV: 95.1 fL (ref 80.0–100.0)
Monocytes Absolute: 1.2 10*3/uL — ABNORMAL HIGH (ref 0.1–1.0)
Monocytes Relative: 12 %
Neutro Abs: 6.1 10*3/uL (ref 1.7–7.7)
Neutrophils Relative %: 59 %
Platelets: 242 10*3/uL (ref 150–400)
RBC: 2.03 MIL/uL — ABNORMAL LOW (ref 3.87–5.11)
RDW: 20 % — ABNORMAL HIGH (ref 11.5–15.5)
WBC: 10.4 10*3/uL (ref 4.0–10.5)
nRBC: 0 % (ref 0.0–0.2)

## 2022-03-21 LAB — CBC
HCT: 32.2 % — ABNORMAL LOW (ref 36.0–46.0)
Hemoglobin: 10.6 g/dL — ABNORMAL LOW (ref 12.0–15.0)
MCH: 29.1 pg (ref 26.0–34.0)
MCHC: 32.9 g/dL (ref 30.0–36.0)
MCV: 88.5 fL (ref 80.0–100.0)
Platelets: 293 10*3/uL (ref 150–400)
RBC: 3.64 MIL/uL — ABNORMAL LOW (ref 3.87–5.11)
RDW: 18.3 % — ABNORMAL HIGH (ref 11.5–15.5)
WBC: 18.5 10*3/uL — ABNORMAL HIGH (ref 4.0–10.5)
nRBC: 0 % (ref 0.0–0.2)

## 2022-03-21 LAB — BASIC METABOLIC PANEL
Anion gap: 10 (ref 5–15)
Anion gap: 9 (ref 5–15)
BUN: 15 mg/dL (ref 6–20)
BUN: 20 mg/dL (ref 6–20)
CO2: 18 mmol/L — ABNORMAL LOW (ref 22–32)
CO2: 21 mmol/L — ABNORMAL LOW (ref 22–32)
Calcium: 7.5 mg/dL — ABNORMAL LOW (ref 8.9–10.3)
Calcium: 7.9 mg/dL — ABNORMAL LOW (ref 8.9–10.3)
Chloride: 104 mmol/L (ref 98–111)
Chloride: 105 mmol/L (ref 98–111)
Creatinine, Ser: 0.97 mg/dL (ref 0.44–1.00)
Creatinine, Ser: 0.98 mg/dL (ref 0.44–1.00)
GFR, Estimated: >60 mL/min (ref >60)
GFR, Estimated: >60 mL/min (ref >60)
Glucose, Bld: 145 mg/dL — ABNORMAL HIGH (ref 70–99)
Glucose, Bld: 197 mg/dL — ABNORMAL HIGH (ref 70–99)
Potassium: 3 mmol/L — ABNORMAL LOW (ref 3.5–5.1)
Potassium: 4.5 mmol/L (ref 3.5–5.1)
Sodium: 132 mmol/L — ABNORMAL LOW (ref 135–145)
Sodium: 135 mmol/L (ref 135–145)

## 2022-03-21 LAB — POCT I-STAT 7, (LYTES, BLD GAS, ICA,H+H)
Acid-base deficit: 7 mmol/L — ABNORMAL HIGH (ref 0.0–2.0)
Acid-base deficit: 7 mmol/L — ABNORMAL HIGH (ref 0.0–2.0)
Bicarbonate: 21.4 mmol/L (ref 20.0–28.0)
Bicarbonate: 18.4 mmol/L — ABNORMAL LOW (ref 20.0–28.0)
Calcium, Ion: 1.2 mmol/L (ref 1.15–1.40)
Calcium, Ion: 1.21 mmol/L (ref 1.15–1.40)
HCT: 31 % — ABNORMAL LOW (ref 36.0–46.0)
HCT: 30 % — ABNORMAL LOW (ref 36.0–46.0)
Hemoglobin: 10.5 g/dL — ABNORMAL LOW (ref 12.0–15.0)
Hemoglobin: 10.2 g/dL — ABNORMAL LOW (ref 12.0–15.0)
O2 Saturation: 100 %
O2 Saturation: 93 %
Patient temperature: 98.4
Patient temperature: 97.5
Potassium: 4.5 mmol/L (ref 3.5–5.1)
Potassium: 4.5 mmol/L (ref 3.5–5.1)
Sodium: 134 mmol/L — ABNORMAL LOW (ref 135–145)
Sodium: 134 mmol/L — ABNORMAL LOW (ref 135–145)
TCO2: 23 mmol/L (ref 22–32)
TCO2: 19 mmol/L — ABNORMAL LOW (ref 22–32)
pCO2 arterial: 54.4 mmHg — ABNORMAL HIGH (ref 32–48)
pCO2 arterial: 32.8 mmHg (ref 32–48)
pH, Arterial: 7.203 — ABNORMAL LOW (ref 7.35–7.45)
pH, Arterial: 7.353 (ref 7.35–7.45)
pO2, Arterial: 356 mmHg — ABNORMAL HIGH (ref 83–108)
pO2, Arterial: 69 mmHg — ABNORMAL LOW (ref 83–108)

## 2022-03-21 LAB — GLUCOSE, CAPILLARY
Glucose-Capillary: 104 mg/dL — ABNORMAL HIGH (ref 70–99)
Glucose-Capillary: 121 mg/dL — ABNORMAL HIGH (ref 70–99)
Glucose-Capillary: 151 mg/dL — ABNORMAL HIGH (ref 70–99)

## 2022-03-21 LAB — HEMOGLOBIN AND HEMATOCRIT, BLOOD
HCT: 27.4 % — ABNORMAL LOW (ref 36.0–46.0)
HCT: 27.6 % — ABNORMAL LOW (ref 36.0–46.0)
HCT: 31.3 % — ABNORMAL LOW (ref 36.0–46.0)
HCT: 28.5 % — ABNORMAL LOW (ref 36.0–46.0)
Hemoglobin: 9.4 g/dL — ABNORMAL LOW (ref 12.0–15.0)
Hemoglobin: 9.5 g/dL — ABNORMAL LOW (ref 12.0–15.0)
Hemoglobin: 10.7 g/dL — ABNORMAL LOW (ref 12.0–15.0)
Hemoglobin: 9.5 g/dL — ABNORMAL LOW (ref 12.0–15.0)

## 2022-03-21 LAB — HEMOGLOBIN A1C
Hgb A1c MFr Bld: 5 % (ref 4.8–5.6)
Mean Plasma Glucose: 96.8 mg/dL

## 2022-03-21 LAB — HEPATIC FUNCTION PANEL
ALT: 35 U/L (ref 0–44)
AST: 43 U/L — ABNORMAL HIGH (ref 15–41)
Albumin: 2.4 g/dL — ABNORMAL LOW (ref 3.5–5.0)
Alkaline Phosphatase: 74 U/L (ref 38–126)
Bilirubin, Direct: 0.9 mg/dL — ABNORMAL HIGH (ref 0.0–0.2)
Indirect Bilirubin: 1.7 mg/dL — ABNORMAL HIGH (ref 0.3–0.9)
Total Bilirubin: 2.6 mg/dL — ABNORMAL HIGH (ref 0.3–1.2)
Total Protein: 4.4 g/dL — ABNORMAL LOW (ref 6.5–8.1)

## 2022-03-21 LAB — POTASSIUM: Potassium: 4.4 mmol/L (ref 3.5–5.1)

## 2022-03-21 LAB — PREPARE RBC (CROSSMATCH)

## 2022-03-21 LAB — BLOOD PRODUCT ORDER (VERBAL) VERIFICATION

## 2022-03-21 LAB — LIPASE, BLOOD: Lipase: 84 U/L — ABNORMAL HIGH (ref 11–51)

## 2022-03-21 LAB — MAGNESIUM: Magnesium: 1.4 mg/dL — ABNORMAL LOW (ref 1.7–2.4)

## 2022-03-21 LAB — AMMONIA: Ammonia: 34 umol/L (ref 9–35)

## 2022-03-21 LAB — MRSA NEXT GEN BY PCR, NASAL: MRSA by PCR Next Gen: NOT DETECTED

## 2022-03-21 SURGERY — ESOPHAGOGASTRODUODENOSCOPY (EGD) WITH PROPOFOL
Anesthesia: General

## 2022-03-21 MED ORDER — PROPOFOL 1000 MG/100ML IV EMUL
0.0000 ug/kg/min | INTRAVENOUS | Status: DC
  Administered 2022-03-21 – 2022-03-22 (×2): 30 ug/kg/min via INTRAVENOUS
  Filled 2022-03-21 (×2): qty 100

## 2022-03-21 MED ORDER — PROPOFOL 1000 MG/100ML IV EMUL
INTRAVENOUS | Status: AC
  Administered 2022-03-21: 50 ug/kg/min via INTRAVENOUS
  Filled 2022-03-21: qty 100

## 2022-03-21 MED ORDER — FENTANYL BOLUS VIA INFUSION: 50.0000 ug | INTRAVENOUS | Status: DC | PRN

## 2022-03-21 MED ORDER — ROCURONIUM BROMIDE 100 MG/10ML IV SOLN
INTRAVENOUS | Status: DC | PRN
  Administered 2022-03-21: 50 mg via INTRAVENOUS
  Administered 2022-03-21: 30 mg via INTRAVENOUS

## 2022-03-21 MED ORDER — SODIUM CHLORIDE 0.9 % IV SOLN
2.0000 g | INTRAVENOUS | Status: AC
  Administered 2022-03-21 – 2022-03-24 (×4): 2 g via INTRAVENOUS
  Filled 2022-03-21 (×4): qty 20

## 2022-03-21 MED ORDER — LACTATED RINGERS IV SOLN: INTRAVENOUS | Status: DC | PRN

## 2022-03-21 MED ORDER — FENTANYL CITRATE (PF) 250 MCG/5ML IJ SOLN
INTRAMUSCULAR | Status: DC | PRN
  Administered 2022-03-21: 50 ug via INTRAVENOUS

## 2022-03-21 MED ORDER — POTASSIUM CHLORIDE 10 MEQ/100ML IV SOLN
10.0000 meq | INTRAVENOUS | Status: AC
  Administered 2022-03-21: 10 meq via INTRAVENOUS
  Filled 2022-03-21: qty 100

## 2022-03-21 MED ORDER — POLYETHYLENE GLYCOL 3350 17 G PO PACK: 17.0000 g | PACK | Freq: Every day | ORAL | Status: DC

## 2022-03-21 MED ORDER — CHLORHEXIDINE GLUCONATE CLOTH 2 % EX PADS
6.0000 | MEDICATED_PAD | Freq: Every day | CUTANEOUS | Status: DC
  Administered 2022-03-21 – 2022-03-26 (×6): 6 via TOPICAL

## 2022-03-21 MED ORDER — POTASSIUM CHLORIDE 10 MEQ/100ML IV SOLN
10.0000 meq | INTRAVENOUS | Status: AC
  Administered 2022-03-21: 10 meq via INTRAVENOUS
  Filled 2022-03-21 (×2): qty 100

## 2022-03-21 MED ORDER — SODIUM CHLORIDE 0.9 % IV SOLN: INTRAVENOUS | Status: DC

## 2022-03-21 MED ORDER — DOCUSATE SODIUM 50 MG/5ML PO LIQD: 100.0000 mg | Freq: Two times a day (BID) | ORAL | Status: DC

## 2022-03-21 MED ORDER — FENTANYL CITRATE PF 50 MCG/ML IJ SOSY
50.0000 ug | PREFILLED_SYRINGE | Freq: Once | INTRAMUSCULAR | Status: AC
  Administered 2022-03-21: 50 ug via INTRAVENOUS
  Filled 2022-03-21: qty 1

## 2022-03-21 MED ORDER — ACETAMINOPHEN 325 MG PO TABS: 650.0000 mg | ORAL_TABLET | Freq: Four times a day (QID) | ORAL | Status: DC | PRN

## 2022-03-21 MED ORDER — FENTANYL 2500MCG IN NS 250ML (10MCG/ML) PREMIX INFUSION
50.0000 ug/h | INTRAVENOUS | Status: DC
  Administered 2022-03-21: 50 ug/h via INTRAVENOUS
  Filled 2022-03-21: qty 250

## 2022-03-21 MED ORDER — SODIUM CHLORIDE 0.9 % IV SOLN: 10.0000 mL/h | Freq: Once | INTRAVENOUS | Status: DC

## 2022-03-21 MED ORDER — FENTANYL CITRATE (PF) 100 MCG/2ML IJ SOLN
INTRAMUSCULAR | Status: AC
  Filled 2022-03-21: qty 2

## 2022-03-21 MED ORDER — FENTANYL CITRATE (PF) 100 MCG/2ML IJ SOLN: 50.0000 ug | Freq: Once | INTRAMUSCULAR | Status: DC

## 2022-03-21 MED ORDER — SODIUM BICARBONATE 8.4 % IV SOLN
50.0000 meq | Freq: Once | INTRAVENOUS | Status: AC
  Administered 2022-03-21: 50 meq via INTRAVENOUS
  Filled 2022-03-21: qty 50

## 2022-03-21 MED ORDER — ORAL CARE MOUTH RINSE
15.0000 mL | OROMUCOSAL | Status: DC
  Administered 2022-03-21 – 2022-03-22 (×9): 15 mL via OROMUCOSAL

## 2022-03-21 MED ORDER — LIDOCAINE 2% (20 MG/ML) 5 ML SYRINGE
INTRAMUSCULAR | Status: DC | PRN
  Administered 2022-03-21: 40 mg via INTRAVENOUS

## 2022-03-21 MED ORDER — INSULIN ASPART 100 UNIT/ML IJ SOLN
0.0000 [IU] | INTRAMUSCULAR | Status: DC
  Administered 2022-03-21 (×2): 1 [IU] via SUBCUTANEOUS

## 2022-03-21 MED ORDER — LACTATED RINGERS IV SOLN: INTRAVENOUS | Status: DC

## 2022-03-21 MED ORDER — SUCCINYLCHOLINE CHLORIDE 200 MG/10ML IV SOSY
PREFILLED_SYRINGE | INTRAVENOUS | Status: DC | PRN
  Administered 2022-03-21: 80 mg via INTRAVENOUS

## 2022-03-21 MED ORDER — SODIUM CHLORIDE 0.9% FLUSH
3.0000 mL | Freq: Two times a day (BID) | INTRAVENOUS | Status: DC
  Administered 2022-03-21 – 2022-03-25 (×10): 3 mL via INTRAVENOUS

## 2022-03-21 MED ORDER — ACETAMINOPHEN 650 MG RE SUPP: 650.0000 mg | Freq: Four times a day (QID) | RECTAL | Status: DC | PRN

## 2022-03-21 MED ORDER — THIAMINE HCL 100 MG/ML IJ SOLN
100.0000 mg | Freq: Every day | INTRAMUSCULAR | Status: DC
  Administered 2022-03-21 – 2022-03-23 (×3): 100 mg via INTRAVENOUS
  Filled 2022-03-21 (×3): qty 2

## 2022-03-21 MED ORDER — PROPOFOL 10 MG/ML IV BOLUS
INTRAVENOUS | Status: DC | PRN
  Administered 2022-03-21: 120 mg via INTRAVENOUS

## 2022-03-21 MED ORDER — ORAL CARE MOUTH RINSE: 15.0000 mL | OROMUCOSAL | Status: DC | PRN

## 2022-03-21 MED ORDER — MAGNESIUM SULFATE 4 GM/100ML IV SOLN
4.0000 g | Freq: Once | INTRAVENOUS | Status: AC
  Administered 2022-03-21: 4 g via INTRAVENOUS
  Filled 2022-03-21 (×2): qty 100

## 2022-03-21 SURGICAL SUPPLY — 15 items

## 2022-03-21 NOTE — ED Notes (Signed)
Pt given two additional warm blankets, BP is improving, pt reports is comfortable at this time

## 2022-03-21 NOTE — ED Notes (Signed)
Pt continues to rest, BP improvement, can observe even RR that are unlabored, pt remains on cardiac monitoring devices, no changes noted, side rails up x2 for safety, NAD noted, plan of care ongoing, advised waiting on inpatient bed at this time, pt verbalized understanding, pt given a few ice chips, call light within reach, no further concerns as of present.

## 2022-03-21 NOTE — Anesthesia Postprocedure Evaluation (Signed)
Anesthesia Post Note  Patient: Erica Banks  Procedure(s) Performed: ESOPHAGOGASTRODUODENOSCOPY (EGD) WITH PROPOFOL ESOPHAGEAL BANDING     Patient location during evaluation: PACU Anesthesia Type: General Level of consciousness: patient remains intubated per anesthesia plan Pain management: pain level controlled Vital Signs Assessment: vitals unstable Respiratory status: patient remains intubated per anesthesia plan and respiratory function unstable Cardiovascular status: stable Postop Assessment: no apparent nausea or vomiting Anesthetic complications: no Comments: Patient taken to ICU intubated due to bleeding. Given 1 unit of PRBC's during procedure.   No notable events documented.  Last Vitals:  Vitals:   03/21/22 1333 03/21/22 1543  BP: 126/75   Pulse: 95   Resp: 18   Temp: 36.9 C (!) 36.4 C  SpO2: 97%     Last Pain:  Vitals:   03/21/22 1543  TempSrc: Axillary  PainSc:                  Serin Thornell A.

## 2022-03-21 NOTE — ED Notes (Signed)
Pt had a small BM, brown with blood tinge noted, pt was cleaned and brief applied along with a new incontinent pad. Dr. Betsey Holiday notified that pt had blood tinge stool, which was reported not new, hemoccult performed at bedside, + for blood

## 2022-03-21 NOTE — ED Notes (Signed)
Pt appears to be comfortable and resting, can observe even RR that are unlabored, pt remains on cardiac monitoring devices, no changes noted, BP improvement side rails up x2 for safety, NAD noted, plan of care ongoing, call light within reach, no further concerns as of present.

## 2022-03-21 NOTE — ED Notes (Signed)
ED TO INPATIENT HANDOFF REPORT  ED Nurse Name and Phone #: [phone]  S Name/Age/Gender Erica Banks 42 y.o. female Room/Bed: 018C/018C  Code Status   Code Status: Full Code  Home/SNF/Other Home Patient oriented to: person, place, time, event Is this baseline? Yes   Triage Complete: Triage complete  Chief Complaint Esophageal bleed, non-variceal [K22.89]  Triage Note Patient arrives from home with n/v and reported hematemesis.  Patient with 15-20 cc of blood per ems.  Patient has known liver disease.  She has noted abd ascities.  She continues to have nausea and vomitting.  Patient reports she is on the transplant list.  Patient was d/c from here this week on Monday.  Patient is taking her meds as directed.  She is dry and noted to be jaundice in color.  She was hypotensive with ems, bp reported to be in the 70's.  Patient received 900 cc fluid by ems.  She has Iv in the left ac   Allergies Allergies  Allergen Reactions   Demerol Anaphylaxis    Per patient cardiac arrest    Level of Care/Admitting Diagnosis ED Disposition     ED Disposition  Admit   Condition  --   Comment  Hospital Area: [facility] [[id number]]  Level of Care: Progressive [102]  Admit to Progressive based on following criteria: GI, ENDOCRINE disease patients with GI bleeding, acute liver failure or pancreatitis, stable with diabetic ketoacidosis or thyrotoxicosis (hypothyroid) state.  May admit patient to [facility] or [facility] if equivalent level of care is available:: Yes  Covid Evaluation: Confirmed COVID Negative  Diagnosis: Esophageal bleed, non-variceal [390610]  Admitting Physician: Tawni Millers [id number]  Attending Physician: Samella Parr [id number]  Certification:: I certify this patient will need inpatient services for at least 2 midnights  Estimated Length of Stay: 4          B Medical/Surgery History Past Medical History:  Diagnosis Date   Anemia     Cirrhosis ([place])    DVT (deep venous thrombosis) ([place])    Low iron    Neuropathy    Neuropathy    Panic attacks    Pulmonary embolus ([place]) 2019   Past Surgical History:  Procedure Laterality Date   ABDOMINAL HYSTERECTOMY     BIOPSY N/A 06/24/2013   Procedure: BIOPSY TERMINAL ILEUM;  Surgeon: Daneil Dolin, MD;  [facility];  Service: Endoscopy;  Laterality: N/A;   CHOLECYSTECTOMY     COLONOSCOPY N/A 06/24/2013   [id number] hemorrhoids/otherwise normal    COLONOSCOPY WITH PROPOFOL N/A 05/31/2021   Surgeon: Daneil Dolin, MD; ery large nonbleeding internal and external hemorrhoids, portal colopathy.  Recommended repeat colonoscopy in 10 years.   ESOPHAGOGASTRODUODENOSCOPY (EGD) WITH PROPOFOL N/A 03/29/2021   Surgeon: Eloise Harman, DO;  grade 1 esophageal varices, portal hypertensive gastropathy with friable tissue with spontaneous ooze, normal duodenum.   ESOPHAGOGASTRODUODENOSCOPY (EGD) WITH PROPOFOL N/A 05/31/2021   Surgeon: Daneil Dolin, MD;  grade 1-2 esophageal varices without bleeding stigmata, advanced appearing portal hypertensive gastropathy.   ESOPHAGOGASTRODUODENOSCOPY (EGD) WITH PROPOFOL N/A 12/22/2021   Procedure: ESOPHAGOGASTRODUODENOSCOPY (EGD) WITH PROPOFOL;  Surgeon: Harvel Quale, MD;  [facility];  Service: Gastroenterology;  Laterality: N/A;   IR PARACENTESIS  03/13/2022   nasal bone surgery     NASAL SINUS SURGERY     SINUS IRRIGATION     TONSILLECTOMY     TUBAL LIGATION       A  IV Location/Drains/Wounds Patient Lines/Drains/Airways Status     Active Line/Drains/Airways     Name Placement date Placement time Site Days   Peripheral IV 03/20/22 20 G Left Antecubital 03/20/22  2340  Antecubital  1   Peripheral IV 03/20/22 20 G Posterior;Right Hand 03/20/22  2340  Hand  1   Peripheral IV 03/20/22 20 G Left;Posterior Hand 03/20/22  2353  Hand  1            Intake/Output Last 24 hours  Intake/Output  Summary (Last 24 hours) at 03/21/2022 1244 Last data filed at 03/21/2022 0900 Gross per 24 hour  Intake 1347.89 ml  Output --  Net 1347.89 ml    Labs/Imaging Results for orders placed or performed during the hospital encounter of 03/20/22 (from the past 48 hour(s))  CBC with Differential/Platelet     Status: Abnormal   Collection Time: 03/20/22 11:30 PM  Result Value Ref Range   WBC 10.4 4.0 - 10.5 K/uL   RBC 2.03 (L) 3.87 - 5.11 MIL/uL   Hemoglobin 6.1 (LL) 12.0 - 15.0 g/dL    Comment: REPEATED TO VERIFY THIS CRITICAL RESULT HAS VERIFIED AND BEEN CALLED TO MANDY FOX RN BY BERTRAM TAYLOR ON 02 22 2024 AT 0018, AND HAS BEEN READ BACK.     HCT 19.3 (L) 36.0 - 46.0 %   MCV 95.1 80.0 - 100.0 fL   MCH 30.0 26.0 - 34.0 pg   MCHC 31.6 30.0 - 36.0 g/dL   RDW 20.0 (H) 11.5 - 15.5 %   Platelets 242 150 - 400 K/uL   nRBC 0.0 0.0 - 0.2 %   Neutrophils Relative % 59 %   Neutro Abs 6.1 1.7 - 7.7 K/uL   Lymphocytes Relative 25 %   Lymphs Abs 2.6 0.7 - 4.0 K/uL   Monocytes Relative 12 %   Monocytes Absolute 1.2 (H) 0.1 - 1.0 K/uL   Eosinophils Relative 2 %   Eosinophils Absolute 0.2 0.0 - 0.5 K/uL   Basophils Relative 1 %   Basophils Absolute 0.1 0.0 - 0.1 K/uL   Immature Granulocytes 1 %   Abs Immature Granulocytes 0.06 0.00 - 0.07 K/uL    Comment: Performed at [facility] [address], [place], [place] [id number]  Basic metabolic panel     Status: Abnormal   Collection Time: 03/20/22 11:30 PM  Result Value Ref Range   Sodium 132 (L) 135 - 145 mmol/L   Potassium 3.0 (L) 3.5 - 5.1 mmol/L   Chloride 104 98 - 111 mmol/L   CO2 18 (L) 22 - 32 mmol/L   Glucose, Bld 197 (H) 70 - 99 mg/dL    Comment: Glucose reference range applies only to samples taken after fasting for at least 8 hours.   BUN 15 6 - 20 mg/dL   Creatinine, Ser 0.97 0.44 - 1.00 mg/dL   Calcium 7.5 (L) 8.9 - 10.3 mg/dL   GFR, Estimated >60 >60 mL/min    Comment: (NOTE) Calculated using the CKD-EPI Creatinine  Equation (2021)    Anion gap 10 5 - 15    Comment: Performed at [facility] [address] [place], [place] [address]  Hepatic function panel     Status: Abnormal   Collection Time: 03/20/22 11:30 PM  Result Value Ref Range   Total Protein 4.4 (L) 6.5 - 8.1 g/dL   Albumin 2.4 (L) 3.5 - 5.0 g/dL   AST 43 (H) 15 - 41 U/L   ALT 35  0 - 44 U/L   Alkaline Phosphatase 74 38 - 126 U/L   Total Bilirubin 2.6 (H) 0.3 - 1.2 mg/dL   Bilirubin, Direct 0.9 (H) 0.0 - 0.2 mg/dL   Indirect Bilirubin 1.7 (H) 0.3 - 0.9 mg/dL    Comment: Performed at [facility] [address], [place], [place] [address]  Lipase, blood     Status: Abnormal   Collection Time: 03/20/22 11:30 PM  Result Value Ref Range   Lipase 84 (H) 11 - 51 U/L    Comment: Performed at [facility] [address], [place], [place] [address]  I-stat chem 8, ed     Status: Abnormal   Collection Time: 03/20/22 11:30 PM  Result Value Ref Range   Sodium 134 (L) 135 - 145 mmol/L   Potassium 3.1 (L) 3.5 - 5.1 mmol/L   Chloride 99 98 - 111 mmol/L   BUN 15 6 - 20 mg/dL   Creatinine, Ser 0.90 0.44 - 1.00 mg/dL   Glucose, Bld 186 (H) 70 - 99 mg/dL    Comment: Glucose reference range applies only to samples taken after fasting for at least 8 hours.   Calcium, Ion 1.10 (L) 1.15 - 1.40 mmol/L   TCO2 19 (L) 22 - 32 mmol/L   Hemoglobin 6.1 (LL) 12.0 - 15.0 g/dL   HCT 18.0 (L) 36.0 - 46.0 %   Comment NOTIFIED PHYSICIAN   Protime-INR     Status: Abnormal   Collection Time: 03/20/22 11:30 PM  Result Value Ref Range   Prothrombin Time 25.7 (H) 11.4 - 15.2 seconds   INR 2.4 (H) 0.8 - 1.2    Comment: (NOTE) INR goal varies based on device and disease states. Performed at [facility], [place] [address] [place], [place] [address]   Type and screen     Status: None (Preliminary result)   Collection Time: 03/20/22 11:30 PM  Result Value Ref Range   ABO/RH(D) O POS    Antibody Screen NEG    Sample Expiration  03/23/2022,2359    Unit Number [id number]    Blood Component Type RED CELLS,LR    Unit division 00    Status of Unit ISSUED,FINAL    Unit tag comment VERBAL ORDERS PER DR POLLINA    Transfusion Status OK TO TRANSFUSE    Crossmatch Result COMPATIBLE    Unit Number [id number]    Blood Component Type RED CELLS,LR    Unit division 00    Status of Unit ISSUED    Transfusion Status OK TO TRANSFUSE    Crossmatch Result Compatible    Unit Number [id number]    Blood Component Type RBC LR PHER2    Unit division 00    Status of Unit ALLOCATED    Transfusion Status OK TO TRANSFUSE    Crossmatch Result Compatible    Unit Number [id number]    Blood Component Type RED CELLS,LR    Unit division 00    Status of Unit ISSUED    Transfusion Status OK TO TRANSFUSE    Crossmatch Result Compatible    Unit Number [id number]    Blood Component Type RED CELLS,LR    Unit division 00    Status of Unit ALLOCATED    Transfusion Status OK TO TRANSFUSE    Crossmatch Result Compatible   Ammonia     Status: None   Collection Time: 03/20/22 11:30 PM  Result Value Ref Range   Ammonia 34 9 - 35 umol/L  Comment: Performed at [facility], [place] [address], [place], [place] [address]  Prepare RBC (crossmatch)     Status: None   Collection Time: 03/20/22 11:33 PM  Result Value Ref Range   Order Confirmation      ORDER PROCESSED BY BLOOD BANK Performed at [facility], [place] [address], [place], [place] [address]   Hemoglobin and hematocrit, blood     Status: Abnormal   Collection Time: 03/21/22  6:54 AM  Result Value Ref Range   Hemoglobin 9.4 (L) 12.0 - 15.0 g/dL    Comment: REPEATED TO VERIFY POST TRANSFUSION SPECIMEN    HCT 27.4 (L) 36.0 - 46.0 %    Comment: Performed at [facility] [address], [place], [place] [address]  Provider-confirm verbal Blood Bank order - RBC, Type & Screen; Order taken: 03/20/2022; 11:54 PM; Emergency Release 1 rbc unit transfused from ed  fridge     Status: None   Collection Time: 03/21/22  9:50 AM  Result Value Ref Range   Blood product order confirm      MD AUTHORIZATION REQUESTED Performed at [facility] [address], [place], [place] [address]    No results found.  Pending Labs Unresulted Labs (From admission, onward)     Start     Ordered   03/22/22 0500  Comprehensive metabolic panel  Tomorrow morning,   R        03/21/22 0724   03/22/22 0500  CBC  Tomorrow morning,   R        03/21/22 0724   03/21/22 0630  Hemoglobin and hematocrit, blood  Now then every 4 hours,   R (with STAT occurrences)     Comments: Per protocol/policy labs to be drawn 2 hours post last blood transfusion, transfusion completed at 04:30    03/21/22 0516            Vitals/Pain Today's Vitals   03/21/22 1000 03/21/22 1030 03/21/22 1140 03/21/22 1200  BP: 110/61 112/74 (!) 101/58 125/78  Pulse: 95 94 94 (!) 102  Resp: 15 17 19 15  $ Temp:      TempSrc:      SpO2: 99% 98% 97% 98%  Weight:      Height:      PainSc:        Isolation Precautions No active isolations  Medications Medications  octreotide (SANDOSTATIN) 2 mcg/mL load via infusion 50 mcg (50 mcg Intravenous Bolus from Bag 03/21/22 0017)    And  octreotide (SANDOSTATIN) 500 mcg in sodium chloride 0.9 % 250 mL (2 mcg/mL) infusion (50 mcg/hr Intravenous New Bag/Given 03/21/22 0900)  pantoprozole (PROTONIX) 80 mg /NS 100 mL infusion (8 mg/hr Intravenous New Bag/Given 03/21/22 0900)  pantoprazole (PROTONIX) injection 40 mg (has no administration in time range)  sodium chloride flush (NS) 0.9 % injection 3 mL (3 mLs Intravenous Given 03/21/22 0900)  acetaminophen (TYLENOL) tablet 650 mg (has no administration in time range)    Or  acetaminophen (TYLENOL) suppository 650 mg (has no administration in time range)  potassium chloride 10 mEq in 100 mL IVPB (10 mEq Intravenous New Bag/Given 03/21/22 1238)  cefTRIAXone (ROCEPHIN) 2 g in sodium chloride 0.9 % 100 mL IVPB  (0 g Intravenous Stopped 03/21/22 0013)  pantoprazole (PROTONIX) 80 mg /NS 100 mL IVPB (0 mg Intravenous Stopped 03/21/22 0011)  metoCLOPramide (REGLAN) injection 10 mg (10 mg Intravenous Given 03/20/22 2319)  0.9 %  sodium chloride infusion (Manually program  via Guardrails IV Fluids) (0 mLs Intravenous Stopped 03/21/22 0030)  0.9 %  sodium chloride infusion (Manually program via Guardrails IV Fluids) (0 mLs Intravenous Stopped 03/21/22 0229)    Mobility walks       R Recommendations: See Admitting Provider Note  Report given to:   Additional Notes:  Pt to EGD 1245

## 2022-03-21 NOTE — Transfer of Care (Addendum)
Immediate Anesthesia Transfer of Care Note  Patient: Erica Banks  Procedure(s) Performed: ESOPHAGOGASTRODUODENOSCOPY (EGD) WITH PROPOFOL ESOPHAGEAL BANDING  Patient Location: ICU  Anesthesia Type:General  Level of Consciousness: Patient remains intubated per anesthesia plan  Airway & Oxygen Therapy: Patient remains intubated per anesthesia plan  Post-op Assessment: Report given to RN and Post -op Vital signs reviewed and stable  Post vital signs: Reviewed and stable  Last Vitals:  Vitals Value Taken Time  BP 124/65   Temp 98   Pulse 98   Resp 16   SpO2 98     Last Pain:  Vitals:   03/21/22 1333  TempSrc: Temporal  PainSc: 0-No pain         Complications: No notable events documented.

## 2022-03-21 NOTE — Consult Note (Signed)
Consultation  Referring Provider:   Dr. Lissa Banks   Primary Care Physician:  Erica Burly, MD Primary Gastroenterologist: Dr. Holland Banks [facility] (unassigned here)        Reason for Consultation:   Decompensated cirrhosis with suspected variceal bleed         HPI:   Erica Banks is a 42 y.o. female with a past medical history of decompensated cirrhosis with hepatic encephalopathy and prior SBP as well as iron deficiency and history of PE/DVT and recurrent ascites, who presented to the ED today with a variceal bleed.    Recent admission as below 03/01/2022-03/18/2022 for hepatic encephalopathy.  Complicated course.  Please see those notes.  During that hospitalization her Lasix was increased to 40 twice daily and continued on Spironolactone 50 daily.    At time of presentation patient on a hemoglobin of 6, 3 units PRBCs ordered on Protonix every 12 and octreotide drip, blood pressure on presentation was in the 70s which normalized with IV fluid and hydration.    Today, the patient is seen with her husband by her bedside.  Tells me that she actually had an episode of hematemesis and bleeding the last time she was in the hospital a couple of weeks ago.  Since then she really had not noticed much.  Tells me that she did have an episode of vomiting last night and per EMS vomited about 10 cc of bright red blood.  Continues to have rectal bleeding as well but blames this on her known hemorrhoids.  Describes that recently her Lasix was increased to 40 mg twice a day in addition to her Spironolactone 50 mg and thinks this is dehydrating her and making her feel bad.  Her abdomen is distended but "it is always like this", apparently had a paracentesis about 3 weeks ago and typically gets 1 every 3 months.  She is not uncomfortable in her abdomen.  No new GI pain.  Remains on her Lactulose and Xifaxan but tells me she has not had a bowel movement yet this morning.  Associated symptoms include some dizziness  and weakness worse over the past 24 hours.    Patient is distressed today because she is supposed to have a phone call with [facility] in order to arrange for a live liver transplant from her son.  Tells me she is not getting resection and here and is nervous to miss the call.    Denies fever, chills or weight loss.  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 requiring vent support 03/01/2022 patient seen in clinic by Erica Altes, PA at that time described mental status changes brought in in a wheelchair, she was clearly encephalopathic and she was sent to the [facility] emergency department 02/05/2022 patient seen in clinic by Erica Banks at that time discussed history of alcoholic cirrhosis complicated by ascites and SBP (Streptococcus capitis) on daily Cipro for prophylaxis, who had been admitted in November 2023 with decompensation and left AMA to take care of her kids at home, at the time had a large bleeding but had engorged hemorrhoids and recalcitrant iron deficiency anemia requiring infusions had previously declined a nonselective beta-blocker given labile blood pressure, at that visit had developed abdominal distention and wanted a paracentesis she was on Furosemide and Spironolactone, previously seen Erica Banks as well; plan: Stat ultrasound paracentesis and labs MORE GI HISTORY IN CHART  Past Medical History:  Diagnosis Date   Anemia    Cirrhosis ([place])    DVT (deep venous thrombosis) (HCC)    Low iron    Neuropathy    Neuropathy    Panic attacks    Pulmonary embolus ([place]) 2019    Past Surgical History:  Procedure Laterality Date   ABDOMINAL HYSTERECTOMY     BIOPSY N/A 06/24/2013   Procedure: BIOPSY TERMINAL ILEUM;  Surgeon: Daneil Dolin, MD;  [facility];  Service: Endoscopy;  Laterality: N/A;   CHOLECYSTECTOMY     COLONOSCOPY N/A 06/24/2013    [id number] hemorrhoids/otherwise normal    COLONOSCOPY WITH PROPOFOL N/A 05/31/2021   Surgeon: Daneil Dolin, MD; ery large nonbleeding internal and external hemorrhoids, portal colopathy.  Recommended repeat colonoscopy in 10 years.   ESOPHAGOGASTRODUODENOSCOPY (EGD) WITH PROPOFOL N/A 03/29/2021   Surgeon: Eloise Harman, DO;  grade 1 esophageal varices, portal hypertensive gastropathy with friable tissue with spontaneous ooze, normal duodenum.   ESOPHAGOGASTRODUODENOSCOPY (EGD) WITH PROPOFOL N/A 05/31/2021   Surgeon: Daneil Dolin, MD;  grade 1-2 esophageal varices without bleeding stigmata, advanced appearing portal hypertensive gastropathy.   ESOPHAGOGASTRODUODENOSCOPY (EGD) WITH PROPOFOL N/A 12/22/2021   Procedure: ESOPHAGOGASTRODUODENOSCOPY (EGD) WITH PROPOFOL;  Surgeon: Harvel Quale, MD;  [facility];  Service: Gastroenterology;  Laterality: N/A;   IR PARACENTESIS  03/13/2022   nasal bone surgery     NASAL SINUS SURGERY     SINUS IRRIGATION     TONSILLECTOMY     TUBAL LIGATION      Family History  Problem Relation Age of Onset   Diabetes Mother    Hypertension Mother    COPD Mother    Cirrhosis Father        ETOH   Diabetes Sister    Hypertension Sister    Crohn's disease Maternal Grandmother    Cancer Other    Colon cancer Neg Hx    Autoimmune disease Neg Hx     Social History   Tobacco Use   Smoking status: Every Day    Packs/day: 0.50    Years: 12.00    Total pack years: 6.00    Types: Cigarettes   Smokeless tobacco: Never  Vaping Use   Vaping Use: Never used  Substance Use Topics   Alcohol use: Not Currently    Alcohol/week: 4.0 - 5.0 standard drinks of alcohol    Types: 4 - 5 Cans of beer per week    Comment: None since February 2023.   Drug use: Yes    Types: Marijuana    Comment: occasional    Prior to Admission medications   Medication Sig Start Date End Date Taking? Authorizing Provider  ciprofloxacin (CIPRO)  500 MG tablet Take 1 tablet (500 mg total) by mouth daily with breakfast. 03/19/22   Cherene Altes, MD  dronabinol (MARINOL) 2.5 MG capsule Take 1 capsule (2.5 mg total) by mouth 2 (two) times daily before lunch and supper. 03/18/22   Cherene Altes, MD  folic acid (FOLVITE) 1 MG tablet Take 1 tablet (1 mg total) by mouth daily. 03/19/22   Cherene Altes, MD  furosemide (LASIX) 40 MG tablet Take 1 tablet (40 mg total) by mouth 2 (two) times daily. 03/18/22   Cherene Altes, MD  lactulose (CHRONULAC) 10 GM/15ML solution Take 15 mLs (10 g total) by mouth 3 (three) times daily. 03/18/22 06/16/22  Cherene Altes, MD  Multiple Vitamin (MULTIVITAMIN WITH MINERALS) TABS tablet  Take 1 tablet by mouth daily. 03/18/22   Cherene Altes, MD  pantoprazole (PROTONIX) 40 MG tablet Take 1 tablet (40 mg total) by mouth 2 (two) times daily before a meal. 03/18/22   Cherene Altes, MD  rifaximin (XIFAXAN) 550 MG TABS tablet Take 1 tablet (550 mg total) by mouth 2 (two) times daily. 03/18/22 06/16/22  Cherene Altes, MD  spironolactone (ALDACTONE) 50 MG tablet Take 1 tablet (50 mg total) by mouth daily. 03/19/22   Cherene Altes, MD  thiamine (VITAMIN B1) 100 MG tablet Take 1 tablet (100 mg total) by mouth daily. 03/19/22   Cherene Altes, MD    Current Facility-Administered Medications  Medication Dose Route Frequency Provider Last Rate Last Admin   acetaminophen (TYLENOL) tablet 650 mg  650 mg Oral Q6H PRN Samella Parr, NP       Or   acetaminophen (TYLENOL) suppository 650 mg  650 mg Rectal Q6H PRN Samella Parr, NP       octreotide (SANDOSTATIN) 500 mcg in sodium chloride 0.9 % 250 mL (2 mcg/mL) infusion  50 mcg/hr Intravenous Continuous Orpah Greek, MD 25 mL/hr at 03/21/22 0016 50 mcg/hr at 03/21/22 0016   [START ON 03/24/2022] pantoprazole (PROTONIX) injection 40 mg  40 mg Intravenous Q12H Orpah Greek, MD       pantoprozole (PROTONIX) 80 mg /NS 100 mL  infusion  8 mg/hr Intravenous Continuous Orpah Greek, MD 10 mL/hr at 03/21/22 0012 8 mg/hr at 03/21/22 0012   sodium chloride flush (NS) 0.9 % injection 3 mL  3 mL Intravenous Q12H Samella Parr, NP       Current Outpatient Medications  Medication Sig Dispense Refill   ciprofloxacin (CIPRO) 500 MG tablet Take 1 tablet (500 mg total) by mouth daily with breakfast. 30 tablet 2   dronabinol (MARINOL) 2.5 MG capsule Take 1 capsule (2.5 mg total) by mouth 2 (two) times daily before lunch and supper. 20 capsule 0   folic acid (FOLVITE) 1 MG tablet Take 1 tablet (1 mg total) by mouth daily. 30 tablet 2   furosemide (LASIX) 40 MG tablet Take 1 tablet (40 mg total) by mouth 2 (two) times daily. 60 tablet 2   lactulose (CHRONULAC) 10 GM/15ML solution Take 15 mLs (10 g total) by mouth 3 (three) times daily. 1350 mL 2   Multiple Vitamin (MULTIVITAMIN WITH MINERALS) TABS tablet Take 1 tablet by mouth daily. 30 tablet 2   pantoprazole (PROTONIX) 40 MG tablet Take 1 tablet (40 mg total) by mouth 2 (two) times daily before a meal. 60 tablet 2   rifaximin (XIFAXAN) 550 MG TABS tablet Take 1 tablet (550 mg total) by mouth 2 (two) times daily. 60 tablet 2   spironolactone (ALDACTONE) 50 MG tablet Take 1 tablet (50 mg total) by mouth daily. 30 tablet 2   thiamine (VITAMIN B1) 100 MG tablet Take 1 tablet (100 mg total) by mouth daily. 30 tablet 2    Allergies as of 03/20/2022 - Review Complete 03/20/2022  Allergen Reaction Noted   Demerol Anaphylaxis 10/12/2010     Review of Systems:    Constitutional: No weight loss, fever or chills Skin: No rash  Cardiovascular: No chest pain Respiratory: No SOB Gastrointestinal: See HPI and otherwise negative Genitourinary: No dysuria  Neurological: No headache, dizziness or syncope Musculoskeletal: No new muscle or joint pain Hematologic: SEE HPI Psychiatric: No history of depression or anxiety    Physical Exam:  Vital signs  in last 24 hours: Temp:   [97.6 F (36.4 C)-98.7 F (37.1 C)] 98.6 F (37 C) (02/22 0500) Pulse Rate:  [82-103] 98 (02/22 0830) Resp:  [12-22] 18 (02/22 0830) BP: (71-129)/(38-73) 118/72 (02/22 0830) SpO2:  [95 %-100 %] 99 % (02/22 0830) Weight:  [68 kg] 68 kg (02/21 2253)   General:   Pleasant chronically ill appearing female appears to be in NAD, Well developed, Well nourished, alert and cooperative Head:  Normocephalic and atraumatic. Eyes:   PEERL, EOMI. No icterus. Conjunctiva pink. Ears:  Normal auditory acuity. Neck:  Supple Throat: Oral cavity and pharynx without inflammation, swelling or lesion. Lungs: Respirations even and unlabored. Lungs clear to auscultation bilaterally.   No wheezes, crackles, or rhonchi.  Heart: Normal S1, S2. No MRG. Regular rate and rhythm. No peripheral edema, cyanosis or pallor.  Abdomen:  Soft, marked distension, nontender. No rebound or guarding. Normal bowel sounds. No appreciable masses or hepatomegaly. Rectal:  Not performed.  Msk:  Symmetrical without gross deformities. Peripheral pulses intact.  Extremities:  Without edema, no deformity or joint abnormality.  Neurologic:  Alert and  oriented x4;  grossly normal neurologically.  Skin:   Dry and intact without significant lesions or rashes. Psychiatric: Demonstrates good judgement and reason without abnormal affect or behaviors.  LAB RESULTS: Recent Labs    03/20/22 2330 03/21/22 0654  WBC 10.4  --   HGB 6.1*  6.1* 9.4*  HCT 19.3*  18.0* 27.4*  PLT 242  --    BMET Recent Labs    03/20/22 2330  NA 132*  134*  K 3.0*  3.1*  CL 104  99  CO2 18*  GLUCOSE 197*  186*  BUN 15  15  CREATININE 0.97  0.90  CALCIUM 7.5*      Latest Ref Rng & Units 03/20/2022   11:30 PM 03/18/2022    3:46 AM 03/17/2022    3:13 AM  Hepatic Function  Total Protein 6.5 - 8.1 g/dL 4.4  5.8  5.6   Albumin 3.5 - 5.0 g/dL 2.4  3.2  3.2   AST 15 - 41 U/L 43  64  72   ALT 0 - 44 U/L 35  56  56   Alk Phosphatase 38 - 126 U/L  74  102  84   Total Bilirubin 0.3 - 1.2 mg/dL 2.6  3.5  3.9   Bilirubin, Direct 0.0 - 0.2 mg/dL 0.9        PT/INR Recent Labs    03/20/22 2330  LABPROT 25.7*  INR 2.4*     Impression / Plan:   Impression: 1.  Decompensated alcoholic cirrhosis: Recent admission 03/01/2022 with discharge 03/18/2022 for hepatic encephalopathy, also known history of esophageal varices and SBP currently on prophylaxis with ciprofloxacin, last EGD 12/22/2021 with grade 1-2 varices, MELD-NA 20 , started with increasing dizziness and weakness over the past 24 hours, 1 episode of vomiting about 10 cc of bright red blood per the paramedics, none since, hemoglobin dropped from 8.4 on 03/18/2022--> 6.1 on 03/20/2022--> 2 units PRBCs--> 9.4 today; consider variceal bleed versus other 2.  Symptomatic acute on chronic iron deficiency anemia: Patient has chronic anemia for which she follows with a hematologist with regular iron infusions/blood transfusions, at time of presentation acute drop in hemoglobin down 2 points over the past 48 hours and 1 episode of vomiting blood, known esophageal varices; concern for upper GI bleed  Plan: 1.  Patient scheduled for an EGD today with Dr. Tarri Glenn.  Did discuss risks, benefits, limitations and alternatives and the patient agrees to proceed. 2.  Keep patient n.p.o. until after time of procedure. 3.  Continue to monitor hemoglobin and transfusion as needed less than 7 4.  Continue home medications including Rifaximin, Lactulose, Aldactone and Lasix 5.  Agree with Octreotide and PPI infusion 6.  Agree with Rocephin which is already been given 7.  Please await further recommendations from Dr. Tarri Glenn after time of procedure  Thank you for your kind consultation, we will continue to follow.  Lavone Nian [facility]  03/21/2022, 8:39 AM

## 2022-03-21 NOTE — Progress Notes (Signed)
This is a 42 year old female with history of liver cirrhosis, hepatic encephalopathy, prior SBP, iron deficiency anemia, history of PE/DVT and recurrent ascites.  She came with variceal bleeding.  Her hemoglobin was 6.  She has had 3 units packed red blood cell ordered.  She is on Protonix every 12, octreotide drip.  Her blood pressure on presentation was in the 70s.  This is normalized with IV fluid hydration and transfusion.  Critical care saw patient and thought she was stable to go to the floor.  GI Dr. Gracelyn Nurse is aware.  Serial H&H ordered.

## 2022-03-21 NOTE — H&P (Signed)
History and Physical    Patient: Erica Banks [id number] DOB: 11/09/1980 DOA: 03/20/2022 DOS: the patient was seen and examined on 03/21/2022 PCP: Neale Burly, MD  Patient coming from: Home  Chief Complaint:  Chief Complaint  Patient presents with   Abdominal Pain   Nausea   Emesis   HPI: Erica Banks is a 42 y.o. female with medical history significant of chronic anemia, cirrhosis, iron deficiency, peripheral neuropathy, panic attacks and PE.  Scented to the ER after having nausea and vomiting and low-volume hematemesis.  She continued to have nausea and vomiting therefore presented to the ED.  She is on the transplant list and states she is expecting a call later today regarding her status.  Upon EMS evaluation of the patient prior to transport her blood pressure was in the 70s.  She was given 900 cc bolus by EMS.  After arrival to the ER patient's blood pressure remained in the low range between 75 and [id number] systolic.  Initial hemoglobin was low at 0.1 and 2 units of packed red blood cells were administered by the EDP.  Follow-up hemoglobin was 9.4.  GI has been consulted and EGD planned for today.  Reports she is hopeful to discharge later this afternoon due to expecting a call from the transplant team later this afternoon.  I stated that she could take the call on cell phone if they had her number and she reported she wanted to be at home because she gets an adequate self-service while in the hospital.  Coags are slightly elevated with a PT of 25.7, INR 2.4, platelets normal at 242,000.  On my exam with the patient she denied abdominal pain or any further emesis.  She adamantly denies any alcohol use.  Does admit to minimal tobacco use of 5 cigarettes/day and not daily.  Review of Systems: As mentioned in the history of present illness. All other systems reviewed and are negative. Past Medical History:  Diagnosis Date   Anemia    Cirrhosis ([place])    DVT (deep venous  thrombosis) (HCC)    Low iron    Neuropathy    Neuropathy    Panic attacks    Pulmonary embolus ([place]) 2019   Past Surgical History:  Procedure Laterality Date   ABDOMINAL HYSTERECTOMY     BIOPSY N/A 06/24/2013   Procedure: BIOPSY TERMINAL ILEUM;  Surgeon: Daneil Dolin, MD;  [facility];  Service: Endoscopy;  Laterality: N/A;   CHOLECYSTECTOMY     COLONOSCOPY N/A 06/24/2013   [id number] hemorrhoids/otherwise normal    COLONOSCOPY WITH PROPOFOL N/A 05/31/2021   Surgeon: Daneil Dolin, MD; ery large nonbleeding internal and external hemorrhoids, portal colopathy.  Recommended repeat colonoscopy in 10 years.   ESOPHAGOGASTRODUODENOSCOPY (EGD) WITH PROPOFOL N/A 03/29/2021   Surgeon: Eloise Harman, DO;  grade 1 esophageal varices, portal hypertensive gastropathy with friable tissue with spontaneous ooze, normal duodenum.   ESOPHAGOGASTRODUODENOSCOPY (EGD) WITH PROPOFOL N/A 05/31/2021   Surgeon: Daneil Dolin, MD;  grade 1-2 esophageal varices without bleeding stigmata, advanced appearing portal hypertensive gastropathy.   ESOPHAGOGASTRODUODENOSCOPY (EGD) WITH PROPOFOL N/A 12/22/2021   Procedure: ESOPHAGOGASTRODUODENOSCOPY (EGD) WITH PROPOFOL;  Surgeon: Harvel Quale, MD;  [facility];  Service: Gastroenterology;  Laterality: N/A;   IR PARACENTESIS  03/13/2022   nasal bone surgery     NASAL SINUS SURGERY     SINUS IRRIGATION     TONSILLECTOMY     TUBAL LIGATION  Social History:  reports that she has been smoking cigarettes. She has a 6.00 pack-year smoking history. She has never used smokeless tobacco. She reports that she does not currently use alcohol after a past usage of about 4.0 - 5.0 standard drinks of alcohol per week. She reports current drug use. Drug: Marijuana.  Allergies  Allergen Reactions   Demerol Anaphylaxis    Per patient cardiac arrest    Family History  Problem Relation Age of Onset   Diabetes Mother     Hypertension Mother    COPD Mother    Cirrhosis Father        ETOH   Diabetes Sister    Hypertension Sister    Crohn's disease Maternal Grandmother    Cancer Other    Colon cancer Neg Hx    Autoimmune disease Neg Hx     Prior to Admission medications   Medication Sig Start Date End Date Taking? Authorizing Provider  ciprofloxacin (CIPRO) 500 MG tablet Take 1 tablet (500 mg total) by mouth daily with breakfast. 03/19/22   Cherene Altes, MD  dronabinol (MARINOL) 2.5 MG capsule Take 1 capsule (2.5 mg total) by mouth 2 (two) times daily before lunch and supper. 03/18/22   Cherene Altes, MD  folic acid (FOLVITE) 1 MG tablet Take 1 tablet (1 mg total) by mouth daily. 03/19/22   Cherene Altes, MD  furosemide (LASIX) 40 MG tablet Take 1 tablet (40 mg total) by mouth 2 (two) times daily. 03/18/22   Cherene Altes, MD  lactulose (CHRONULAC) 10 GM/15ML solution Take 15 mLs (10 g total) by mouth 3 (three) times daily. 03/18/22 06/16/22  Cherene Altes, MD  Multiple Vitamin (MULTIVITAMIN WITH MINERALS) TABS tablet Take 1 tablet by mouth daily. 03/18/22   Cherene Altes, MD  pantoprazole (PROTONIX) 40 MG tablet Take 1 tablet (40 mg total) by mouth 2 (two) times daily before a meal. 03/18/22   Cherene Altes, MD  rifaximin (XIFAXAN) 550 MG TABS tablet Take 1 tablet (550 mg total) by mouth 2 (two) times daily. 03/18/22 06/16/22  Cherene Altes, MD  spironolactone (ALDACTONE) 50 MG tablet Take 1 tablet (50 mg total) by mouth daily. 03/19/22   Cherene Altes, MD  thiamine (VITAMIN B1) 100 MG tablet Take 1 tablet (100 mg total) by mouth daily. 03/19/22   Cherene Altes, MD    Physical Exam: Vitals:   03/21/22 0930 03/21/22 1000 03/21/22 1030 03/21/22 1140  BP: 107/71 110/61 112/74 (!) 101/58  Pulse: 93 95 94 94  Resp: 15 15 17 19  $ Temp:      TempSrc:      SpO2: 96% 99% 98% 97%  Weight:      Height:       Constitutional: NAD, calm, comfortable Respiratory: clear to  auscultation bilaterally, no wheezing, no crackles. Normal respiratory effort. No accessory muscle use.  Cardiovascular: Regular rate, sinus rhythm, no peripheral edema, no JVD. Abdomen: no tenderness, no masses palpated.+ hepatosplenomegaly. Bowel sounds positive,+ ascites Musculoskeletal: no clubbing / cyanosis. No joint deformity upper and lower extremities. Good ROM, no contractures. Normal muscle tone.  Skin: no rashes, lesions, ulcers. No induration-diffusely icteric including sclera Neurologic: CN 2-12 grossly intact. Sensation intact,  Strength 5/5 x all 4 extremities.  Psychiatric: Normal judgment and insight. Alert and oriented x 3. Normal mood.    Data Reviewed:  As per HPI  Assessment and Plan: Upper GI bleed suspected secondary to varices Appreciate assistance of GI  team Anticipate EGD today with possible banding of varices No further hematemesis since arrival Continue octreotide and Protonix infusion  Alcoholic cirrhosis with recurrent ascites Patient adamantly states she is no longer using alcoholic beverages.  She reports she is on the transplant list at [facility]. Patient reports she undergoes paracentesis about every 2 to 3 months and states she has had 1 episode in the past 12 months of SBP Low coags are mildly elevated platelets are normal Chronic hyponatremia secondary to cirrhosis Ammonia level 34 Total protein 4.4 AST 43 Resume Xifaxan and Chronulac post EGD Patient complains of being over diuresed and chronically dehydrated on current diuretic regimen  Acute blood loss anemia Hemoglobin has increased from 6.1-9.6 after 2 units PRBCs If patient not discharged today will need to repeat CBC in a.m. to ensure hemoglobin not trending down  Hypokalemia Replace IV and hopefully can have oral replacement after EGD completed    Advance Care Planning:   Code Status: Full Code   DVT prophylaxis: SCDs given active bleeding  Consults: Gastroenterology  Family  Communication: Significant other at bedside  Severity of Illness: The appropriate patient status for this patient is INPATIENT. Inpatient status is judged to be reasonable and necessary in order to provide the required intensity of service to ensure the patient's safety. The patient's presenting symptoms, physical exam findings, and initial radiographic and laboratory data in the context of their chronic comorbidities is felt to place them at high risk for further clinical deterioration. Furthermore, it is not anticipated that the patient will be medically stable for discharge from the hospital within 2 midnights of admission.   * I certify that at the point of admission it is my clinical judgment that the patient will require inpatient hospital care spanning beyond 2 midnights from the point of admission due to high intensity of service, high risk for further deterioration and high frequency of surveillance required.*  Author: Erin Hearing, NP 03/21/2022 12:09 PM  For on call review [email or web address].

## 2022-03-21 NOTE — Progress Notes (Signed)
Pt presented to endoscopy today for EGD with MD Beavers for anemia and upper GI bleed. See MD Beavers's op note for EGD findings. Due to bleeding, pt transferred to [facility] ICU. Report given to Myriam Jacobson, RN in [facility] ICU.  Debarah Crape, RN 03/21/22 3:34 PM

## 2022-03-21 NOTE — Anesthesia Procedure Notes (Signed)
Procedure Name: Intubation Date/Time: 03/21/2022 2:15 PM  Performed by: Minerva Ends, CRNAPre-anesthesia Checklist: Patient identified, Emergency Drugs available, Suction available and Patient being monitored Patient Re-evaluated:Patient Re-evaluated prior to induction Oxygen Delivery Method: Circle system utilized Preoxygenation: Pre-oxygenation with 100% oxygen Induction Type: IV induction, Cricoid Pressure applied and Rapid sequence Laryngoscope Size: Mac and 3 Grade View: Grade I Tube type: Oral Tube size: 7.0 mm Number of attempts: 1 Airway Equipment and Method: Stylet and Oral airway Placement Confirmation: ETT inserted through vocal cords under direct vision, positive ETCO2 and breath sounds checked- equal and bilateral Secured at: 21 cm Tube secured with: Tape Dental Injury: Teeth and Oropharynx as per pre-operative assessment

## 2022-03-21 NOTE — Progress Notes (Signed)
ETT pulled back to 20cm at the lip per order.

## 2022-03-21 NOTE — Consult Note (Signed)
NAME:  Erica Banks, MRN:  [id number], DOB:  09-18-1980, LOS: 0 ADMISSION DATE:  03/20/2022, CONSULTATION DATE:  03/21/22 REFERRING MD:  Modena Nunnery, CHIEF COMPLAINT:  hematemesis   History of Present Illness:  42 year old woman w/ EtOH cirrhosis, ascites, hepatic encephalopathy presenting with N/V hematemesis.  Taken for urgent EGD found to have red whale sign requiring banding, successful.  Due to degree of bleeding and hx of severe encephalopathy, left of vent.  PCCM to take over management.  Pertinent  Medical History  EtOH cirrhosis with ascites Hx DVT/PE  Significant Hospital Events: Including procedures, antibiotic start and stop dates in addition to other pertinent events   2/22 admit, EGD, variceal banding by Dr. Tarri Glenn  Interim History / Subjective:  Consulted  Objective   Blood pressure 126/75, pulse 95, temperature 98.4 F (36.9 C), temperature source Temporal, resp. rate 18, height 5' 6"$  (1.676 m), weight 68 kg, SpO2 97 %.        Intake/Output Summary (Last 24 hours) at 03/21/2022 1459 Last data filed at 03/21/2022 1456 Gross per 24 hour  Intake 1662.89 ml  Output --  Net 1662.89 ml   Filed Weights   03/20/22 2253  Weight: 68 kg    Examination: General: ill appearing woman on vent HENT: bloody secretions around mouth, ETT looks clear Lungs: clear, passive on vent Cardiovascular: tachy, +holosystolic murmur over apex Abdomen: protuberant, shifting dullness Extremities: ext warm Neuro: currently heavily sedated Skin: no rashes  Resolved Hospital Problem list   N/A  Assessment & Plan:  ABLA 2/2 esophageal bleeding s/p banding Need for mechanical ventilation for airway protection EtOH cirrhosis with ascites- MELD 30 Hx of DVT/PE not on home AC Hx hepatic encephalopathy  - Vent bundle - RASS to -1 to 0 w/ prop and PRN fent - Octreotide, ppi gtt - Trend CBC, liver indices - Ceftriaxone for SBP ppx - IR consult if has any recurrence for TIPS vs.  Retrograde variceal balloon ablation - Lactulose/rifaxamin when able  Best Practice (right click and "Reselect all SmartList Selections" daily)   Diet/type: NPO DVT prophylaxis: SCD GI prophylaxis: PPI Lines: N/A Foley:  N/A Code Status:  full code Last date of multidisciplinary goals of care discussion [pending]  Labs   CBC: Recent Labs  Lab 03/15/22 0743 03/15/22 2015 03/17/22 0313 03/18/22 0346 03/20/22 2330 03/21/22 0654 03/21/22 1224  WBC 8.9 9.9 7.9 7.4 10.4  --   --   NEUTROABS  --   --   --   --  6.1  --   --   HGB 7.8* 8.2* 7.8* 8.4* 6.1*  6.1* 9.4* 9.5*  HCT 25.1* 26.3* 24.3* 25.9* 19.3*  18.0* 27.4* 27.6*  MCV 96.2 96.3 93.5 92.2 95.1  --   --   PLT 102* 117* 131* 161 242  --   --     Basic Metabolic Panel: Recent Labs  Lab 03/15/22 0743 03/17/22 0313 03/18/22 0346 03/20/22 2330  NA 139 137 134* 132*  134*  K 4.4 4.0 3.7 3.0*  3.1*  CL 111 108 104 104  99  CO2 19* 18* 19* 18*  GLUCOSE 298* 125* 142* 197*  186*  BUN 31* 27* 18 15  15  $ CREATININE 0.82 0.95 0.90 0.97  0.90  CALCIUM 8.4* 8.7* 8.6* 7.5*  MG 1.9 1.5*  --   --   PHOS 3.1 4.4  --   --    GFR: Estimated Creatinine Clearance: 72.2 mL/min (by C-G formula based on SCr  of 0.97 mg/dL). Recent Labs  Lab 03/15/22 2015 03/17/22 0313 03/18/22 0346 03/20/22 2330  WBC 9.9 7.9 7.4 10.4    Liver Function Tests: Recent Labs  Lab 03/17/22 0313 03/18/22 0346 03/20/22 2330  AST 72* 64* 43*  ALT 56* 56* 35  ALKPHOS 84 102 74  BILITOT 3.9* 3.5* 2.6*  PROT 5.6* 5.8* 4.4*  ALBUMIN 3.2* 3.2* 2.4*   Recent Labs  Lab 03/20/22 2330  LIPASE 84*   Recent Labs  Lab 03/15/22 0743 03/20/22 2330  AMMONIA 84* 34    ABG    Component Value Date/Time   PHART 7.38 03/01/2022 1656   PCO2ART 30 (L) 03/01/2022 1656   PO2ART 160 (H) 03/01/2022 1656   HCO3 18.1 (L) 03/01/2022 1656   TCO2 19 (L) 03/20/2022 2330   ACIDBASEDEF 6.6 (H) 03/01/2022 1656   O2SAT 100 03/01/2022 1656      Coagulation Profile: Recent Labs  Lab 03/20/22 2330  INR 2.4*    Cardiac Enzymes: No results for input(s): "CKTOTAL", "CKMB", "CKMBINDEX", "TROPONINI" in the last 168 hours.  HbA1C: No results found for: "HGBA1C"  CBG: Recent Labs  Lab 03/17/22 1118 03/17/22 1603 03/17/22 2124 03/18/22 0623 03/18/22 1130  GLUCAP 116* 166* 185* 122* 133*    Review of Systems:   Intubated/sedated  Past Medical History:  She,  has a past medical history of Anemia, Cirrhosis ([place]), DVT (deep venous thrombosis) ([place]), Low iron, Neuropathy, Neuropathy, Panic attacks, and Pulmonary embolus ([place]) (2019).   Surgical History:   Past Surgical History:  Procedure Laterality Date   ABDOMINAL HYSTERECTOMY     BIOPSY N/A 06/24/2013   Procedure: BIOPSY TERMINAL ILEUM;  Surgeon: Daneil Dolin, MD;  Location: [facility];  Service: Endoscopy;  Laterality: N/A;   CHOLECYSTECTOMY     COLONOSCOPY N/A 06/24/2013   [id number] hemorrhoids/otherwise normal    COLONOSCOPY WITH PROPOFOL N/A 05/31/2021   Surgeon: Daneil Dolin, MD; ery large nonbleeding internal and external hemorrhoids, portal colopathy.  Recommended repeat colonoscopy in 10 years.   ESOPHAGOGASTRODUODENOSCOPY (EGD) WITH PROPOFOL N/A 03/29/2021   Surgeon: Eloise Harman, DO;  grade 1 esophageal varices, portal hypertensive gastropathy with friable tissue with spontaneous ooze, normal duodenum.   ESOPHAGOGASTRODUODENOSCOPY (EGD) WITH PROPOFOL N/A 05/31/2021   Surgeon: Daneil Dolin, MD;  grade 1-2 esophageal varices without bleeding stigmata, advanced appearing portal hypertensive gastropathy.   ESOPHAGOGASTRODUODENOSCOPY (EGD) WITH PROPOFOL N/A 12/22/2021   Procedure: ESOPHAGOGASTRODUODENOSCOPY (EGD) WITH PROPOFOL;  Surgeon: Harvel Quale, MD;  Location: [facility];  Service: Gastroenterology;  Laterality: N/A;   IR PARACENTESIS  03/13/2022   nasal bone surgery     NASAL SINUS SURGERY     SINUS IRRIGATION      TONSILLECTOMY     TUBAL LIGATION       Social History:   reports that she has been smoking cigarettes. She has a 6.00 pack-year smoking history. She has never used smokeless tobacco. She reports that she does not currently use alcohol after a past usage of about 4.0 - 5.0 standard drinks of alcohol per week. She reports current drug use. Drug: Marijuana.   Family History:  Her family history includes COPD in her mother; Cancer in an other family member; Cirrhosis in her father; Crohn's disease in her maternal grandmother; Diabetes in her mother and sister; Hypertension in her mother and sister. There is no history of Colon cancer or Autoimmune disease.   Allergies Allergies  Allergen Reactions   Demerol Anaphylaxis  Per patient cardiac arrest     Home Medications  Prior to Admission medications   Medication Sig Start Date End Date Taking? Authorizing Provider  acetaminophen (TYLENOL) 500 MG tablet Take 1,000 mg by mouth daily as needed for moderate pain.   Yes [provider]  ciprofloxacin (CIPRO) 500 MG tablet Take 1 tablet (500 mg total) by mouth daily with breakfast. 03/19/22  Yes Cherene Altes, MD  dronabinol (MARINOL) 2.5 MG capsule Take 1 capsule (2.5 mg total) by mouth 2 (two) times daily before lunch and supper. Patient taking differently: Take 2.5 mg by mouth daily. 03/18/22  Yes Cherene Altes, MD  folic acid (FOLVITE) 1 MG tablet Take 1 tablet (1 mg total) by mouth daily. 03/19/22  Yes Cherene Altes, MD  furosemide (LASIX) 40 MG tablet Take 1 tablet (40 mg total) by mouth 2 (two) times daily. 03/18/22  Yes Cherene Altes, MD  lactulose (CHRONULAC) 10 GM/15ML solution Take 15 mLs (10 g total) by mouth 3 (three) times daily. 03/18/22 06/16/22 Yes Cherene Altes, MD  Multiple Vitamin (MULTIVITAMIN WITH MINERALS) TABS tablet Take 1 tablet by mouth daily. 03/18/22  Yes Cherene Altes, MD  pantoprazole (PROTONIX) 40 MG tablet Take 1 tablet (40 mg total)  by mouth 2 (two) times daily before a meal. 03/18/22  Yes Cherene Altes, MD  rifaximin (XIFAXAN) 550 MG TABS tablet Take 1 tablet (550 mg total) by mouth 2 (two) times daily. Patient taking differently: Take 550 mg by mouth daily. 03/18/22 06/16/22 Yes Cherene Altes, MD  spironolactone (ALDACTONE) 50 MG tablet Take 1 tablet (50 mg total) by mouth daily. 03/19/22  Yes Cherene Altes, MD  thiamine (VITAMIN B1) 100 MG tablet Take 1 tablet (100 mg total) by mouth daily. 03/19/22  Yes Cherene Altes, MD  gabapentin (NEURONTIN) 300 MG capsule Take 300 mg by mouth 3 (three) times daily. Patient not taking: Reported on 03/21/2022    [provider]     Critical care time: 34 mins independent of procedures

## 2022-03-21 NOTE — Anesthesia Preprocedure Evaluation (Signed)
Anesthesia Evaluation  Patient identified by MRN, date of birth, ID band Patient awake    Reviewed: Allergy & Precautions, NPO status , Patient's Chart, lab work & pertinent test results  Airway Mallampati: II  TM Distance: >3 FB Neck ROM: Full    Dental  (+) Chipped, Dental Advisory Given,    Pulmonary Current Smoker and Patient abstained from smoking.   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 Coagulopathy   Anesthesia Other Findings   Reproductive/Obstetrics                              Anesthesia Physical Anesthesia Plan  ASA: 4  Anesthesia Plan: General   Post-op Pain Management:    Induction: Intravenous, Rapid sequence and Cricoid pressure planned  PONV Risk Score and Plan: 3 and Treatment may vary due to age or medical condition and Ondansetron  Airway Management Planned: Oral ETT  Additional Equipment: None  Intra-op Plan:   Post-operative Plan: Extubation in OR  Informed Consent: I have reviewed the patients History and Physical, chart, labs and discussed the procedure including the risks, benefits and alternatives for the proposed anesthesia with the patient or authorized representative who has indicated his/her understanding and acceptance.     Dental advisory given  Plan Discussed with: CRNA and  Anesthesiologist  Anesthesia Plan Comments:          Anesthesia Quick Evaluation

## 2022-03-21 NOTE — ED Notes (Signed)
Emergency Blood started at 999 ml/hr [id number]

## 2022-03-21 NOTE — ED Notes (Signed)
Emergency Blood STOP at this time, no s/s of transfusion reaction  [id number]

## 2022-03-21 NOTE — Op Note (Signed)
[facility] Patient Name: Erica Banks Procedure Date : 03/21/2022 MRN: [id number] Attending MD: Thornton Park MD, MD, [id number] Date of Birth: 08/17/1980 CSN: [id number] Age: 42 Admit Type: Inpatient Procedure:                Upper GI endoscopy Indications:              Hematemesis Providers:                Thornton Park MD, MD, Dulcy Fanny, Benetta Spar, Technician Referring MD:              Medicines:                General Anesthesia Complications:            No immediate complications. Estimated Blood Loss:     Estimated blood loss was minimal. Procedure:                Pre-Anesthesia Assessment:                           - Prior to the procedure, a History and Physical                            was performed, and patient medications and                            allergies were reviewed. The patient's tolerance of                            previous anesthesia was also reviewed. The risks                            and benefits of the procedure and the sedation                            options and risks were discussed with the patient.                            All questions were answered, and informed consent                            was obtained. Prior Anticoagulants: The patient has                            taken no anticoagulant or antiplatelet agents. ASA                            Grade Assessment: IV - A patient with severe                            systemic disease that is a constant threat to life.  After reviewing the risks and benefits, the patient                            was deemed in satisfactory condition to undergo the                            procedure.                           After obtaining informed consent, the endoscope was                            passed under direct vision. Throughout the                            procedure, the patient's blood pressure,  pulse, and                            oxygen saturations were monitored continuously. The                            [id number]) Olympus endoscope was introduced                            through the mouth, and advanced to the third part                            of duodenum. The upper GI endoscopy was performed                            with moderate difficulty due to excessive bleeding.                            Successful completion of the procedure was aided by                            changing the patient's position. The patient                            tolerated the procedure well. Scope In: Scope Out: Findings:      Three cords of grade II varices were found in the middle third of the       esophagus and in the lower third of the esophagus. One white nipple sign       present in the mid esophagus seen on initial intubation of the       esophagus, but this varix started bleeding during the procedure. Five       bands were successfully placed with five attemps, resulting in deflation       of varices. Bleeding had stopped at the end of the procedure.      Portal hypertensive gastropathy found in the gastric fundus and in the       gastric body. No other mucosal abnormalities seen. No blood seen in the       stomach in the stomach.  The examined duodenum was normal.      The cardia and gastric fundus were normal on retroflexion. Impression:               - Actively bleeding esophageal varix s/p banding x                            5. Bleeding stopped with band ligation.                           - Portal hypertensive gastropathy. No gastric                            varices.                           - Normal other source for anemia identified on this                            procedure. Recommendation:           - Admit patient to ICU to allow for continued                            intubation for airway protection.                           - NPO.                            - Continue present medications including continuous                            infusion octreotide and pantoprazole.                           - Complete 5 days of empiric antibiotics.                           - Serial hgb/hct with transfusion as indicated.                           - Repeat EGD for banding in 3-4 weeks.                           - Non-selective beta-blocker after completing                            octreotide infusion.                           - Consider TIPS with rebleeding, although she is                            high risk for hepatic encephalopathy.                           I updated her husband by phone. Procedure  Code(s):        --- Professional ---                           [phone], Esophagogastroduodenoscopy, flexible,                            transoral; with band ligation of esophageal/gastric                            varices Diagnosis Code(s):        --- Professional ---                           I85.00, Esophageal varices without bleeding                           K76.6, Portal hypertension                           K31.89, Other diseases of stomach and duodenum                           K92.0, Hematemesis CPT copyright 2022 [employer]. All rights reserved. The codes documented in this report are preliminary and upon coder review may  be revised to meet current compliance requirements. Thornton Park MD, MD 03/21/2022 3:43:08 PM This report has been signed electronically. Number of Addenda: 0

## 2022-03-22 ENCOUNTER — Inpatient Hospital Stay (HOSPITAL_COMMUNITY): Payer: Medicaid Other

## 2022-03-22 LAB — HEMOGLOBIN AND HEMATOCRIT, BLOOD
HCT: 28.5 % — ABNORMAL LOW (ref 36.0–46.0)
HCT: 29.4 % — ABNORMAL LOW (ref 36.0–46.0)
Hemoglobin: 9.5 g/dL — ABNORMAL LOW (ref 12.0–15.0)
Hemoglobin: 10.3 g/dL — ABNORMAL LOW (ref 12.0–15.0)

## 2022-03-22 LAB — CBC
HCT: 29 % — ABNORMAL LOW (ref 36.0–46.0)
HCT: 30.1 % — ABNORMAL LOW (ref 36.0–46.0)
Hemoglobin: 10.2 g/dL — ABNORMAL LOW (ref 12.0–15.0)
Hemoglobin: 9.9 g/dL — ABNORMAL LOW (ref 12.0–15.0)
MCH: 29 pg (ref 26.0–34.0)
MCH: 30.9 pg (ref 26.0–34.0)
MCHC: 32.9 g/dL (ref 30.0–36.0)
MCHC: 35.2 g/dL (ref 30.0–36.0)
MCV: 87.9 fL (ref 80.0–100.0)
MCV: 88.3 fL (ref 80.0–100.0)
Platelets: 189 10*3/uL (ref 150–400)
Platelets: 241 10*3/uL (ref 150–400)
RBC: 3.3 MIL/uL — ABNORMAL LOW (ref 3.87–5.11)
RBC: 3.41 MIL/uL — ABNORMAL LOW (ref 3.87–5.11)
RDW: 18.8 % — ABNORMAL HIGH (ref 11.5–15.5)
RDW: 19.4 % — ABNORMAL HIGH (ref 11.5–15.5)
WBC: 13.8 10*3/uL — ABNORMAL HIGH (ref 4.0–10.5)
WBC: 14.8 10*3/uL — ABNORMAL HIGH (ref 4.0–10.5)
nRBC: 0 % (ref 0.0–0.2)
nRBC: 0 % (ref 0.0–0.2)

## 2022-03-22 LAB — GLUCOSE, CAPILLARY
Glucose-Capillary: 107 mg/dL — ABNORMAL HIGH (ref 70–99)
Glucose-Capillary: 108 mg/dL — ABNORMAL HIGH (ref 70–99)
Glucose-Capillary: 109 mg/dL — ABNORMAL HIGH (ref 70–99)
Glucose-Capillary: 192 mg/dL — ABNORMAL HIGH (ref 70–99)
Glucose-Capillary: 146 mg/dL — ABNORMAL HIGH (ref 70–99)
Glucose-Capillary: 143 mg/dL — ABNORMAL HIGH (ref 70–99)

## 2022-03-22 LAB — COMPREHENSIVE METABOLIC PANEL
ALT: 38 U/L (ref 0–44)
AST: 57 U/L — ABNORMAL HIGH (ref 15–41)
Albumin: 2.7 g/dL — ABNORMAL LOW (ref 3.5–5.0)
Alkaline Phosphatase: 83 U/L (ref 38–126)
Anion gap: 10 (ref 5–15)
BUN: 31 mg/dL — ABNORMAL HIGH (ref 6–20)
CO2: 18 mmol/L — ABNORMAL LOW (ref 22–32)
Calcium: 8.1 mg/dL — ABNORMAL LOW (ref 8.9–10.3)
Chloride: 104 mmol/L (ref 98–111)
Creatinine, Ser: 1.08 mg/dL — ABNORMAL HIGH (ref 0.44–1.00)
GFR, Estimated: >60 mL/min (ref >60)
Glucose, Bld: 111 mg/dL — ABNORMAL HIGH (ref 70–99)
Potassium: 4.7 mmol/L (ref 3.5–5.1)
Sodium: 132 mmol/L — ABNORMAL LOW (ref 135–145)
Total Bilirubin: 2.6 mg/dL — ABNORMAL HIGH (ref 0.3–1.2)
Total Protein: 4.9 g/dL — ABNORMAL LOW (ref 6.5–8.1)

## 2022-03-22 LAB — AMMONIA: Ammonia: 175 umol/L — ABNORMAL HIGH (ref 9–35)

## 2022-03-22 LAB — TRIGLYCERIDES: Triglycerides: 659 mg/dL — ABNORMAL HIGH (ref <150)

## 2022-03-22 LAB — MAGNESIUM: Magnesium: 2.5 mg/dL — ABNORMAL HIGH (ref 1.7–2.4)

## 2022-03-22 LAB — PROTIME-INR
INR: 1.9 — ABNORMAL HIGH (ref 0.8–1.2)
Prothrombin Time: 21.3 seconds — ABNORMAL HIGH (ref 11.4–15.2)

## 2022-03-22 LAB — PHOSPHORUS: Phosphorus: 5.2 mg/dL — ABNORMAL HIGH (ref 2.5–4.6)

## 2022-03-22 MED ORDER — INSULIN ASPART 100 UNIT/ML IJ SOLN: 0.0000 [IU] | Freq: Every day | INTRAMUSCULAR | Status: DC

## 2022-03-22 MED ORDER — PANTOPRAZOLE SODIUM 40 MG IV SOLR
40.0000 mg | Freq: Two times a day (BID) | INTRAVENOUS | Status: DC
  Administered 2022-03-22 – 2022-03-26 (×9): 40 mg via INTRAVENOUS
  Filled 2022-03-22 (×9): qty 10

## 2022-03-22 MED ORDER — ORAL CARE MOUTH RINSE: 15.0000 mL | OROMUCOSAL | Status: DC | PRN

## 2022-03-22 MED ORDER — LACTULOSE 10 GM/15ML PO SOLN
10.0000 g | Freq: Three times a day (TID) | ORAL | Status: DC
  Administered 2022-03-22 – 2022-03-26 (×12): 10 g via ORAL
  Filled 2022-03-22 (×12): qty 15

## 2022-03-22 MED ORDER — RIFAXIMIN 550 MG PO TABS
550.0000 mg | ORAL_TABLET | Freq: Every day | ORAL | Status: DC
  Administered 2022-03-22 – 2022-03-26 (×5): 550 mg via ORAL
  Filled 2022-03-22 (×5): qty 1

## 2022-03-22 MED ORDER — INSULIN ASPART 100 UNIT/ML IJ SOLN
0.0000 [IU] | Freq: Three times a day (TID) | INTRAMUSCULAR | Status: DC
  Administered 2022-03-24: 1 [IU] via SUBCUTANEOUS

## 2022-03-22 MED ORDER — INSULIN ASPART 100 UNIT/ML IJ SOLN: 0.0000 [IU] | Freq: Three times a day (TID) | INTRAMUSCULAR | Status: DC

## 2022-03-22 MED ORDER — INSULIN ASPART 100 UNIT/ML IJ SOLN
0.0000 [IU] | Freq: Three times a day (TID) | INTRAMUSCULAR | Status: DC
  Administered 2022-03-22: 1 [IU] via SUBCUTANEOUS

## 2022-03-22 NOTE — Progress Notes (Addendum)
[facility] Progress Note  CC:  UGI bleed  Assessment / Plan: 42 year old female history of decompensated alcoholic cirrhosis with ascites and history of SBP, coagulopathy, recent hospitalization for hepatic encephalopathy and HRS admitted with symptomatic anemia. She also has a history of chronic iron deficiency anemia requiring both IV iron and PRBCs prior to this hospitalization and a history of DVT/PE not on anticoagulation. She was found to have an actively bleeding esophageal varix on EGD yesterday with bleeding successful controlled with band ligation.  She remained intubated overnight for airway protection.  Hemoglobin has remained stabile following her procedure.   MELD 21   Recommendations: - Continue octreotide continuous infusion - PPI IV BID - Complete 5 days of empiric antibiotics - Continue serial hgb/hct with transfusion as indicated - Clear liquid diet if extubation is successful today - Resume Xifaxan and lactulose on extubation as she is at high risk for recurrent HE - Start non-selective beta-blocker after completing octreotide - Repeat EGD in 3-4 weeks for additional banding - Formal alcohol counseling on discharge - Outpatient transplant evaluation - patient has contacted [facility] already  Discussed with the ICU team on their morning rounds. I updated her husband by phone.   Subjective: Intubated. Alert. Nodding head. No obvious new symptoms. No family present at the time of my evaluation.   Objective:  Vital signs in last 24 hours: Temp:  [97.5 F (36.4 C)-98.4 F (36.9 C)] 98.2 F (36.8 C) (02/23 0746) Pulse Rate:  [75-106] 80 (02/23 0800) Resp:  [11-25] 20 (02/23 0800) BP: (90-136)/(57-85) 117/72 (02/23 0800) SpO2:  [97 %-100 %] 100 % (02/23 0837) FiO2 (%):  [40 %-100 %] 40 % (02/23 0837) Weight:  [68 kg-69.4 kg] 69.4 kg (02/23 0453) Last BM Date : 03/22/22 General:   Alert, ET tube present Heart:  Less tachycardic today; no  murmurs Pulm: Clear anteriorly except for some coarse upper aiway sounds; no wheezing Abdomen:  Protuberent but soft. Nontender. Normal bowel sounds. No rebound or guarding. Reducible umbilical hernia.  Extremities:  Without edema. Neurologic:  Alert. Unable to assess for asterixis.  Psych:  Alert.   Lab Results: Recent Labs    03/21/22 1559 03/21/22 1720 03/22/22 0029 03/22/22 0339 03/22/22 0707 03/22/22 0719  WBC 18.5*  --  14.8*  --   --  13.8*  HGB 10.6*  10.7*   < > 9.9* 9.5* 10.3* 10.2*  HCT 32.2*  31.3*   < > 30.1* 28.5* 29.4* 29.0*  PLT 293  --  189  --   --  241   < > = values in this interval not displayed.   BMET Recent Labs    03/20/22 2330 03/21/22 1540 03/21/22 1559 03/21/22 1720 03/21/22 1913  NA 132*  134* 134* 135 134*  --   K 3.0*  3.1* 4.5 4.5 4.5 4.4  CL 104  99  --  105  --   --   CO2 18*  --  21*  --   --   GLUCOSE 197*  186*  --  145*  --   --   BUN 15  15  --  20  --   --   CREATININE 0.97  0.90  --  0.98  --   --   CALCIUM 7.5*  --  7.9*  --   --    LFT Recent Labs    03/20/22 2330  PROT 4.4*  ALBUMIN 2.4*  AST 43*  ALT 35  ALKPHOS 74  BILITOT 2.6*  BILIDIR 0.9*  IBILI 1.7*   PT/INR Recent Labs    03/20/22 2330 03/22/22 0719  LABPROT 25.7* 21.3*  INR 2.4* 1.9*   Hepatitis Panel No results for input(s): "HEPBSAG", "HCVAB", "HEPAIGM", "HEPBIGM" in the last 72 hours.  DG Chest Port 1 View  Result Date: 03/22/2022 CLINICAL DATA:  [id number] with ventilator dependent respiratory failure. EXAM: PORTABLE CHEST 1 VIEW COMPARISON:  Portable chest yesterday at 3:24 p.m. FINDINGS: 4:50 a.m. ETT interval pullback to 2 cm from the carina, was previously only a few mm over it. The cardiomediastinal silhouette and vascular markings are normal. The lungs are expiratory obscuring the bases, with the visualized lungs clear. No acute osseous findings. Stable overall aeration. IMPRESSION: 1. ETT interval pullback to 2 cm from the carina. 2.  Expiratory chest with the visualized lungs clear. The lung bases are not evaluated. Electronically Signed   By: Telford Nab M.D.   On: 03/22/2022 06:14   Portable Chest x-ray  Result Date: 03/21/2022 CLINICAL DATA:  Endotracheal tube placement EXAM: PORTABLE CHEST 1 VIEW COMPARISON:  03/10/2022 FINDINGS: An endotracheal tube has been placed with tip measuring about 9 mm above the carina. Previous enteric tube is been removed. Shallow inspiration with linear atelectasis in the lung bases. Heart size and pulmonary vascularity are normal for technique. No pleural effusions. No pneumothorax. IMPRESSION: Endotracheal tube placed with tip measuring about 9 mm above the carina. Shallow inspiration with atelectasis in both lung bases. Electronically Signed   By: Lucienne Capers M.D.   On: 03/21/2022 15:49       LOS: 1 day   Thornton Park  03/22/2022, 9:33 AM

## 2022-03-22 NOTE — Progress Notes (Signed)
NAME:  Erica Banks, MRN:  [id number], DOB:  1980/08/31, LOS: 1 ADMISSION DATE:  03/20/2022, CONSULTATION DATE:  03/21/2022 REFERRING MD:  Modena Nunnery, CHIEF COMPLAINT:  hematemesis   History of Present Illness:  42 year old woman w/ EtOH cirrhosis, ascites, hepatic encephalopathy presenting with N/V hematemesis. Taken for urgent EGD found to have red whale sign requiring banding, successful. Due to degree of bleeding and hx of severe encephalopathy, left of vent. PCCM to take over management.   Pertinent  Medical History  Alcoholic cirrhosis, ascites and recurrent hepatic encephalopathy DC 2/19 with hepatic encephalopathy and ESBL klebsiella UTI  Significant Hospital Events: Including procedures, antibiotic start and stop dates in addition to other pertinent events   2/22 admit, elective intubation, EGD, variceal banding by Dr. Tarri Glenn  2/23 extubated, discontinued sedation, clear liquid diet Restarted on home dose of lactulose and rifaximin  Interim History / Subjective:  Awake and alert, tolerated SBT, now off the ventilator and removed sedation Denies pain  Objective   Blood pressure 122/72, pulse 93, temperature 98.2 F (36.8 C), temperature source Oral, resp. rate 12, height '5\' 6"'$  (1.676 m), weight 69.4 kg, SpO2 100 %.   Off ventilation since 11AM Vent Mode: PRVC FiO2 (%):  [40 %-100 %] 40 % Set Rate:  [16 bmp-20 bmp] 20 bmp Vt Set:  [470 mL] 470 mL PEEP:  [5 cmH20] 5 cmH20 Pressure Support:  [10 cmH20] 10 cmH20 Plateau Pressure:  [17 cmH20] 17 cmH20   Intake/Output Summary (Last 24 hours) at 03/22/2022 1123 Last data filed at 03/22/2022 1014 Gross per 24 hour  Intake 2543.35 ml  Output 50 ml  Net 2493.35 ml   Filed Weights   03/20/22 2253 03/21/22 1537 03/22/22 0453  Weight: 68 kg 68 kg 69.4 kg    Examination: General: Ill appearing woman now extubated HENT: icteric sclera, no bloody secretions, MM Lungs: CTAB, no wheezing or ronchi, off the  ventilatory Cardiovascular: RRR, holosystolic murmur Abdomen: protuberant, soft abdomen Extremities: warm and Neuro: coming off sedation, arousal and following commands Skin: warm, spider angiomata on upper chest  Significant labs Hgb 10.3, stable WBC 14.8 Ammonia 175 Na 132 Cr 1.08 T bili 2.6 INR 1.9 Albumin 2.7  Resolved Hospital Problem list     Assessment & Plan:  Upper GI bleed Esophageal varices s/p banding x5 Hgb stable GI following, now on CLD Continue octeotride infusion Switch PPI to BID dosing Continue empiric antibiotics F/u H/H q12HR Transfuse for Hgb <7 May need IR consult for TIPS if rebleed Repeat EGD 3-4 weeks for additional banding  Alcoholic cirrhosis MELD 22 High risk of hepatic encephalopathy Ammonia 173 Restarted home lactulose and rifaximin Continue CTX for SBP CTM MELD and hepatic function  Malnutrition Had Cortrak at previous hospitalization Now on CLD CTM  Triglycerides Off of propofol now Monitor trend   Best Practice (right click and "Reselect all SmartList Selections" daily)   Diet/type: clear liquids DVT prophylaxis: SCD GI prophylaxis: PPI Lines: N/A Foley:  N/A Code Status:  full code Last date of multidisciplinary goals of care discussion '[]'$  husband updated by phone per GI note  Labs   CBC: Recent Labs  Lab 03/18/22 0346 03/20/22 2330 03/21/22 0654 03/21/22 1559 03/21/22 1720 03/21/22 1913 03/22/22 0029 03/22/22 0339 03/22/22 0707 03/22/22 0719  WBC 7.4 10.4  --  18.5*  --   --  14.8*  --   --  13.8*  NEUTROABS  --  6.1  --   --   --   --   --   --   --   --  HGB 8.4* 6.1*  6.1*   < > 10.6*  10.7*   < > 9.5* 9.9* 9.5* 10.3* 10.2*  HCT 25.9* 19.3*  18.0*   < > 32.2*  31.3*   < > 28.5* 30.1* 28.5* 29.4* 29.0*  MCV 92.2 95.1  --  88.5  --   --  88.3  --   --  87.9  PLT 161 242  --  293  --   --  189  --   --  241   < > = values in this interval not displayed.    Basic Metabolic Panel: Recent Labs   Lab 03/17/22 0313 03/18/22 0346 03/20/22 2330 03/21/22 1540 03/21/22 1559 03/21/22 1720 03/21/22 1913 03/22/22 0719  NA 137 134* 132*  134* 134* 135 134*  --  132*  K 4.0 3.7 3.0*  3.1* 4.5 4.5 4.5 4.4 4.7  CL 108 104 104  99  --  105  --   --  104  CO2 18* 19* 18*  --  21*  --   --  18*  GLUCOSE 125* 142* 197*  186*  --  145*  --   --  111*  BUN 27* '18 15  15  '$ --  20  --   --  31*  CREATININE 0.95 0.90 0.97  0.90  --  0.98  --   --  1.08*  CALCIUM 8.7* 8.6* 7.5*  --  7.9*  --   --  8.1*  MG 1.5*  --   --   --  1.4*  --   --  2.5*  PHOS 4.4  --   --   --   --   --   --  5.2*   GFR: Estimated Creatinine Clearance: 64.8 mL/min (A) (by C-G formula based on SCr of 1.08 mg/dL (H)). Recent Labs  Lab 03/20/22 2330 03/21/22 1559 03/22/22 0029 03/22/22 0719  WBC 10.4 18.5* 14.8* 13.8*    Liver Function Tests: Recent Labs  Lab 03/17/22 0313 03/18/22 0346 03/20/22 2330 03/22/22 0719  AST 72* 64* 43* 57*  ALT 56* 56* 35 38  ALKPHOS 84 102 74 83  BILITOT 3.9* 3.5* 2.6* 2.6*  PROT 5.6* 5.8* 4.4* 4.9*  ALBUMIN 3.2* 3.2* 2.4* 2.7*   Recent Labs  Lab 03/20/22 2330  LIPASE 84*   Recent Labs  Lab 03/20/22 2330 03/22/22 0719  AMMONIA 34 175*    ABG    Component Value Date/Time   PHART 7.353 03/21/2022 1720   PCO2ART 32.8 03/21/2022 1720   PO2ART 69 (L) 03/21/2022 1720   HCO3 18.4 (L) 03/21/2022 1720   TCO2 19 (L) 03/21/2022 1720   ACIDBASEDEF 7.0 (H) 03/21/2022 1720   O2SAT 93 03/21/2022 1720     Coagulation Profile: Recent Labs  Lab 03/20/22 2330 03/22/22 0719  INR 2.4* 1.9*    Cardiac Enzymes: No results for input(s): "CKTOTAL", "CKMB", "CKMBINDEX", "TROPONINI" in the last 168 hours.  HbA1C: Hgb A1c MFr Bld  Date/Time Value Ref Range Status  03/21/2022 03:58 PM 5.0 4.8 - 5.6 % Final    Comment:    (NOTE) Pre diabetes:          5.7%-6.4%  Diabetes:              >6.4%  Glycemic control for   <7.0% adults with diabetes     CBG: Recent  Labs  Lab 03/21/22 1540 03/21/22 1932 03/21/22 2333 03/22/22 0327 03/22/22 0738  GLUCAP 151* 121* 104* 107* 108*  Review of Systems:     Past Medical History:  She,  has a past medical history of Anemia, Cirrhosis ([place]), DVT (deep venous thrombosis) ([place]), Low iron, Neuropathy, Neuropathy, Panic attacks, and Pulmonary embolus ([place]) (2019).   Surgical History:   Past Surgical History:  Procedure Laterality Date   ABDOMINAL HYSTERECTOMY     BIOPSY N/A 06/24/2013   Procedure: BIOPSY TERMINAL ILEUM;  Surgeon: Daneil Dolin, MD;  [facility]: [facility];  Service: Endoscopy;  Laterality: N/A;   CHOLECYSTECTOMY     COLONOSCOPY N/A 06/24/2013   [id number] hemorrhoids/otherwise normal    COLONOSCOPY WITH PROPOFOL N/A 05/31/2021   Surgeon: Daneil Dolin, MD; ery large nonbleeding internal and external hemorrhoids, portal colopathy.  Recommended repeat colonoscopy in 10 years.   ESOPHAGOGASTRODUODENOSCOPY (EGD) WITH PROPOFOL N/A 03/29/2021   Surgeon: Eloise Harman, DO;  grade 1 esophageal varices, portal hypertensive gastropathy with friable tissue with spontaneous ooze, normal duodenum.   ESOPHAGOGASTRODUODENOSCOPY (EGD) WITH PROPOFOL N/A 05/31/2021   Surgeon: Daneil Dolin, MD;  grade 1-2 esophageal varices without bleeding stigmata, advanced appearing portal hypertensive gastropathy.   ESOPHAGOGASTRODUODENOSCOPY (EGD) WITH PROPOFOL N/A 12/22/2021   Procedure: ESOPHAGOGASTRODUODENOSCOPY (EGD) WITH PROPOFOL;  Surgeon: Harvel Quale, MD;  Location: [facility];  Service: Gastroenterology;  Laterality: N/A;   IR PARACENTESIS  03/13/2022   nasal bone surgery     NASAL SINUS SURGERY     SINUS IRRIGATION     TONSILLECTOMY     TUBAL LIGATION       Social History:   reports that she has been smoking cigarettes. She has a 6.00 pack-year smoking history. She has never used smokeless tobacco. She reports that she does not currently use alcohol after a past usage of  about 4.0 - 5.0 standard drinks of alcohol per week. She reports current drug use. Drug: Marijuana.   Family History:  Her family history includes COPD in her mother; Cancer in an other family member; Cirrhosis in her father; Crohn's disease in her maternal grandmother; Diabetes in her mother and sister; Hypertension in her mother and sister. There is no history of Colon cancer or Autoimmune disease.   Allergies Allergies  Allergen Reactions   Demerol Anaphylaxis    Per patient cardiac arrest     Home Medications  Prior to Admission medications   Medication Sig Start Date End Date Taking? Authorizing Provider  acetaminophen (TYLENOL) 500 MG tablet Take 1,000 mg by mouth daily as needed for moderate pain.   Yes [provider]  ciprofloxacin (CIPRO) 500 MG tablet Take 1 tablet (500 mg total) by mouth daily with breakfast. 03/19/22  Yes Cherene Altes, MD  dronabinol (MARINOL) 2.5 MG capsule Take 1 capsule (2.5 mg total) by mouth 2 (two) times daily before lunch and supper. Patient taking differently: Take 2.5 mg by mouth daily. 03/18/22  Yes Cherene Altes, MD  folic acid (FOLVITE) 1 MG tablet Take 1 tablet (1 mg total) by mouth daily. 03/19/22  Yes Cherene Altes, MD  furosemide (LASIX) 40 MG tablet Take 1 tablet (40 mg total) by mouth 2 (two) times daily. 03/18/22  Yes Cherene Altes, MD  lactulose (CHRONULAC) 10 GM/15ML solution Take 15 mLs (10 g total) by mouth 3 (three) times daily. 03/18/22 06/16/22 Yes Cherene Altes, MD  Multiple Vitamin (MULTIVITAMIN WITH MINERALS) TABS tablet Take 1 tablet by mouth daily. 03/18/22  Yes Cherene Altes, MD  pantoprazole (PROTONIX) 40 MG tablet Take 1 tablet (40 mg  total) by mouth 2 (two) times daily before a meal. 03/18/22  Yes Cherene Altes, MD  rifaximin (XIFAXAN) 550 MG TABS tablet Take 1 tablet (550 mg total) by mouth 2 (two) times daily. Patient taking differently: Take 550 mg by mouth daily. 03/18/22 06/16/22 Yes  Cherene Altes, MD  spironolactone (ALDACTONE) 50 MG tablet Take 1 tablet (50 mg total) by mouth daily. 03/19/22  Yes Cherene Altes, MD  thiamine (VITAMIN B1) 100 MG tablet Take 1 tablet (100 mg total) by mouth daily. 03/19/22  Yes Cherene Altes, MD  gabapentin (NEURONTIN) 300 MG capsule Take 300 mg by mouth 3 (three) times daily. Patient not taking: Reported on 03/21/2022    [provider]     Critical care time: 54 min      Romana Juniper, MD [facility] Internal Medicine Program - PGY-1 03/22/2022, 11:23 AM

## 2022-03-22 NOTE — Evaluation (Signed)
Physical Therapy Evaluation Patient Details Name: Erica Banks MRN: [id number] DOB: [birth year] Today's Date: 03/22/2022  History of Present Illness  42 yo female admitted 2/21 with N/V hematemesis 2/22 for EGD variceal banding with intubation 2/22-2/23. PMHx: recent D/C [id number], anemia, alcoholic cirrhosis, neuropathy, esopheal varices  Clinical Impression  Pt pleasant and eager to move, eat and return home. Pt with slight confusion regarding time and memory but following all commands and able to move and walk around unit with minguard assist. Pt with decreased activity tolerance, cognition and function who will benefit from acute therapy to maximize mobility and safety to decrease burden of care. Encouraged daily mobility OOB and up to toilet. PT with SPO2 100% on RA.      Recommendations for follow up therapy are one component of a multi-disciplinary discharge planning process, led by the attending physician.  Recommendations may be updated based on patient status, additional functional criteria and insurance authorization.  Follow Up Recommendations Outpatient PT Can patient physically be transported by private vehicle: Yes    Assistance Recommended at Discharge Set up Supervision/Assistance  Patient can return home with the following  Assistance with cooking/housework;Assist for transportation;Help with stairs or ramp for entrance;Direct supervision/assist for medications management;Direct supervision/assist for financial management    Equipment Recommendations Rollator (4 wheels)  Recommendations for Other Services       Functional Status Assessment Patient has had a recent decline in their functional status and demonstrates the ability to make significant improvements in function in a reasonable and predictable amount of time.     Precautions / Restrictions Precautions Precautions: Fall Precaution Comments: rectal tube      Mobility  Bed Mobility Overal bed mobility: Needs  Assistance Bed Mobility: Supine to Sit     Supine to sit: Supervision     General bed mobility comments: supervision for lines, HOB 30 degrees    Transfers Overall transfer level: Needs assistance   Transfers: Sit to/from Stand Sit to Stand: Min guard           General transfer comment: guarding for stability and lines from bed and recliner    Ambulation/Gait Ambulation/Gait assistance: Min guard Gait Distance (Feet): 150 Feet Assistive device: Rolling walker (2 wheels) Gait Pattern/deviations: Step-through pattern, Decreased stride length, Trunk flexed   Gait velocity interpretation: 1.31 - 2.62 ft/sec, indicative of limited community ambulator   General Gait Details: cues for direction and position in RW  Stairs            Wheelchair Mobility    Modified Rankin (Stroke Patients Only)       Balance Overall balance assessment: Mild deficits observed, not formally tested   Sitting balance-Leahy Scale: Good     Standing balance support: Single extremity supported, Bilateral upper extremity supported, Reliant on assistive device for balance Standing balance-Leahy Scale: Poor Standing balance comment: sinle or bil UE support                             Pertinent Vitals/Pain Pain Assessment Pain Assessment: No/denies pain    Home Living Family/patient expects to be discharged to:: Private residence Living Arrangements: Spouse/significant other;Children Available Help at Discharge: Family;Available 24 hours/day Type of Home: House Home Access: Stairs to enter Entrance Stairs-Rails: Can reach both Entrance Stairs-Number of Steps: 4   Home Layout: One level Home Equipment: Cane - single point;Shower seat      Prior Function Prior Level of Function : Independent/Modified Independent  Hand Dominance        Extremity/Trunk Assessment   Upper Extremity Assessment Upper Extremity Assessment: Overall WFL for  tasks assessed    Lower Extremity Assessment Lower Extremity Assessment: Generalized weakness    Cervical / Trunk Assessment Cervical / Trunk Assessment: Other exceptions Cervical / Trunk Exceptions: ascites  Communication      Cognition Arousal/Alertness: Awake/alert Behavior During Therapy: Flat affect Overall Cognitive Status: Impaired/Different from baseline Area of Impairment: Orientation                 Orientation Level: Disoriented to, Time Current Attention Level: Selective Memory: Decreased short-term memory Following Commands: Follows one step commands consistently     Problem Solving: Slow processing          General Comments      Exercises     Assessment/Plan    PT Assessment Patient needs continued PT services  PT Problem List Decreased strength;Decreased balance;Decreased mobility;Decreased knowledge of use of DME;Decreased safety awareness;Decreased activity tolerance       PT Treatment Interventions DME instruction;Gait training;Therapeutic activities;Therapeutic exercise;Balance training;Patient/family education;Stair training;Functional mobility training    PT Goals (Current goals can be found in the Care Plan section)  Acute Rehab PT Goals Patient Stated Goal: go home and cook PT Goal Formulation: With patient Time For Goal Achievement: 04/05/22 Potential to Achieve Goals: Good    Frequency Min 3X/week     Co-evaluation               AM-PAC PT "6 Clicks" Mobility  Outcome Measure Help needed turning from your back to your side while in a flat bed without using bedrails?: None Help needed moving from lying on your back to sitting on the side of a flat bed without using bedrails?: A Little Help needed moving to and from a bed to a chair (including a wheelchair)?: A Little Help needed standing up from a chair using your arms (e.g., wheelchair or bedside chair)?: A Little Help needed to walk in hospital room?: A Little Help  needed climbing 3-5 steps with a railing? : A Little 6 Click Score: 19    End of Session   Activity Tolerance: Patient tolerated treatment well Patient left: in chair;with call bell/phone within reach Nurse Communication: Mobility status PT Visit Diagnosis: Muscle weakness (generalized) (M62.81);Other abnormalities of gait and mobility (R26.89)    Time: [id number] PT Time Calculation (min) (ACUTE ONLY): 27 min   Charges:   PT Evaluation $PT Eval Moderate Complexity: 1 Mod          [place], PT Acute Rehabilitation Services Office: [phone]   Sandy Salaam Charisa Twitty 03/22/2022, 1:39 PM

## 2022-03-22 NOTE — Procedures (Signed)
Extubation Procedure Note  Patient Details:   Name: Erica Banks DOB: 09-23-1980 MRN: [id number]   Airway Documentation:    Vent end date: 03/22/22 Vent end time: 1037   Evaluation  O2 sats: stable throughout Complications: No apparent complications Patient did tolerate procedure well. Bilateral Breath Sounds: Diminished, Clear   Yes Positive cuff leak prior to extubation. Pt on 4L [place]  Lawernce Keas 03/22/2022, 10:37 AM

## 2022-03-23 DIAGNOSIS — K7682 Hepatic encephalopathy: Secondary | ICD-10-CM

## 2022-03-23 DIAGNOSIS — K766 Portal hypertension: Secondary | ICD-10-CM

## 2022-03-23 LAB — COMPREHENSIVE METABOLIC PANEL WITH GFR
ALT: 36 U/L (ref 0–44)
AST: 46 U/L — ABNORMAL HIGH (ref 15–41)
Albumin: 2.4 g/dL — ABNORMAL LOW (ref 3.5–5.0)
Alkaline Phosphatase: 76 U/L (ref 38–126)
Anion gap: 10 (ref 5–15)
BUN: 28 mg/dL — ABNORMAL HIGH (ref 6–20)
CO2: 17 mmol/L — ABNORMAL LOW (ref 22–32)
Calcium: 8 mg/dL — ABNORMAL LOW (ref 8.9–10.3)
Chloride: 102 mmol/L (ref 98–111)
Creatinine, Ser: 1.13 mg/dL — ABNORMAL HIGH (ref 0.44–1.00)
GFR, Estimated: >60 mL/min
Glucose, Bld: 159 mg/dL — ABNORMAL HIGH (ref 70–99)
Potassium: 4.2 mmol/L (ref 3.5–5.1)
Sodium: 129 mmol/L — ABNORMAL LOW (ref 135–145)
Total Bilirubin: 2.7 mg/dL — ABNORMAL HIGH (ref 0.3–1.2)
Total Protein: 4.6 g/dL — ABNORMAL LOW (ref 6.5–8.1)

## 2022-03-23 LAB — CBC
HCT: 24.2 % — ABNORMAL LOW (ref 36.0–46.0)
Hemoglobin: 8.3 g/dL — ABNORMAL LOW (ref 12.0–15.0)
MCH: 29.6 pg (ref 26.0–34.0)
MCHC: 34.3 g/dL (ref 30.0–36.0)
MCV: 86.4 fL (ref 80.0–100.0)
Platelets: 217 10*3/uL (ref 150–400)
RBC: 2.8 MIL/uL — ABNORMAL LOW (ref 3.87–5.11)
RDW: 18.1 % — ABNORMAL HIGH (ref 11.5–15.5)
WBC: 10.4 10*3/uL (ref 4.0–10.5)
nRBC: 0 % (ref 0.0–0.2)

## 2022-03-23 LAB — GLUCOSE, CAPILLARY
Glucose-Capillary: 124 mg/dL — ABNORMAL HIGH (ref 70–99)
Glucose-Capillary: 135 mg/dL — ABNORMAL HIGH (ref 70–99)
Glucose-Capillary: 122 mg/dL — ABNORMAL HIGH (ref 70–99)
Glucose-Capillary: 116 mg/dL — ABNORMAL HIGH (ref 70–99)

## 2022-03-23 LAB — PROTIME-INR
INR: 1.9 — ABNORMAL HIGH (ref 0.8–1.2)
Prothrombin Time: 21.3 s — ABNORMAL HIGH (ref 11.4–15.2)

## 2022-03-23 MED ORDER — NADOLOL 20 MG PO TABS
20.0000 mg | ORAL_TABLET | Freq: Every day | ORAL | Status: DC
  Filled 2022-03-23: qty 1

## 2022-03-23 NOTE — Progress Notes (Addendum)
[facility] Progress Note  CC:  UGI bleed  Assessment / Plan: 42 year old female history of decompensated alcoholic cirrhosis with ascites and history of SBP, coagulopathy, recent hospitalization for hepatic encephalopathy and HRS admitted with esophageal variceal bleed treated with band ligation 03/21/22. She also has a history of chronic iron deficiency anemia requiring both IV iron and PRBCs prior to this hospitalization and a history of DVT/PE not on anticoagulation.   She has had several melanic stools a decline in hemoglobin overnight. Hopefully this is old blood and not additional bleeding. Will need to continue to carefully document stool output.  She has developed hyponatremia and her creatinine is now increasing. She clinically has grade 2 hepatic encephalopathy.    MELD 3.0 25   Recommendations: - Serial hgb/hct with transfusion as indicated - Complete 72 hours of octreotide continuous infusion - Continue PPI IV BID - Complete 5 days of empiric antibiotics - Consider TIPS if there is evidence for further ongoing overt GI bleeding, however she is at high risk for post-TIPS worsening of her hepatic encephalopathy - Clear liquid diet - may advance when no concerns for active bleeding - Consider IV albumin with progressive renal dysfunction - Continue Xifaxan and lactulose with titration of lactulose as needed - Start non-selective beta-blocker after complete octreotide if renal function stabilizes - Repeat EGD in 3-4 weeks for additional banding - Formal alcohol counseling on discharge - Outpatient transplant evaluation - patient has contacted [facility] already - May need to consider inpatient transplant evaluation if she develops progressive renal dysfunction   Subjective: Alert. Wants to eat. No abdominal pain, nausea, or heartburn. GI ROS is negative.  Bedside nurse reports several large melanic stools overnight.  No family present at the time of my  evaluation.   Objective:  Vital signs in last 24 hours: Temp:  [97.7 F (36.5 C)-98.9 F (37.2 C)] 98.9 F (37.2 C) (02/24 0315) Pulse Rate:  [76-106] 100 (02/24 0600) Resp:  [11-26] 13 (02/24 0600) BP: (106-140)/(57-79) 128/67 (02/24 0600) SpO2:  [98 %-100 %] 100 % (02/24 0600) FiO2 (%):  [40 %] 40 % (02/23 0900) Last BM Date : 03/22/22 General:   Alert Heart:  Tachycardic Pulm: Clear anteriorly Abdomen:  Protuberent but soft. Nontender. Normal bowel sounds. No rebound or guarding. Reducible umbilical hernia.  Extremities:  Without edema. Neurologic:  Alert &O x 3 - 2023. + [place] Psych:  Alert. Bright affect.   Lab Results: Recent Labs    03/22/22 0029 03/22/22 0339 03/22/22 0707 03/22/22 0719 03/23/22 0131  WBC 14.8*  --   --  13.8* 10.4  HGB 9.9*   < > 10.3* 10.2* 8.3*  HCT 30.1*   < > 29.4* 29.0* 24.2*  PLT 189  --   --  241 217   < > = values in this interval not displayed.   BMET Recent Labs    03/21/22 1559 03/21/22 1720 03/21/22 1913 03/22/22 0719 03/23/22 0131  NA 135 134*  --  132* 129*  K 4.5 4.5 4.4 4.7 4.2  CL 105  --   --  104 102  CO2 21*  --   --  18* 17*  GLUCOSE 145*  --   --  111* 159*  BUN 20  --   --  31* 28*  CREATININE 0.98  --   --  1.08* 1.13*  CALCIUM 7.9*  --   --  8.1* 8.0*   LFT Recent Labs    03/20/22 2330 03/22/22  0719 03/23/22 0131  PROT 4.4*   < > 4.6*  ALBUMIN 2.4*   < > 2.4*  AST 43*   < > 46*  ALT 35   < > 36  ALKPHOS 74   < > 76  BILITOT 2.6*   < > 2.7*  BILIDIR 0.9*  --   --   IBILI 1.7*  --   --    < > = values in this interval not displayed.   PT/INR Recent Labs    03/22/22 0719 03/23/22 0131  LABPROT 21.3* 21.3*  INR 1.9* 1.9*   Hepatitis Panel No results for input(s): "HEPBSAG", "HCVAB", "HEPAIGM", "HEPBIGM" in the last 72 hours.  DG Chest Port 1 View  Result Date: 03/22/2022 CLINICAL DATA:  [id number] with ventilator dependent respiratory failure. EXAM: PORTABLE CHEST 1 VIEW COMPARISON:   Portable chest yesterday at 3:24 p.m. FINDINGS: 4:50 a.m. ETT interval pullback to 2 cm from the carina, was previously only a few mm over it. The cardiomediastinal silhouette and vascular markings are normal. The lungs are expiratory obscuring the bases, with the visualized lungs clear. No acute osseous findings. Stable overall aeration. IMPRESSION: 1. ETT interval pullback to 2 cm from the carina. 2. Expiratory chest with the visualized lungs clear. The lung bases are not evaluated. Electronically Signed   By: Telford Nab M.D.   On: 03/22/2022 06:14   Portable Chest x-ray  Result Date: 03/21/2022 CLINICAL DATA:  Endotracheal tube placement EXAM: PORTABLE CHEST 1 VIEW COMPARISON:  03/10/2022 FINDINGS: An endotracheal tube has been placed with tip measuring about 9 mm above the carina. Previous enteric tube is been removed. Shallow inspiration with linear atelectasis in the lung bases. Heart size and pulmonary vascularity are normal for technique. No pleural effusions. No pneumothorax. IMPRESSION: Endotracheal tube placed with tip measuring about 9 mm above the carina. Shallow inspiration with atelectasis in both lung bases. Electronically Signed   By: Lucienne Capers M.D.   On: 03/21/2022 15:49       LOS: 2 days   [place]  03/23/2022, 7:32 AM

## 2022-03-23 NOTE — Progress Notes (Signed)
   NAME:  Erica Banks, MRN:  [id number], DOB:  [birth year], LOS: 2 ADMISSION DATE:  03/20/2022, CONSULTATION DATE:  03/21/2022 REFERRING MD:  Modena Nunnery, CHIEF COMPLAINT:  hematemesis   History of Present Illness:  42 year old woman w/ EtOH cirrhosis, ascites, hepatic encephalopathy presenting with N/V hematemesis. Taken for urgent EGD found to have red whale sign requiring banding, successful. Due to degree of bleeding and hx of severe encephalopathy, left of vent. PCCM to take over management.   Pertinent  Medical History  Alcoholic cirrhosis, ascites and recurrent hepatic encephalopathy DC 2/19 with hepatic encephalopathy and ESBL klebsiella UTI  Significant Hospital Events: Including procedures, antibiotic start and stop dates in addition to other pertinent events   2/22 admit, elective intubation, EGD, variceal banding by Dr. Tarri Glenn  2/23 extubated, discontinued sedation, clear liquid diet Restarted on home dose of lactulose and rifaximin  Interim History / Subjective:  Started ambulation yesterday.  PT recommending outpatient physiotherapy.  Objective   Blood pressure 128/67, pulse 100, temperature 98.9 F (37.2 C), temperature source Oral, resp. rate 13, height 5' 6"$  (1.676 m), weight 69.4 kg, SpO2 100 %.   Off ventilation since 11AM Vent Mode: PRVC FiO2 (%):  [40 %] 40 % Set Rate:  [20 bmp] 20 bmp Vt Set:  [470 mL] 470 mL PEEP:  [5 cmH20] 5 cmH20 Pressure Support:  [10 cmH20] 10 cmH20   Intake/Output Summary (Last 24 hours) at 03/23/2022 0740 Last data filed at 03/23/2022 0600 Gross per 24 hour  Intake 665.47 ml  Output 50 ml  Net 615.47 ml    Filed Weights   03/20/22 2253 03/21/22 1537 03/22/22 0453  Weight: 68 kg 68 kg 69.4 kg    Examination: General: Ill appearing woman now extubated HENT: icteric sclera, no bloody secretions, MM Lungs: CTAB, no wheezing or ronchi,  Cardiovascular: RRR, holosystolic murmur Abdomen: protuberant, soft abdomen Extremities: warm,  no asterixis Neuro: Calm no focal deficits.  Fully conversant.  Mild inattentiveness. Skin: warm, spider angiomata on upper chest  Resolved Hospital Problem list   Sodium 129 Creatinine 1.13 Hemoglobin 8.3 INR 1.9  Assessment & Plan:  Upper GI bleed secondary to esophageal varices Status post banding x 5 Alcoholic cirrhosis MELD 22 History of hepatic encephalopathy Chronic protein calorie malnutrition  Plan:  -Transfer from ICU today. -Progressive ambulation. -Advance diet as per GI -Complete 72 hours octreotide -Transition PPI to twice daily orally -Start nadolol prophylaxis. -Complete course of prophylactic ceftriaxone following variceal bleed to prevent SBP -Continue prophylactic lactulose and rifaximin.  No overt hepatic encephalopathy at this time.  Best Practice (right click and "Reselect all SmartList Selections" daily)   Diet/type: clear liquids DVT prophylaxis: SCD GI prophylaxis: PPI Lines: N/A Foley:  N/A Code Status:  full code Last date of multidisciplinary goals of care discussion []$  husband updated by phone per GI note  Kipp Brood, MD [facility] ICU Physician [place]  Pager: [phone] Or Epic Secure Chat After hours: [phone].  03/23/2022, 7:57 AM    \   03/23/2022, 7:40 AM

## 2022-03-24 ENCOUNTER — Encounter (HOSPITAL_COMMUNITY): Payer: Self-pay | Admitting: Gastroenterology

## 2022-03-24 DIAGNOSIS — I8511 Secondary esophageal varices with bleeding: Secondary | ICD-10-CM

## 2022-03-24 DIAGNOSIS — K7031 Alcoholic cirrhosis of liver with ascites: Principal | ICD-10-CM

## 2022-03-24 DIAGNOSIS — Z515 Encounter for palliative care: Secondary | ICD-10-CM

## 2022-03-24 DIAGNOSIS — D509 Iron deficiency anemia, unspecified: Secondary | ICD-10-CM

## 2022-03-24 DIAGNOSIS — K921 Melena: Secondary | ICD-10-CM

## 2022-03-24 DIAGNOSIS — E870 Hyperosmolality and hypernatremia: Secondary | ICD-10-CM

## 2022-03-24 DIAGNOSIS — Z7189 Other specified counseling: Secondary | ICD-10-CM | POA: Diagnosis not present

## 2022-03-24 DIAGNOSIS — Z86718 Personal history of other venous thrombosis and embolism: Secondary | ICD-10-CM

## 2022-03-24 DIAGNOSIS — K2289 Other specified disease of esophagus: Secondary | ICD-10-CM | POA: Diagnosis not present

## 2022-03-24 LAB — CBC
HCT: 25 % — ABNORMAL LOW (ref 36.0–46.0)
Hemoglobin: 8.1 g/dL — ABNORMAL LOW (ref 12.0–15.0)
MCH: 28.9 pg (ref 26.0–34.0)
MCHC: 32.4 g/dL (ref 30.0–36.0)
MCV: 89.3 fL (ref 80.0–100.0)
Platelets: 194 10*3/uL (ref 150–400)
RBC: 2.8 MIL/uL — ABNORMAL LOW (ref 3.87–5.11)
RDW: 18.4 % — ABNORMAL HIGH (ref 11.5–15.5)
WBC: 8.3 10*3/uL (ref 4.0–10.5)
nRBC: 0 % (ref 0.0–0.2)

## 2022-03-24 LAB — TYPE AND SCREEN
ABO/RH(D): O POS
Antibody Screen: NEGATIVE
Unit division: 0
Unit division: 0
Unit division: 0
Unit division: 0
Unit division: 0

## 2022-03-24 LAB — BPAM RBC
Blood Product Expiration Date: 202403012359
Blood Product Expiration Date: 202403242359
Blood Product Expiration Date: 202403252359
Blood Product Expiration Date: 202403252359
Blood Product Expiration Date: 202403252359
ISSUE DATE / TIME: 202402181711
ISSUE DATE / TIME: 202402212354
ISSUE DATE / TIME: 202402220043
ISSUE DATE / TIME: 202402220253
ISSUE DATE / TIME: 202402221441
Unit Type and Rh: 5100
Unit Type and Rh: 5100
Unit Type and Rh: 5100
Unit Type and Rh: 5100
Unit Type and Rh: 9500

## 2022-03-24 LAB — COMPREHENSIVE METABOLIC PANEL
ALT: 37 U/L (ref 0–44)
AST: 52 U/L — ABNORMAL HIGH (ref 15–41)
Albumin: 2.5 g/dL — ABNORMAL LOW (ref 3.5–5.0)
Alkaline Phosphatase: 91 U/L (ref 38–126)
Anion gap: 8 (ref 5–15)
BUN: 16 mg/dL (ref 6–20)
CO2: 19 mmol/L — ABNORMAL LOW (ref 22–32)
Calcium: 8 mg/dL — ABNORMAL LOW (ref 8.9–10.3)
Chloride: 104 mmol/L (ref 98–111)
Creatinine, Ser: 0.88 mg/dL (ref 0.44–1.00)
GFR, Estimated: >60 mL/min (ref >60)
Glucose, Bld: 115 mg/dL — ABNORMAL HIGH (ref 70–99)
Potassium: 3.4 mmol/L — ABNORMAL LOW (ref 3.5–5.1)
Sodium: 131 mmol/L — ABNORMAL LOW (ref 135–145)
Total Bilirubin: 2.8 mg/dL — ABNORMAL HIGH (ref 0.3–1.2)
Total Protein: 4.6 g/dL — ABNORMAL LOW (ref 6.5–8.1)

## 2022-03-24 LAB — GLUCOSE, CAPILLARY
Glucose-Capillary: 114 mg/dL — ABNORMAL HIGH (ref 70–99)
Glucose-Capillary: 156 mg/dL — ABNORMAL HIGH (ref 70–99)
Glucose-Capillary: 146 mg/dL — ABNORMAL HIGH (ref 70–99)
Glucose-Capillary: 123 mg/dL — ABNORMAL HIGH (ref 70–99)

## 2022-03-24 LAB — PROTIME-INR
INR: 1.8 — ABNORMAL HIGH (ref 0.8–1.2)
Prothrombin Time: 20.4 seconds — ABNORMAL HIGH (ref 11.4–15.2)

## 2022-03-24 MED ORDER — SPIRONOLACTONE 25 MG PO TABS
25.0000 mg | ORAL_TABLET | Freq: Every day | ORAL | Status: DC
  Administered 2022-03-24 – 2022-03-25 (×2): 25 mg via ORAL
  Filled 2022-03-24 (×2): qty 1

## 2022-03-24 MED ORDER — THIAMINE MONONITRATE 100 MG PO TABS
100.0000 mg | ORAL_TABLET | Freq: Every day | ORAL | Status: DC
  Administered 2022-03-24 – 2022-03-26 (×3): 100 mg via ORAL
  Filled 2022-03-24 (×3): qty 1

## 2022-03-24 MED ORDER — ALBUMIN HUMAN 25 % IV SOLN
100.0000 g | Freq: Once | INTRAVENOUS | Status: DC
  Filled 2022-03-24: qty 400

## 2022-03-24 MED ORDER — CARVEDILOL 6.25 MG PO TABS: 6.2500 mg | ORAL_TABLET | Freq: Every day | ORAL | Status: DC

## 2022-03-24 MED ORDER — POTASSIUM CHLORIDE CRYS ER 20 MEQ PO TBCR
40.0000 meq | EXTENDED_RELEASE_TABLET | Freq: Two times a day (BID) | ORAL | Status: AC
  Administered 2022-03-24 (×2): 40 meq via ORAL
  Filled 2022-03-24 (×2): qty 2

## 2022-03-24 MED ORDER — FUROSEMIDE 40 MG PO TABS
40.0000 mg | ORAL_TABLET | Freq: Every day | ORAL | Status: DC
  Administered 2022-03-24: 40 mg via ORAL
  Filled 2022-03-24: qty 1

## 2022-03-24 MED ORDER — ALBUMIN HUMAN 25 % IV SOLN
50.0000 g | Freq: Once | INTRAVENOUS | Status: AC
  Administered 2022-03-24: 50 g via INTRAVENOUS
  Filled 2022-03-24: qty 200

## 2022-03-24 NOTE — Progress Notes (Signed)
Mobility Specialist - Progress Note   03/24/22 0826  Mobility  Activity Ambulated with assistance in hallway  Level of Assistance Contact guard assist, steadying assist  Assistive Device None  Distance Ambulated (ft) 550 ft  Activity Response Tolerated well  Mobility Referral Yes  $Mobility charge 1 Mobility    Pt received in bed agreeable to mobility. No complaints throughout, tolerated well. Left sitting EOB w/ all needs met and call bell within reach.   [place] Specialist Please contact via SecureChat or Rehab office at [phone]

## 2022-03-24 NOTE — Progress Notes (Signed)
[facility] PROGRESS NOTE    Progress Note  Erica Banks  [id number] DOB: June 29, 1980 DOA: 03/20/2022 PCP: Neale Burly, MD     Brief Narrative:   Erica Banks is an 42 y.o. female past medical history of alcoholic cirrhosis with ascites, encephalopathy comes in for nausea vomiting and hematemesis urgently taken to the EGD which required banding of esophageal varices left on the vent PCCM manage, extubated on 03/22/2022 sedation discontinued tolerating her liquid diet was resumed on lactulose and rifaximin the patient and transferred to [facility] on 03/24/2022  Assessment/Plan:   Acute upper GI bleed likely due to  Esophageal varices status post banding x 5: With a MELD score of 25.  Has a 20% mortality at 3 months Currently on a PPI twice a day started on nadolol once she had completed 72 hours, the patient relates she has not tolerated nadolol in the past as it makes her orthostatics will discuss with GI Continue Rocephin for SBP prophylaxis. Continue lactulose and rifaximin. GI on board recommended 72 hours with IVF to try continue IV PPI and 5 days of antibiotics. If she rebleeds may need TIPS. EGD in 3 to 4 weeks. Start low-dose Aldactone and Lasix continue to monitor electrolytes daily. Poor prognosis will consult palliative care. Recheck basic metabolic panel tomorrow we have started Aldactone and Lasix   Portal hypertension ([place]) With a recent esophageal bleed, does not tolerate nadolol will discuss with GI.  Encephalopathy, hepatic (HCC) Continue rifaximin and lactulose mentation is improved.   DVT prophylaxis: scd Family Communication:none Status is: Inpatient Remains inpatient appropriate because: Esophageal bleed.    Code Status:     Code Status Orders  (From admission, onward)           Start     Ordered   03/21/22 0723  Full code  Continuous       Question:  By:  Answer:  Other   03/21/22 0724           Code Status History      Date Active Date Inactive Code Status Order ID Comments User Context   03/01/2022 2119 03/18/2022 2028 Full Code [id number]  Marcy Panning Inpatient   02/13/2022 2058 02/16/2022 1807 Full Code [id number]  Rolla Plate, DO ED   02/10/2022 1933 02/11/2022 2042 Full Code [id number]  Rolla Plate, DO ED   12/19/2021 0043 12/22/2021 1942 Full Code [id number]  Bernadette Hoit, DO Inpatient   11/23/2021 0445 11/24/2021 2112 Full Code [id number]  Boneau, Snyder, DO Inpatient   11/23/2021 0403 11/23/2021 0445 DNR [id number]  Rolla Plate, DO Inpatient   03/27/2021 0922 03/30/2021 2208 Full Code [id number]  Orson Eva, MD ED   07/20/2019 0714 07/20/2019 2352 Full Code [id number]  Reubin Milan, MD ED   01/01/2015 2309 01/02/2015 2131 Full Code [id number]  Theressa Millard, MD Inpatient   03/05/2012 1412 03/05/2012 2253 Full Code [id number]  Sharyon Cable, MD ED         IV Access:   Peripheral IV   Procedures and diagnostic studies:   No results found.   Medical Consultants:   None.   Subjective:    Erica Banks relates she is hungry would like her diet advance we will emotion  Objective:    Vitals:   03/23/22 1303 03/23/22 1711 03/23/22 2144 03/24/22 0518  BP: 133/74 129/68 120/68 109/67  Pulse: 84 81 96 85  Resp: '18 17 17 16  '$ Temp: 98.5  F (36.9 C) 98.3 F (36.8 C) 98 F (36.7 C)   TempSrc: Oral Oral Oral   SpO2: 100% 100% 100% 97%  Weight:      Height:       SpO2: 97 % O2 Flow Rate (L/min): 2 L/min FiO2 (%): 40 %   Intake/Output Summary (Last 24 hours) at 03/24/2022 0657 Last data filed at 03/23/2022 1512 Gross per 24 hour  Intake 218.62 ml  Output --  Net 218.62 ml   Filed Weights   03/20/22 2253 03/21/22 1537 03/22/22 0453  Weight: 68 kg 68 kg 69.4 kg    Exam: General exam: In no acute distress. Respiratory system: Good air movement and clear to auscultation. Cardiovascular system: S1 & S2 heard, RRR. No JVD.   Gastrointestinal system: Abdomen is nondistended, soft and nontender.  Extremities: No pedal edema. Skin: No rashes, lesions or ulcers Psychiatry: Judgement and insight appear normal. Mood & affect appropriate.    Data Reviewed:    Labs: Basic Metabolic Panel: Recent Labs  Lab 03/20/22 2330 03/21/22 1540 03/21/22 1559 03/21/22 1720 03/21/22 1913 03/22/22 0719 03/23/22 0131 03/24/22 0236  NA 132*  134*   < > 135 134*  --  132* 129* 131*  K 3.0*  3.1*   < > 4.5 4.5   < > 4.7 4.2 3.4*  CL 104  99  --  105  --   --  104 102 104  CO2 18*  --  21*  --   --  18* 17* 19*  GLUCOSE 197*  186*  --  145*  --   --  111* 159* 115*  BUN 15  15  --  20  --   --  31* 28* 16  CREATININE 0.97  0.90  --  0.98  --   --  1.08* 1.13* 0.88  CALCIUM 7.5*  --  7.9*  --   --  8.1* 8.0* 8.0*  MG  --   --  1.4*  --   --  2.5*  --   --   PHOS  --   --   --   --   --  5.2*  --   --    < > = values in this interval not displayed.   GFR Estimated Creatinine Clearance: 79.6 mL/min (by C-G formula based on SCr of 0.88 mg/dL). Liver Function Tests: Recent Labs  Lab 03/18/22 0346 03/20/22 2330 03/22/22 0719 03/23/22 0131 03/24/22 0236  AST 64* 43* 57* 46* 52*  ALT 56* 35 38 36 37  ALKPHOS 102 74 83 76 91  BILITOT 3.5* 2.6* 2.6* 2.7* 2.8*  PROT 5.8* 4.4* 4.9* 4.6* 4.6*  ALBUMIN 3.2* 2.4* 2.7* 2.4* 2.5*   Recent Labs  Lab 03/20/22 2330  LIPASE 84*   Recent Labs  Lab 03/20/22 2330 03/22/22 0719  AMMONIA 34 175*   Coagulation profile Recent Labs  Lab 03/20/22 2330 03/22/22 0719 03/23/22 0131 03/24/22 0236  INR 2.4* 1.9* 1.9* 1.8*   COVID-19 Labs  No results for input(s): "DDIMER", "FERRITIN", "LDH", "CRP" in the last 72 hours.  Lab Results  Component Value Date   SARSCOV2NAA NEGATIVE 12/18/2021   SARSCOV2NAA NEGATIVE 03/27/2021   [place] NEGATIVE 08/18/2020   [place] NEGATIVE 07/20/2019    CBC: Recent Labs  Lab 03/20/22 2330 03/21/22 0654 03/21/22 1559  03/21/22 1720 03/22/22 0029 03/22/22 0339 03/22/22 0707 03/22/22 0719 03/23/22 0131 03/24/22 0236  WBC 10.4  --  18.5*  --  14.8*  --   --  13.8* 10.4 8.3  NEUTROABS 6.1  --   --   --   --   --   --   --   --   --   HGB 6.1*  6.1*   < > 10.6*  10.7*   < > 9.9* 9.5* 10.3* 10.2* 8.3* 8.1*  HCT 19.3*  18.0*   < > 32.2*  31.3*   < > 30.1* 28.5* 29.4* 29.0* 24.2* 25.0*  MCV 95.1  --  88.5  --  88.3  --   --  87.9 86.4 89.3  PLT 242  --  293  --  189  --   --  241 217 194   < > = values in this interval not displayed.   Cardiac Enzymes: No results for input(s): "CKTOTAL", "CKMB", "CKMBINDEX", "TROPONINI" in the last 168 hours. BNP (last 3 results) No results for input(s): "PROBNP" in the last 8760 hours. CBG: Recent Labs  Lab 03/22/22 2115 03/23/22 0755 03/23/22 1129 03/23/22 1652 03/23/22 2140  GLUCAP 143* 124* 135* 122* 116*   D-Dimer: No results for input(s): "DDIMER" in the last 72 hours. Hgb A1c: Recent Labs    03/21/22 1558  HGBA1C 5.0   Lipid Profile: Recent Labs    03/22/22 0719  TRIG 659*   Thyroid function studies: No results for input(s): "TSH", "T4TOTAL", "T3FREE", "THYROIDAB" in the last 72 hours.  Invalid input(s): "FREET3" Anemia work up: No results for input(s): "VITAMINB12", "FOLATE", "FERRITIN", "TIBC", "IRON", "RETICCTPCT" in the last 72 hours. Sepsis Labs: Recent Labs  Lab 03/22/22 0029 03/22/22 0719 03/23/22 0131 03/24/22 0236  WBC 14.8* 13.8* 10.4 8.3   Microbiology Recent Results (from the past 240 hour(s))  MRSA Next Gen by PCR, Nasal     Status: None   Collection Time: 03/21/22  3:19 PM   Specimen: Nasal Mucosa; Nasal Swab  Result Value Ref Range Status   MRSA by PCR Next Gen NOT DETECTED NOT DETECTED Final    Comment: (NOTE) The GeneXpert MRSA Assay (FDA approved for NASAL specimens only), is one component of a comprehensive MRSA colonization surveillance program. It is not intended to diagnose MRSA infection nor to  guide or monitor treatment for MRSA infections. Test performance is not FDA approved in patients less than 45 years old. Performed at [facility], [place] [address], [place], [place] [address]      Medications:    Chlorhexidine Gluconate Cloth  6 each Topical Daily   insulin aspart  0-5 Units Subcutaneous QHS   insulin aspart  0-6 Units Subcutaneous TID WC   lactulose  10 g Oral TID   pantoprazole  40 mg Intravenous Q12H   rifaximin  550 mg Oral Daily   sodium chloride flush  3 mL Intravenous Q12H   thiamine (VITAMIN B1) injection  100 mg Intravenous Daily   Continuous Infusions:  sodium chloride     cefTRIAXone (ROCEPHIN)  IV 2 g (03/24/22 0148)   octreotide (SANDOSTATIN) 500 mcg in sodium chloride 0.9 % 250 mL (2 mcg/mL) infusion 50 mcg/hr (03/23/22 1549)      LOS: 3 days   Charlynne Cousins  [facility]  03/24/2022, 6:57 AM

## 2022-03-24 NOTE — Consult Note (Addendum)
Palliative Medicine Inpatient Consult Note  Consulting Provider: Dr. Olevia Bowens  Reason for consult:   Erica Banks Palliative Medicine Consult  Reason for Consult? end of life   03/24/2022  HPI:  Per intake H&P --> Erica Banks is an 42 y.o. female past medical history of alcoholic cirrhosis with ascites, encephalopathy comes in for nausea vomiting and hematemesis urgently taken to the EGD which required banding of esophageal varices left on the vent PCCM manage, extubated on 03/22/2022 sedation discontinued tolerating her liquid diet was resumed on lactulose and rifaximin the patient and transferred to [facility] on 03/24/2022.  Palliative care asked to get involved in the setting of an increased MELD score to 25 with high succeeded 51-monthmortality risk.   Clinical Assessment/Goals of Care:  *Please note that this is a verbal dictation therefore any spelling or grammatical errors are due to the "DSarbenOne" system interpretation.  I have reviewed medical records including EPIC notes, labs and imaging, received report from bedside RN, assessed the patient who is getting out of bed.    I met with Erica Banks to further discuss diagnosis prognosis, GOC, EOL wishes, disposition and options.   I introduced Palliative Medicine as specialized medical care for people living with serious illness. It focuses on providing relief from the symptoms and stress of a serious illness. The goal is to improve quality of life for both the patient and the family.  Medical History Review and Understanding:  A review of Erica Banks's past medical history inclusive of anemia, neuropathy, anxiety/panic attacks, pulmonary embolism, and cirrhosis diagnosed roughly 1 year ago was held.  Social History:  [place]  She has been married to her husband for the past 22 years.  They share a son who is 210years old and a [place] 41[place]  Erica Banks has not been able to work for period of time due to her disease process.  She is a woman of faith and practices within the [facility].  Erica Banks endorses that what gives her the greatest joy in life is spending time with her family.  Functional and Nutritional State:  Erica Banks for the past year has had a significant decline in her overall health.  She had previous to admission been able to perform all basic activities of daily living and instrumental activities of daily living.  Advance Directives:  A detailed discussion was had today regarding advanced directives.  Has never completed advanced directives though would be interested in doing so during hospitalization.  Code Status:  Concepts specific to code status, artifical feeding and hydration, continued IV antibiotics and rehospitalization was had.  The difference between a aggressive medical intervention path  and a palliative comfort care path for this patient at this time was had.   At this time Erica Banks is willing to accept any and all treatments to sustain life.  She does vocalize the caveat of not desiring to live on machines artificially for the duration of her life.  Discussion:  Erica Banks and I reviewed that she comes from a family of alcoholics most notably on her paternal side.  She shares that she had drank quite heavily over a decade and often went to bed inebriated only to wake up the next morning and do it all again.  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  Erica Banks endorses that she has not had a drink in a year and was on the roster at [facility] for consideration of transplant.  She has spoken to the Education officer, museum through [facility] transplant services and is hopeful to be able to have a consultation in the near future.  Her son would like to be a live liver donor.  We  discussed that the additional laboratory testing has not been completed to verify that he is an appropriate candidate but there are plans to pursue this.  Did discuss crystals MELD score and the severity of it as well as her complicated hospitalization and gastric bleeding.  Erica Banks is very tearful during my time with her as she does have a degree of knowledge in terms of how severe her diseases and endorses that she has a "lock to straighten out" in the immediate future.  We reviewed the importance of advance care planning to better understand what her wishes would be as well as to set her family up in the future so that they are not placed with difficult decisions which they do not know the answer to.  I shared that we would continue to support Erica Banks and also endorsed the importance of outpatient support.  Erica Banks at this time is open to having follow-up with outpatient palliative care through [facility].  Discussed the importance of continued conversation with family and their  medical providers regarding overall plan of care and treatment options, ensuring decisions are within the context of the patients values and GOCs.  Decision Maker: Erica Banks is able to make decisions for herself though if unable to would rely on her husband would Erica Banks.  SUMMARY OF RECOMMENDATIONS   Code/full scope of care  Been an honest conversations held in the setting of patient's liver disease  Patient shares that her goals are to have a live liver transplant at [facility] --> for advanced directives in prayer.  Provided patient with a copy of advance directives for review  Ongoing palliative support and coordination with the gastroenterology team for further conversations  Code Status/Advance Care Planning: FULL CODE  Palliative Prophylaxis:  Aspiration, Bowel Regimen, Delirium Protocol, Frequent Pain Assessment, Oral Care, Palliative Wound Care, and Turn  Reposition  Additional Recommendations (Limitations, Scope, Preferences): Continue current care  Psycho-social/Spiritual:  Desire for further Chaplaincy support: Yes Additional Recommendations: Education on liver cirrhosis   Prognosis: Has a high 80-monthmortality risk based upon her present MELD score  Discharge Planning: Discharge plan is uncertain at this time  Vitals:   03/23/22 2144 03/24/22 0518  BP: 120/68 109/67  Pulse: 96 85  Resp: 17 16  Temp: 98 F (36.7 C)   SpO2: 100% 97%    Intake/Output Summary (Last 24 hours) at 03/24/2022 0723 Last data filed at 03/23/2022 1512 Gross per 24 hour  Intake 218.62 ml  Output --  Net 218.62 ml   Last Weight  Most recent update: 03/22/2022  4:53 AM    Weight  69.4 kg (153 lb)            Gen: Middle-age Caucasian woman in no acute distress HEENT: moist mucous membranes CV: Regular rate and rhythm PULM: Room air breathing is even and unlabored ABD: Distended, and panic EXT: No edema Neuro: Alert and oriented x3  PPS: 60%   This conversation/these recommendations were discussed with patient primary care team, Dr. FAileen Fass Billing based on MDM: High ______________________________________________________ MTacey Ruiz  [place] Team Team Cell Phone: [phone] Please utilize secure chat with additional questions, if there is no response within 30 minutes please call the above phone number  Palliative Medicine Team providers are available by phone from 7am to 7pm daily and can be reached through the team cell phone.  Should this patient require assistance outside of these hours, please call the patient's attending physician.

## 2022-03-24 NOTE — Progress Notes (Signed)
Progress Note   Subjective  Chief Complaint: Upper GI bleed, decompensated alcoholic cirrhosis with ascites and history of SBP, coagulopathy and history of hepatic encephalopathy and HRS as well as esophageal variceal bleed  Today, patient tells me that her stomach seems to be more more distended and is growing uncomfortable for her.  She would like to have a paracentesis if possible.  Denies any further bleeding at the moment.   Objective   Vital signs in last 24 hours: Temp:  [98 F (36.7 C)-98.4 F (36.9 C)] 98.4 F (36.9 C) (02/25 0849) Pulse Rate:  [81-96] 95 (02/25 0849) Resp:  [16-19] 19 (02/25 0849) BP: (109-140)/(67-83) 140/83 (02/25 0849) SpO2:  [97 %-100 %] 98 % (02/25 0849) Last BM Date : 03/22/22 General:    Chronically ill-appearing white female in NAD Heart: Tachycardic; no murmurs Lungs: Respirations even and unlabored, lungs CTA bilaterally Abdomen: Protuberant but soft, nontender and nondistended. Normal bowel sounds.+ Reducible umbilical hernia Neurologic: +asterixis Psych:  Cooperative. Normal mood and affect.  Intake/Output from previous day: 02/24 0701 - 02/25 0700 In: 218.6 [I.V.:218.6] Out: -  Intake/Output this shift: Total I/O In: 240 [P.O.:240] Out: -   Lab Results: Recent Labs    03/22/22 0719 03/23/22 0131 03/24/22 0236  WBC 13.8* 10.4 8.3  HGB 10.2* 8.3* 8.1*  HCT 29.0* 24.2* 25.0*  PLT 241 217 194   BMET Recent Labs    03/22/22 0719 03/23/22 0131 03/24/22 0236  NA 132* 129* 131*  K 4.7 4.2 3.4*  CL 104 102 104  CO2 18* 17* 19*  GLUCOSE 111* 159* 115*  BUN 31* 28* 16  CREATININE 1.08* 1.13* 0.88  CALCIUM 8.1* 8.0* 8.0*   LFT Recent Labs    03/24/22 0236  PROT 4.6*  ALBUMIN 2.5*  AST 52*  ALT 37  ALKPHOS 91  BILITOT 2.8*   PT/INR Recent Labs    03/23/22 0131 03/24/22 0236  LABPROT 21.3* 20.4*  INR 1.9* 1.8*    Assessment / Plan:   Assessment: 1.  Decompensated alcoholic cirrhosis with ascites and  history of SBP: Coagulopathy, recent hospitalization for hepatic encephalopathy and HRS admitted with esophageal variceal bleed treated with band ligation 03/21/2022, MELD 3.0 25 2.  Chronic iron deficiency anemia: Requiring both IV iron and PRBCs prior to this hospitalization 3.  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  Melena: Several melenic stools and a decline in hemoglobin, though has remained stable over the past 48 hours 5.  Hyponatremia  Plan: 1.  Continue serial hemoglobin/hematocrit with transfusions as indicated 2.  Complete 72 hours of Octreotide continuous infusion 3.  Continue PPI IV twice daily 4.  Continue empiric antibiotics for SBP coverage 5.  Consider TIPS if there is evidence of further ongoing overt GI bleeding, however she is at high risk for post TIPS worsening of her hepatic encephalopathy 6.  Continue clear liquid diet 7.  Given patient's adverse side effects from Nadolol in the past would recommend consideration for Carvedilol 6.25 mg daily for portal hypertension after completion of Octreotide if renal function remains stable 8.  Repeat EGD in 3 to 4 weeks for additional banding 9.  Formal alcohol counseling on discharge 10.  Outpatient transplant evaluation-has contacted [facility] recently 17.  Went ahead and ordered a therapeutic paracentesis today.  Also ordered 100 g of IV albumin 25%.  Thank you for your kind consultation, we will continue to follow.   LOS: 3 days   Erica Banks  03/24/2022, 2:07  PM

## 2022-03-25 ENCOUNTER — Inpatient Hospital Stay (HOSPITAL_COMMUNITY): Payer: Medicaid Other

## 2022-03-25 DIAGNOSIS — K7682 Hepatic encephalopathy: Secondary | ICD-10-CM

## 2022-03-25 DIAGNOSIS — K7030 Alcoholic cirrhosis of liver without ascites: Secondary | ICD-10-CM

## 2022-03-25 DIAGNOSIS — F10988 Alcohol use, unspecified with other alcohol-induced disorder: Secondary | ICD-10-CM

## 2022-03-25 DIAGNOSIS — K766 Portal hypertension: Secondary | ICD-10-CM

## 2022-03-25 DIAGNOSIS — I8511 Secondary esophageal varices with bleeding: Secondary | ICD-10-CM | POA: Diagnosis not present

## 2022-03-25 HISTORY — PX: IR PARACENTESIS: IMG2679

## 2022-03-25 LAB — COMPREHENSIVE METABOLIC PANEL
ALT: 31 U/L (ref 0–44)
AST: 42 U/L — ABNORMAL HIGH (ref 15–41)
Albumin: 2.9 g/dL — ABNORMAL LOW (ref 3.5–5.0)
Alkaline Phosphatase: 80 U/L (ref 38–126)
Anion gap: 6 (ref 5–15)
BUN: 10 mg/dL (ref 6–20)
CO2: 20 mmol/L — ABNORMAL LOW (ref 22–32)
Calcium: 8.1 mg/dL — ABNORMAL LOW (ref 8.9–10.3)
Chloride: 104 mmol/L (ref 98–111)
Creatinine, Ser: 0.81 mg/dL (ref 0.44–1.00)
GFR, Estimated: >60 mL/min (ref >60)
Glucose, Bld: 132 mg/dL — ABNORMAL HIGH (ref 70–99)
Potassium: 4.2 mmol/L (ref 3.5–5.1)
Sodium: 130 mmol/L — ABNORMAL LOW (ref 135–145)
Total Bilirubin: 1.9 mg/dL — ABNORMAL HIGH (ref 0.3–1.2)
Total Protein: 4.9 g/dL — ABNORMAL LOW (ref 6.5–8.1)

## 2022-03-25 LAB — GLUCOSE, CAPILLARY
Glucose-Capillary: 110 mg/dL — ABNORMAL HIGH (ref 70–99)
Glucose-Capillary: 140 mg/dL — ABNORMAL HIGH (ref 70–99)
Glucose-Capillary: 112 mg/dL — ABNORMAL HIGH (ref 70–99)
Glucose-Capillary: 151 mg/dL — ABNORMAL HIGH (ref 70–99)

## 2022-03-25 LAB — CBC
HCT: 24.2 % — ABNORMAL LOW (ref 36.0–46.0)
Hemoglobin: 7.6 g/dL — ABNORMAL LOW (ref 12.0–15.0)
MCH: 29.1 pg (ref 26.0–34.0)
MCHC: 31.4 g/dL (ref 30.0–36.0)
MCV: 92.7 fL (ref 80.0–100.0)
Platelets: 154 10*3/uL (ref 150–400)
RBC: 2.61 MIL/uL — ABNORMAL LOW (ref 3.87–5.11)
RDW: 18.7 % — ABNORMAL HIGH (ref 11.5–15.5)
WBC: 6.2 10*3/uL (ref 4.0–10.5)
nRBC: 0 % (ref 0.0–0.2)

## 2022-03-25 LAB — PROTIME-INR
INR: 2 — ABNORMAL HIGH (ref 0.8–1.2)
Prothrombin Time: 22.8 seconds — ABNORMAL HIGH (ref 11.4–15.2)

## 2022-03-25 MED ORDER — LIDOCAINE HCL 1 % IJ SOLN
INTRAMUSCULAR | Status: AC
  Administered 2022-03-25: 10 mL
  Filled 2022-03-25: qty 20

## 2022-03-25 MED ORDER — FUROSEMIDE 40 MG PO TABS: 40.0000 mg | ORAL_TABLET | Freq: Two times a day (BID) | ORAL | Status: DC

## 2022-03-25 MED ORDER — FUROSEMIDE 40 MG PO TABS
40.0000 mg | ORAL_TABLET | Freq: Two times a day (BID) | ORAL | Status: DC
  Administered 2022-03-26: 40 mg via ORAL
  Filled 2022-03-25: qty 1

## 2022-03-25 MED ORDER — FUROSEMIDE 40 MG PO TABS
40.0000 mg | ORAL_TABLET | Freq: Every day | ORAL | Status: DC
  Administered 2022-03-25: 40 mg via ORAL
  Filled 2022-03-25: qty 1

## 2022-03-25 MED ORDER — SPIRONOLACTONE 25 MG PO TABS
50.0000 mg | ORAL_TABLET | Freq: Every day | ORAL | Status: DC
  Administered 2022-03-26: 50 mg via ORAL
  Filled 2022-03-25: qty 2

## 2022-03-25 MED ORDER — NADOLOL 20 MG PO TABS
20.0000 mg | ORAL_TABLET | Freq: Every day | ORAL | Status: DC
  Administered 2022-03-26: 20 mg via ORAL
  Filled 2022-03-25: qty 1

## 2022-03-25 NOTE — Procedures (Signed)
PROCEDURE SUMMARY:  Successful ultrasound guided paracentesis from the left lower quadrant.  Yielded 8.5 L of straw colored fluid.  No immediate complications.  The patient tolerated the procedure well.    EBL < 54m  The [facility] and is being actively followed for potential future intervention.

## 2022-03-25 NOTE — Progress Notes (Signed)
Patient set up on automatic trays

## 2022-03-25 NOTE — Progress Notes (Addendum)
Patient ID: Erica Banks, female   DOB: 06-30-1980, 42 y.o.   MRN: [id number]    Progress Note   Subjective   Day # 5  CC; GI bleed, decompensated EtOH induced cirrhosis, encephalopathy  Rocephin   EGD 03/21/2022-3 columns of grade 2 varices, 1 with stigmata of active bleeding, banded x 5, portal hypertensive gastropathy  Paracentesis today just done -8.5 liters  Pro time 22.8/INR 2.0 Sodium 130/potassium 4.3/BUN 10/creatinine 0.8  Albumin 2.9 T. bili 1.9/AST 42/ALT 31 alk phos 80  Current MELD-Na= 22  Patient says she feels much better since the paracentesis, able to move around more comfortably, no complaints of abdominal pain, hungry and asking for solid food, no nausea or vomiting, no melena Patient states that her sister has been working on transplant evaluation for her through [place].  Patient says that her sister was able to communicate with someone they are that they were told she should come to [facility] after she is discharged.   Objective   Vital signs in last 24 hours: Temp:  [98.4 F (36.9 C)-98.6 F (37 C)] 98.4 F (36.9 C) (02/26 0822) Pulse Rate:  [83-92] 92 (02/26 0822) Resp:  [16-19] 16 (02/26 0822) BP: (117-132)/(75-82) 132/82 (02/26 0822) SpO2:  [95 %-100 %] 100 % (02/26 0822) Last BM Date : 03/24/22 General:   Chronically ill-appearing white female in NAD Heart:  Regular rate and rhythm; no murmurs Lungs: Respirations even and unlabored, lungs CTA bilaterally Abdomen:  Soft, nontense ascites normal bowel sounds.  Protuberant umbilical hernia Extremities:  Without edema. Neurologic:  Alert and oriented,  grossly normal neurologically.  No asterixis Psych:  Cooperative. Normal mood and affect.  Intake/Output from previous day: 02/25 0701 - 02/26 0700 In: 733.9 [P.O.:720; IV Piggyback:13.9] Out: -  Intake/Output this shift: No intake/output data recorded.  Lab Results: Recent Labs    03/23/22 0131 03/24/22 0236 03/25/22 0228  WBC 10.4 8.3 6.2   HGB 8.3* 8.1* 7.6*  HCT 24.2* 25.0* 24.2*  PLT 217 194 154   BMET Recent Labs    03/23/22 0131 03/24/22 0236 03/25/22 0228  NA 129* 131* 130*  K 4.2 3.4* 4.2  CL 102 104 104  CO2 17* 19* 20*  GLUCOSE 159* 115* 132*  BUN 28* 16 10  CREATININE 1.13* 0.88 0.81  CALCIUM 8.0* 8.0* 8.1*   LFT Recent Labs    03/25/22 0228  PROT 4.9*  ALBUMIN 2.9*  AST 42*  ALT 31  ALKPHOS 80  BILITOT 1.9*   PT/INR Recent Labs    03/24/22 0236 03/25/22 0228  LABPROT 20.4* 22.8*  INR 1.8* 2.0*    Studies/Results: No results found.     Assessment / Plan:     #30 42 year old white female with decompensated EtOH induced cirrhosis with ascites, prior history of SBP, coagulopathy, history of hepatic encephalopathy and previous HRS.  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 daily and Aldactone 50 mg daily  Consider starting carvedilol 6.25 mg daily  If labs are stable tomorrow, she should be okay for discharge.  She needs repeat EGD in 3 to 4 weeks, had previously been established with Dr. Gala Romney in [place] and she will follow-up there.  Patient relates that her sister has been working on outpatient transplant evaluation through [place] I do not see any notes in the chart but patient states they will plan to take to [facility] after she is discharged.       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

## 2022-03-25 NOTE — TOC Initial Note (Addendum)
Transition of Care (TOC) - Initial/Assessment Note   Discussed OP Palliative care services with patient. Patient declined at present. NCM explained if she changes her mind prior to discharge to let nurse/MD know.If she changes her mind after discharge PCP can arrange.   Spoke to patient at bedside. Patient from home with husband. Is going to [facility] for OP PT and would like to continue. Order entered and asked MD to sign.   PT recommending Rollator. Spoke to IM with [employer] on speaker phone in patient's room. Patient received a cane in January 2024 therefore Medicaid will not cover Rollator for 5 years . Patient can private pay for Rollator, patient declined to find out cost.   Patient received a stool chair last admission also. Patient states shower chair is to low to the ground . Kim explained legs of shower chair are adjustable.    Patient Details  Name: Erica Banks MRN: [id number] Date of Birth: [birth year]  Transition of Care [facility] [place]) CM/SW Contact:    Marilu Favre, RN Phone Number: 03/25/2022, 1:04 PM  Clinical Narrative:                   Expected Discharge Plan: [place] Barriers to Discharge: Continued Medical Work up   Patient Goals and CMS Choice Patient states their goals for this hospitalization and ongoing recovery are:: to return to home     [place] ownership interest in [facility].provided to:: Patient    Expected Discharge Plan and Services   Discharge Planning Services: CM Consult Post Acute Care Choice:  (OP PT) Living arrangements for the past 2 months: Single Family Home                 DME Arranged:  (see note)         HH Arranged: NA          Prior Living Arrangements/Services Living arrangements for the past 2 months: Single Family Home Lives with:: Spouse Patient language and need for interpreter reviewed:: Yes Do you feel safe going back to the place where you live?: Yes      Need for Family  Participation in Patient Care: Yes (Comment) Care giver support system in place?: Yes (comment) Current home services: DME Criminal Activity/Legal Involvement Pertinent to Current Situation/Hospitalization: No - Comment as needed  Activities of Daily Living      Permission Sought/Granted   Permission granted to share information with : No              Emotional Assessment Appearance:: Appears stated age Attitude/Demeanor/Rapport: Engaged Affect (typically observed): Accepting Orientation: : Oriented to Self, Oriented to Place, Oriented to  Time, Oriented to Situation Alcohol / Substance Use: Not Applicable Psych Involvement: No (comment)  Admission diagnosis:  UGI bleed [K92.2] Esophageal bleed, non-variceal [K22.89] Patient Active Problem List   Diagnosis Date Noted   Portal hypertension ([place]) 03/23/2022   Encephalopathy, hepatic ([place]) 03/23/2022   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 ([place]) 03/21/2022   Urinary tract infection due to ESBL Klebsiella 03/08/2022   Protein-calorie malnutrition, severe 03/08/2022   Decompensated hepatic cirrhosis ([place]) 03/04/2022   Acute respiratory failure with hypoxemia ([place]) 03/01/2022   Hepatic encephalopathy ([place]) 03/01/2022   Transaminitis 02/14/2022   Abdominal pain 02/14/2022   Acute hepatic encephalopathy ([place]) 02/10/2022   Hemorrhoids 02/10/2022   Generalized weakness 12/19/2021   AKI (acute kidney injury) ([place]) 12/19/2021   Hyperammonemia ([place]) 12/19/2021   Hypomagnesemia  12/19/2021   Ascites due to alcoholic cirrhosis ([place]) [id number]   Hypocalcemia 11/23/2021   Hypoalbuminemia 11/23/2021   Tobacco use disorder 11/23/2021   Esophageal varices without bleeding ([place]) 10/31/2021   Heartburn 08/23/2021   Nausea without vomiting 08/23/2021   Lesion of liver less than 1 cm in diameter    Acute blood loss anemia [id number]   Alcoholic cirrhosis of liver with ascites ([place]) 03/27/2021   Hyponatremia  03/27/2021   Iron deficiency anemia 03/27/2021   Folate deficiency 03/27/2021   Ascites    Cirrhosis of liver with ascites ([place])    Intractable nausea and vomiting 07/20/2019   Cholelithiasis 07/20/2019   Prolonged QT interval 07/20/2019   Anemia 01/01/2015   Symptomatic anemia 01/01/2015   Hypokalemia 01/01/2015   Acute gastroenteritis 01/01/2015   Abnormal CT of the abdomen 06/08/2013   Gastroesophageal reflux disease 06/08/2013   PCP:  Neale Burly, MD Pharmacy:   [place], [place] [id number] [place] [id number] Phone: [phone] Fax: [phone]  [facility] [address] [place] [address] Phone: [phone] Fax: [phone]     Social Determinants of Health (SDOH) Social History: [place]: No Food Insecurity (03/08/2022)  Housing: Low Risk  (03/08/2022)  Transportation Needs: No Transportation Needs (03/08/2022)  Utilities: Not At Risk (03/08/2022)  Tobacco Use: High Risk (03/24/2022)   SDOH Interventions:     Readmission Risk Interventions    02/14/2022    2:11 PM 12/19/2021    8:54 AM  Readmission Risk Prevention Plan  Transportation Screening Complete Complete  HRI or Home Care Consult Complete Complete  Social Work Consult for [place] Planning/Counseling Complete Complete  Palliative Care Screening Not Applicable Not Applicable  Medication Review Press photographer) Complete Complete

## 2022-03-25 NOTE — Progress Notes (Signed)
Dinner tray ordered (2gram sodium diet)

## 2022-03-25 NOTE — Progress Notes (Signed)
Palliative:  [name] is in consultation with GI at time of my visit so I allowed them time to discuss and visit. I was called to see another patient so I did not visit with [name] today. If [name] discharges prior to palliative follow up we will be happy to visit with her again in the future.   No charge  [name], NP Palliative Medicine Team Pager [phone] (Please see [email or web address] for schedule) Team Phone [phone]

## 2022-03-25 NOTE — Progress Notes (Signed)
[facility] PROGRESS NOTE    Progress Note  Erica MAZZUCA  [id number] DOB: 1980/07/11 DOA: 03/20/2022 PCP: Neale Burly, MD     Brief Narrative:   Erica Banks is an 42 y.o. female past medical history of alcoholic cirrhosis with ascites, encephalopathy comes in for nausea vomiting and hematemesis urgently taken to the EGD which required banding of esophageal varices left on the vent PCCM manage, extubated on 03/22/2022 sedation discontinued tolerating her liquid diet was resumed on lactulose and rifaximin the patient and transferred to [facility] on 03/24/2022, she was started on empiric prophylactic antibiotics for SBP she will complete her treatment in house.  Assessment/Plan:   Acute upper GI bleed likely due to  Esophageal varices status post banding x 5: She has completed course of antibiotics in house. GI on board recommended 72 hours with IVF to try continue IV PPI and 5 days of antibiotics. If she rebleeds may need TIPS. EGD in 3 to 4 weeks. Start low-dose Aldactone and Lasix continue to monitor electrolytes daily. Palliative care met with the patient very low eval motion. Creatinine has remained stable currently on Lasix and Aldactone. GI recommended paracentesis by IR with IV albumin.  Portal hypertension (HCC)/decompensated alcoholic hepatic cirrhosis With a recent esophageal bleed, does not tolerate nadolol will discuss with GI. IR was consulted for paracentesis will give albumin. Currently on Aldactone and Lasix creatinine has remained stable.  Encephalopathy, hepatic (HCC) Continue rifaximin and lactulose mentation is improved.   DVT prophylaxis: scd Family Communication:none Status is: Inpatient Remains inpatient appropriate because: Esophageal bleed.    Code Status:     Code Status Orders  (From admission, onward)           Start     Ordered   03/21/22 0723  Full code  Continuous       Question:  By:  Answer:  Other   03/21/22 0724            Code Status History     Date Active Date Inactive Code Status Order ID Comments User Context   03/01/2022 2119 03/18/2022 2028 Full Code [id number]  Marcy Panning Inpatient   02/13/2022 2058 02/16/2022 1807 Full Code [id number]  Rolla Plate, DO ED   02/10/2022 1933 02/11/2022 2042 Full Code [id number]  Rolla Plate, DO ED   12/19/2021 0043 12/22/2021 1942 Full Code [id number]  Bernadette Hoit, DO Inpatient   11/23/2021 0445 11/24/2021 2112 Full Code [id number]  [place], [place], DO Inpatient   11/23/2021 0403 11/23/2021 0445 DNR [id number]  Rolla Plate, DO Inpatient   03/27/2021 0922 03/30/2021 2208 Full Code [id number]  Orson Eva, MD ED   07/20/2019 0714 07/20/2019 2352 Full Code [id number]  Reubin Milan, MD ED   01/01/2015 2309 01/02/2015 2131 Full Code [id number]  Theressa Millard, MD Inpatient   03/05/2012 1412 03/05/2012 2253 Full Code [id number]  Sharyon Cable, MD ED         IV Access:   Peripheral IV   Procedures and diagnostic studies:   No results found.   Medical Consultants:   None.   Subjective:    Erica Banks tolerating her diet she really wants to go home.  Objective:    Vitals:   03/24/22 0518 03/24/22 0849 03/24/22 1637 03/24/22 2201  BP: 109/67 (!) 140/83 117/75 118/80  Pulse: 85 95 91 83  Resp: '16 19 18 19  '$ Temp:  98.4 F (36.9 C) 98.5 F (36.9 C)  98.6 F (37 C)  TempSrc:   Oral Oral  SpO2: 97% 98% 97% 95%  Weight:      Height:       SpO2: 95 % O2 Flow Rate (L/min): 2 L/min FiO2 (%): 40 %   Intake/Output Summary (Last 24 hours) at 03/25/2022 0800 Last data filed at 03/25/2022 0600 Gross per 24 hour  Intake 733.87 ml  Output --  Net 733.87 ml    Filed Weights   03/20/22 2253 03/21/22 1537 03/22/22 0453  Weight: 68 kg 68 kg 69.4 kg    Exam: General exam: In no acute distress. Respiratory system: Good air movement and clear to auscultation. Cardiovascular system: S1 & S2 heard, RRR.  No JVD. Gastrointestinal system: Abdomen is distended, soft and nontender.  Extremities: No pedal edema. Skin: No rashes, lesions or ulcers Psychiatry: Judgement and insight appear normal. Mood & affect appropriate.  Data Reviewed:    Labs: Basic Metabolic Panel: Recent Labs  Lab 03/21/22 1559 03/21/22 1720 03/21/22 1913 03/22/22 0719 03/23/22 0131 03/24/22 0236 03/25/22 0228  NA 135 134*  --  132* 129* 131* 130*  K 4.5 4.5   < > 4.7 4.2 3.4* 4.2  CL 105  --   --  104 102 104 104  CO2 21*  --   --  18* 17* 19* 20*  GLUCOSE 145*  --   --  111* 159* 115* 132*  BUN 20  --   --  31* 28* 16 10  CREATININE 0.98  --   --  1.08* 1.13* 0.88 0.81  CALCIUM 7.9*  --   --  8.1* 8.0* 8.0* 8.1*  MG 1.4*  --   --  2.5*  --   --   --   PHOS  --   --   --  5.2*  --   --   --    < > = values in this interval not displayed.    GFR Estimated Creatinine Clearance: 86.4 mL/min (by C-G formula based on SCr of 0.81 mg/dL). Liver Function Tests: Recent Labs  Lab 03/20/22 2330 03/22/22 0719 03/23/22 0131 03/24/22 0236 03/25/22 0228  AST 43* 57* 46* 52* 42*  ALT 35 38 36 37 31  ALKPHOS 74 83 76 91 80  BILITOT 2.6* 2.6* 2.7* 2.8* 1.9*  PROT 4.4* 4.9* 4.6* 4.6* 4.9*  ALBUMIN 2.4* 2.7* 2.4* 2.5* 2.9*    Recent Labs  Lab 03/20/22 2330  LIPASE 84*    Recent Labs  Lab 03/20/22 2330 03/22/22 0719  AMMONIA 34 175*    Coagulation profile Recent Labs  Lab 03/20/22 2330 03/22/22 0719 03/23/22 0131 03/24/22 0236 03/25/22 0228  INR 2.4* 1.9* 1.9* 1.8* 2.0*    COVID-19 Labs  No results for input(s): "DDIMER", "FERRITIN", "LDH", "CRP" in the last 72 hours.  Lab Results  Component Value Date   SARSCOV2NAA NEGATIVE 12/18/2021   SARSCOV2NAA NEGATIVE 03/27/2021   [place] NEGATIVE 08/18/2020   [place] NEGATIVE 07/20/2019    CBC: Recent Labs  Lab 03/20/22 2330 03/21/22 0654 03/22/22 0029 03/22/22 0339 03/22/22 0707 03/22/22 0719 03/23/22 0131 03/24/22 0236  03/25/22 0228  WBC 10.4   < > 14.8*  --   --  13.8* 10.4 8.3 6.2  NEUTROABS 6.1  --   --   --   --   --   --   --   --   HGB 6.1*  6.1*   < > 9.9*   < > 10.3* 10.2* 8.3* 8.1* 7.6*  HCT 19.3*  18.0*   < > 30.1*   < > 29.4* 29.0* 24.2* 25.0* 24.2*  MCV 95.1   < > 88.3  --   --  87.9 86.4 89.3 92.7  PLT 242   < > 189  --   --  241 217 194 154   < > = values in this interval not displayed.    Cardiac Enzymes: No results for input(s): "CKTOTAL", "CKMB", "CKMBINDEX", "TROPONINI" in the last 168 hours. BNP (last 3 results) No results for input(s): "PROBNP" in the last 8760 hours. CBG: Recent Labs  Lab 03/23/22 2140 03/24/22 0849 03/24/22 1149 03/24/22 1639 03/24/22 2320  GLUCAP 116* 114* 156* 146* 123*    D-Dimer: No results for input(s): "DDIMER" in the last 72 hours. Hgb A1c: No results for input(s): "HGBA1C" in the last 72 hours.  Lipid Profile: No results for input(s): "CHOL", "HDL", "LDLCALC", "TRIG", "CHOLHDL", "LDLDIRECT" in the last 72 hours.  Thyroid function studies: No results for input(s): "TSH", "T4TOTAL", "T3FREE", "THYROIDAB" in the last 72 hours.  Invalid input(s): "FREET3" Anemia work up: No results for input(s): "VITAMINB12", "FOLATE", "FERRITIN", "TIBC", "IRON", "RETICCTPCT" in the last 72 hours. Sepsis Labs: Recent Labs  Lab 03/22/22 0719 03/23/22 0131 03/24/22 0236 03/25/22 0228  WBC 13.8* 10.4 8.3 6.2    Microbiology Recent Results (from the past 240 hour(s))  MRSA Next Gen by PCR, Nasal     Status: None   Collection Time: 03/21/22  3:19 PM   Specimen: Nasal Mucosa; Nasal Swab  Result Value Ref Range Status   MRSA by PCR Next Gen NOT DETECTED NOT DETECTED Final    Comment: (NOTE) The GeneXpert MRSA Assay (FDA approved for NASAL specimens only), is one component of a comprehensive MRSA colonization surveillance program. It is not intended to diagnose MRSA infection nor to guide or monitor treatment for MRSA infections. Test performance is  not FDA approved in patients less than 70 years old. Performed at [facility], [place] [address] [place], [place] [address]      Medications:    Chlorhexidine Gluconate Cloth  6 each Topical Daily   furosemide  40 mg Oral Daily   insulin aspart  0-5 Units Subcutaneous QHS   insulin aspart  0-6 Units Subcutaneous TID WC   lactulose  10 g Oral TID   pantoprazole  40 mg Intravenous Q12H   rifaximin  550 mg Oral Daily   sodium chloride flush  3 mL Intravenous Q12H   spironolactone  25 mg Oral Daily   thiamine  100 mg Oral Daily   Continuous Infusions:  sodium chloride     octreotide (SANDOSTATIN) 500 mcg in sodium chloride 0.9 % 250 mL (2 mcg/mL) infusion 50 mcg/hr (03/23/22 1549)      LOS: 4 days   Charlynne Cousins  [facility]  03/25/2022, 8:00 AM

## 2022-03-25 NOTE — Progress Notes (Signed)
This chaplain responded to PMT NP-Michelle consult for creating/updating the Pt. Advance Directive.   The Pt. is awake and willing to participate in AD education. The chaplain understands the Pt. husband-Erica Banks is her surrogate decision maker by [place] [employer]. The Pt. desires her daughter-Erica Banks be included in medical discussions. The chaplain requested RN update Patient Contacts to include daughter's contact information. The Pt. declined creating an Advance Directive.  The chaplain understands the Pt. goal is to return home. The chaplain checked in with the Pt. RN-Jasmin before bringing the Pt. 2 Sprites. This chaplain is available for F/U spiritual care as needed.  Chaplain Erica Banks [phone]

## 2022-03-26 ENCOUNTER — Other Ambulatory Visit (HOSPITAL_COMMUNITY): Payer: Self-pay

## 2022-03-26 DIAGNOSIS — K922 Gastrointestinal hemorrhage, unspecified: Secondary | ICD-10-CM

## 2022-03-26 DIAGNOSIS — K7682 Hepatic encephalopathy: Secondary | ICD-10-CM

## 2022-03-26 DIAGNOSIS — I8511 Secondary esophageal varices with bleeding: Secondary | ICD-10-CM

## 2022-03-26 DIAGNOSIS — K766 Portal hypertension: Secondary | ICD-10-CM

## 2022-03-26 LAB — CBC WITH DIFFERENTIAL/PLATELET
Abs Immature Granulocytes: 0.04 10*3/uL (ref 0.00–0.07)
Basophils Absolute: 0.1 10*3/uL (ref 0.0–0.1)
Basophils Relative: 1 %
Eosinophils Absolute: 0.3 10*3/uL (ref 0.0–0.5)
Eosinophils Relative: 3 %
HCT: 26 % — ABNORMAL LOW (ref 36.0–46.0)
Hemoglobin: 8.6 g/dL — ABNORMAL LOW (ref 12.0–15.0)
Immature Granulocytes: 1 %
Lymphocytes Relative: 37 %
Lymphs Abs: 3.1 10*3/uL (ref 0.7–4.0)
MCH: 29.6 pg (ref 26.0–34.0)
MCHC: 33.1 g/dL (ref 30.0–36.0)
MCV: 89.3 fL (ref 80.0–100.0)
Monocytes Absolute: 0.8 10*3/uL (ref 0.1–1.0)
Monocytes Relative: 10 %
Neutro Abs: 4 10*3/uL (ref 1.7–7.7)
Neutrophils Relative %: 48 %
Platelets: 165 10*3/uL (ref 150–400)
RBC: 2.91 MIL/uL — ABNORMAL LOW (ref 3.87–5.11)
RDW: 17.8 % — ABNORMAL HIGH (ref 11.5–15.5)
WBC: 8.2 10*3/uL (ref 4.0–10.5)
nRBC: 0 % (ref 0.0–0.2)

## 2022-03-26 LAB — BASIC METABOLIC PANEL
Anion gap: 10 (ref 5–15)
BUN: 6 mg/dL (ref 6–20)
CO2: 21 mmol/L — ABNORMAL LOW (ref 22–32)
Calcium: 7.9 mg/dL — ABNORMAL LOW (ref 8.9–10.3)
Chloride: 97 mmol/L — ABNORMAL LOW (ref 98–111)
Creatinine, Ser: 0.83 mg/dL (ref 0.44–1.00)
GFR, Estimated: >60 mL/min (ref >60)
Glucose, Bld: 139 mg/dL — ABNORMAL HIGH (ref 70–99)
Potassium: 3.6 mmol/L (ref 3.5–5.1)
Sodium: 128 mmol/L — ABNORMAL LOW (ref 135–145)

## 2022-03-26 LAB — PROTIME-INR
INR: 1.9 — ABNORMAL HIGH (ref 0.8–1.2)
Prothrombin Time: 21.9 seconds — ABNORMAL HIGH (ref 11.4–15.2)

## 2022-03-26 LAB — GLUCOSE, CAPILLARY: Glucose-Capillary: 119 mg/dL — ABNORMAL HIGH (ref 70–99)

## 2022-03-26 MED ORDER — NADOLOL 20 MG PO TABS
10.0000 mg | ORAL_TABLET | Freq: Every day | ORAL | 0 refills | Status: DC
  Filled 2022-03-26: qty 30, 60d supply, fill #0

## 2022-03-26 NOTE — Discharge Summary (Signed)
Physician Discharge Summary  Erica Banks [id number] DOB: [birth year] DOA: 03/20/2022  PCP: Neale Burly, MD  Admit date: 03/20/2022 Discharge date: 03/26/2022  Admitted From: Home Disposition:  Home  Recommendations for Outpatient Follow-up:  Follow up with PCP in 1-2 weeks Please obtain BMP/CBC in one week   Home Health:No Equipment/Devices:None  Discharge Condition:Stable CODE STATUS:Full Diet recommendation: Heart Healthy   Brief/Interim Summary: 42 y.o. female past medical history of alcoholic cirrhosis with ascites, encephalopathy comes in for nausea vomiting and hematemesis urgently taken to the EGD which required banding of esophageal varices left on the vent PCCM manage, extubated on 03/22/2022 sedation discontinued tolerating her liquid diet was resumed on lactulose and rifaximin the patient and transferred to [facility] on 03/24/2022, she was started on empiric prophylactic antibiotics for SBP she will complete her treatment in house.   Discharge Diagnoses:  Principal Problem:   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 [facility]) Active Problems:   Portal hypertension (HCC)   Encephalopathy, hepatic (HCC)  Acute upper GI bleed likely due to esophageal varices status post banding x 5/portal hypertension/decompensated alcoholic cirrhosis: She came in with an upper GI bleed, GI was consulted she was intubated and emergently EGD was performed was found to have esophageal varices bleeding with banding. On admission she was also started on octreotide we will continue for 72 hours. She was also started on Protonix which she will continue as an outpatient. GI recommended EGD in 3 to 4 weeks. She was restarted back on Lasix and Aldactone her electrolytes remained stable. GI also recommended paracentesis which was performed on 07/24/2022 and she was also given IV albumin. Her creatinine remained stable after paracentesis while on Lasix and  Aldactone.  Hepatic encephalopathy: She was restarted back on her rifaximin and lactulose her mentation improved.  Discharge Instructions  Discharge Instructions     Ambulatory referral to Physical Therapy   Complete by: As directed    Iontophoresis - 4 mg/ml of dexamethasone: No   T.E.N.S. Unit Evaluation and Dispense as Indicated: No   Diet - low sodium heart healthy   Complete by: As directed    Increase activity slowly   Complete by: As directed    No wound care   Complete by: As directed       Allergies as of 03/26/2022       Reactions   Demerol Anaphylaxis   Per patient cardiac arrest        Medication List     TAKE these medications    acetaminophen 500 MG tablet Commonly known as: TYLENOL Take 1,000 mg by mouth daily as needed for moderate pain.   CertaVite/Antioxidants Tabs Take 1 tablet by mouth daily.   ciprofloxacin 500 MG tablet Commonly known as: CIPRO Take 1 tablet (500 mg total) by mouth daily with breakfast.   Constulose 10 GM/15ML solution Generic drug: lactulose Take 15 mLs (10 g total) by mouth 3 (three) times daily.   dronabinol 2.5 MG capsule Commonly known as: MARINOL Take 1 capsule (2.5 mg total) by mouth 2 (two) times daily before lunch and supper. What changed: when to take this   folic acid 1 MG tablet Commonly known as: FOLVITE Take 1 tablet (1 mg total) by mouth daily.   furosemide 40 MG tablet Commonly known as: LASIX Take 1 tablet (40 mg total) by mouth 2 (two) times daily.   gabapentin 300 MG capsule Commonly known as: NEURONTIN Take 300 mg by mouth 3 (three) times daily.  nadolol 20 MG tablet Commonly known as: CORGARD Take 0.5 tablets (10 mg total) by mouth daily.   pantoprazole 40 MG tablet Commonly known as: PROTONIX Take 1 tablet (40 mg total) by mouth 2 (two) times daily before a meal.   rifaximin 550 MG Tabs tablet Commonly known as: XIFAXAN Take 1 tablet (550 mg total) by mouth 2 (two) times  daily. What changed: when to take this   spironolactone 50 MG tablet Commonly known as: ALDACTONE Take 1 tablet (50 mg total) by mouth daily.   thiamine 100 MG tablet Commonly known as: VITAMIN B1 Take 1 tablet (100 mg total) by mouth daily.        Allergies  Allergen Reactions   Demerol Anaphylaxis    Per patient cardiac arrest    Consultations: Gastroenterology  Procedures/Studies: IR Paracentesis  Result Date: 03/25/2022 INDICATION: Patient with history of alcoholic cirrhosis with recurrent ascites. Patient presents for therapeutic paracentesis EXAM: ULTRASOUND GUIDED THERAPEUTIC PARACENTESIS MEDICATIONS: LIDOCAINE 1% 10 ML COMPLICATIONS: None immediate. PROCEDURE: Informed written consent was obtained from the patient after a discussion of the risks, benefits and alternatives to treatment. A timeout was performed prior to the initiation of the procedure. Initial ultrasound scanning demonstrates a large amount of ascites within the left lower abdominal quadrant. The left lower abdomen was prepped and draped in the usual sterile fashion. 1% lidocaine was used for local anesthesia. Following this, a 19 gauge, 7-cm, Yueh catheter was introduced. An ultrasound image was saved for documentation purposes. The paracentesis was performed. The catheter was removed and a dressing was applied. The patient tolerated the procedure well without immediate post procedural complication. Patient received post-procedure intravenous albumin; see nursing notes for details. FINDINGS: A total of approximately 8.5 L of straw-colored fluid was removed. IMPRESSION: Successful ultrasound-guided therapeutic paracentesis yielding 8.5 liters of peritoneal fluid. Read by: Rushie Nyhan, NP PLAN: The patient has previously been formally evaluated by the [facility] Interventional Radiology [facility] Hypertension [facility] and is being actively followed for potential future intervention. Electronically Signed   By: Aletta Edouard M.D.   On: 03/25/2022 14:59   DG Chest Port 1 View  Result Date: 03/22/2022 CLINICAL DATA:  [id number] with ventilator dependent respiratory failure. EXAM: PORTABLE CHEST 1 VIEW COMPARISON:  Portable chest yesterday at 3:24 p.m. FINDINGS: 4:50 a.m. ETT interval pullback to 2 cm from the carina, was previously only a few mm over it. The cardiomediastinal silhouette and vascular markings are normal. The lungs are expiratory obscuring the bases, with the visualized lungs clear. No acute osseous findings. Stable overall aeration. IMPRESSION: 1. ETT interval pullback to 2 cm from the carina. 2. Expiratory chest with the visualized lungs clear. The lung bases are not evaluated. Electronically Signed   By: Telford Nab M.D.   On: 03/22/2022 06:14   Portable Chest x-ray  Result Date: 03/21/2022 CLINICAL DATA:  Endotracheal tube placement EXAM: PORTABLE CHEST 1 VIEW COMPARISON:  03/10/2022 FINDINGS: An endotracheal tube has been placed with tip measuring about 9 mm above the carina. Previous enteric tube is been removed. Shallow inspiration with linear atelectasis in the lung bases. Heart size and pulmonary vascularity are normal for technique. No pleural effusions. No pneumothorax. IMPRESSION: Endotracheal tube placed with tip measuring about 9 mm above the carina. Shallow inspiration with atelectasis in both lung bases. Electronically Signed   By: Lucienne Capers M.D.   On: 03/21/2022 15:49   DG Swallowing Func-Speech Pathology  Result Date: 03/18/2022 Table formatting from the original result was not  included. Modified Barium Swallow Study Patient Details Name: CABELLA ANGELOFF MRN: [id number] Date of Birth: [birth year] Today's Date: 03/18/2022 HPI/PMH: HPI: Michaya Khalsa is a 42 yo F with Etoh Cirrhosis/ascites and h/o hep encephalopathy d/c from [facility] 1/29 to GI clinic 2/2 am to schedule paracentesis but woke up confused > referred from GI to ED. Lethargic, risk of aspiration and need for rx with  lactulose so electively intubated.  ETT 2/2-2/8. Per RD note husbad reports "pt has a very poor appetite at baseline. 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  Also with history of IDA following with hematology with prescription iron infusions, GERD, PE, DVT, neuropathy. Clinical Impression: Clinical Impression: Pt presents with much improved swallowing function. There was mild impairment in tongue base retraction, leading to mild vallecular residue of all consistencies, cleared spontaneously with a sub-swallow.  There was partial distention of flow through PES - it did not negatively impact function. There was one incident of transient penetration of thin liquids (PAS score of 2) when swallowing 13 mm pill with thin liquids. There was no aspiration. Recommend advancing liquids from nectars to thin; continue regular solids. No further SLP intervention is warranted. Our service will sign off. Factors that may increase risk of adverse event in presence of aspiration ([place] 2021): No data recorded Recommendations/Plan: Swallowing Evaluation Recommendations Swallowing Evaluation Recommendations Recommendations: PO diet PO Diet Recommendation: Regular; Thin liquids (Level 0) Liquid Administration via: Straw; Cup Medication Administration: Whole meds with liquid Supervision: Patient able to self-feed Caregiver Recommendations: Avoid jello, ice cream, thin soups, popsicles Treatment Plan Treatment Plan Follow-up recommendations: No SLP follow up Recommendations Recommendations for follow up therapy are one component of a multi-disciplinary discharge planning process, led by the attending physician.  Recommendations may be updated based on patient status, additional functional criteria and insurance  authorization. Assessment: Orofacial Exam: Orofacial Exam Oral Cavity: Oral Hygiene: WFL Oral Cavity - Dentition: Adequate natural dentition Orofacial Anatomy: WFL Anatomy: Anatomy: Suspected cervical osteophytes Thin Liquids: Thin Liquids (Level 0) Thin Liquids : Impaired Bolus delivery method: Spoon; Cup Thin Liquid - Impairment: Oral Impairment; Pharyngeal impairment Lip Closure: No labial escape Tongue control during bolus hold: Cohesive bolus between tongue to palatal seal Bolus transport/lingual motion: Brisk tongue motion Oral residue: Complete oral clearance Location of oral residue : N/A Initiation of swallow : Valleculae Soft palate elevation: No bolus between soft palate (SP)/pharyngeal wall (PW) Laryngeal elevation: Complete superior movement of thyroid cartilage with complete approximation of arytenoids to epiglottic petiole Anterior hyoid excursion: Complete Epiglottic movement: Complete Laryngeal vestibule closure: Complete, no air/contrast in laryngeal vestibule Pharyngeal stripping wave : Present - complete Pharyngeal contraction (A/P view only): N/A Pharyngoesophageal segment opening: Partial distention/partial duration, partial obstruction of flow Tongue base retraction: Trace column of contrast or air between tongue base and PPW Pharyngeal residue: Trace residue within or on pharyngeal structures Location of pharyngeal residue: Valleculae Penetration/Aspiration Scale (PAS) score: 1.  Material does not enter airway  Mildly Thick Liquids: Mildly thick liquids (Level 2, nectar thick) Mildly thick liquids (Level 2, nectar thick): Impaired Bolus delivery method: Spoon; Cup Mildly Thick Liquid - Impairment: Oral Impairment; Pharyngeal impairment Lip Closure: No labial escape Tongue control during bolus hold: Cohesive bolus between tongue to palatal seal Bolus transport/lingual motion: Brisk tongue motion Oral residue: Complete oral clearance  Location of oral residue : N/A Initiation of swallow :  Posterior angle of the ramus Soft palate elevation: No bolus between soft palate (SP)/pharyngeal wall (PW) Laryngeal elevation: Complete superior movement of thyroid cartilage with complete approximation of arytenoids to epiglottic petiole Anterior hyoid excursion: Complete Epiglottic movement: Complete Laryngeal vestibule closure: Complete, no air/contrast in laryngeal vestibule Pharyngeal stripping wave : Present - complete Pharyngeal contraction (A/P view only): N/A Pharyngoesophageal segment opening: Partial distention/partial duration, partial obstruction of flow Tongue base retraction: Trace column of contrast or air between tongue base and PPW Pharyngeal residue: Trace residue within or on pharyngeal structures Location of pharyngeal residue: Valleculae Penetration/Aspiration Scale (PAS) score: 1.  Material does not enter airway  Moderately Thick Liquids: Moderately thick liquids (Level 3, honey thick) Moderately thick liquids (Level 3, honey thick): Impaired Bolus delivery method: Spoon Moderately Thick Liquid - Impairment: Oral Impairment; Pharyngeal impairment Lip Closure: No labial escape Tongue control during bolus hold: Cohesive bolus between tongue to palatal seal Bolus transport/lingual motion: Brisk tongue motion Oral residue: Complete oral clearance Location of oral residue : N/A Initiation of swallow : Posterior angle of the ramus Soft palate elevation: No bolus between soft palate (SP)/pharyngeal wall (PW) Laryngeal elevation: Complete superior movement of thyroid cartilage with complete approximation of arytenoids to epiglottic petiole Anterior hyoid excursion: Complete Epiglottic movement: Complete Laryngeal vestibule closure: Complete, no air/contrast in laryngeal vestibule Pharyngeal stripping wave : Present - complete Pharyngoesophageal segment opening: Partial distention/partial duration, partial obstruction of flow Tongue base retraction: Trace column of contrast or air between tongue base  and PPW Pharyngeal residue: Trace residue within or on pharyngeal structures Location of pharyngeal residue: Valleculae Penetration/Aspiration Scale (PAS) score: 1.  Material does not enter airway  Puree: Puree Puree: Impaired Puree - Impairment: Oral Impairment; Pharyngeal impairment Lip Closure: No labial escape Bolus transport/lingual motion: Brisk tongue motion Oral residue: Complete oral clearance Location of oral residue : N/A Initiation of swallow: Posterior angle of the ramus Soft palate elevation: No bolus between soft palate (SP)/pharyngeal wall (PW) Laryngeal elevation: Complete superior movement of thyroid cartilage with complete approximation of arytenoids to epiglottic petiole Anterior hyoid excursion: Complete Epiglottic movement: Complete Laryngeal vestibule closure: Complete, no air/contrast in laryngeal vestibule Pharyngeal stripping wave : Present - complete Pharyngeal contraction (A/P view only): N/A Pharyngoesophageal segment opening: Partial distention/partial duration, partial obstruction of flow Tongue base retraction: Trace column of contrast or air between tongue base and PPW Pharyngeal residue: Trace residue within or on pharyngeal structures Location of pharyngeal residue: Valleculae Penetration/Aspiration Scale (PAS) score: 1.  Material does not enter airway Solid: Solid Solid: Impaired Solid - Impairment: Oral Impairment; Pharyngeal impairment Lip Closure: No labial escape Bolus preparation/mastication: Timely and efficient chewing and mashing Bolus transport/lingual motion: Brisk tongue motion Oral residue: Complete oral clearance Initiation of swallow: Valleculae Soft palate elevation: No bolus between soft palate (SP)/pharyngeal wall (PW) Laryngeal elevation: Complete superior movement of thyroid cartilage with complete approximation of arytenoids to epiglottic petiole Anterior hyoid excursion: Complete Epiglottic movement: Complete Laryngeal vestibule closure: Complete, no  air/contrast in laryngeal vestibule Pharyngeal stripping wave : Present - complete Pharyngoesophageal segment opening: Partial distention/partial duration, partial obstruction of flow Tongue base retraction: Trace column of contrast or air between tongue base and PPW Pharyngeal residue: Trace residue within or on pharyngeal structures Location of pharyngeal residue: Valleculae Penetration/Aspiration Scale (PAS) score: 1.  Material does not enter airway Pill: Pill Pill: Impaired Pill - Impairment: Pharyngeal impairment Initiation of swallow : Posterior laryngeal surface of the  epiglottis Soft palate elevation: No bolus between soft palate (SP)/pharyngeal wall (PW) Laryngeal elevation: Complete superior movement of thyroid cartilage with complete approximation of arytenoids to epiglottic petiole Anterior hyoid excursion: Complete Epiglottic movement: Complete Laryngeal vestibule closure: Incomplete, narrow column air/contrast in laryngeal vestibule Pharyngeal stripping wave : Present - complete Pharyngeal contraction (A/P view only): N/A Pharyngoesophageal segment opening: Partial distention/partial duration, partial obstruction of flow Tongue base retraction: Trace column of contrast or air between tongue base and PPW Pharyngeal residue: Trace residue within or on pharyngeal structures Location of pharyngeal residue: Valleculae Penetration/Aspiration Scale (PAS) score: 2.  Material enters airway, remains ABOVE vocal cords then ejected out Compensatory Strategies: Compensatory Strategies Compensatory strategies: Yes Straw: Effective   General Information: Caregiver present: No  Diet Prior to this Study: Regular; Mildly thick liquids (Level 2, nectar thick)   Temperature : Normal   Respiratory Status: WFL   No data recorded  History of Recent Intubation: Yes  Behavior/Cognition: Alert; Cooperative; Pleasant mood Self-Feeding Abilities: Able to self-feed Baseline vocal quality/speech: Normal Volitional Cough: Able to  elicit Volitional Swallow: Able to elicit Exam Limitations: No limitations Goal Planning: Prognosis for improved oropharyngeal function: Good No data recorded No data recorded Patient/Family Stated Goal: resume PO intake Consulted and agree with results and recommendations: Patient Pain: Pain Assessment Pain Assessment: No/denies pain Pain Score: 4 Faces Pain Scale: 0 Breathing: 0 Negative Vocalization: 0 Facial Expression: 0 Body Language: 0 Consolability: 0 PAINAD Score: 0 Facial Expression: 0 Body Movements: 0 Muscle Tension: 0 Compliance with ventilator (intubated pts.): N/A Vocalization (extubated pts.): N/A CPOT Total: 0 Pain Location: rectum; rt heel Pain Descriptors / Indicators: Discomfort; Grimacing Pain Intervention(s): Monitored during session End of Session: Start Time:SLP Start Time (ACUTE ONLY): [id number] Stop Time: SLP Stop Time (ACUTE ONLY): 0953 Time Calculation:SLP Time Calculation (min) (ACUTE ONLY): 28 min Charges: SLP Evaluations $ SLP Speech Visit: 1 Visit SLP Evaluations $BSS Swallow: 1 Procedure $MBS Swallow: 1 Procedure $FEES Swallow: 1 Procedure $Swallowing Treatment: 1 Procedure SLP visit diagnosis: SLP Visit Diagnosis: Dysphagia, oropharyngeal phase (R13.12) Past Medical History: Past Medical History: Diagnosis Date  Anemia   Cirrhosis ([place])   DVT (deep venous thrombosis) (HCC)   Low iron   Neuropathy   Neuropathy   Panic attacks   Pulmonary embolus ([place]) 2019 Past Surgical History: Past Surgical History: Procedure Laterality Date  ABDOMINAL HYSTERECTOMY    BIOPSY N/A 06/24/2013  Procedure: BIOPSY TERMINAL ILEUM;  Surgeon: Daneil Dolin, MD;  [facility]: [facility];  Service: Endoscopy;  Laterality: N/A;  CHOLECYSTECTOMY    COLONOSCOPY N/A 06/24/2013  [id number] hemorrhoids/otherwise normal   COLONOSCOPY WITH PROPOFOL N/A 05/31/2021  Surgeon: Daneil Dolin, MD; ery large nonbleeding internal and external hemorrhoids, portal colopathy.  Recommended repeat colonoscopy in 10 years.   ESOPHAGOGASTRODUODENOSCOPY (EGD) WITH PROPOFOL N/A 03/29/2021  Surgeon: Eloise Harman, DO;  grade 1 esophageal varices, portal hypertensive gastropathy with friable tissue with spontaneous ooze, normal duodenum.  ESOPHAGOGASTRODUODENOSCOPY (EGD) WITH PROPOFOL N/A 05/31/2021  Surgeon: Daneil Dolin, MD;  grade 1-2 esophageal varices without bleeding stigmata, advanced appearing portal hypertensive gastropathy.  ESOPHAGOGASTRODUODENOSCOPY (EGD) WITH PROPOFOL N/A 12/22/2021  Procedure: ESOPHAGOGASTRODUODENOSCOPY (EGD) WITH PROPOFOL;  Surgeon: Harvel Quale, MD;  Location: [facility];  Service: Gastroenterology;  Laterality: N/A;  IR PARACENTESIS  03/13/2022  nasal bone surgery    NASAL SINUS SURGERY    SINUS IRRIGATION    TONSILLECTOMY    TUBAL LIGATION   [name] 03/18/2022, 10:26 AM  DG  Abd 1 View  Result Date: 03/16/2022 CLINICAL DATA:  Abdominal pain and distension EXAM: ABDOMEN - 1 VIEW COMPARISON:  03/08/2022 FINDINGS: Previously seen feeding catheter has been removed. Scattered large and small bowel gas is noted. No obstructive changes are seen. No free air is noted. No acute bony abnormality is noted. IMPRESSION: No acute abnormality seen. Electronically Signed   By: Inez Catalina M.D.   On: 03/16/2022 23:36   IR Paracentesis  Result Date: 03/13/2022 INDICATION: cirrhosis with ascites EXAM: ULTRASOUND GUIDED DIAGNOSTIC AND THERAPEUTIC PARACENTESIS MEDICATIONS: 5 cc 1% lidocaine COMPLICATIONS: None immediate. PROCEDURE: Informed written consent was obtained from the patient after a discussion of the risks, benefits and alternatives to treatment. A timeout was performed prior to the initiation of the procedure. Initial ultrasound scanning demonstrates a large amount of ascites within the right lower abdominal quadrant. The right lower abdomen was prepped and draped in the usual sterile fashion. 1% lidocaine was used for local anesthesia. Following this, a 19 gauge, 7-cm,  Yueh catheter was introduced. An ultrasound image was saved for documentation purposes. The paracentesis was performed. The catheter was removed and a dressing was applied. The patient tolerated the procedure well without immediate post procedural complication. FINDINGS: A total of approximately 3.1 L of dark yellow fluid was removed. Samples were sent to the laboratory as requested by the clinical team. IMPRESSION: Successful ultrasound-guided paracentesis yielding 3.1 liters of peritoneal fluid. PLAN: The patient has previously been formally evaluated by the [facility] Interventional Radiology Portal Hypertension Clinic and is being actively followed for potential future intervention. Read by: Reatha Armour, PA-C Electronically Signed   By: Lucrezia Europe M.D.   On: 03/13/2022 13:06   DG CHEST PORT 1 VIEW  Result Date: 03/10/2022 CLINICAL DATA:  [id number].  Congestion of upper airway. EXAM: PORTABLE CHEST 1 VIEW COMPARISON:  Portable chest 03/04/2022. FINDINGS: 12:14 a.m. Interval extubation, removal NGT insertion of a feeding tube. The feeding tube extends at least to the gastric antrum but is not included in the film beyond that point. The cardiac size is normal. No vascular congestion is seen. The lungs are expiratory. There is secondary bronchovascular crowding in the lung bases. No convincing focal pneumonia. The lungs are otherwise clear. The mediastinum is normally outlined.  No acute osseous findings. IMPRESSION: Expiratory chest x-ray with bronchovascular crowding in the lung bases. No convincing acute chest findings. Limited view of the bases. Electronically Signed   By: Telford Nab M.D.   On: 03/10/2022 00:38   DG Abd Portable 1V  Result Date: 03/08/2022 CLINICAL DATA:  Enteric tube placement EXAM: PORTABLE ABDOMEN - 1 VIEW COMPARISON:  03/05/2022 abdominal radiograph FINDINGS: Weighted enteric tube tip is in the descending duodenum. No dilated small bowel loops. No evidence of pneumatosis or  pneumoperitoneum in the visualized upper abdomen. Mild streaky bibasilar lung opacities. IMPRESSION: 1. Weighted enteric tube tip is in the descending duodenum. 2. Mild streaky bibasilar lung opacities, favor atelectasis. Electronically Signed   By: Ilona Sorrel M.D.   On: 03/08/2022 09:42   DG Abd 1 View  Result Date: 03/05/2022 CLINICAL DATA:  Nasogastric tube placement. EXAM: ABDOMEN - 1 VIEW COMPARISON:  March 01, 2022 FINDINGS: Enteric catheter overlies the gastric bubble with tip at the lateral gastric body. Side hole is below the GE junction. Nonspecific bowel gas pattern. IMPRESSION: Enteric catheter with tip at the lateral gastric body. Electronically Signed   By: Fidela Salisbury M.D.   On: 03/05/2022 17:35   DG CHEST PORT 1  VIEW  Result Date: 03/04/2022 CLINICAL DATA:  Aspiration. EXAM: PORTABLE CHEST 1 VIEW COMPARISON:  March 01, 2022. FINDINGS: The heart size and mediastinal contours are within normal limits. Nasogastric tube is seen entering stomach. Endotracheal tube is noted with tip 8 cm above the carina. Both lungs are clear. The visualized skeletal structures are unremarkable. IMPRESSION: Endotracheal and nasogastric tubes are noted as described above. No acute cardiopulmonary abnormality seen. Electronically Signed   By: Marijo Conception M.D.   On: 03/04/2022 13:01   US RENAL  Result Date: 03/02/2022 CLINICAL DATA:  Renal failure.  History of cirrhosis and ascites. EXAM: RENAL / URINARY TRACT ULTRASOUND COMPLETE COMPARISON:  CT of the abdomen on 02/13/2022 FINDINGS: Right Kidney: Renal measurements: 10.3 x 5.0 x 5.2 cm = volume: 133 mL. No hydronephrosis. Mildly increased cortical echogenicity. No focal lesions or shadowing calculi. Left Kidney: Renal measurements: 11.0 x 4.7 x 5.0 cm = volume: 136 mL. No hydronephrosis. Mildly increased cortical echogenicity. No focal lesions or shadowing calculi. Bladder: Not visualized due to moderate ascites. Other: Moderate ascites present in  the peritoneal cavity. IMPRESSION: 1. Mildly increased cortical echogenicity of both kidneys consistent with chronic kidney disease. No evidence of hydronephrosis. 2. Moderate ascites in the peritoneal cavity. Electronically Signed   By: Aletta Edouard M.D.   On: 03/02/2022 14:50   Portable Chest x-ray  Result Date: 03/01/2022 CLINICAL DATA:  Intubated EXAM: PORTABLE CHEST 1 VIEW COMPARISON:  03/01/2022 FINDINGS: Single frontal view of the chest demonstrates interval retraction of the endotracheal tube, which overlies tracheal air column 3 cm above carina. Enteric catheter passes below diaphragm, tip projecting over the gastric body. Cardiac silhouette is unremarkable. Lung volumes are diminished, without airspace disease, effusion, or pneumothorax. No acute bony abnormality. IMPRESSION: 1. Interval retraction of the endotracheal tube, tip now 3 cm above carina. 2. Low lung volumes. Electronically Signed   By: Randa Ngo M.D.   On: 03/01/2022 21:58   DG Abd 1 View  Result Date: 03/01/2022 CLINICAL DATA:  Evaluate OG tube placement EXAM: ABDOMEN - 1 VIEW COMPARISON:  02/13/2022 FINDINGS: Interval placement of enteric tube with tip in the expected location of the body of stomach. Side port is well below the GE junction. No dilated loops of small or large bowel. IMPRESSION: Tip of enteric tube is in the body of stomach. Electronically Signed   By: Kerby Moors M.D.   On: 03/01/2022 14:08   DG Chest Portable 1 View  Result Date: 03/01/2022 CLINICAL DATA:  Status post intubation. EXAM: PORTABLE CHEST 1 VIEW COMPARISON:  February 20, 2022. FINDINGS: The heart size and mediastinal contours are within normal limits. Both lungs are clear. Endotracheal tube is noted with distal tip 1 cm above the carina. The visualized skeletal structures are unremarkable. IMPRESSION: Endotracheal tube is noted with distal tip 1 cm above the carina. Withdrawal by 2-3 cm is recommended. Electronically Signed   By: Marijo Conception M.D.   On: 03/01/2022 13:45   CT Head Wo Contrast  Result Date: 03/01/2022 CLINICAL DATA:  Mental status change, unknown cause. History of liver disease. EXAM: CT HEAD WITHOUT CONTRAST TECHNIQUE: Contiguous axial images were obtained from the base of the skull through the vertex without intravenous contrast. RADIATION DOSE REDUCTION: This exam was performed according to the departmental dose-optimization program which includes automated exposure control, adjustment of the mA and/or kV according to patient size and/or use of iterative reconstruction technique. COMPARISON:  Head CT 11/09/2008. FINDINGS: Moderate motion artifact  despite repeat. Brain: No acute hemorrhage, mass effect or midline shift. Gray-white differentiation is preserved. No hydrocephalus. No extra-axial collection. Basilar cisterns are patent. Vascular: No hyperdense vessel or unexpected calcification. Skull: No calvarial fracture or suspicious bone lesion. Skull base is unremarkable. Sinuses/Orbits: Paranasal sinuses, mastoid air cells, and middle ear cavities are well aerated. Orbits are unremarkable. Other: None. IMPRESSION: Motion degraded study. No acute intracranial abnormality identified. Electronically Signed   By: Emmit Alexanders M.D.   On: 03/01/2022 12:52   (Echo, Carotid, EGD, Colonoscopy, ERCP)    Subjective: No complaints feels better.  Discharge Exam: Vitals:   03/26/22 0347 03/26/22 0801  BP: 108/71 129/83  Pulse: 93 (!) 102  Resp: 18 17  Temp: 98.9 F (37.2 C) 98.4 F (36.9 C)  SpO2: 98% 100%   Vitals:   03/26/22 0006 03/26/22 0347 03/26/22 0500 03/26/22 0801  BP: 108/76 108/71  129/83  Pulse: 93 93  (!) 102  Resp: '20 18  17  '$ Temp: 98.6 F (37 C) 98.9 F (37.2 C)  98.4 F (36.9 C)  TempSrc: Oral Oral  Oral  SpO2: 100% 98%  100%  Weight:   69.5 kg   Height:        General: Pt is alert, awake, not in acute distress Cardiovascular: RRR, S1/S2 +, no rubs, no gallops Respiratory: CTA bilaterally,  no wheezing, no rhonchi Abdominal: Soft, NT, ND, bowel sounds + Extremities: no edema, no cyanosis    The results of significant diagnostics from this hospitalization (including imaging, microbiology, ancillary and laboratory) are listed below for reference.     Microbiology: Recent Results (from the past 240 hour(s))  MRSA Next Gen by PCR, Nasal     Status: None   Collection Time: 03/21/22  3:19 PM   Specimen: Nasal Mucosa; Nasal Swab  Result Value Ref Range Status   MRSA by PCR Next Gen NOT DETECTED NOT DETECTED Final    Comment: (NOTE) The GeneXpert MRSA Assay (FDA approved for NASAL specimens only), is one component of a comprehensive MRSA colonization surveillance program. It is not intended to diagnose MRSA infection nor to guide or monitor treatment for MRSA infections. Test performance is not FDA approved in patients less than 16 years old. Performed at [facility], [place] [address], [place], [place] [address]      Labs: BNP (last 3 results) No results for input(s): "BNP" in the last 8760 hours. Basic Metabolic Panel: Recent Labs  Lab 03/21/22 1559 03/21/22 1720 03/22/22 0719 03/23/22 0131 03/24/22 0236 03/25/22 0228 03/26/22 0314  NA 135   < > 132* 129* 131* 130* 128*  K 4.5   < > 4.7 4.2 3.4* 4.2 3.6  CL 105  --  104 102 104 104 97*  CO2 21*  --  18* 17* 19* 20* 21*  GLUCOSE 145*  --  111* 159* 115* 132* 139*  BUN 20  --  31* 28* '16 10 6  '$ CREATININE 0.98  --  1.08* 1.13* 0.88 0.81 0.83  CALCIUM 7.9*  --  8.1* 8.0* 8.0* 8.1* 7.9*  MG 1.4*  --  2.5*  --   --   --   --   PHOS  --   --  5.2*  --   --   --   --    < > = values in this interval not displayed.   Liver Function Tests: Recent Labs  Lab 03/20/22 2330 03/22/22 0719 03/23/22 0131 03/24/22 0236 03/25/22 0228  AST 43*  57* 46* 52* 42*  ALT 35 38 36 37 31  ALKPHOS 74 83 76 91 80  BILITOT 2.6* 2.6* 2.7* 2.8* 1.9*  PROT 4.4* 4.9* 4.6* 4.6* 4.9*  ALBUMIN 2.4* 2.7* 2.4* 2.5* 2.9*   Recent  Labs  Lab 03/20/22 2330  LIPASE 84*   Recent Labs  Lab 03/20/22 2330 03/22/22 0719  AMMONIA 34 175*   CBC: Recent Labs  Lab 03/20/22 2330 03/21/22 0654 03/22/22 0719 03/23/22 0131 03/24/22 0236 03/25/22 0228 03/26/22 0314  WBC 10.4   < > 13.8* 10.4 8.3 6.2 8.2  NEUTROABS 6.1  --   --   --   --   --  4.0  HGB 6.1*  6.1*   < > 10.2* 8.3* 8.1* 7.6* 8.6*  HCT 19.3*  18.0*   < > 29.0* 24.2* 25.0* 24.2* 26.0*  MCV 95.1   < > 87.9 86.4 89.3 92.7 89.3  PLT 242   < > 241 217 194 154 165   < > = values in this interval not displayed.   Cardiac Enzymes: No results for input(s): "CKTOTAL", "CKMB", "CKMBINDEX", "TROPONINI" in the last 168 hours. BNP: Invalid input(s): "POCBNP" CBG: Recent Labs  Lab 03/25/22 0823 03/25/22 1134 03/25/22 1718 03/25/22 2045 03/26/22 0758  GLUCAP 110* 140* 112* 151* 119*   D-Dimer No results for input(s): "DDIMER" in the last 72 hours. Hgb A1c No results for input(s): "HGBA1C" in the last 72 hours. Lipid Profile No results for input(s): "CHOL", "HDL", "LDLCALC", "TRIG", "CHOLHDL", "LDLDIRECT" in the last 72 hours. Thyroid function studies No results for input(s): "TSH", "T4TOTAL", "T3FREE", "THYROIDAB" in the last 72 hours.  Invalid input(s): "FREET3" Anemia work up No results for input(s): "VITAMINB12", "FOLATE", "FERRITIN", "TIBC", "IRON", "RETICCTPCT" in the last 72 hours. Urinalysis    Component Value Date/Time   COLORURINE AMBER (A) 03/02/2022 0150   APPEARANCEUR CLOUDY (A) 03/02/2022 0150   LABSPEC 1.019 03/02/2022 0150   PHURINE 5.0 03/02/2022 0150   GLUCOSEU NEGATIVE 03/02/2022 0150   HGBUR MODERATE (A) 03/02/2022 0150   BILIRUBINUR NEGATIVE 03/02/2022 0150   KETONESUR NEGATIVE 03/02/2022 0150   PROTEINUR 100 (A) 03/02/2022 0150   UROBILINOGEN 0.2 04/10/2013 1432   NITRITE NEGATIVE 03/02/2022 0150   LEUKOCYTESUR SMALL (A) 03/02/2022 0150   Sepsis Labs Recent Labs  Lab 03/23/22 0131 03/24/22 0236 03/25/22 0228  03/26/22 0314  WBC 10.4 8.3 6.2 8.2   Microbiology Recent Results (from the past 240 hour(s))  MRSA Next Gen by PCR, Nasal     Status: None   Collection Time: 03/21/22  3:19 PM   Specimen: Nasal Mucosa; Nasal Swab  Result Value Ref Range Status   MRSA by PCR Next Gen NOT DETECTED NOT DETECTED Final    Comment: (NOTE) The GeneXpert MRSA Assay (FDA approved for NASAL specimens only), is one component of a comprehensive MRSA colonization surveillance program. It is not intended to diagnose MRSA infection nor to guide or monitor treatment for MRSA infections. Test performance is not FDA approved in patients less than 69 years old. Performed at [facility], [place] [address], [place], [place] [address]     SIGNED:   Charlynne Cousins, MD  [facility] 03/26/2022, 8:56 AM Pager   If 7PM-7AM, please contact night-coverage [email or web address] Password [email or web address]

## 2022-03-26 NOTE — Progress Notes (Signed)
Pt c/o SOB,  and that she feels heaviness in her chest. VSS. Pt oxygen saturation was 100 on RA. Lung sound diminished Bilat, no wheezing noted. On call provider notified who stated to placed pt on 2 L via [place]. Pt stated the SOB subside when placed on oxygen.

## 2022-03-26 NOTE — Progress Notes (Signed)
Patient was discharged by Einar Gip RN on the unit.   I verified with both TOC and Main pharmacy to ensure the patient had all her home medicines before discharging home.   TOC confirmed the patient's discharge meds were delivered to the patient.  Main Pharmacy confirmed there were no personal meds in the pharmacy.

## 2022-04-01 ENCOUNTER — Telehealth: Payer: Self-pay | Admitting: *Deleted

## 2022-04-01 ENCOUNTER — Ambulatory Visit (HOSPITAL_COMMUNITY)
Admission: RE | Admit: 2022-04-01 | Discharge: 2022-04-01 | Disposition: A | Discharge status: Home / Self Care | Payer: Medicaid Other | Source: Ambulatory Visit | Attending: Gastroenterology | Admitting: Gastroenterology

## 2022-04-01 ENCOUNTER — Encounter (HOSPITAL_COMMUNITY): Payer: Self-pay

## 2022-04-01 ENCOUNTER — Other Ambulatory Visit: Payer: Self-pay | Admitting: *Deleted

## 2022-04-01 VITALS — BP 108/63 | HR 77 | Temp 98.4°F | Resp 18

## 2022-04-01 DIAGNOSIS — K7031 Alcoholic cirrhosis of liver with ascites: Secondary | ICD-10-CM | POA: Diagnosis present

## 2022-04-01 DIAGNOSIS — R188 Other ascites: Secondary | ICD-10-CM

## 2022-04-01 LAB — BODY FLUID CELL COUNT WITH DIFFERENTIAL
Eos, Fluid: 0 %
Lymphs, Fluid: 38 %
Monocyte-Macrophage-Serous Fluid: 60 % (ref 50–90)
Neutrophil Count, Fluid: 2 % (ref 0–25)
Total Nucleated Cell Count, Fluid: 67 cu mm (ref 0–1000)

## 2022-04-01 MED ORDER — ALBUMIN HUMAN 25 % IV SOLN
0.0000 g | Freq: Once | INTRAVENOUS | Status: DC
  Filled 2022-04-01: qty 400

## 2022-04-01 NOTE — Telephone Encounter (Signed)
Pt called and states she needs a paracentesis. She states her stomach is really swollen and she is hurting.

## 2022-04-01 NOTE — Telephone Encounter (Signed)
Pt informed to arrive today at 12:45 pm at [facility] for the para. Advised pt to hold diuretics toaday and pt stated that she had already taken morning dose of lasix but won't take the afternoon dose.

## 2022-04-01 NOTE — Telephone Encounter (Signed)
Last para on 2/26 s/p 8.5 liters removed.  Please arrange para today with max of 5 liters. Albumin per protocol. Please request cell count for fluid analysis. Hold diuretics today if not already taken. Can resume tomorrow.   Needs outpatient appt this week or early next week with Aliene Altes if availability. Otherwise any APP.

## 2022-04-01 NOTE — Progress Notes (Signed)
Patient tolerated right sided paracentesis procedure today and 5 Liters of clear yellow ascites removed with labs collected and sent to lab for processing at 1318. Pt verbalized understanding of discharge instructions and ambulatory at departure with no acute distress noted.

## 2022-04-01 NOTE — Telephone Encounter (Signed)
Spoke to pt informed her she could go today and have paracentesis. Pt voiced understanding.

## 2022-04-02 ENCOUNTER — Encounter: Payer: Medicaid Other | Admitting: Gastroenterology

## 2022-04-02 NOTE — Progress Notes (Unsigned)
Referring Provider: Neale Burly, MD Primary Care Physician:  Neale Burly, MD Primary GI Physician: Dr. Gala Romney  Chief Complaint  Patient presents with   Follow-up    HPI:   Erica Banks is a 42 y.o. female with history of alcoholic cirrhosis complicated by recurrent ascites, SBP on daily Cipro for prophylaxis, hepatic encephalopathy, esophageal varices not on NSBB due to hypotension previously, variceal bleed in February 2024 s/p banding and recommended starting NSBB. Also with history of IDA following with hematology receiving IV iron, GERD, PE, DVT, neuropathy. She is presenting today for follow-up.   She was last seen in our office 03/01/2022.  She was encephalopathic at that time and was sent straight to the emergency room.  She presented to [facility] emergency department where she became severely lethargic and was intubated in the emergency room for "airway protection" and ultimately transferred to [facility].  Family indicated patient had been noncompliant with lactulose at home.  She was treated with lactulose and Xifaxan with improvement.  She was also found to have severe sepsis due to ESBL Klebsiella UTI and completed a course of ertapenem.  She was discharged on 2/19.  Patient presented back to [facility] 03/20/2022 with nausea, vomiting, hematemesis, Hgb 6.1.  She was urgently taken for EGD under general anesthesia with intubation which revealed actively bleeding esophageal varices s/p banding x 5 with bleeding resolved, portal hypertensive gastropathy. She was extubated 2/23. Received ?4 units PRBCs during admission with hemoglobin 8.6 day of discharge, 2/27. Recommended EGD in 3-4 weeks and starting nadolol 20 mg daily for bleeding prophylaxis.   Patient called 3/4 requesting paracentesis. Paracentesis completed 04/01/22 yielding 5L.PMNs <250.  Today:   Cirrhosis:  MELD 3.0: 22 during admission on 2/26. CMP and INR updated yesterday corresponding to MELD 3.0 18. [place]:  Last imaging is CT A/P with contrast 02/13/22 with hepatic cirrhosis, some increase in geographic hypodensity throughout the liver probably reflecting component of steatosis.  Stated this appeared infiltrative rather than masslike. AFP: elevated at 6.1 in January with recommendations to repeat in 3 months.  EGD: Needs EGD in a couple of weeks for possible variceal banding. Last EGD 03/21/21 actively bleeding esophageal varices s/p banding x 5 with bleeding resolved, portal hypertensive gastropathy. NSBB: Made her feel bad last time at higher doses. Tolerating current dose well and would like to continue this dose for now.  Ascites/peripheral edema: Reports her abdomen is quite distended again and feels tight. Can't get comfortable. SOB last night. Scheduled for para Monday but doesn't think she can wait that long.  Reports compliance with low sodium diet. No caned foods, deli meat, frozen foods, fast foods. Eating as clean as she can.  Diuretics: Taking Lasix 40 mg BID. Previously taking Aldactone 100 mg daily, increased to 150 mg daily by Roosevelt Locks, NP yesterday. She is picking up this Rx today.  Encephalopathy: None. 2 Bms daily. 15 ml TID and Xifaxan 550 mg BID.  GI bleeding: None since last hospitalization.  SBP: History of SBP in the past. Not on cipro at this time.   ETOH: None sine [id number] Illicit drug use: None. Stopped marijuana about 2 months ago.   States Dawn did not recommend TIPS due to encephalopathy. Going to [facility] 3/25-3/27 for evaluation for transplant.   Hasn't seen hematology in a while since frequent hospitalizations.    Past Medical History:  Diagnosis Date   Anemia    Cirrhosis ([place])    DVT (deep venous  thrombosis) ([facility])    Low iron    Neuropathy    Neuropathy    Panic attacks    Pulmonary embolus ([place]) 2019    Past Surgical History:  Procedure Laterality Date   ABDOMINAL HYSTERECTOMY     BIOPSY N/A 06/24/2013   Procedure: BIOPSY TERMINAL ILEUM;  Surgeon:  Daneil Dolin, MD;  Location: [facility];  Service: Endoscopy;  Laterality: N/A;   CHOLECYSTECTOMY     COLONOSCOPY N/A 06/24/2013   [id number] hemorrhoids/otherwise normal    COLONOSCOPY WITH PROPOFOL N/A 05/31/2021   Surgeon: Daneil Dolin, MD; ery large nonbleeding internal and external hemorrhoids, portal colopathy.  Recommended repeat colonoscopy in 10 years.   ESOPHAGEAL BANDING  03/21/2022   Procedure: ESOPHAGEAL BANDING;  Surgeon: Thornton Park, MD;  Location: [facility] ENDOSCOPY;  Service: Gastroenterology;;   ESOPHAGOGASTRODUODENOSCOPY (EGD) WITH PROPOFOL N/A 03/29/2021   Surgeon: Eloise Harman, DO;  grade 1 esophageal varices, portal hypertensive gastropathy with friable tissue with spontaneous ooze, normal duodenum.   ESOPHAGOGASTRODUODENOSCOPY (EGD) WITH PROPOFOL N/A 05/31/2021   Surgeon: Daneil Dolin, MD;  grade 1-2 esophageal varices without bleeding stigmata, advanced appearing portal hypertensive gastropathy.   ESOPHAGOGASTRODUODENOSCOPY (EGD) WITH PROPOFOL N/A 12/22/2021   Procedure: ESOPHAGOGASTRODUODENOSCOPY (EGD) WITH PROPOFOL;  Surgeon: Harvel Quale, MD;  Location: [facility];  Service: Gastroenterology;  Laterality: N/A;   ESOPHAGOGASTRODUODENOSCOPY (EGD) WITH PROPOFOL N/A 03/21/2022   Surgeon: Thornton Park, MD;  actively bleeding esophageal varices s/p banding x 5 with bleeding resolved, portal hypertensive gastropathy.   IR PARACENTESIS  03/13/2022   IR PARACENTESIS  03/25/2022   nasal bone surgery     NASAL SINUS SURGERY     SINUS IRRIGATION     TONSILLECTOMY     TUBAL LIGATION      Current Outpatient Medications  Medication Sig Dispense Refill   furosemide (LASIX) 40 MG tablet Take 1 tablet (40 mg total) by mouth 2 (two) times daily. 60 tablet 2   lactulose (CHRONULAC) 10 GM/15ML solution Take 15 mLs (10 g total) by mouth 3 (three) times daily. 1350 mL 2   Multiple Vitamin (MULTIVITAMIN WITH MINERALS) TABS tablet Take 1 tablet by  mouth daily. 30 tablet 2   nadolol (CORGARD) 20 MG tablet Take 0.5 tablets (10 mg total) by mouth daily. 30 tablet 0   pantoprazole (PROTONIX) 40 MG tablet Take 1 tablet (40 mg total) by mouth 2 (two) times daily before a meal. 60 tablet 2   rifaximin (XIFAXAN) 550 MG TABS tablet Take 1 tablet (550 mg total) by mouth 2 (two) times daily. (Patient taking differently: Take 550 mg by mouth daily.) 60 tablet 2   spironolactone (ALDACTONE) 100 MG tablet Take 150 mg by mouth daily.     sulfamethoxazole-trimethoprim (BACTRIM DS) 800-160 MG tablet Take 1 tablet by mouth daily. 30 tablet 5   No current facility-administered medications for this visit.    Allergies as of 04/04/2022 - Review Complete 04/04/2022  Allergen Reaction Noted   Demerol Anaphylaxis 10/12/2010    Family History  Problem Relation Age of Onset   Diabetes Mother    Hypertension Mother    COPD Mother    Cirrhosis Father        ETOH   Diabetes Sister    Hypertension Sister    Crohn's disease Maternal Grandmother    Cancer Other    Colon cancer Neg Hx    Autoimmune disease Neg Hx     Social History   Socioeconomic History  Marital status: Married    Spouse name: Not on file   Number of children: Not on file   Years of education: Not on file   Highest education level: Not on file  Occupational History   Occupation: Unemployed    Comment: wants to go to nursing school in fall 2015  Tobacco Use   Smoking status: Every Day    Packs/day: 0.50    Years: 12.00    Total pack years: 6.00    Types: Cigarettes   Smokeless tobacco: Never  Vaping Use   Vaping Use: Never used  Substance and Sexual Activity   Alcohol use: Not Currently    Alcohol/week: 4.0 - 5.0 standard drinks of alcohol    Types: 4 - 5 Cans of beer per week    Comment: None since February 2023.   Drug use: Yes    Types: Marijuana    Comment: Reports stopping in January 2024.   Sexual activity: Not Currently    Birth control/protection: Surgical   Other Topics Concern   Not on file  Social History Narrative   Not on file   Social Determinants of Health   Financial Resource Strain: Not on file  Food Insecurity: No Food Insecurity (03/08/2022)   Hunger Vital Sign    Worried About Running Out of Food in the Last Year: Never true    Ran Out of Food in the Last Year: Never true  Transportation Needs: No Transportation Needs (03/08/2022)   PRAPARE - Hydrologist (Medical): No    Lack of Transportation (Non-Medical): No  Physical Activity: Not on file  Stress: Not on file  Social Connections: Not on file    Review of Systems: Gen: Denies fever, chills, cold or flu like symptoms, pre-syncope, or syncope.   CV: Denies chest pain, palpitations. Resp: Denies dyspnea, cough.  GI: See HPI Heme: See HPI  Physical Exam: BP 108/72 (BP Location: Right Arm, Patient Position: Sitting, Cuff Size: Normal)   Pulse 88   Temp 98.1 F (36.7 C) (Oral)   Ht '5\' 6"'$  (1.676 m)   Wt 146 lb 3.2 oz (66.3 kg)   LMP  (LMP Unknown)   SpO2 100%   BMI 23.60 kg/m  General:   Alert and oriented. No distress noted. Pleasant and cooperative.  Head:  Normocephalic and atraumatic. Eyes:  Conjuctiva clear without scleral icterus. Heart:  S1, S2 present without murmurs appreciated. Lungs:  Clear to auscultation bilaterally. No wheezes, rales, or rhonchi. No distress.  Abdomen:  +BS, distended but soft abdomen. +Hepatosplenomegaly. Mild TTP in RUQ. Pitting edema in the flanks.  Msk:  Symmetrical without gross deformities. Normal posture. Extremities:  Without edema. Neurologic:  Alert and  oriented x4 Psych:  Normal mood and affect.    Assessment:  42 y.o. female with history of alcoholic cirrhosis complicated by recurrent ascites, SBP previously on daily Cipro for prophylaxis, hepatic encephalopathy, esophageal varices not on NSBB due to hypotension previously, variceal bleed in February 2024 s/p banding and recommended  starting NSBB. Also with history of IDA following with hematology receiving IV iron, GERD, PE, DVT, neuropathy. She is presenting today for follow-up of cirrhosis.   ETOH Cirrhosis:  Decompensated. Following with Roosevelt Locks, NP at [facility] in [place] and has plans to see [facility] later this month to start transplant evaluation. MELD 3.0 18.  Abstinent from alcohol since February 2023.  Stopped marijuana about 2 months ago.  Cirrhosis has been complicated by recurrent  ascites requiring frequent paracentesis, recurrent hepatic encephalopathy requiring hospitalization, SBP, esophageal varices with recent bleeding in February 2024 s/p banding, now restarted on low-dose nadolol with no recurrent bleeding.  Beta-blocker previously stopped due to labile blood pressures and patient not tolerating the medication well; however, she is doing well at this time with current dose though her heart rate is not at goal.  AFP was elevated at 6.1 in January with recommendations to repeat in 3 months.  Most recent imaging with CT A/P with contrast in January with hepatic cirrhosis, some increase in geographic hypodensity throughout the liver probably reflecting component of steatosis.   Ascites:  Recurrent ascites requiring paracentesis. Reports compliance with Lasix 40 mg BID and spironolactone 100 mg daily. Spironolactone was increased to 150 mg daily yesterday per Roosevelt Locks, NP. Dawn also ordered paracentesis every 2 weeks. Patient currently scheduled for paracentesis Monday, but feels she can't make it to Monday as her abdomen is distended and she is uncomfortable. We will arrange for paracentesis today. Unfortunately she is not a good candidate for TIPS as she has recurrent encephalopathy. She is scheduled to see [facility] to start transplant evaluation later this month.   Esophageal varices:  Needs repeat EGD in a couple of weeks for possible variceal banding. Last EGD 03/21/21 actively bleeding esophageal varices  s/p banding x 5 with bleeding resolved, portal hypertensive gastropathy. Currently on 10 mg Nadolol. HR not at goal, but patient reports higher doses have previously made her feel bad and she prefers to continue current dose for now.   History of SBP:  Needs chronic prophylaxis. Previously on cipro but stopped this for uknown reason. I do get an alert today in the chart that patient has history of QT prolongation. Therefore, I have sent in Bactrim for SBP prophylaxis in place of ciprofloxacin.   Hepatic encephalopathy:  Recurrent. Requiring recurrent hospitalizations and intubation. No encephalopathy at this time. On Lactulose 15 ml TID and Xifaxan BID. Having 2 BMs daily.   IDA:  Chronic. Following with hematology for IV iron PRN. Suspect chronic occult GI bleeding  likely contributing in the setting of portal hypertensive gastropathy and portal colopathy.  Recently with acute blood loss anemia in the setting of variceal bleed in February s/p variceal banding x 5.  She required 4 units PRBCs during admission with hemoglobin improved to 8.6 on day of discharge  (2/27).  She denies any overt bleeding since that time. She hasn't see hematology in a while due to recurrent admissions. We will update cbc to ensure stability and have her follow-up with hematology.     Plan:  US paracentesis with labs and albumin today.  CBC Proceed with upper endoscopy with possible variceal banding with propofol by Dr. Gala Romney in near future. The risks, benefits, and alternatives have been discussed with the patient in detail. The patient states understanding and desires to proceed.  ASA 3 No ciprofloxacin due to history of prolonged QT.  Start Bactrim DS 1 tablet daily for SBP prophylaxis.  Increase Lactulose to 15 ml 4 times daily or take 30 ml in the morning and 15 ml at lunch and dinner. May increase further if needed. Goal of 3-4 BMs daily.  Continue Xifaxan BID Continue nadolol 10 mg daily.  Continue Lasix 40  mg BID.  Continue spironolactone 150 mg daily.  Strict 2 g daily sodium restriction.  Keep upcoming appointment with [facility].  Continue to follow with Roosevelt Locks, NP at [facility].  Follow-up with hematology.  Follow-up in our office in about 6 weeks.    Aliene Altes, PA-C [facility]astro[facility] 04/04/2022

## 2022-04-02 NOTE — H&P (View-Only) (Signed)
Referring Provider: Neale Burly, MD Primary Care Physician:  Neale Burly, MD Primary GI Physician: Dr. Gala Romney  Chief Complaint  Patient presents with   Follow-up    HPI:   Erica Banks is a 42 y.o. female with history of alcoholic cirrhosis complicated by recurrent ascites, SBP on daily Cipro for prophylaxis, hepatic encephalopathy, esophageal varices not on NSBB due to hypotension previously, variceal bleed in February 2024 s/p banding and recommended starting NSBB. Also with history of IDA following with hematology receiving IV iron, GERD, PE, DVT, neuropathy. She is presenting today for follow-up.   She was last seen in our office 03/01/2022.  She was encephalopathic at that time and was sent straight to the emergency room.  She presented to [facility] emergency department where she became severely lethargic and was intubated in the emergency room for "airway protection" and ultimately transferred to [facility].  Family indicated patient had been noncompliant with lactulose at home.  She was treated with lactulose and Xifaxan with improvement.  She was also found to have severe sepsis due to ESBL Klebsiella UTI and completed a course of ertapenem.  She was discharged on 2/19.  Patient presented back to [facility] 03/20/2022 with nausea, vomiting, hematemesis, Hgb 6.1.  She was urgently taken for EGD under general anesthesia with intubation which revealed actively bleeding esophageal varices s/p banding x 5 with bleeding resolved, portal hypertensive gastropathy. She was extubated 2/23. Received ?4 units PRBCs during admission with hemoglobin 8.6 day of discharge, 2/27. Recommended EGD in 3-4 weeks and starting nadolol 20 mg daily for bleeding prophylaxis.   Patient called 3/4 requesting paracentesis. Paracentesis completed 04/01/22 yielding 5L.PMNs <250.  Today:   Cirrhosis:  MELD 3.0: 22 during admission on 2/26. CMP and INR updated yesterday corresponding to MELD 3.0 18. [place]:  Last imaging is CT A/P with contrast 02/13/22 with hepatic cirrhosis, some increase in geographic hypodensity throughout the liver probably reflecting component of steatosis.  Stated this appeared infiltrative rather than masslike. AFP: elevated at 6.1 in January with recommendations to repeat in 3 months.  EGD: Needs EGD in a couple of weeks for possible variceal banding. Last EGD 03/21/21 actively bleeding esophageal varices s/p banding x 5 with bleeding resolved, portal hypertensive gastropathy. NSBB: Made her feel bad last time at higher doses. Tolerating current dose well and would like to continue this dose for now.  Ascites/peripheral edema: Reports her abdomen is quite distended again and feels tight. Can't get comfortable. SOB last night. Scheduled for para Monday but doesn't think she can wait that long.  Reports compliance with low sodium diet. No caned foods, deli meat, frozen foods, fast foods. Eating as clean as she can.  Diuretics: Taking Lasix 40 mg BID. Previously taking Aldactone 100 mg daily, increased to 150 mg daily by Roosevelt Locks, NP yesterday. She is picking up this Rx today.  Encephalopathy: None. 2 Bms daily. 15 ml TID and Xifaxan 550 mg BID.  GI bleeding: None since last hospitalization.  SBP: History of SBP in the past. Not on cipro at this time.   ETOH: None sine [id number] Illicit drug use: None. Stopped marijuana about 2 months ago.   States Dawn did not recommend TIPS due to encephalopathy. Going to [facility] 3/25-3/27 for evaluation for transplant.   Hasn't seen hematology in a while since frequent hospitalizations.    Past Medical History:  Diagnosis Date   Anemia    Cirrhosis ([place])    DVT (deep venous  thrombosis) (H[facility])    Low iron    Neuropathy    Neuropathy    Panic attacks    Pulmonary embolus ([place]) 2019    Past Surgical History:  Procedure Laterality Date   ABDOMINAL HYSTERECTOMY     BIOPSY N/A 06/24/2013   Procedure: BIOPSY TERMINAL ILEUM;  Surgeon:  Daneil Dolin, MD;  Location: [facility];  Service: Endoscopy;  Laterality: N/A;   CHOLECYSTECTOMY     COLONOSCOPY N/A 06/24/2013   [id number] hemorrhoids/otherwise normal    COLONOSCOPY WITH PROPOFOL N/A 05/31/2021   Surgeon: Daneil Dolin, MD; ery large nonbleeding internal and external hemorrhoids, portal colopathy.  Recommended repeat colonoscopy in 10 years.   ESOPHAGEAL BANDING  03/21/2022   Procedure: ESOPHAGEAL BANDING;  Surgeon: Thornton Park, MD;  Location: [facility] ENDOSCOPY;  Service: Gastroenterology;;   ESOPHAGOGASTRODUODENOSCOPY (EGD) WITH PROPOFOL N/A 03/29/2021   Surgeon: Eloise Harman, DO;  grade 1 esophageal varices, portal hypertensive gastropathy with friable tissue with spontaneous ooze, normal duodenum.   ESOPHAGOGASTRODUODENOSCOPY (EGD) WITH PROPOFOL N/A 05/31/2021   Surgeon: Daneil Dolin, MD;  grade 1-2 esophageal varices without bleeding stigmata, advanced appearing portal hypertensive gastropathy.   ESOPHAGOGASTRODUODENOSCOPY (EGD) WITH PROPOFOL N/A 12/22/2021   Procedure: ESOPHAGOGASTRODUODENOSCOPY (EGD) WITH PROPOFOL;  Surgeon: Harvel Quale, MD;  Location: [facility];  Service: Gastroenterology;  Laterality: N/A;   ESOPHAGOGASTRODUODENOSCOPY (EGD) WITH PROPOFOL N/A 03/21/2022   Surgeon: Thornton Park, MD;  actively bleeding esophageal varices s/p banding x 5 with bleeding resolved, portal hypertensive gastropathy.   IR PARACENTESIS  03/13/2022   IR PARACENTESIS  03/25/2022   nasal bone surgery     NASAL SINUS SURGERY     SINUS IRRIGATION     TONSILLECTOMY     TUBAL LIGATION      Current Outpatient Medications  Medication Sig Dispense Refill   furosemide (LASIX) 40 MG tablet Take 1 tablet (40 mg total) by mouth 2 (two) times daily. 60 tablet 2   lactulose (CHRONULAC) 10 GM/15ML solution Take 15 mLs (10 g total) by mouth 3 (three) times daily. 1350 mL 2   Multiple Vitamin (MULTIVITAMIN WITH MINERALS) TABS tablet Take 1 tablet by  mouth daily. 30 tablet 2   nadolol (CORGARD) 20 MG tablet Take 0.5 tablets (10 mg total) by mouth daily. 30 tablet 0   pantoprazole (PROTONIX) 40 MG tablet Take 1 tablet (40 mg total) by mouth 2 (two) times daily before a meal. 60 tablet 2   rifaximin (XIFAXAN) 550 MG TABS tablet Take 1 tablet (550 mg total) by mouth 2 (two) times daily. (Patient taking differently: Take 550 mg by mouth daily.) 60 tablet 2   spironolactone (ALDACTONE) 100 MG tablet Take 150 mg by mouth daily.     sulfamethoxazole-trimethoprim (BACTRIM DS) 800-160 MG tablet Take 1 tablet by mouth daily. 30 tablet 5   No current facility-administered medications for this visit.    Allergies as of 04/04/2022 - Review Complete 04/04/2022  Allergen Reaction Noted   Demerol Anaphylaxis 10/12/2010    Family History  Problem Relation Age of Onset   Diabetes Mother    Hypertension Mother    COPD Mother    Cirrhosis Father        ETOH   Diabetes Sister    Hypertension Sister    Crohn's disease Maternal Grandmother    Cancer Other    Colon cancer Neg Hx    Autoimmune disease Neg Hx     Social History   Socioeconomic History  Marital status: Married    Spouse name: Not on file   Number of children: Not on file   Years of education: Not on file   Highest education level: Not on file  Occupational History   Occupation: Unemployed    Comment: wants to go to nursing school in fall 2015  Tobacco Use   Smoking status: Every Day    Packs/day: 0.50    Years: 12.00    Total pack years: 6.00    Types: Cigarettes   Smokeless tobacco: Never  Vaping Use   Vaping Use: Never used  Substance and Sexual Activity   Alcohol use: Not Currently    Alcohol/week: 4.0 - 5.0 standard drinks of alcohol    Types: 4 - 5 Cans of beer per week    Comment: None since February 2023.   Drug use: Yes    Types: Marijuana    Comment: Reports stopping in January 2024.   Sexual activity: Not Currently    Birth control/protection: Surgical   Other Topics Concern   Not on file  Social History Narrative   Not on file   Social Determinants of Health   Financial Resource Strain: Not on file  Food Insecurity: No Food Insecurity (03/08/2022)   Hunger Vital Sign    Worried About Running Out of Food in the Last Year: Never true    Ran Out of Food in the Last Year: Never true  Transportation Needs: No Transportation Needs (03/08/2022)   PRAPARE - Hydrologist (Medical): No    Lack of Transportation (Non-Medical): No  Physical Activity: Not on file  Stress: Not on file  Social Connections: Not on file    Review of Systems: Gen: Denies fever, chills, cold or flu like symptoms, pre-syncope, or syncope.   CV: Denies chest pain, palpitations. Resp: Denies dyspnea, cough.  GI: See HPI Heme: See HPI  Physical Exam: BP 108/72 (BP Location: Right Arm, Patient Position: Sitting, Cuff Size: Normal)   Pulse 88   Temp 98.1 F (36.7 C) (Oral)   Ht '5\' 6"'$  (1.676 m)   Wt 146 lb 3.2 oz (66.3 kg)   LMP  (LMP Unknown)   SpO2 100%   BMI 23.60 kg/m  General:   Alert and oriented. No distress noted. Pleasant and cooperative.  Head:  Normocephalic and atraumatic. Eyes:  Conjuctiva clear without scleral icterus. Heart:  S1, S2 present without murmurs appreciated. Lungs:  Clear to auscultation bilaterally. No wheezes, rales, or rhonchi. No distress.  Abdomen:  +BS, distended but soft abdomen. +Hepatosplenomegaly. Mild TTP in RUQ. Pitting edema in the flanks.  Msk:  Symmetrical without gross deformities. Normal posture. Extremities:  Without edema. Neurologic:  Alert and  oriented x4 Psych:  Normal mood and affect.    Assessment:  42 y.o. female with history of alcoholic cirrhosis complicated by recurrent ascites, SBP previously on daily Cipro for prophylaxis, hepatic encephalopathy, esophageal varices not on NSBB due to hypotension previously, variceal bleed in February 2024 s/p banding and recommended  starting NSBB. Also with history of IDA following with hematology receiving IV iron, GERD, PE, DVT, neuropathy. She is presenting today for follow-up of cirrhosis.   ETOH Cirrhosis:  Decompensated. Following with Roosevelt Locks, NP at [facility] in [place] and has plans to see [facility] later this month to start transplant evaluation. MELD 3.0 18.  Abstinent from alcohol since February 2023.  Stopped marijuana about 2 months ago.  Cirrhosis has been complicated by recurrent  ascites requiring frequent paracentesis, recurrent hepatic encephalopathy requiring hospitalization, SBP, esophageal varices with recent bleeding in February 2024 s/p banding, now restarted on low-dose nadolol with no recurrent bleeding.  Beta-blocker previously stopped due to labile blood pressures and patient not tolerating the medication well; however, she is doing well at this time with current dose though her heart rate is not at goal.  AFP was elevated at 6.1 in January with recommendations to repeat in 3 months.  Most recent imaging with CT A/P with contrast in January with hepatic cirrhosis, some increase in geographic hypodensity throughout the liver probably reflecting component of steatosis.   Ascites:  Recurrent ascites requiring paracentesis. Reports compliance with Lasix 40 mg BID and spironolactone 100 mg daily. Spironolactone was increased to 150 mg daily yesterday per Roosevelt Locks, NP. Dawn also ordered paracentesis every 2 weeks. Patient currently scheduled for paracentesis Monday, but feels she can't make it to Monday as her abdomen is distended and she is uncomfortable. We will arrange for paracentesis today. Unfortunately she is not a good candidate for TIPS as she has recurrent encephalopathy. She is scheduled to see [facility] to start transplant evaluation later this month.   Esophageal varices:  Needs repeat EGD in a couple of weeks for possible variceal banding. Last EGD 03/21/21 actively bleeding esophageal varices  s/p banding x 5 with bleeding resolved, portal hypertensive gastropathy. Currently on 10 mg Nadolol. HR not at goal, but patient reports higher doses have previously made her feel bad and she prefers to continue current dose for now.   History of SBP:  Needs chronic prophylaxis. Previously on cipro but stopped this for uknown reason. I do get an alert today in the chart that patient has history of QT prolongation. Therefore, I have sent in Bactrim for SBP prophylaxis in place of ciprofloxacin.   Hepatic encephalopathy:  Recurrent. Requiring recurrent hospitalizations and intubation. No encephalopathy at this time. On Lactulose 15 ml TID and Xifaxan BID. Having 2 BMs daily.   IDA:  Chronic. Following with hematology for IV iron PRN. Suspect chronic occult GI bleeding  likely contributing in the setting of portal hypertensive gastropathy and portal colopathy.  Recently with acute blood loss anemia in the setting of variceal bleed in February s/p variceal banding x 5.  She required 4 units PRBCs during admission with hemoglobin improved to 8.6 on day of discharge  (2/27).  She denies any overt bleeding since that time. She hasn't see hematology in a while due to recurrent admissions. We will update cbc to ensure stability and have her follow-up with hematology.     Plan:  US paracentesis with labs and albumin today.  CBC Proceed with upper endoscopy with possible variceal banding with propofol by Dr. Gala Romney in near future. The risks, benefits, and alternatives have been discussed with the patient in detail. The patient states understanding and desires to proceed.  ASA 3 No ciprofloxacin due to history of prolonged QT.  Start Bactrim DS 1 tablet daily for SBP prophylaxis.  Increase Lactulose to 15 ml 4 times daily or take 30 ml in the morning and 15 ml at lunch and dinner. May increase further if needed. Goal of 3-4 BMs daily.  Continue Xifaxan BID Continue nadolol 10 mg daily.  Continue Lasix 40  mg BID.  Continue spironolactone 150 mg daily.  Strict 2 g daily sodium restriction.  Keep upcoming appointment with [facility].  Continue to follow with Roosevelt Locks, NP at [facility].  Follow-up with hematology.  Follow-up in our office in about 6 weeks.    Aliene Altes, PA-C [facility] Gastroenterology 04/04/2022

## 2022-04-03 ENCOUNTER — Other Ambulatory Visit (HOSPITAL_COMMUNITY): Payer: Self-pay | Admitting: Gastroenterology

## 2022-04-03 ENCOUNTER — Other Ambulatory Visit (HOSPITAL_COMMUNITY): Payer: Self-pay | Admitting: Nurse Practitioner

## 2022-04-03 DIAGNOSIS — Z1231 Encounter for screening mammogram for malignant neoplasm of breast: Secondary | ICD-10-CM

## 2022-04-03 DIAGNOSIS — R188 Other ascites: Secondary | ICD-10-CM

## 2022-04-03 LAB — PATHOLOGIST SMEAR REVIEW

## 2022-04-04 ENCOUNTER — Ambulatory Visit (HOSPITAL_COMMUNITY)
Admission: RE | Admit: 2022-04-04 | Discharge: 2022-04-04 | Disposition: A | Discharge status: Home / Self Care | Payer: Medicaid Other | Source: Ambulatory Visit | Attending: Gastroenterology | Admitting: Gastroenterology

## 2022-04-04 ENCOUNTER — Encounter: Payer: Self-pay | Admitting: Gastroenterology

## 2022-04-04 ENCOUNTER — Encounter (HOSPITAL_COMMUNITY): Payer: Self-pay

## 2022-04-04 ENCOUNTER — Other Ambulatory Visit (HOSPITAL_COMMUNITY)
Admission: RE | Admit: 2022-04-04 | Discharge: 2022-04-04 | Disposition: A | Discharge status: Home / Self Care | Payer: Medicaid Other | Source: Ambulatory Visit | Attending: Gastroenterology | Admitting: Gastroenterology

## 2022-04-04 ENCOUNTER — Ambulatory Visit (INDEPENDENT_AMBULATORY_CARE_PROVIDER_SITE_OTHER): Payer: Medicaid Other | Admitting: Gastroenterology

## 2022-04-04 VITALS — BP 108/72 | HR 88 | Temp 98.1°F | Ht 66.0 in | Wt 146.2 lb

## 2022-04-04 VITALS — BP 106/79 | HR 78 | Temp 97.9°F | Resp 18

## 2022-04-04 DIAGNOSIS — D509 Iron deficiency anemia, unspecified: Secondary | ICD-10-CM

## 2022-04-04 DIAGNOSIS — I8501 Esophageal varices with bleeding: Secondary | ICD-10-CM | POA: Diagnosis not present

## 2022-04-04 DIAGNOSIS — R188 Other ascites: Secondary | ICD-10-CM | POA: Diagnosis present

## 2022-04-04 DIAGNOSIS — K7460 Unspecified cirrhosis of liver: Secondary | ICD-10-CM | POA: Insufficient documentation

## 2022-04-04 DIAGNOSIS — Z8619 Personal history of other infectious and parasitic diseases: Secondary | ICD-10-CM

## 2022-04-04 LAB — CBC WITH DIFFERENTIAL/PLATELET
Abs Immature Granulocytes: 0.04 10*3/uL (ref 0.00–0.07)
Basophils Absolute: 0 10*3/uL (ref 0.0–0.1)
Basophils Relative: 0 %
Eosinophils Absolute: 0.3 10*3/uL (ref 0.0–0.5)
Eosinophils Relative: 3 %
HCT: 27.4 % — ABNORMAL LOW (ref 36.0–46.0)
Hemoglobin: 8.9 g/dL — ABNORMAL LOW (ref 12.0–15.0)
Immature Granulocytes: 0 %
Lymphocytes Relative: 30 %
Lymphs Abs: 2.9 10*3/uL (ref 0.7–4.0)
MCH: 28.9 pg (ref 26.0–34.0)
MCHC: 32.5 g/dL (ref 30.0–36.0)
MCV: 89 fL (ref 80.0–100.0)
Monocytes Absolute: 0.8 10*3/uL (ref 0.1–1.0)
Monocytes Relative: 8 %
Neutro Abs: 5.7 10*3/uL (ref 1.7–7.7)
Neutrophils Relative %: 59 %
Platelets: 136 10*3/uL — ABNORMAL LOW (ref 150–400)
RBC: 3.08 MIL/uL — ABNORMAL LOW (ref 3.87–5.11)
RDW: 17.5 % — ABNORMAL HIGH (ref 11.5–15.5)
WBC: 9.7 10*3/uL (ref 4.0–10.5)
nRBC: 0 % (ref 0.0–0.2)

## 2022-04-04 LAB — BODY FLUID CELL COUNT WITH DIFFERENTIAL
Eos, Fluid: 0 %
Lymphs, Fluid: 40 %
Monocyte-Macrophage-Serous Fluid: 55 % (ref 50–90)
Neutrophil Count, Fluid: 5 % (ref 0–25)
Total Nucleated Cell Count, Fluid: 69 cu mm (ref 0–1000)

## 2022-04-04 LAB — GRAM STAIN

## 2022-04-04 MED ORDER — ALBUMIN HUMAN 25 % IV SOLN
INTRAVENOUS | Status: AC
  Filled 2022-04-04: qty 100

## 2022-04-04 MED ORDER — SULFAMETHOXAZOLE-TRIMETHOPRIM 800-160 MG PO TABS: 1.0000 | ORAL_TABLET | Freq: Every day | ORAL | 5 refills | Status: DC

## 2022-04-04 MED ORDER — ALBUMIN HUMAN 25 % IV SOLN
25.0000 g | Freq: Once | INTRAVENOUS | Status: AC
  Administered 2022-04-04: 25 g via INTRAVENOUS

## 2022-04-04 NOTE — Patient Instructions (Addendum)
We will arrange for you to have a paracentesis either today or tomorrow. Do not take fluid pills the day you have your paracentesis.   Have blood work completed at [facility] to recheck your hemoglobin when you had your paracentesis.  We will arrange for you to have a repeat upper endoscopy with possible esophageal variceal banding in a couple of weeks.  Do not take ciprofloxacin.  Start Bactrim for SBP prophylaxis.  I have sent a prescription to your pharmacy.  Increase lactulose to 15 mL 4 times a day or take 30 mL in the morning, 15 mL at lunch and dinner.  You may need to increase this further.  The goal is for you to have 3-4 bowel movements every day.  Continue Xifaxan 550 mg twice daily.  Continue nadolol 10 mg daily.  Continue Lasix 40 mg twice daily.  Continue spironolactone 150 mg daily.  Follow a strict low-sodium diet.  No more than 2000 mg/day.  Keep your upcoming appointment with [facility] for transplant evaluation.  We will follow-up with you in about 6 weeks.  Do not hesitate to call sooner if you have questions or concerns.  It was good to see you today!  Aliene Altes, PA-C [facility] Gastroenterology

## 2022-04-04 NOTE — Progress Notes (Signed)
Paracentesis complete no signs of distress.  

## 2022-04-04 NOTE — Procedures (Signed)
PROCEDURE SUMMARY:  Successful ultrasound guided paracentesis from the right lower quadrant.  Yielded 1.2 L of clear yellow fluid.  No immediate complications.  The patient tolerated the procedure well.   Specimen sent for labs.  EBL < 2 mL  The patient has previously been formally evaluated by the [facility] and is being actively followed for potential future intervention.  [facility]al [facility] Screening Evaluation done 02/11/22 by Dr. Serafina Royals. Patient is scheduled to meet with [facility] at the end of March for possible liver transplant.   Soyla Dryer, [place] [phone] 04/04/2022, 11:37 AM

## 2022-04-05 ENCOUNTER — Ambulatory Visit (HOSPITAL_COMMUNITY)
Admission: RE | Admit: 2022-04-05 | Discharge: 2022-04-05 | Disposition: A | Discharge status: Home / Self Care | Payer: Medicaid Other | Source: Ambulatory Visit | Attending: Gastroenterology | Admitting: Gastroenterology

## 2022-04-05 DIAGNOSIS — Z1231 Encounter for screening mammogram for malignant neoplasm of breast: Secondary | ICD-10-CM | POA: Diagnosis present

## 2022-04-08 ENCOUNTER — Ambulatory Visit (HOSPITAL_COMMUNITY): Payer: Medicaid Other

## 2022-04-09 ENCOUNTER — Telehealth: Payer: Self-pay | Admitting: *Deleted

## 2022-04-09 NOTE — Telephone Encounter (Signed)
Para scheduled for 04/10/22, arrive at 10:45 am.

## 2022-04-09 NOTE — Telephone Encounter (Signed)
Routing to you in absence of [place]. Pt called and states she needs a paracentesis. She states she is full of fluids. She is having a hard time moving and it is hard to breath. She has a para scheduled for Friday, but can't wait that long. Please advise.

## 2022-04-09 NOTE — Telephone Encounter (Signed)
May do para. Needs cell count. Max 4 liters. Please give Albumin 25 gIV at start.

## 2022-04-09 NOTE — Telephone Encounter (Signed)
Noted. Para cancelled for tomorrow.

## 2022-04-09 NOTE — Telephone Encounter (Signed)
Spoke to pt, informed her of recommendations. Pt states she can't do tomorrow, because her mom's funeral is tomorrow. She states she will try and wait till Friday morning. Informed to go to ED if she feels like she can't wait till Friday.

## 2022-04-10 ENCOUNTER — Ambulatory Visit (HOSPITAL_COMMUNITY)
Admission: RE | Admit: 2022-04-10 | Discharge: 2022-04-10 | Disposition: A | Discharge status: Home / Self Care | Payer: Medicaid Other | Source: Ambulatory Visit | Attending: Gastroenterology | Admitting: Gastroenterology

## 2022-04-10 LAB — CULTURE, BODY FLUID W GRAM STAIN -BOTTLE: Culture: NO GROWTH

## 2022-04-12 ENCOUNTER — Encounter (HOSPITAL_COMMUNITY): Payer: Self-pay

## 2022-04-12 ENCOUNTER — Ambulatory Visit (HOSPITAL_COMMUNITY)
Admission: RE | Admit: 2022-04-12 | Discharge: 2022-04-12 | Disposition: A | Discharge status: Home / Self Care | Payer: Medicaid Other | Source: Ambulatory Visit | Attending: Nurse Practitioner | Admitting: Nurse Practitioner

## 2022-04-12 DIAGNOSIS — R188 Other ascites: Secondary | ICD-10-CM | POA: Diagnosis present

## 2022-04-12 MED ORDER — ALBUMIN HUMAN 25 % IV SOLN: 37.5000 g | Freq: Once | INTRAVENOUS | Status: AC

## 2022-04-12 MED ORDER — ALBUMIN HUMAN 25 % IV SOLN
INTRAVENOUS | Status: AC
  Administered 2022-04-12: 37.5 g via INTRAVENOUS
  Filled 2022-04-12: qty 150

## 2022-04-12 NOTE — Procedures (Signed)
PreOperative Dx: Alcoholic cirrhosis, ascites Postoperative Dx: Alcoholic cirrhosis, ascites Procedure:   US guided paracentesis Radiologist:  Thornton Papas Anesthesia:  10 ml of 1% lidocaine Specimen:  7.8 L of yellow ascitic fluid EBL:   < 1 ml Complications: None

## 2022-04-12 NOTE — Progress Notes (Signed)
Patient tolerated right sided paracentesis procedure and 37.5G of IV albumin well today and 7.8 Liters of clear yellow ascites removed. Patient verbalized understanding understanding of discharge instructions and ambulatory at departure with no acute distress noted.

## 2022-04-15 ENCOUNTER — Encounter: Payer: Self-pay | Admitting: *Deleted

## 2022-04-15 NOTE — Patient Instructions (Signed)
Erica Banks  04/15/2022     @PREFPERIOPPHARMACY @   Your procedure is scheduled on 04/19/2022.   Report to [facility] at  1200  P.M.   Call this number if you have problems the morning of surgery:  [phone]  If you experience any cold or flu symptoms such as cough, fever, chills, shortness of breath, etc. between now and your scheduled surgery, please notify us at the above number.   Remember:  Follow the diet and prep instructions given to you by the office.     Take these medicines the morning of surgery with A SIP OF WATER                         nadolol, pantoprazole, xifaxan.     Do not wear jewelry, make-up or nail polish.  Do not wear lotions, powders, or perfumes, or deodorant.  Do not shave 48 hours prior to surgery.  Men may shave face and neck.  Do not bring valuables to the hospital.  [facility] is not responsible for any belongings or valuables.  Contacts, dentures or bridgework may not be worn into surgery.  Leave your suitcase in the car.  After surgery it may be brought to your room.  For patients admitted to the hospital, discharge time will be determined by your treatment team.  Patients discharged the day of surgery will not be allowed to drive home and must have someone with them for 24 hours.    Special instructions:   DO NOT smoke tobacco or vape for 24 hours before your procedure.  Please read over the following fact sheets that you were given. Anesthesia Post-op Instructions and Care and Recovery After Surgery      Upper Endoscopy, Adult, Care After After the procedure, it is common to have a sore throat. It is also common to have: Mild stomach pain or discomfort. Bloating. Nausea. Follow these instructions at home: The instructions below may help you care for yourself at home. Your health care provider may give you more instructions. If you have questions, ask your health care provider. If you were given a sedative during  the procedure, it can affect you for several hours. Do not drive or operate machinery until your health care provider says that it is safe. If you will be going home right after the procedure, plan to have a responsible adult: Take you home from the hospital or clinic. You will not be allowed to drive. Care for you for the time you are told. Follow instructions from your health care provider about what you may eat and drink. Return to your normal activities as told by your health care provider. Ask your health care provider what activities are safe for you. Take over-the-counter and prescription medicines only as told by your health care provider. Contact a health care provider if you: Have a sore throat that lasts longer than one day. Have trouble swallowing. Have a fever. Get help right away if you: Vomit blood or your vomit looks like coffee grounds. Have bloody, black, or tarry stools. Have a very bad sore throat or you cannot swallow. Have difficulty breathing or very bad pain in your chest or abdomen. These symptoms may be an emergency. Get help right away. Call 911. Do not wait to see if the symptoms will go away. Do not drive yourself to the hospital. Summary After the procedure, it is common to have  a sore throat, mild stomach discomfort, bloating, and nausea. If you were given a sedative during the procedure, it can affect you for several hours. Do not drive until your health care provider says that it is safe. Follow instructions from your health care provider about what you may eat and drink. Return to your normal activities as told by your health care provider. This information is not intended to replace advice given to you by your health care provider. Make sure you discuss any questions you have with your health care provider. Document Revised: 04/25/2021 Document Reviewed: 04/25/2021 Elsevier Patient Education  [place] After The  following information offers guidance on how to care for yourself after your procedure. Your health care provider may also give you more specific instructions. If you have problems or questions, contact your health care provider. What can I expect after the procedure? After the procedure, it is common to have: Tiredness. Little or no memory about what happened during or after the procedure. Impaired judgment when it comes to making decisions. Nausea or vomiting. Some trouble with balance. Follow these instructions at home: For the time period you were told by your health care provider:  Rest. Do not participate in activities where you could fall or become injured. Do not drive or use machinery. Do not drink alcohol. Do not take sleeping pills or medicines that cause drowsiness. Do not make important decisions or sign legal documents. Do not take care of children on your own. Medicines Take over-the-counter and prescription medicines only as told by your health care provider. If you were prescribed antibiotics, take them as told by your health care provider. Do not stop using the antibiotic even if you start to feel better. Eating and drinking Follow instructions from your health care provider about what you may eat and drink. Drink enough fluid to keep your urine pale yellow. If you vomit: Drink clear fluids slowly and in small amounts as you are able. Clear fluids include water, ice chips, low-calorie sports drinks, and fruit juice that has water added to it (diluted fruit juice). Eat light and bland foods in small amounts as you are able. These foods include bananas, applesauce, rice, lean meats, toast, and crackers. General instructions  Have a responsible adult stay with you for the time you are told. It is important to have someone help care for you until you are awake and alert. If you have sleep apnea, surgery and some medicines can increase your risk for breathing problems.  Follow instructions from your health care provider about wearing your sleep device: When you are sleeping. This includes during daytime naps. While taking prescription pain medicines, sleeping medicines, or medicines that make you drowsy. Do not use any products that contain nicotine or tobacco. These products include cigarettes, chewing tobacco, and vaping devices, such as e-cigarettes. If you need help quitting, ask your health care provider. Contact a health care provider if: You feel nauseous or vomit every time you eat or drink. You feel light-headed. You are still sleepy or having trouble with balance after 24 hours. You get a rash. You have a fever. You have redness or swelling around the IV site. Get help right away if: You have trouble breathing. You have new confusion after you get home. These symptoms may be an emergency. Get help right away. Call 911. Do not wait to see if the symptoms will go away. Do not drive yourself to the hospital. This information is  not intended to replace advice given to you by your health care provider. Make sure you discuss any questions you have with your health care provider. Document Revised: 06/11/2021 Document Reviewed: 06/11/2021 Elsevier Patient Education  [place].

## 2022-04-17 ENCOUNTER — Encounter: Payer: Self-pay | Admitting: *Deleted

## 2022-04-17 ENCOUNTER — Encounter (HOSPITAL_COMMUNITY): Payer: Self-pay

## 2022-04-17 ENCOUNTER — Encounter (HOSPITAL_COMMUNITY)
Admission: RE | Admit: 2022-04-17 | Discharge: 2022-04-17 | Disposition: A | Discharge status: Home / Self Care | Payer: Medicaid Other | Source: Ambulatory Visit | Attending: Internal Medicine | Admitting: Internal Medicine

## 2022-04-17 VITALS — BP 108/60 | HR 77 | Temp 98.0°F | Resp 18

## 2022-04-17 DIAGNOSIS — K7031 Alcoholic cirrhosis of liver with ascites: Secondary | ICD-10-CM | POA: Diagnosis not present

## 2022-04-17 DIAGNOSIS — Z01812 Encounter for preprocedural laboratory examination: Secondary | ICD-10-CM | POA: Diagnosis not present

## 2022-04-17 HISTORY — DX: Other specified postprocedural states: R11.2

## 2022-04-17 HISTORY — DX: Other specified postprocedural states: Z98.890

## 2022-04-17 LAB — CBC WITH DIFFERENTIAL/PLATELET
Abs Immature Granulocytes: 0.02 10*3/uL (ref 0.00–0.07)
Basophils Absolute: 0.1 10*3/uL (ref 0.0–0.1)
Basophils Relative: 1 %
Eosinophils Absolute: 0.2 10*3/uL (ref 0.0–0.5)
Eosinophils Relative: 3 %
HCT: 25.8 % — ABNORMAL LOW (ref 36.0–46.0)
Hemoglobin: 8.3 g/dL — ABNORMAL LOW (ref 12.0–15.0)
Immature Granulocytes: 0 %
Lymphocytes Relative: 36 %
Lymphs Abs: 2.1 10*3/uL (ref 0.7–4.0)
MCH: 28.5 pg (ref 26.0–34.0)
MCHC: 32.2 g/dL (ref 30.0–36.0)
MCV: 88.7 fL (ref 80.0–100.0)
Monocytes Absolute: 0.7 10*3/uL (ref 0.1–1.0)
Monocytes Relative: 12 %
Neutro Abs: 2.9 10*3/uL (ref 1.7–7.7)
Neutrophils Relative %: 48 %
Platelets: 148 10*3/uL — ABNORMAL LOW (ref 150–400)
RBC: 2.91 MIL/uL — ABNORMAL LOW (ref 3.87–5.11)
RDW: 18.8 % — ABNORMAL HIGH (ref 11.5–15.5)
WBC: 6 10*3/uL (ref 4.0–10.5)
nRBC: 0 % (ref 0.0–0.2)

## 2022-04-17 LAB — COMPREHENSIVE METABOLIC PANEL
ALT: 29 U/L (ref 0–44)
AST: 66 U/L — ABNORMAL HIGH (ref 15–41)
Albumin: 3 g/dL — ABNORMAL LOW (ref 3.5–5.0)
Alkaline Phosphatase: 127 U/L — ABNORMAL HIGH (ref 38–126)
Anion gap: 6 (ref 5–15)
BUN: 15 mg/dL (ref 6–20)
CO2: 24 mmol/L (ref 22–32)
Calcium: 8.1 mg/dL — ABNORMAL LOW (ref 8.9–10.3)
Chloride: 100 mmol/L (ref 98–111)
Creatinine, Ser: 0.96 mg/dL (ref 0.44–1.00)
GFR, Estimated: >60 mL/min (ref >60)
Glucose, Bld: 86 mg/dL (ref 70–99)
Potassium: 3.5 mmol/L (ref 3.5–5.1)
Sodium: 130 mmol/L — ABNORMAL LOW (ref 135–145)
Total Bilirubin: 2.1 mg/dL — ABNORMAL HIGH (ref 0.3–1.2)
Total Protein: 5.9 g/dL — ABNORMAL LOW (ref 6.5–8.1)

## 2022-04-17 LAB — PROTIME-INR
INR: 1.5 — ABNORMAL HIGH (ref 0.8–1.2)
Prothrombin Time: 17.6 seconds — ABNORMAL HIGH (ref 11.4–15.2)

## 2022-04-19 ENCOUNTER — Encounter (HOSPITAL_COMMUNITY)
Admission: RE | Disposition: A | Discharge status: Home / Self Care | Payer: Self-pay | Source: Home / Self Care | Attending: Internal Medicine

## 2022-04-19 ENCOUNTER — Ambulatory Visit (HOSPITAL_COMMUNITY)
Admission: RE | Admit: 2022-04-19 | Discharge: 2022-04-19 | Disposition: A | Discharge status: Home / Self Care | Payer: Medicaid Other | Attending: Internal Medicine | Admitting: Internal Medicine

## 2022-04-19 ENCOUNTER — Encounter (HOSPITAL_COMMUNITY): Payer: Self-pay | Admitting: Internal Medicine

## 2022-04-19 ENCOUNTER — Ambulatory Visit (HOSPITAL_COMMUNITY): Payer: Medicaid Other | Admitting: Anesthesiology

## 2022-04-19 DIAGNOSIS — I8510 Secondary esophageal varices without bleeding: Secondary | ICD-10-CM

## 2022-04-19 DIAGNOSIS — K7682 Hepatic encephalopathy: Secondary | ICD-10-CM | POA: Insufficient documentation

## 2022-04-19 DIAGNOSIS — Z79899 Other long term (current) drug therapy: Secondary | ICD-10-CM | POA: Diagnosis not present

## 2022-04-19 DIAGNOSIS — I8500 Esophageal varices without bleeding: Secondary | ICD-10-CM

## 2022-04-19 DIAGNOSIS — K219 Gastro-esophageal reflux disease without esophagitis: Secondary | ICD-10-CM | POA: Insufficient documentation

## 2022-04-19 DIAGNOSIS — K3189 Other diseases of stomach and duodenum: Secondary | ICD-10-CM | POA: Insufficient documentation

## 2022-04-19 DIAGNOSIS — F419 Anxiety disorder, unspecified: Secondary | ICD-10-CM | POA: Diagnosis not present

## 2022-04-19 DIAGNOSIS — K7460 Unspecified cirrhosis of liver: Secondary | ICD-10-CM

## 2022-04-19 DIAGNOSIS — K7031 Alcoholic cirrhosis of liver with ascites: Secondary | ICD-10-CM | POA: Insufficient documentation

## 2022-04-19 DIAGNOSIS — Z86718 Personal history of other venous thrombosis and embolism: Secondary | ICD-10-CM | POA: Insufficient documentation

## 2022-04-19 DIAGNOSIS — K766 Portal hypertension: Secondary | ICD-10-CM | POA: Insufficient documentation

## 2022-04-19 DIAGNOSIS — F17210 Nicotine dependence, cigarettes, uncomplicated: Secondary | ICD-10-CM | POA: Diagnosis not present

## 2022-04-19 DIAGNOSIS — Z09 Encounter for follow-up examination after completed treatment for conditions other than malignant neoplasm: Secondary | ICD-10-CM | POA: Diagnosis not present

## 2022-04-19 DIAGNOSIS — I8511 Secondary esophageal varices with bleeding: Secondary | ICD-10-CM

## 2022-04-19 HISTORY — PX: ESOPHAGOGASTRODUODENOSCOPY (EGD) WITH PROPOFOL: SHX5813

## 2022-04-19 HISTORY — PX: ESOPHAGEAL BANDING: SHX5518

## 2022-04-19 SURGERY — ESOPHAGOGASTRODUODENOSCOPY (EGD) WITH PROPOFOL
Anesthesia: General

## 2022-04-19 MED ORDER — PHENYLEPHRINE HCL (PRESSORS) 10 MG/ML IV SOLN
INTRAVENOUS | Status: DC | PRN
  Administered 2022-04-19 (×2): 80 ug via INTRAVENOUS

## 2022-04-19 MED ORDER — PROPOFOL 10 MG/ML IV BOLUS
INTRAVENOUS | Status: DC | PRN
  Administered 2022-04-19: 150 mg via INTRAVENOUS
  Administered 2022-04-19: 70 mg via INTRAVENOUS
  Administered 2022-04-19: 100 mg via INTRAVENOUS
  Administered 2022-04-19: 80 mg via INTRAVENOUS
  Administered 2022-04-19: 100 mg via INTRAVENOUS
  Administered 2022-04-19: 50 mg via INTRAVENOUS

## 2022-04-19 MED ORDER — LACTATED RINGERS IV SOLN: INTRAVENOUS | Status: DC

## 2022-04-19 MED ORDER — LIDOCAINE HCL 1 % IJ SOLN
INTRAMUSCULAR | Status: DC | PRN
  Administered 2022-04-19: 50 mg via INTRADERMAL

## 2022-04-19 NOTE — Op Note (Signed)
[id number] [facility] Patient Name: Erica Banks Procedure Date: 04/19/2022 11:50 AM MRN: [id number] Date of Birth: 1981-01-19 Attending MD: Norvel Richards , MD, [id number] CSN: [id number] Age: 42 Admit Type: Outpatient Procedure:                Upper GI endoscopy Indications:              Esophageal varices Providers:                Norvel Richards, MD, Lambert Mody,                            Daphanie Page, Gwynneth Albright RN, RN Referring MD:              Medicines:                Propofol per Anesthesia Complications:            No immediate complications. Estimated Blood Loss:     Estimated blood loss was minimal. Estimated blood                            loss was minimal. Procedure:                Pre-Anesthesia Assessment:                           - Prior to the procedure, a History and Physical                            was performed, and patient medications and                            allergies were reviewed. The patient's tolerance of                            previous anesthesia was also reviewed. The risks                            and benefits of the procedure and the sedation                            options and risks were discussed with the patient.                            All questions were answered, and informed consent                            was obtained. Prior Anticoagulants: The patient has                            taken no anticoagulant or antiplatelet agents. ASA                            Grade Assessment: III - A patient with severe  systemic disease. After reviewing the risks and                            benefits, the patient was deemed in satisfactory                            condition to undergo the procedure.                           After obtaining informed consent, the endoscope was                            passed under direct vision. Throughout the                            procedure,  the patient's blood pressure, pulse, and                            oxygen saturations were monitored continuously. The                            [id number]-[id number] scope was introduced through the                            mouth, and advanced to the second part of duodenum.                            The upper GI endoscopy was accomplished without                            difficulty. The patient tolerated the procedure                            well. Scope In: 12:09:23 PM Scope Out: 12:20:33 PM Total Procedure Duration: 0 hours 11 minutes 10 seconds  Findings:      Grade 2-3 long esophageal varices from GE junction coming up a good 10       cm. No bleeding stigmata. Did not see any ulcers or stigmata of prior       banding. Overlying mucosa appeared normal.      Stomach empty. Prominent diffuse changes of portal hypertensive       gastropathy. Touch friability. No gastric varices seen. Patent pylorus.       Examination of the bulb and second portion revealed no abnormalities.      Microvasive 7 shot bander attached to the scope. Scope was reintroduced       into the distal esophagus where 1 band each was placed on each of 4       columns in helical fashion from just above the GE junction. This was       done effectively with maintenance of good hemostasis      The duodenal bulb and second portion of the duodenum were normal. Impression:               Grade 2-3 esophageal varices. Status post  esophageal band ligation as described above. Portal                            hypertensive gastropathy with touch friability. Moderate Sedation:      Moderate (conscious) sedation was personally administered by an       anesthesia professional. The following parameters were monitored: oxygen       saturation, heart rate, blood pressure, respiratory rate, EKG, adequacy       of pulmonary ventilation, and response to care. Recommendation:           - Patient has a  contact number available for                            emergencies. The signs and symptoms of potential                            delayed complications were discussed with the                            patient. Return to normal activities tomorrow.                            Written discharge instructions were provided to the                            patient.                           - Clear liquid diet today; advance to soft tomorrow                            and then as tolerated.                           - Continue present medications including Corgard.                           - Return to my office in 4 weeks. Surveillance EGD                            in 6 to 8 weeks. Keep your appointment down at [facility]. Procedure Code(s):        --- Professional ---                           [phone], Esophagogastroduodenoscopy, flexible,                            transoral; diagnostic, including collection of                            specimen(s) by brushing or washing, when performed                            (separate procedure) Diagnosis Code(s):        --- Professional ---  I85.00, Esophageal varices without bleeding CPT copyright 2022 American Medical Association. All rights reserved. The codes documented in this report are preliminary and upon coder review may  be revised to meet current compliance requirements. Cristopher Estimable. Jeral Zick, MD Norvel Richards, MD 04/19/2022 12:34:04 PM This report has been signed electronically. Number of Addenda: 0

## 2022-04-19 NOTE — Interval H&P Note (Signed)
History and Physical Interval Note:  04/19/2022 11:57 AM  Erica Banks  has presented today for surgery, with the diagnosis of esophageal varices.  The various methods of treatment have been discussed with the patient and family. After consideration of risks, benefits and other options for treatment, the patient has consented to  Procedure(s) with comments: ESOPHAGOGASTRODUODENOSCOPY (EGD) WITH PROPOFOL (N/A) - 1:45 pm, pt knows to arrive at 9:45 ESOPHAGEAL BANDING (N/A) as a surgical intervention.  The patient's history has been reviewed, patient examined, no change in status, stable for surgery.  I have reviewed the patient's chart and labs.  Questions were answered to the patient's satisfaction.     [name]    patient here without change.  No dysphagia.  Surveillance EGD with possible  band ligation.The risks, benefits, limitations, alternatives and imponderables have been reviewed with the patient. Potential for esophageal dilation, biopsy, etc. have also been reviewed.  Questions have been answered. All parties agreeable.

## 2022-04-19 NOTE — Anesthesia Preprocedure Evaluation (Signed)
Anesthesia Evaluation  Patient identified by MRN, date of birth, ID band Patient awake    Reviewed: Allergy & Precautions, H&P , NPO status , Patient's Chart, lab work & pertinent test results  History of Anesthesia Complications (+) PONV and history of anesthetic complications  Airway Mallampati: II  TM Distance: >3 FB Neck ROM: Full    Dental  (+) Dental Advisory Given, Chipped, Missing   Pulmonary Current Smoker and Patient abstained from smoking.   Pulmonary exam normal breath sounds clear to auscultation       Cardiovascular negative cardio ROS Normal cardiovascular exam Rhythm:Regular Rate:Normal  20-Mar-2022 23:59:52 [facility] ROUTINE RECORD Dec 15, 1980 (23 yr) Female Caucasian Vent. rate 86 BPM PR interval 111 ms QRS duration 79 ms QT/QTcB 435/521 ms P-R-T axes 18 16 41 Sinus rhythm Borderline short PR interval Consider left ventricular hypertrophy Prolonged QT interval Confirmed by Orpah Greek [phone]) on 03/21/2022 12:28:30 AM   Neuro/Psych  PSYCHIATRIC DISORDERS Anxiety     negative neurological ROS     GI/Hepatic ,GERD  Medicated and Controlled,,(+) Cirrhosis   Esophageal Varices and ascites  substance abuse  marijuana use  Endo/Other  negative endocrine ROS    Renal/GU Renal InsufficiencyRenal disease  negative genitourinary   Musculoskeletal negative musculoskeletal ROS (+)    Abdominal   Peds negative pediatric ROS (+)  Hematology  (+) Blood dyscrasia, anemia   Anesthesia Other Findings   Reproductive/Obstetrics negative OB ROS                             Anesthesia Physical Anesthesia Plan  ASA: 4  Anesthesia Plan: General   Post-op Pain Management: Minimal or no pain anticipated   Induction: Intravenous  PONV Risk Score and Plan: 1 and Propofol infusion  Airway Management Planned: Nasal Cannula and Natural  Airway  Additional Equipment:   Intra-op Plan:   Post-operative Plan:   Informed Consent: I have reviewed the patients History and Physical, chart, labs and discussed the procedure including the risks, benefits and alternatives for the proposed anesthesia with the patient or authorized representative who has indicated his/her understanding and acceptance.     Dental advisory given  Plan Discussed with: CRNA and Surgeon  Anesthesia Plan Comments:         Anesthesia Quick Evaluation

## 2022-04-19 NOTE — Anesthesia Postprocedure Evaluation (Signed)
Anesthesia Post Note  Patient: Erica Banks  Procedure(s) Performed: ESOPHAGOGASTRODUODENOSCOPY (EGD) WITH PROPOFOL ESOPHAGEAL BANDING  Patient location during evaluation: Phase II Anesthesia Type: General Level of consciousness: awake and alert and oriented Pain management: pain level controlled Vital Signs Assessment: post-procedure vital signs reviewed and stable Respiratory status: spontaneous breathing, nonlabored ventilation and respiratory function stable Cardiovascular status: blood pressure returned to baseline and stable Postop Assessment: no apparent nausea or vomiting Anesthetic complications: no  No notable events documented.   Last Vitals:  Vitals:   04/19/22 1236 04/19/22 1237  BP: (!) 84/49 (!) 96/52  Pulse: 77   Resp:    Temp:    SpO2: 93% 96%    Last Pain:  Vitals:   04/19/22 1237  TempSrc:   PainSc: 0-No pain                 Idolina Mantell C Ande Therrell

## 2022-04-19 NOTE — Discharge Instructions (Signed)
EGD Discharge instructions Please read the instructions outlined below and refer to this sheet in the next few weeks. These discharge instructions provide you with general information on caring for yourself after you leave the hospital. Your doctor may also give you specific instructions. While your treatment has been planned according to the most current medical practices available, unavoidable complications occasionally occur. If you have any problems or questions after discharge, please call your doctor. ACTIVITY You may resume your regular activity but move at a slower pace for the next 24 hours.  Take frequent rest periods for the next 24 hours.  Walking will help expel (get rid of) the air and reduce the bloated feeling in your abdomen.  No driving for 24 hours (because of the anesthesia (medicine) used during the test).  You may shower.  Do not sign any important legal documents or operate any machinery for 24 hours (because of the anesthesia used during the test).  NUTRITION Drink plenty of fluids.  You may resume your normal diet.  Begin with a light meal and progress to your normal diet.  Avoid alcoholic beverages for 24 hours or as instructed by your caregiver.  MEDICATIONS You may resume your normal medications unless your caregiver tells you otherwise.  WHAT YOU CAN EXPECT TODAY You may experience abdominal discomfort such as a feeling of fullness or "gas" pains.  FOLLOW-UP Your doctor will discuss the results of your test with you.  SEEK IMMEDIATE MEDICAL ATTENTION IF ANY OF THE FOLLOWING OCCUR: Excessive nausea (feeling sick to your stomach) and/or vomiting.  Severe abdominal pain and distention (swelling).  Trouble swallowing.  Temperature over 101 F (37.8 C).  Rectal bleeding or vomiting of blood.    Significant esophageal varices remain.  4 bands placed today.  You should have a repeat EGD to touch up further in about 6 to 8 weeks  Clear liquid diet the remainder  of today  Chloraseptic spray for any transient sore throat or chest pain you may experience  By all means,  keep your appointment at [facility].  No change in your medical regimen at this time  At patient request, I called Lillia Corporal at [phone] -reviewed findings and recommendations

## 2022-04-19 NOTE — Transfer of Care (Signed)
Immediate Anesthesia Transfer of Care Note  Patient: Erica Banks  Procedure(s) Performed: ESOPHAGOGASTRODUODENOSCOPY (EGD) WITH PROPOFOL ESOPHAGEAL BANDING  Patient Location: Short Stay  Anesthesia Type:General  Level of Consciousness: sedated  Airway & Oxygen Therapy: Patient Spontanous Breathing  Post-op Assessment: Report given to RN  Post vital signs: Reviewed and stable  Last Vitals:  Vitals Value Taken Time  BP 79/37 04/19/22 1225  Temp 36.7 C 04/19/22 1225  Pulse 73 04/19/22 1225  Resp    SpO2 91 % 04/19/22 1225    Last Pain:  Vitals:   04/19/22 1225  TempSrc: Oral  PainSc: Asleep         Complications: No notable events documented.

## 2022-04-22 ENCOUNTER — Ambulatory Visit (HOSPITAL_COMMUNITY)
Admission: RE | Admit: 2022-04-22 | Discharge: 2022-04-22 | Disposition: A | Discharge status: Home / Self Care | Payer: Medicaid Other | Source: Ambulatory Visit | Attending: Nurse Practitioner | Admitting: Nurse Practitioner

## 2022-04-22 ENCOUNTER — Encounter (HOSPITAL_COMMUNITY): Payer: Self-pay

## 2022-04-22 DIAGNOSIS — R188 Other ascites: Secondary | ICD-10-CM | POA: Diagnosis not present

## 2022-04-22 MED ORDER — ALBUMIN HUMAN 25 % IV SOLN: 50.0000 g | Freq: Once | INTRAVENOUS | Status: AC

## 2022-04-22 MED ORDER — ALBUMIN HUMAN 25 % IV SOLN
INTRAVENOUS | Status: AC
  Administered 2022-04-22: 50 g via INTRAVENOUS
  Filled 2022-04-22: qty 200

## 2022-04-22 NOTE — Progress Notes (Signed)
Paracentesis complete no signs of distress. 7.3 liters ascites removed. 50G Albumin given IV.

## 2022-04-30 DIAGNOSIS — Z01818 Encounter for other preprocedural examination: Secondary | ICD-10-CM | POA: Insufficient documentation

## 2022-05-01 ENCOUNTER — Encounter (HOSPITAL_COMMUNITY): Payer: Self-pay | Admitting: Internal Medicine

## 2022-05-01 DIAGNOSIS — K469 Unspecified abdominal hernia without obstruction or gangrene: Secondary | ICD-10-CM | POA: Insufficient documentation

## 2022-05-02 ENCOUNTER — Ambulatory Visit (HOSPITAL_COMMUNITY)
Admission: RE | Admit: 2022-05-02 | Discharge: 2022-05-02 | Disposition: A | Discharge status: Home / Self Care | Payer: Medicaid Other | Source: Ambulatory Visit | Attending: Gastroenterology | Admitting: Gastroenterology

## 2022-05-02 DIAGNOSIS — R188 Other ascites: Secondary | ICD-10-CM | POA: Insufficient documentation

## 2022-05-02 LAB — BODY FLUID CELL COUNT WITH DIFFERENTIAL
Eos, Fluid: 0 %
Lymphs, Fluid: 60 %
Monocyte-Macrophage-Serous Fluid: 37 % — ABNORMAL LOW (ref 50–90)
Neutrophil Count, Fluid: 3 % (ref 0–25)
Total Nucleated Cell Count, Fluid: 129 cu mm (ref 0–1000)

## 2022-05-02 MED ORDER — ALBUMIN HUMAN 25 % IV SOLN
12.5000 g | Freq: Once | INTRAVENOUS | Status: AC
  Administered 2022-05-02: 12.5 g via INTRAVENOUS

## 2022-05-02 MED ORDER — ALBUMIN HUMAN 25 % IV SOLN
INTRAVENOUS | Status: AC
  Filled 2022-05-02: qty 250

## 2022-05-02 NOTE — Progress Notes (Signed)
Pt transported to [place] in no obvious distress. Distended abdomen, denies pain. Placed upon stretcher, procedure explained, consent obtained. Prepped and draped in sterile fashion. Access obtained at RLQ without complication, clear serous fluid retrieved for sampling, vacutainer suction connected. ~ 3.5 L serous fluid retrieved. Access removed, dermabond and hypafix applied. Hemostasis achieved, Pt escorted to radiology waiting with staff.

## 2022-05-02 NOTE — Procedures (Signed)
   US guided RLQ paracentesis  3 Liters - yellow fluid was obtained Sent for labs per MD  Tolerated well  EBL: scant

## 2022-05-03 NOTE — Progress Notes (Signed)
Pt is aware.  

## 2022-05-08 ENCOUNTER — Other Ambulatory Visit: Payer: Self-pay

## 2022-05-08 DIAGNOSIS — K7031 Alcoholic cirrhosis of liver with ascites: Secondary | ICD-10-CM

## 2022-05-10 ENCOUNTER — Encounter (HOSPITAL_COMMUNITY): Payer: Self-pay

## 2022-05-10 ENCOUNTER — Ambulatory Visit (HOSPITAL_COMMUNITY)
Admission: RE | Admit: 2022-05-10 | Discharge: 2022-05-10 | Disposition: A | Discharge status: Home / Self Care | Payer: Medicaid Other | Source: Ambulatory Visit | Attending: Nurse Practitioner | Admitting: Nurse Practitioner

## 2022-05-10 DIAGNOSIS — R188 Other ascites: Secondary | ICD-10-CM | POA: Diagnosis present

## 2022-05-10 MED ORDER — LIDOCAINE HCL (PF) 2 % IJ SOLN
INTRAMUSCULAR | Status: AC
  Filled 2022-05-10: qty 10

## 2022-05-10 MED ORDER — LIDOCAINE HCL (PF) 2 % IJ SOLN
10.0000 mL | Freq: Once | INTRAMUSCULAR | Status: AC
  Administered 2022-05-10: 10 mL

## 2022-05-10 MED ORDER — ALBUMIN HUMAN 25 % IV SOLN
37.5000 g | Freq: Once | INTRAVENOUS | Status: AC
  Administered 2022-05-10: 37.5 g via INTRAVENOUS

## 2022-05-10 MED ORDER — ALBUMIN HUMAN 25 % IV SOLN
INTRAVENOUS | Status: AC
  Filled 2022-05-10: qty 200

## 2022-05-10 NOTE — Progress Notes (Signed)
Patient tolerated right sided paracentesis and 37.5mg  of IV albumin well today and 6.3 Liters of ascites drained. Patient verbalized understanding of discharge instructions and ambulatory at departure with no acute distress noted.

## 2022-05-14 NOTE — Progress Notes (Signed)
Referring Provider: Toma Deiters, MD Primary Care Physician:  Toma Deiters, MD Primary GI Physician: Dr. Jena Gauss  Chief Complaint  Patient presents with   Follow-up    Needs a paracentesis     HPI:   Erica Banks is a 42 y.o. female  with history of alcoholic cirrhosis complicated by recurrent ascites, SBP, previously on daily Cipro for prophylaxis, hepatic encephalopathy, esophageal varices with variceal bleed in February 2024 s/p banding. Also with history of IDA following with hematology receiving IV iron, GERD, PE, DVT, neuropathy. She is presenting today for follow-up.   Last seen in our office 04/04/22. Still with ascites on Lasix 40 mg BID, spironolactone 100 mg daily but was increasing to 150 mg per Annamarie Major, NP. No HE on Lactulose and Xifaxan. She was on BB but HR not at goal; however, patient requested to stay at current dose due to feeling poorly with higher doses. No overt GI bleeding. Plan included paracentesis, update cbc, EGD, start Bactrim DS for SBP prophylaxis, continue current diuretic regimen, lactulose, Xifaxan, nadolol, keep upcoming appt with [facility], follow-up with hematology, follow-up in our office in 6 weeks. Considered TIPS but patient not ideal candidate in the setting of recurrent HE with multiple admissions for this.   Hgb 8.9.  EGD 04/19/22: Grade 2-3 esophageal varices s/p banding, PHG with touch friability. Recommended surveillance EGD in 6-8 weeks.   She has had 5 paras since I saw her last ranging from weekly to every 1.5 weeks.   Patient saw [facility] 04/29/2022 for transplant evaluation.  It was felt that patient was medically a candidate for liver transplant.  No absolute medical contraindications.  However, she was found to have suggestion of possible atrial shunting on TTE and stress test which is indeterminant with recommendations for patient to see cardiology at [facility] for further evaluation.  Recommended considering physical therapy,  establishing with relapse prevention counselor.  Also planned to discuss whether or not patient would need to see hematology due to history of unprovoked DVT/PE.  Plan to discuss with committee.  Regarding her routine cirrhosis care, I recommended continuing her current medications, EGDs locally, paracentesis weekly to every 2 weeks as needed.  Due to history of esophageal varices, recommended considering transition from nadolol to carvedilol starting at 3.125 mg twice daily with a goal to uptitrate to 6.25 mg p.o. twice daily.  Stated they would defer this to local providers.  Today:  Labs: 04/30/22 with [facility].  Hemoglobin 8.7, platelets 155, creatinine 0.99, sodium 137, potassium 4.1, albumin 3.5, total bilirubin 1.7, alkaline phosphatase 140, AST 84, ALT 29, INR 1.3 MELD 3.0: 13 [place]: Last imaging is CT A/P with contrast 02/13/22 with hepatic cirrhosis, some increase in geographic hypodensity throughout the liver probably reflecting component of steatosis.  Stated this appeared infiltrative rather than masslike.  AFP:  Elevated at 6.1 in January with recommendations to repeat in 3 months. AFP down to 4.7 on 04/30/22.   Hep A/B vaccination: Immune to Hep A. Needs Hep B Vaccination. Started this at [facility].  EGD: EGD 03/21/22 actively bleeding esophageal varices s/p banding x 5 with bleeding resolved, portal hypertensive gastropathy. 04/19/22: Grade 2-3 esophageal varices s/p banding, PHG with touch friability. Recommended surveillance EGD in 6-8 weeks.  NSBB: Nadolol 20 mg daily.  Ascites/peripheral edema: Needs paracentesis.  Abdomen is swollen and causing abdominal discomfort. No peripheral edema.  Diuretics: On Lasix 80 mg in the morning and spironolactone 150 mg daily.  Encephalopathy: Lactulose  3-4 times a day to have 3 Bms per day. Also taking Xifaxin BID.  SBP:  History of SBP in the past. On Bactrim.   GI Bleeding: Last in Feb. 2024- variceal bleed. No brbpr or melena.   ETOH: None sine 02/2021.   Illicit drug use: None. Stopped marijuana about 4 months ago.   Hasn't gotten back in with hematology.    Past Medical History:  Diagnosis Date   Anemia    Cirrhosis    DVT (deep venous thrombosis)    Low iron    Neuropathy    Neuropathy    Panic attacks    PONV (postoperative nausea and vomiting)    Pulmonary embolus 2019    Past Surgical History:  Procedure Laterality Date   ABDOMINAL HYSTERECTOMY     BIOPSY N/A 06/24/2013   Procedure: BIOPSY TERMINAL ILEUM;  Surgeon: Corbin Ade, MD;  Location: [facility];  Service: Endoscopy;  Laterality: N/A;   CHOLECYSTECTOMY     COLONOSCOPY N/A 06/24/2013   [id number] hemorrhoids/otherwise normal    COLONOSCOPY WITH PROPOFOL N/A 05/31/2021   Surgeon: Corbin Ade, MD; ery large nonbleeding internal and external hemorrhoids, portal colopathy.  Recommended repeat colonoscopy in 10 years.   ESOPHAGEAL BANDING  03/21/2022   Procedure: ESOPHAGEAL BANDING;  Surgeon: [name], MD;  Location: [facility] ENDOSCOPY;  Service: Gastroenterology;;   ESOPHAGEAL BANDING N/A 04/19/2022   Procedure: ESOPHAGEAL BANDING;  Surgeon: Corbin Ade, MD;  Location: [facility];  Service: Endoscopy;  Laterality: N/A;   ESOPHAGOGASTRODUODENOSCOPY (EGD) WITH PROPOFOL N/A 03/29/2021   Surgeon: Lanelle Bal, DO;  grade 1 esophageal varices, portal hypertensive gastropathy with friable tissue with spontaneous ooze, normal duodenum.   ESOPHAGOGASTRODUODENOSCOPY (EGD) WITH PROPOFOL N/A 05/31/2021   Surgeon: Corbin Ade, MD;  grade 1-2 esophageal varices without bleeding stigmata, advanced appearing portal hypertensive gastropathy.   ESOPHAGOGASTRODUODENOSCOPY (EGD) WITH PROPOFOL N/A 12/22/2021   Procedure: ESOPHAGOGASTRODUODENOSCOPY (EGD) WITH PROPOFOL;  Surgeon: Dolores Frame, MD;  Location: [facility];  Service: Gastroenterology;  Laterality: N/A;   ESOPHAGOGASTRODUODENOSCOPY (EGD) WITH PROPOFOL N/A 03/21/2022   Surgeon:  [name], MD;  actively bleeding esophageal varices s/p banding x 5 with bleeding resolved, portal hypertensive gastropathy.   ESOPHAGOGASTRODUODENOSCOPY (EGD) WITH PROPOFOL N/A 04/19/2022   Procedure: ESOPHAGOGASTRODUODENOSCOPY (EGD) WITH PROPOFOL;  Surgeon: Corbin Ade, MD;  Location: [facility];  Service: Endoscopy;  Laterality: N/A;  1:45 pm, pt knows to arrive at 9:45   IR PARACENTESIS  03/13/2022   IR PARACENTESIS  03/25/2022   nasal bone surgery     NASAL SINUS SURGERY     SINUS IRRIGATION     TONSILLECTOMY     TUBAL LIGATION      Current Outpatient Medications  Medication Sig Dispense Refill   carvedilol (COREG) 3.125 MG tablet Take 1 tablet (3.125 mg total) by mouth 2 (two) times daily with a meal. 60 tablet 1   furosemide (LASIX) 40 MG tablet Take 1 tablet (40 mg total) by mouth 2 (two) times daily. (Patient taking differently: Take 80 mg by mouth in the morning.) 60 tablet 2   lactulose (CHRONULAC) 10 GM/15ML solution Take 15 mLs (10 g total) by mouth 3 (three) times daily. (Patient taking differently: Take 10 g by mouth in the morning, at noon, in the evening, and at bedtime.) 1350 mL 2   Multiple Vitamin (MULTIVITAMIN WITH MINERALS) TABS tablet Take 1 tablet by mouth daily. 30 tablet 2   pantoprazole (PROTONIX) 40 MG  tablet Take 1 tablet (40 mg total) by mouth 2 (two) times daily before a meal. 60 tablet 2   rifaximin (XIFAXAN) 550 MG TABS tablet Take 1 tablet (550 mg total) by mouth 2 (two) times daily. 60 tablet 2   spironolactone (ALDACTONE) 100 MG tablet Take 1 tablet (100 mg total) by mouth 2 (two) times daily. 60 tablet 3   sulfamethoxazole-trimethoprim (BACTRIM DS) 800-160 MG tablet Take 1 tablet by mouth daily. 30 tablet 5   No current facility-administered medications for this visit.    Allergies as of 05/16/2022 - Review Complete 05/16/2022  Allergen Reaction Noted   Demerol Anaphylaxis 10/12/2010    Family History  Problem Relation Age of Onset    Diabetes Mother    Hypertension Mother    COPD Mother    Cirrhosis Father        ETOH   Diabetes Sister    Hypertension Sister    Crohn's disease Maternal Grandmother    Cancer Other    Colon cancer Neg Hx    Autoimmune disease Neg Hx     Social History   Socioeconomic History   Marital status: Married    Spouse name: Not on file   Number of children: Not on file   Years of education: Not on file   Highest education level: Not on file  Occupational History   Occupation: Unemployed    Comment: wants to go to nursing school in fall 2015  Tobacco Use   Smoking status: Every Day    Packs/day: 0.50    Years: 12.00    Additional pack years: 0.00    Total pack years: 6.00    Types: Cigarettes   Smokeless tobacco: Never  Vaping Use   Vaping Use: Never used  Substance and Sexual Activity   Alcohol use: Not Currently    Alcohol/week: 4.0 - 5.0 standard drinks of alcohol    Types: 4 - 5 Cans of beer per week    Comment: None since February 2023.   Drug use: Not Currently    Types: Marijuana    Comment: Reports stopping in January 2024.   Sexual activity: Not Currently    Birth control/protection: Surgical  Other Topics Concern   Not on file  Social History Narrative   Not on file   Social Determinants of Health   Financial Resource Strain: Not on file  Food Insecurity: No Food Insecurity (03/08/2022)   Hunger Vital Sign    Worried About Running Out of Food in the Last Year: Never true    Ran Out of Food in the Last Year: Never true  Transportation Needs: No Transportation Needs (03/08/2022)   PRAPARE - Administrator, Civil Service (Medical): No    Lack of Transportation (Non-Medical): No  Physical Activity: Not on file  Stress: Not on file  Social Connections: Not on file    Review of Systems: Gen: Denies fever, chills, cold or flu like symptoms, pre-syncope, or syncope.  CV: Denies chest pain, palpitations. Resp: Denies dyspnea, cough.  GI: See  HPI Heme: See HPI  Physical Exam: BP 105/67 (BP Location: Right Arm, Patient Position: Sitting, Cuff Size: Normal)   Pulse 73   Temp 97.9 F (36.6 C) (Temporal)   Ht 5\' 6"  (1.676 m)   Wt 141 lb 12.8 oz (64.3 kg)   LMP  (LMP Unknown)   SpO2 100%   BMI 22.89 kg/m  General:   Alert and oriented. No distress noted. Pleasant and  cooperative.  Head:  Normocephalic and atraumatic. Eyes:  Conjuctiva clear without scleral icterus. Heart:  S1, S2 present without murmurs appreciated. Lungs:  Clear to auscultation bilaterally. No wheezes, rales, or rhonchi. No distress.  Abdomen:  +BS. Distended with ascites but soft and non tender. +HSM. Soft reducible ventral hernia in the epigastric area and umbilical hernia.  Msk:  Symmetrical without gross deformities. Normal posture. Extremities:  Without edema. Neurologic:  Alert and  oriented x4 Psych:  Normal mood and affect.    Assessment:  42 y.o. female with history of alcoholic cirrhosis complicated by recurrent ascites, SBP previously, recurrent hepatic encephalopathy, esophageal varices with variceal bleed Feb 2024, IDA following with hematology receiving IV iron, GERD, PE, DVT, neuropathy. She is presenting today for follow-up of cirrhosis.   ETOH Cirrhosis:  Decompensated.Following with [facility] for transplant evaluation. MELD 3.0, 13, previously 18.  Abstinent from alcohol since February 2023.  Stopped marijuana about 3-4 months ago. Cirrhosis has been complicated by recurrent ascites requiring frequent paracentesis, recurrent hepatic encephalopathy requiring hospitalization, SBP, esophageal varices with bleeding in Feb. 2024, s/p banding, now on NSBB. AFP elevated at 6.1 in January 2024, but recently normalized to 4.7 earlier this month. Most recent imaging with CT A/P with contrast in January with hepatic cirrhosis, some increase in geographic hypodensity throughout the liver probably reflecting component of steatosis.   Ascites:   Recurrent ascites requiring frequent paracentesis every 1-2 weeks.  She reports compliance with low-sodium diet, Lasix 80 mg in the morning and spironolactone 150 mg in the morning.  It was previously felt that due to recurrent encephalopathy, patient was not a great candidate for TIPS.  However, recurrent encephalopathy was secondary to medication noncompliance/not realizing that she was supposed to have 3-4 bowel movements every day.  Since this has been extensively explained to her, she is taking her lactulose as prescribed, and has not had any recurrent trouble with encephalopathy.  She is also on Xifaxan.  We again discussed TIPS today with the increased risk of developing hepatic encephalopathy post TIPS, the patient would like to proceed with evaluation.  She is requesting repeat paracentesis today due to abdominal tension causing discomfort.  We will also try increasing spironolactone to 200 mg, having her take 100 mg in the morning and evening and also splitting Lasix to 40 mg in the morning and evening.  Esophageal Varices:  Needs repeat EGD in a couple of weeks for possible variceal banding.  Previously with variceal bleed in February 2024 with banding x 5.  Most recent EGD March 2024 with grade 2-3 esophageal varices s/p banding, portal hypertensive gastropathy with touch friability.  Recommended surveillance in weeks.  She is currently on nadolol 20 mg daily, heart rate not at goal, but blood pressure is on the side he has previously been intolerant to higher doses of an SBP.  Per review of office visit with [facility], they have recommended considering switching from nadolol to carvedilol 3.125 mg twice daily and hopefully titrating up to 6.25 mg twice daily.  They recommended for patient to have this done locally so she can have close follow-up for titration.   Hepatic Encephalopathy:  No encephalopathy at this time.  Compliant with lactulose 3-4 times daily and Xifaxan twice daily.  She is having  3-4 bowel movements daily.   History of SBP:  Currently on Bactrim daily for prophylaxis.  IDA: Chronic.  Following with hematology for IV iron as needed, but has missed follow-ups with hematology due to admissions  and/or being busy with cirrhosis care/transplant evaluation.  She denies overt GI bleeding.  Her most recent hemoglobin is 8.7 (stable) with normocytic indices, ferritin 20 on 04/30/2022.  EGD and colonoscopy up-to-date.   Plan:  Paracentesis with labs and albumin today if possible. Will give weekly standing orders as well with Max 6L.  Proceed with upper endoscopy with esophageal variceal banding with propofol by Dr. Jena Gauss in near future. The risks, benefits, and alternatives have been discussed with the patient in detail. The patient states understanding and desires to proceed.  ASA 3 UPT prior Refer to IR for TIPS eval.  Increase spironolactone to 100 mg twice daily. Continue current dose of Lasix, but take 40 g in the morning and 40 mg in the evening with spironolactone. Repeat BMP in 1 week. 2g sodium diet restriction.  Stop nadolol. Start carvedilol 3.125 mg twice daily.  Requested patient keep a log of heart rate and blood pressure over the next week and bring to our office.  She will notify me if she begins to feel poorly or if systolic blood pressure drops below 90 or heart rate drops below 55. Continue Lactulose 3-4 times daily with goal of 3-4 Bms daily.  Continue Xifaxan 550 mg twice daily Continue Bactrim DS 1 tablet daily. Next RUQ [place] in July 2024. Will place on recall.  Advised patient to arrange follow-up with hematology for IDA. Follow-up in our office after EGD.   Ermalinda Memos, PA-C [facility] 05/16/2022

## 2022-05-16 ENCOUNTER — Encounter (HOSPITAL_COMMUNITY): Payer: Self-pay

## 2022-05-16 ENCOUNTER — Other Ambulatory Visit: Payer: Self-pay | Admitting: Gastroenterology

## 2022-05-16 ENCOUNTER — Ambulatory Visit (INDEPENDENT_AMBULATORY_CARE_PROVIDER_SITE_OTHER): Payer: Medicaid Other | Admitting: Gastroenterology

## 2022-05-16 ENCOUNTER — Other Ambulatory Visit: Payer: Self-pay | Admitting: *Deleted

## 2022-05-16 ENCOUNTER — Ambulatory Visit (HOSPITAL_COMMUNITY)
Admission: RE | Admit: 2022-05-16 | Discharge: 2022-05-16 | Disposition: A | Discharge status: Home / Self Care | Payer: Medicaid Other | Source: Ambulatory Visit | Attending: Gastroenterology | Admitting: Gastroenterology

## 2022-05-16 ENCOUNTER — Encounter: Payer: Self-pay | Admitting: Gastroenterology

## 2022-05-16 VITALS — BP 105/67 | HR 73 | Temp 97.9°F | Ht 66.0 in | Wt 141.8 lb

## 2022-05-16 VITALS — BP 107/60 | HR 64 | Temp 98.1°F | Resp 18

## 2022-05-16 DIAGNOSIS — K7031 Alcoholic cirrhosis of liver with ascites: Secondary | ICD-10-CM

## 2022-05-16 DIAGNOSIS — I8501 Esophageal varices with bleeding: Secondary | ICD-10-CM | POA: Diagnosis not present

## 2022-05-16 DIAGNOSIS — Z8619 Personal history of other infectious and parasitic diseases: Secondary | ICD-10-CM | POA: Diagnosis not present

## 2022-05-16 DIAGNOSIS — R188 Other ascites: Secondary | ICD-10-CM

## 2022-05-16 DIAGNOSIS — D509 Iron deficiency anemia, unspecified: Secondary | ICD-10-CM

## 2022-05-16 LAB — BODY FLUID CELL COUNT WITH DIFFERENTIAL
Eos, Fluid: 0 %
Lymphs, Fluid: 66 %
Monocyte-Macrophage-Serous Fluid: 33 % — ABNORMAL LOW (ref 50–90)
Neutrophil Count, Fluid: 1 % (ref 0–25)
Total Nucleated Cell Count, Fluid: 133 cu mm (ref 0–1000)

## 2022-05-16 LAB — GRAM STAIN: Gram Stain: NONE SEEN

## 2022-05-16 MED ORDER — SPIRONOLACTONE 100 MG PO TABS
100.0000 mg | ORAL_TABLET | Freq: Two times a day (BID) | ORAL | 3 refills | Status: DC
Start: 2022-05-16 — End: 2022-10-15

## 2022-05-16 MED ORDER — ALBUMIN HUMAN 25 % IV SOLN
INTRAVENOUS | Status: AC
  Filled 2022-05-16: qty 200

## 2022-05-16 MED ORDER — ALBUMIN HUMAN 25 % IV SOLN
25.0000 g | Freq: Once | INTRAVENOUS | Status: AC
  Administered 2022-05-16: 25 g via INTRAVENOUS

## 2022-05-16 MED ORDER — CARVEDILOL 3.125 MG PO TABS
3.1250 mg | ORAL_TABLET | Freq: Two times a day (BID) | ORAL | 1 refills | Status: DC
Start: 2022-05-16 — End: 2022-08-30

## 2022-05-16 NOTE — Progress Notes (Signed)
Pt. Brought to [place] suite, ambulatory. Placed on stretcher, procedure explained, consent obtained. Prepped and draped per sterile manner. Access obtained at RLQ without complication. Clear serous fluid retrieved per syringe and vacutainer suction. 2.5 L total output. Access removed, dermabond applied to access, hemostasis, no hematoma.

## 2022-05-16 NOTE — Procedures (Addendum)
PROCEDURE SUMMARY:  Successful ultrasound guided diagnostic and therapeutic paracentesis from the RLQ. Yielded 2. 5L of clear, amber fluid.  No immediate complications.  The patient tolerated the procedure well.   Specimen was sent for labs.  EBL < 1 mL  The patient has previously been formally evaluated by the [facility] and is being actively followed for potential future intervention.     Shon Hough, AGNP 05/16/2022 12:42 PM

## 2022-05-16 NOTE — Patient Instructions (Addendum)
We will arrange for her to have a paracentesis.  I will also place standing orders for you to have a weekly para as needed.  We will refer you to interventional radiology for consideration of TIPS.  We will arrange to have an upper endoscopy with Dr. Jena Gauss for esophageal variceal banding.  Continue to follow closely with [facility] for liver transplant evaluation.  Increase spironolactone to 100 mg in the morning and 100 mg in the evening. Take Lasix 40 mg in the morning and 40 mg in the evening with your spironolactone. You will need to have repeat blood work next Thursday or Friday to recheck your kidney function and your electrolytes.  Stop nadolol.  Start Carvedilol 3.125 mg twice daily. Check your heart rate and blood pressure every day and keep a log of this for me.  Please bring back the log next week when you have your blood work done. If you start to feel poorly on this medication, please let me know.  If the top number for your blood pressure drops below 90 or your heart rate drops below 55, please let me know.   Continue lactulose 3-4 times daily with a goal of 3-4 bowel movements every day.  Continue Xifaxan 550 mg twice daily.  Continue Bactrim 1 tablet daily.  Continue pantoprazole 40 mg twice daily.  Nutrition recommendations:  High-protein diet from a primarily plant-based diet. Avoid red meat.  No raw or undercooked meat, seafood, or shellfish. Low-fat/cholesterol/carbohydrate diet. Limit sodium to no more than 2000 mg/day including everything that you eat and drink. Recommend at least 30 minutes of aerobic and resistance exercise 3 days/week.

## 2022-05-17 ENCOUNTER — Telehealth: Payer: Self-pay | Admitting: Gastroenterology

## 2022-05-17 ENCOUNTER — Telehealth: Payer: Self-pay | Admitting: *Deleted

## 2022-05-17 ENCOUNTER — Encounter: Payer: Self-pay | Admitting: Gastroenterology

## 2022-05-17 NOTE — Telephone Encounter (Signed)
[facility]  EGD w/ variceal banding, ASA 3, Dr.Rourk, UPT Needs to be scheduled 2-4 weeks from 05/16/22

## 2022-05-17 NOTE — Telephone Encounter (Signed)
Patient needs RUQ [place] in July 2024. Please place on recall.

## 2022-05-18 LAB — CULTURE, BODY FLUID W GRAM STAIN -BOTTLE

## 2022-05-19 LAB — CULTURE, BODY FLUID W GRAM STAIN -BOTTLE

## 2022-05-20 ENCOUNTER — Other Ambulatory Visit: Payer: Self-pay | Admitting: *Deleted

## 2022-05-20 DIAGNOSIS — K7031 Alcoholic cirrhosis of liver with ascites: Secondary | ICD-10-CM

## 2022-05-20 LAB — CULTURE, BODY FLUID W GRAM STAIN -BOTTLE

## 2022-05-21 LAB — CULTURE, BODY FLUID W GRAM STAIN -BOTTLE: Culture: NO GROWTH

## 2022-05-23 ENCOUNTER — Encounter (HOSPITAL_COMMUNITY): Payer: Self-pay

## 2022-05-23 ENCOUNTER — Ambulatory Visit (HOSPITAL_COMMUNITY)
Admission: RE | Admit: 2022-05-23 | Discharge: 2022-05-23 | Disposition: A | Discharge status: Home / Self Care | Payer: Medicaid Other | Source: Ambulatory Visit | Attending: Nurse Practitioner | Admitting: Nurse Practitioner

## 2022-05-23 DIAGNOSIS — R188 Other ascites: Secondary | ICD-10-CM | POA: Insufficient documentation

## 2022-05-23 NOTE — Progress Notes (Signed)
Patient tolerated right sided paracentesis procedure well today and 1.3 Liters of ascites removed. Patient verbalized understanding of discharge instructions and ambulatory at departure with no acute distress noted.

## 2022-05-23 NOTE — Procedures (Signed)
PROCEDURE SUMMARY:  Successful US guided paracentesis from right lateral abdomen.  Yielded 1.3 liters of amber fluid.  No immediate complications.  Pt tolerated well.   Specimen was not sent for labs.  EBL < 5mL  Hoyt Koch PA-C 05/23/2022 11:14 AM

## 2022-05-28 ENCOUNTER — Inpatient Hospital Stay: Admission: RE | Admit: 2022-05-28 | Payer: Medicaid Other | Source: Ambulatory Visit

## 2022-05-30 ENCOUNTER — Emergency Department (HOSPITAL_COMMUNITY)
Admission: EM | Admit: 2022-05-30 | Discharge: 2022-05-31 | Disposition: A | Discharge status: Home / Self Care | Payer: Medicaid Other | Attending: Emergency Medicine | Admitting: Emergency Medicine

## 2022-05-30 ENCOUNTER — Encounter (HOSPITAL_COMMUNITY): Payer: Self-pay | Admitting: Emergency Medicine

## 2022-05-30 ENCOUNTER — Emergency Department (HOSPITAL_COMMUNITY): Payer: Medicaid Other

## 2022-05-30 ENCOUNTER — Other Ambulatory Visit: Payer: Self-pay

## 2022-05-30 DIAGNOSIS — K429 Umbilical hernia without obstruction or gangrene: Secondary | ICD-10-CM | POA: Insufficient documentation

## 2022-05-30 DIAGNOSIS — R1033 Periumbilical pain: Secondary | ICD-10-CM | POA: Diagnosis present

## 2022-05-30 LAB — COMPREHENSIVE METABOLIC PANEL
ALT: 31 U/L (ref 0–44)
AST: 65 U/L — ABNORMAL HIGH (ref 15–41)
Albumin: 3.4 g/dL — ABNORMAL LOW (ref 3.5–5.0)
Alkaline Phosphatase: 117 U/L (ref 38–126)
Anion gap: 7 (ref 5–15)
BUN: 19 mg/dL (ref 6–20)
CO2: 22 mmol/L (ref 22–32)
Calcium: 8.8 mg/dL — ABNORMAL LOW (ref 8.9–10.3)
Chloride: 103 mmol/L (ref 98–111)
Creatinine, Ser: 1.11 mg/dL — ABNORMAL HIGH (ref 0.44–1.00)
GFR, Estimated: >60 mL/min (ref >60)
Glucose, Bld: 105 mg/dL — ABNORMAL HIGH (ref 70–99)
Potassium: 3.6 mmol/L (ref 3.5–5.1)
Sodium: 132 mmol/L — ABNORMAL LOW (ref 135–145)
Total Bilirubin: 2.3 mg/dL — ABNORMAL HIGH (ref 0.3–1.2)
Total Protein: 6.4 g/dL — ABNORMAL LOW (ref 6.5–8.1)

## 2022-05-30 LAB — CBC
HCT: 24.6 % — ABNORMAL LOW (ref 36.0–46.0)
Hemoglobin: 7.7 g/dL — ABNORMAL LOW (ref 12.0–15.0)
MCH: 26.7 pg (ref 26.0–34.0)
MCHC: 31.3 g/dL (ref 30.0–36.0)
MCV: 85.4 fL (ref 80.0–100.0)
Platelets: 129 10*3/uL — ABNORMAL LOW (ref 150–400)
RBC: 2.88 MIL/uL — ABNORMAL LOW (ref 3.87–5.11)
RDW: 16 % — ABNORMAL HIGH (ref 11.5–15.5)
WBC: 3.8 10*3/uL — ABNORMAL LOW (ref 4.0–10.5)
nRBC: 0 % (ref 0.0–0.2)

## 2022-05-30 LAB — LIPASE, BLOOD: Lipase: 51 U/L (ref 11–51)

## 2022-05-30 MED ORDER — HYDROMORPHONE HCL 1 MG/ML IJ SOLN
1.0000 mg | Freq: Once | INTRAMUSCULAR | Status: AC
  Administered 2022-05-30: 1 mg via INTRAVENOUS
  Filled 2022-05-30: qty 1

## 2022-05-30 MED ORDER — LORAZEPAM 2 MG/ML IJ SOLN
0.5000 mg | Freq: Once | INTRAMUSCULAR | Status: AC
  Administered 2022-05-30: 0.5 mg via INTRAVENOUS
  Filled 2022-05-30: qty 1

## 2022-05-30 MED ORDER — OXYCODONE HCL 5 MG PO TABS: 5.0000 mg | ORAL_TABLET | Freq: Four times a day (QID) | ORAL | 0 refills | Status: DC | PRN

## 2022-05-30 MED ORDER — IOHEXOL 300 MG/ML  SOLN
100.0000 mL | Freq: Once | INTRAMUSCULAR | Status: AC | PRN
  Administered 2022-05-30: 100 mL via INTRAVENOUS

## 2022-05-30 NOTE — ED Triage Notes (Signed)
Pt c/o increased umbilical hernia pain since this morning.

## 2022-05-30 NOTE — Discharge Instructions (Addendum)
Follow-up with Dr.Pappayliou or one of her "colleagues in the next couple weeks for your umbilical hernia.  Return if the pain gets worse or you start vomiting

## 2022-05-31 ENCOUNTER — Ambulatory Visit (HOSPITAL_COMMUNITY)
Admission: RE | Admit: 2022-05-31 | Discharge: 2022-05-31 | Disposition: A | Discharge status: Home / Self Care | Payer: Medicaid Other | Source: Ambulatory Visit | Attending: Nurse Practitioner | Admitting: Nurse Practitioner

## 2022-06-01 NOTE — ED Provider Notes (Signed)
[facility]ERGENC[facility] DEPARTMENT AT [facility] Provider Note   CSN: [id number] Arrival date & time: 05/30/22  2003     History  Chief Complaint  Patient presents with   Hernia    Erica Banks is a 42 y.o. female.  Patient has history of cirrhosis.  She is complaining of umbilical pain.  Patient has a hernia there   Abdominal Pain Pain location:  Periumbilical Pain quality: aching   Pain radiates to:  Does not radiate Pain severity:  Moderate Onset quality:  Sudden Timing:  Constant Progression:  Worsening Chronicity:  Recurrent Relieved by:  Nothing Worsened by:  Nothing Associated symptoms: no chest pain, no cough, no diarrhea, no fatigue and no hematuria        Home Medications Prior to Admission medications   Medication Sig Start Date End Date Taking? Authorizing Provider  oxyCODONE (ROXICODONE) 5 MG immediate release tablet Take 1 tablet (5 mg total) by mouth every 6 (six) hours as needed for severe pain. 05/30/22  Yes Bethann Berkshire, MD  carvedilol (COREG) 3.125 MG tablet Take 1 tablet (3.125 mg total) by mouth 2 (two) times daily with a meal. 05/16/22   Letta Median, PA-C  furosemide (LASIX) 40 MG tablet Take 1 tablet (40 mg total) by mouth 2 (two) times daily. Patient taking differently: Take 80 mg by mouth in the morning. 03/18/22   Lonia Blood, MD  lactulose (CHRONULAC) 10 GM/15ML solution Take 15 mLs (10 g total) by mouth 3 (three) times daily. Patient taking differently: Take 10 g by mouth in the morning, at noon, in the evening, and at bedtime. 03/18/22 06/16/22  Lonia Blood, MD  Multiple Vitamin (MULTIVITAMIN WITH MINERALS) TABS tablet Take 1 tablet by mouth daily. 03/18/22   Lonia Blood, MD  pantoprazole (PROTONIX) 40 MG tablet Take 1 tablet (40 mg total) by mouth 2 (two) times daily before a meal. 03/18/22   Lonia Blood, MD  rifaximin (XIFAXAN) 550 MG TABS tablet Take 1 tablet (550 mg total) by mouth 2 (two) times  daily. 03/18/22 06/16/22  Lonia Blood, MD  spironolactone (ALDACTONE) 100 MG tablet Take 1 tablet (100 mg total) by mouth 2 (two) times daily. 05/16/22   Letta Median, PA-C  sulfamethoxazole-trimethoprim (BACTRIM DS) 800-160 MG tablet Take 1 tablet by mouth daily. 04/04/22   Letta Median, PA-C      Allergies    Demerol    Review of Systems   Review of Systems  Constitutional:  Negative for appetite change and fatigue.  HENT:  Negative for congestion, ear discharge and sinus pressure.   Eyes:  Negative for discharge.  Respiratory:  Negative for cough.   Cardiovascular:  Negative for chest pain.  Gastrointestinal:  Positive for abdominal pain. Negative for diarrhea.  Genitourinary:  Negative for frequency and hematuria.  Musculoskeletal:  Negative for back pain.  Skin:  Negative for rash.  Neurological:  Negative for seizures and headaches.  Psychiatric/Behavioral:  Negative for hallucinations.     Physical Exam Updated Vital Signs BP 113/68   Pulse 89   Temp 97.8 F (36.6 C) (Oral)   Resp 16   Ht 5\' 6"  (1.676 m)   Wt 64.3 kg   LMP  (LMP Unknown)   SpO2 98%   BMI 22.89 kg/m  Physical Exam Vitals and nursing note reviewed.  Constitutional:      Appearance: She is well-developed.  HENT:     Head: Normocephalic.  Nose: Nose normal.  Eyes:     General: No scleral icterus.    Conjunctiva/sclera: Conjunctivae normal.  Neck:     Thyroid: No thyromegaly.  Cardiovascular:     Rate and Rhythm: Normal rate and regular rhythm.     Heart sounds: No murmur heard.    No friction rub. No gallop.  Pulmonary:     Breath sounds: No stridor. No wheezing or rales.  Chest:     Chest wall: No tenderness.  Abdominal:     General: There is no distension.     Tenderness: There is no abdominal tenderness. There is no rebound.     Hernia: A hernia is present.     Comments: Tenderness to umbilical area where a hernia is  Musculoskeletal:        General: Normal range of  motion.     Cervical back: Neck supple.  Lymphadenopathy:     Cervical: No cervical adenopathy.  Skin:    Findings: No erythema or rash.  Neurological:     Mental Status: She is alert and oriented to person, place, and time.     Motor: No abnormal muscle tone.     Coordination: Coordination normal.  Psychiatric:        Behavior: Behavior normal.     ED Results / Procedures / Treatments   Labs (all labs ordered are listed, but only abnormal results are displayed) Labs Reviewed  COMPREHENSIVE METABOLIC PANEL - Abnormal; Notable for the following components:      Result Value   Sodium 132 (*)    Glucose, Bld 105 (*)    Creatinine, Ser 1.11 (*)    Calcium 8.8 (*)    Total Protein 6.4 (*)    Albumin 3.4 (*)    AST 65 (*)    Total Bilirubin 2.3 (*)    All other components within normal limits  CBC - Abnormal; Notable for the following components:   WBC 3.8 (*)    RBC 2.88 (*)    Hemoglobin 7.7 (*)    HCT 24.6 (*)    RDW 16.0 (*)    Platelets 129 (*)    All other components within normal limits  LIPASE, BLOOD    EKG None  Radiology CT ABDOMEN PELVIS W CONTRAST  Result Date: 05/30/2022 CLINICAL DATA:  Acute abdominal pain. EXAM: CT ABDOMEN AND PELVIS WITH CONTRAST TECHNIQUE: Multidetector CT imaging of the abdomen and pelvis was performed using the standard protocol following bolus administration of intravenous contrast. RADIATION DOSE REDUCTION: This exam was performed according to the departmental dose-optimization program which includes automated exposure control, adjustment of the mA and/or kV according to patient size and/or use of iterative reconstruction technique. CONTRAST:  OMNIPAQUE IOHEXOL 300 MG/ML  SOLN COMPARISON:  CT abdomen and pelvis 02/03/2022 FINDINGS: Lower chest: No acute abnormality. Hepatobiliary: The liver is enlarged and heterogeneous with nodular liver contour compatible with cirrhosis, unchanged. Gallbladder is not seen. There is no biliary  ductal dilatation. Pancreas: Unremarkable. No pancreatic ductal dilatation or surrounding inflammatory changes. Spleen: Markedly enlarged, unchanged.  No focal lesion identified. Adrenals/Urinary Tract: Adrenal glands are unremarkable. Kidneys are normal, without renal calculi, focal lesion, or hydronephrosis. Bladder is unremarkable. Stomach/Bowel: Again seen is mild diffuse wall thickening of the colon, small bowel and gastric antrum, unchanged. There is also wall thickening of the distal esophagus. There is no bowel obstruction, pneumatosis or free air. The appendix is not visualized. Vascular/Lymphatic: Aorta and IVC are normal in size. Portal vein appears  patent. Again seen is recanalization of the umbilical vein as well as splenic varices. Reproductive: Status post hysterectomy. No adnexal masses. Other: Large volume ascites is again seen. Moderate-sized umbilical hernia contains ascites and mesenteric fat and has increased in size. There is also an upper abdominal midline ventral hernia containing fluid and fat, unchanged. Musculoskeletal: No acute or significant osseous findings. IMPRESSION: 1. Stable findings of cirrhosis and portal hypertension with large volume ascites, splenomegaly, and varices. 2. Diffuse wall thickening of the colon, small bowel, and gastric antrum, unchanged. Findings may be related to portal colopathy, hypoalbuminemia, or infectious/inflammatory process. No bowel obstruction or free air. 3. Wall thickening of the distal esophagus may represent esophagitis. 4. Moderate-sized umbilical hernia containing ascites and mesenteric fat has increased in size. Electronically Signed   By: Darliss Cheney M.D.   On: 05/30/2022 23:05    Procedures Hernia reduction  Date/Time: 06/01/2022 2:17 PM  Performed by: Bethann Berkshire, MD Authorized by: Bethann Berkshire, MD  Comments: Patient was given Dilaudid and Ativan and periumbilical hernia was reduced with manual pressure.  Patient tolerated  procedure well       Medications Ordered in ED Medications  HYDROmorphone (DILAUDID) injection 1 mg (1 mg Intravenous Given 05/30/22 2148)  LORazepam (ATIVAN) injection 0.5 mg (0.5 mg Intravenous Given 05/30/22 2149)  iohexol (OMNIPAQUE) 300 MG/ML solution 100 mL (100 mLs Intravenous Contrast Given 05/30/22 2236)    ED Course/ Medical Decision Making/ A&P                             Medical Decision Making Amount and/or Complexity of Data Reviewed Labs: ordered. Radiology: ordered.  Risk Prescription drug management.    This patient presents to the ED for concern of abdominal pain, this involves an extensive number of treatment options, and is a complaint that carries with it a high risk of complications and morbidity.  The differential diagnosis includes diverticulitis, appendicitis   Co morbidities that complicate the patient evaluation  Cirrhosis   Additional history obtained:  Additional history obtained from patient External records from outside source obtained and reviewed including hospital records   Lab Tests:  I Ordered, and personally interpreted labs.  The pertinent results include: CBC 3.8, hemoglobin 7.7, T. bili 2.3   Imaging Studies ordered:  I ordered imaging studies including CT abdomen I independently visualized and interpreted imaging which showed umbilical hernia with mesenteric fat I agree with the radiologist interpretation   Cardiac Monitoring: / EKG:  The patient was maintained on a cardiac monitor.  I personally viewed and interpreted the cardiac monitored which showed an underlying rhythm of: Normal sinus rhythm   Consultations Obtained: No consultant  Problem List / ED Course / Critical interventions / Medication management  Cirrhosis and abdominal pain I ordered medication including Dilaudid for pain Reevaluation of the patient after these medicines showed that the patient improved I have reviewed the patients home medicines and  have made adjustments as needed   Social Determinants of Health:  None   Test / Admission - Considered:  None    Patient with umbilical hernia.  She is referred to general surgery to have it repaired.        Final Clinical Impression(s) / ED Diagnoses Final diagnoses:  Umbilical hernia without obstruction and without gangrene    Rx / DC Orders ED Discharge Orders          Ordered    oxyCODONE (ROXICODONE) 5 MG  immediate release tablet  Every 6 hours PRN        05/30/22 2350              Bethann Berkshire, MD 06/01/22 1418

## 2022-06-04 ENCOUNTER — Other Ambulatory Visit: Payer: Self-pay | Admitting: *Deleted

## 2022-06-04 ENCOUNTER — Ambulatory Visit (HOSPITAL_COMMUNITY)
Admission: RE | Admit: 2022-06-04 | Discharge: 2022-06-04 | Disposition: A | Discharge status: Home / Self Care | Payer: Medicaid Other | Source: Ambulatory Visit | Attending: Nurse Practitioner | Admitting: Nurse Practitioner

## 2022-06-04 ENCOUNTER — Encounter (HOSPITAL_COMMUNITY): Payer: Self-pay

## 2022-06-04 DIAGNOSIS — K7460 Unspecified cirrhosis of liver: Secondary | ICD-10-CM

## 2022-06-04 DIAGNOSIS — R188 Other ascites: Secondary | ICD-10-CM | POA: Insufficient documentation

## 2022-06-04 MED ORDER — ALBUMIN HUMAN 25 % IV SOLN
INTRAVENOUS | Status: AC
  Filled 2022-06-04: qty 100

## 2022-06-04 MED ORDER — ALBUMIN HUMAN 25 % IV SOLN
25.0000 g | Freq: Once | INTRAVENOUS | Status: AC
  Administered 2022-06-04: 25 g via INTRAVENOUS

## 2022-06-04 NOTE — Telephone Encounter (Signed)
LMTRC

## 2022-06-04 NOTE — Progress Notes (Signed)
Patient tolerated right sided paracentesis procedure well today and 2.9 Liters of ascites removed and 25G of IV albumin given. Patient verbalized understanding of discharge instructions and ambulatory at departure with no acute distress noted.

## 2022-06-04 NOTE — Procedures (Signed)
PROCEDURE SUMMARY:  Successful ultrasound guided paracentesis from the right lower quadrant.  Yielded 2.9 L of clear yellow fluid.  No immediate complications.  The patient tolerated the procedure well.   Specimen not sent for labs.  EBL < 2 mL  The patient has previously been formally evaluated by the [facility] and is being actively followed for potential future intervention.  Alwyn Ren, [place] [phone] 06/04/2022, 9:39 AM

## 2022-06-05 LAB — BASIC METABOLIC PANEL
BUN/Creatinine Ratio: 8 — ABNORMAL LOW (ref 9–23)
BUN: 7 mg/dL (ref 6–24)
CO2: 23 mmol/L (ref 20–29)
Calcium: 8.6 mg/dL — ABNORMAL LOW (ref 8.7–10.2)
Chloride: 102 mmol/L (ref 96–106)
Creatinine, Ser: 0.92 mg/dL (ref 0.57–1.00)
Glucose: 108 mg/dL — ABNORMAL HIGH (ref 70–99)
Potassium: 4.2 mmol/L (ref 3.5–5.2)
Sodium: 139 mmol/L (ref 134–144)
eGFR: 81 mL/min/{1.73_m2} (ref >59)

## 2022-06-11 NOTE — Telephone Encounter (Signed)
Pt left vm returning call to schedule procedure.  LMTRC

## 2022-06-14 ENCOUNTER — Other Ambulatory Visit (HOSPITAL_COMMUNITY): Payer: Self-pay | Admitting: Nurse Practitioner

## 2022-06-14 DIAGNOSIS — R188 Other ascites: Secondary | ICD-10-CM

## 2022-06-17 ENCOUNTER — Encounter (HOSPITAL_COMMUNITY): Payer: Self-pay

## 2022-06-17 ENCOUNTER — Ambulatory Visit (HOSPITAL_COMMUNITY)
Admission: RE | Admit: 2022-06-17 | Discharge: 2022-06-17 | Disposition: A | Discharge status: Home / Self Care | Payer: Medicaid Other | Source: Ambulatory Visit | Attending: Nurse Practitioner | Admitting: Nurse Practitioner

## 2022-06-17 DIAGNOSIS — R188 Other ascites: Secondary | ICD-10-CM

## 2022-06-17 LAB — BODY FLUID CELL COUNT WITH DIFFERENTIAL
Eos, Fluid: 0 %
Lymphs, Fluid: 66 %
Monocyte-Macrophage-Serous Fluid: 34 % — ABNORMAL LOW (ref 50–90)
Neutrophil Count, Fluid: 0 % (ref 0–25)
Total Nucleated Cell Count, Fluid: 137 cu mm (ref 0–1000)

## 2022-06-17 LAB — GRAM STAIN: Gram Stain: NONE SEEN

## 2022-06-17 NOTE — Progress Notes (Signed)
Patient tolerated right sided paracentesis procedure well today and 3.6 Liters of clear yellow ascites removed with labs collected and sent for processing. Patient verbalized understanding of discharge instructions and ambulatory at departure with no acute distress noted.

## 2022-06-17 NOTE — Procedures (Signed)
PROCEDURE SUMMARY:  Successful ultrasound guided paracentesis from the right lower quadrant.  Yielded 3.6 L of clear yellow fluid.  No immediate complications.  The patient tolerated the procedure well.   Specimen sent for labs.  EBL < 2 mL  The patient has previously been formally evaluated by the [facility] Interventional Radiology Portal Hypertension [facility] and is being actively followed for potential future intervention.  Alwyn Ren, [place] [phone] 06/17/2022, 1:46 PM

## 2022-06-18 LAB — CULTURE, BODY FLUID W GRAM STAIN -BOTTLE

## 2022-06-18 NOTE — Telephone Encounter (Signed)
Called to schedule pt for her procedure. Informed pt will give her a call once we get providers schedule for July because he is booked at this time. Pt verbalized understanding.

## 2022-06-19 LAB — CULTURE, BODY FLUID W GRAM STAIN -BOTTLE: Culture: NO GROWTH

## 2022-06-19 LAB — PATHOLOGIST SMEAR REVIEW

## 2022-06-22 LAB — CULTURE, BODY FLUID W GRAM STAIN -BOTTLE

## 2022-06-27 ENCOUNTER — Encounter: Payer: Self-pay | Admitting: *Deleted

## 2022-06-27 DIAGNOSIS — Z87891 Personal history of nicotine dependence: Secondary | ICD-10-CM | POA: Insufficient documentation

## 2022-06-27 NOTE — Telephone Encounter (Signed)
Pt has been scheduled for 07/31/22. Instructions mailed.

## 2022-06-28 ENCOUNTER — Encounter: Payer: Self-pay | Admitting: *Deleted

## 2022-07-03 ENCOUNTER — Encounter (HOSPITAL_COMMUNITY): Payer: Self-pay

## 2022-07-03 ENCOUNTER — Other Ambulatory Visit: Payer: Self-pay

## 2022-07-03 ENCOUNTER — Observation Stay (HOSPITAL_COMMUNITY)
Admission: EM | Admit: 2022-07-03 | Discharge: 2022-07-04 | Disposition: A | Discharge status: Home / Self Care | Payer: Medicaid Other | Attending: Family Medicine | Admitting: Family Medicine

## 2022-07-03 DIAGNOSIS — E871 Hypo-osmolality and hyponatremia: Secondary | ICD-10-CM | POA: Diagnosis present

## 2022-07-03 DIAGNOSIS — E8809 Other disorders of plasma-protein metabolism, not elsewhere classified: Secondary | ICD-10-CM | POA: Diagnosis present

## 2022-07-03 DIAGNOSIS — R9431 Abnormal electrocardiogram [ECG] [EKG]: Secondary | ICD-10-CM | POA: Diagnosis present

## 2022-07-03 DIAGNOSIS — E8729 Other acidosis: Secondary | ICD-10-CM | POA: Insufficient documentation

## 2022-07-03 DIAGNOSIS — K652 Spontaneous bacterial peritonitis: Secondary | ICD-10-CM | POA: Diagnosis not present

## 2022-07-03 DIAGNOSIS — R770 Abnormality of albumin: Secondary | ICD-10-CM | POA: Diagnosis not present

## 2022-07-03 DIAGNOSIS — D696 Thrombocytopenia, unspecified: Secondary | ICD-10-CM | POA: Insufficient documentation

## 2022-07-03 DIAGNOSIS — D649 Anemia, unspecified: Secondary | ICD-10-CM | POA: Diagnosis not present

## 2022-07-03 DIAGNOSIS — R109 Unspecified abdominal pain: Secondary | ICD-10-CM | POA: Diagnosis not present

## 2022-07-03 DIAGNOSIS — E872 Acidosis, unspecified: Secondary | ICD-10-CM | POA: Insufficient documentation

## 2022-07-03 DIAGNOSIS — E724 Disorders of ornithine metabolism: Secondary | ICD-10-CM | POA: Diagnosis not present

## 2022-07-03 DIAGNOSIS — D638 Anemia in other chronic diseases classified elsewhere: Secondary | ICD-10-CM | POA: Diagnosis present

## 2022-07-03 DIAGNOSIS — Z79899 Other long term (current) drug therapy: Secondary | ICD-10-CM | POA: Diagnosis not present

## 2022-07-03 DIAGNOSIS — E7220 Disorder of urea cycle metabolism, unspecified: Secondary | ICD-10-CM | POA: Diagnosis present

## 2022-07-03 LAB — CBC WITH DIFFERENTIAL/PLATELET
Abs Immature Granulocytes: 0.02 10*3/uL (ref 0.00–0.07)
Basophils Absolute: 0 10*3/uL (ref 0.0–0.1)
Basophils Relative: 1 %
Eosinophils Absolute: 0.1 10*3/uL (ref 0.0–0.5)
Eosinophils Relative: 2 %
HCT: 29.1 % — ABNORMAL LOW (ref 36.0–46.0)
Hemoglobin: 9.4 g/dL — ABNORMAL LOW (ref 12.0–15.0)
Immature Granulocytes: 0 %
Lymphocytes Relative: 16 %
Lymphs Abs: 0.8 10*3/uL (ref 0.7–4.0)
MCH: 28.7 pg (ref 26.0–34.0)
MCHC: 32.3 g/dL (ref 30.0–36.0)
MCV: 88.7 fL (ref 80.0–100.0)
Monocytes Absolute: 0.6 10*3/uL (ref 0.1–1.0)
Monocytes Relative: 12 %
Neutro Abs: 3.6 10*3/uL (ref 1.7–7.7)
Neutrophils Relative %: 69 %
Platelets: 109 10*3/uL — ABNORMAL LOW (ref 150–400)
RBC: 3.28 MIL/uL — ABNORMAL LOW (ref 3.87–5.11)
RDW: 19.2 % — ABNORMAL HIGH (ref 11.5–15.5)
WBC: 5.2 10*3/uL (ref 4.0–10.5)
nRBC: 0 % (ref 0.0–0.2)

## 2022-07-03 LAB — COMPREHENSIVE METABOLIC PANEL
ALT: 35 U/L (ref 0–44)
AST: 76 U/L — ABNORMAL HIGH (ref 15–41)
Albumin: 3.2 g/dL — ABNORMAL LOW (ref 3.5–5.0)
Alkaline Phosphatase: 121 U/L (ref 38–126)
Anion gap: 11 (ref 5–15)
BUN: 18 mg/dL (ref 6–20)
CO2: 23 mmol/L (ref 22–32)
Calcium: 8.6 mg/dL — ABNORMAL LOW (ref 8.9–10.3)
Chloride: 96 mmol/L — ABNORMAL LOW (ref 98–111)
Creatinine, Ser: 1.2 mg/dL — ABNORMAL HIGH (ref 0.44–1.00)
GFR, Estimated: 59 mL/min — ABNORMAL LOW (ref >60)
Glucose, Bld: 150 mg/dL — ABNORMAL HIGH (ref 70–99)
Potassium: 3.5 mmol/L (ref 3.5–5.1)
Sodium: 130 mmol/L — ABNORMAL LOW (ref 135–145)
Total Bilirubin: 1.9 mg/dL — ABNORMAL HIGH (ref 0.3–1.2)
Total Protein: 6.4 g/dL — ABNORMAL LOW (ref 6.5–8.1)

## 2022-07-03 LAB — LIPASE, BLOOD: Lipase: 39 U/L (ref 11–51)

## 2022-07-03 MED ORDER — SODIUM CHLORIDE 0.9 % IV SOLN
2.0000 g | Freq: Once | INTRAVENOUS | Status: AC
  Administered 2022-07-04: 2 g via INTRAVENOUS
  Filled 2022-07-03: qty 20

## 2022-07-03 MED ORDER — FENTANYL CITRATE (PF) 100 MCG/2ML IJ SOLN
100.0000 ug | Freq: Once | INTRAMUSCULAR | Status: AC
  Administered 2022-07-04: 100 ug via INTRAVENOUS
  Filled 2022-07-03: qty 2

## 2022-07-03 NOTE — ED Triage Notes (Signed)
Pt arrived via POV from home c/o cirrhosis like symptoms. Pt concerned about going into hepatic encephalopathy. Pt also states she is due for another paracentesis. Pt endorses decreased appetite and lethargic.

## 2022-07-04 ENCOUNTER — Emergency Department (HOSPITAL_COMMUNITY): Payer: Medicaid Other

## 2022-07-04 ENCOUNTER — Inpatient Hospital Stay (HOSPITAL_COMMUNITY): Payer: Medicaid Other

## 2022-07-04 ENCOUNTER — Encounter (HOSPITAL_COMMUNITY): Payer: Self-pay | Admitting: Internal Medicine

## 2022-07-04 DIAGNOSIS — R9431 Abnormal electrocardiogram [ECG] [EKG]: Secondary | ICD-10-CM

## 2022-07-04 DIAGNOSIS — K7031 Alcoholic cirrhosis of liver with ascites: Secondary | ICD-10-CM | POA: Diagnosis not present

## 2022-07-04 DIAGNOSIS — K652 Spontaneous bacterial peritonitis: Secondary | ICD-10-CM | POA: Diagnosis not present

## 2022-07-04 DIAGNOSIS — E871 Hypo-osmolality and hyponatremia: Secondary | ICD-10-CM

## 2022-07-04 DIAGNOSIS — E7220 Disorder of urea cycle metabolism, unspecified: Secondary | ICD-10-CM

## 2022-07-04 DIAGNOSIS — R1033 Periumbilical pain: Secondary | ICD-10-CM | POA: Diagnosis not present

## 2022-07-04 DIAGNOSIS — D649 Anemia, unspecified: Secondary | ICD-10-CM | POA: Diagnosis not present

## 2022-07-04 DIAGNOSIS — D638 Anemia in other chronic diseases classified elsewhere: Secondary | ICD-10-CM

## 2022-07-04 DIAGNOSIS — E8809 Other disorders of plasma-protein metabolism, not elsewhere classified: Secondary | ICD-10-CM

## 2022-07-04 DIAGNOSIS — R109 Unspecified abdominal pain: Secondary | ICD-10-CM | POA: Diagnosis not present

## 2022-07-04 DIAGNOSIS — E46 Unspecified protein-calorie malnutrition: Secondary | ICD-10-CM

## 2022-07-04 DIAGNOSIS — R1084 Generalized abdominal pain: Secondary | ICD-10-CM | POA: Diagnosis not present

## 2022-07-04 DIAGNOSIS — E872 Acidosis, unspecified: Secondary | ICD-10-CM | POA: Diagnosis not present

## 2022-07-04 DIAGNOSIS — D696 Thrombocytopenia, unspecified: Secondary | ICD-10-CM | POA: Diagnosis not present

## 2022-07-04 LAB — BODY FLUID CELL COUNT WITH DIFFERENTIAL
Eos, Fluid: 0 %
Lymphs, Fluid: 71 %
Monocyte-Macrophage-Serous Fluid: 25 % — ABNORMAL LOW (ref 50–90)
Neutrophil Count, Fluid: 4 % (ref 0–25)
Total Nucleated Cell Count, Fluid: 81 cu mm (ref 0–1000)

## 2022-07-04 LAB — COMPREHENSIVE METABOLIC PANEL
ALT: 30 U/L (ref 0–44)
AST: 68 U/L — ABNORMAL HIGH (ref 15–41)
Albumin: 2.8 g/dL — ABNORMAL LOW (ref 3.5–5.0)
Alkaline Phosphatase: 109 U/L (ref 38–126)
Anion gap: 10 (ref 5–15)
BUN: 17 mg/dL (ref 6–20)
CO2: 22 mmol/L (ref 22–32)
Calcium: 8.2 mg/dL — ABNORMAL LOW (ref 8.9–10.3)
Chloride: 97 mmol/L — ABNORMAL LOW (ref 98–111)
Creatinine, Ser: 1.13 mg/dL — ABNORMAL HIGH (ref 0.44–1.00)
GFR, Estimated: >60 mL/min (ref >60)
Glucose, Bld: 109 mg/dL — ABNORMAL HIGH (ref 70–99)
Potassium: 3.3 mmol/L — ABNORMAL LOW (ref 3.5–5.1)
Sodium: 129 mmol/L — ABNORMAL LOW (ref 135–145)
Total Bilirubin: 1.6 mg/dL — ABNORMAL HIGH (ref 0.3–1.2)
Total Protein: 5.7 g/dL — ABNORMAL LOW (ref 6.5–8.1)

## 2022-07-04 LAB — URINALYSIS, ROUTINE W REFLEX MICROSCOPIC
Bilirubin Urine: NEGATIVE
Glucose, UA: NEGATIVE mg/dL
Hgb urine dipstick: NEGATIVE
Ketones, ur: NEGATIVE mg/dL
Leukocytes,Ua: NEGATIVE
Nitrite: NEGATIVE
Protein, ur: NEGATIVE mg/dL
Specific Gravity, Urine: 1.01 (ref 1.005–1.030)
pH: 6 (ref 5.0–8.0)

## 2022-07-04 LAB — MAGNESIUM: Magnesium: 1.5 mg/dL — ABNORMAL LOW (ref 1.7–2.4)

## 2022-07-04 LAB — HIV ANTIBODY (ROUTINE TESTING W REFLEX): HIV Screen 4th Generation wRfx: NONREACTIVE

## 2022-07-04 LAB — GRAM STAIN

## 2022-07-04 LAB — CBC
HCT: 25.9 % — ABNORMAL LOW (ref 36.0–46.0)
Hemoglobin: 8.3 g/dL — ABNORMAL LOW (ref 12.0–15.0)
MCH: 28.3 pg (ref 26.0–34.0)
MCHC: 32 g/dL (ref 30.0–36.0)
MCV: 88.4 fL (ref 80.0–100.0)
Platelets: 98 10*3/uL — ABNORMAL LOW (ref 150–400)
RBC: 2.93 MIL/uL — ABNORMAL LOW (ref 3.87–5.11)
RDW: 19 % — ABNORMAL HIGH (ref 11.5–15.5)
WBC: 4.1 10*3/uL (ref 4.0–10.5)
nRBC: 0 % (ref 0.0–0.2)

## 2022-07-04 LAB — LACTATE DEHYDROGENASE, PLEURAL OR PERITONEAL FLUID: LD, Fluid: <25 U/L — ABNORMAL HIGH (ref 3–23)

## 2022-07-04 LAB — PROTEIN, PLEURAL OR PERITONEAL FLUID: Total protein, fluid: <3 g/dL

## 2022-07-04 LAB — PROTIME-INR
INR: 1.4 — ABNORMAL HIGH (ref 0.8–1.2)
Prothrombin Time: 17.2 seconds — ABNORMAL HIGH (ref 11.4–15.2)

## 2022-07-04 LAB — ALBUMIN, PLEURAL OR PERITONEAL FLUID: Albumin, Fluid: <1.5 g/dL

## 2022-07-04 LAB — CULTURE, BLOOD (ROUTINE X 2)
Culture: NO GROWTH
Special Requests: ADEQUATE

## 2022-07-04 LAB — AMMONIA: Ammonia: 40 umol/L — ABNORMAL HIGH (ref 9–35)

## 2022-07-04 LAB — LACTIC ACID, PLASMA: Lactic Acid, Venous: 2 mmol/L (ref 0.5–1.9)

## 2022-07-04 LAB — PHOSPHORUS: Phosphorus: 3.4 mg/dL (ref 2.5–4.6)

## 2022-07-04 MED ORDER — ALBUMIN HUMAN 25 % IV SOLN
25.0000 g | Freq: Once | INTRAVENOUS | Status: AC
  Administered 2022-07-04: 25 g via INTRAVENOUS

## 2022-07-04 MED ORDER — LACTULOSE 10 GM/15ML PO SOLN
20.0000 g | Freq: Three times a day (TID) | ORAL | Status: DC
  Administered 2022-07-04: 20 g via ORAL
  Filled 2022-07-04: qty 30

## 2022-07-04 MED ORDER — LACTULOSE 10 GM/15ML PO SOLN: 20.0000 g | Freq: Three times a day (TID) | ORAL | 3 refills | Status: AC

## 2022-07-04 MED ORDER — ALBUMIN HUMAN 25 % IV SOLN
INTRAVENOUS | Status: AC
  Filled 2022-07-04: qty 100

## 2022-07-04 MED ORDER — PANTOPRAZOLE SODIUM 40 MG IV SOLR
40.0000 mg | INTRAVENOUS | Status: DC
  Administered 2022-07-04: 40 mg via INTRAVENOUS
  Filled 2022-07-04: qty 10

## 2022-07-04 MED ORDER — MAGNESIUM SULFATE 4 GM/100ML IV SOLN
4.0000 g | Freq: Once | INTRAVENOUS | Status: AC
  Administered 2022-07-04: 4 g via INTRAVENOUS
  Filled 2022-07-04: qty 100

## 2022-07-04 MED ORDER — POTASSIUM CHLORIDE ER 10 MEQ PO TBCR: 10.0000 meq | EXTENDED_RELEASE_TABLET | Freq: Every day | ORAL | 2 refills | Status: DC

## 2022-07-04 MED ORDER — RIFAXIMIN 550 MG PO TABS
550.0000 mg | ORAL_TABLET | Freq: Two times a day (BID) | ORAL | Status: DC
  Administered 2022-07-04: 550 mg via ORAL
  Filled 2022-07-04 (×2): qty 1

## 2022-07-04 MED ORDER — POTASSIUM CHLORIDE CRYS ER 20 MEQ PO TBCR
40.0000 meq | EXTENDED_RELEASE_TABLET | ORAL | Status: DC
  Administered 2022-07-04: 40 meq via ORAL
  Filled 2022-07-04: qty 2

## 2022-07-04 MED ORDER — HYDROMORPHONE HCL 1 MG/ML IJ SOLN
0.5000 mg | INTRAMUSCULAR | Status: DC | PRN
  Administered 2022-07-04: 0.5 mg via INTRAVENOUS
  Filled 2022-07-04: qty 0.5

## 2022-07-04 MED ORDER — PROCHLORPERAZINE EDISYLATE 10 MG/2ML IJ SOLN
10.0000 mg | Freq: Four times a day (QID) | INTRAMUSCULAR | Status: DC | PRN
  Administered 2022-07-04: 10 mg via INTRAVENOUS
  Filled 2022-07-04: qty 2

## 2022-07-04 NOTE — Progress Notes (Signed)
Patient tolerated right sided paracentesis procedure and 25G of IV albumin well today and 1.2 Liters of clear yellow ascites removed and sent to lab for processing. Patient verbalized understanding of post procedure instructions and transported via stretcher back to inpatient bed assignment at this time with no acute distress noted.

## 2022-07-04 NOTE — Progress Notes (Signed)
MD ordered patient to receive albumin infusion during IR procedure, order placed on hold.

## 2022-07-04 NOTE — Discharge Instructions (Signed)
1)Please keep your appointment with the recent radiology group in [place] to discuss possible TIPS procedure  2)Please keep your appointment with [facility] liver transplant team as previously scheduled for 07/11/2022  3)Please take medications including lactulose as prescribed  4)Repeat CBC and CMP blood test within a week

## 2022-07-04 NOTE — Progress Notes (Signed)
Nsg Discharge Note  Admit Date:  07/03/2022 Discharge date: 07/04/2022   Erica Banks to be D/C'd Home per MD order.  AVS completed  Discharge Medication: Allergies as of 07/04/2022       Reactions   Demerol Anaphylaxis   Per patient cardiac arrest        Medication List     STOP taking these medications    ciprofloxacin 500 MG tablet Commonly known as: CIPRO       TAKE these medications    carvedilol 3.125 MG tablet Commonly known as: Coreg Take 1 tablet (3.125 mg total) by mouth 2 (two) times daily with a meal.   CertaVite/Antioxidants Tabs Take 1 tablet by mouth daily.   furosemide 40 MG tablet Commonly known as: LASIX Take 1 tablet (40 mg total) by mouth 2 (two) times daily. What changed:  how much to take when to take this   gabapentin 600 MG tablet Commonly known as: NEURONTIN Take 600 mg by mouth in the morning.   lactulose 10 GM/15ML solution Commonly known as: CHRONULAC Take 30 mLs (20 g total) by mouth 3 (three) times daily.   metoCLOPramide 10 MG tablet Commonly known as: REGLAN Take 10 mg by mouth every 6 (six) hours as needed.   oxyCODONE 5 MG immediate release tablet Commonly known as: Roxicodone Take 1 tablet (5 mg total) by mouth every 6 (six) hours as needed for severe pain.   pantoprazole 40 MG tablet Commonly known as: PROTONIX Take 1 tablet (40 mg total) by mouth 2 (two) times daily before a meal.   potassium chloride 10 MEQ tablet Commonly known as: KLOR-CON Take 1 tablet (10 mEq total) by mouth daily. Take While taking Lasix/furosemide   promethazine 12.5 MG tablet Commonly known as: PHENERGAN Take 12.5 mg by mouth every 6 (six) hours as needed for nausea or vomiting.   rifaximin 550 MG Tabs tablet Commonly known as: XIFAXAN Take 550 mg by mouth 2 (two) times daily.   spironolactone 100 MG tablet Commonly known as: Aldactone Take 1 tablet (100 mg total) by mouth 2 (two) times daily.        Discharge  Assessment: Vitals:   07/04/22 1104 07/04/22 1300  BP: 124/79 126/72  Pulse: 73 85  Resp: 16 18  Temp:  98.6 F (37 C)  SpO2: 99% 100%   Skin clean, dry and intact without evidence of skin break down, no evidence of skin tears noted. IV catheter discontinued intact. Site without signs and symptoms of complications - no redness or edema noted at insertion site, patient denies c/o pain - only slight tenderness at site.  Dressing with slight pressure applied.  D/c Instructions-Education: Discharge instructions given to patient/family with verbalized understanding. D/c education completed with patient/family including follow up instructions, medication list, d/c activities limitations if indicated, with other d/c instructions as indicated by MD - patient able to verbalize understanding, all questions fully answered. Patient instructed to return to ED, call 911, or call MD for any changes in condition.  Patient escorted via WC, and D/C home via private auto.  Laurena Spies, RN 07/04/2022 2:25 PM

## 2022-07-04 NOTE — ED Notes (Signed)
ED TO INPATIENT HANDOFF REPORT  ED Nurse Name and Phone #:   S Name/Age/Gender Erica Banks 42 y.o. female Room/Bed: APA12/APA12  Code Status   Code Status: Prior  Home/SNF/Other Home Patient oriented to: self, place, time, and situation Is this baseline? Yes   Triage Complete: Triage complete  Chief Complaint Abdominal pain [R10.9]  Triage Note Pt arrived via POV from home c/o cirrhosis like symptoms. Pt concerned about going into hepatic encephalopathy. Pt also states she is due for another paracentesis. Pt endorses decreased appetite and lethargic.    Allergies Allergies  Allergen Reactions   Demerol Anaphylaxis    Per patient cardiac arrest    Level of Care/Admitting Diagnosis ED Disposition     ED Disposition  Admit   Condition  --   Comment  Hospital Area: [facility] [[id number]]  Level of Care: Med-Surg [16]  Covid Evaluation: Asymptomatic - no recent exposure (last 10 days) testing not required  Diagnosis: Abdominal pain [744753]  Admitting Physician: Frankey Shown [[id number]]  Attending Physician: Frankey Shown [[id number]]  Certification:: I certify this patient will need inpatient services for at least 2 midnights  Estimated Length of Stay: 3          B Medical/Surgery History Past Medical History:  Diagnosis Date   Anemia    Cirrhosis (HCC)    DVT (deep venous thrombosis) (HCC)    Low iron    Neuropathy    Neuropathy    Panic attacks    PONV (postoperative nausea and vomiting)    Pulmonary embolus (HCC) 2019   Past Surgical History:  Procedure Laterality Date   ABDOMINAL HYSTERECTOMY     BIOPSY N/A 06/24/2013   Procedure: BIOPSY TERMINAL ILEUM;  Surgeon: Corbin Ade, MD;  [facility];  Service: Endoscopy;  Laterality: N/A;   CHOLECYSTECTOMY     COLONOSCOPY N/A 06/24/2013   [id number] hemorrhoids/otherwise normal    COLONOSCOPY WITH PROPOFOL N/A 05/31/2021   Surgeon: Corbin Ade, MD; ery large  nonbleeding internal and external hemorrhoids, portal colopathy.  Recommended repeat colonoscopy in 10 years.   ESOPHAGEAL BANDING  03/21/2022   Procedure: ESOPHAGEAL BANDING;  Surgeon: [name], MD;  Location: [facility] ENDOSCOPY;  Service: Gastroenterology;;   ESOPHAGEAL BANDING N/A 04/19/2022   Procedure: ESOPHAGEAL BANDING;  Surgeon: Corbin Ade, MD;  [facility];  Service: Endoscopy;  Laterality: N/A;   ESOPHAGOGASTRODUODENOSCOPY (EGD) WITH PROPOFOL N/A 03/29/2021   Surgeon: Lanelle Bal, DO;  grade 1 esophageal varices, portal hypertensive gastropathy with friable tissue with spontaneous ooze, normal duodenum.   ESOPHAGOGASTRODUODENOSCOPY (EGD) WITH PROPOFOL N/A 05/31/2021   Surgeon: Corbin Ade, MD;  grade 1-2 esophageal varices without bleeding stigmata, advanced appearing portal hypertensive gastropathy.   ESOPHAGOGASTRODUODENOSCOPY (EGD) WITH PROPOFOL N/A 12/22/2021   Procedure: ESOPHAGOGASTRODUODENOSCOPY (EGD) WITH PROPOFOL;  Surgeon: Dolores Frame, MD;  [facility];  Service: Gastroenterology;  Laterality: N/A;   ESOPHAGOGASTRODUODENOSCOPY (EGD) WITH PROPOFOL N/A 03/21/2022   Surgeon: [name], MD;  actively bleeding esophageal varices s/p banding x 5 with bleeding resolved, portal hypertensive gastropathy.   ESOPHAGOGASTRODUODENOSCOPY (EGD) WITH PROPOFOL N/A 04/19/2022   Procedure: ESOPHAGOGASTRODUODENOSCOPY (EGD) WITH PROPOFOL;  Surgeon: Corbin Ade, MD;  [facility];  Service: Endoscopy;  Laterality: N/A;  1:45 pm, pt knows to arrive at 9:45   IR PARACENTESIS  03/13/2022   IR PARACENTESIS  03/25/2022   nasal bone surgery     NASAL SINUS SURGERY  SINUS IRRIGATION     TONSILLECTOMY     TUBAL LIGATION       A IV Location/Drains/Wounds Patient Lines/Drains/Airways Status     Active Line/Drains/Airways     Name Placement date Placement time Site Days   Peripheral IV 07/04/22 20 G 1" Left Forearm  07/04/22  0006  Forearm  less than 1   Wound / Incision (Open or Dehisced) 03/21/22 Non-pressure wound;Other (Comment) Heel Right DTI 03/21/22  1600  Heel  105            Intake/Output Last 24 hours No intake or output data in the 24 hours ending 07/04/22 0321  Labs/Imaging Results for orders placed or performed during the hospital encounter of 07/03/22 (from the past 48 hour(s))  Lipase, blood     Status: None   Collection Time: 07/03/22 11:35 PM  Result Value Ref Range   Lipase 39 11 - 51 U/L    Comment: Performed at [facility], [address], [place], [place] [address]  Ammonia     Status: Abnormal   Collection Time: 07/03/22 11:35 PM  Result Value Ref Range   Ammonia 40 (H) 9 - 35 umol/L    Comment: Performed at [facility], [address] [place], [place] [address]  CBC with Differential     Status: Abnormal   Collection Time: 07/03/22 11:35 PM  Result Value Ref Range   WBC 5.2 4.0 - 10.5 K/uL   RBC 3.28 (L) 3.87 - 5.11 MIL/uL   Hemoglobin 9.4 (L) 12.0 - 15.0 g/dL   HCT 09.8 (L) 11.9 - 14.7 %   MCV 88.7 80.0 - 100.0 fL   MCH 28.7 26.0 - 34.0 pg   MCHC 32.3 30.0 - 36.0 g/dL   RDW 82.9 (H) 56.2 - 13.0 %   Platelets 109 (L) 150 - 400 K/uL   nRBC 0.0 0.0 - 0.2 %   Neutrophils Relative % 69 %   Neutro Abs 3.6 1.7 - 7.7 K/uL   Lymphocytes Relative 16 %   Lymphs Abs 0.8 0.7 - 4.0 K/uL   Monocytes Relative 12 %   Monocytes Absolute 0.6 0.1 - 1.0 K/uL   Eosinophils Relative 2 %   Eosinophils Absolute 0.1 0.0 - 0.5 K/uL   Basophils Relative 1 %   Basophils Absolute 0.0 0.0 - 0.1 K/uL   Immature Granulocytes 0 %   Abs Immature Granulocytes 0.02 0.00 - 0.07 K/uL    Comment: Performed at [facility], [address], [place], [place] [address]  Comprehensive metabolic panel     Status: Abnormal   Collection Time: 07/03/22 11:35 PM  Result Value Ref Range   Sodium 130 (L) 135 - 145 mmol/L   Potassium 3.5 3.5 - 5.1 mmol/L   Chloride 96 (L) 98 - 111 mmol/L   CO2 23 22 -  32 mmol/L   Glucose, Bld 150 (H) 70 - 99 mg/dL    Comment: Glucose reference range applies only to samples taken after fasting for at least 8 hours.   BUN 18 6 - 20 mg/dL   Creatinine, Ser 4.69 (H) 0.44 - 1.00 mg/dL   Calcium 8.6 (L) 8.9 - 10.3 mg/dL   Total Protein 6.4 (L) 6.5 - 8.1 g/dL   Albumin 3.2 (L) 3.5 - 5.0 g/dL   AST 76 (H) 15 - 41 U/L   ALT 35 0 - 44 U/L   Alkaline Phosphatase 121 38 - 126 U/L   Total Bilirubin 1.9 (H) 0.3 - 1.2 mg/dL  GFR, Estimated 59 (L) >60 mL/min    Comment: (NOTE) Calculated using the CKD-EPI Creatinine Equation (2021)    Anion gap 11 5 - 15    Comment: Performed at [facility], [address], [place], [place] [address]  Protime-INR     Status: Abnormal   Collection Time: 07/03/22 11:35 PM  Result Value Ref Range   Prothrombin Time 17.2 (H) 11.4 - 15.2 seconds   INR 1.4 (H) 0.8 - 1.2    Comment: (NOTE) INR goal varies based on device and disease states. Performed at [facility], [address], [place], [place] [address]   Lactic acid, plasma     Status: Abnormal   Collection Time: 07/04/22 12:02 AM  Result Value Ref Range   Lactic Acid, Venous 2.0 (HH) 0.5 - 1.9 mmol/L    Comment: CRITICAL RESULT CALLED TO, READ BACK BY AND VERIFIED WITH M. MOSTELLER AT 0029 ON 06.06.24 BY ADGER J Performed at [facility], [address], [place], [place] [address]   Culture, blood (Routine X 2) w Reflex to ID Panel     Status: None (Preliminary result)   Collection Time: 07/04/22 12:02 AM   Specimen: BLOOD  Result Value Ref Range   Specimen Description BLOOD LEFT ANTECUBITAL    Special Requests      BOTTLES DRAWN AEROBIC AND ANAEROBIC Blood Culture adequate volume Performed at [facility], [address] [place], [place] [address]    Culture PENDING    Report Status PENDING   Culture, blood (Routine X 2) w Reflex to ID Panel     Status: None (Preliminary result)   Collection Time: 07/04/22 12:04 AM   Specimen: BLOOD  Result Value Ref Range    Specimen Description BLOOD BLOOD LEFT FOREARM    Special Requests      BOTTLES DRAWN AEROBIC AND ANAEROBIC Blood Culture adequate volume Performed at [facility], [address], [place], [place] [address]    Culture PENDING    Report Status PENDING   Urinalysis, Routine w reflex microscopic -Urine, Clean Catch     Status: None   Collection Time: 07/04/22 12:05 AM  Result Value Ref Range   Color, Urine YELLOW YELLOW   APPearance CLEAR CLEAR   Specific Gravity, Urine 1.010 1.005 - 1.030   pH 6.0 5.0 - 8.0   Glucose, UA NEGATIVE NEGATIVE mg/dL   Hgb urine dipstick NEGATIVE NEGATIVE   Bilirubin Urine NEGATIVE NEGATIVE   Ketones, ur NEGATIVE NEGATIVE mg/dL   Protein, ur NEGATIVE NEGATIVE mg/dL   Nitrite NEGATIVE NEGATIVE   Leukocytes,Ua NEGATIVE NEGATIVE    Comment: Performed at [facility], [address] [place], [place] [address]   DG Chest Port 1 View  Result Date: 07/04/2022 CLINICAL DATA:  Fever and sepsis EXAM: PORTABLE CHEST 1 VIEW COMPARISON:  Radiographs 03/22/2022 FINDINGS: Stable cardiomediastinal silhouette. No focal consolidation, pleural effusion, or pneumothorax. No displaced rib fractures. IMPRESSION: No active disease. Electronically Signed   By: Minerva Fester M.D.   On: 07/04/2022 02:39    Pending Labs Unresulted Labs (From admission, onward)    None       Vitals/Pain Today's Vitals   07/04/22 0200 07/04/22 0215 07/04/22 0245 07/04/22 0256  BP: (!) 101/54     Pulse: 86     Resp:  12 14   Temp:    99.1 F (37.3 C)  TempSrc:    Oral  SpO2: 96%     Weight:      Height:  PainSc:        Isolation Precautions No active isolations  Medications Medications  cefTRIAXone (ROCEPHIN) 2 g in sodium chloride 0.9 % 100 mL IVPB (0 g Intravenous Stopped 07/04/22 0213)  fentaNYL (SUBLIMAZE) injection 100 mcg (100 mcg Intravenous Given 07/04/22 0016)    Mobility walks     Focused Assessments    R Recommendations: See Admitting Provider Note  Report  given to:   Additional Notes:

## 2022-07-04 NOTE — H&P (Addendum)
History and Physical    Patient: Erica Banks:[id number] DOB: [birth year] DOA: 07/03/2022 DOS: the patient was seen and examined on 07/04/2022 PCP: Erica Deiters, MD  Patient coming from: Home  Chief Complaint:  Chief Complaint  Patient presents with   Cirrhosis   HPI: Erica ALEYDIS Banks is a 42 y.o. female with medical history significant of alcoholic cirrhosis with ascites who presents to the emergency department due to 1 week history of not feeling well.  She complained of having chills evening which usually resolve during the day, however, in the last 2 days, the symptoms last throughout the day and was associated with increased nausea, decreased appetite, weakness, fatigue. Patient felt that she might be experiencing elevated ammonia, so she took elevated doses of lactulose which has resulted in diarrhea, but still felt unwell.  She also complained of distended abdomen with diffuse abdominal pain and thought that she was due for paracentesis, so she presented to the ED for further evaluation and management. She was admitted to [facility] from 2/21 to 2/27 due to acute upper GI bleed likely due to esophageal varices Banks/p banding x 5/portal hypertension/decompensated alcoholic cirrhosis. 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  Workup in the ED showed normocytic anemia, platelets of 109.  BMP showed sodium of 130, output is, bicarb 20, blood glucose 150, BUN 18, lipase 39, ammonia 40, lactic acid 2.0.  Blood culture pending. Chest x-ray no active  disease Patient was treated with ceftriaxone, fentanyl was given.  Hospitalist was asked admit patient for further evaluation and management.  Review of Systems: Review of systems as noted in the HPI. All other systems reviewed and are negative.   Past Medical History:  Diagnosis Date   Anemia    Cirrhosis (HCC)    DVT (deep venous thrombosis) (HCC)    Low iron    Neuropathy    Neuropathy    Panic attacks    PONV (postoperative nausea and vomiting)    Pulmonary embolus (HCC) 2019   Past Surgical History:  Procedure Laterality Date   ABDOMINAL HYSTERECTOMY     BIOPSY N/A 06/24/2013   Procedure: BIOPSY TERMINAL ILEUM;  Surgeon: Erica Ade, MD;  [facility];  Service: Endoscopy;  Laterality: N/A;   CHOLECYSTECTOMY     COLONOSCOPY N/A 06/24/2013   [id number] hemorrhoids/otherwise normal    COLONOSCOPY WITH PROPOFOL N/A 05/31/2021   Surgeon: Erica Ade, MD; ery large nonbleeding internal and external hemorrhoids, portal colopathy.  Recommended repeat colonoscopy in 10 years.   ESOPHAGEAL BANDING  03/21/2022   Procedure: ESOPHAGEAL BANDING;  Surgeon: Erica Danas, MD;  Location: [facility] ENDOSCOPY;  Service: Gastroenterology;;   ESOPHAGEAL BANDING N/A 04/19/2022   Procedure: ESOPHAGEAL BANDING;  Surgeon: Erica Ade, MD;  [facility];  Service: Endoscopy;  Laterality: N/A;   ESOPHAGOGASTRODUODENOSCOPY (EGD) WITH PROPOFOL N/A 03/29/2021   Surgeon: Erica Bal, DO;  grade 1 esophageal varices, portal hypertensive gastropathy with friable tissue with spontaneous ooze, normal duodenum.   ESOPHAGOGASTRODUODENOSCOPY (EGD) WITH PROPOFOL N/A 05/31/2021  Surgeon: Erica Ade, MD;  grade 1-2 esophageal varices without bleeding stigmata, advanced appearing portal hypertensive gastropathy.   ESOPHAGOGASTRODUODENOSCOPY (EGD) WITH PROPOFOL N/A 12/22/2021   Procedure: ESOPHAGOGASTRODUODENOSCOPY (EGD) WITH PROPOFOL;  Surgeon: Erica Frame,  MD;  [facility];  Service: Gastroenterology;  Laterality: N/A;   ESOPHAGOGASTRODUODENOSCOPY (EGD) WITH PROPOFOL N/A 03/21/2022   Surgeon: Erica Danas, MD;  actively bleeding esophageal varices Banks/p banding x 5 with bleeding resolved, portal hypertensive gastropathy.   ESOPHAGOGASTRODUODENOSCOPY (EGD) WITH PROPOFOL N/A 04/19/2022   Procedure: ESOPHAGOGASTRODUODENOSCOPY (EGD) WITH PROPOFOL;  Surgeon: Erica Ade, MD;  [facility];  Service: Endoscopy;  Laterality: N/A;  1:45 pm, pt knows to arrive at 9:45   IR PARACENTESIS  03/13/2022   IR PARACENTESIS  03/25/2022   nasal bone surgery     NASAL SINUS SURGERY     SINUS IRRIGATION     TONSILLECTOMY     TUBAL LIGATION      Social History:  reports that she has been smoking cigarettes. She has a 6.00 pack-year smoking history. She has never used smokeless tobacco. She reports that she does not currently use alcohol after a past usage of about 4.0 - 5.0 standard drinks of alcohol per week. She reports that she does not currently use drugs after having used the following drugs: Marijuana.   Allergies  Allergen Reactions   Demerol Anaphylaxis    Per patient cardiac arrest    Family History  Problem Relation Age of Onset   Diabetes Mother    Hypertension Mother    COPD Mother    Cirrhosis Father        ETOH   Diabetes Sister    Hypertension Sister    Crohn'Banks disease Maternal Grandmother    Cancer Other    Colon cancer Neg Hx    Autoimmune disease Neg Hx      Prior to Admission medications   Medication Sig Start Date End Date Taking? Authorizing Provider  carvedilol (COREG) 3.125 MG tablet Take 1 tablet (3.125 mg total) by mouth 2 (two) times daily with a meal. 05/16/22  Yes Erica Banks, Erica S, PA-C  furosemide (LASIX) 40 MG tablet Take 1 tablet (40 mg total) by mouth 2 (two) times daily. Patient taking differently: Take 80 mg by mouth in the morning. 03/18/22  Yes Lonia Blood, MD  gabapentin  (NEURONTIN) 600 MG tablet Take 600 mg by mouth at bedtime. 04/23/22  Yes [provider]  lactulose (CHRONULAC) 10 GM/15ML solution Take 20 g by mouth 3 (three) times daily. 04/03/22 04/03/23 Yes [provider]  Multiple Vitamin (MULTIVITAMIN WITH MINERALS) TABS tablet Take 1 tablet by mouth daily. 03/18/22  Yes Lonia Blood, MD  oxyCODONE (ROXICODONE) 5 MG immediate release tablet Take 1 tablet (5 mg total) by mouth every 6 (six) hours as needed for severe pain. 05/30/22  Yes Bethann Berkshire, MD  pantoprazole (PROTONIX) 40 MG tablet Take 1 tablet (40 mg total) by mouth 2 (two) times daily before a meal. 03/18/22  Yes Lonia Blood, MD  rifaximin (XIFAXAN) 550 MG TABS tablet Take 550 mg by mouth 2 (two) times daily. 02/23/22 06/27/23 Yes [provider]  spironolactone (ALDACTONE) 100 MG tablet Take 1 tablet (100 mg total) by mouth 2 (two) times daily. 05/16/22  Yes Letta Median, PA-C  metoCLOPramide (REGLAN) 10 MG tablet Take 10 mg by mouth every 6 (six) hours as needed. 12/04/21   [provider]  promethazine (PHENERGAN) 12.5 MG  tablet Take 12.5 mg by mouth every 6 (six) hours as needed for nausea or vomiting.    [provider]  sulfamethoxazole-trimethoprim (BACTRIM DS) 800-160 MG tablet Take 1 tablet by mouth daily. 04/04/22   Letta Median, PA-C    Physical Exam: BP 127/74 (BP Location: Left Arm)   Pulse 86   Temp 98.7 F (37.1 C) (Oral)   Resp 18   Ht 5\' 6"  (1.676 m)   Wt 66.9 kg   LMP  (LMP Unknown)   SpO2 100%   BMI 23.81 kg/m   General: 42 y.o. year-old female well developed well nourished in no acute distress.  Alert and oriented x3. HEENT: NCAT, EOMI Neck: Supple, trachea medial Cardiovascular: Regular rate and rhythm with no rubs or gallops.  No thyromegaly or JVD noted.  No lower extremity edema. 2/4 pulses in all 4 extremities. Respiratory: Clear to auscultation with no wheezes or rales. Good inspiratory effort. Abdomen:  Soft, diffuse tenderness to palpation with guarding.  Noted umbilical hernia.  Distended with normal bowel sounds x4 quadrants. Muskuloskeletal: No cyanosis, clubbing or edema noted bilaterally Neuro: CN II-XII intact, strength 5/5 x 4, sensation, reflexes intact Skin: No ulcerative lesions noted or rashes Psychiatry: Judgement and insight appear normal. Mood is appropriate for condition and setting          Labs on Admission:  Basic Metabolic Panel: Recent Labs  Lab 07/03/22 2335  NA 130*  K 3.5  CL 96*  CO2 23  GLUCOSE 150*  BUN 18  CREATININE 1.20*  CALCIUM 8.6*   Liver Function Tests: Recent Labs  Lab 07/03/22 2335  AST 76*  ALT 35  ALKPHOS 121  BILITOT 1.9*  PROT 6.4*  ALBUMIN 3.2*   Recent Labs  Lab 07/03/22 2335  LIPASE 39   Recent Labs  Lab 07/03/22 2335  AMMONIA 40*   CBC: Recent Labs  Lab 07/03/22 2335  WBC 5.2  NEUTROABS 3.6  HGB 9.4*  HCT 29.1*  MCV 88.7  PLT 109*   Cardiac Enzymes: No results for input(Banks): "CKTOTAL", "CKMB", "CKMBINDEX", "TROPONINI" in the last 168 hours.  BNP (last 3 results) No results for input(Banks): "BNP" in the last 8760 hours.  ProBNP (last 3 results) No results for input(Banks): "PROBNP" in the last 8760 hours.  CBG: No results for input(Banks): "GLUCAP" in the last 168 hours.  Radiological Exams on Admission: DG Chest Port 1 View  Result Date: 07/04/2022 CLINICAL DATA:  Fever and sepsis EXAM: PORTABLE CHEST 1 VIEW COMPARISON:  Radiographs 03/22/2022 FINDINGS: Stable cardiomediastinal silhouette. No focal consolidation, pleural effusion, or pneumothorax. No displaced rib fractures. IMPRESSION: No active disease. Electronically Signed   By: Minerva Fester M.D.   On: 07/04/2022 02:39    EKG: I independently viewed the EKG done and my findings are as followed: Normal sinus rhythm at rate of 87 bpm with QTc 502 ms  Assessment/Plan Present on Admission:  Abdominal pain  Hypoalbuminemia  Hyponatremia  Hyperammonemia  (HCC)  Prolonged QT interval  Anemia of chronic disease  Principal Problem:   Abdominal pain Active Problems:   Anemia of chronic disease   Prolonged QT interval   Hyponatremia   Hypoalbuminemia   Hyperammonemia (HCC)   Lactic acidosis   Thrombocytopenia (HCC)  Abdominal pain possibly due to ascites, rule out spontaneous bacterial peritonitis Alcoholic cirrhosis Patient was empirically started on IV ceftriaxone IR will be consulted for diagnostic paracentesis to rule out SBP Continue Zofran as needed Continue Dilaudid as needed Continue Protonix  Patient will be placed n.p.o. pending paracentesis Lasix and spironolactone will be held at this time due to soft BP  Lactic acidosis Lactic acid 2.0, continue to trend lactic acid  Hyperammonemia Ammonia level 49 this was 175 on 03/22/2022 Continue home lactulose and rifaximin  Chronic hyponatremia This is possibly due to cirrhosis and poor p.o. intake Sodium 130, patient'Banks sodium usually ranged within 124-130  Hypoalbuminemia possibly due to alcoholic cirrhosis Albumin 3.2, this will be replenished during paracentesis  Prolonged QT interval QTc 502 ms Avoid QT prolonging drugs Magnesium level will be checked Repeat EKG in the morning  Thrombocytopenia possibly due to alcoholic cirrhosis Platelets 109, no sign of bleeding Continue to monitor platelet levels with morning labs  Anemia of chronic disease due to alcoholic cirrhosis Hemoglobin 9.4, stable, continue to monitor hemoglobin level with morning labs   DVT prophylaxis: SCDs (no chemoprophylaxis due to recent history of upper GI bleed)  Advance Care Planning: Full code  Consults: IR  Family Communication: None at bedside.  Severity of Illness: The appropriate patient status for this patient is INPATIENT. Inpatient status is judged to be reasonable and necessary in order to provide the required intensity of service to ensure the patient'Banks safety. The patient'Banks  presenting symptoms, physical exam findings, and initial radiographic and laboratory data in the context of their chronic comorbidities is felt to place them at high risk for further clinical deterioration. Furthermore, it is not anticipated that the patient will be medically stable for discharge from the hospital within 2 midnights of admission.   * I certify that at the point of admission it is my clinical judgment that the patient will require inpatient hospital care spanning beyond 2 midnights from the point of admission due to high intensity of service, high risk for further deterioration and high frequency of surveillance required.*  Author: Frankey Shown, DO 07/04/2022 4:53 AM  For on call review [email or web address].

## 2022-07-04 NOTE — TOC Initial Note (Signed)
Transition of Care [facility]) - Initial/Assessment Note    Patient Details  Name: Erica Banks MRN: [id number] Date of Birth: 12-Feb-1980  Transition of Care [facility]) CM/SW Contact:    Annice Needy, LCSW Phone Number: 07/04/2022, 12:43 PM  Clinical Narrative:                 Patient from home with spouse and adult son. Admitted for abdominal pain. Considered high risk for readmission. Independent with ADLs, uses a cane, has shower chair, drives. Has no reported unmet needs.   Expected Discharge Plan: Home/Self Care Barriers to Discharge: Continued Medical Work up   Patient Goals and CMS Choice Patient states their goals for this hospitalization and ongoing recovery are:: return home          Expected Discharge Plan and Services       Living arrangements for the past 2 months: Single Family Home                                      Prior Living Arrangements/Services Living arrangements for the past 2 months: Single Family Home Lives with:: Adult Children, Spouse Patient language and need for interpreter reviewed:: Yes Do you feel safe going back to the place where you live?: Yes      Need for Family Participation in Patient Care: Yes (Comment) Care giver support system in place?: Yes (comment) Current home services: DME (shower chair, cane) Criminal Activity/Legal Involvement Pertinent to Current Situation/Hospitalization: No - Comment as needed  Activities of Daily Living Home Assistive Devices/Equipment: None ADL Screening (condition at time of admission) Patient's cognitive ability adequate to safely complete daily activities?: Yes Is the patient deaf or have difficulty hearing?: No Does the patient have difficulty seeing, even when wearing glasses/contacts?: No Does the patient have difficulty concentrating, remembering, or making decisions?: No Patient able to express need for assistance with ADLs?: No Does the patient have difficulty dressing or  bathing?: No Independently performs ADLs?: Yes (appropriate for developmental age) Does the patient have difficulty walking or climbing stairs?: No Weakness of Legs: None Weakness of Arms/Hands: None  Permission Sought/Granted                  Emotional Assessment     Affect (typically observed): Appropriate Orientation: : Oriented to Self, Oriented to Place, Oriented to  Time, Oriented to Situation Alcohol / Substance Use: Not Applicable Psych Involvement: No (comment)  Admission diagnosis:  SBP (spontaneous bacterial peritonitis) (HCC) [K65.2] Abdominal pain [R10.9] Patient Active Problem List   Diagnosis Date Noted   Lactic acidosis 07/04/2022   Thrombocytopenia (HCC) 07/04/2022   History of spontaneous bacterial peritonitis 04/04/2022   Portal hypertension (HCC) 03/23/2022   Encephalopathy, hepatic (HCC) 03/23/2022   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 (HCC) 03/21/2022   Urinary tract infection due to ESBL Klebsiella 03/08/2022   Protein-calorie malnutrition, severe 03/08/2022   Decompensated hepatic cirrhosis (HCC) 03/04/2022   Acute respiratory failure with hypoxemia (HCC) 03/01/2022   Hepatic encephalopathy (HCC) 03/01/2022   Transaminitis 02/14/2022   Abdominal pain 02/14/2022   Acute hepatic encephalopathy (HCC) 02/10/2022   Hemorrhoids 02/10/2022   Generalized weakness 12/19/2021   AKI (acute kidney injury) (HCC) 12/19/2021   Hyperammonemia (HCC) 12/19/2021   Hypomagnesemia 12/19/2021   Ascites due to alcoholic cirrhosis (HCC) 12/18/2021   Hypocalcemia 11/23/2021   Hypoalbuminemia 11/23/2021   Tobacco use disorder 11/23/2021  Esophageal varices without bleeding (HCC) 10/31/2021   Heartburn 08/23/2021   Nausea without vomiting 08/23/2021   Lesion of liver less than 1 cm in diameter    Acute blood loss anemia 03/27/2021   Alcoholic cirrhosis of liver with ascites (HCC) 03/27/2021   Hyponatremia 03/27/2021   Iron deficiency  anemia 03/27/2021   Folate deficiency 03/27/2021   Ascites    Cirrhosis of liver with ascites (HCC)    Intractable nausea and vomiting 07/20/2019   Cholelithiasis 07/20/2019   Prolonged QT interval 07/20/2019   Anemia 01/01/2015   Anemia of chronic disease 01/01/2015   Hypokalemia 01/01/2015   Acute gastroenteritis 01/01/2015   Abnormal CT of the abdomen 06/08/2013   Gastroesophageal reflux disease 06/08/2013   PCP:  Toma Deiters, MD Pharmacy:   [facility] - [place], [place] - [address] [place] [address] Phone: [phone] Fax: [phone]  [facility] [address] [place] [address] Phone: [phone] Fax: [phone]     Social Determinants of Health (SDOH) Social History: SDOH Screenings   Food Insecurity: No Food Insecurity (03/08/2022)  Housing: Low Risk  (03/08/2022)  Transportation Needs: No Transportation Needs (03/08/2022)  Utilities: Not At Risk (03/08/2022)  Tobacco Use: High Risk (07/04/2022)   SDOH Interventions:     Readmission Risk Interventions    02/14/2022    2:11 PM 12/19/2021    8:54 AM  Readmission Risk Prevention Plan  Transportation Screening Complete Complete  HRI or Home Care Consult Complete Complete  Social Work Consult for Recovery Care Planning/Counseling Complete Complete  Palliative Care Screening Not Applicable Not Applicable  Medication Review Oceanographer) Complete Complete

## 2022-07-04 NOTE — ED Provider Notes (Signed)
[name] Provider Note   CSN: [id number] Arrival date & time: 07/03/22  2252     History  Chief Complaint  Patient presents with   Cirrhosis    Erica Banks is a 42 y.o. female.  Patient presents to the emergency department with concerns over not feeling well.  Patient reports that she has felt off today and thought she might be experiencing elevated ammonia.  She has taken increased doses of lactulose for this, has had diarrhea but no change in her other symptoms.  Patient does report that her abdomen is distended.  She is due for paracentesis.  Patient complaining of diffuse abdominal pain.       Home Medications Prior to Admission medications   Medication Sig Start Date End Date Taking? Authorizing Provider  carvedilol (COREG) 3.125 MG tablet Take 1 tablet (3.125 mg total) by mouth 2 (two) times daily with a meal. 05/16/22  Yes Clearance Coots, Kristen S, PA-C  furosemide (LASIX) 40 MG tablet Take 1 tablet (40 mg total) by mouth 2 (two) times daily. Patient taking differently: Take 80 mg by mouth in the morning. 03/18/22  Yes Lonia Blood, MD  gabapentin (NEURONTIN) 600 MG tablet Take 600 mg by mouth at bedtime. 04/23/22  Yes [provider]  lactulose (CHRONULAC) 10 GM/15ML solution Take 20 g by mouth 3 (three) times daily. 04/03/22 04/03/23 Yes [provider]  Multiple Vitamin (MULTIVITAMIN WITH MINERALS) TABS tablet Take 1 tablet by mouth daily. 03/18/22  Yes Lonia Blood, MD  oxyCODONE (ROXICODONE) 5 MG immediate release tablet Take 1 tablet (5 mg total) by mouth every 6 (six) hours as needed for severe pain. 05/30/22  Yes Bethann Berkshire, MD  pantoprazole (PROTONIX) 40 MG tablet Take 1 tablet (40 mg total) by mouth 2 (two) times daily before a meal. 03/18/22  Yes Lonia Blood, MD  rifaximin (XIFAXAN) 550 MG TABS tablet Take 550 mg by mouth 2 (two) times daily. 02/23/22 06/27/23 Yes [provider]   spironolactone (ALDACTONE) 100 MG tablet Take 1 tablet (100 mg total) by mouth 2 (two) times daily. 05/16/22  Yes Letta Median, PA-C  metoCLOPramide (REGLAN) 10 MG tablet Take 10 mg by mouth every 6 (six) hours as needed. 12/04/21   [provider]  promethazine (PHENERGAN) 12.5 MG tablet Take 12.5 mg by mouth every 6 (six) hours as needed for nausea or vomiting.    [provider]  sulfamethoxazole-trimethoprim (BACTRIM DS) 800-160 MG tablet Take 1 tablet by mouth daily. 04/04/22   Letta Median, PA-C      Allergies    Demerol    Review of Systems   Review of Systems  Physical Exam Updated Vital Signs BP 104/66   Pulse 99   Temp (!) 101.2 F (38.4 C) (Oral)   Resp 17   Ht 5\' 6"  (1.676 m)   Wt 65 kg   LMP  (LMP Unknown)   SpO2 96%   BMI 23.13 kg/m  Physical Exam Vitals and nursing note reviewed.  Constitutional:      General: She is not in acute distress.    Appearance: She is well-developed.  HENT:     Head: Normocephalic and atraumatic.     Mouth/Throat:     Mouth: Mucous membranes are moist.  Eyes:     General: Vision grossly intact. Gaze aligned appropriately.     Extraocular Movements: Extraocular movements intact.     Conjunctiva/sclera: Conjunctivae normal.  Cardiovascular:     Rate and Rhythm: Normal rate and regular rhythm.     Pulses: Normal pulses.     Heart sounds: Normal heart sounds, S1 normal and S2 normal. No murmur heard.    No friction rub. No gallop.  Pulmonary:     Effort: Pulmonary effort is normal. No respiratory distress.     Breath sounds: Normal breath sounds.  Abdominal:     General: Bowel sounds are normal.     Palpations: Abdomen is soft.     Tenderness: There is generalized abdominal tenderness. There is no guarding or rebound.     Hernia: No hernia is present.  Musculoskeletal:        General: No swelling.     Cervical back: Full passive range of motion without pain, normal range of motion and neck supple. No  spinous process tenderness or muscular tenderness. Normal range of motion.     Right lower leg: No edema.     Left lower leg: No edema.  Skin:    General: Skin is warm and dry.     Capillary Refill: Capillary refill takes less than 2 seconds.     Findings: No ecchymosis, erythema, rash or wound.  Neurological:     General: No focal deficit present.     Mental Status: She is alert and oriented to person, place, and time.     GCS: GCS eye subscore is 4. GCS verbal subscore is 5. GCS motor subscore is 6.     Cranial Nerves: Cranial nerves 2-12 are intact.     Sensory: Sensation is intact.     Motor: Motor function is intact.     Coordination: Coordination is intact.  Psychiatric:        Attention and Perception: Attention normal.        Mood and Affect: Mood normal.        Speech: Speech normal.        Behavior: Behavior normal.     ED Results / Procedures / Treatments   Labs (all labs ordered are listed, but only abnormal results are displayed) Labs Reviewed  AMMONIA - Abnormal; Notable for the following components:      Result Value   Ammonia 40 (*)    All other components within normal limits  CBC WITH DIFFERENTIAL/PLATELET - Abnormal; Notable for the following components:   RBC 3.28 (*)    Hemoglobin 9.4 (*)    HCT 29.1 (*)    RDW 19.2 (*)    Platelets 109 (*)    All other components within normal limits  COMPREHENSIVE METABOLIC PANEL - Abnormal; Notable for the following components:   Sodium 130 (*)    Chloride 96 (*)    Glucose, Bld 150 (*)    Creatinine, Ser 1.20 (*)    Calcium 8.6 (*)    Total Protein 6.4 (*)    Albumin 3.2 (*)    AST 76 (*)    Total Bilirubin 1.9 (*)    GFR, Estimated 59 (*)    All other components within normal limits  PROTIME-INR - Abnormal; Notable for the following components:   Prothrombin Time 17.2 (*)    INR 1.4 (*)    All other components within normal limits  LACTIC ACID, PLASMA - Abnormal; Notable for the following components:    Lactic Acid, Venous 2.0 (*)    All other components within normal limits  CULTURE, BLOOD (ROUTINE X 2)  CULTURE, BLOOD (ROUTINE X 2)  LIPASE, BLOOD  URINALYSIS, ROUTINE W REFLEX MICROSCOPIC    EKG None  Radiology No results found.  Procedures Procedures    Medications Ordered in ED Medications  cefTRIAXone (ROCEPHIN) 2 g in sodium chloride 0.9 % 100 mL IVPB (2 g Intravenous New Bag/Given 07/04/22 0021)  fentaNYL (SUBLIMAZE) injection 100 mcg (100 mcg Intravenous Given 07/04/22 0016)    ED Course/ Medical Decision Making/ A&P                             Medical Decision Making Amount and/or Complexity of Data Reviewed Labs: ordered. Radiology: ordered.  Risk Prescription drug management.   Differential Diagnosis considered includes, but not limited to: SBP; SBO; hepatic encephalopathy; sepsis  Patient presents with symptoms of feeling slightly confused, generalized weakness.  She thought it might be hepatic encephalopathy, has been increasing her lactulose but has not improved.  She was found to be febrile at arrival.  Labs reassuring.  Ammonia is 40.  No significant leukocytosis.  LFTs at baseline.  Patient does have distended abdomen consistent with ascites.  There is diffuse tenderness concerning for spontaneous bacterial peritonitis.  She was covered with Rocephin.  Will admit for further management.         Final Clinical Impression(s) / ED Diagnoses Final diagnoses:  SBP (spontaneous bacterial peritonitis) (HCC)    Rx / DC Orders ED Discharge Orders     None         Ruby Dilone, Canary Brim, MD 07/04/22 [id number]

## 2022-07-04 NOTE — Procedures (Signed)
PROCEDURE SUMMARY:  Successful image-guided paracentesis from the right lower abdomen.  Yielded 1.2 liters of pale yellow fluid.  No immediate complications.  EBL = trace. Patient tolerated well.   Specimen was sent for labs.  Please see imaging section of Epic for full dictation.   Kennieth Francois PA-C 07/04/2022 10:46 AM

## 2022-07-04 NOTE — Discharge Summary (Signed)
Erica Banks, is a 42 y.o. female  DOB 12/20/1980  MRN [id number].  Admission date:  07/03/2022  Admitting Physician  Frankey Shown, DO  Discharge Date:  07/04/2022   Primary MD  Toma Deiters, MD  Recommendations for primary care physician for things to follow:  1)Please keep your appointment with the recent radiology group in [facility] to discuss possible TIPS procedure  2)Please keep your appointment with [facility] liver transplant team as previously scheduled for 07/11/2022  3)Please take medications including lactulose as prescribed  4)Repeat CBC and CMP blood test within a week  Admission Diagnosis  SBP (spontaneous bacterial peritonitis) (HCC) [K65.2] Abdominal pain [R10.9]   Discharge Diagnosis  SBP (spontaneous bacterial peritonitis) (HCC) [K65.2] Abdominal pain [R10.9]    Principal Problem:   Abdominal pain Active Problems:   Anemia of chronic disease   Prolonged QT interval   Hyponatremia   Hypoalbuminemia   Hyperammonemia (HCC)   Lactic acidosis   Thrombocytopenia (HCC)      Past Medical History:  Diagnosis Date   Anemia    Cirrhosis (HCC)    DVT (deep venous thrombosis) (HCC)    Low iron    Neuropathy    Neuropathy    Panic attacks    PONV (postoperative nausea and vomiting)    Pulmonary embolus (HCC) 2019    Past Surgical History:  Procedure Laterality Date   ABDOMINAL HYSTERECTOMY     BIOPSY N/A 06/24/2013   Procedure: BIOPSY TERMINAL ILEUM;  Surgeon: Corbin Ade, MD;  [facility];  Service: Endoscopy;  Laterality: N/A;   CHOLECYSTECTOMY     COLONOSCOPY N/A 06/24/2013   LKG:[id number] hemorrhoids/otherwise normal    COLONOSCOPY WITH PROPOFOL N/A 05/31/2021   Surgeon: Corbin Ade, MD; ery large nonbleeding internal and external hemorrhoids, portal colopathy.  Recommended repeat colonoscopy in 10 years.   ESOPHAGEAL BANDING  03/21/2022    Procedure: ESOPHAGEAL BANDING;  Surgeon: [name], MD;  Location: [facility] ENDOSCOPY;  Service: Gastroenterology;;   ESOPHAGEAL BANDING N/A 04/19/2022   Procedure: ESOPHAGEAL BANDING;  Surgeon: Corbin Ade, MD;  [facility];  Service: Endoscopy;  Laterality: N/A;   ESOPHAGOGASTRODUODENOSCOPY (EGD) WITH PROPOFOL N/A 03/29/2021   Surgeon: Lanelle Bal, DO;  grade 1 esophageal varices, portal hypertensive gastropathy with friable tissue with spontaneous ooze, normal duodenum.   ESOPHAGOGASTRODUODENOSCOPY (EGD) WITH PROPOFOL N/A 05/31/2021   Surgeon: Corbin Ade, MD;  grade 1-2 esophageal varices without bleeding stigmata, advanced appearing portal hypertensive gastropathy.   ESOPHAGOGASTRODUODENOSCOPY (EGD) WITH PROPOFOL N/A 12/22/2021   Procedure: ESOPHAGOGASTRODUODENOSCOPY (EGD) WITH PROPOFOL;  Surgeon: Dolores Frame, MD;  [facility];  Service: Gastroenterology;  Laterality: N/A;   ESOPHAGOGASTRODUODENOSCOPY (EGD) WITH PROPOFOL N/A 03/21/2022   Surgeon: [name], MD;  actively bleeding esophageal varices s/p banding x 5 with bleeding resolved, portal hypertensive gastropathy.   ESOPHAGOGASTRODUODENOSCOPY (EGD) WITH PROPOFOL N/A 04/19/2022   Procedure: ESOPHAGOGASTRODUODENOSCOPY (EGD) WITH PROPOFOL;  Surgeon: Corbin Ade, MD;  [facility];  Service: Endoscopy;  Laterality: N/A;  1:45 pm, pt knows to arrive at 9:45   IR PARACENTESIS  03/13/2022   IR PARACENTESIS  03/25/2022   nasal bone surgery     NASAL SINUS SURGERY     SINUS IRRIGATION     TONSILLECTOMY     TUBAL LIGATION       HPI  from the history and physical done on the day of admission:    HPI: Erica Banks is a 42 y.o. female with medical history significant of alcoholic cirrhosis with ascites who presents to the emergency department due to 1 week history of not feeling well.  She complained of having chills evening which usually resolve during the day,  however, in the last 2 days, the symptoms last throughout the day and was associated with increased nausea, decreased appetite, weakness, fatigue. Patient felt that she might be experiencing elevated ammonia, so she took elevated doses of lactulose which has resulted in diarrhea, but still felt unwell.  She also complained of distended abdomen with diffuse abdominal pain and thought that she was due for paracentesis, so she presented to the ED for further evaluation and management. She was admitted to [facility] from 2/21 to 2/27 due to acute upper GI bleed likely due to esophageal varices s/p banding x 5/portal hypertension/decompensated alcoholic cirrhosis. 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  Workup in the ED showed normocytic anemia, platelets of 109.  BMP showed sodium of 130, output is, bicarb 20, blood glucose 150, BUN 18, lipase 39, ammonia 40, lactic acid 2.0.  Blood culture pending. Chest x-ray no active disease Patient was treated with ceftriaxone, fentanyl was given.  Hospitalist was asked admit patient for further evaluation and management.   Review of Systems: Review of systems as noted in the HPI. All other systems reviewed and are negative.   Hospital Course:   1)Alcoholic liver cirrhosis with recurrent ascites -patient tells me she quit drinking over  2 years ago -abdominal pain has improved significantly  -Status post paracentesis on 07/04/2022, ascitic fluid is not consistent with SBP  -Patient did receive IV Rocephin, no need to continue  antibiotics at this time -Follow-up with interventional radiology to discuss possible TIPS procedure -Follow-up with [facility] liver transplant team for further evaluation on 07/11/2022 Continue Lasix and spironolactone    2) hypomagnesemia/hypokalemia--due to high-dose diuretic use -Replaced here -Okay to discharge home on potassium replacements   3)Hyperammonemia Ammonia level 49 this was 175 on 03/22/2022 -No significant altered mentation -No significant evidence of hepatic encephalopathy at this time Continue home lactulose and rifaximin   Chronic hyponatremia- - due to cirrhosis and poor p.o. intake  patient's sodium usually ranged within 124-130 -Sodium is currently at baseline   Hypoalbuminemia possibly due to alcoholic cirrhosis Albumin 3.2, patient received IV albumin during paracentesis   Chronic anemia and thrombocytopenia possibly due to alcoholic cirrhosis -no sign of bleeding Continue to monitor platelet levels with morning labs -Hemoglobin currently above 8, platelets are around 100 K  Discharge Condition: stable  Follow UP   Follow-up Information  Toma Deiters, MD. Schedule an appointment as soon as possible for a visit.   Specialty: Internal Medicine Contact information: [address] [place] [address] [phone]                  Consults obtained -paracentesis  Diet and Activity recommendation:  As advised  Discharge Instructions    Discharge Instructions     Call MD for:  difficulty breathing, headache or visual disturbances   Complete by: As directed    Call MD for:  persistant dizziness or light-headedness   Complete by: As directed    Call MD for:  persistant nausea and vomiting   Complete by: As directed    Call MD for:  temperature >100.4   Complete by: As directed    Diet - low sodium heart healthy   Complete by: As directed    Discharge instructions   Complete by: As directed    1)Please keep your appointment with  the recent radiology group in [facility] to discuss possible TIPS procedure  2)Please keep your appointment with [facility] liver transplant team as previously scheduled for 07/11/2022  3)Please take medications including lactulose as prescribed  4) repeat CBC and CMP blood test within a week   Increase activity slowly   Complete by: As directed        Discharge Medications     Allergies as of 07/04/2022       Reactions   Demerol Anaphylaxis   Per patient cardiac arrest        Medication List     STOP taking these medications    ciprofloxacin 500 MG tablet Commonly known as: CIPRO       TAKE these medications    carvedilol 3.125 MG tablet Commonly known as: Coreg Take 1 tablet (3.125 mg total) by mouth 2 (two) times daily with a meal.   CertaVite/Antioxidants Tabs Take 1 tablet by mouth daily.   furosemide 40 MG tablet Commonly known as: LASIX Take 1 tablet (40 mg total) by mouth 2 (two) times daily. What changed:  how much to take when to take this   gabapentin 600 MG tablet Commonly known as: NEURONTIN Take 600 mg by mouth in the morning.   lactulose 10 GM/15ML solution Commonly known as: CHRONULAC Take 30 mLs (20 g total) by mouth 3 (three) times daily.   metoCLOPramide 10 MG tablet Commonly known as: REGLAN Take 10 mg by mouth every 6 (six) hours as needed.   oxyCODONE 5 MG immediate release tablet Commonly known as: Roxicodone Take 1 tablet (5 mg total) by mouth every 6 (six) hours as needed for severe pain.   pantoprazole 40 MG tablet Commonly known as: PROTONIX Take 1 tablet (40 mg total) by mouth 2 (two) times daily before a meal.   potassium chloride 10 MEQ tablet Commonly known as: KLOR-CON Take 1 tablet (10 mEq total) by mouth daily. Take While taking Lasix/furosemide   promethazine 12.5 MG tablet Commonly known as: PHENERGAN Take 12.5 mg by mouth every 6 (six) hours as needed for nausea or vomiting.   rifaximin 550 MG Tabs  tablet Commonly known as: XIFAXAN Take 550 mg by mouth 2 (two) times daily.   spironolactone 100 MG tablet Commonly known as: Aldactone Take 1 tablet (100 mg total) by mouth 2 (two) times daily.        Major procedures and Radiology Reports - PLEASE review detailed and final reports for all details, in brief -  US Paracentesis  Result Date: 07/04/2022 INDICATION: Ascites, abdominal pain. Request received for diagnostic and therapeutic paracentesis EXAM: ULTRASOUND GUIDED DIAGNOSTIC AND THERAPEUTIC PARACENTESIS MEDICATIONS: 5cc 1% lidocaine COMPLICATIONS: None immediate. PROCEDURE: Informed written consent was obtained from the patient after a discussion of the risks, benefits and alternatives to treatment. A timeout was performed prior to the initiation of the procedure. Initial ultrasound scanning demonstrates a moderate amount of ascites within the right lower abdominal quadrant. The right lower abdomen was prepped and draped in the usual sterile fashion. 1% lidocaine was used for local anesthesia. Following this, a 19 gauge, 7-cm, Yueh catheter was introduced. An ultrasound image was saved for documentation purposes. The paracentesis was performed. The catheter was removed and a dressing was applied. The patient tolerated the procedure well without immediate post procedural complication. FINDINGS: A total of approximately 1.2 of pale yellow fluid was removed. Samples were sent to the laboratory as requested by the clinical team. IMPRESSION: Successful ultrasound-guided paracentesis yielding 1.2 liters of peritoneal fluid. PLAN: The patient has previously been formally evaluated by the [facility] Inter[facility] and is being actively followed for potential future intervention. Procedure performed by Mina Marble, PA-C Electronically Signed   By: Richarda Overlie M.D.   On: 07/04/2022 11:42   DG Chest Port 1 View  Result Date: 07/04/2022 CLINICAL DATA:  Fever and  sepsis EXAM: PORTABLE CHEST 1 VIEW COMPARISON:  Radiographs 03/22/2022 FINDINGS: Stable cardiomediastinal silhouette. No focal consolidation, pleural effusion, or pneumothorax. No displaced rib fractures. IMPRESSION: No active disease. Electronically Signed   By: Minerva Fester M.D.   On: 07/04/2022 02:39   US Paracentesis  Result Date: 06/17/2022 INDICATION: Patient with a history of cirrhosis with recurrent ascites. Interventional Radiology asked to perform a diagnostic and therapeutic paracentesis. EXAM: ULTRASOUND GUIDED PARACENTESIS MEDICATIONS: 1% lidocaine 10 ml. COMPLICATIONS: None immediate. PROCEDURE: Informed written consent was obtained from the patient after a discussion of the risks, benefits and alternatives to treatment. A timeout was performed prior to the initiation of the procedure. Initial ultrasound scanning demonstrates a large amount of ascites within the right lower abdominal quadrant. The right lower abdomen was prepped and draped in the usual sterile fashion. 1% lidocaine was used for local anesthesia. Following this, a 19 gauge, 7-cm, Yueh catheter was introduced. An ultrasound image was saved for documentation purposes. The paracentesis was performed. The catheter was removed and a dressing was applied. The patient tolerated the procedure well without immediate post procedural complication. FINDINGS: A total of approximately 3.6 L of clear yellow fluid was removed. IMPRESSION: Successful ultrasound-guided paracentesis yielding 3.6 liters of peritoneal fluid. Procedure performed by: Alwyn Ren, NP PLAN: The patient has previously been formally evaluated by the [facility] Interventional Radiology Portal Hypertension [facility] and is being actively followed for potential future intervention. Electronically Signed   By: Corlis Leak M.D.   On: 06/17/2022 14:12    Micro Results   Recent Results (from the past 240 hour(s))  Culture, blood (Routine X 2) w Reflex to ID Panel     Status:  None (Preliminary result)   Collection Time: 07/04/22 12:02 AM   Specimen: BLOOD  Result Value Ref Range Status   Specimen Description BLOOD LEFT ANTECUBITAL  Final   Special Requests   Final    BOTTLES DRAWN AEROBIC AND ANAEROBIC Blood Culture adequate volume   Culture   Final    NO GROWTH < 12 HOURS Performed at [facility], [address], [place], [place] [address]  Report Status PENDING  Incomplete  Culture, blood (Routine X 2) w Reflex to ID Panel     Status: None (Preliminary result)   Collection Time: 07/04/22 12:04 AM   Specimen: BLOOD  Result Value Ref Range Status   Specimen Description BLOOD BLOOD LEFT FOREARM  Final   Special Requests   Final    BOTTLES DRAWN AEROBIC AND ANAEROBIC Blood Culture adequate volume   Culture   Final    NO GROWTH < 12 HOURS Performed at [facility], [address] [place], [place] [address]    Report Status PENDING  Incomplete  Gram stain     Status: None   Collection Time: 07/04/22 10:54 AM   Specimen: Ascitic  Result Value Ref Range Status   Specimen Description ASCITIC  Final   Special Requests NONE  Final   Gram Stain   Final    CYTOSPIN SMEAR NO ORGANISMS SEEN WBC PRESENT, PREDOMINANTLY MONONUCLEAR Performed at [facility], [address], [place], [place] [address]    Report Status 07/04/2022 FINAL  Final    Today   Subjective    Enriqueta Cantave today has no new complaints No fever  Or chills  No nausea or vomiting Tolerated paracentesis well  Patient has been seen and examined prior to discharge   Objective   Blood pressure 124/79, pulse 73, temperature 98.2 F (36.8 C), temperature source Oral, resp. rate 16, height 5\' 6"  (1.676 m), weight 66.9 kg, SpO2 99 %.  No intake or output data in the 24 hours ending 07/04/22 1347  Exam Gen:- Awake Alert, no acute distress  HEENT:- [place].AT, No sclera icterus Neck-Supple Neck,No JVD,.  Lungs-  CTAB , good air movement bilaterally CV- S1, S2 normal, regular, 3/6  SM Abd-  +ve B.Sounds, Abd Soft, No tenderness, nontender umbilical hernia noted -No significant ascites after paracentesis Extremity/Skin:- No  edema,   good pulses Psych-affect is appropriate, oriented x3 Neuro-no new focal deficits, no tremors    Data Review   CBC w Diff:  Lab Results  Component Value Date   WBC 4.1 07/04/2022   HGB 8.3 (L) 07/04/2022   HCT 25.9 (L) 07/04/2022   PLT 98 (L) 07/04/2022   LYMPHOPCT 16 07/03/2022   BANDSPCT 1 02/13/2022   MONOPCT 12 07/03/2022   EOSPCT 2 07/03/2022   BASOPCT 1 07/03/2022    CMP:  Lab Results  Component Value Date   NA 129 (L) 07/04/2022   NA 139 06/04/2022   K 3.3 (L) 07/04/2022   CL 97 (L) 07/04/2022   CO2 22 07/04/2022   BUN 17 07/04/2022   BUN 7 06/04/2022   CREATININE 1.13 (H) 07/04/2022   CREATININE 0.78 02/05/2022   PROT 5.7 (L) 07/04/2022   ALBUMIN 2.8 (L) 07/04/2022   BILITOT 1.6 (H) 07/04/2022   ALKPHOS 109 07/04/2022   AST 68 (H) 07/04/2022   ALT 30 07/04/2022  .  Total Discharge time is about 33 minutes  Shon Hale M.D on 07/04/2022 at 1:47 PM  Go to [email or web address] -  for contact info  [facility] - Office  [phone]

## 2022-07-05 LAB — BLOOD CULTURE ID PANEL (REFLEXED) - BCID2

## 2022-07-05 LAB — CULTURE, BLOOD (ROUTINE X 2)

## 2022-07-05 NOTE — Progress Notes (Signed)
07/05/2022 1:13 PM  HOSPITALIST CROSS COVERAGE NOTE   Received a message from microbiology that 1/4 blood cultures positive for strep species.  Discussed with pharmacist and he reviewed with the ID pharmacist and the recommendation is NOT to treat as they feel this is likely a contaminant.    Rodney Langton, MD, DipACD How to contact the [facility] Attending or Consulting provider 7A - 7P or covering provider during after hours 7P -7A, for this patient?  Check the care team in [facility] and look for a) attending/consulting [facility] provider listed and b) the [facility] team listed Log into [email or web address] and use [place]'s universal password to access. If you do not have the password, please contact the hospital operator. Locate the [facility] provider you are looking for under [facility] and page to a number that you can be directly reached. If you still have difficulty reaching the provider, please page the [facility] (Director on Call) for the Hospitalists listed on amion for assistance.

## 2022-07-06 LAB — CULTURE, BLOOD (ROUTINE X 2)

## 2022-07-06 LAB — CULTURE, BODY FLUID W GRAM STAIN -BOTTLE

## 2022-07-08 LAB — CYTOLOGY - NON PAP

## 2022-07-08 LAB — PH, BODY FLUID: pH, Body Fluid: 7.4

## 2022-07-09 LAB — CULTURE, BODY FLUID W GRAM STAIN -BOTTLE: Culture: NO GROWTH

## 2022-07-09 LAB — CULTURE, BLOOD (ROUTINE X 2): Special Requests: ADEQUATE

## 2022-07-10 ENCOUNTER — Encounter: Payer: Self-pay | Admitting: Gastroenterology

## 2022-07-15 ENCOUNTER — Encounter: Payer: Self-pay | Admitting: *Deleted

## 2022-07-15 ENCOUNTER — Other Ambulatory Visit: Payer: Self-pay | Admitting: *Deleted

## 2022-07-15 ENCOUNTER — Telehealth: Payer: Self-pay | Admitting: *Deleted

## 2022-07-15 DIAGNOSIS — K7460 Unspecified cirrhosis of liver: Secondary | ICD-10-CM

## 2022-07-15 NOTE — Telephone Encounter (Signed)
Pt called and states she needs a standing order for paracentesis. She states she only has one more and she has been going weekly.

## 2022-07-15 NOTE — Telephone Encounter (Signed)
Please arrange standing paracentesis up to once weekly.  Max 5L  25g of 25% IV albumin at the start Labs- body fluid cell count, culture, and gram stain  Patient needs to hold diuretics the days she has paracentesis.

## 2022-07-15 NOTE — Telephone Encounter (Signed)
How many weeks?

## 2022-07-15 NOTE — Telephone Encounter (Signed)
6 weeks

## 2022-07-15 NOTE — Addendum Note (Signed)
Addended by: Elinor Dodge on: 07/15/2022 04:38 PM   Modules accepted: Orders

## 2022-07-15 NOTE — Telephone Encounter (Signed)
Orders placed in epic. Message sent to pt via mychart.

## 2022-07-16 ENCOUNTER — Other Ambulatory Visit (HOSPITAL_COMMUNITY): Payer: Self-pay | Admitting: Nurse Practitioner

## 2022-07-16 ENCOUNTER — Ambulatory Visit (HOSPITAL_COMMUNITY)
Admission: RE | Admit: 2022-07-16 | Discharge: 2022-07-16 | Disposition: A | Discharge status: Home / Self Care | Payer: Medicaid Other | Source: Ambulatory Visit | Attending: Nurse Practitioner | Admitting: Nurse Practitioner

## 2022-07-16 DIAGNOSIS — R188 Other ascites: Secondary | ICD-10-CM | POA: Insufficient documentation

## 2022-07-25 ENCOUNTER — Ambulatory Visit (HOSPITAL_COMMUNITY)
Admission: RE | Admit: 2022-07-25 | Discharge: 2022-07-25 | Disposition: A | Discharge status: Home / Self Care | Payer: Medicaid Other | Source: Ambulatory Visit | Attending: Gastroenterology | Admitting: Gastroenterology

## 2022-07-25 VITALS — BP 106/59 | HR 69 | Temp 97.8°F | Resp 16

## 2022-07-25 DIAGNOSIS — R188 Other ascites: Secondary | ICD-10-CM | POA: Insufficient documentation

## 2022-07-25 DIAGNOSIS — I8511 Secondary esophageal varices with bleeding: Secondary | ICD-10-CM | POA: Insufficient documentation

## 2022-07-25 DIAGNOSIS — K7460 Unspecified cirrhosis of liver: Secondary | ICD-10-CM | POA: Diagnosis present

## 2022-07-25 DIAGNOSIS — E43 Unspecified severe protein-calorie malnutrition: Secondary | ICD-10-CM | POA: Diagnosis present

## 2022-07-25 LAB — BODY FLUID CELL COUNT WITH DIFFERENTIAL
Eos, Fluid: 0 %
Lymphs, Fluid: 74 %
Monocyte-Macrophage-Serous Fluid: 24 % — ABNORMAL LOW (ref 50–90)
Neutrophil Count, Fluid: 2 % (ref 0–25)
Total Nucleated Cell Count, Fluid: 154 cu mm (ref 0–1000)

## 2022-07-25 LAB — GRAM STAIN: Gram Stain: NONE SEEN

## 2022-07-25 NOTE — Progress Notes (Signed)
Pt escorted to [place] 1 ambulatory from waiting area. Procedure explained, consent obtained. Prepped and draped in sterile manner. Local anesthetic admin without adverse reaction, access obtained at RLQ without difficulty. Clear dark amber serous fluid retrived, connected to vacutainer  suction. 3.5 L obtained, no obvious distress.

## 2022-07-25 NOTE — Procedures (Signed)
PROCEDURE SUMMARY:  Successful ultrasound guided diagnostic and therapeutic paracentesis from the RUQ. Yielded 3.5 L of hazy, amber fluid.  No immediate complications.  The patient tolerated the procedure well.   Specimen was sent for labs.  EBL <  1mL  The patient has previously been formally evaluated by the [facility] Interventional Radiology Portal [facility]pertension [facility] and is being actively followed for potential future intervention.    Alex Gardener, AGNP-BC 07/25/2022, 10:59 AM

## 2022-07-27 LAB — PATHOLOGIST SMEAR REVIEW

## 2022-07-27 LAB — CULTURE, BODY FLUID W GRAM STAIN -BOTTLE

## 2022-07-28 LAB — CULTURE, BODY FLUID W GRAM STAIN -BOTTLE

## 2022-07-29 ENCOUNTER — Other Ambulatory Visit: Payer: Self-pay

## 2022-07-29 ENCOUNTER — Ambulatory Visit (HOSPITAL_COMMUNITY): Payer: Medicaid Other | Attending: Transplant Hepatology | Admitting: Physical Therapy

## 2022-07-29 ENCOUNTER — Encounter (HOSPITAL_COMMUNITY)
Admission: RE | Admit: 2022-07-29 | Discharge: 2022-07-29 | Disposition: A | Discharge status: Home / Self Care | Payer: Medicaid Other | Source: Ambulatory Visit | Attending: Internal Medicine | Admitting: Internal Medicine

## 2022-07-29 ENCOUNTER — Encounter (HOSPITAL_COMMUNITY): Payer: Self-pay

## 2022-07-29 ENCOUNTER — Encounter (HOSPITAL_COMMUNITY): Payer: Self-pay | Admitting: Physical Therapy

## 2022-07-29 DIAGNOSIS — R2689 Other abnormalities of gait and mobility: Secondary | ICD-10-CM

## 2022-07-29 DIAGNOSIS — M6281 Muscle weakness (generalized): Secondary | ICD-10-CM

## 2022-07-29 NOTE — Therapy (Signed)
OUTPATIENT PHYSICAL THERAPY LOWER EXTREMITY EVALUATION   Patient Name: Erica Banks MRN: [id number] DOB:1980-09-14, 42 y.o., female Today's Date: 07/29/2022  END OF SESSION:  PT End of Session - 07/29/22 1337     Visit Number 1    Number of Visits 1    Authorization Type [employer] medicaid    PT Start Time 1303    PT Stop Time 1335    PT Time Calculation (min) 32 min    Activity Tolerance Patient tolerated treatment well    Behavior During Therapy WFL for tasks assessed/performed             Past Medical History:  Diagnosis Date   Anemia    Cirrhosis (HCC)    DVT (deep venous thrombosis) (HCC)    Low iron    Neuropathy    Neuropathy    Panic attacks    PONV (postoperative nausea and vomiting)    Pulmonary embolus (HCC) 2019   Past Surgical History:  Procedure Laterality Date   ABDOMINAL HYSTERECTOMY     BIOPSY N/A 06/24/2013   Procedure: BIOPSY TERMINAL ILEUM;  Surgeon: Corbin Ade, MD;  [facility];  Service: Endoscopy;  Laterality: N/A;   CHOLECYSTECTOMY     COLONOSCOPY N/A 06/24/2013   [id number]:[id number] hemorrhoids/otherwise normal    COLONOSCOPY WITH PROPOFOL N/A 05/31/2021   Surgeon: Corbin Ade, MD; ery large nonbleeding internal and external hemorrhoids, portal colopathy.  Recommended repeat colonoscopy in 10 years.   ESOPHAGEAL BANDING  03/21/2022   Procedure: ESOPHAGEAL BANDING;  Surgeon: [name], MD;  Location: [facility] ENDOSCOPY;  Service: Gastroenterology;;   ESOPHAGEAL BANDING N/A 04/19/2022   Procedure: ESOPHAGEAL BANDING;  Surgeon: Corbin Ade, MD;  Location: [facility];  Service: Endoscopy;  Laterality: N/A;   ESOPHAGOGASTRODUODENOSCOPY (EGD) WITH PROPOFOL N/A 03/29/2021   Surgeon: Lanelle Bal, DO;  grade 1 esophageal varices, portal hypertensive gastropathy with friable tissue with spontaneous ooze, normal duodenum.   ESOPHAGOGASTRODUODENOSCOPY (EGD) WITH PROPOFOL N/A 05/31/2021   Surgeon: Corbin Ade, MD;   grade 1-2 esophageal varices without bleeding stigmata, advanced appearing portal hypertensive gastropathy.   ESOPHAGOGASTRODUODENOSCOPY (EGD) WITH PROPOFOL N/A 12/22/2021   Procedure: ESOPHAGOGASTRODUODENOSCOPY (EGD) WITH PROPOFOL;  Surgeon: Dolores Frame, MD;  Location: [facility];  Service: Gastroenterology;  Laterality: N/A;   ESOPHAGOGASTRODUODENOSCOPY (EGD) WITH PROPOFOL N/A 03/21/2022   Surgeon: [name], MD;  actively bleeding esophageal varices s/p banding x 5 with bleeding resolved, portal hypertensive gastropathy.   ESOPHAGOGASTRODUODENOSCOPY (EGD) WITH PROPOFOL N/A 04/19/2022   Procedure: ESOPHAGOGASTRODUODENOSCOPY (EGD) WITH PROPOFOL;  Surgeon: Corbin Ade, MD;  Location[facility];  Service: Endoscopy;  Laterality: N/A;  1:45 pm, pt knows to arrive at 9:45   IR PARACENTESIS  03/13/2022   IR PARACENTESIS  03/25/2022   nasal bone surgery     NASAL SINUS SURGERY     SINUS IRRIGATION     TONSILLECTOMY     TUBAL LIGATION     Patient Active Problem List   Diagnosis Date Noted   Lactic acidosis 07/04/2022   Thrombocytopenia (HCC) 07/04/2022   History of spontaneous bacterial peritonitis 04/04/2022   Portal hypertension (HCC) 03/23/2022   Encephalopathy, hepatic (HCC) 03/23/2022   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 (HCC) 03/21/2022   Urinary tract infection due to ESBL Klebsiella 03/08/2022   Protein-calorie malnutrition, severe 03/08/2022   Decompensated hepatic cirrhosis (HCC) 03/04/2022   Acute respiratory failure with hypoxemia (HCC) 03/01/2022   Hepatic encephalopathy (HCC) 03/01/2022  Transaminitis 02/14/2022   Abdominal pain 02/14/2022   Acute hepatic encephalopathy (HCC) 02/10/2022   Hemorrhoids 02/10/2022   Generalized weakness 12/19/2021   AKI (acute kidney injury) (HCC) 12/19/2021   Hyperammonemia (HCC) 12/19/2021   Hypomagnesemia 12/19/2021   Ascites due to alcoholic cirrhosis (HCC) 12/18/2021    Hypocalcemia 11/23/2021   Hypoalbuminemia 11/23/2021   Tobacco use disorder 11/23/2021   Esophageal varices without bleeding (HCC) 10/31/2021   Heartburn 08/23/2021   Nausea without vomiting 08/23/2021   Lesion of liver less than 1 cm in diameter    Acute blood loss anemia 03/27/2021   Alcoholic cirrhosis of liver with ascites (HCC) 03/27/2021   Hyponatremia 03/27/2021   Iron deficiency anemia 03/27/2021   Folate deficiency 03/27/2021   Ascites    Cirrhosis of liver with ascites (HCC)    Intractable nausea and vomiting 07/20/2019   Cholelithiasis 07/20/2019   Prolonged QT interval 07/20/2019   Anemia 01/01/2015   Anemia of chronic disease 01/01/2015   Hypokalemia 01/01/2015   Acute gastroenteritis 01/01/2015   Abnormal CT of the abdomen 06/08/2013   Gastroesophageal reflux disease 06/08/2013    PCP: Toma Deiters, MD  REFERRING PROVIDER: Toma Deiters, MD  REFERRING DIAG:  Diagnosis  R26.9 (ICD-10-CM) - Unspecified abnormalities of gait and mobility    THERAPY DIAG:  Other abnormalities of gait and mobility  Muscle weakness (generalized)  Rationale for Evaluation and Treatment: Rehabilitation  ONSET DATE: 02/28/22  SUBJECTIVE:   SUBJECTIVE STATEMENT: Pt states that she has to do therapy for a liver transplant as she was in the hospital five months ago and could not walk at that time but she has been working on it since February and has been walking and biking.     PERTINENT HISTORY: Anemia, cirrhosis PAIN:  Are you having pain? No  PRECAUTIONS: Other: lifting :  not to lift more than a gallon of milk   WEIGHT BEARING RESTRICTIONS: No  FALLS:  Has patient fallen in last 6 months? No  LIVING ENVIRONMENT: Lives with: lives with their family Lives in: House/apartment Stairs: Yes: Internal: 10 steps; on right going up and External: 5 steps; on right going up Has following equipment at home: None  OCCUPATION: not working   PLOF: Independent  PATIENT  GOALS: to be as strong as I can  NEXT MD VISIT: goes to [facility] 08/16/22  OBJECTIVE:   COGNITION: Overall cognitive status: Within functional limits for tasks assessed     EDEMA: noted abdominal   POSTURE: No Significant postural limitations  LOWER EXTREMITY ROM: WFL  LOWER EXTREMITY MMT:  MMT Right eval Left eval  Hip flexion 5 5  Hip extension 4+ 4+  Hip abduction 5 5  Hip adduction    Hip internal rotation    Hip external rotation    Knee flexion  5  Knee extension 5 5  Ankle dorsiflexion 4+ 4+  Ankle plantarflexion 5 5  Ankle inversion    Ankle eversion     (Blank rows = not tested)    FUNCTIONAL TESTS:  30 seconds chair stand test: 16 reps which is poor for pt age and sex  2 minute walk test: 502 ft  Single leg stance:  SLS:  Rt 12", LT "(therapist stopped pt at 30.    TODAY'S TREATMENT:  DATE: 07/29/22: Eval Sit to stand x 16 Single leg stand :  Rt x 3 max 3"     PATIENT EDUCATION:  Education details: HEP increase walking to 20 minutes without stopping  Person educated: Patient Education method: Explanation, Demonstration, and Handouts Education comprehension: verbalized understanding and returned demonstration  HOME EXERCISE PROGRAM: Access Code: [email or web address] URL: [email or web address] Date: 07/29/2022 Prepared by: Virgina Organ  Exercises - Sit to Stand  - 2 x daily - 7 x weekly - 1 sets - 15 reps - Single Leg Stance  - 2 x daily - 7 x weekly - 1 sets - 3 reps - 30" hold  ASSESSMENT:  CLINICAL IMPRESSION: Patient is a 42 y.o. female who was seen today for physical therapy evaluation and treatment for abnormality of gait.   The pt states that this referral was from months ago and she has been working very successfully on her own with walking, biking and completing general housework duty.  At this time the pt has  minor strength deficits, minor balance deficits and minor decreased endurance.  Ms. Rodes will benefit from a HEP but does not need skilled treatment at this time.   OBJECTIVE IMPAIRMENTS: decreased activity tolerance and decreased balance.   ACTIVITY LIMITATIONS: locomotion level  PARTICIPATION LIMITATIONS: shopping, community activity, occupation, and yard work    [facility] POTENTIAL: Excellent  CLINICAL DECISION MAKING: Stable/uncomplicated  EVALUATION COMPLEXITY: Low   GOALS: Goals reviewed with patient? Yes  SHORT TERM GOALS: Target date: 07/29/22 I in HEP in order to improve balance and endurance Baseline: Goal status: INITIAL   PLAN:  PT FREQUENCY: 1x/week  PT DURATION: 1 week  PLANNED INTERVENTIONS: Therapeutic exercises and Balance training  PLAN FOR NEXT SESSION: one time treatment only   Virgina Organ, PT CLT [phone]  07/29/2022, 1:38 PM

## 2022-07-30 LAB — CULTURE, BODY FLUID W GRAM STAIN -BOTTLE: Culture: NO GROWTH

## 2022-07-31 ENCOUNTER — Ambulatory Visit (HOSPITAL_COMMUNITY): Payer: Medicaid Other | Admitting: Anesthesiology

## 2022-07-31 ENCOUNTER — Encounter (HOSPITAL_COMMUNITY)
Admission: RE | Disposition: A | Discharge status: Home / Self Care | Payer: Self-pay | Source: Home / Self Care | Attending: Internal Medicine

## 2022-07-31 ENCOUNTER — Ambulatory Visit (HOSPITAL_COMMUNITY)
Admission: RE | Admit: 2022-07-31 | Discharge: 2022-07-31 | Disposition: A | Discharge status: Home / Self Care | Payer: Medicaid Other | Attending: Internal Medicine | Admitting: Internal Medicine

## 2022-07-31 DIAGNOSIS — N179 Acute kidney failure, unspecified: Secondary | ICD-10-CM | POA: Diagnosis not present

## 2022-07-31 DIAGNOSIS — I8501 Esophageal varices with bleeding: Secondary | ICD-10-CM

## 2022-07-31 DIAGNOSIS — K219 Gastro-esophageal reflux disease without esophagitis: Secondary | ICD-10-CM | POA: Diagnosis not present

## 2022-07-31 DIAGNOSIS — Z79899 Other long term (current) drug therapy: Secondary | ICD-10-CM | POA: Diagnosis not present

## 2022-07-31 DIAGNOSIS — K7031 Alcoholic cirrhosis of liver with ascites: Secondary | ICD-10-CM | POA: Diagnosis not present

## 2022-07-31 DIAGNOSIS — I8500 Esophageal varices without bleeding: Secondary | ICD-10-CM | POA: Diagnosis not present

## 2022-07-31 DIAGNOSIS — F17210 Nicotine dependence, cigarettes, uncomplicated: Secondary | ICD-10-CM | POA: Diagnosis not present

## 2022-07-31 DIAGNOSIS — F1010 Alcohol abuse, uncomplicated: Secondary | ICD-10-CM

## 2022-07-31 DIAGNOSIS — K766 Portal hypertension: Secondary | ICD-10-CM | POA: Insufficient documentation

## 2022-07-31 HISTORY — PX: ESOPHAGOGASTRODUODENOSCOPY (EGD) WITH PROPOFOL: SHX5813

## 2022-07-31 SURGERY — ESOPHAGOGASTRODUODENOSCOPY (EGD) WITH PROPOFOL
Anesthesia: General

## 2022-07-31 MED ORDER — LIDOCAINE HCL (CARDIAC) PF 100 MG/5ML IV SOSY
PREFILLED_SYRINGE | INTRAVENOUS | Status: DC | PRN
  Administered 2022-07-31: 80 mg via INTRAVENOUS

## 2022-07-31 MED ORDER — LACTATED RINGERS IV SOLN: INTRAVENOUS | Status: DC | PRN

## 2022-07-31 MED ORDER — PROPOFOL 500 MG/50ML IV EMUL
INTRAVENOUS | Status: AC
  Filled 2022-07-31: qty 50

## 2022-07-31 MED ORDER — LIDOCAINE HCL (PF) 2 % IJ SOLN
INTRAMUSCULAR | Status: AC
  Filled 2022-07-31: qty 5

## 2022-07-31 MED ORDER — PHENYLEPHRINE 80 MCG/ML (10ML) SYRINGE FOR IV PUSH (FOR BLOOD PRESSURE SUPPORT)
PREFILLED_SYRINGE | INTRAVENOUS | Status: DC | PRN
  Administered 2022-07-31: 160 ug via INTRAVENOUS
  Administered 2022-07-31: 80 ug via INTRAVENOUS

## 2022-07-31 MED ORDER — PROPOFOL 500 MG/50ML IV EMUL
INTRAVENOUS | Status: DC | PRN
  Administered 2022-07-31: 175 ug/kg/min via INTRAVENOUS

## 2022-07-31 MED ORDER — PROPOFOL 10 MG/ML IV BOLUS
INTRAVENOUS | Status: DC | PRN
  Administered 2022-07-31: 120 mg via INTRAVENOUS

## 2022-07-31 MED ORDER — PHENYLEPHRINE 80 MCG/ML (10ML) SYRINGE FOR IV PUSH (FOR BLOOD PRESSURE SUPPORT)
PREFILLED_SYRINGE | INTRAVENOUS | Status: AC
  Filled 2022-07-31: qty 10

## 2022-07-31 NOTE — Anesthesia Preprocedure Evaluation (Addendum)
Anesthesia Evaluation  Patient identified by MRN, date of birth, ID band Patient awake    Reviewed: Allergy & Precautions, H&P , NPO status , Patient's Chart, lab work & pertinent test results  History of Anesthesia Complications (+) PONV and history of anesthetic complications  Airway Mallampati: II  TM Distance: >3 FB Neck ROM: Full    Dental  (+) Dental Advisory Given, Chipped, Missing   Pulmonary Current Smoker and Patient abstained from smoking.   Pulmonary exam normal breath sounds clear to auscultation       Cardiovascular negative cardio ROS Normal cardiovascular exam Rhythm:Regular Rate:Normal  20-Mar-2022 23:59:52 [facility]/ED ROUTINE RECORD Dec 24, 1980 (41 yr) Female Caucasian Vent. rate 86 BPM PR interval 111 ms QRS duration 79 ms QT/QTcB 435/521 ms P-R-T axes 18 16 41 Sinus rhythm Borderline short PR interval Consider left ventricular hypertrophy Prolonged QT interval Confirmed by Gilda Crease [phone]) on 03/21/2022 12:28:30 AM   Neuro/Psych  PSYCHIATRIC DISORDERS Anxiety     negative neurological ROS     GI/Hepatic ,GERD  Medicated and Controlled,,(+) Cirrhosis   Esophageal Varices and ascites  substance abuse  marijuana use  Endo/Other  negative endocrine ROS    Renal/GU Renal InsufficiencyRenal disease  negative genitourinary   Musculoskeletal negative musculoskeletal ROS (+)    Abdominal   Peds negative pediatric ROS (+)  Hematology  (+) Blood dyscrasia, anemia   Anesthesia Other Findings   Reproductive/Obstetrics negative OB ROS                             Anesthesia Physical Anesthesia Plan  ASA: 3  Anesthesia Plan: General   Post-op Pain Management: Minimal or no pain anticipated   Induction: Intravenous  PONV Risk Score and Plan: 1 and Propofol infusion  Airway Management Planned: Nasal Cannula and Natural  Airway  Additional Equipment:   Intra-op Plan:   Post-operative Plan:   Informed Consent: I have reviewed the patients History and Physical, chart, labs and discussed the procedure including the risks, benefits and alternatives for the proposed anesthesia with the patient or authorized representative who has indicated his/her understanding and acceptance.     Dental advisory given  Plan Discussed with: CRNA and Surgeon  Anesthesia Plan Comments:         Anesthesia Quick Evaluation

## 2022-07-31 NOTE — Op Note (Signed)
[facility] Patient Name: Erica Banks Procedure Date: 07/31/2022 7:09 AM MRN: [id number] Date of Birth: 09-08-1980 Attending MD: Gennette Pac , MD, [id number] CSN: [id number] Age: 42 Admit Type: Outpatient Procedure:                Upper GI endoscopy Indications:              Follow-up of esophageal varices Providers:                Gennette Pac, MD, Shelvia Page, Lennice Sites                            Technician, Technician Referring MD:              Medicines:                Propofol per Anesthesia Complications:            No immediate complications. Estimated Blood Loss:     Estimated blood loss: none. Procedure:                Pre-Anesthesia Assessment:                           - Prior to the procedure, a History and Physical                            was performed, and patient medications and                            allergies were reviewed. The patient's tolerance of                            previous anesthesia was also reviewed. The risks                            and benefits of the procedure and the sedation                            options and risks were discussed with the patient.                            All questions were answered, and informed consent                            was obtained. Prior Anticoagulants: The patient has                            taken no anticoagulant or antiplatelet agents. ASA                            Grade Assessment: III - A patient with severe                            systemic disease. After reviewing the risks and  benefits, the patient was deemed in satisfactory                            condition to undergo the procedure.                           After obtaining informed consent, the endoscope was                            passed under direct vision. Throughout the                            procedure, the patient's blood pressure, pulse, and                             oxygen saturations were monitored continuously. The                            [id number]-[id number] ([id number]) scope was introduced through the                            mouth, and advanced to the fundus of the stomach.                            The upper GI endoscopy was accomplished without                            difficulty. The patient tolerated the procedure                            well. The procedure was aborted due to presence of                            food. The procedure was aborted due to presence of                            food. Scope In: 7:40:23 AM Scope Out: 7:42:06 AM Total Procedure Duration: 0 hours 1 minute 43 seconds  Findings:      Distal esophageal varices relatively decompressed however she still had       persisting 4 columns grade 2-3 more mid esophagus. Single cherry red       spot as well. I advanced the scope into the stomach where I found a       large amount of food. I elected to abort the procedure at this point in       time with gastric findings. Impression:               - The procedure was aborted due to presence of food.                           -Persisting mid esophageal varices with 1 cherry                            red spot. No banding performed.  Moderate Sedation:      Moderate (conscious) sedation was personally administered by an       anesthesia professional. The following parameters were monitored: oxygen       saturation, heart rate, blood pressure, respiratory rate, EKG, adequacy       of pulmonary ventilation, and response to care. Recommendation:           - Patient has a contact number available for                            emergencies. The signs and symptoms of potential                            delayed complications were discussed with the                            patient. Return to normal activities tomorrow.                            Written discharge instructions were provided to the                            patient.                            - Advance diet as tolerated.                           - Continue present medications.                           - Repeat upper endoscopy (date not yet determined)                            for retreatment.                           - Return to GI office in 4 weeks. Procedure Code(s):        --- Professional ---                           43200, 52, Esophagoscopy, flexible, transoral;                            diagnostic, including collection of specimen(s) by                            brushing or washing, when performed (separate                            procedure) Diagnosis Code(s):        --- Professional ---                           I85.00, Esophageal varices without bleeding CPT copyright 2022 American Medical Association. All rights reserved. The codes documented in this report are preliminary and upon coder review may  be revised to meet current compliance requirements. Erica Banks. Erica Gellatly, MD Gennette Pac, MD 07/31/2022 7:52:42 AM This report has been signed electronically. Number of Addenda: 0

## 2022-07-31 NOTE — H&P (Signed)
@LOGO @   Primary Care Physician:  Toma Deiters, MD Primary Gastroenterologist:  Dr. Jena Gauss  Pre-Procedure History & Physical: HPI:  Erica Banks is a 42 y.o. female here for ` surveillance EGD.  Underwent esophageal varices band ligation earlier in the year.  She is here for surveillance per plan.  Patient tells me she is nearing listing for transplant.  I had chest pain difficulty swallowing for couple days after last banding session.  Past Medical History:  Diagnosis Date   Anemia    Cirrhosis (HCC)    DVT (deep venous thrombosis) (HCC)    Low iron    Neuropathy    Neuropathy    Panic attacks    PONV (postoperative nausea and vomiting)    Pulmonary embolus (HCC) 2019    Past Surgical History:  Procedure Laterality Date   ABDOMINAL HYSTERECTOMY     BIOPSY N/A 06/24/2013   Procedure: BIOPSY TERMINAL ILEUM;  Surgeon: Corbin Ade, MD;  [facility];  Service: Endoscopy;  Laterality: N/A;   CHOLECYSTECTOMY     COLONOSCOPY N/A 06/24/2013   [id number] hemorrhoids/otherwise normal    COLONOSCOPY WITH PROPOFOL N/A 05/31/2021   Surgeon: Corbin Ade, MD; ery large nonbleeding internal and external hemorrhoids, portal colopathy.  Recommended repeat colonoscopy in 10 years.   ESOPHAGEAL BANDING  03/21/2022   Procedure: ESOPHAGEAL BANDING;  Surgeon: [name], MD;  Location: [facility] ENDOSCOPY;  Service: Gastroenterology;;   ESOPHAGEAL BANDING N/A 04/19/2022   Procedure: ESOPHAGEAL BANDING;  Surgeon: Corbin Ade, MD;  [facility];  Service: Endoscopy;  Laterality: N/A;   ESOPHAGOGASTRODUODENOSCOPY (EGD) WITH PROPOFOL N/A 03/29/2021   Surgeon: Lanelle Bal, DO;  grade 1 esophageal varices, portal hypertensive gastropathy with friable tissue with spontaneous ooze, normal duodenum.   ESOPHAGOGASTRODUODENOSCOPY (EGD) WITH PROPOFOL N/A 05/31/2021   Surgeon: Corbin Ade, MD;  grade 1-2 esophageal varices without bleeding stigmata, advanced  appearing portal hypertensive gastropathy.   ESOPHAGOGASTRODUODENOSCOPY (EGD) WITH PROPOFOL N/A 12/22/2021   Procedure: ESOPHAGOGASTRODUODENOSCOPY (EGD) WITH PROPOFOL;  Surgeon: Dolores Frame, MD;  [facility];  Service: Gastroenterology;  Laterality: N/A;   ESOPHAGOGASTRODUODENOSCOPY (EGD) WITH PROPOFOL N/A 03/21/2022   Surgeon: [name], MD;  actively bleeding esophageal varices s/p banding x 5 with bleeding resolved, portal hypertensive gastropathy.   ESOPHAGOGASTRODUODENOSCOPY (EGD) WITH PROPOFOL N/A 04/19/2022   Procedure: ESOPHAGOGASTRODUODENOSCOPY (EGD) WITH PROPOFOL;  Surgeon: Corbin Ade, MD;  [facility]: [facility];  Service: Endoscopy;  Laterality: N/A;  1:45 pm, pt knows to arrive at 9:45   IR PARACENTESIS  03/13/2022   IR PARACENTESIS  03/25/2022   nasal bone surgery     NASAL SINUS SURGERY     SINUS IRRIGATION     TONSILLECTOMY     TUBAL LIGATION      Prior to Admission medications   Medication Sig Start Date End Date Taking? Authorizing Provider  carvedilol (COREG) 3.125 MG tablet Take 1 tablet (3.125 mg total) by mouth 2 (two) times daily with a meal. 05/16/22  Yes Ermalinda Memos S, PA-C  gabapentin (NEURONTIN) 600 MG tablet Take 600 mg by mouth in the morning. 04/23/22  Yes [provider]  magnesium 30 MG tablet Take 30 mg by mouth 2 (two) times daily.   Yes [provider]  metoCLOPramide (REGLAN) 10 MG tablet Take 10 mg by mouth every 6 (six) hours as needed. 12/04/21  Yes [provider]  Multiple Vitamin (MULTIVITAMIN WITH MINERALS) TABS tablet Take 1 tablet  by mouth daily. 03/18/22  Yes Lonia Blood, MD  oxyCODONE (ROXICODONE) 5 MG immediate release tablet Take 1 tablet (5 mg total) by mouth every 6 (six) hours as needed for severe pain. 05/30/22  Yes Bethann Berkshire, MD  pantoprazole (PROTONIX) 40 MG tablet Take 1 tablet (40 mg total) by mouth 2 (two) times daily before a meal. 03/18/22  Yes Lonia Blood, MD  potassium chloride (KLOR-CON) 10 MEQ tablet Take 1 tablet (10 mEq total) by mouth daily. Take While taking Lasix/furosemide 07/04/22  Yes Emokpae, Courage, MD  promethazine (PHENERGAN) 12.5 MG tablet Take 12.5 mg by mouth every 6 (six) hours as needed for nausea or vomiting.   Yes [provider]  rifaximin (XIFAXAN) 550 MG TABS tablet Take 550 mg by mouth 2 (two) times daily. 02/23/22 06/27/23 Yes [provider]  spironolactone (ALDACTONE) 100 MG tablet Take 1 tablet (100 mg total) by mouth 2 (two) times daily. 05/16/22  Yes Letta Median, PA-C  furosemide (LASIX) 40 MG tablet Take 1 tablet (40 mg total) by mouth 2 (two) times daily. Patient taking differently: Take 80 mg by mouth in the morning. 03/18/22   Lonia Blood, MD  lactulose (CHRONULAC) 10 GM/15ML solution Take 30 mLs (20 g total) by mouth 3 (three) times daily. 07/04/22 07/04/23  Shon Hale, MD    Allergies as of 06/27/2022 - Review Complete 06/17/2022  Allergen Reaction Noted   Demerol Anaphylaxis 10/12/2010    Family History  Problem Relation Age of Onset   Diabetes Mother    Hypertension Mother    COPD Mother    Cirrhosis Father        ETOH   Diabetes Sister    Hypertension Sister    Crohn's disease Maternal Grandmother    Cancer Other    Colon cancer Neg Hx    Autoimmune disease Neg Hx     Social History   Socioeconomic History   Marital status: Married    Spouse name: Not on file   Number of children: Not on file   Years of education: Not on file   Highest education level: Not on file  Occupational History   Occupation: Unemployed    Comment: wants to go to nursing school in fall 2015  Tobacco Use   Smoking status: Every Day    Packs/day: 0.50    Years: 12.00    Additional pack years: 0.00    Total pack years: 6.00    Types: Cigarettes   Smokeless tobacco: Never  Vaping Use   Vaping Use: Never used  Substance and Sexual Activity   Alcohol use: Not Currently     Alcohol/week: 4.0 - 5.0 standard drinks of alcohol    Types: 4 - 5 Cans of beer per week    Comment: None since February 2023.   Drug use: Not Currently    Types: Marijuana    Comment: Reports stopping in January 2024.   Sexual activity: Not Currently    Birth control/protection: Surgical  Other Topics Concern   Not on file  Social History Narrative   Not on file   Social Determinants of Health   Financial Resource Strain: Not on file  Food Insecurity: No Food Insecurity (03/08/2022)   Hunger Vital Sign    Worried About Running Out of Food in the Last Year: Never true    Ran Out of Food in the Last Year: Never true  Transportation Needs: No Transportation Needs (03/08/2022)   PRAPARE -  Administrator, Civil Service (Medical): No    Lack of Transportation (Non-Medical): No  Physical Activity: Not on file  Stress: Not on file  Social Connections: Not on file  Intimate Partner Violence: Not At Risk (03/08/2022)   Humiliation, Afraid, Rape, and Kick questionnaire    Fear of Current or Ex-Partner: No    Emotionally Abused: No    Physically Abused: No    Sexually Abused: No    Review of Systems: See HPI, otherwise negative ROS  Physical Exam: BP 119/69   Pulse 85   Temp 98.9 F (37.2 C) (Oral)   Resp 20   Ht 5\' 6"  (1.676 m)   Wt 66.9 kg   LMP  (LMP Unknown)   SpO2 100%   BMI 23.81 kg/m  General:   Alert,  Well-developed, well-nourished, pleasant and cooperative in NAD Neck:  Supple; no masses or thyromegaly. No significant cervical adenopathy. Lungs:  Clear throughout to auscultation.   No wheezes, crackles, or rhonchi. No acute distress. Heart:  Regular rate and rhythm; no murmurs, clicks, rubs,  or gallops. Abdomen: Non-distended, normal bowel sounds.  Soft and nontender without appreciable mass or hepatosplenomegaly.   Impression/Plan: 42 year old lady with end-stage EtOH cirrhosis being evaluated at [facility] for transplant here for surveillance EGD underwent  esophageal band ligation in February.  Here for follow-up.  Now on Coreg. The risks, benefits, limitations, alternatives and imponderables have been reviewed with the patient. Potential for esophageal dilation, biopsy, etc. have also been reviewed.  Questions have been answered. All parties agreeable.      Notice: This dictation was prepared with Dragon dictation along with smaller phrase technology. Any transcriptional errors that result from this process are unintentional and may not be corrected upon review.

## 2022-07-31 NOTE — Transfer of Care (Addendum)
Immediate Anesthesia Transfer of Care Note  Patient: Erica Banks  Procedure(s) Performed: ESOPHAGOGASTRODUODENOSCOPY (EGD) WITH PROPOFOL  Patient Location: PACU  Anesthesia Type:General  Level of Consciousness: awake and patient cooperative  Airway & Oxygen Therapy: Patient Spontanous Breathing  Post-op Assessment: Report given to RN and Post -op Vital signs reviewed and stable  Post vital signs: Reviewed and stable  Last Vitals:  Vitals Value Taken Time  BP 106/52 07/31/22   0752  Temp 36.9 07/03024  0752  Pulse 76 07/31/22   0752  Resp 17 07/31/22   0752  SpO2 100% 07/31/22    Last Pain:  Vitals:   07/31/22 0734  TempSrc:   PainSc: 0-No pain      Patients Stated Pain Goal: 6 (07/31/22 0651)  Complications: No notable events documented.

## 2022-07-31 NOTE — Discharge Instructions (Addendum)
EGD Discharge instructions Please read the instructions outlined below and refer to this sheet in the next few weeks. These discharge instructions provide you with general information on caring for yourself after you leave the hospital. Your doctor may also give you specific instructions. While your treatment has been planned according to the most current medical practices available, unavoidable complications occasionally occur. If you have any problems or questions after discharge, please call your doctor. ACTIVITY You may resume your regular activity but move at a slower pace for the next 24 hours.  Take frequent rest periods for the next 24 hours.  Walking will help expel (get rid of) the air and reduce the bloated feeling in your abdomen.  No driving for 24 hours (because of the anesthesia (medicine) used during the test).  You may shower.  Do not sign any important legal documents or operate any machinery for 24 hours (because of the anesthesia used during the test).  NUTRITION Drink plenty of fluids.  You may resume your normal diet.  Begin with a light meal and progress to your normal diet.  Avoid alcoholic beverages for 24 hours or as instructed by your caregiver.  MEDICATIONS You may resume your normal medications unless your caregiver tells you otherwise.  WHAT YOU CAN EXPECT TODAY You may experience abdominal discomfort such as a feeling of fullness or "gas" pains.  FOLLOW-UP Your doctor will discuss the results of your test with you.  SEEK IMMEDIATE MEDICAL ATTENTION IF ANY OF THE FOLLOWING OCCUR: Excessive nausea (feeling sick to your stomach) and/or vomiting.  Severe abdominal pain and distention (swelling).  Trouble swallowing.  Temperature over 101 F (37.8 C).  Rectal bleeding or vomiting of blood.    You do have persisting esophageal varices.  However, you had a large amount of food in your stomach which necessitated our aborting the procedure.  Banding could not be  done.  Office visit with Ermalinda Memos in 1 month  We will get you rescheduled in the near future.  At patient request, called Coralina Sackett at [phone] findings and recommendations

## 2022-08-01 NOTE — Anesthesia Postprocedure Evaluation (Signed)
Anesthesia Post Note  Patient: Laureen M Schaner  Procedure(s) Performed: ESOPHAGOGASTRODUODENOSCOPY (EGD) WITH PROPOFOL  Patient location during evaluation: Phase II Anesthesia Type: General Level of consciousness: awake Pain management: pain level controlled Vital Signs Assessment: post-procedure vital signs reviewed and stable Respiratory status: spontaneous breathing and respiratory function stable Cardiovascular status: blood pressure returned to baseline and stable Postop Assessment: no headache and no apparent nausea or vomiting Anesthetic complications: no Comments: Late entry   No notable events documented.   Last Vitals:  Vitals:   07/31/22 0651 07/31/22 0749  BP: 119/69 (!) 106/52  Pulse: 85 76  Resp: 20 17  Temp: 37.2 C 36.9 C  SpO2: 100% 100%    Last Pain:  Vitals:   07/31/22 0749  TempSrc: Oral  PainSc: 0-No pain                 Windell Norfolk

## 2022-08-07 ENCOUNTER — Encounter (HOSPITAL_COMMUNITY): Payer: Self-pay | Admitting: Internal Medicine

## 2022-08-29 NOTE — Progress Notes (Signed)
Referring Provider: Toma Deiters, MD Primary Care Physician:  Toma Deiters, MD Primary GI Physician: Dr. Jena Gauss  Chief Complaint  Patient presents with   Follow-up    Follow up. No problem     HPI:   Erica Banks is a 42 y.o. female with history of alcoholic cirrhosis complicated by recurrent ascites requiring frequent paracentesis, SBP, hepatic encephalopathy, esophageal varices with variceal bleed in February 2024 s/p banding.  She is following with [facility] for transplant consideration.  Also with history of IDA following with hematology receiving IV iron, GERD, PE, DVT, neuropathy. She is presenting today for follow-up.   Last seen in the office 05/16/2022.  Continue with recurrent ascites requiring frequent paracentesis and was in need of paracentesis at that time.  She was taking Lasix 80 mg in the morning and spironolactone 150 mg daily.  No HD on lactulose and Xifaxan.  No GI bleeding.  Denies any alcohol or illicit drug use.  She had started hepatitis B vaccination series with [facility].  I reviewed office visit with [facility] and noted they recommended transitioning from nadolol to carvedilol 3.125 mg twice daily with a goal of uptitrating to 6.25 mg twice daily.  Plan included paracentesis, EGD, refer to IR for TIPS evaluation, increase spironolactone to 100 mg twice daily, continue Lasix, but split dose to 40 mg twice daily, repeat BMP in 1 week, stop nadolol, start carvedilol 3.125 mg twice daily, bring log of heart rate and BP to office in 1 week, continue lactulose, Xifaxan, Bactrim.  Placed on recall for RUQ ultrasound July 2024  EGD 07/31/2022: Persistent mid esophageal varices with 1 cherry red spot, but no banding performed.  Procedure was aborted due to presence of food.  Recommended returning to GI office in 4 weeks.  She has still undergone frequent paracentesis since her last visit, but seems that she has been able to spread these out more recently.  Today:  Labs: 07/11/22:  INR 1.4, total bilirubin 1.9, albumin 3.3, alk phos 119, AST 74, creatinine 1.1, sodium 135, potassium 4.0.  MELD 3.0: 16 at 07/11/2022  Imaging: CT cirrhosis protocol 05/2022 with cirrhotic liver morphology. Mild heterogenous enhancement of the liver. No focal liver lesion.  AFP:   Elevated at 6.1 in January with recommendations to repeat in 3 months. AFP down to 4.7 on 04/30/22.   Hep A/B vaccination: Immune to Hep A. Needs Hep B Vaccination. [facility]ed [facility] at [facility].  EGD:  03/21/22 actively bleeding esophageal varices s/p banding x 5 with bleeding resolved, portal hypertensive gastropathy.  04/19/22: Grade 2-3 esophageal varices s/p banding, PHG with touch friability. Recommended surveillance EGD in 6-8 weeks.   07/31/2022: Persistent mid esophageal varices with 1 cherry red spot, but no banding performed.  Procedure was aborted due to presence of food.    Patient states that she ate dinner around 7 or 8 PM rather than stopping at 6 PM. BB: Currently taking carvedilol 3.125 mg twice daily and is tolerating this very well.  She is ready to try increasing the dose. Ascites/peripheral edema: No peripheral edema.  Reports abdominal swelling has improved. Still has swelling but it isn't causing any pain and does not feel that she needs paracentesis right now.  Paracentesis: Last on 07/25/22.  Diuretics: Currently taking Lasix 80 mg in the morning and spironolactone 100 mg twice daily. History of SBP: Yes. Currently on ciprofloxacin daily for prophylaxis. Encephalopathy:   Usually having 3 Bms a day.  Taking lactulose 3 times  a day and Xifaxan twice daily. GI bleeding: Last in Feb. 2024- variceal bleed.  She has not had any recurrent brbpr or melena.   Patient suspects that she will be listed for liver transplant soon.  States that she just needs to get her Pap smear done this month.   Not much nausea lately. Has phenergan and Reglan to use as need (prescribed by PCP).  Reflux is well-controlled.  ETOH:  None sine 02/2021.  Illicit drug use: None. Stopped marijuana around December 2023/January 2024.   Past Medical History:  Diagnosis Date   Anemia    Cirrhosis (HCC)    DVT (deep venous thrombosis) (HCC)    Low iron    Neuropathy    Neuropathy    Panic attacks    PONV (postoperative nausea and vomiting)    Pulmonary embolus (HCC) 2019    Past Surgical History:  Procedure Laterality Date   ABDOMINAL HYSTERECTOMY     BIOPSY N/A 06/24/2013   Procedure: BIOPSY TERMINAL ILEUM;  Surgeon: Corbin Ade, MD;  [facility];  Service: Endoscopy;  Laterality: N/A;   CHOLECYSTECTOMY     COLONOSCOPY N/A 06/24/2013   [id number] hemorrhoids/otherwise normal    COLONOSCOPY WITH PROPOFOL N/A 05/31/2021   Surgeon: Corbin Ade, MD; ery large nonbleeding internal and external hemorrhoids, portal colopathy.  Recommended repeat colonoscopy in 10 years.   ESOPHAGEAL BANDING  03/21/2022   Procedure: ESOPHAGEAL BANDING;  Surgeon: [name], MD;  Location: [facility] ENDOSCOPY;  Service: Gastroenterology;;   ESOPHAGEAL BANDING N/A 04/19/2022   Procedure: ESOPHAGEAL BANDING;  Surgeon: Corbin Ade, MD;  Location: [facility];  Service: Endoscopy;  Laterality: N/A;   ESOPHAGOGASTRODUODENOSCOPY (EGD) WITH PROPOFOL N/A 03/29/2021   Surgeon: Lanelle Bal, DO;  grade 1 esophageal varices, portal hypertensive gastropathy with friable tissue with spontaneous ooze, normal duodenum.   ESOPHAGOGASTRODUODENOSCOPY (EGD) WITH PROPOFOL N/A 05/31/2021   Surgeon: Corbin Ade, MD;  grade 1-2 esophageal varices without bleeding stigmata, advanced appearing portal hypertensive gastropathy.   ESOPHAGOGASTRODUODENOSCOPY (EGD) WITH PROPOFOL N/A 12/22/2021   Procedure: ESOPHAGOGASTRODUODENOSCOPY (EGD) WITH PROPOFOL;  Surgeon: Dolores Frame, MD;  [facility]: [facility];  Service: Gastroenterology;  Laterality: N/A;   ESOPHAGOGASTRODUODENOSCOPY (EGD) WITH PROPOFOL N/A 03/21/2022   Surgeon:  [name], MD;  actively bleeding esophageal varices s/p banding x 5 with bleeding resolved, portal hypertensive gastropathy.   ESOPHAGOGASTRODUODENOSCOPY (EGD) WITH PROPOFOL N/A 04/19/2022   Procedure: ESOPHAGOGASTRODUODENOSCOPY (EGD) WITH PROPOFOL;  Surgeon: Corbin Ade, MD;  [facility];  Service: Endoscopy;  Laterality: N/A;  1:45 pm, pt knows to arrive at 9:45   ESOPHAGOGASTRODUODENOSCOPY (EGD) WITH PROPOFOL N/A 07/31/2022   Procedure: ESOPHAGOGASTRODUODENOSCOPY (EGD) WITH PROPOFOL;  Surgeon: Corbin Ade, MD;  Location: [facility];  Service: Endoscopy;  Laterality: N/A;  7:30 am, asa 3   IR PARACENTESIS  03/13/2022   IR PARACENTESIS  03/25/2022   nasal bone surgery     NASAL SINUS SURGERY     SINUS IRRIGATION     TONSILLECTOMY     TUBAL LIGATION      Current Outpatient Medications  Medication Sig Dispense Refill   carvedilol (COREG) 6.25 MG tablet Take 1 tablet (6.25 mg total) by mouth 2 (two) times daily with a meal. 60 tablet 3   ciprofloxacin (CIPRO) 500 MG tablet Take 500 mg by mouth daily.     furosemide (LASIX) 40 MG tablet Take 1 tablet (40 mg total) by mouth 2 (two) times daily. (Patient  taking differently: Take 80 mg by mouth in the morning.) 60 tablet 2   gabapentin (NEURONTIN) 600 MG tablet Take 600 mg by mouth in the morning.     lactulose (CHRONULAC) 10 GM/15ML solution Take 30 mLs (20 g total) by mouth 3 (three) times daily. 473 mL 3   magnesium 30 MG tablet Take 30 mg by mouth 2 (two) times daily.     metoCLOPramide (REGLAN) 10 MG tablet Take 10 mg by mouth every 6 (six) hours as needed.     Multiple Vitamin (MULTIVITAMIN WITH MINERALS) TABS tablet Take 1 tablet by mouth daily. 30 tablet 2   oxyCODONE (ROXICODONE) 5 MG immediate release tablet Take 1 tablet (5 mg total) by mouth every 6 (six) hours as needed for severe pain. 10 tablet 0   pantoprazole (PROTONIX) 40 MG tablet Take 1 tablet (40 mg total) by mouth 2 (two) times daily before a  meal. 60 tablet 2   potassium chloride (KLOR-CON) 10 MEQ tablet Take 1 tablet (10 mEq total) by mouth daily. Take While taking Lasix/furosemide 30 tablet 2   promethazine (PHENERGAN) 12.5 MG tablet Take 12.5 mg by mouth every 6 (six) hours as needed for nausea or vomiting.     rifaximin (XIFAXAN) 550 MG TABS tablet Take 550 mg by mouth 2 (two) times daily.     spironolactone (ALDACTONE) 100 MG tablet Take 1 tablet (100 mg total) by mouth 2 (two) times daily. 60 tablet 3   No current facility-administered medications for this visit.    Allergies as of 08/30/2022 - Review Complete 08/30/2022  Allergen Reaction Noted   Demerol Anaphylaxis 10/12/2010    Family History  Problem Relation Age of Onset   Diabetes Mother    Hypertension Mother    COPD Mother    Cirrhosis Father        ETOH   Diabetes Sister    Hypertension Sister    Crohn's disease Maternal Grandmother    Cancer Other    Colon cancer Neg Hx    Autoimmune disease Neg Hx     Social History   Socioeconomic History   Marital status: Married    Spouse name: Not on file   Number of children: Not on file   Years of education: Not on file   Highest education level: Not on file  Occupational History   Occupation: Unemployed    Comment: wants to go to nursing school in fall 2015  Tobacco Use   Smoking status: Every Day    Current packs/day: 0.50    Average packs/day: 0.5 packs/day for 12.0 years (6.0 ttl pk-yrs)    Types: Cigarettes   Smokeless tobacco: Never  Vaping Use   Vaping status: Never Used  Substance and Sexual Activity   Alcohol use: Not Currently    Alcohol/week: 4.0 - 5.0 standard drinks of alcohol    Types: 4 - 5 Cans of beer per week    Comment: None since February 2023.   Drug use: Not Currently    Types: Marijuana    Comment: Reports stopping in January 2024.   Sexual activity: Not Currently    Birth control/protection: Surgical  Other Topics Concern   Not on file  Social History Narrative    Not on file   Social Determinants of Health   Financial Resource Strain: Low Risk  (08/07/2020)   Received [facility], [facility]   Overall Financial Resource Strain (CARDIA)    Difficulty of Paying Living Expenses:  Not hard at all  Food Insecurity: No Food Insecurity (03/08/2022)   Hunger Vital Sign    Worried About Running Out of Food in the Last Year: Never true    Ran Out of Food in the Last Year: Never true  Transportation Needs: No Transportation Needs (03/08/2022)   PRAPARE - Administrator, Civil Service (Medical): No    Lack of Transportation (Non-Medical): No  Physical Activity: Inactive (10/08/2017)   Received from [facility], [facility]   Exercise Vital Sign    Days of Exercise per Week: 0 days    Minutes of Exercise per Session: 0 min  Stress: Not on file  Social Connections: Not on file    Review of Systems: Gen: Denies fever, chills, cold or flulike symptoms, presyncope, syncope. CV: Denies chest pain, palpitations. Resp: Denies dyspnea, cough. GI: See HPI Heme: See HPI  Physical Exam: BP 137/63 (BP Location: Right Arm, Patient Position: Sitting, Cuff Size: Normal)   Pulse 83   Temp 97.9 F (36.6 C) (Temporal)   Ht 5\' 6"  (1.676 m)   Wt 153 lb 12.8 oz (69.8 kg)   LMP  (LMP Unknown)   SpO2 100%   BMI 24.82 kg/m  General:   Alert and oriented. No distress noted. Pleasant and cooperative.  Head:  Normocephalic and atraumatic. Eyes:  Conjuctiva clear without scleral icterus. Heart:  S1, S2 present without murmurs appreciated. Lungs:  Clear to auscultation bilaterally. No wheezes, rales, or rhonchi. No distress.  Abdomen:  +BS, distended with ascites but soft and non tender. +HSM. Soft reducible ventral hernia in the epigastric area and soft reducible umbilical hernia.  Msk:  Symmetrical without gross deformities. Normal posture. Extremities:  Without edema. Neurologic:  Alert and  oriented x4 Psych:  Normal mood and  affect.    Assessment:  42 year old female with history of alcoholic cirrhosis, ascites, SBP, hepatic encephalopathy, esophageal varices with variceal bleed in February 2024, following with [facility] for transplant consideration.  Also with history of IDA following with hematology receiving IV iron, GERD, PE, DVT, neuropathy.  She is presenting today for follow-up of cirrhosis and to discuss rescheduling EGD.  EtOH Cirrhosis: Decompensated.Following with [facility] for transplant evaluation. MELD 3.0, 16 in June 2024. Abstinent from alcohol since February 2023. Stopped marijuana around Dec. 2023. Cirrhosis has been complicated by recurrent ascites requiring frequent paracentesis, recurrent hepatic encephalopathy requiring hospitalization, SBP, esophageal varices with bleeding in Feb. 2024, s/p banding, now on NSBB. AFP elevated at 6.1 in January 2024, but  normalized in April 2024. Most recent imaging with CT cirrhosis protocol 05/2022 with cirrhotic liver morphology. Mild heterogenous enhancement of the liver. No focal liver lesion.   Ascites:  Recurrent ascites, previously requiring frequent paracentesis every 1-2 weeks though frequency has decreased since increasing diuretics. Currently on Lasix 80 mg in the morning and spironolactone 100 mg twice daily.  Renal function and electrolytes are remaining stable.  We discussed tips evaluation.  Patient states that [facility] IR did contact her, but since she was following with [facility], they stated they preferred not to perform TIPS on her.  At this point, patient suspects she is going to be listed for liver transplant very soon and prefers to hold off on possible TIPS.  Advised that she can discuss this with [facility] if she would like.  Esophageal varices: Needs repeat EGD for variceal banding. Previously with variceal bleed in February 2024 with banding x 5. EGD March 2024 with grade 2-3 esophageal  varices s/p banding, portal hypertensive gastropathy with touch  friability.  Attempted repeat EGD in July 2024, but patient had food in her stomach, so procedure was aborted though she was noted to have persistent mid esophageal varices with 1 cherry red spot.  She needs repeat EGD for banding which we will arrange in the near future.  For additional bleeding prophylaxis, she is on carvedilol 3.125 mg twice daily which we are increasing to 6.25 mg twice daily today.  She was advised to monitor blood pressure and heart rate daily and to contact me if her blood pressures are dropping below 90/60, heart rate below 50, or she begins to feel poorly.  Hepatic Encephalopathy:  No encephalopathy at this time.  Compliant with lactulose 3 times daily and Xifaxan twice daily.  She is having 3 bowel movements daily.   History of SBP:  Currently on ciprofloxacin daily for prophylaxis.  GERD:  Doing well on pantoprazole BID.    Plan:  Proceed with upper endoscopy with propofol by Dr. Jena Gauss in near future. The risks, benefits, and alternatives have been discussed with the patient in detail. The patient states understanding and desires to proceed.  ASA 3 Clinical diet 24 hours prior Reglan 10 mg twice daily day prior Increase carvedilol to 6.25 mg twice daily.  Patient will keep daily record of heart rate and blood pressure and let me know if her blood pressure drops below 90/60, heart rate drops below 50, or if she begins to feel poorly. Continue receiving paracentesis as needed. Continue Lasix 80 mg daily and spironolactone 100 mg twice daily. Strict 2 g/day sodium diet restriction. Continue lactulose with goal of 3 bowel movements daily. Continue Xifaxan twice daily. Continue ciprofloxacin daily. Continue pantoprazole twice daily. Continue following with [facility] transplant. Follow-up in our office in 6 months or sooner if needed.   Ermalinda Memos, PA-C [facility] Gastroenterology 08/30/2022

## 2022-08-30 ENCOUNTER — Encounter: Payer: Self-pay | Admitting: Gastroenterology

## 2022-08-30 ENCOUNTER — Ambulatory Visit (INDEPENDENT_AMBULATORY_CARE_PROVIDER_SITE_OTHER): Payer: Medicaid Other | Admitting: Gastroenterology

## 2022-08-30 VITALS — BP 137/63 | HR 83 | Temp 97.9°F | Ht 66.0 in | Wt 153.8 lb

## 2022-08-30 DIAGNOSIS — K219 Gastro-esophageal reflux disease without esophagitis: Secondary | ICD-10-CM | POA: Diagnosis not present

## 2022-08-30 DIAGNOSIS — I8501 Esophageal varices with bleeding: Secondary | ICD-10-CM

## 2022-08-30 DIAGNOSIS — K7031 Alcoholic cirrhosis of liver with ascites: Secondary | ICD-10-CM

## 2022-08-30 MED ORDER — CARVEDILOL 6.25 MG PO TABS
6.2500 mg | ORAL_TABLET | Freq: Two times a day (BID) | ORAL | 3 refills | Status: DC
Start: 2022-08-30 — End: 2023-04-30

## 2022-08-30 NOTE — Patient Instructions (Addendum)
We will get you scheduled for an upper endoscopy in the near future with Dr. Jena Gauss for esophageal variceal banding. We will have you follow a clear liquid diet for 24 hours before your procedure. I would also like for you to take Reglan in the morning and in the evening the day before your procedure.  Increase carvedilol to 6.25 mg twice daily.  You can finish out your current prescription of 3.125 mg, but take 2 pills in the morning and 2 pills in the evening to equal 6.25 mg twice a day.  When she pick up your new prescription, you will only need to take 1 pill in the morning and 1 pill in the evening. Monitor your blood pressure and heart rate daily with this new dose of carvedilol.  Let me know if your blood pressure drops below 90/60 or your heart rate drops below 50, or if you start to feel bad.   You may continue to have a paracentesis as needed.  Continue your current dose of Lasix and spironolactone.  Follow a strict 2 g/day sodium diet restriction.  Continue your current dose of lactulose with a goal of 3 bowel movements per day.  Continue Xifaxan twice daily.  Continue ciprofloxacin daily.  Continue following closely with [facility] transplant.  We will plan to see back in 6 months or sooner if needed.  It was great to see you again today!  I am glad you are feeling well overall!  Ermalinda Memos, PA-C [facility]

## 2022-09-10 ENCOUNTER — Encounter: Payer: Self-pay | Admitting: *Deleted

## 2022-10-07 NOTE — Patient Instructions (Signed)
20    Your procedure is scheduled on: 10/09/2022  Report to [facility] Main Entrance at   10:00  AM.  Call this number if you have problems the morning of surgery: [phone]   Remember:   Follow instructions on letter from office regarding when to stop eating and drinking        No Smoking the day of procedure      Take these medicines the morning of surgery with A SIP OF WATER: Carvedilol, Gabapentin, and pantoprazole  Oxycodone, Reglan, and/or Phenergan if needed              Do not wear jewelry, make-up or nail polish.  Do not wear lotions, powders, or perfumes. You may wear deodorant.                Do not bring valuables to the hospital.  Contacts, dentures or bridgework may not be worn into surgery.  Leave suitcase in the car. After surgery it may be brought to your room.  For patients admitted to the hospital, checkout time is 11:00 AM the day of discharge.   Patients discharged the day of surgery will not be allowed to drive home. Upper Endoscopy, Adult Upper endoscopy is a procedure to look inside the upper GI (gastrointestinal) tract. The upper GI tract is made up of: The part of the body that moves food from your mouth to your stomach (esophagus). The stomach. The first part of your small intestine (duodenum). This procedure is also called esophagogastroduodenoscopy (EGD) or gastroscopy. In this procedure, your health care provider passes a thin, flexible tube (endoscope) through your mouth and down your esophagus into your stomach. A small camera is attached to the end of the tube. Images from the camera appear on a monitor in the exam room. During this procedure, your health care provider may also remove a small piece of tissue to be sent to a lab and examined under a microscope (biopsy). Your health care provider may do an upper endoscopy to diagnose cancers of the upper GI tract. You may also have this procedure to find the cause of other conditions, such as: Stomach  pain. Heartburn. Pain or problems when swallowing. Nausea and vomiting. Stomach bleeding. Stomach ulcers. Tell a health care provider about: Any allergies you have. All medicines you are taking, including vitamins, herbs, eye drops, creams, and over-the-counter medicines. Any problems you or family members have had with anesthetic medicines. Any blood disorders you have. Any surgeries you have had. Any medical conditions you have. Whether you are pregnant or may be pregnant. What are the risks? Generally, this is a safe procedure. However, problems may occur, including: Infection. Bleeding. Allergic reactions to medicines. A tear or hole (perforation) in the esophagus, stomach, or duodenum. What happens before the procedure? Staying hydrated Follow instructions from your health care provider about hydration, which may include: Up to 4 hours before the procedure - you may continue to drink clear liquids, such as water, clear fruit juice, black coffee, and plain tea.   Medicines Ask your health care provider about: Changing or stopping your regular medicines. This is especially important if you are taking diabetes medicines or blood thinners. Taking medicines such as aspirin and ibuprofen. These medicines can thin your blood. Do not take these medicines unless your health care provider tells you to take them. Taking over-the-counter medicines, vitamins, herbs, and supplements. General instructions Plan to have someone take you home from the hospital or clinic. If you  will be going home right after the procedure, plan to have someone with you for 24 hours. Ask your health care provider what steps will be taken to help prevent infection. What happens during the procedure?  An IV will be inserted into one of your veins. You may be given one or more of the following: A medicine to help you relax (sedative). A medicine to numb the throat (local anesthetic). You will lie on your left  side on an exam table. Your health care provider will pass the endoscope through your mouth and down your esophagus. Your health care provider will use the scope to check the inside of your esophagus, stomach, and duodenum. Biopsies may be taken. The endoscope will be removed. The procedure may vary among health care providers and hospitals. What happens after the procedure? Your blood pressure, heart rate, breathing rate, and blood oxygen level will be monitored until you leave the hospital or clinic. Do not drive for 24 hours if you were given a sedative during your procedure. When your throat is no longer numb, you may be given some fluids to drink. It is up to you to get the results of your procedure. Ask your health care provider, or the department that is doing the procedure, when your results will be ready. Summary Upper endoscopy is a procedure to look inside the upper GI tract. During the procedure, an IV will be inserted into one of your veins. You may be given a medicine to help you relax. A medicine will be used to numb your throat. The endoscope will be passed through your mouth and down your esophagus. This information is not intended to replace advice given to you by your health care provider. Make sure you discuss any questions you have with your health care provider. Document Revised: 07/09/2017 Document Reviewed: 06/16/2017 Elsevier Patient Education  2020 [employer] Inc[employer]                                                                                                                                      EndoscopyCare After  Please read the instructions outlined below and refer to this sheet in the next few weeks. These discharge instructions provide you with general information on caring for yourself after you leave the hospital. Your doctor may also give you specific instructions. While your treatment has been planned according to the most current medical practices available,  unavoidable complications occasionally occur. If you have any problems or questions after discharge, please call your doctor. HOME CARE INSTRUCTIONS Activity You may resume your regular activity but move at a slower pace for the next 24 hours.  Take frequent rest periods for the next 24 hours.  Walking will help expel (get rid of) the air and reduce the bloated feeling in your abdomen.  No driving for 24 hours (because of the anesthesia (medicine) used during the test).  You may shower.  Do not sign any important legal documents or operate any machinery for 24 hours (because of the anesthesia used during the test).  Nutrition Drink plenty of fluids.  You may resume your normal diet.  Begin with a light meal and progress to your normal diet.  Avoid alcoholic beverages for 24 hours or as instructed by your caregiver.  Medications You may resume your normal medications unless your caregiver tells you otherwise. What you can expect today You may experience abdominal discomfort such as a feeling of fullness or "gas" pains.  You may experience a sore throat for 2 to 3 days. This is normal. Gargling with salt water may help this.  Follow-up Your doctor will discuss the results of your test with you. SEEK IMMEDIATE MEDICAL CARE IF: You have excessive nausea (feeling sick to your stomach) and/or vomiting.  You have severe abdominal pain and distention (swelling).  You have trouble swallowing.  You have a temperature over 100 F (37.8 C).  You have rectal bleeding or vomiting of blood.  Document Released: 08/29/2003 Document Revised: 01/03/2011 Document Reviewed: 03/11/2007 Esophageal Varices  Esophageal varices are enlarged veins in the part of the body that moves food from the mouth to the stomach (esophagus). They develop when extra blood is forced to flow through these veins because the blood's normal flow is blocked. Without treatment, esophageal varices eventually break and bleed  (hemorrhage), which can be life-threatening. What are the causes? This condition may be caused by: Scarring of the liver due to alcoholism. This is the most common cause. Long-term liver disease. Severe heart failure. A blood clot in a vein that supplies the liver. A disease that causes inflammation in the organs and other body areas. What are the signs or symptoms? Esophageal varices usually do not cause symptoms unless they start to bleed. Symptoms of bleeding esophageal varices include: Vomiting material that is bright red or that is black and looks like coffee grounds. Coughing up blood. Stools (feces) that look black and tarry. Dizziness or light-headedness. Low blood pressure. Loss of consciousness. How is this diagnosed? This condition is diagnosed with a procedure called endoscopy. During endoscopy, your health care provider uses a flexible tube with a small camera on the end of it (endoscope) to look down your throat and examine your esophagus. You may also have other tests, including: Imaging tests, such as a CT scan or ultrasound. Blood tests. How is this treated? This condition may be treated with: Medicines. Medicines are usually used to treat varices that are not bleeding. Procedures. Procedures are done to treat varices that are bleeding. They stop bleeding, or reduce pressure and the risk of bleeding. Procedures include: Placing an elastic band around the varices to keep them from bleeding. Replacing blood that you have lost due to bleeding. This may include getting a transfusion of blood or parts of blood, such as platelets or clotting factors. You may be given antibiotic medicine to help prevent infection. Getting an injection into the varices that causes it to shrink and close (sclerotherapy). You may also be given medicines that tighten blood vessels or change blood flow. Placing a balloon in the esophagus and inflating it. The balloon applies pressure to the bleeding  veins to help stop the bleeding. Placing a small tube within the veins in the liver. This decreases blood flow and pressure in the esophageal varices. If other treatments do not work, you may need a liver transplant. Follow these instructions at home: Medicines Take over-the-counter and prescription  medicines only as told by your health care provider. If you were prescribed an antibiotic medicine, take it as told by your health care provider. Do not stop taking the antibiotic even if you start to feel better. Do not take any NSAIDs (such as aspirin or ibuprofen) before first getting approval from your health care provider. General instructions  Do not drink alcohol. Return to your normal activities as told by your health care provider. Ask your health care provider what activities are safe for you. Avoid vigorous physical activity. Ask your health care provider what exercises are safe for you. Keep all follow-up visits. Contact a health care provider if: You have pain in the abdomen. You are unable to eat or drink. Get help right away if: You vomit blood or have blood in your stool. You have stools that look black or tarry. You have chest pain. You feel dizzy or have low blood pressure. You lose consciousness. These symptoms may represent a serious problem that is an emergency. Do not wait to see if the symptoms will go away. Get medical help right away. Call your local emergency services (911 in the [place] Do not drive yourself to the hospital. Summary Esophageal varices are enlarged veins in the esophagus, the part of your body that moves food from your mouth to your stomach. Without treatment, esophageal varices eventually break and bleed, which can be life-threatening. Esophageal varices usually do not cause symptoms unless they start to bleed. Keep all follow-up visits. This is important. This information is not intended to replace advice given to you by your health care provider.  Make sure you discuss any questions you have with your health care provider. Document Revised: 05/04/2019 Document Reviewed: 05/04/2019 Elsevier Patient Education  2024 [employer].

## 2022-10-08 ENCOUNTER — Encounter (HOSPITAL_COMMUNITY): Payer: Self-pay

## 2022-10-08 ENCOUNTER — Encounter (HOSPITAL_COMMUNITY)
Admission: RE | Admit: 2022-10-08 | Discharge: 2022-10-08 | Disposition: A | Discharge status: Home / Self Care | Payer: Medicaid Other | Source: Ambulatory Visit | Attending: Internal Medicine | Admitting: Internal Medicine

## 2022-10-08 VITALS — BP 107/57 | HR 65 | Temp 97.7°F | Resp 18 | Ht 66.0 in | Wt 153.9 lb

## 2022-10-08 DIAGNOSIS — Z01812 Encounter for preprocedural laboratory examination: Secondary | ICD-10-CM | POA: Diagnosis present

## 2022-10-08 DIAGNOSIS — K7460 Unspecified cirrhosis of liver: Secondary | ICD-10-CM | POA: Insufficient documentation

## 2022-10-08 DIAGNOSIS — R188 Other ascites: Secondary | ICD-10-CM | POA: Diagnosis not present

## 2022-10-08 LAB — COMPREHENSIVE METABOLIC PANEL
ALT: 34 U/L (ref 0–44)
AST: 69 U/L — ABNORMAL HIGH (ref 15–41)
Albumin: 3.3 g/dL — ABNORMAL LOW (ref 3.5–5.0)
Alkaline Phosphatase: 96 U/L (ref 38–126)
Anion gap: 7 (ref 5–15)
BUN: 26 mg/dL — ABNORMAL HIGH (ref 6–20)
CO2: 21 mmol/L — ABNORMAL LOW (ref 22–32)
Calcium: 8.4 mg/dL — ABNORMAL LOW (ref 8.9–10.3)
Chloride: 102 mmol/L (ref 98–111)
Creatinine, Ser: 2.03 mg/dL — ABNORMAL HIGH (ref 0.44–1.00)
GFR, Estimated: 31 mL/min — ABNORMAL LOW (ref >60)
Glucose, Bld: 101 mg/dL — ABNORMAL HIGH (ref 70–99)
Potassium: 4.8 mmol/L (ref 3.5–5.1)
Sodium: 130 mmol/L — ABNORMAL LOW (ref 135–145)
Total Bilirubin: 1.6 mg/dL — ABNORMAL HIGH (ref 0.3–1.2)
Total Protein: 6.1 g/dL — ABNORMAL LOW (ref 6.5–8.1)

## 2022-10-08 LAB — PROTIME-INR
INR: 1.5 — ABNORMAL HIGH (ref 0.8–1.2)
Prothrombin Time: 18 s — ABNORMAL HIGH (ref 11.4–15.2)

## 2022-10-09 ENCOUNTER — Ambulatory Visit (HOSPITAL_COMMUNITY)
Admission: RE | Admit: 2022-10-09 | Discharge: 2022-10-09 | Disposition: A | Discharge status: Home / Self Care | Payer: Medicaid Other | Attending: Internal Medicine | Admitting: Internal Medicine

## 2022-10-09 ENCOUNTER — Other Ambulatory Visit: Payer: Self-pay

## 2022-10-09 ENCOUNTER — Encounter (HOSPITAL_COMMUNITY)
Admission: RE | Disposition: A | Discharge status: Home / Self Care | Payer: Self-pay | Source: Home / Self Care | Attending: Internal Medicine

## 2022-10-09 ENCOUNTER — Encounter (HOSPITAL_COMMUNITY): Payer: Self-pay | Admitting: Internal Medicine

## 2022-10-09 ENCOUNTER — Ambulatory Visit (HOSPITAL_COMMUNITY): Payer: Medicaid Other | Admitting: Certified Registered"

## 2022-10-09 DIAGNOSIS — J9601 Acute respiratory failure with hypoxia: Secondary | ICD-10-CM

## 2022-10-09 DIAGNOSIS — K7030 Alcoholic cirrhosis of liver without ascites: Secondary | ICD-10-CM | POA: Diagnosis present

## 2022-10-09 DIAGNOSIS — K766 Portal hypertension: Secondary | ICD-10-CM | POA: Insufficient documentation

## 2022-10-09 DIAGNOSIS — I8510 Secondary esophageal varices without bleeding: Secondary | ICD-10-CM | POA: Insufficient documentation

## 2022-10-09 DIAGNOSIS — Z87891 Personal history of nicotine dependence: Secondary | ICD-10-CM | POA: Insufficient documentation

## 2022-10-09 DIAGNOSIS — K3189 Other diseases of stomach and duodenum: Secondary | ICD-10-CM

## 2022-10-09 DIAGNOSIS — K219 Gastro-esophageal reflux disease without esophagitis: Secondary | ICD-10-CM | POA: Diagnosis not present

## 2022-10-09 HISTORY — PX: ESOPHAGOGASTRODUODENOSCOPY (EGD) WITH PROPOFOL: SHX5813

## 2022-10-09 HISTORY — PX: ESOPHAGEAL BANDING: SHX5518

## 2022-10-09 SURGERY — ESOPHAGOGASTRODUODENOSCOPY (EGD) WITH PROPOFOL
Anesthesia: General

## 2022-10-09 MED ORDER — GLYCOPYRROLATE PF 0.2 MG/ML IJ SOSY
PREFILLED_SYRINGE | INTRAMUSCULAR | Status: DC | PRN
Start: 2022-10-09 — End: 2022-10-09
  Administered 2022-10-09: .1 mg via INTRAVENOUS

## 2022-10-09 MED ORDER — LACTATED RINGERS IV SOLN: INTRAVENOUS | Status: DC | PRN

## 2022-10-09 MED ORDER — LACTATED RINGERS IV SOLN: INTRAVENOUS | Status: DC

## 2022-10-09 MED ORDER — PROPOFOL 10 MG/ML IV BOLUS
INTRAVENOUS | Status: DC | PRN
Start: 2022-10-09 — End: 2022-10-09
  Administered 2022-10-09: 100 mg via INTRAVENOUS

## 2022-10-09 MED ORDER — PHENYLEPHRINE 80 MCG/ML (10ML) SYRINGE FOR IV PUSH (FOR BLOOD PRESSURE SUPPORT)
PREFILLED_SYRINGE | INTRAVENOUS | Status: DC | PRN
Start: 2022-10-09 — End: 2022-10-09
  Administered 2022-10-09: 160 ug via INTRAVENOUS

## 2022-10-09 MED ORDER — PROPOFOL 500 MG/50ML IV EMUL
INTRAVENOUS | Status: DC | PRN
  Administered 2022-10-09: 175 ug/kg/min via INTRAVENOUS

## 2022-10-09 MED ORDER — PHENYLEPHRINE 80 MCG/ML (10ML) SYRINGE FOR IV PUSH (FOR BLOOD PRESSURE SUPPORT)
PREFILLED_SYRINGE | INTRAVENOUS | Status: AC
  Filled 2022-10-09: qty 10

## 2022-10-09 MED ORDER — DEXMEDETOMIDINE HCL IN NACL 80 MCG/20ML IV SOLN
INTRAVENOUS | Status: DC | PRN
Start: 2022-10-09 — End: 2022-10-09
  Administered 2022-10-09: 8 ug via INTRAVENOUS

## 2022-10-09 MED ORDER — LIDOCAINE HCL (CARDIAC) PF 100 MG/5ML IV SOSY
PREFILLED_SYRINGE | INTRAVENOUS | Status: DC | PRN
  Administered 2022-10-09: 100 mg via INTRAVENOUS

## 2022-10-09 MED ORDER — PROPOFOL 500 MG/50ML IV EMUL
INTRAVENOUS | Status: AC
  Filled 2022-10-09: qty 50

## 2022-10-09 MED ORDER — GLYCOPYRROLATE PF 0.2 MG/ML IJ SOSY
PREFILLED_SYRINGE | INTRAMUSCULAR | Status: AC
  Filled 2022-10-09: qty 1

## 2022-10-09 NOTE — H&P (Signed)
@LOGO @   Primary Care Physician:  Toma Deiters, MD Primary Gastroenterologist:  Dr. Jena Gauss  Pre-Procedure History & Physical: HPI:  Erica Banks is a 42 y.o. female  with end-stage EtOH related liver disease followed closely by [facility] transplant here for surveillance EGD history of esophageal variceal hemorrhage banding previously on a nonselective beta-blocker.  Presented here back in July for follow-up banding sessions she had persisting varices but stomach full of food.    procedure was aborted.  She returns today for repeat examination.   Past Medical History:  Diagnosis Date   Anemia    Cirrhosis (HCC)    DVT (deep venous thrombosis) (HCC) 2019   Low iron    Neuropathy    Neuropathy    Panic attacks    PONV (postoperative nausea and vomiting)    Pulmonary embolus (HCC) 2019    Past Surgical History:  Procedure Laterality Date   ABDOMINAL HYSTERECTOMY     BIOPSY N/A 06/24/2013   Procedure: BIOPSY TERMINAL ILEUM;  Surgeon: Corbin Ade, MD;  Location: [facility];  Service: Endoscopy;  Laterality: N/A;   CHOLECYSTECTOMY     COLONOSCOPY N/A 06/24/2013   [id number] hemorrhoids/otherwise normal    COLONOSCOPY WITH PROPOFOL N/A 05/31/2021   Surgeon: Corbin Ade, MD; ery large nonbleeding internal and external hemorrhoids, portal colopathy.  Recommended repeat colonoscopy in 10 years.   ESOPHAGEAL BANDING  03/21/2022   Procedure: ESOPHAGEAL BANDING;  Surgeon: [name], MD;  Location: [facility] ENDOSCOPY;  Service: Gastroenterology;;   ESOPHAGEAL BANDING N/A 04/19/2022   Procedure: ESOPHAGEAL BANDING;  Surgeon: Corbin Ade, MD;  Location: [facility];  Service: Endoscopy;  Laterality: N/A;   ESOPHAGOGASTRODUODENOSCOPY (EGD) WITH PROPOFOL N/A 03/29/2021   Surgeon: Lanelle Bal, DO;  grade 1 esophageal varices, portal hypertensive gastropathy with friable tissue with spontaneous ooze, normal duodenum.   ESOPHAGOGASTRODUODENOSCOPY (EGD) WITH PROPOFOL N/A  05/31/2021   Surgeon: Corbin Ade, MD;  grade 1-2 esophageal varices without bleeding stigmata, advanced appearing portal hypertensive gastropathy.   ESOPHAGOGASTRODUODENOSCOPY (EGD) WITH PROPOFOL N/A 12/22/2021   Procedure: ESOPHAGOGASTRODUODENOSCOPY (EGD) WITH PROPOFOL;  Surgeon: Dolores Frame, MD;  Location: [facility];  Service: Gastroenterology;  Laterality: N/A;   ESOPHAGOGASTRODUODENOSCOPY (EGD) WITH PROPOFOL N/A 03/21/2022   Surgeon: [name], MD;  actively bleeding esophageal varices s/p banding x 5 with bleeding resolved, portal hypertensive gastropathy.   ESOPHAGOGASTRODUODENOSCOPY (EGD) WITH PROPOFOL N/A 04/19/2022   Procedure: ESOPHAGOGASTRODUODENOSCOPY (EGD) WITH PROPOFOL;  Surgeon: Corbin Ade, MD;  Location: [facility];  Service: Endoscopy;  Laterality: N/A;  1:45 pm, pt knows to arrive at 9:45   ESOPHAGOGASTRODUODENOSCOPY (EGD) WITH PROPOFOL N/A 07/31/2022   Procedure: ESOPHAGOGASTRODUODENOSCOPY (EGD) WITH PROPOFOL;  Surgeon: Corbin Ade, MD;  Location: [facility];  Service: Endoscopy;  Laterality: N/A;  7:30 am, asa 3   IR PARACENTESIS  03/13/2022   IR PARACENTESIS  03/25/2022   nasal bone surgery     NASAL SINUS SURGERY     SINUS IRRIGATION     TONSILLECTOMY     TUBAL LIGATION      Prior to Admission medications   Medication Sig Start Date End Date Taking? Authorizing Provider  carvedilol (COREG) 6.25 MG tablet Take 1 tablet (6.25 mg total) by mouth 2 (two) times daily with a meal. 08/30/22  Yes Clearance Coots, Kristen S, PA-C  ciprofloxacin (CIPRO) 500 MG tablet Take 500 mg by mouth daily.   Yes [provider]  furosemide (LASIX) 40 MG tablet  Take 1 tablet (40 mg total) by mouth 2 (two) times daily. Patient taking differently: Take 80 mg by mouth in the morning. 03/18/22  Yes Lonia Blood, MD  gabapentin (NEURONTIN) 600 MG tablet Take 600 mg by mouth in the morning. 04/23/22  Yes [provider]  lactulose (CHRONULAC)  10 GM/15ML solution Take 30 mLs (20 g total) by mouth 3 (three) times daily. 07/04/22 07/04/23 Yes Emokpae, Courage, MD  magnesium 30 MG tablet Take 30 mg by mouth 2 (two) times daily.   Yes [provider]  metoCLOPramide (REGLAN) 10 MG tablet Take 10 mg by mouth every 6 (six) hours as needed. 12/04/21  Yes [provider]  Multiple Vitamin (MULTIVITAMIN WITH MINERALS) TABS tablet Take 1 tablet by mouth daily. 03/18/22  Yes Lonia Blood, MD  pantoprazole (PROTONIX) 40 MG tablet Take 1 tablet (40 mg total) by mouth 2 (two) times daily before a meal. 03/18/22  Yes Lonia Blood, MD  potassium chloride (KLOR-CON) 10 MEQ tablet Take 1 tablet (10 mEq total) by mouth daily. Take While taking Lasix/furosemide 07/04/22  Yes Emokpae, Courage, MD  promethazine (PHENERGAN) 12.5 MG tablet Take 12.5 mg by mouth every 6 (six) hours as needed for nausea or vomiting.   Yes [provider]  rifaximin (XIFAXAN) 550 MG TABS tablet Take 550 mg by mouth 2 (two) times daily. 02/23/22 06/27/23 Yes [provider]  spironolactone (ALDACTONE) 100 MG tablet Take 1 tablet (100 mg total) by mouth 2 (two) times daily. 05/16/22  Yes Letta Median, PA-C  oxyCODONE (ROXICODONE) 5 MG immediate release tablet Take 1 tablet (5 mg total) by mouth every 6 (six) hours as needed for severe pain. 05/30/22   Bethann Berkshire, MD    Allergies as of 09/10/2022 - Review Complete 08/30/2022  Allergen Reaction Noted   Demerol Anaphylaxis 10/12/2010    Family History  Problem Relation Age of Onset   Diabetes Mother    Hypertension Mother    COPD Mother    Cirrhosis Father        ETOH   Diabetes Sister    Hypertension Sister    Crohn's disease Maternal Grandmother    Cancer Other    Colon cancer Neg Hx    Autoimmune disease Neg Hx     Social History   Socioeconomic History   Marital status: Married    Spouse name: Not on file   Number of children: Not on file   Years of education: Not on  file   Highest education level: Not on file  Occupational History   Occupation: Unemployed    Comment: wants to go to nursing school in fall 2015  Tobacco Use   Smoking status: Former    Current packs/day: 0.50    Average packs/day: 0.5 packs/day for 12.0 years (6.0 ttl pk-yrs)    Types: Cigarettes   Smokeless tobacco: Never  Vaping Use   Vaping status: Never Used  Substance and Sexual Activity   Alcohol use: Not Currently    Alcohol/week: 4.0 - 5.0 standard drinks of alcohol    Types: 4 - 5 Cans of beer per week    Comment: None since February 2023.   Drug use: Not Currently    Types: Marijuana    Comment: Reports stopping in January 2024.   Sexual activity: Not Currently    Birth control/protection: Surgical  Other Topics Concern   Not on file  Social History Narrative   Not on file   Social  Determinants of Health   Financial Resource Strain: Low Risk  (08/07/2020)   Received [facility]   Overall Financial Resource Strain (CARDIA)    Difficulty of Paying Living Expenses: Not hard at all  Food Insecurity: No Food Insecurity (03/08/2022)   Hunger Vital Sign    Worried About Running Out of Food in the Last Year: Never true    Ran Out of Food in the Last Year: Never true  Transportation Needs: No Transportation Needs (03/08/2022)   PRAPARE - Administrator, Civil Service (Medical): No    Lack of Transportation (Non-Medical): No  Physical Activity: Inactive (10/08/2017)   Received [facility], [facility]   Exercise Vital Sign    Days of Exercise per Week: 0 days    Minutes of Exercise per Session: 0 min  Stress: Not on file  Social Connections: Not on file  Intimate Partner Violence: Not At Risk (09/10/2022)   Received from [facility]   Humiliation, Afraid, Rape, and Kick questionnaire    Fear of Current or Ex-Partner: No    Emotionally Abused: No    Physically Abused: No    Sexually Abused: No    Review of  Systems: See HPI, otherwise negative ROS  Physical Exam: BP (!) 107/59   Pulse (!) 56   Temp 98.1 F (36.7 C) (Oral)   Resp 17   LMP  (LMP Unknown)   SpO2 99%  General:   Alert,  Well-developed, well-nourished, pleasant and cooperative in NAD adenopathy. Lungs:  Clear throughout to auscultation.   No wheezes, crackles, or rhonchi. No acute distress. Heart:  Regular rate and rhythm; no murmurs, clicks, rubs,  or gallops. Abdomen: Non-distended, normal bowel sounds.  Soft and nontender without appreciable mass or hepatosplenomegaly.  Impression/Plan:    EtOH cirrhotic with known esophageal varices.  Here for follow-up EGD.  Known varices history of banding previously.  Procedure in July  aborted due to retained food contents.  The risks, benefits, limitations, alternatives and imponderables have been reviewed with the patient. Potential for esophageal dilation, biopsy, etc. have also been reviewed.  Questions have been answered. All parties agreeable.      Notice: This dictation was prepared with Dragon dictation along with smaller phrase technology. Any transcriptional errors that result from this process are unintentional and may not be corrected upon review.

## 2022-10-09 NOTE — Anesthesia Preprocedure Evaluation (Signed)
Anesthesia Evaluation  Patient identified by MRN, date of birth, ID band Patient awake    Reviewed: Allergy & Precautions, H&P , NPO status , Patient's Chart, lab work & pertinent test results, reviewed documented beta blocker date and time   History of Anesthesia Complications (+) PONV and history of anesthetic complications  Airway Mallampati: II  TM Distance: >3 FB Neck ROM: full    Dental no notable dental hx.    Pulmonary neg pulmonary ROS, former smoker   Pulmonary exam normal breath sounds clear to auscultation       Cardiovascular Exercise Tolerance: Good negative cardio ROS  Rhythm:regular Rate:Normal     Neuro/Psych   Anxiety     negative neurological ROS  negative psych ROS   GI/Hepatic negative GI ROS, Neg liver ROS,GERD  ,,  Endo/Other  negative endocrine ROS    Renal/GU Renal diseasenegative Renal ROS  negative genitourinary   Musculoskeletal   Abdominal   Peds  Hematology negative hematology ROS (+) Blood dyscrasia, anemia   Anesthesia Other Findings   Reproductive/Obstetrics negative OB ROS                             Anesthesia Physical Anesthesia Plan  ASA: 3  Anesthesia Plan: General   Post-op Pain Management:    Induction:   PONV Risk Score and Plan: Propofol infusion  Airway Management Planned:   Additional Equipment:   Intra-op Plan:   Post-operative Plan:   Informed Consent: I have reviewed the patients History and Physical, chart, labs and discussed the procedure including the risks, benefits and alternatives for the proposed anesthesia with the patient or authorized representative who has indicated his/her understanding and acceptance.     Dental Advisory Given  Plan Discussed with: CRNA  Anesthesia Plan Comments:        Anesthesia Quick Evaluation

## 2022-10-09 NOTE — Discharge Instructions (Signed)
EGD Discharge instructions Please read the instructions outlined below and refer to this sheet in the next few weeks. These discharge instructions provide you with general information on caring for yourself after you leave the hospital. Your doctor may also give you specific instructions. While your treatment has been planned according to the most current medical practices available, unavoidable complications occasionally occur. If you have any problems or questions after discharge, please call your doctor. ACTIVITY You may resume your regular activity but move at a slower pace for the next 24 hours.  Take frequent rest periods for the next 24 hours.  Walking will help expel (get rid of) the air and reduce the bloated feeling in your abdomen.  No driving for 24 hours (because of the anesthesia (medicine) used during the test).  You may shower.  Do not sign any important legal documents or operate any machinery for 24 hours (because of the anesthesia used during the test).  NUTRITION Drink plenty of fluids.  You may resume your normal diet.  Begin with a light meal and progress to your normal diet.  Avoid alcoholic beverages for 24 hours or as instructed by your caregiver.  MEDICATIONS You may resume your normal medications unless your caregiver tells you otherwise.  WHAT YOU CAN EXPECT TODAY You may experience abdominal discomfort such as a feeling of fullness or "gas" pains.  FOLLOW-UP Your doctor will discuss the results of your test with you.  SEEK IMMEDIATE MEDICAL ATTENTION IF ANY OF THE FOLLOWING OCCUR: Excessive nausea (feeling sick to your stomach) and/or vomiting.  Severe abdominal pain and distention (swelling).  Trouble swallowing.  Temperature over 101 F (37.8 C).  Rectal bleeding or vomiting of blood.      4 bands placed today   clear liquid diet today; advance to a soft diet tomorrow and then on as tolerated   follow-up appointment with Ermalinda Memos December   of  this year.    At patient request, called Dagoberto Ligas at [phone] -

## 2022-10-09 NOTE — Op Note (Signed)
[facility] Patient Name: Erica Banks Procedure Date: 10/09/2022 10:26 AM MRN: [id number] Date of Birth: 17-Mar-1980 Attending MD: Gennette Pac , MD, [id number] CSN: [id number] Age: 42 Admit Type: Outpatient Procedure:                Upper GI endoscopy Indications:              Esophageal varices Providers:                Gennette Pac, MD, Kritika Page, Dyann Ruddle, Elinor Parkinson Referring MD:              Medicines:                Propofol per Anesthesia Complications:            No immediate complications. Estimated Blood Loss:     Estimated blood loss was minimal. Procedure:                Pre-Anesthesia Assessment:                           - Prior to the procedure, a History and Physical                            was performed, and patient medications and                            allergies were reviewed. The patient's tolerance of                            previous anesthesia was also reviewed. The risks                            and benefits of the procedure and the sedation                            options and risks were discussed with the patient.                            All questions were answered, and informed consent                            was obtained. ASA Grade Assessment: III - A patient                            with severe systemic disease. After reviewing the                            risks and benefits, the patient was deemed in                            satisfactory condition to undergo the procedure.  After obtaining informed consent, the endoscope was                            passed under direct vision. Throughout the                            procedure, the patient's blood pressure, pulse, and                            oxygen saturations were monitored continuously. The                            [id number]-[id number] ([id number]) scope was introduced through the                             mouth, and advanced to the second part of duodenum.                            The upper GI endoscopy was accomplished without                            difficulty. The patient tolerated the procedure                            well. Scope In: 11:04:01 AM Scope Out: 11:12:39 AM Total Procedure Duration: 0 hours 8 minutes 38 seconds  Findings:      4 columns distal esophageal varices tiny cherry red spots clustered       together on one of the columns distally. Overlying mucosa otherwise       appeared normal.      Stomach empty. Diffuse changes of portal hypertensive gastropathy.       Patent pylorus.      The duodenal bulb and second portion of the duodenum were normal. Scope       was withdrawn and the Microvasive 7 shot bander was loaded up scope       reintroduced then into the esophagus 4 bands were deployed 1 on each       column in helical fashion starting at the GE junction.Peri Jefferson hemostasis       maintained. Patient tolerated the procedure well. Impression:               -4 columns grade 2 esophageal varices - status post                            esophageal band ligation.                           -Portal hypertensive gastropathy.                           - No specimens collected. Moderate Sedation:      Moderate (conscious) sedation was personally administered by an       anesthesia professional. The following parameters were monitored: oxygen       saturation, heart rate, blood pressure, respiratory rate, EKG, adequacy       of pulmonary ventilation,  and response to care. Recommendation:           - Patient has a contact number available for                            emergencies. The signs and symptoms of potential                            delayed complications were discussed with the                            patient. Return to normal activities tomorrow.                            Written discharge instructions were provided to the                             patient.                           - Clear liquid diet today; advance as tolerated                            tomorrow. Repeat EGD in 6 to 9 months unless                            patient has received a new liver. Office visit with                            [place] in December of this year. Procedure Code(s):        --- Professional ---                           [phone], Esophagogastroduodenoscopy, flexible,                            transoral; diagnostic, including collection of                            specimen(s) by brushing or washing, when performed                            (separate procedure) Diagnosis Code(s):        --- Professional ---                           I85.00, Esophageal varices without bleeding CPT copyright 2022 [employer]. All rights reserved. The codes documented in this report are preliminary and upon coder review may  be revised to meet current compliance requirements. Gerrit Friends. Kalyiah Saintil, MD Gennette Pac, MD 10/09/2022 11:24:41 AM This report has been signed electronically. Number of Addenda: 0

## 2022-10-09 NOTE — Transfer of Care (Addendum)
Immediate Anesthesia Transfer of Care Note  Patient: Erica Banks  Procedure(s) Performed: ESOPHAGOGASTRODUODENOSCOPY (EGD) WITH PROPOFOL ESOPHAGEAL BANDING  Patient Location: Short Stay  Anesthesia Type:General  Level of Consciousness: drowsy and patient cooperative  Airway & Oxygen Therapy: Patient Spontanous Breathing and Patient connected to nasal cannula oxygen  Post-op Assessment: Report given to RN and Post -op Vital signs reviewed and stable  Post vital signs: Reviewed and stable  Last Vitals:  Vitals Value Taken Time  BP 106/61 10/09/22   1130  Temp 36.1 10/09/22   1123  Pulse 59 10/09/22 1123  Resp 15 10/09/22 1123  SpO2 99 % 10/09/22 1123  Vitals shown include unfiled device data.  Last Pain:  Vitals:   10/09/22 1058  TempSrc:   PainSc: 0-No pain        Complications: initial blood pressure on arrival to Short Stay Recovery was 88/54. Phenylephrine 160 mcg administered. Subsequent blood pressure was 106/61.

## 2022-10-13 NOTE — Anesthesia Postprocedure Evaluation (Signed)
Anesthesia Post Note  Patient: Orlene M Fronek  Procedure(s) Performed: ESOPHAGOGASTRODUODENOSCOPY (EGD) WITH PROPOFOL ESOPHAGEAL BANDING  Patient location during evaluation: Phase II Anesthesia Type: General Level of consciousness: awake Pain management: pain level controlled Vital Signs Assessment: post-procedure vital signs reviewed and stable Respiratory status: spontaneous breathing and respiratory function stable Cardiovascular status: blood pressure returned to baseline and stable Postop Assessment: no headache and no apparent nausea or vomiting Anesthetic complications: no Comments: Late entry   No notable events documented.   Last Vitals:  Vitals:   10/09/22 1120 10/09/22 1130  BP: (!) 88/54 106/61  Pulse: 69   Resp: 17   Temp: (!) 36.1 C   SpO2: 98%     Last Pain:  Vitals:   10/10/22 1031  TempSrc:   PainSc: 0-No pain                 Windell Norfolk

## 2022-10-15 ENCOUNTER — Other Ambulatory Visit: Payer: Self-pay | Admitting: Gastroenterology

## 2022-10-15 DIAGNOSIS — K7031 Alcoholic cirrhosis of liver with ascites: Secondary | ICD-10-CM

## 2022-10-18 ENCOUNTER — Encounter (HOSPITAL_COMMUNITY): Payer: Self-pay | Admitting: Internal Medicine

## 2022-12-10 ENCOUNTER — Other Ambulatory Visit: Payer: Self-pay | Admitting: Gastroenterology

## 2022-12-23 ENCOUNTER — Encounter: Payer: Self-pay | Admitting: Gastroenterology

## 2022-12-25 ENCOUNTER — Ambulatory Visit (HOSPITAL_COMMUNITY)
Admission: RE | Admit: 2022-12-25 | Discharge: 2022-12-25 | Disposition: A | Discharge status: Home / Self Care | Payer: Medicaid Other | Source: Ambulatory Visit | Attending: Gastroenterology | Admitting: Gastroenterology

## 2022-12-25 ENCOUNTER — Encounter (HOSPITAL_COMMUNITY): Payer: Self-pay

## 2022-12-25 DIAGNOSIS — R188 Other ascites: Secondary | ICD-10-CM | POA: Diagnosis present

## 2022-12-25 DIAGNOSIS — K7460 Unspecified cirrhosis of liver: Secondary | ICD-10-CM | POA: Diagnosis present

## 2022-12-25 LAB — BODY FLUID CELL COUNT WITH DIFFERENTIAL
Eos, Fluid: 0 %
Lymphs, Fluid: 29 %
Monocyte-Macrophage-Serous Fluid: 71 % (ref 50–90)
Neutrophil Count, Fluid: 0 % (ref 0–25)
Total Nucleated Cell Count, Fluid: 185 uL (ref 0–1000)

## 2022-12-25 LAB — GRAM STAIN

## 2022-12-25 MED ORDER — LIDOCAINE HCL (PF) 2 % IJ SOLN
INTRAMUSCULAR | Status: AC
  Filled 2022-12-25: qty 10

## 2022-12-25 MED ORDER — LIDOCAINE HCL (PF) 2 % IJ SOLN
10.0000 mL | Freq: Once | INTRAMUSCULAR | Status: AC
  Administered 2022-12-25: 10 mL

## 2022-12-25 MED ORDER — LIDOCAINE HCL (PF) 2 % IJ SOLN
INTRAMUSCULAR | Status: AC
  Filled 2022-12-25: qty 20

## 2022-12-25 NOTE — Progress Notes (Signed)
Patient tolerated right sided paracentesis procedure well today and 700 mL of dark yellow ascites removed with labs collected and sent for processing. Patient verbalized understanding of discharge instructions and ambulatory at departure with no acute distress noted.

## 2022-12-30 LAB — CULTURE, BODY FLUID W GRAM STAIN -BOTTLE
Culture: NO GROWTH
Special Requests: ADEQUATE

## 2022-12-30 LAB — PATHOLOGIST SMEAR REVIEW

## 2023-01-06 ENCOUNTER — Emergency Department (HOSPITAL_COMMUNITY)
Admission: EM | Admit: 2023-01-06 | Discharge: 2023-01-07 | Disposition: A | Discharge status: Home / Self Care | Payer: Medicaid Other | Attending: Emergency Medicine | Admitting: Emergency Medicine

## 2023-01-06 ENCOUNTER — Encounter (HOSPITAL_COMMUNITY): Payer: Self-pay | Admitting: *Deleted

## 2023-01-06 ENCOUNTER — Emergency Department (HOSPITAL_COMMUNITY): Payer: Medicaid Other

## 2023-01-06 ENCOUNTER — Other Ambulatory Visit: Payer: Self-pay

## 2023-01-06 DIAGNOSIS — R109 Unspecified abdominal pain: Secondary | ICD-10-CM | POA: Diagnosis present

## 2023-01-06 DIAGNOSIS — R112 Nausea with vomiting, unspecified: Secondary | ICD-10-CM | POA: Insufficient documentation

## 2023-01-06 DIAGNOSIS — R1084 Generalized abdominal pain: Secondary | ICD-10-CM | POA: Diagnosis not present

## 2023-01-06 LAB — LIPASE, BLOOD: Lipase: 35 U/L (ref 11–51)

## 2023-01-06 LAB — COMPREHENSIVE METABOLIC PANEL
ALT: 32 U/L (ref 0–44)
AST: 70 U/L — ABNORMAL HIGH (ref 15–41)
Albumin: 3.6 g/dL (ref 3.5–5.0)
Alkaline Phosphatase: 112 U/L (ref 38–126)
Anion gap: 13 (ref 5–15)
BUN: 9 mg/dL (ref 6–20)
CO2: 23 mmol/L (ref 22–32)
Calcium: 9.4 mg/dL (ref 8.9–10.3)
Chloride: 102 mmol/L (ref 98–111)
Creatinine, Ser: 0.85 mg/dL (ref 0.44–1.00)
GFR, Estimated: >60 mL/min (ref >60)
Glucose, Bld: 119 mg/dL — ABNORMAL HIGH (ref 70–99)
Potassium: 3.3 mmol/L — ABNORMAL LOW (ref 3.5–5.1)
Sodium: 138 mmol/L (ref 135–145)
Total Bilirubin: 3.4 mg/dL — ABNORMAL HIGH (ref <1.2)
Total Protein: 6.6 g/dL (ref 6.5–8.1)

## 2023-01-06 LAB — URINALYSIS, ROUTINE W REFLEX MICROSCOPIC
Bilirubin Urine: NEGATIVE
Glucose, UA: NEGATIVE mg/dL
Hgb urine dipstick: NEGATIVE
Ketones, ur: NEGATIVE mg/dL
Leukocytes,Ua: NEGATIVE
Nitrite: NEGATIVE
Protein, ur: 30 mg/dL — AB
Specific Gravity, Urine: 1.021 (ref 1.005–1.030)
pH: 5 (ref 5.0–8.0)

## 2023-01-06 LAB — CBC
HCT: 38.1 % (ref 36.0–46.0)
Hemoglobin: 12.7 g/dL (ref 12.0–15.0)
MCH: 30 pg (ref 26.0–34.0)
MCHC: 33.3 g/dL (ref 30.0–36.0)
MCV: 90.1 fL (ref 80.0–100.0)
Platelets: 91 10*3/uL — ABNORMAL LOW (ref 150–400)
RBC: 4.23 MIL/uL (ref 3.87–5.11)
RDW: 15.2 % (ref 11.5–15.5)
WBC: 4.9 10*3/uL (ref 4.0–10.5)
nRBC: 0 % (ref 0.0–0.2)

## 2023-01-06 LAB — PROTIME-INR
INR: 1.4 — ABNORMAL HIGH (ref 0.8–1.2)
Prothrombin Time: 17.6 s — ABNORMAL HIGH (ref 11.4–15.2)

## 2023-01-06 MED ORDER — ONDANSETRON HCL 4 MG/2ML IJ SOLN
4.0000 mg | Freq: Once | INTRAMUSCULAR | Status: AC
  Administered 2023-01-06: 4 mg via INTRAVENOUS
  Filled 2023-01-06: qty 2

## 2023-01-06 MED ORDER — IOHEXOL 300 MG/ML  SOLN
100.0000 mL | Freq: Once | INTRAMUSCULAR | Status: AC | PRN
  Administered 2023-01-06: 100 mL via INTRAVENOUS

## 2023-01-06 MED ORDER — MORPHINE SULFATE (PF) 4 MG/ML IV SOLN
4.0000 mg | Freq: Once | INTRAVENOUS | Status: AC
  Administered 2023-01-06: 4 mg via INTRAVENOUS
  Filled 2023-01-06: qty 1

## 2023-01-06 NOTE — ED Triage Notes (Addendum)
Pt BIB [facility] with abd pain with N/V since 1400, emesis x 5-6 today.  Reported pt took zofran and phenergan earlier today. Pt sticking her fingers down her throat inducing vomiting in triage. Pt laid herself in the floor as well.

## 2023-01-06 NOTE — ED Provider Notes (Signed)
[place] [facility] Provider Note   CSN: [id number] Arrival date & time: 01/06/23  4401     History {Add pertinent medical, surgical, social history, OB history to HPI:1} Chief Complaint  Patient presents with   Abdominal Pain    Erica Banks is a 41 y.o. female.  Patient with a history of alcoholic cirrhosis, ascites, DVT/PE (2019), neuropathy presents with abdominal pain, nausea and vomiting since around 2:00 this afternoon. No fever. No diarrhea.   The history is provided by the patient. No language interpreter was used.  Abdominal Pain      Home Medications Prior to Admission medications   Medication Sig Start Date End Date Taking? Authorizing Provider  carvedilol (COREG) 6.25 MG tablet Take 1 tablet (6.25 mg total) by mouth 2 (two) times daily with a meal. 08/30/22   Letta Median, PA-C  ciprofloxacin (CIPRO) 500 MG tablet Take 500 mg by mouth daily.    [provider]  furosemide (LASIX) 40 MG tablet Take 1 tablet (40 mg total) by mouth 2 (two) times daily. Patient taking differently: Take 80 mg by mouth in the morning. 03/18/22   Lonia Blood, MD  gabapentin (NEURONTIN) 600 MG tablet Take 600 mg by mouth in the morning. 04/23/22   [provider]  lactulose (CHRONULAC) 10 GM/15ML solution Take 30 mLs (20 g total) by mouth 3 (three) times daily. 07/04/22 07/04/23  Shon Hale, MD  magnesium 30 MG tablet Take 30 mg by mouth 2 (two) times daily.    [provider]  metoCLOPramide (REGLAN) 10 MG tablet Take 10 mg by mouth every 6 (six) hours as needed. 12/04/21   [provider]  Multiple Vitamin (MULTIVITAMIN WITH MINERALS) TABS tablet Take 1 tablet by mouth daily. 03/18/22   Lonia Blood, MD  oxyCODONE (ROXICODONE) 5 MG immediate release tablet Take 1 tablet (5 mg total) by mouth every 6 (six) hours as needed for severe pain. 05/30/22   Bethann Berkshire, MD  pantoprazole (PROTONIX) 40 MG  tablet Take 1 tablet (40 mg total) by mouth 2 (two) times daily before a meal. 03/18/22   Lonia Blood, MD  potassium chloride (KLOR-CON) 10 MEQ tablet Take 1 tablet (10 mEq total) by mouth daily. Take While taking Lasix/furosemide 07/04/22   Shon Hale, MD  promethazine (PHENERGAN) 12.5 MG tablet Take 12.5 mg by mouth every 6 (six) hours as needed for nausea or vomiting.    [provider]  spironolactone (ALDACTONE) 100 MG tablet TAKE 1 TABLET BY MOUTH TWICE DAILY 10/15/22   Letta Median, PA-C  XIFAXAN 550 MG TABS tablet TAKE 1 TABLET BY MOUTH TWICE DAILY 12/10/22   Letta Median, PA-C      Allergies    Demerol    Review of Systems   Review of Systems  Gastrointestinal:  Positive for abdominal pain.    Physical Exam Updated Vital Signs BP 137/76   Pulse 62   Temp 97.9 F (36.6 C)   Resp 20   Ht 5\' 6"  (1.676 m)   Wt 67.6 kg   LMP  (LMP Unknown)   SpO2 100%   BMI 24.05 kg/m  Physical Exam Vitals and nursing note reviewed.  Constitutional:      Appearance: She is well-developed.  HENT:     Head: Normocephalic.  Cardiovascular:     Rate and Rhythm: Normal rate and regular rhythm.     Heart sounds: No murmur heard. Pulmonary:  Effort: Pulmonary effort is normal.     Breath sounds: Normal breath sounds. No wheezing, rhonchi or rales.  Abdominal:     General: Bowel sounds are normal.     Palpations: Abdomen is soft.     Tenderness: There is generalized abdominal tenderness.     Hernia: A hernia is present. Hernia is present in the umbilical area (Tender to palpation, soft) and ventral area (Soft, tender to palpation.).  Musculoskeletal:        General: Normal range of motion.     Cervical back: Normal range of motion and neck supple.  Skin:    General: Skin is warm and dry.  Neurological:     General: No focal deficit present.     Mental Status: She is alert and oriented to person, place, and time.     ED Results / Procedures /  Treatments   Labs (all labs ordered are listed, but only abnormal results are displayed) Labs Reviewed  COMPREHENSIVE METABOLIC PANEL - Abnormal; Notable for the following components:      Result Value   Potassium 3.3 (*)    Glucose, Bld 119 (*)    AST 70 (*)    Total Bilirubin 3.4 (*)    All other components within normal limits  CBC - Abnormal; Notable for the following components:   Platelets 91 (*)    All other components within normal limits  URINALYSIS, ROUTINE W REFLEX MICROSCOPIC - Abnormal; Notable for the following components:   Color, Urine AMBER (*)    APPearance TURBID (*)    Protein, ur 30 (*)    Bacteria, UA FEW (*)    All other components within normal limits  PROTIME-INR - Abnormal; Notable for the following components:   Prothrombin Time 17.6 (*)    INR 1.4 (*)    All other components within normal limits  LIPASE, BLOOD   Results for orders placed or performed during the hospital encounter of 01/06/23  Lipase, blood  Result Value Ref Range   Lipase 35 11 - 51 U/L  Comprehensive metabolic panel  Result Value Ref Range   Sodium 138 135 - 145 mmol/L   Potassium 3.3 (L) 3.5 - 5.1 mmol/L   Chloride 102 98 - 111 mmol/L   CO2 23 22 - 32 mmol/L   Glucose, Bld 119 (H) 70 - 99 mg/dL   BUN 9 6 - 20 mg/dL   Creatinine, Ser 4.13 0.44 - 1.00 mg/dL   Calcium 9.4 8.9 - 24.4 mg/dL   Total Protein 6.6 6.5 - 8.1 g/dL   Albumin 3.6 3.5 - 5.0 g/dL   AST 70 (H) 15 - 41 U/L   ALT 32 0 - 44 U/L   Alkaline Phosphatase 112 38 - 126 U/L   Total Bilirubin 3.4 (H) <1.2 mg/dL   GFR, Estimated >01 >02 mL/min   Anion gap 13 5 - 15  CBC  Result Value Ref Range   WBC 4.9 4.0 - 10.5 K/uL   RBC 4.23 3.87 - 5.11 MIL/uL   Hemoglobin 12.7 12.0 - 15.0 g/dL   HCT 72.5 36.6 - 44.0 %   MCV 90.1 80.0 - 100.0 fL   MCH 30.0 26.0 - 34.0 pg   MCHC 33.3 30.0 - 36.0 g/dL   RDW 34.7 42.5 - 95.6 %   Platelets 91 (L) 150 - 400 K/uL   nRBC 0.0 0.0 - 0.2 %  Urinalysis, Routine w reflex  microscopic -Urine, Clean Catch  Result Value Ref Range  Color, Urine AMBER (A) YELLOW   APPearance TURBID (A) CLEAR   Specific Gravity, Urine 1.021 1.005 - 1.030   pH 5.0 5.0 - 8.0   Glucose, UA NEGATIVE NEGATIVE mg/dL   Hgb urine dipstick NEGATIVE NEGATIVE   Bilirubin Urine NEGATIVE NEGATIVE   Ketones, ur NEGATIVE NEGATIVE mg/dL   Protein, ur 30 (A) NEGATIVE mg/dL   Nitrite NEGATIVE NEGATIVE   Leukocytes,Ua NEGATIVE NEGATIVE   RBC / HPF 0-5 0 - 5 RBC/hpf   WBC, UA 0-5 0 - 5 WBC/hpf   Bacteria, UA FEW (A) NONE SEEN   Squamous Epithelial / HPF 11-20 0 - 5 /HPF   Mucus PRESENT   Protime-INR  Result Value Ref Range   Prothrombin Time 17.6 (H) 11.4 - 15.2 seconds   INR 1.4 (H) 0.8 - 1.2    EKG None  Radiology No results found.  Procedures Procedures  {Document cardiac monitor, telemetry assessment procedure when appropriate:1}  Medications Ordered in ED Medications  morphine (PF) 4 MG/ML injection 4 mg (has no administration in time range)  ondansetron (ZOFRAN) injection 4 mg (has no administration in time range)    ED Course/ Medical Decision Making/ A&P   {   Click here for ABCD2, HEART and other calculatorsREFRESH Note before signing :1}                              Medical Decision Making This patient presents to the ED for concern of abdominal pain, this involves an extensive number of treatment options, and is a complaint that carries with it a high risk of complications and morbidity.  The differential diagnosis includes SBP, colitis, obstruction, incarcerated hernia   Co morbidities that complicate the patient evaluation  Cirrhosis, ascites (last paracentesis 5 days ago)   Additional history obtained:  Additional history and/or information obtained from chart review, notable for GI notes/follow up   Lab Tests:  I Ordered, and personally interpreted labs.  The pertinent results include:  PT 1.4 (c/w baseline); no leukocytosis, normal hemoglobin; total  bili 3.4 (c/w baseline)    Imaging Studies ordered:  I ordered imaging studies including CT abd/pel I independently visualized and interpreted imaging which showed *** I agree with the radiologist interpretation    Medicines ordered and prescription drug management:  I ordered medication including morphine  for pain; zofran for nausea Reevaluation of the patient after these medicines showed that the patient {resolved/improved/worsened:23923::"improved"} I have reviewed the patients home medicines and have made adjustments as needed   Test Considered:  Patient with history of abdominal pain, cirrhosis Ascites present but abdomen is soft  Consultations Obtained:  I requested consultation with the ***,  and discussed lab and imaging findings as well as pertinent plan - they recommend: ***   Problem List / ED Course:  ***   Reevaluation:  After the interventions noted above, I reevaluated the patient and found that they have :{resolved/improved/worsened:23923::"improved"}   Social Determinants of Health:  ***   Disposition:  After consideration of the diagnostic results and the patients response to treatment, I feel that the patient would benefit from ***.   Amount and/or Complexity of Data Reviewed Labs: ordered. Radiology: ordered.  Risk Prescription drug management.   ***  {Document critical care time when appropriate:1} {Document review of labs and clinical decision tools ie heart score, Chads2Vasc2 etc:1}  {Document your independent review of radiology images, and any outside records:1} {Document your discussion with family  members, caretakers, and with consultants:1} {Document social determinants of health affecting pt's care:1} {Document your decision making why or why not admission, treatments were needed:1} Final Clinical Impression(s) / ED Diagnoses Final diagnoses:  None    Rx / DC Orders ED Discharge Orders     None

## 2023-01-06 NOTE — ED Notes (Signed)
RN went to assess pt in waiting room who felt like she was going to "pass out". Pt laying on floor stating that she is nauseous and that her stomach hurts. RN informed pt that she would be brought back when room is avaible. No other complaints at this time.

## 2023-01-06 NOTE — ED Notes (Signed)
Patient transported to CT 

## 2023-01-07 MED ORDER — OXYCODONE HCL 5 MG PO TABS: 5.0000 mg | ORAL_TABLET | Freq: Four times a day (QID) | ORAL | 0 refills | Status: DC | PRN

## 2023-01-07 MED ORDER — ONDANSETRON 4 MG PO TBDP: 4.0000 mg | ORAL_TABLET | Freq: Three times a day (TID) | ORAL | 0 refills | Status: AC | PRN

## 2023-01-07 NOTE — Discharge Instructions (Signed)
Follow up with Dr. Jena Gauss tomorrow for recheck of abdominal pain and vomiting.

## 2023-03-05 ENCOUNTER — Encounter (INDEPENDENT_AMBULATORY_CARE_PROVIDER_SITE_OTHER): Payer: Self-pay | Admitting: *Deleted

## 2023-04-03 ENCOUNTER — Encounter: Payer: Self-pay | Admitting: Internal Medicine

## 2023-04-15 NOTE — Progress Notes (Unsigned)
 Referring Provider: Toma Deiters, MD Primary Care Physician:  Toma Deiters, MD Primary GI Physician: Dr. Jena Gauss  No chief complaint on file.   HPI:   Erica Banks is a 43 y.o. female with history of alcoholic cirrhosis complicated by recurrent ascites requiring frequent paracentesis, SBP, hepatic encephalopathy, esophageal varices with variceal bleed in February 2024 s/p banding.  She is following with [facility] for transplant consideration.  Also with history of IDA following with hematology receiving IV iron, GERD, PE, DVT, neuropathy. She is presenting today for follow-up.   Last seen in the office 08/30/2022.  She continued to have abdominal swelling, but reported it was better overall and did not feel that she needed to have paracentesis at that time.  She was taking Lasix 80 mg in the morning and spironolactone 100 mg twice daily.  Had recently undergone EGD in July which showed persistent mild esophageal varices with 1 cherry red spot, but no banding performed, and procedure aborted due to retained food.  She was taking carvedilol 3.125 mg twice daily and was tolerating this well.  No HE on lactulose and Xifaxan.  Should continue to follow compliantly with [facility] for transplant evaluation.  No other significant GI symptoms.  Reflux controlled with pantoprazole twice daily.  Recommended repeating EGD with clear liquids 24 hours prior and Reglan, increase carvedilol to 6.25 mg twice daily, continue other chronic medications, paracentesis as needed, and continue to follow with [facility].  EGD 10/09/2022: 4 columns of grade 2 esophageal varices s/p band ligation, PHG.  Recommended repeat EGD in 6-9 months.   Today: Patient was officially activated on liver transplant wait list as of 03/13/23.   Ascites/peripheral edema:   *** Diuretics:  Currently taking Lasix 80 mg in the morning and spironolactone 100 mg twice daily. *** Paracentesis:   Last paracentesis 12/25/2022 with 0.7 L of peritoneal  fluid removed.  No SBP.  Last paracentesis prior to that was in June 2024. History of SBP:   Yes. Currently on ciprofloxacin daily for prophylaxis.  Encephalopathy:    ***Taking lactulose 3 times a day and Xifaxan twice daily.  GI bleeding:  Last in Feb. 2024- variceal bleed.  She has not had any recurrent brbpr or melena.   MELD 3.0: 14 on 04/02/23 Imaging: MRI abdomen with and without contrast 04/02/2023: 2 stable hyperintense lesions in the liver measuring 1.4 cm and 0.9 cm.  No definite arterial phase enhancement.  LR-3. Recommended repeat MRI in 3 months ([facility]). AFP:   Normal 04/02/23 Hep A/B vaccination:  Immune to Hep A. Competed Hep B vaccine with [facility] (04/2022 and 06/2022) EGD:  03/21/22 actively bleeding esophageal varices s/p banding x 5 with bleeding resolved, portal hypertensive gastropathy.  04/19/22: Grade 2-3 esophageal varices s/p banding, PHG with touch friability. Recommended surveillance EGD in 6-8 weeks.   07/31/2022: Persistent mid esophageal varices with 1 cherry red spot, but no banding performed.  Procedure was aborted due to presence of food.   EGD 10/09/22 as per above.  BB:   Carvedilol 6.25 mg BID***   ETOH: None sine 02/2021.  Illicit drug use: None. Stopped marijuana around December 2023/January 2024.     GERD:  ***    Past Medical History:  Diagnosis Date   Anemia    Cirrhosis (HCC)    DVT (deep venous thrombosis) (HCC) 2019   Low iron    Neuropathy    Neuropathy    Panic attacks    PONV (postoperative nausea and  vomiting)    Pulmonary embolus (HCC) 2019    Past Surgical History:  Procedure Laterality Date   ABDOMINAL HYSTERECTOMY     BIOPSY N/A 06/24/2013   Procedure: BIOPSY TERMINAL ILEUM;  Surgeon: Corbin Ade, MD;  [facility];  Service: Endoscopy;  Laterality: N/A;   CHOLECYSTECTOMY     COLONOSCOPY N/A 06/24/2013   [id number] hemorrhoids/otherwise normal    COLONOSCOPY WITH PROPOFOL N/A 05/31/2021   Surgeon: Corbin Ade, MD; ery large nonbleeding internal and external hemorrhoids, portal colopathy.  Recommended repeat colonoscopy in 10 years.   ESOPHAGEAL BANDING  03/21/2022   Procedure: ESOPHAGEAL BANDING;  Surgeon: [name], MD;  Location: [facility] ENDOSCOPY;  Service: Gastroenterology;;   ESOPHAGEAL BANDING N/A 04/19/2022   Procedure: ESOPHAGEAL BANDING;  Surgeon: Corbin Ade, MD;  Location: [facility];  Service: Endoscopy;  Laterality: N/A;   ESOPHAGEAL BANDING N/A 10/09/2022   Procedure: ESOPHAGEAL BANDING;  Surgeon: Corbin Ade, MD;  [facility]: [facility];  Service: Endoscopy;  Laterality: N/A;   ESOPHAGOGASTRODUODENOSCOPY (EGD) WITH PROPOFOL N/A 03/29/2021   Surgeon: Lanelle Bal, DO;  grade 1 esophageal varices, portal hypertensive gastropathy with friable tissue with spontaneous ooze, normal duodenum.   ESOPHAGOGASTRODUODENOSCOPY (EGD) WITH PROPOFOL N/A 05/31/2021   Surgeon: Corbin Ade, MD;  grade 1-2 esophageal varices without bleeding stigmata, advanced appearing portal hypertensive gastropathy.   ESOPHAGOGASTRODUODENOSCOPY (EGD) WITH PROPOFOL N/A 12/22/2021   Procedure: ESOPHAGOGASTRODUODENOSCOPY (EGD) WITH PROPOFOL;  Surgeon: Dolores Frame, MD;  [facility]: [facility];  Service: Gastroenterology;  Laterality: N/A;   ESOPHAGOGASTRODUODENOSCOPY (EGD) WITH PROPOFOL N/A 03/21/2022   Surgeon: [name], MD;  actively bleeding esophageal varices s/p banding x 5 with bleeding resolved, portal hypertensive gastropathy.   ESOPHAGOGASTRODUODENOSCOPY (EGD) WITH PROPOFOL N/A 04/19/2022   Procedure: ESOPHAGOGASTRODUODENOSCOPY (EGD) WITH PROPOFOL;  Surgeon: Corbin Ade, MD;  [facility];  Service: Endoscopy;  Laterality: N/A;  1:45 pm, pt knows to arrive at 9:45   ESOPHAGOGASTRODUODENOSCOPY (EGD) WITH PROPOFOL N/A 07/31/2022   Procedure: ESOPHAGOGASTRODUODENOSCOPY (EGD) WITH PROPOFOL;  Surgeon: Corbin Ade, MD;  [facility];  Service:  Endoscopy;  Laterality: N/A;  7:30 am, asa 3   ESOPHAGOGASTRODUODENOSCOPY (EGD) WITH PROPOFOL N/A 10/09/2022   Procedure: ESOPHAGOGASTRODUODENOSCOPY (EGD) WITH PROPOFOL;  Surgeon: Corbin Ade, MD;  [facility];  Service: Endoscopy;  Laterality: N/A;  1200pm, asa 3   IR PARACENTESIS  03/13/2022   IR PARACENTESIS  03/25/2022   nasal bone surgery     NASAL SINUS SURGERY     SINUS IRRIGATION     TONSILLECTOMY     TUBAL LIGATION      Current Outpatient Medications  Medication Sig Dispense Refill   carvedilol (COREG) 6.25 MG tablet Take 1 tablet (6.25 mg total) by mouth 2 (two) times daily with a meal. 60 tablet 3   ciprofloxacin (CIPRO) 500 MG tablet Take 500 mg by mouth daily.     furosemide (LASIX) 40 MG tablet Take 1 tablet (40 mg total) by mouth 2 (two) times daily. (Patient taking differently: Take 80 mg by mouth in the morning.) 60 tablet 2   gabapentin (NEURONTIN) 600 MG tablet Take 600 mg by mouth in the morning.     lactulose (CHRONULAC) 10 GM/15ML solution Take 30 mLs (20 g total) by mouth 3 (three) times daily. 473 mL 3   magnesium 30 MG tablet Take 30 mg by mouth 2 (two) times daily.     metoCLOPramide (REGLAN)  10 MG tablet Take 10 mg by mouth every 6 (six) hours as needed.     Multiple Vitamin (MULTIVITAMIN WITH MINERALS) TABS tablet Take 1 tablet by mouth daily. 30 tablet 2   ondansetron (ZOFRAN-ODT) 4 MG disintegrating tablet Take 1 tablet (4 mg total) by mouth every 8 (eight) hours as needed for nausea or vomiting. 10 tablet 0   oxyCODONE (ROXICODONE) 5 MG immediate release tablet Take 1 tablet (5 mg total) by mouth every 6 (six) hours as needed for severe pain (pain score 7-10). 10 tablet 0   pantoprazole (PROTONIX) 40 MG tablet Take 1 tablet (40 mg total) by mouth 2 (two) times daily before a meal. 60 tablet 2   potassium chloride (KLOR-CON) 10 MEQ tablet Take 1 tablet (10 mEq total) by mouth daily. Take While taking Lasix/furosemide 30 tablet 2   promethazine  (PHENERGAN) 12.5 MG tablet Take 12.5 mg by mouth every 6 (six) hours as needed for nausea or vomiting.     spironolactone (ALDACTONE) 100 MG tablet TAKE 1 TABLET BY MOUTH TWICE DAILY 60 tablet 3   XIFAXAN 550 MG TABS tablet TAKE 1 TABLET BY MOUTH TWICE DAILY 60 tablet 5   No current facility-administered medications for this visit.    Allergies as of 04/16/2023 - Review Complete 01/06/2023  Allergen Reaction Noted   Demerol Anaphylaxis 10/12/2010    Family History  Problem Relation Age of Onset   Diabetes Mother    Hypertension Mother    COPD Mother    Cirrhosis Father        ETOH   Diabetes Sister    Hypertension Sister    Crohn's disease Maternal Grandmother    Cancer Other    Colon cancer Neg Hx    Autoimmune disease Neg Hx     Social History   Socioeconomic History   Marital status: Married    Spouse name: Not on file   Number of children: Not on file   Years of education: Not on file   Highest education level: Not on file  Occupational History   Occupation: Unemployed    Comment: wants to go to nursing school in fall 2015  Tobacco Use   Smoking status: Former    Current packs/day: 0.50    Average packs/day: 0.5 packs/day for 12.0 years (6.0 ttl pk-yrs)    Types: Cigarettes   Smokeless tobacco: Never  Vaping Use   Vaping status: Never Used  Substance and Sexual Activity   Alcohol use: Not Currently    Alcohol/week: 4.0 - 5.0 standard drinks of alcohol    Types: 4 - 5 Cans of beer per week    Comment: None since February 2023.   Drug use: Not Currently    Types: Marijuana    Comment: Reports stopping in January 2024.   Sexual activity: Not Currently    Birth control/protection: Surgical  Other Topics Concern   Not on file  Social History Narrative   Not on file   Social Drivers of Health   Financial Resource Strain: Low Risk  (08/07/2020)   Received [facility]   Overall Financial Resource Strain (CARDIA)    Difficulty of  Paying Living Expenses: Not hard at all  Food Insecurity: No Food Insecurity (03/08/2022)   Hunger Vital Sign    Worried About Running Out of Food in the Last Year: Never true    Ran Out of Food in the Last Year: Never true  Transportation Needs: No Transportation  Needs (03/08/2022)   PRAPARE - Administrator, Civil Service (Medical): No    Lack of Transportation (Non-Medical): No  Physical Activity: Inactive (10/08/2017)   Received [facility]   Exercise Vital Sign    Days of Exercise per Week: 0 days    Minutes of Exercise per Session: 0 min  Stress: Not on file  Social Connections: Not on file    Review of Systems: Gen: Denies fever, chills, anorexia. Denies fatigue, weakness, weight loss.  CV: Denies chest pain, palpitations, syncope, peripheral edema, and claudication. Resp: Denies dyspnea at rest, cough, wheezing, coughing up blood, and pleurisy. GI: Denies vomiting blood, jaundice, and fecal incontinence.   Denies dysphagia or odynophagia. Derm: Denies rash, itching, dry skin Psych: Denies depression, anxiety, memory loss, confusion. No homicidal or suicidal ideation.  Heme: Denies bruising, bleeding, and enlarged lymph nodes.  Physical Exam: LMP  (LMP Unknown)  General:   Alert and oriented. No distress noted. Pleasant and cooperative.  Head:  Normocephalic and atraumatic. Eyes:  Conjuctiva clear without scleral icterus. Heart:  S1, S2 present without murmurs appreciated. Lungs:  Clear to auscultation bilaterally. No wheezes, rales, or rhonchi. No distress.  Abdomen:  +BS, soft, non-tender and non-distended. No rebound or guarding. No HSM or masses noted. Msk:  Symmetrical without gross deformities. Normal posture. Extremities:  Without edema. Neurologic:  Alert and  oriented x4 Psych:  Normal mood and affect.    Assessment:     Plan:  ***   Ermalinda Memos, PA-C [facility] 04/16/2023\

## 2023-04-16 ENCOUNTER — Encounter: Payer: Self-pay | Admitting: Gastroenterology

## 2023-04-16 ENCOUNTER — Telehealth: Payer: Self-pay | Admitting: Gastroenterology

## 2023-04-16 ENCOUNTER — Ambulatory Visit (INDEPENDENT_AMBULATORY_CARE_PROVIDER_SITE_OTHER): Admitting: Gastroenterology

## 2023-04-16 VITALS — BP 122/77 | HR 75 | Temp 98.0°F | Ht 66.0 in | Wt 149.0 lb

## 2023-04-16 DIAGNOSIS — I8500 Esophageal varices without bleeding: Secondary | ICD-10-CM | POA: Diagnosis not present

## 2023-04-16 DIAGNOSIS — Z8619 Personal history of other infectious and parasitic diseases: Secondary | ICD-10-CM

## 2023-04-16 DIAGNOSIS — K7031 Alcoholic cirrhosis of liver with ascites: Secondary | ICD-10-CM | POA: Diagnosis not present

## 2023-04-16 DIAGNOSIS — K219 Gastro-esophageal reflux disease without esophagitis: Secondary | ICD-10-CM

## 2023-04-16 DIAGNOSIS — F1090 Alcohol use, unspecified, uncomplicated: Secondary | ICD-10-CM

## 2023-04-16 MED ORDER — CIPROFLOXACIN HCL 500 MG PO TABS: 500.0000 mg | ORAL_TABLET | Freq: Every day | ORAL | 3 refills | Status: AC

## 2023-04-16 MED ORDER — METOCLOPRAMIDE HCL 10 MG PO TABS: 10.0000 mg | ORAL_TABLET | Freq: Two times a day (BID) | ORAL | 0 refills | Status: AC

## 2023-04-16 NOTE — Telephone Encounter (Signed)
 Erica Banks/Erica Banks: Requested EGD to be arranged at today's OV/.   Due to history of retained gastric contents on a prior EGD, would like to arrange this EGD with similar instructions as the last.  Patient will need to follow a clear liquid diet 24 hours prior to EGD and take Reglan 10 mg every 12 hours x 2 doses the day prior to her EGD.

## 2023-04-16 NOTE — Patient Instructions (Signed)
 We will get you scheduled for a repeat upper endoscopy with possible esophageal variceal banding with Dr. Jena Gauss.  Continue all of your other current medications.  We will see you back in 6 months or sooner if needed.  It was great to see you again today!  Ermalinda Memos, PA-C [facility]

## 2023-04-16 NOTE — Telephone Encounter (Signed)
 error

## 2023-04-16 NOTE — Addendum Note (Signed)
 Addended by: Armstead Peaks on: 04/16/2023 02:33 PM   Modules accepted: Orders

## 2023-04-16 NOTE — Telephone Encounter (Signed)
 Instructions sent to patient and she is aware.  Per [facility] for PA "No, prior authorization is not required if the service is performed in an outpatient setting"

## 2023-04-30 ENCOUNTER — Other Ambulatory Visit: Payer: Self-pay | Admitting: Gastroenterology

## 2023-04-30 DIAGNOSIS — I8501 Esophageal varices with bleeding: Secondary | ICD-10-CM

## 2023-05-12 ENCOUNTER — Other Ambulatory Visit (HOSPITAL_COMMUNITY)
Admission: RE | Admit: 2023-05-12 | Discharge: 2023-05-12 | Disposition: A | Discharge status: Home / Self Care | Source: Ambulatory Visit | Attending: Internal Medicine | Admitting: Internal Medicine

## 2023-05-12 DIAGNOSIS — K7031 Alcoholic cirrhosis of liver with ascites: Secondary | ICD-10-CM | POA: Insufficient documentation

## 2023-05-12 LAB — BASIC METABOLIC PANEL WITH GFR
Anion gap: 10 (ref 5–15)
BUN: 14 mg/dL (ref 6–20)
CO2: 24 mmol/L (ref 22–32)
Calcium: 9.8 mg/dL (ref 8.9–10.3)
Chloride: 101 mmol/L (ref 98–111)
Creatinine, Ser: 0.93 mg/dL (ref 0.44–1.00)
GFR, Estimated: >60 mL/min (ref >60)
Glucose, Bld: 107 mg/dL — ABNORMAL HIGH (ref 70–99)
Potassium: 4.5 mmol/L (ref 3.5–5.1)
Sodium: 135 mmol/L (ref 135–145)

## 2023-05-22 ENCOUNTER — Ambulatory Visit (HOSPITAL_COMMUNITY)
Admission: RE | Admit: 2023-05-22 | Discharge: 2023-05-22 | Disposition: A | Discharge status: Home / Self Care | Attending: Internal Medicine | Admitting: Internal Medicine

## 2023-05-22 ENCOUNTER — Ambulatory Visit (HOSPITAL_COMMUNITY): Admitting: Certified Registered"

## 2023-05-22 ENCOUNTER — Encounter (HOSPITAL_COMMUNITY)
Admission: RE | Disposition: A | Discharge status: Home / Self Care | Payer: Self-pay | Source: Home / Self Care | Attending: Internal Medicine

## 2023-05-22 ENCOUNTER — Encounter (HOSPITAL_COMMUNITY): Payer: Self-pay | Admitting: Internal Medicine

## 2023-05-22 ENCOUNTER — Other Ambulatory Visit: Payer: Self-pay

## 2023-05-22 DIAGNOSIS — I8500 Esophageal varices without bleeding: Secondary | ICD-10-CM | POA: Diagnosis not present

## 2023-05-22 DIAGNOSIS — K766 Portal hypertension: Secondary | ICD-10-CM | POA: Insufficient documentation

## 2023-05-22 DIAGNOSIS — K3189 Other diseases of stomach and duodenum: Secondary | ICD-10-CM | POA: Insufficient documentation

## 2023-05-22 DIAGNOSIS — I8510 Secondary esophageal varices without bleeding: Secondary | ICD-10-CM | POA: Diagnosis present

## 2023-05-22 DIAGNOSIS — D649 Anemia, unspecified: Secondary | ICD-10-CM | POA: Insufficient documentation

## 2023-05-22 DIAGNOSIS — Z79899 Other long term (current) drug therapy: Secondary | ICD-10-CM | POA: Diagnosis not present

## 2023-05-22 DIAGNOSIS — F419 Anxiety disorder, unspecified: Secondary | ICD-10-CM | POA: Diagnosis not present

## 2023-05-22 DIAGNOSIS — K7030 Alcoholic cirrhosis of liver without ascites: Secondary | ICD-10-CM | POA: Insufficient documentation

## 2023-05-22 DIAGNOSIS — Z87891 Personal history of nicotine dependence: Secondary | ICD-10-CM | POA: Insufficient documentation

## 2023-05-22 DIAGNOSIS — K219 Gastro-esophageal reflux disease without esophagitis: Secondary | ICD-10-CM | POA: Diagnosis not present

## 2023-05-22 DIAGNOSIS — I10 Essential (primary) hypertension: Secondary | ICD-10-CM | POA: Insufficient documentation

## 2023-05-22 HISTORY — PX: ESOPHAGEAL BANDING: SHX5518

## 2023-05-22 HISTORY — PX: ESOPHAGOGASTRODUODENOSCOPY: SHX5428

## 2023-05-22 SURGERY — EGD (ESOPHAGOGASTRODUODENOSCOPY)
Anesthesia: General

## 2023-05-22 MED ORDER — PROPOFOL 1000 MG/100ML IV EMUL
INTRAVENOUS | Status: AC
  Filled 2023-05-22: qty 100

## 2023-05-22 MED ORDER — LACTATED RINGERS IV SOLN: INTRAVENOUS | Status: DC | PRN

## 2023-05-22 MED ORDER — DEXMEDETOMIDINE HCL IN NACL 80 MCG/20ML IV SOLN
INTRAVENOUS | Status: AC
  Filled 2023-05-22: qty 20

## 2023-05-22 MED ORDER — LACTATED RINGERS IV SOLN: INTRAVENOUS | Status: DC

## 2023-05-22 MED ORDER — LIDOCAINE 2% (20 MG/ML) 5 ML SYRINGE
INTRAMUSCULAR | Status: AC
Start: 2023-05-22 — End: ?
  Filled 2023-05-22: qty 5

## 2023-05-22 MED ORDER — GLYCOPYRROLATE PF 0.2 MG/ML IJ SOSY
PREFILLED_SYRINGE | INTRAMUSCULAR | Status: AC
  Filled 2023-05-22: qty 1

## 2023-05-22 MED ORDER — PHENYLEPHRINE 80 MCG/ML (10ML) SYRINGE FOR IV PUSH (FOR BLOOD PRESSURE SUPPORT)
PREFILLED_SYRINGE | INTRAVENOUS | Status: AC
  Filled 2023-05-22: qty 10

## 2023-05-22 MED ORDER — LIDOCAINE 2% (20 MG/ML) 5 ML SYRINGE
INTRAMUSCULAR | Status: DC | PRN
Start: 2023-05-22 — End: 2023-05-22
  Administered 2023-05-22: 100 mg via INTRAVENOUS

## 2023-05-22 MED ORDER — GLYCOPYRROLATE PF 0.2 MG/ML IJ SOSY
PREFILLED_SYRINGE | INTRAMUSCULAR | Status: DC | PRN
  Administered 2023-05-22: .1 mg via INTRAVENOUS

## 2023-05-22 MED ORDER — DEXMEDETOMIDINE HCL IN NACL 80 MCG/20ML IV SOLN
INTRAVENOUS | Status: DC | PRN
Start: 2023-05-22 — End: 2023-05-22
  Administered 2023-05-22: 6 ug via INTRAVENOUS

## 2023-05-22 MED ORDER — PROPOFOL 10 MG/ML IV BOLUS
INTRAVENOUS | Status: DC | PRN
  Administered 2023-05-22: 70 mg via INTRAVENOUS
  Administered 2023-05-22: 40 mg via INTRAVENOUS
  Administered 2023-05-22: 30 mg via INTRAVENOUS

## 2023-05-22 MED ORDER — PROPOFOL 500 MG/50ML IV EMUL
INTRAVENOUS | Status: DC | PRN
  Administered 2023-05-22: 150 ug/kg/min via INTRAVENOUS

## 2023-05-22 MED ORDER — PHENYLEPHRINE 80 MCG/ML (10ML) SYRINGE FOR IV PUSH (FOR BLOOD PRESSURE SUPPORT)
PREFILLED_SYRINGE | INTRAVENOUS | Status: DC | PRN
  Administered 2023-05-22: 160 ug via INTRAVENOUS
  Administered 2023-05-22 (×2): 80 ug via INTRAVENOUS

## 2023-05-22 NOTE — Anesthesia Postprocedure Evaluation (Signed)
 Anesthesia Post Note  Patient: Christella M Ontiveros  Procedure(s) Performed: EGD (ESOPHAGOGASTRODUODENOSCOPY) ESOPHAGOSCOPY, WITH ESOPHAGEAL VARICES BAND LIGATION  Patient location during evaluation: Phase II Anesthesia Type: General Level of consciousness: awake Pain management: pain level controlled Vital Signs Assessment: post-procedure vital signs reviewed and stable Respiratory status: spontaneous breathing and respiratory function stable Cardiovascular status: blood pressure returned to baseline and stable Postop Assessment: no headache and no apparent nausea or vomiting Anesthetic complications: no Comments: Late entry   No notable events documented.   Last Vitals:  Vitals:   05/22/23 0759 05/22/23 0929  BP: 106/72 (!) 105/51  Pulse: 61 74  Resp: 14 14  Temp: 36.4 C (!) 36.3 C  SpO2: 99% 98%    Last Pain:  Vitals:   05/22/23 0929  TempSrc: Oral  PainSc: 0-No pain                 Coretha Dew

## 2023-05-22 NOTE — Transfer of Care (Signed)
 Immediate Anesthesia Transfer of Care Note  Patient: Erica Banks  Procedure(s) Performed: EGD (ESOPHAGOGASTRODUODENOSCOPY) ESOPHAGOSCOPY, WITH ESOPHAGEAL VARICES BAND LIGATION  Patient Location: Endoscopy Unit  Anesthesia Type:General  Level of Consciousness: drowsy and patient cooperative  Airway & Oxygen Therapy: Patient Spontanous Breathing and Patient connected to nasal cannula oxygen  Post-op Assessment: Report given to RN and Post -op Vital signs reviewed and stable  Post vital signs: Reviewed and stable  Last Vitals:  Vitals Value Taken Time  BP 105/51 05/22/23 0929  Temp 36.3 C 05/22/23 0929  Pulse 74 05/22/23 0929  Resp 14 05/22/23 0929  SpO2 98 % 05/22/23 0929    Last Pain:  Vitals:   05/22/23 0929  TempSrc: Oral  PainSc: 0-No pain      Patients Stated Pain Goal: 6 (05/22/23 0759)  Complications: No notable events documented.

## 2023-05-22 NOTE — Discharge Instructions (Addendum)
 EGD Discharge instructions Please read the instructions outlined below and refer to this sheet in the next few weeks. These discharge instructions provide you with general information on caring for yourself after you leave the hospital. Your doctor may also give you specific instructions. While your treatment has been planned according to the most current medical practices available, unavoidable complications occasionally occur. If you have any problems or questions after discharge, please call your doctor. ACTIVITY You may resume your regular activity but move at a slower pace for the next 24 hours.  Take frequent rest periods for the next 24 hours.  Walking will help expel (get rid of) the air and reduce the bloated feeling in your abdomen.  No driving for 24 hours (because of the anesthesia (medicine) used during the test).  You may shower.  Do not sign any important legal documents or operate any machinery for 24 hours (because of the anesthesia used during the test).  NUTRITION Drink plenty of fluids.  You may resume your normal diet.  Begin with a light meal and progress to your normal diet.  Avoid alcoholic beverages for 24 hours or as instructed by your caregiver.  MEDICATIONS You may resume your normal medications unless your caregiver tells you otherwise.  WHAT YOU CAN EXPECT TODAY You may experience abdominal discomfort such as a feeling of fullness or "gas" pains.  FOLLOW-UP Your doctor will discuss the results of your test with you.  SEEK IMMEDIATE MEDICAL ATTENTION IF ANY OF THE FOLLOWING OCCUR: Excessive nausea (feeling sick to your stomach) and/or vomiting.  Severe abdominal pain and distention (swelling).  Trouble swallowing.  Temperature over 101 F (37.8 C).  Rectal bleeding or vomiting of blood.     Esophageal varices were persistent.  4 bands placed today  Recommend repeat EGD in 3 months for possible banding  Office visit with us  in 3 months if not already  scheduled.  Office will notify you.

## 2023-05-22 NOTE — Op Note (Signed)
 [facility] Patient Name: Erica Banks Procedure Date: 05/22/2023 8:48 AM MRN: [id number] Date of Birth: September 03, 1980 Attending MD: Erica Banks , MD, [id number] CSN: [id number] Age: 43 Admit Type: Outpatient Procedure:                Upper GI endoscopy Indications:              Surveillance procedure, Esophageal varices Providers:                Erica Kelp, MD, Willena Harp, [place]                            Wilson, Technician, Theola Fitch Referring MD:              Medicines:                Propofol  per Anesthesia Complications:            No immediate complications. Estimated Blood Loss:     Estimated blood loss: none. Procedure:                Pre-Anesthesia Assessment:                           - Prior to the procedure, a History and Physical                            was performed, and patient medications and                            allergies were reviewed. The patient's tolerance of                            previous anesthesia was also reviewed. The risks                            and benefits of the procedure and the sedation                            options and risks were discussed with the patient.                            All questions were answered, and informed consent                            was obtained. Prior Anticoagulants: The patient has                            taken no anticoagulant or antiplatelet agents. ASA                            Grade Assessment: III - A patient with severe                            systemic disease. After reviewing the risks and  benefits, the patient was deemed in satisfactory                            condition to undergo the procedure.                           After obtaining informed consent, the endoscope was                            passed under direct vision. Throughout the                            procedure, the patient's blood pressure, pulse, and                             oxygen saturations were monitored continuously. The                            [id number]-[id number] ([id number]) scope was introduced through the                            mouth, and advanced to the second part of duodenum.                            The upper GI endoscopy was accomplished without                            difficulty. The patient tolerated the procedure                            well. Scope In: 9:12:04 AM Scope Out: 9:24:43 AM Total Procedure Duration: 0 hours 12 minutes 39 seconds  Findings:      4 columns . 3 - grade 2; 1 grade 3 with a single cherry red spot      Severe portal hypertensive gastropathy was found in the entire examined       stomach. Poypoid antral mucosa. touch friability diffusely.      The duodenal bulb and second portion of the duodenum were normal. From       the EGJ, 4 bands placed helically - one on each column Impression:               - persisting esophageal varices. Portal                            hypertensive gastropathy. Band x 4.                           - Normal duodenal bulb and second portion of the                            duodenum.                           - No specimens collected. Moderate Sedation:      Moderate (conscious) sedation was personally administered by an  anesthesia professional. The following parameters were monitored: oxygen       saturation, heart rate, blood pressure, respiratory rate, EKG, adequacy       of pulmonary ventilation, and response to care. Recommendation:           - Patient has a contact number available for                            emergencies. The signs and symptoms of potential                            delayed complications were discussed with the                            patient. Return to normal activities tomorrow.                            Written discharge instructions were provided to the                            patient.                           - Advance diet as  tolerated.                           - Continue present medications.                           - Await pathology results.                           - Repeat upper endoscopy in 3 months for                            retreatment.                           - Return to GI clinic in 3 months. Procedure Code(s):        --- Professional ---                           [phone], Esophagogastroduodenoscopy, flexible,                            transoral; diagnostic, including collection of                            specimen(s) by brushing or washing, when performed                            (separate procedure) Diagnosis Code(s):        --- Professional ---                           K76.6, Portal hypertension  K31.89, Other diseases of stomach and duodenum                           I85.00, Esophageal varices without bleeding CPT copyright 2022 American Medical Association. All rights reserved. The codes documented in this report are preliminary and upon coder review may  be revised to meet current compliance requirements. Erica Banks. [name], MD Erica Kelp, MD 05/22/2023 9:37:29 AM This report has been signed electronically. Number of Addenda: 0

## 2023-05-22 NOTE — Anesthesia Preprocedure Evaluation (Signed)
 Anesthesia Evaluation  Patient identified by MRN, date of birth, ID band Patient awake    Reviewed: Allergy & Precautions, H&P , NPO status , Patient's Chart, lab work & pertinent test results, reviewed documented beta blocker date and time   History of Anesthesia Complications (+) PONV and history of anesthetic complications  Airway Mallampati: II  TM Distance: >3 FB Neck ROM: full    Dental no notable dental hx.    Pulmonary neg pulmonary ROS, Patient abstained from smoking., former smoker   Pulmonary exam normal breath sounds clear to auscultation       Cardiovascular Exercise Tolerance: Good hypertension, negative cardio ROS  Rhythm:regular Rate:Normal     Neuro/Psych   Anxiety     negative neurological ROS  negative psych ROS   GI/Hepatic ,GERD  ,,(+) Cirrhosis         Endo/Other  negative endocrine ROS    Renal/GU Renal disease  negative genitourinary   Musculoskeletal   Abdominal   Peds  Hematology  (+) Blood dyscrasia, anemia   Anesthesia Other Findings   Reproductive/Obstetrics negative OB ROS                             Anesthesia Physical Anesthesia Plan  ASA: 3  Anesthesia Plan: General   Post-op Pain Management:    Induction:   PONV Risk Score and Plan: Propofol  infusion  Airway Management Planned:   Additional Equipment:   Intra-op Plan:   Post-operative Plan:   Informed Consent: I have reviewed the patients History and Physical, chart, labs and discussed the procedure including the risks, benefits and alternatives for the proposed anesthesia with the patient or authorized representative who has indicated his/her understanding and acceptance.     Dental Advisory Given  Plan Discussed with: CRNA  Anesthesia Plan Comments:        Anesthesia Quick Evaluation

## 2023-05-22 NOTE — H&P (Signed)
 @LOGO @   Primary Care Physician:  Veda Gerald, MD Primary Gastroenterologist:  Dr. Riley Cheadle  Pre-Procedure History & Physical: HPI:  Erica Banks is a 43 y.o. female here for surveillance EGD and EBL as appropriate.  History of EtOH  cirrhosis.   Past Medical History:  Diagnosis Date   Anemia    Cirrhosis (HCC)    DVT (deep venous thrombosis) (HCC) 2019   Low iron     Neuropathy    Neuropathy    Panic attacks    PONV (postoperative nausea and vomiting)    Pulmonary embolus (HCC) 2019    Past Surgical History:  Procedure Laterality Date   ABDOMINAL HYSTERECTOMY     BIOPSY N/A 06/24/2013   Procedure: BIOPSY TERMINAL ILEUM;  Surgeon: Suzette Espy, MD;  Location: [facility];  Service: Endoscopy;  Laterality: N/A;   CHOLECYSTECTOMY     COLONOSCOPY N/A 06/24/2013   GNF:[id number] hemorrhoids/otherwise normal    COLONOSCOPY WITH PROPOFOL  N/A 05/31/2021   Surgeon: Suzette Espy, MD; ery large nonbleeding internal and external hemorrhoids, portal colopathy.  Recommended repeat colonoscopy in 10 years.   ESOPHAGEAL BANDING  03/21/2022   Procedure: ESOPHAGEAL BANDING;  Surgeon: Lindle Rhea, MD;  Location: [facility];  Service: Gastroenterology;;   ESOPHAGEAL BANDING N/A 04/19/2022   Procedure: ESOPHAGEAL BANDING;  Surgeon: Suzette Espy, MD;  Location: [facility];  Service: Endoscopy;  Laterality: N/A;   ESOPHAGEAL BANDING N/A 10/09/2022   Procedure: ESOPHAGEAL BANDING;  Surgeon: Suzette Espy, MD;  Location: [facility];  Service: Endoscopy;  Laterality: N/A;   ESOPHAGOGASTRODUODENOSCOPY (EGD) WITH PROPOFOL  N/A 03/29/2021   Surgeon: Vinetta Greening, DO;  grade 1 esophageal varices, portal hypertensive gastropathy with friable tissue with spontaneous ooze, normal duodenum.   ESOPHAGOGASTRODUODENOSCOPY (EGD) WITH PROPOFOL  N/A 05/31/2021   Surgeon: Suzette Espy, MD;  grade 1-2 esophageal varices without bleeding stigmata, advanced appearing portal hypertensive  gastropathy.   ESOPHAGOGASTRODUODENOSCOPY (EGD) WITH PROPOFOL  N/A 12/22/2021   Procedure: ESOPHAGOGASTRODUODENOSCOPY (EGD) WITH PROPOFOL ;  Surgeon: Urban Garden, MD;  Location: [facility];  Service: Gastroenterology;  Laterality: N/A;   ESOPHAGOGASTRODUODENOSCOPY (EGD) WITH PROPOFOL  N/A 03/21/2022   Surgeon: Lindle Rhea, MD;  actively bleeding esophageal varices s/p banding x 5 with bleeding resolved, portal hypertensive gastropathy.   ESOPHAGOGASTRODUODENOSCOPY (EGD) WITH PROPOFOL  N/A 04/19/2022   Procedure: ESOPHAGOGASTRODUODENOSCOPY (EGD) WITH PROPOFOL ;  Surgeon: Suzette Espy, MD;  Location: [facility];  Service: Endoscopy;  Laterality: N/A;  1:45 pm, pt knows to arrive at 9:45   ESOPHAGOGASTRODUODENOSCOPY (EGD) WITH PROPOFOL  N/A 07/31/2022   Procedure: ESOPHAGOGASTRODUODENOSCOPY (EGD) WITH PROPOFOL ;  Surgeon: Suzette Espy, MD;  Location: [facility];  Service: Endoscopy;  Laterality: N/A;  7:30 am, asa 3   ESOPHAGOGASTRODUODENOSCOPY (EGD) WITH PROPOFOL  N/A 10/09/2022   Procedure: ESOPHAGOGASTRODUODENOSCOPY (EGD) WITH PROPOFOL ;  Surgeon: Suzette Espy, MD;  Location: [facility];  Service: Endoscopy;  Laterality: N/A;  1200pm, asa 3   IR PARACENTESIS  03/13/2022   IR PARACENTESIS  03/25/2022   nasal bone surgery     NASAL SINUS SURGERY     SINUS IRRIGATION     TONSILLECTOMY     TUBAL LIGATION      Prior to Admission medications   Medication Sig Start Date End Date Taking? Authorizing Provider  carvedilol  (COREG ) 6.25 MG tablet TAKE 1 TABLET BY MOUTH TWICE DAILY WITH A MEAL 04/30/23  Yes April Knack, Kristen S, PA-C  ciprofloxacin  (CIPRO ) 500 MG tablet Take 1 tablet (500  mg total) by mouth daily. 04/16/23  Yes Evander Hills, PA-C  furosemide  (LASIX ) 40 MG tablet Take 1 tablet (40 mg total) by mouth 2 (two) times daily. Patient taking differently: Take 80 mg by mouth in the morning. 03/18/22  Yes Abbe Abate, MD  gabapentin  (NEURONTIN ) 600 MG tablet Take  600 mg by mouth in the morning. 04/23/22  Yes [provider]  lactulose  (CHRONULAC ) 10 GM/15ML solution Take 30 mLs (20 g total) by mouth 3 (three) times daily. 07/04/22 07/04/23 Yes Emokpae, Courage, MD  magnesium  oxide (MAG-OX) 400 (240 Mg) MG tablet Take 1 tablet by mouth 2 (two) times daily. 04/03/23  Yes [provider]  methocarbamol  (ROBAXIN ) 500 MG tablet Take by mouth daily.   Yes [provider]  Multiple Vitamin (MULTIVITAMIN WITH MINERALS) TABS tablet Take 1 tablet by mouth daily. 03/18/22  Yes Abbe Abate, MD  ondansetron  (ZOFRAN -ODT) 4 MG disintegrating tablet Take 1 tablet (4 mg total) by mouth every 8 (eight) hours as needed for nausea or vomiting. 01/07/23  Yes Upstill, Clovis Dar, PA-C  pantoprazole  (PROTONIX ) 40 MG tablet Take 1 tablet (40 mg total) by mouth 2 (two) times daily before a meal. 03/18/22  Yes Abbe Abate, MD  promethazine  (PHENERGAN ) 12.5 MG tablet Take 12.5 mg by mouth every 6 (six) hours as needed for nausea or vomiting.   Yes [provider]  spironolactone  (ALDACTONE ) 100 MG tablet TAKE 1 TABLET BY MOUTH TWICE DAILY 10/15/22  Yes Harper, Kristen S, PA-C  XIFAXAN  550 MG TABS tablet TAKE 1 TABLET BY MOUTH TWICE DAILY 12/10/22  Yes Evander Hills, PA-C  meloxicam (MOBIC) 15 MG tablet Take 15 mg by mouth daily. 03/11/23   [provider]  metoCLOPramide  (REGLAN ) 10 MG tablet Take 10 mg by mouth every 6 (six) hours as needed. 12/04/21   [provider]  metoCLOPramide  (REGLAN ) 10 MG tablet Take 1 tablet (10 mg total) by mouth every 12 (twelve) hours. 04/16/23   Evander Hills, PA-C    Allergies as of 04/16/2023 - Review Complete 04/16/2023  Allergen Reaction Noted   Demerol Anaphylaxis 10/12/2010   Meperidine Anaphylaxis and Other (See Comments) 10/12/2010    Family History  Problem Relation Age of Onset   Diabetes Mother    Hypertension Mother    COPD Mother    Cirrhosis Father        ETOH   Diabetes  Sister    Hypertension Sister    Crohn's disease Maternal Grandmother    Cancer Other    Colon cancer Neg Hx    Autoimmune disease Neg Hx     Social History   Socioeconomic History   Marital status: Married    Spouse name: Not on file   Number of children: Not on file   Years of education: Not on file   Highest education level: Not on file  Occupational History   Occupation: Unemployed    Comment: wants to go to nursing school in fall 2015  Tobacco Use   Smoking status: Former    Current packs/day: 0.50    Average packs/day: 0.5 packs/day for 12.0 years (6.0 ttl pk-yrs)    Types: Cigarettes   Smokeless tobacco: Never  Vaping Use   Vaping status: Never Used  Substance and Sexual Activity   Alcohol  use: Not Currently    Alcohol /week: 4.0 - 5.0 standard drinks of alcohol     Types: 4 - 5 Cans of beer per week    Comment:  None since February 2023.   Drug use: Not Currently    Types: Marijuana    Comment: Reports stopping in January 2024.   Sexual activity: Not Currently    Birth control/protection: Surgical  Other Topics Concern   Not on file  Social History Narrative   Not on file   Social Drivers of Health   Financial Resource Strain: Low Risk  (08/07/2020)   Received [facility]   Overall Financial Resource Strain (CARDIA)    Difficulty of Paying Living Expenses: Not hard at all  Food Insecurity: No Food Insecurity (03/08/2022)   Hunger Vital Sign    Worried About Running Out of Food in the Last Year: Never true    Ran Out of Food in the Last Year: Never true  Transportation Needs: No Transportation Needs (03/08/2022)   PRAPARE - Administrator, Civil Service (Medical): No    Lack of Transportation (Non-Medical): No  Physical Activity: Inactive (10/08/2017)   [facility]   Exercise Vital Sign    Days of Exercise per Week: 0 days    Minutes of Exercise per Session: 0 min  Stress: Not on file   Social Connections: Not on file  Intimate Partner Violence: Not At Risk (04/08/2023)   Received from [facility]   Humiliation, Afraid, Rape, and Kick questionnaire    Fear of Current or Ex-Partner: No    Emotionally Abused: No    Physically Abused: No    Sexually Abused: No    Review of Systems: See HPI, otherwise negative ROS  Physical Exam: BP 106/72   Pulse 61   Temp 97.6 F (36.4 C) (Oral)   Resp 14   Ht 5\' 6"  (1.676 m)   Wt 67.1 kg   LMP  (LMP Unknown)   SpO2 99%   BMI 23.89 kg/m  General:   Alert,  Well-developed, well-nourished, pleasant and cooperative in NAD Neck:  Supple; no masses or thyromegaly. No significant cervical adenopathy. Lungs:  Clear throughout to auscultation.   No wheezes, crackles, or rhonchi. No acute distress. Heart:  Regular rate and rhythm; no murmurs, clicks, rubs,  or gallops. Abdomen: Non-distended, normal bowel sounds.  Soft and nontender without appreciable mass or hepatosplenomegaly.   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  On a nonselective beta-blocker and has been admitted banded previously.  She is here for follow-up EGD with banding as appropriate. The risks, benefits, limitations, alternatives and imponderables have been reviewed with the patient. Potential for esophageal dilation, biopsy, etc. have also been reviewed.  Questions have been answered. All parties agreeable.      Notice: This dictation was prepared with Dragon dictation along with smaller phrase technology. Any transcriptional errors that result from this process are unintentional and may not be corrected upon review.

## 2023-05-23 ENCOUNTER — Encounter (HOSPITAL_COMMUNITY): Payer: Self-pay | Admitting: Internal Medicine

## 2023-07-01 ENCOUNTER — Other Ambulatory Visit (HOSPITAL_COMMUNITY): Payer: Self-pay | Admitting: Internal Medicine

## 2023-07-01 DIAGNOSIS — Z1231 Encounter for screening mammogram for malignant neoplasm of breast: Secondary | ICD-10-CM

## 2023-07-09 ENCOUNTER — Ambulatory Visit (HOSPITAL_COMMUNITY)
Admission: RE | Admit: 2023-07-09 | Discharge: 2023-07-09 | Disposition: A | Discharge status: Home / Self Care | Source: Ambulatory Visit | Attending: Internal Medicine | Admitting: Internal Medicine

## 2023-07-09 ENCOUNTER — Encounter (HOSPITAL_COMMUNITY): Payer: Self-pay

## 2023-07-09 DIAGNOSIS — Z1231 Encounter for screening mammogram for malignant neoplasm of breast: Secondary | ICD-10-CM | POA: Diagnosis present

## 2023-07-22 ENCOUNTER — Encounter: Payer: Self-pay | Admitting: Internal Medicine

## 2023-08-24 ENCOUNTER — Other Ambulatory Visit: Payer: Self-pay | Admitting: Gastroenterology

## 2023-09-26 ENCOUNTER — Encounter: Payer: Self-pay | Admitting: Internal Medicine

## 2023-10-26 ENCOUNTER — Emergency Department (HOSPITAL_COMMUNITY)
Admission: EM | Admit: 2023-10-26 | Discharge: 2023-10-27 | Disposition: A | Attending: Emergency Medicine | Admitting: Emergency Medicine

## 2023-10-26 ENCOUNTER — Encounter (HOSPITAL_COMMUNITY): Payer: Self-pay | Admitting: Emergency Medicine

## 2023-10-26 ENCOUNTER — Other Ambulatory Visit: Payer: Self-pay

## 2023-10-26 DIAGNOSIS — J90 Pleural effusion, not elsewhere classified: Secondary | ICD-10-CM | POA: Insufficient documentation

## 2023-10-26 DIAGNOSIS — I81 Portal vein thrombosis: Secondary | ICD-10-CM | POA: Insufficient documentation

## 2023-10-26 DIAGNOSIS — R1084 Generalized abdominal pain: Secondary | ICD-10-CM | POA: Diagnosis present

## 2023-10-26 DIAGNOSIS — E876 Hypokalemia: Secondary | ICD-10-CM | POA: Diagnosis not present

## 2023-10-26 DIAGNOSIS — K625 Hemorrhage of anus and rectum: Secondary | ICD-10-CM | POA: Diagnosis not present

## 2023-10-26 NOTE — ED Triage Notes (Signed)
 Pt c/o abd pain since hernia repair Monday. Pt c/o rectal bleeding and vomiting since last night.

## 2023-10-27 ENCOUNTER — Emergency Department (HOSPITAL_COMMUNITY)

## 2023-10-27 LAB — BASIC METABOLIC PANEL WITH GFR
Anion gap: 13 (ref 5–15)
BUN: 10 mg/dL (ref 6–20)
CO2: 19 mmol/L — ABNORMAL LOW (ref 22–32)
Calcium: 7.7 mg/dL — ABNORMAL LOW (ref 8.9–10.3)
Chloride: 99 mmol/L (ref 98–111)
Creatinine, Ser: 0.69 mg/dL (ref 0.44–1.00)
GFR, Estimated: >60 mL/min (ref >60)
Glucose, Bld: 100 mg/dL — ABNORMAL HIGH (ref 70–99)
Potassium: 3 mmol/L — ABNORMAL LOW (ref 3.5–5.1)
Sodium: 131 mmol/L — ABNORMAL LOW (ref 135–145)

## 2023-10-27 LAB — COMPREHENSIVE METABOLIC PANEL WITH GFR
ALT: 25 U/L (ref 0–44)
AST: 35 U/L (ref 15–41)
Albumin: 2.8 g/dL — ABNORMAL LOW (ref 3.5–5.0)
Alkaline Phosphatase: 92 U/L (ref 38–126)
Anion gap: 14 (ref 5–15)
BUN: 10 mg/dL (ref 6–20)
CO2: 21 mmol/L — ABNORMAL LOW (ref 22–32)
Calcium: 8.3 mg/dL — ABNORMAL LOW (ref 8.9–10.3)
Chloride: 100 mmol/L (ref 98–111)
Creatinine, Ser: 0.71 mg/dL (ref 0.44–1.00)
GFR, Estimated: >60 mL/min (ref >60)
Glucose, Bld: 128 mg/dL — ABNORMAL HIGH (ref 70–99)
Potassium: 2.8 mmol/L — ABNORMAL LOW (ref 3.5–5.1)
Sodium: 135 mmol/L (ref 135–145)
Total Bilirubin: 2.2 mg/dL — ABNORMAL HIGH (ref 0.0–1.2)
Total Protein: 5.5 g/dL — ABNORMAL LOW (ref 6.5–8.1)

## 2023-10-27 LAB — CBC WITH DIFFERENTIAL/PLATELET
Basophils Absolute: 0.1 K/uL (ref 0.0–0.1)
Basophils Relative: 1 %
Eosinophils Absolute: 0.1 K/uL (ref 0.0–0.5)
Eosinophils Relative: 1 %
HCT: 27.1 % — ABNORMAL LOW (ref 36.0–46.0)
Hemoglobin: 9 g/dL — ABNORMAL LOW (ref 12.0–15.0)
Lymphocytes Relative: 36 %
Lymphs Abs: 3.6 K/uL (ref 0.7–4.0)
MCH: 28.4 pg (ref 26.0–34.0)
MCHC: 33.2 g/dL (ref 30.0–36.0)
MCV: 85.5 fL (ref 80.0–100.0)
Monocytes Absolute: 0.3 K/uL (ref 0.1–1.0)
Monocytes Relative: 3 %
Neutro Abs: 5.8 K/uL (ref 1.7–7.7)
Neutrophils Relative %: 59 %
Platelets: 140 K/uL — ABNORMAL LOW (ref 150–400)
RBC: 3.17 MIL/uL — ABNORMAL LOW (ref 3.87–5.11)
RDW: 14.8 % (ref 11.5–15.5)
WBC: 9.9 K/uL (ref 4.0–10.5)
nRBC: 0 % (ref 0.0–0.2)

## 2023-10-27 LAB — PROTIME-INR
INR: 1.3 — ABNORMAL HIGH (ref 0.8–1.2)
Prothrombin Time: 17.1 s — ABNORMAL HIGH (ref 11.4–15.2)

## 2023-10-27 LAB — LACTIC ACID, PLASMA: Lactic Acid, Venous: 1.2 mmol/L (ref 0.5–1.9)

## 2023-10-27 LAB — MAGNESIUM: Magnesium: 1.6 mg/dL — ABNORMAL LOW (ref 1.7–2.4)

## 2023-10-27 MED ORDER — PIPERACILLIN-TAZOBACTAM 3.375 G IVPB 30 MIN
3.3750 g | Freq: Once | INTRAVENOUS | Status: AC
  Administered 2023-10-27: 3.375 g via INTRAVENOUS
  Filled 2023-10-27: qty 50

## 2023-10-27 MED ORDER — MORPHINE SULFATE (PF) 4 MG/ML IV SOLN
4.0000 mg | Freq: Once | INTRAVENOUS | Status: AC
  Administered 2023-10-27: 4 mg via INTRAVENOUS
  Filled 2023-10-27: qty 1

## 2023-10-27 MED ORDER — MORPHINE SULFATE (PF) 2 MG/ML IV SOLN
2.0000 mg | INTRAVENOUS | Status: DC | PRN
  Administered 2023-10-27 (×4): 2 mg via INTRAVENOUS
  Filled 2023-10-27 (×4): qty 1

## 2023-10-27 MED ORDER — POTASSIUM CHLORIDE 10 MEQ/100ML IV SOLN
10.0000 meq | Freq: Once | INTRAVENOUS | Status: AC
  Administered 2023-10-27: 10 meq via INTRAVENOUS
  Filled 2023-10-27: qty 100

## 2023-10-27 MED ORDER — ONDANSETRON HCL 4 MG/2ML IJ SOLN
4.0000 mg | Freq: Once | INTRAMUSCULAR | Status: AC
  Administered 2023-10-27: 4 mg via INTRAVENOUS
  Filled 2023-10-27: qty 2

## 2023-10-27 MED ORDER — MORPHINE SULFATE (PF) 2 MG/ML IV SOLN
2.0000 mg | Freq: Once | INTRAVENOUS | Status: AC
  Administered 2023-10-27: 2 mg via INTRAVENOUS
  Filled 2023-10-27: qty 1

## 2023-10-27 MED ORDER — MAGNESIUM SULFATE 2 GM/50ML IV SOLN
2.0000 g | Freq: Once | INTRAVENOUS | Status: AC
  Administered 2023-10-27: 2 g via INTRAVENOUS
  Filled 2023-10-27: qty 50

## 2023-10-27 MED ORDER — IOHEXOL 300 MG/ML  SOLN
100.0000 mL | Freq: Once | INTRAMUSCULAR | Status: AC | PRN
  Administered 2023-10-27: 100 mL via INTRAVENOUS

## 2023-10-27 MED ORDER — SODIUM CHLORIDE 0.9 % IV SOLN: Freq: Once | INTRAVENOUS | Status: AC

## 2023-10-27 NOTE — ED Notes (Signed)
 Lab tech on way to draw lactic acid- pt has IV K+ running in PIV at this time, unable to draw blood off IV at this time.

## 2023-10-27 NOTE — ED Notes (Addendum)
 SABRA

## 2023-10-27 NOTE — ED Provider Notes (Signed)
 EKG Interpretation Date/Time:  Monday October 27 2023 07:12:53 EDT Ventricular Rate:  87 PR Interval:  119 QRS Duration:  77 QT Interval:  433 QTC Calculation: 521 R Axis:   63  Text Interpretation: Sinus rhythm Borderline short PR interval Prolonged QT interval Confirmed by Midge Golas ([id number]) on 10/27/2023 7:15:49 AM       Pt with U wave, mildly prolonged QT - will give more potassium   Midge Golas, MD 10/27/23 [phone]

## 2023-10-27 NOTE — ED Provider Notes (Signed)
 Transport team is here.  Patient is stable for transfer.   Erica Hamilton, MD 10/27/23 [phone]

## 2023-10-27 NOTE — ED Notes (Signed)
 ED Provider at bedside.

## 2023-10-27 NOTE — ED Notes (Signed)
 Update given to [facility] @ [facility]

## 2023-10-27 NOTE — ED Provider Notes (Signed)
 [place] [facility] DE[facility]MENT AT [facility] Provider Note   CSN: [id number] Arrival date & time: 10/26/23  2312     Patient presents with: Abdominal Pain   Erica Banks is a 43 y.o. female.   The history is provided by the patient.  Patient history of cirrhosis, on the transplant list, history of VTE presents for abdominal pain, vomiting, diarrhea and rectal bleeding.  Patient underwent surgery around a week ago for hernia repair.  Over the past several days she has had increasing generalized abdominal pain, nonbloody emesis and multiple bouts of liquid stool with blood mixed in.  No melena.  She suspects the rectal bleeding is due to hemorrhoids     Past Medical History:  Diagnosis Date   Anemia    Cirrhosis (HCC)    DVT (deep venous thrombosis) (HCC) 2019   Low iron     Neuropathy    Neuropathy    Panic attacks    PONV (postoperative nausea and vomiting)    Pulmonary embolus (HCC) 2019    Prior to Admission medications   Medication Sig Start Date End Date Taking? Authorizing Provider  carvedilol  (COREG ) 6.25 MG tablet TAKE 1 TABLET BY MOUTH TWICE DAILY WITH A MEAL 04/30/23   Rudy Josette RAMAN, PA-C  ciprofloxacin  (CIPRO ) 500 MG tablet Take 1 tablet (500 mg total) by mouth daily. 04/16/23   Rudy Josette RAMAN, PA-C  furosemide  (LASIX ) 40 MG tablet Take 1 tablet (40 mg total) by mouth 2 (two) times daily. Patient taking differently: Take 80 mg by mouth in the morning. 03/18/22   Danton Reyes DASEN, MD  gabapentin  (NEURONTIN ) 600 MG tablet Take 600 mg by mouth in the morning. 04/23/22   [provider]  magnesium  oxide (MAG-OX) 400 (240 Mg) MG tablet Take 1 tablet by mouth 2 (two) times daily. 04/03/23   [provider]  methocarbamol  (ROBAXIN ) 500 MG tablet Take by mouth daily.    [provider]  metoCLOPramide  (REGLAN ) 10 MG tablet Take 10 mg by mouth every 6 (six) hours as needed. 12/04/21   [provider]  metoCLOPramide   (REGLAN ) 10 MG tablet Take 1 tablet (10 mg total) by mouth every 12 (twelve) hours. 04/16/23   Rudy Josette RAMAN, PA-C  Multiple Vitamin (MULTIVITAMIN WITH MINERALS) TABS tablet Take 1 tablet by mouth daily. 03/18/22   Danton Reyes DASEN, MD  ondansetron  (ZOFRAN -ODT) 4 MG disintegrating tablet Take 1 tablet (4 mg total) by mouth every 8 (eight) hours as needed for nausea or vomiting. 01/07/23   Odell Balls, PA-C  pantoprazole  (PROTONIX ) 40 MG tablet Take 1 tablet (40 mg total) by mouth 2 (two) times daily before a meal. 03/18/22   Danton Reyes DASEN, MD  promethazine  (PHENERGAN ) 12.5 MG tablet Take 12.5 mg by mouth every 6 (six) hours as needed for nausea or vomiting.    [provider]  rifaximin  (XIFAXAN ) 550 MG TABS tablet TAKE 1 TABLET BY MOUTH TWICE DAILY 08/27/23   Harper, Kristen S, PA-C  spironolactone  (ALDACTONE ) 100 MG tablet TAKE 1 TABLET BY MOUTH TWICE DAILY 10/15/22   Harper, Kristen S, PA-C    Allergies: Demerol and Meperidine    Review of Systems  Constitutional:  Negative for fever.  Gastrointestinal:  Positive for abdominal pain, blood in stool, diarrhea, nausea and vomiting.    Updated Vital Signs BP 120/75   Pulse 86   Temp 98.5 F (36.9 C) (Oral)   Resp 13   Ht 1.676 m (5' 6)  Wt 68 kg   LMP  (LMP Unknown)   SpO2 94%   BMI 24.21 kg/m   Physical Exam CONSTITUTIONAL: Uncomfortable appearing HEAD: Normocephalic/atraumatic EYES: EOMI/PERRL ENMT: Mucous membranes moist NECK: supple no meningeal signs CV: S1/S2 noted, no murmurs/rubs/gallops noted LUNGS: Lungs are clear to auscultation bilaterally, no apparent distress ABDOMEN: soft, nondistended, mild diffuse tenderness, mild bruising noted, no rebound or guarding Rectal-extensive external hemorrhoids noted, no blood or melena noted Erica Banks presents NEURO: Pt is awake/alert/appropriate, moves all extremitiesx4.  No facial droop.   SKIN: warm, color normal PSYCH: no abnormalities of mood noted, alert  and oriented to situation  (all labs ordered are listed, but only abnormal results are displayed) Labs Reviewed  COMPREHENSIVE METABOLIC PANEL WITH GFR - Abnormal; Notable for the following components:      Result Value   Potassium 2.8 (*)    CO2 21 (*)    Glucose, Bld 128 (*)    Calcium  8.3 (*)    Total Protein 5.5 (*)    Albumin  2.8 (*)    Total Bilirubin 2.2 (*)    All other components within normal limits  CBC WITH DIFFERENTIAL/PLATELET - Abnormal; Notable for the following components:   RBC 3.17 (*)    Hemoglobin 9.0 (*)    HCT 27.1 (*)    Platelets 140 (*)    All other components within normal limits  PROTIME-INR - Abnormal; Notable for the following components:   Prothrombin Time 17.1 (*)    INR 1.3 (*)    All other components within normal limits  LACTIC ACID, PLASMA  BASIC METABOLIC PANEL WITH GFR    EKG: None  Radiology: CT ABDOMEN PELVIS W CONTRAST Result Date: 10/27/2023 EXAM: CT ABDOMEN AND PELVIS WITH CONTRAST 10/27/2023 01:04:06 AM TECHNIQUE: CT of the abdomen and pelvis was performed with the administration of 100 mL of iohexol  (OMNIPAQUE ) 300 MG/ML solution. Multiplanar reformatted images are provided for review. Automated exposure control, iterative reconstruction, and/or weight-based adjustment of the mA/kV was utilized to reduce the radiation dose to as low as reasonably achievable. COMPARISON: None available. CLINICAL HISTORY: Abdominal pain, acute, nonlocalized. c/o abd pain since hernia repair Monday. Pt c/o rectal bleeding and vomiting since last night ; No oral per provider. FINDINGS: LOWER CHEST: Large right pleural effusion and associated atelectasis. LIVER: Cirrhosis. Heterogeneous nodular enhancement throughout the liver. Exophytic enhancing nodule on the inferior right liver on series 2 image 40 measuring 12 mm. Hepatic protocol MRI is recommended for further evaluation. Recanalized Periumbilical Vein. GALLBLADDER AND BILE DUCTS: Gallbladder is  unremarkable. No biliary ductal dilatation. SPLEEN: Splenomegaly. PANCREAS: No acute abnormality. ADRENAL GLANDS: No acute abnormality. KIDNEYS, URETERS AND BLADDER: No stones in the kidneys or ureters. No hydronephrosis. No perinephric or periureteral stranding. Urinary bladder is unremarkable. GI AND BOWEL: Diffuse gastric and duodenal wall thickening. Diffuse colonic wall thickening greatest about the cecum and ascending colon. Normal appendix. PERITONEUM AND RETROPERITONEUM: Mesenteric edema and trace abdominopelvic ascites. No free air. VASCULATURE: Filling defect in the distal main portal vein extending into the right and left portal veins compatible with thrombus. Acute thrombus is nearly occlusive in the left portal vein. Aorta is normal in caliber. LYMPH NODES: No lymphadenopathy. REPRODUCTIVE ORGANS: No acute abnormality. BONES AND SOFT TISSUES: Interval postoperative change of abdominal wall hernia repair. There is fluid and gas in the upper ventral abdominal wall. No acute osseous abnormality. IMPRESSION: 1. Thrombus in the main portal vein extending into the right and left portal veins. Near-occlusive acute thrombus in the  left portal vein. 2. Diffuse gastric, duodenal, and colonic wall thickening. This is favored due to portal venous congestion. Inflammation or ischemia could appear similarly. 3. Cirrhosis with heterogeneous nodular liver enhancement and a 12 mm exophytic enhancing nodule in the inferior right lobe; recommend nonemergent hepatic protocol MRI for further evaluation. 4. Large right pleural effusion with associated atelectasis. 5. Postoperative changes of abdominal wall hernia repair with fluid and gas in the upper ventral abdominal wall. 6. CRITICAL VALUE/EMERGENT RESULTS WERE CALLED BY TELEPHONE AT THE TIME OF INTERPRETATION ON 10/27/2023 at 01:30 am TO PROVIDER Dr. Midge, [facility] VERBALLY ACKNOWLEDGED THESE RESULTS. Electronically signed by: Norman Gatlin MD 10/27/2023 01:29 AM EDT RP  Workstation: [id number]     .Critical Care  Performed by: Midge Golas, MD Authorized by: Midge Golas, MD   Critical care provider statement:    Critical care time (minutes):  80   Critical care start time:  10/27/2023 12:00 AM   Critical care end time:  10/27/2023 1:50 AM   Critical care time was exclusive of:  Separately billable procedures and treating other patients   Critical care was necessary to treat or prevent imminent or life-threatening deterioration of the following conditions:  Shock and sepsis   Critical care was time spent personally by me on the following activities:  Obtaining history from patient or surrogate, re-evaluation of patient's condition, review of old charts, ordering and review of laboratory studies, ordering and review of radiographic studies, ordering and performing treatments and interventions, discussions with consultants, evaluation of patient's response to treatment, examination of patient, development of treatment plan with patient or surrogate and pulse oximetry   I assumed direction of critical care for this patient from another provider in my specialty: no     Care discussed with: admitting provider      Medications Ordered in the ED  morphine  (PF) 2 MG/ML injection 2 mg (2 mg Intravenous Given 10/27/23 0606)  morphine  (PF) 2 MG/ML injection 2 mg (2 mg Intravenous Given 10/27/23 0040)  ondansetron  (ZOFRAN ) injection 4 mg (4 mg Intravenous Given 10/27/23 0040)  iohexol  (OMNIPAQUE ) 300 MG/ML solution 100 mL (100 mLs Intravenous Contrast Given 10/27/23 0055)  potassium chloride  10 mEq in 100 mL IVPB (0 mEq Intravenous Stopped 10/27/23 0223)  morphine  (PF) 4 MG/ML injection 4 mg (4 mg Intravenous Given 10/27/23 0201)  potassium chloride  10 mEq in 100 mL IVPB (0 mEq Intravenous Stopped 10/27/23 0323)  piperacillin-tazobactam (ZOSYN) IVPB 3.375 g (0 g Intravenous Stopped 10/27/23 0456)  0.9 %  sodium chloride  infusion ( Intravenous New Bag/Given 10/27/23  0330)    Clinical Course as of 10/27/23 0707  Mon Oct 27, 2023  0041 Patient with extensive history presents for abdominal pain, vomiting and diarrhea with bloody stool.  Patient with recent laparoscopic hernia repair.  Patient is ill-appearing.  She is not actively bleeding at this time.  Imaging and labs are pending [DW]  0042 Hemoglobin(!): 9.0 Anemia [DW]  0042 Potassium(!): 2.8 Hypokalemia [DW]  0137 Spoke to radiology, patient has acute large portal vein thrombus.  However patient is at risk for GI bleed as he has known history of varices with recent bleeding Patient is currently stable.  Will contact her specialist at [facility] [DW]  0145 Discussed the case with the [facility] transfer center, they are going to help arrange transfer [DW]  0228 I had a long discussion with Dr. Manuelita Novak, hepatologist at [facility].  She has reviewed the imaging.  We discussed this case at length.  She will accept the patient in transfer.  She has to determine the exact disposition in the hospital She recommends IV Zosyn and to check a lactate Due to her recent rectal bleeding and drop in her hemoglobin, will defer anticoagulation for now. Clinically patient is hemodynamically appropriate with a nonsurgical abdomen We will standby for further transfer instructions [DW]  0300 Patient & fianc have been updated on plan She is awake and alert but appears more comfortable Transport will likely take place after 7 AM to [facility]  [DW]  [phone] Patient stable in the ER, slept through the night.  Awaiting transfer.  Signed out to Dr. Freddi with transfer pending.  Will recheck electrolytes.   [DW]    Clinical Course User Index [DW] Midge Golas, MD                                 Medical Decision Making Amount and/or Complexity of Data Reviewed Labs: ordered. Decision-making details documented in ED Course. Radiology: ordered.  Risk Prescription drug management.   This patient  presents to the ED for concern of abodminal pain, this involves an extensive number of treatment options, and is a complaint that carries with it a high risk of complications and morbidity.  The differential diagnosis includes but is not limited to cholecystitis, cholelithiasis, pancreatitis, gastritis, peptic ulcer disease, appendicitis, bowel obstruction, bowel perforation, diverticulitis, AAA, ischemic bowel, postoperative complications, SBP, UTI, pyelonephritis    Comorbidities that complicate the patient evaluation: Patient's presentation is complicated by their history of cirrhosis   Additional history obtained: Additional history obtained from spouse Records reviewed Care Everywhere/External Records  Lab Tests: I Ordered, and personally interpreted labs.  The pertinent results include: Acute on chronic anemia, hypokalemia  Imaging Studies ordered: I ordered imaging studies including CT scan abdomen pelvis  I independently visualized and interpreted imaging which showed portal vein thrombosis I agree with the radiologist interpretation  Medicines ordered and prescription drug management: I ordered medication including morphine  for pain Reevaluation of the patient after these medicines showed that the patient    improved IV potassium for hypokalemia  Critical Interventions:   IV potassium for hypokalemia, transfer to [facility] for management of portal vein thrombosis  Consultations Obtained: I requested consultation with the radiologist   and consultant Dr. Myrna , and discussed  findings as well as pertinent plan - they recommend: Patient found to have portal vein thrombosis, will be transferred to [facility]  Reevaluation: After the interventions noted above, I reevaluated the patient and found that they have :stayed the same  Complexity of problems addressed: Patient's presentation is most consistent with  acute presentation with potential threat to life or bodily  function  Disposition: After consideration of the diagnostic results and the patient's response to treatment,  I feel that the patent would benefit from admission  .        Final diagnoses:  Portal vein thrombosis  Hypokalemia  Pleural effusion    ED Discharge Orders     None          Midge Golas, MD 10/27/23 [phone]

## 2023-10-27 NOTE — ED Notes (Signed)
 Pt ambulatory to restroom

## 2023-11-12 ENCOUNTER — Encounter (INDEPENDENT_AMBULATORY_CARE_PROVIDER_SITE_OTHER): Payer: Self-pay | Admitting: Gastroenterology

## 2023-11-14 ENCOUNTER — Telehealth: Payer: Self-pay | Admitting: *Deleted

## 2023-11-14 ENCOUNTER — Other Ambulatory Visit: Payer: Self-pay | Admitting: Gastroenterology

## 2023-11-14 ENCOUNTER — Other Ambulatory Visit: Payer: Self-pay | Admitting: *Deleted

## 2023-11-14 DIAGNOSIS — R188 Other ascites: Secondary | ICD-10-CM

## 2023-11-14 NOTE — Telephone Encounter (Signed)
 Pt informed of providers message and recommendations. Pt verbalized understanding. Pt informed has been scheduled for Monday 11/17/23, arrive at 10:45 am to check in.

## 2023-11-14 NOTE — Telephone Encounter (Signed)
 Noted

## 2023-11-14 NOTE — Telephone Encounter (Signed)
 Pt called and states that her stomach is swollen and she needs a paracentesis. She states that she is uncomfortable.

## 2023-11-14 NOTE — Telephone Encounter (Signed)
 Pt called and wanted to get standing orders for para. She states  I have accumulated right much fluid on my stomach. She was last seen in office on 04/16/23 and has upcoming appointment on 12/01/23. Please advise. Thank you

## 2023-11-17 ENCOUNTER — Ambulatory Visit (HOSPITAL_COMMUNITY): Attending: Gastroenterology

## 2023-11-17 ENCOUNTER — Ambulatory Visit: Admitting: Internal Medicine

## 2023-11-25 ENCOUNTER — Telehealth: Payer: Self-pay

## 2023-11-25 NOTE — Telephone Encounter (Signed)
 Pt called worried about obtaining her Xifaxan . Insurance has denied PA due to pharmaceutical company not participating in [employer]. Only options available for pt at this time is to try to do an appeal letter through [employer] or try to get approved for pt assistance. Pt was a pt of [employer].

## 2023-11-25 NOTE — Telephone Encounter (Signed)
 Lmom notifying pt to bring income verification with her to her appointment to submit along with patient assistance forms.

## 2023-11-25 NOTE — Telephone Encounter (Signed)
 Let's get her patient assistance.

## 2023-11-26 ENCOUNTER — Telehealth: Payer: Self-pay | Admitting: *Deleted

## 2023-11-26 NOTE — Telephone Encounter (Signed)
 Received a appeal request for medication. It was scanned into chart.

## 2023-11-26 NOTE — Telephone Encounter (Signed)
 Erica Banks is suggesting patient assistance forms to be filled out.

## 2023-11-27 NOTE — Telephone Encounter (Signed)
 I'm not sure who to send this to, as she was last seen by Josette and actually has an upcoming appointment with Dr. Shaaron.  Courtney and Tammy: just needs patient assistance started. Appealing will most likely be unproductive as the insurance simply does not have this as a benefit.

## 2023-11-27 NOTE — Telephone Encounter (Signed)
 Insurance appeal was approved. Pt is able to get Xifaxan  through pharmacy.

## 2023-11-28 NOTE — Telephone Encounter (Signed)
 Noted

## 2023-11-28 NOTE — Telephone Encounter (Signed)
 Awesome! Let me know how you did this so we can know for next time!

## 2023-12-01 ENCOUNTER — Ambulatory Visit: Admitting: Internal Medicine

## 2023-12-01 NOTE — Telephone Encounter (Signed)
 It was done by another one of her providers.

## 2023-12-02 ENCOUNTER — Encounter: Payer: Self-pay | Admitting: Internal Medicine
